# Patient Record
Sex: Female | Born: 1935
Health system: Southern US, Community
[De-identification: ages and names within clinical notes are randomized; demographics above are authoritative.]

## PROBLEM LIST (undated history)

## (undated) DIAGNOSIS — I5032 Chronic diastolic (congestive) heart failure: Secondary | ICD-10-CM

## (undated) DIAGNOSIS — I10 Essential (primary) hypertension: Secondary | ICD-10-CM

## (undated) DIAGNOSIS — R21 Rash and other nonspecific skin eruption: Secondary | ICD-10-CM

## (undated) DIAGNOSIS — E785 Hyperlipidemia, unspecified: Secondary | ICD-10-CM

## (undated) DIAGNOSIS — E039 Hypothyroidism, unspecified: Secondary | ICD-10-CM

## (undated) DIAGNOSIS — R0602 Shortness of breath: Secondary | ICD-10-CM

## (undated) DIAGNOSIS — I2510 Atherosclerotic heart disease of native coronary artery without angina pectoris: Secondary | ICD-10-CM

## (undated) DIAGNOSIS — T7840XA Allergy, unspecified, initial encounter: Secondary | ICD-10-CM

## (undated) DIAGNOSIS — I350 Nonrheumatic aortic (valve) stenosis: Secondary | ICD-10-CM

## (undated) DIAGNOSIS — J9600 Acute respiratory failure, unspecified whether with hypoxia or hypercapnia: Secondary | ICD-10-CM

## (undated) DIAGNOSIS — M1990 Unspecified osteoarthritis, unspecified site: Secondary | ICD-10-CM

## (undated) DIAGNOSIS — I480 Paroxysmal atrial fibrillation: Secondary | ICD-10-CM

## (undated) HISTORY — PX: CORONARY ANGIOPLASTY WITH STENT PLACEMENT: SHX49

## (undated) HISTORY — PX: LUMBAR DISC SURGERY: SHX700

## (undated) HISTORY — DX: Hyperlipidemia, unspecified: E78.5

## (undated) HISTORY — PX: SHOULDER ACROMIOPLASTY: SHX6093

## (undated) HISTORY — PX: BACK SURGERY: SHX140

## (undated) HISTORY — PX: WRIST FRACTURE SURGERY: SHX121

## (undated) HISTORY — PX: APPENDECTOMY: SHX54

## (undated) HISTORY — PX: ANGIOPLASTY: SHX39

## (undated) HISTORY — DX: Hypothyroidism, unspecified: E03.9

## (undated) HISTORY — DX: Essential (primary) hypertension: I10

## (undated) HISTORY — DX: Paroxysmal atrial fibrillation: I48.0

## (undated) HISTORY — PX: FRACTURE SURGERY: SHX138

## (undated) HISTORY — DX: Unspecified osteoarthritis, unspecified site: M19.90

## (undated) HISTORY — PX: EXCISIONAL HEMORRHOIDECTOMY: SHX1541

## (undated) HISTORY — PX: DILATION AND CURETTAGE OF UTERUS: SHX78

## (undated) HISTORY — DX: Allergy, unspecified, initial encounter: T78.40XA

## (undated) HISTORY — PX: CATARACT EXTRACTION W/ INTRAOCULAR LENS  IMPLANT, BILATERAL: SHX1307

---

## 1998-05-27 LAB — HM SIGMOIDOSCOPY: HM Sigmoidoscopy: NEGATIVE

## 1999-10-22 ENCOUNTER — Emergency Department (HOSPITAL_COMMUNITY): Admission: EM | Admit: 1999-10-22 | Discharge: 1999-10-22 | Payer: Self-pay | Admitting: Emergency Medicine

## 1999-10-22 ENCOUNTER — Encounter: Payer: Self-pay | Admitting: Emergency Medicine

## 1999-10-22 ENCOUNTER — Encounter: Payer: Self-pay | Admitting: Neurological Surgery

## 1999-10-22 ENCOUNTER — Ambulatory Visit (HOSPITAL_COMMUNITY): Admission: RE | Admit: 1999-10-22 | Discharge: 1999-10-22 | Payer: Self-pay | Admitting: Neurological Surgery

## 1999-12-20 ENCOUNTER — Encounter: Admission: RE | Admit: 1999-12-20 | Discharge: 1999-12-20 | Payer: Self-pay | Admitting: Family Medicine

## 1999-12-20 ENCOUNTER — Encounter: Payer: Self-pay | Admitting: Family Medicine

## 1999-12-23 ENCOUNTER — Inpatient Hospital Stay (HOSPITAL_COMMUNITY): Admission: EM | Admit: 1999-12-23 | Discharge: 1999-12-25 | Payer: Self-pay | Admitting: Emergency Medicine

## 2001-01-09 ENCOUNTER — Encounter: Payer: Self-pay | Admitting: Family Medicine

## 2001-01-09 ENCOUNTER — Encounter: Admission: RE | Admit: 2001-01-09 | Discharge: 2001-01-09 | Payer: Self-pay | Admitting: Family Medicine

## 2001-05-27 LAB — HM COLONOSCOPY: HM Colonoscopy: NEGATIVE

## 2001-11-26 ENCOUNTER — Ambulatory Visit (HOSPITAL_COMMUNITY): Admission: RE | Admit: 2001-11-26 | Discharge: 2001-11-26 | Payer: Self-pay | Admitting: Cardiology

## 2002-02-09 ENCOUNTER — Ambulatory Visit (HOSPITAL_COMMUNITY): Admission: RE | Admit: 2002-02-09 | Discharge: 2002-02-09 | Payer: Self-pay | Admitting: Gastroenterology

## 2004-08-01 ENCOUNTER — Emergency Department (HOSPITAL_COMMUNITY): Admission: EM | Admit: 2004-08-01 | Discharge: 2004-08-02 | Payer: Self-pay | Admitting: Emergency Medicine

## 2004-10-26 ENCOUNTER — Encounter: Admission: RE | Admit: 2004-10-26 | Discharge: 2004-10-26 | Payer: Self-pay | Admitting: Family Medicine

## 2005-12-10 ENCOUNTER — Ambulatory Visit: Payer: Self-pay | Admitting: Family Medicine

## 2006-02-11 ENCOUNTER — Ambulatory Visit: Payer: Self-pay | Admitting: Family Medicine

## 2006-12-15 ENCOUNTER — Ambulatory Visit: Payer: Self-pay | Admitting: Family Medicine

## 2006-12-22 ENCOUNTER — Ambulatory Visit: Payer: Self-pay | Admitting: Family Medicine

## 2007-12-07 ENCOUNTER — Ambulatory Visit: Payer: Self-pay | Admitting: Family Medicine

## 2008-06-13 ENCOUNTER — Ambulatory Visit: Payer: Self-pay | Admitting: Diagnostic Radiology

## 2008-06-13 ENCOUNTER — Inpatient Hospital Stay (HOSPITAL_COMMUNITY): Admission: EM | Admit: 2008-06-13 | Discharge: 2008-06-27 | Payer: Self-pay | Admitting: Emergency Medicine

## 2008-06-13 ENCOUNTER — Encounter: Payer: Self-pay | Admitting: Emergency Medicine

## 2008-06-14 ENCOUNTER — Encounter (INDEPENDENT_AMBULATORY_CARE_PROVIDER_SITE_OTHER): Payer: Self-pay | Admitting: Cardiology

## 2008-06-23 ENCOUNTER — Encounter: Payer: Self-pay | Admitting: Orthopedic Surgery

## 2008-08-22 ENCOUNTER — Emergency Department (HOSPITAL_BASED_OUTPATIENT_CLINIC_OR_DEPARTMENT_OTHER): Admission: EM | Admit: 2008-08-22 | Discharge: 2008-08-22 | Payer: Self-pay | Admitting: Emergency Medicine

## 2008-08-22 ENCOUNTER — Ambulatory Visit: Payer: Self-pay | Admitting: Radiology

## 2008-12-31 ENCOUNTER — Encounter: Payer: Self-pay | Admitting: Emergency Medicine

## 2008-12-31 ENCOUNTER — Ambulatory Visit: Payer: Self-pay | Admitting: Interventional Radiology

## 2008-12-31 ENCOUNTER — Inpatient Hospital Stay (HOSPITAL_COMMUNITY): Admission: EM | Admit: 2008-12-31 | Discharge: 2009-01-01 | Payer: Self-pay | Admitting: Cardiology

## 2009-04-03 ENCOUNTER — Inpatient Hospital Stay (HOSPITAL_COMMUNITY): Admission: EM | Admit: 2009-04-03 | Discharge: 2009-04-09 | Payer: Self-pay | Admitting: Emergency Medicine

## 2009-04-07 HISTORY — PX: CARDIAC CATHETERIZATION: SHX172

## 2009-04-27 ENCOUNTER — Encounter (HOSPITAL_COMMUNITY): Admission: RE | Admit: 2009-04-27 | Discharge: 2009-05-23 | Payer: Self-pay | Admitting: Cardiology

## 2009-05-12 ENCOUNTER — Ambulatory Visit: Payer: Self-pay | Admitting: Family Medicine

## 2009-10-02 ENCOUNTER — Encounter: Admission: RE | Admit: 2009-10-02 | Discharge: 2009-10-02 | Payer: Self-pay | Admitting: Cardiology

## 2009-10-06 ENCOUNTER — Ambulatory Visit (HOSPITAL_COMMUNITY): Admission: RE | Admit: 2009-10-06 | Discharge: 2009-10-06 | Payer: Self-pay | Admitting: Cardiology

## 2009-10-06 HISTORY — PX: CARDIAC CATHETERIZATION: SHX172

## 2009-11-27 ENCOUNTER — Ambulatory Visit: Payer: Self-pay | Admitting: Diagnostic Radiology

## 2009-11-29 ENCOUNTER — Ambulatory Visit: Payer: Self-pay | Admitting: Family Medicine

## 2010-04-05 ENCOUNTER — Ambulatory Visit: Payer: Self-pay | Admitting: Family Medicine

## 2010-05-03 ENCOUNTER — Emergency Department (HOSPITAL_BASED_OUTPATIENT_CLINIC_OR_DEPARTMENT_OTHER): Admission: EM | Admit: 2010-05-03 | Discharge: 2009-11-27 | Payer: Self-pay | Admitting: Emergency Medicine

## 2010-06-29 LAB — HM MAMMOGRAPHY: HM Mammogram: NEGATIVE

## 2010-07-10 ENCOUNTER — Ambulatory Visit (HOSPITAL_COMMUNITY)
Admission: RE | Admit: 2010-07-10 | Discharge: 2010-07-10 | Disposition: A | Discharge status: Home / Self Care | Payer: Medicare Other | Source: Ambulatory Visit | Attending: Cardiovascular Disease | Admitting: Cardiovascular Disease

## 2010-07-10 ENCOUNTER — Ambulatory Visit (HOSPITAL_COMMUNITY): Payer: Medicare Other

## 2010-07-10 DIAGNOSIS — I4891 Unspecified atrial fibrillation: Secondary | ICD-10-CM | POA: Insufficient documentation

## 2010-07-10 LAB — URINALYSIS, MICROSCOPIC ONLY
Bilirubin Urine: NEGATIVE
Ketones, ur: NEGATIVE mg/dL
Nitrite: NEGATIVE
Urine Glucose, Fasting: NEGATIVE mg/dL
Urobilinogen, UA: 0.2 mg/dL (ref 0.0–1.0)
pH: 7 (ref 5.0–8.0)

## 2010-07-10 LAB — COMPREHENSIVE METABOLIC PANEL
AST: 22 U/L (ref 0–37)
Alkaline Phosphatase: 61 U/L (ref 39–117)
BUN: 22 mg/dL (ref 6–23)
Calcium: 9.2 mg/dL (ref 8.4–10.5)
Chloride: 110 mEq/L (ref 96–112)
GFR calc Af Amer: >60 mL/min (ref >60)
Glucose, Bld: 103 mg/dL — ABNORMAL HIGH (ref 70–99)
Total Bilirubin: 1.1 mg/dL (ref 0.3–1.2)

## 2010-07-10 LAB — CBC
MCHC: 33 g/dL (ref 30.0–36.0)
RDW: 13.9 % (ref 11.5–15.5)

## 2010-07-10 LAB — APTT: aPTT: 34 seconds (ref 24–37)

## 2010-07-10 LAB — TSH: TSH: 1.015 u[IU]/mL (ref 0.350–4.500)

## 2010-07-11 HISTORY — PX: CARDIOVERSION: SHX1299

## 2010-07-16 NOTE — Op Note (Signed)
  NAMEKENSLEI, HEARTY               ACCOUNT NO.:  [id number]  MEDICAL RECORD NO.:  [id number]           PATIENT TYPE:  LOCATION:                                 FACILITY:  PHYSICIAN:  Thurmon Fair, MD     DATE OF BIRTH:  [birth year]  DATE OF PROCEDURE: DATE OF DISCHARGE:                              OPERATIVE REPORT   PROCEDURE PERFORMED:  Synchronized cardioversion.  PREOPERATIVE DIAGNOSIS:  Symptomatic persistent atrial fibrillation.  POSTOPERATIVE DIAGNOSIS:  Successful cardioversion with restoration of normal sinus rhythm.  Deep sedation with intravenous propofol was administered via the Anesthesiology Services (Dr. Randa Evens).  Mrs. [name] is an 77 year old woman with known coronary artery disease and recurrent paroxysmal atrial fibrillation.  She has had a persistent episode of atrial fibrillation, lasting more than 24 hours despite increasing doses of sotalol.  After risk and benefits of cardioversion were discussed, the patient provided informed consent.  A single synchronized 120 joules biphasic shock was administered via anterior and posterior chest pads with conversion to sinus bradycardia with a rate of approximately 56 beats per minute.  The post conversion corrected.  QT interval was approximately 500 milliseconds suggestion that the dose of sotalol needs to be reduced. The patient will be discharged on sotalol 80 mg in the morning and 40 mg in the afternoon.  A follow-up electrocardiogram and prothrombin time was performed tomorrow.  The patient received a single dose of Lovenox while in the hospital for a mildly subtherapeutic level of anticoagulation.     Thurmon Fair, MD     MC/MEDQ  D:  07/10/2010  T:  07/11/2010  Job:  [id number]  cc:   Thereasa Solo. Little, M.D.  Electronically Signed by Thurmon Fair M.D. on 07/16/2010 08:35:44 AM

## 2010-08-14 LAB — PROTIME-INR
INR: 1.35 (ref 0.00–1.49)
Prothrombin Time: 16.6 seconds — ABNORMAL HIGH (ref 11.6–15.2)

## 2010-08-29 LAB — BASIC METABOLIC PANEL
BUN: 13 mg/dL (ref 6–23)
BUN: 13 mg/dL (ref 6–23)
BUN: 21 mg/dL (ref 6–23)
BUN: 22 mg/dL (ref 6–23)
CO2: 23 mEq/L (ref 19–32)
CO2: 24 mEq/L (ref 19–32)
CO2: 26 mEq/L (ref 19–32)
CO2: 26 mEq/L (ref 19–32)
CO2: 27 mEq/L (ref 19–32)
CO2: 27 mEq/L (ref 19–32)
Calcium: 8.5 mg/dL (ref 8.4–10.5)
Calcium: 8.7 mg/dL (ref 8.4–10.5)
Calcium: 8.7 mg/dL (ref 8.4–10.5)
Chloride: 107 mEq/L (ref 96–112)
Chloride: 108 mEq/L (ref 96–112)
Chloride: 109 mEq/L (ref 96–112)
Chloride: 109 mEq/L (ref 96–112)
Chloride: 110 mEq/L (ref 96–112)
Creatinine, Ser: 0.85 mg/dL (ref 0.4–1.2)
Creatinine, Ser: 0.87 mg/dL (ref 0.4–1.2)
Creatinine, Ser: 1.08 mg/dL (ref 0.4–1.2)
GFR calc Af Amer: 54 mL/min — ABNORMAL LOW (ref >60)
GFR calc Af Amer: >60 mL/min (ref >60)
GFR calc Af Amer: >60 mL/min (ref >60)
GFR calc Af Amer: >60 mL/min (ref >60)
GFR calc non Af Amer: 51 mL/min — ABNORMAL LOW (ref >60)
GFR calc non Af Amer: 52 mL/min — ABNORMAL LOW (ref >60)
GFR calc non Af Amer: >60 mL/min (ref >60)
Glucose, Bld: 106 mg/dL — ABNORMAL HIGH (ref 70–99)
Glucose, Bld: 111 mg/dL — ABNORMAL HIGH (ref 70–99)
Glucose, Bld: 121 mg/dL — ABNORMAL HIGH (ref 70–99)
Glucose, Bld: 92 mg/dL (ref 70–99)
Glucose, Bld: 92 mg/dL (ref 70–99)
Potassium: 3.3 mEq/L — ABNORMAL LOW (ref 3.5–5.1)
Potassium: 3.4 mEq/L — ABNORMAL LOW (ref 3.5–5.1)
Potassium: 3.5 mEq/L (ref 3.5–5.1)
Potassium: 3.8 mEq/L (ref 3.5–5.1)
Potassium: 3.8 mEq/L (ref 3.5–5.1)
Sodium: 139 mEq/L (ref 135–145)
Sodium: 140 mEq/L (ref 135–145)
Sodium: 141 mEq/L (ref 135–145)
Sodium: 141 mEq/L (ref 135–145)
Sodium: 141 mEq/L (ref 135–145)
Sodium: 142 mEq/L (ref 135–145)

## 2010-08-29 LAB — CBC
HCT: 28.4 % — ABNORMAL LOW (ref 36.0–46.0)
HCT: 29.9 % — ABNORMAL LOW (ref 36.0–46.0)
HCT: 30.9 % — ABNORMAL LOW (ref 36.0–46.0)
HCT: 31.1 % — ABNORMAL LOW (ref 36.0–46.0)
HCT: 31.4 % — ABNORMAL LOW (ref 36.0–46.0)
HCT: 32.3 % — ABNORMAL LOW (ref 36.0–46.0)
HCT: 36.6 % (ref 36.0–46.0)
Hemoglobin: 10.1 g/dL — ABNORMAL LOW (ref 12.0–15.0)
Hemoglobin: 10.6 g/dL — ABNORMAL LOW (ref 12.0–15.0)
Hemoglobin: 10.6 g/dL — ABNORMAL LOW (ref 12.0–15.0)
Hemoglobin: 9.6 g/dL — ABNORMAL LOW (ref 12.0–15.0)
MCHC: 33.5 g/dL (ref 30.0–36.0)
MCHC: 33.8 g/dL (ref 30.0–36.0)
MCHC: 34 g/dL (ref 30.0–36.0)
MCHC: 34 g/dL (ref 30.0–36.0)
MCV: 89.9 fL (ref 78.0–100.0)
MCV: 90.6 fL (ref 78.0–100.0)
MCV: 90.7 fL (ref 78.0–100.0)
MCV: 91 fL (ref 78.0–100.0)
MCV: 91.6 fL (ref 78.0–100.0)
Platelets: 223 10*3/uL (ref 150–400)
Platelets: 259 10*3/uL (ref 150–400)
Platelets: 264 10*3/uL (ref 150–400)
RBC: 3.27 MIL/uL — ABNORMAL LOW (ref 3.87–5.11)
RBC: 3.45 MIL/uL — ABNORMAL LOW (ref 3.87–5.11)
RDW: 14.1 % (ref 11.5–15.5)
RDW: 14.9 % (ref 11.5–15.5)
RDW: 14.9 % (ref 11.5–15.5)
RDW: 15.1 % (ref 11.5–15.5)
WBC: 7.7 10*3/uL (ref 4.0–10.5)
WBC: 8.4 10*3/uL (ref 4.0–10.5)

## 2010-08-29 LAB — CARDIAC PANEL(CRET KIN+CKTOT+MB+TROPI)
CK, MB: 1 ng/mL (ref 0.3–4.0)
Total CK: 23 U/L (ref 7–177)
Troponin I: 0.05 ng/mL (ref 0.00–0.06)
Troponin I: 0.06 ng/mL (ref 0.00–0.06)

## 2010-08-29 LAB — POCT I-STAT 3, ART BLOOD GAS (G3+)
Bicarbonate: 24.8 mEq/L — ABNORMAL HIGH (ref 20.0–24.0)
O2 Saturation: 95 %
pCO2 arterial: 37.9 mmHg (ref 35.0–45.0)
pO2, Arterial: 74 mmHg — ABNORMAL LOW (ref 80.0–100.0)

## 2010-08-29 LAB — DIFFERENTIAL
Basophils Absolute: 0.1 10*3/uL (ref 0.0–0.1)
Eosinophils Absolute: 0 10*3/uL (ref 0.0–0.7)
Eosinophils Absolute: 0 10*3/uL (ref 0.0–0.7)
Eosinophils Relative: 0 % (ref 0–5)
Lymphocytes Relative: 15 % (ref 12–46)
Monocytes Absolute: 1.1 10*3/uL — ABNORMAL HIGH (ref 0.1–1.0)
Monocytes Absolute: 1.2 10*3/uL — ABNORMAL HIGH (ref 0.1–1.0)
Monocytes Relative: 8 % (ref 3–12)
Neutrophils Relative %: 77 % (ref 43–77)

## 2010-08-29 LAB — HEMOGLOBIN: Hemoglobin: 10.7 g/dL — ABNORMAL LOW (ref 12.0–15.0)

## 2010-08-29 LAB — POCT CARDIAC MARKERS
Myoglobin, poc: 104 ng/mL (ref 12–200)
Troponin i, poc: <0.05 ng/mL (ref 0.00–0.09)

## 2010-08-29 LAB — POCT I-STAT 3, VENOUS BLOOD GAS (G3P V)
Bicarbonate: 23.4 mEq/L (ref 20.0–24.0)
O2 Saturation: 69 %
pCO2, Ven: 42.5 mmHg — ABNORMAL LOW (ref 45.0–50.0)
pO2, Ven: 38 mmHg (ref 30.0–45.0)

## 2010-08-29 LAB — CK TOTAL AND CKMB (NOT AT ARMC)
CK, MB: 3.9 ng/mL (ref 0.3–4.0)
CK, MB: 5.2 ng/mL — ABNORMAL HIGH (ref 0.3–4.0)
Relative Index: 5.1 — ABNORMAL HIGH (ref 0.0–2.5)
Relative Index: INVALID (ref 0.0–2.5)

## 2010-08-29 LAB — PROTIME-INR
INR: 1.82 — ABNORMAL HIGH (ref 0.00–1.49)
INR: 1.89 — ABNORMAL HIGH (ref 0.00–1.49)
INR: 2.08 — ABNORMAL HIGH (ref 0.00–1.49)
Prothrombin Time: 20 seconds — ABNORMAL HIGH (ref 11.6–15.2)
Prothrombin Time: 20.9 seconds — ABNORMAL HIGH (ref 11.6–15.2)

## 2010-08-29 LAB — APTT: aPTT: 42 seconds — ABNORMAL HIGH (ref 24–37)

## 2010-08-29 LAB — MAGNESIUM: Magnesium: 2.1 mg/dL (ref 1.5–2.5)

## 2010-08-29 LAB — TROPONIN I: Troponin I: 0.17 ng/mL — ABNORMAL HIGH (ref 0.00–0.06)

## 2010-08-29 LAB — BRAIN NATRIURETIC PEPTIDE: Pro B Natriuretic peptide (BNP): 239 pg/mL — ABNORMAL HIGH (ref 0.0–100.0)

## 2010-09-01 LAB — LIPID PANEL
Cholesterol: 140 mg/dL (ref 0–200)
LDL Cholesterol: 83 mg/dL (ref 0–99)
Triglycerides: 67 mg/dL (ref <150)
VLDL: 13 mg/dL (ref 0–40)

## 2010-09-01 LAB — PROTIME-INR: INR: 2.8 — ABNORMAL HIGH (ref 0.00–1.49)

## 2010-09-01 LAB — CARDIAC PANEL(CRET KIN+CKTOT+MB+TROPI)
CK, MB: 2 ng/mL (ref 0.3–4.0)
CK, MB: 2.1 ng/mL (ref 0.3–4.0)
Relative Index: INVALID (ref 0.0–2.5)
Total CK: 86 U/L (ref 7–177)
Troponin I: 0.01 ng/mL (ref 0.00–0.06)
Troponin I: 0.04 ng/mL (ref 0.00–0.06)

## 2010-09-01 LAB — CBC
Hemoglobin: 12.8 g/dL (ref 12.0–15.0)
RDW: 14.5 % (ref 11.5–15.5)

## 2010-09-01 LAB — BASIC METABOLIC PANEL
BUN: 15 mg/dL (ref 6–23)
BUN: 16 mg/dL (ref 6–23)
CO2: 23 mEq/L (ref 19–32)
Calcium: 9 mg/dL (ref 8.4–10.5)
Calcium: 9.3 mg/dL (ref 8.4–10.5)
Chloride: 107 mEq/L (ref 96–112)
Creatinine, Ser: 0.85 mg/dL (ref 0.4–1.2)
Creatinine, Ser: 0.96 mg/dL (ref 0.4–1.2)
Creatinine, Ser: 1 mg/dL (ref 0.4–1.2)
GFR calc Af Amer: >60 mL/min (ref >60)
GFR calc non Af Amer: 53 mL/min — ABNORMAL LOW (ref >60)
Glucose, Bld: 92 mg/dL (ref 70–99)
Glucose, Bld: 107 mg/dL — ABNORMAL HIGH (ref 70–99)
Sodium: 147 mEq/L — ABNORMAL HIGH (ref 135–145)

## 2010-09-01 LAB — URINALYSIS, ROUTINE W REFLEX MICROSCOPIC
Leukocytes, UA: NEGATIVE
Nitrite: NEGATIVE
Protein, ur: 30 mg/dL — AB
Specific Gravity, Urine: 1.008 (ref 1.005–1.030)
Urobilinogen, UA: 0.2 mg/dL (ref 0.0–1.0)

## 2010-09-01 LAB — TSH: TSH: 0.405 u[IU]/mL (ref 0.350–4.500)

## 2010-09-01 LAB — DIFFERENTIAL
Basophils Absolute: 0 10*3/uL (ref 0.0–0.1)
Basophils Relative: 1 % (ref 0–1)
Lymphocytes Relative: 20 % (ref 12–46)
Monocytes Absolute: 0.6 10*3/uL (ref 0.1–1.0)
Neutro Abs: 4.2 10*3/uL (ref 1.7–7.7)
Neutrophils Relative %: 69 % (ref 43–77)

## 2010-09-01 LAB — MAGNESIUM: Magnesium: 2.3 mg/dL (ref 1.5–2.5)

## 2010-09-01 LAB — BRAIN NATRIURETIC PEPTIDE: Pro B Natriuretic peptide (BNP): 360 pg/mL — ABNORMAL HIGH (ref 0.0–100.0)

## 2010-09-01 LAB — POCT CARDIAC MARKERS
Myoglobin, poc: 59.3 ng/mL (ref 12–200)
Troponin i, poc: <0.05 ng/mL (ref 0.00–0.09)

## 2010-09-01 LAB — URINE MICROSCOPIC-ADD ON

## 2010-09-01 LAB — URINE CULTURE

## 2010-09-06 LAB — URINE MICROSCOPIC-ADD ON

## 2010-09-06 LAB — BASIC METABOLIC PANEL
BUN: 23 mg/dL (ref 6–23)
Calcium: 9.4 mg/dL (ref 8.4–10.5)
Chloride: 107 mEq/L (ref 96–112)
Creatinine, Ser: 1.2 mg/dL (ref 0.4–1.2)
GFR calc Af Amer: 52 mL/min — ABNORMAL LOW (ref >60)

## 2010-09-06 LAB — POCT CARDIAC MARKERS: Myoglobin, poc: 73 ng/mL (ref 12–200)

## 2010-09-06 LAB — URINALYSIS, ROUTINE W REFLEX MICROSCOPIC
Bilirubin Urine: NEGATIVE
Glucose, UA: NEGATIVE mg/dL
Specific Gravity, Urine: 1.014 (ref 1.005–1.030)
Urobilinogen, UA: 0.2 mg/dL (ref 0.0–1.0)
pH: 7 (ref 5.0–8.0)

## 2010-09-06 LAB — CBC
HCT: 39.3 % (ref 36.0–46.0)
Hemoglobin: 12.9 g/dL (ref 12.0–15.0)
MCV: 88.2 fL (ref 78.0–100.0)
Platelets: 279 10*3/uL (ref 150–400)
WBC: 7.1 10*3/uL (ref 4.0–10.5)

## 2010-09-06 LAB — DIFFERENTIAL
Eosinophils Absolute: 0 10*3/uL (ref 0.0–0.7)
Eosinophils Relative: 1 % (ref 0–5)
Lymphocytes Relative: 27 % (ref 12–46)
Lymphs Abs: 1.9 10*3/uL (ref 0.7–4.0)
Monocytes Absolute: 0.5 10*3/uL (ref 0.1–1.0)
Monocytes Relative: 8 % (ref 3–12)

## 2010-09-06 LAB — PROTIME-INR: Prothrombin Time: 29.7 seconds — ABNORMAL HIGH (ref 11.6–15.2)

## 2010-09-10 LAB — PROTIME-INR
INR: 1.1 (ref 0.00–1.49)
INR: 1.2 (ref 0.00–1.49)
INR: 1.6 — ABNORMAL HIGH (ref 0.00–1.49)
INR: 1.7 — ABNORMAL HIGH (ref 0.00–1.49)
Prothrombin Time: 24.3 seconds — ABNORMAL HIGH (ref 11.6–15.2)
Prothrombin Time: 26.7 seconds — ABNORMAL HIGH (ref 11.6–15.2)
Prothrombin Time: 22.1 seconds — ABNORMAL HIGH (ref 11.6–15.2)

## 2010-09-10 LAB — CBC
HCT: 31.9 % — ABNORMAL LOW (ref 36.0–46.0)
HCT: 32.4 % — ABNORMAL LOW (ref 36.0–46.0)
HCT: 33.5 % — ABNORMAL LOW (ref 36.0–46.0)
HCT: 33.9 % — ABNORMAL LOW (ref 36.0–46.0)
Hemoglobin: 10.9 g/dL — ABNORMAL LOW (ref 12.0–15.0)
Hemoglobin: 10.9 g/dL — ABNORMAL LOW (ref 12.0–15.0)
Hemoglobin: 9.5 g/dL — ABNORMAL LOW (ref 12.0–15.0)
MCHC: 34.1 g/dL (ref 30.0–36.0)
MCV: 89.4 fL (ref 78.0–100.0)
MCV: 90 fL (ref 78.0–100.0)
MCV: 90.2 fL (ref 78.0–100.0)
MCV: 90.5 fL (ref 78.0–100.0)
Platelets: 210 10*3/uL (ref 150–400)
Platelets: 211 10*3/uL (ref 150–400)
Platelets: 223 10*3/uL (ref 150–400)
Platelets: 351 10*3/uL (ref 150–400)
RBC: 3.13 MIL/uL — ABNORMAL LOW (ref 3.87–5.11)
RBC: 3.57 MIL/uL — ABNORMAL LOW (ref 3.87–5.11)
RDW: 13.5 % (ref 11.5–15.5)
RDW: 13.6 % (ref 11.5–15.5)
RDW: 13.7 % (ref 11.5–15.5)
WBC: 11.3 10*3/uL — ABNORMAL HIGH (ref 4.0–10.5)

## 2010-09-10 LAB — BASIC METABOLIC PANEL
BUN: 13 mg/dL (ref 6–23)
BUN: 13 mg/dL (ref 6–23)
BUN: 14 mg/dL (ref 6–23)
CO2: 25 mEq/L (ref 19–32)
Chloride: 105 mEq/L (ref 96–112)
Chloride: 106 mEq/L (ref 96–112)
Chloride: 106 mEq/L (ref 96–112)
Chloride: 106 mEq/L (ref 96–112)
Creatinine, Ser: 0.87 mg/dL (ref 0.4–1.2)
GFR calc Af Amer: >60 mL/min (ref >60)
GFR calc non Af Amer: >60 mL/min (ref >60)
GFR calc non Af Amer: >60 mL/min (ref >60)
Glucose, Bld: 100 mg/dL — ABNORMAL HIGH (ref 70–99)
Glucose, Bld: 104 mg/dL — ABNORMAL HIGH (ref 70–99)
Glucose, Bld: 111 mg/dL — ABNORMAL HIGH (ref 70–99)
Potassium: 3.4 mEq/L — ABNORMAL LOW (ref 3.5–5.1)
Potassium: 3.6 mEq/L (ref 3.5–5.1)
Potassium: 4.5 mEq/L (ref 3.5–5.1)
Sodium: 139 mEq/L (ref 135–145)
Sodium: 140 mEq/L (ref 135–145)

## 2010-09-10 LAB — COMPREHENSIVE METABOLIC PANEL
Albumin: 3.2 g/dL — ABNORMAL LOW (ref 3.5–5.2)
Albumin: 3.7 g/dL (ref 3.5–5.2)
Alkaline Phosphatase: 65 U/L (ref 39–117)
BUN: 10 mg/dL (ref 6–23)
BUN: 12 mg/dL (ref 6–23)
CO2: 27 mEq/L (ref 19–32)
Calcium: 8.8 mg/dL (ref 8.4–10.5)
Chloride: 105 mEq/L (ref 96–112)
Creatinine, Ser: 0.82 mg/dL (ref 0.4–1.2)
GFR calc Af Amer: >60 mL/min (ref >60)
Potassium: 3.1 mEq/L — ABNORMAL LOW (ref 3.5–5.1)
Total Bilirubin: 0.9 mg/dL (ref 0.3–1.2)
Total Bilirubin: 1.2 mg/dL (ref 0.3–1.2)
Total Protein: 7.3 g/dL (ref 6.0–8.3)

## 2010-09-10 LAB — PREPARE FRESH FROZEN PLASMA

## 2010-10-09 NOTE — Discharge Summary (Signed)
[name], [name]               ACCOUNT NO.:  [id number]   MEDICAL RECORD NO.:  [id number]          PATIENT TYPE:  INP   LOCATION:  4710                         FACILITY:  [facility]   PHYSICIAN:  Sheliah Mends, MD      DATE OF BIRTH:  Nov 20, 1923   DATE OF ADMISSION:  12/31/2008  DATE OF DISCHARGE:  01/01/2009                               DISCHARGE SUMMARY   DISCHARGE DIAGNOSES:  1. Paroxysmal atrial fibrillation.  2. Intermittent left bundle-branch block.  3. Sinus brady.  4. Hypertension.  5. Normal ventricular function.  6. History of valvular disease, moderate mitral regurgitation and mild-      to-moderate aortic valve disease with some aortic insufficiency.  7. Hypothyroidism.  8. Dyslipidemia.  9. Mild interstitial edema by chest x-ray at the time of admission.  10.Anticoagulation with Coumadin for her paroxysmal atrial      fibrillation.   DISCHARGE MEDICATIONS:  1. Amiodarone 200 mg once per day.  2. Benazepril 20 mg twice per day.  3. She is to decrease her metoprolol to 25 mg half a tabs twice per      day.  4. Hydralazine 10 mg twice per day.  5. She should stop taking her ibuprofen because of some labile blood      pressure.  6. Furosemide 40 mg once a day.  7. Synthroid 88 mcg a day.  8. Warfarin 2.5 mg daily except 1.25 mg on Tuesday and Friday.  9. Zetia 10 mg a day.  10.Lipitor 40 mg a day.   LABORATORY DATA:  CK-MB and troponins were negative.  BNP on January 01, 2009, it was 156.  Her sodium was 141, potassium 4.7, chloride 111, CO2  of  23, glucose 92, BUN 15, creatinine 0.96.  Hemoglobin 12.9,  hematocrit 39.3, WBC 7.1, and platelets 279.  INR on admission was 2.6.  Urinalysis was negative for any pathology.  Magnesium was 2.3.  INR on  December 31, 2008 was 2.8 and on January 01, 2009, it was 2.7.  Total  cholesterol was 140, triglycerides 67, HDL 44, and LDL 83.  TSH was  0.405.  Chest x-ray on December 31, 2008 showed cardiomegaly with mild  interstitial  edema, it is compared  August 22, 2008.   HOSPITAL COURSE:  [name] is patient of Dr. Julieanne Manson and  Dr. Susann Givens.  She has a prior medical history of atrial fib.  She came  into the [facility] in [place] with complaints of pounding heart  beat.  She states about 10 o'clock at night, she was watching TV and her  heart started beating hard.  It felt like a drum pounding.  She took an  extra amiodarone and lay down on bed; however, at 11:30, it had not  changed thus she asked her husband to take her for a medical care.  She  apparently was in atrial fib, rate in the 90s.  She usually has sinus-  brady problems with heart rate in the 40s to 50s, when she is in sinus  rhythm.  She also had a  left bundle-branch block present.  It was  decided that she should be transferred for admission.  Thus, she came to  [facility] on December 31, 2008.  She was monitored.  She was back in sinus  rhythm.  She remained in sinus rhythm during her hospital stay.  Her  enzymes were negative.  We adjusted her medications.  We had to decrease  her metoprolol because of her slow heart rate in the 40s to 50s.  It did  drop down to 39, I believe.  It was not sustained now.  She was not  dizzy or orthostatic.  Her blood pressure was concerned at the time of  discharge.  We added hydralazine to her medications.  She walked up and  down in the hall.  Blood pressure was quite elevated with walking and  after walk, heart rate was 55, her blood pressure was 182/81 and 187/77  after walking.  She was seen by Dr. Garen Lah, considered stable for  discharge home.      Lezlie Octave, N.P.      Sheliah Mends, MD  Electronically Signed    BB/MEDQ  D:  01/01/2009  T:  01/02/2009  Job:  [id number]   cc:   Sharlot Gowda, M.D.

## 2010-10-09 NOTE — Op Note (Signed)
[name], [name]               ACCOUNT NO.:  [id number]   MEDICAL RECORD NO.:  [id number]          PATIENT TYPE:  INP   LOCATION:  5036                         FACILITY:  [facility]   PHYSICIAN:  Madelynn Done, MD  DATE OF BIRTH:  02/16/1924   DATE OF PROCEDURE:  06/15/2008  DATE OF DISCHARGE:                               OPERATIVE REPORT   PREOPERATIVE DIAGNOSES:  1. Comminuted left intra-articular distal radius fracture, former      fragments.  2. Left distal ulnar metaphyseal fracture and ulnar styloid fracture.  3. Left scaphoid fracture.   POSTOPERATIVE DIAGNOSES:  1. Comminuted left intra-articular distal radius fracture, former      fragments.  2. Left distal ulnar metaphyseal fracture and ulnar styloid fracture.  3. Left scaphoid fracture.   ATTENDING PHYSICIAN:  Madelynn Done, MD, who scrubbed and present  for the entire procedure.   ASSISTANT SURGEON:  None.   SURGICAL PROCEDURES:  1. Open treatment of left intra-articular distal radius fracture      former fragments of the internal fixation and allograft bone      grafting.  2. Closed treatment of left ulnar shaft, metaphyseal fracture without      internal fixation.  3. Open reduction and internal fixation, left scaphoid fracture.  4. Radiographs, stress radiography of the left wrist.   SURGICAL IMPLANT:  1. Synthes volar distal radius plate with 4 locking pegs and two 2.4-      mm screws distally with three screws proximally.  2. One standard Acutrak screw.   ANESTHESIA:  General via endotracheal tube.   TOURNIQUET TIME:  1 hour and 20 minutes to 250 mmHg.   SURGICAL DRAINS:  One #70 TLS drain.   SURGICAL INDICATIONS:  [name] is an 77 year old female who fell  off ladder landing on her outstretched left upper extremity.  The  patient sustained a comminuted fracture to the left distal radius and  ulna.  The patient also is noted to have midway scaphoid fracture.  After the films were  reviewed with the patient, we discussed treatment  options.  It was recommended that she undergo the above procedure.  Risks, benefits, and alternatives were discussed in detail with the  patient and signed informed consent was obtained.  Risks include but not  limited to bleeding, infection, damage to nearby nerves, arteries, or  tendons, nonunion, malunion, hardware failure, loss of motion of the  wrist and digits, need for further surgical intervention, damage to  nearby nerves, arteries, or tendons.  We talked about the risk of bone  grafting and the potential need for bone grafting as well as further  surgical endeavors.   DESCRIPTION OF PROCEDURE:  The patient was properly identified in the  preoperative holding area, mark with permanent marker was made on the  left upper extremity to indicate correct operative site. The patient was  then brought back to the operating room and placed supine on the  anesthesia room table and general anesthesia was administered.  The  patient tolerated this well.  Careful attention was taken because of the  proximal humerus fracture that she had on that side on the left upper  extremity.  A well-padded tourniquet was then placed on the left  brachium and sealed with 1000 drape.  The left upper extremity was then  prepped with Hibiclens and then sterilely draped.  The patient then  received preoperative antibiotics.  The left upper extremity was then  prepped with Hibiclens and sterilely draped.  Time-out was called.  Correct site was then identified.  The procedure was then begun.  Using  the mini C-arm, images were obtained showing that the radius could be  reduced; however, it was grossly unstable.  It was felt that it could be  plated volarly.  All standard FCR incision was then made.  It was  extended radially across the wrist in order to expose the midway  scaphoid fracture through the same incision.  Dissection was carried  down through the  skin and subcutaneous tissues.  Hemostasis was obtained  with bipolar cautery.  The tourniquet had been insufflated to 250 mmHg.  The FCR sheath was then opened along its entire length all the way up to  the ridge of the trapezium and in order to expose the scaphoid.  Attention was turned to the radius going through the floor of the FCR  sheath.  The FPL was identified, retracted ulnarly.  The pronator  quadratus that was left, it was then elevated off the radius bed.  Brachioradialis was released and a tenotomy was created to the  brachioradialis carefully making sure to identify the first dorsal  compartment tendons.  Following this allowed for adequate exposure of  the fracture site.  The fracture site was then opened and because of the  voids within the metaphyseal region, bone graft Ortho-Glass 5 mL was  then packed into the defect, approximately 2-3 mL were packed into the  defect area.  After placement of the allograft bone graft, the Synthes  volar plate was then applied.  It was then fixed in the oblong hole with  bicortical screw.  The plate position was then confirmed after  appropriate alignment.  The plate was shifted slightly ulnarly in order  to fix the ulnar column.  The patient did have an intra-articular  fracture former fragments in the mainly involving the ulnar column,  therefore, the plate was adjusted in order to give adequate support and  fixation to the ulnar region.  Three locking pegs were then placed with  the ulnar column with the appropriate drill depth gauge measurement.  Attention was then turned to the radial column where three more screws  were then placed, one locking peg and two more 2.4-mm bicortical screws.  These screws were then all confirmed to make sure they did not penetrate  the articular surface.  It was felt to be good position of the distal  fixation.  Attention was then turned proximally where two more locking  screws were then placed.  After  placement of all the implants, the  wounds were then thoroughly irrigated.  Final images were then obtained  with a mini C-arm.  After fixation of the radius, the patient did have a  comminuted ulnar shaft, distal ulnar shaft fracture extending in the  metaphysis into the ulnar head and it was felt given the degree of  comminution and the overall alignment which was acceptable, did not fix  the ulna.  Attention was then turned to the scaphoid where the scaphoid  was then exposed.  The joint capsule  was then incised going through the  floor of the FCR.  The scaphoid was then exposed.  Open reduction was  then performed of the scaphoid waist fracture.  Attention was then  turned after open reduction with small pickups and a Therapist, nutritional, the  guidewire for the standard Acutrak screw was then placed.  Its position  was then confirmed using the mini C-arm going down the axis of the  scaphoid.  Following this, the appropriate drill was then used to over  drill the cortex.  The appropriate depth gauge was then used with a  standard set.  A 24-mm Acutrak screw was then placed with good fixation  and good compression across the fracture site.  Following this, the  final mini C-arm images and stress radiography was done to confirm  adequate placement of the screws and internal fixation of the radius.  All wounds were then thoroughly irrigated.  The exposure of the scaphoid  was then closed with 2-0 Vicryl to the capsule and floor of the FCR  sheath.  The pronator quadratus over the distal radius was then closed  with what was left.  The area was closed with 2-0 Vicryl suture.  Tourniquet was then deflated.  Hemostasis was obtained with bipolar  cautery and direct pressure and saline irrigation.  Subcutaneous tissues  were then closed with 4-0 Vicryl.  Prior to the subcutaneous closure  layer,  the #70 TLS drain was then applied.  The skin was then closed  for running horizontal mattress 3-0 nylon  suture.  A 20 mL of 0.25%  Marcaine were infiltrated through the drain into the incision area.  Xeroform dressing was then applied.  Sterile compressive bandage was  then applied.  The patient was then placed in a well-padded sugar-tong  splint.  Final images were obtained and printed.   The patient was then extubated and taken to recovery room in good  condition.   POSTOPERATIVE PLAN:  The patient is going to be being admitted overnight  for IV antibiotics and pain control.  Radiographs of the shoulder will  be obtained to make sure the fracture did not displace the proximal  humerus.  Plan to continue nonweightbearing in the left upper extremity.  She will be discharged when her pain is controlled.  She may need  further intervention for further displacement of the shoulder.  We will  discuss further options with her.  We would also like to try to get her  INR back to a closer level if she requires shoulder surgery given we  will not be able to use the tourniquet.      Madelynn Done, MD  Electronically Signed     FWO/MEDQ  D:  06/15/2008  T:  06/16/2008  Job:  [id number]

## 2010-10-09 NOTE — H&P (Signed)
[name], [name]               ACCOUNT NO.:  [id number]   MEDICAL RECORD NO.:  [id number]          PATIENT TYPE:  INP   LOCATION:  4710                         FACILITY:  [facility]   PHYSICIAN:  Sheryl Mends, MD      DATE OF BIRTH:  Aug 19, 1923   DATE OF ADMISSION:  12/31/2008  DATE OF DISCHARGE:                              HISTORY & PHYSICAL   HISTORY:  [name] is a patient of Dr. Julieanne Evans and Dr.  Susann Evans.  She has a prior medical history of proximal atrial  fibrillation, no known coronary disease.  She also has hypertension,  hypothyroidism and dyslipidemia.  She is on Coumadin for her atrial fib.  Apparently the evening of December 30, 2008, she was watching TV about 10  p.m.  She felt her heart started beating hard.  It felt like a drum.  She took an extra amiodarone and went and laid down in bed.  However by  11:30, it had not helped and she told her husband she needed to go to  seek medical care.  She went to the [facility] in [place].  She was seen there.  I was called about 2 p.m. for transfer for  admission.  She apparently was in and out of atrial fib and also a left  bundle branch block at their facility.  It also appears that a chest x-  ray showed some mild interstitial edema.  I do not see that they gave  her any Lasix.  She does say that since she was there that she did have  very frequent and large amounts of urination.  She was just seen by Dr.  Clarene Evans on December 26, 2008 at which time he increased her benazepril to 20  mg b.i.d. for elevated blood pressure.  She recently had problems with  elevated blood pressure, swelling and some shortness of breath.  Her  medications have been adjusted since July.  She was taken off Caduet  thinking that the Norvasc was causing her swelling and her benazepril  has been increased since.   PRIOR MEDICAL HISTORY:  1. Includes PAF and most recently her most recent episode of AFib was      apparently in  March at which time her amiodarone was increased to      400 mg a day and she converted on her own into sinus rhythm.  She      has been seen frequently by Dr. Clarene Evans recently because of multiple      problems including shortness of breath, her PAF and her      hypertension.  2. Hypertension.  3. Hypothyroidism.  4. Hyperlipidemia.  5. History of ruptured eardrum.  6. A fall in January with partial shoulder repair.  7. A 2-D echocardiogram done June 05, 2008, she had normal LV      function, but  moderate MR and mild-moderate aortic valvular      disease.  The echo showed some AI.   MEDICATIONS:  1. Lopressor 50 mg half a tab b.i.d.  2. Benazepril 10 mg  b.i.d.  3. Amiodarone 200 mg a day.  4. Lasix 40 mg a day.  5. Ibuprofen 800 mg b.i.d.  6. Synthroid 88 mcg a day.  7. Zetia 10 mg a day.  8. Coumadin 2.5 mg a day except 1.25 mg on Tuesdays and Fridays.  9. Lipitor 40 mg a day.   ALLERGIES:  SULFA.   FAMILY HISTORY:  Father died at age 75 of an MI.  Mother died at age 32.  One sister had a pacemaker.  Two brothers both deceased of MI in 9s.   REVIEW OF SYSTEMS:  She denies any presyncope, no syncope, no chest  pain.  No recent shortness of breath, however, she did have shortness of  breath complaints back in April.  No swelling.  This has resolved since  changing her medications.  No recent cold, cough, fever, infection.  No  GERD.  No abdominal pain.  No dysuria.  No black or tarry stools.  No  diarrhea.  Other systems are negative.  She has recently been in good  health except for her blood pressure problems.   PHYSICAL EXAMINATION:  VITAL SIGNS:  Blood pressure 142/65, heart rate  52 and first-degree block, temperature 97.9, O2 sat 95% on room air,  respirations 18.  GENERAL:  No acute distress.  She is a white haired elderly female who  looks younger than her age.  SKIN:  Warm and dry.  HEENT:  Pupils are equal and round.  She has no thyromegaly, no  adenopathy.   Positive carotid bruits bilateral questionable, sounds like  transmitted murmur.  Respirations are clear to auscultation bilaterally  with good inspiratory effort.  CARDIAC:  Heart sounds are regular with a 2/6 systolic ejection murmur.  ABDOMEN:  Soft, nontender.  Bowel sounds are present x4.  LOWER EXTREMITIES:  Without any edema.  She has good dorsalis pedis  pulses.  MUSCULOSKELETAL:  Moves all extremities x4.  NEURO:  She is alert and oriented x3.  No focal deficits detected.  Facial symmetry is equal.   ASSESSMENT:  1. Paroxysmal atrial fibrillation.  2. Left bundle branch block, intermittent.  3. Mild interstitial edema by chest x-ray.  4. Hypertension.  Apparently this is labile at home.  She states that      at times it is systolic in the 190s and at other times it is in the      120s mostly in the afternoons.  It seems to be lower than in the      morning.  5. Dyslipidemia.  6. Anticoagulation with Coumadin.  7. Hypothyroidism.  8. History of valvular heart disease with moderate mitral      regurgitation and aortic valve disease with aortic insufficiency.   LABORATORY DATA:  Hemoglobin 12.8, hematocrit 37.5, WBC 6.2, platelets  212, sodium 147, potassium 3.5, chloride 109, CO2 26, glucose 107, BUN  20, creatinine 1.0, magnesium was 2.3.  INR was 2.8.   DIAGNOSTICS:  Chest x-ray showed interstitial edema.  Point of care  markers were negative x1.   PLAN:  Currently she is in sinus brady with first-degree heart block.  Apparently bradycardia has been a problem for her in the past and unable  to increase her beta blocker.  We will keep her on her usual  medications.  Monitor her  rhythm.  Follow her CK-MB and troponins. She states that she has not had  a recent stress test.  She will need a cardiac ischemic evaluation with  her problems  with arrhythmia and interstitial edema.  However, this may  be due to her valvular heart disease.      [name], N.P.       Sheryl Mends, MD  Electronically Signed    BB/MEDQ  D:  12/31/2008  T:  12/31/2008  Job:  [id number]

## 2010-10-09 NOTE — Consult Note (Signed)
NAMEKIMIA, FINAN               ACCOUNT NO.:  [id number]   MEDICAL RECORD NO.:  [id number]          PATIENT TYPE:  INP   LOCATION:  5036                         FACILITY:  [facility]   PHYSICIAN:  Madelynn Done, MD  DATE OF BIRTH:  05-Jul-1923   DATE OF CONSULTATION:  06/17/2008  DATE OF DISCHARGE:  06/17/2008                                 CONSULTATION   ADMISSION DIAGNOSES:  1. Fall onto left upper extremity.  2. Left distal radius fracture and ulnar fracture.  3. Left scaphoid fracture.  4. Left proximal humerus fracture.  5. Anticoagulation therapy.   CONSULTING PHYSICIAN:  Thereasa Solo. Little, MD, Cardiology.   PROCEDURES:  1. A 2-D cardiogram requested by Cardiology.  2. Open reduction and internal fixation of left radius and scaphoid on      June 15, 2008.   DISCHARGE MEDICATIONS:  Resume home medications and doses.  1. Hold Coumadin until shoulder surgery.  2. Darvocet-N 100 1-2 tablets every 4-6 hours as needed for pain.  3. Colace 100 mg p.o. b.i.d.  4. Multivitamin 1 tablet p.o. daily.   REASON FOR ADMISSION:  Ms. Cue is an 77 year old female who fell  off the ladder and sustained a displaced left proximal humerus fracture  as well as left distal radius and ulna fractures as well as a left  scaphoid fracture.  The patient was admitted for pain control and did  undergo surgical intervention.   HOSPITAL COURSE:  The patient was admitted to the orthopedic floor.  The  patient received cardiac clearance by her cardiologist.  The patient's  INR, when came in was greater than 2.  When she came in, she was given a  fresh-frozen plasma.  Her INR did improve to 1.7.  She was taken to the  operating room on June 15, 2008.  She tolerated this procedure well  under general anesthesia.   Postoperatively, she continued on the IV pain medication and was  converted on postoperative day #1, to oral medication.  The patient was  seen and examined on postoperative day  #2, and felt ready to be  discharged to home.  On the day of discharge, the patient was afebrile.  Vital signs were stable and normal.  She was tolerating regular diet.  Pain was controlled on oral pain medications and felt ready to be  discharged to home.   RECOMMENDATIONS AND DISPOSITION:  The patient will be discharged to home  with the sugar-tong splint and the sling, no use of the left upper  extremity, and the above discharge medications.  She was instructed by  the cardiologist to hold off on the Coumadin until her shoulder surgery.  The patient does have upcoming shoulder surgery and will likely need a  hemiarthroplasty given the displaced and comminuted proximal humerus  fracture.  The patient will be seen back in the office on Tuesday we  will plan surgery on the shoulder.  The purpose of delaying the surgery  is to allow for her to be off the Coumadin for improvement in her  coagulation studies and INR, given the potential  for blood loss in the  shoulder region.  The patient was felt comfortable with this decision.  Prior to discharge, the patient's discharge instructions were explained  and her questions were answered and the patient felt ready to be  discharged.   CONDITION AT DISCHARGE:  Good.      Madelynn Done, MD  Electronically Signed     FWO/MEDQ  D:  06/17/2008  T:  06/18/2008  Job:  [id number]

## 2010-10-09 NOTE — Op Note (Signed)
[name], [name]               ACCOUNT NO.:  [id number]   MEDICAL RECORD NO.:  [id number]          PATIENT TYPE:  INP   LOCATION:  5038                         FACILITY:  [facility]   PHYSICIAN:  Madelynn Done, MD  DATE OF BIRTH:  August 29, 1923   DATE OF PROCEDURE:  06/24/2008  DATE OF DISCHARGE:                               OPERATIVE REPORT   PREOPERATIVE DIAGNOSES:  1. Left four-part proximal humerus fracture, displaced.  2. Osteoporosis.  3. Fall onto left shoulder.   POSTOPERATIVE DIAGNOSES:  1. Left four-part proximal humerus fracture, displaced.  2. Osteoporosis.  3. Fall onto left shoulder.   ATTENDING SURGEON:  Madelynn Done, MD, who was scrubbed and present  for the entire procedure.   ASSISTANT SURGEON:  Almedia Balls. Norris, MD who was scrubbed and present  for the entire procedure and assisted in the key portions of the  procedure in terms of exposure as well as implant placement.   ANESTHESIA:  General via endotracheal tube.   Regional anesthesia interscalene block performed by Anesthesia.   ESTIMATED BLOOD LOSS:  250 mm.   SURGICAL IMPLANTS:  DePuy global hemiarthroplasty with a 12-mm stem,  cemented with 8044 head, 44 x 18 mm head.   SURGICAL INDICATIONS:  Ms. Waldrop is an 77 year old female who  sustained a fall on her left arm and sustained a displaced proximal  humerus fracture as well as left radius and ulna and scaphoid fracture.  She underwent last week ORIF of her wrist and was scheduled to return  back to the operating room for the above procedure.  Risks, benefits,  and alternatives are discussed in detail with the patient and signed  informed consent was obtained.  The risks include but not limited to  bleeding, infection, damage to nearby nerves, arteries, and tendons,  nonunion, malunion, hardware failure, loss of fixation, instability,  loss of motion of shoulder, and need for further surgical intervention.  A signed informed consent was  obtained.   SURGICAL PROCEDURES:  1. Left shoulder hemiarthroplasty.  2. Left shoulder biceps tenodesis.   DESCRIPTION OF PROCEDURE:  The patient was brought appropriately  identified in the preop holding area and a mark with a permanent marker  was made on the left shoulder to indicate the correct operative site.  The patient was then brought back to the operating room and placed  supine on the anesthesia room table.  General anesthesia was  administered via endotracheal tube.  The patient received preoperative  antibiotics prior to any skin incision.  The left upper extremity was  then prepped with DuraPrep after it was sealed off with U-drapes.  A  time-out was called.  The correct side was identified and the procedure  was then begun.  The patient was then placed in a modified beach-chair  position.  The skin incision was then marked out from extended  deltopectoral approach.  The incision was then begun over the clavicle  directly superior to the coracoid process sweeping laterally and  distally entering the insertion of the deltoid on the humerus.  This  allowed for further  mobilization of the deltoid if needed.  Dissection  was then carried out, the cephalic vein was identified, and the vein was  then retracted laterally.  In the interval, then further dissection  carried out.  The hemostasis was obtained with the electrocautery.  The  medial branches of the cephalic vein were then cauterized.  The  clavipectoral fascia was then identified and then incised  longitudinally.  The conjoined tendon was then retracted medially.  Following this, identification of the lesser and greater tuberosity  fragments were then carried out.  Using a small osteotome, osteotomies  were then created of the lesser tuberosity after mobilizing the  subscapularis tendon.  Several #2 FiberWire sutures were then placed  into the Lesser tuberosity and its subscapularis were then mobilized in  order to  free up the subscapularis.  Following placement of these  sutures, the rotator interval between the subscapularis and  supraspinatus was then maintained.  The greater tuberosity was then  osteotomized with an osteotome.  The tuberosities were then clearly  identified and then three #2 FiberWire sutures were then placed within  the greater tuberosity fragment.  Tuberosities were both freed and  adequately prepared.  Following identification of both tuberosities,  attention was then turned to humeral head retrieval with the articular  segment of the humeral head was then removed and appropriately sized.  Following this, attention was then turned to the preparation of the  humeral shaft.  The humeral shaft was then brought into the field.  The  tuberosities were retracted out of the way.  The humeral shaft was then  reamed going from a 6-mm up to 12-mm reamer.  Following this, the  humeral stem was then inserted.  Appropriate retroversion and alignment  was then maintained with the biceps groove.  The biceps was then  released at the level of its insertion in the glenoid.  The inner  tubercle groove was then used for identification and landmark.  The  humeral stem was then placed and appropriate humeral head was then  placed and a trial was then done.  The appropriate humeral length was  then assessed.  Using the laser guide on the stem, the appropriate  length of the stem was then trialed.  Following this, 3 drill holes were  then placed in the humeral shaft in order to pass #2 FiberWire sutures  through each drill holes.  Following this, a humeral canal was then  prepared adequately with thorough irrigation as well as copious amounts  of lap sponges in order to drive the canal.  Following this, the cement  was then adequately prepared and then hand pressurized and placed on the  humeral shaft.  Following cementing, the humeral component was then  applied making sure to align the shaft and  the stem up with the  appropriate anatomical alignment.  The around-the-world stitch of #2  FiberWire was placed in the humeral prosthesis prior to application of  the prosthesis.  Following this, the stem was cemented in place and  allowed to dry and had good stability.  The humeral head was then placed  and then the joint was then reduced.  Following this addition, the bone  graft was then packed around the defected area and the bone graft was  taken from what was left of the humeral head.  Cancellous graft was then  packed around the stem.  Attention was then turned to tuberosity  reattachment.  The greater and lesser tuberosities were then sewn  down  beginning with the more proximal sutures and then beginning with the  inferior sutures with good realignment of the humeral tuberosities.  Following this, the biceps tendon was then tenodesed using the sutures  through the drill holes on the humeral shaft and going through the  biceps tendon, the biceps tendon was then anchored and tenodesed to the  overall repair within the inner tubercle groove.  Finally, the around-  the-world stitch that was placed through the stem and then through the  lesser and the greater tuberosities were then tied down for supplemental  fixation.  This allowed for a good solid repair of both tuberosities  being positions few millimeters below the top of the humeral head  component.  The shoulder was then placed through a range of motion  checking for stability of the tuberosities as well as some motion of the  implant.  Following this, the wounds were then thoroughly irrigated.  The deep subcutaneous tissues and the clavicle pectoral fascia interval  was then closed with 0 Vicryl suture.  Subcutaneous tissues were then  closed with 2-0 Vicryl and the skin was closed with skin staples.  A 20  mL of 0.25% Marcaine was infiltrated locally around the incision site.  Adaptic dressing and sterile compressive bandage  was then applied.  The  patient tolerated the procedure well without any intraoperative  complications.   POSTOPERATIVE PLAN:  The patient will be admitted overnight for pain  control and IV antibiotics.  She will be then begin the initial phase of  rehab aimed at working on gentle motion of the shoulder, Codman's  exercises and then begin gradual range of motion over the next couple of  weeks.  Staples out at the 2-week mark and then radiographs at the 2-  week mark and then at every 2-week interval.      Madelynn Done, MD  Electronically Signed     FWO/MEDQ  D:  06/24/2008  T:  06/25/2008  Job:  [id number]

## 2010-10-12 NOTE — Op Note (Signed)
   NAMEVENNESSA, [name]                         ACCOUNT NO.:  [id number]   MEDICAL RECORD NO.:  [id number]                   PATIENT TYPE:  AMB   LOCATION:  ENDO                                 FACILITY:  [facility]   PHYSICIAN:  Charna Elizabeth, M.D.                   DATE OF BIRTH:  [birth year]   DATE OF PROCEDURE:  02/09/2002  DATE OF DISCHARGE:                                 OPERATIVE REPORT   PROCEDURE:  Esophagogastroduodenoscopy.   ENDOSCOPIST:  Charna Elizabeth, M.D.   INSTRUMENT USED:  Olympus video panendoscope.   INDICATION FOR PROCEDURE:  Guaiac-positive stools in a 77 year old white  female.  Rule out peptic ulcer disease, esophagitis, gastritis, etc.   PREPROCEDURE PREPARATION:  Informed consent was procured from the patient.  The patient was fasted for eight hours prior to the procedure.  The  patient's Coumadin was held for five days prior to the procedure as per Dr.  Quita Skye recommendation.  The patient is allergic to SULFA, and she was  therefore given Cipro 400 mg IV for MVP prophylaxis.   PREPROCEDURE PHYSICAL:  VITAL SIGNS:  The patient had stable vital signs.  NECK:  Supple.  CHEST:  Clear to auscultation.  S1, S2 regular.  ABDOMEN:  Soft with normal bowel sounds.   DESCRIPTION OF PROCEDURE:  The patient was placed in the left lateral  decubitus position and sedated with 50 mg of Demerol and 5 mg of Versed  intravenously.  Once the patient was adequately sedate and maintained on low-  flow oxygen and continuous cardiac monitoring, the Olympus video  panendoscope was advanced through the mouthpiece, over the tongue, into the  esophagus under direct vision.  The entire esophagus appeared normal with no  evidence of ring, stricture, masses, esophagitis, or Barrett's mucosa.  The  stomach was then advanced in the stomach.  The entire gastric mucosa and the  proximal small bowel appeared normal.   IMPRESSION:  Normal EGD.    RECOMMENDATIONS:  Proceed with a  colonoscopy at this time to further  evaluate the patient's guaiac-positive stool.   PREPROCEDURE PHYSICAL:                                               Charna Elizabeth, M.D.    JM/MEDQ  D:  02/09/2002  T:  02/10/2002  Job:  [id number]   cc:   Sharlot Gowda, MD  [phone] [address] [place], [place] [address]  Fax: [phone]

## 2010-10-12 NOTE — Discharge Summary (Signed)
[facility]  Patient:    [name]                       MRN: [id number] Adm. Date:  [id number] Disc. Date: [id number] Attending:  Loreli Dollar Dictator:   Weyman Pedro, P.A. CC:         Ronnald Nian, M.D.   Discharge Summary  ADMITTING PHYSICIAN:  [name], M.D.  DISCHARGE PHYSICIAN:  Thereasa Solo. Little, M.D.  DATE OF BIRTH:  Apr 13, 1924  ADMITTING DIAGNOSES:  Rapid atrial flutter 2:1.  DISCHARGE DIAGNOSES: 1. Rapid atrial flutter converted to sinus rhythm. 2. History of paroxysmal atrial fibrillation. 3. Hypothyroidism. 4. Hyperlipidemia. 5. Hypertension. 6. Status post ruptured ear drum with strep throat.  PROCEDURES:  None.  CONSULTATIONS:  None.  DISPOSITION: Home.  DISCHARGE CONDITION:  Much improved.  Telemetry showed sinus rhythm/sinus bradycardia.  No complaints of chest pain, shortness of breath, or arrhythmias.  Patient ambulating in the hallways without difficulty.  DISCHARGE MEDICATIONS: 1. Cardizem 120 mg 1 p.o. b.i.d. 2. Lopressor 50 mg 1/2 tablet p.o. bid 3. Coumadin 5 mg 1 p.o. q. day. 4. Synthroid 0.1 mg 1 o q. day. 5. Lipitor 10 mg 1 p.o. q. day. 6. Multivitamin 1 p.o. q. day. 7. Lovenox 60 mg subcuticular injection 2 times a day and once on Thursday.  DISCHARGE INSTRUCTIONS:  ACTIVITY:  As tolerated.  DIET:  Maintain low fat, low salt, low cholesterol diet.  No caffeine.  FOLLOWUP: 1. Have PT/INR drawn on Monday December 31, 1999. 2. See Weyman Pedro, Physician Assistant, next Tuesday.  Call office for    appointment.  HISTORY OF PRESENT ILLNESS:  The patient is a 77 year old married white female with a history of PAF.  Approximately 2 years ago when she was awakened from sleep.  Episode spontaneously converted to normal sinus rhythm.  Apparently had a 2-D echocardiogram which was normal.  No significant CRF except elevated hyperlipidemia.  Denies chest pain.  Patient awoke about 4 a.m.  this morning with palpitations and went to [facility] this afternoon and found to be SVT. The patient told to come to [facility].  Currently without symptoms and "doesnt feel good".  Also complains of diaphoresis.  No chest pain or shortness of breath.  Exam completely benign.  EKG shows PSVT at approximately 130 beats per second versus atrial flutter with a 2:1.  Attempted pharmacological conversion using Adenocard 6 mg. Went into AV block with clear flutter waves.  Will heparinize and start IV Cardizem.  If the patient does not break spontaneously will schedule a TEE cardioversion for tomorrow morning.  ALLERGIES:  Sulfa.  LABORATORY DATA:  Hematology on discharge:  White blood count 6, hemoglobin 11, hematocrit 31.2, platelets 271.  Coagulation studies on discharge:  PT 14.5, INR 1.3, PTT 69.  Routine chemistry on discharge:  Sodium 135, potassium 2.5, chloride 105, CO2 26, glucose 99, and BUN 21, creatinine 0.7, calcium 8.3.  Cardiac markers ranged from total CK of 80 to 53.  CK-MB 1.9 to 2. Relative index 2.4 to 3.8.  Troponin I 0.03 to 0.05.  TSH 2.884.  Initial EKG on December 23, 1999, at 16:48 hours showed sinus tachycardia versus atrial flutter with marked ST abnormalities, possible inferior subendocardial injury with no old tracing to compare.  Subsequent EKG taken at 17:41 hours showed nonspecific ST abnormalities probably digitalis effect with atrial fibrillation developing since prior EKG.  HOSPITAL  COURSE:  The patient is a 77 year old white female admitted on December 23, 1999, with rapid atrial flutter 2:1.  The patient admitted to telemetry floor and started on IV Cardizem, heparin and beta blockers.  Within the first day the patient received a total dose of 600 mg p.o. Cardizem (300 mg p.o. at home) and then 300 mg 5 p.m.  plus IV Cardizem bolus and Toprol.  The next morning the patients blood pressure stable 120/67-70 with a heart rate in the 50 and  60s.  Telemetry at that time showed sinus bradycardia with heart rate in the low 50s.  CK-MB and Troponins were all negative with TSH negative x 2. INR was subtherapeutic at 1.9, with BUN and creatinine within normal limits.  The last echocardiogram done on previous admission shows the left atrium size 3.1, with mild MR and TR with an EF of 70%.  The patient had no complaints of chest pain, shortness of breath, or arrhythmias and physical examination was unremarkable.  The patient now in normal sinus rhythm and feels okay.  Will split Cardizem dose to 120 mg b.i.d. and add Lopressor 25 mg p.o. b.i.d. and discontinue Toprol.  Will also start Coumadin and continue Lovenox as an outpatient for the next 36 hours after discharge.  Will discharge in the morning if heart rate stable.  Case management saw and informed patient that Lovenox would be covered under her current prescription plan.  The patient was seen and evaluated on December 25, 1999, with blood pressure stable at 114/53, heart rate 50-55 and oxygen saturation 97% on room air.  The patient feeling okay and traveling well in the hallways without assistance. No chest pain or shortness of breath with an INR of 1.3.  The patient still in normal sinus rhythm, sinus bradycardia with no more A fibrillation.  The patient is ready for discharge and will be discharged on the medications prescribed as above.  The patient will no longer be taking aspirin but will now be taking Coumadin 5 mg a day.  The patient was instructed if recurrent A fibrillation flutter would taken an additional 1/2 of Lopressor. The patient will followup in the office on Tuesday with physician assistant.  The patient was sent home on Lovenox x 4 doses.  Prior to discharge the patient was given both 10 mg of Coumadin and 50 mg of subcuticular lovenox. DD:  01/11/00 TD:  01/11/00 Job: [id number]

## 2010-10-12 NOTE — Op Note (Signed)
   NAMEJAZALYN, [name]                         ACCOUNT NO.:  [id number]   MEDICAL RECORD NO.:  [id number]                   PATIENT TYPE:  AMB   LOCATION:  ENDO                                 FACILITY:  [facility]   PHYSICIAN:  Charna Elizabeth, M.D.                   DATE OF BIRTH:  06-02-1923   DATE OF PROCEDURE:  02/09/2002  DATE OF DISCHARGE:                                 OPERATIVE REPORT   PROCEDURE PERFORMED:  Colonoscopy.   ENDOSCOPIST:  Charna Elizabeth, M.D.   INSTRUMENT USED:  Olympus video colonoscope.   INDICATIONS FOR PROCEDURE:  Guaiac positive stool with an unrevealing  esophagogastroduodenoscopy.  Rule out colonic polyps, masses, hemorrhoids,  etc.   PREPROCEDURE PREPARATION:  Informed consent was procured from the patient.  The patient was fasted for eight hours prior to the procedure and prepped  with a bottle of magnesium citrate and a gallon of NuLytely the night prior  to the procedure.   PREPROCEDURE PHYSICAL:  The patient had stable vital signs. Neck supple.  Chest clear to auscultation.  S1 and S2 regular.  Abdomen soft with normal  bowel sounds.   DESCRIPTION OF PROCEDURE:  The patient was placed in left lateral decubitus  position and sedated with Demerol and Versed for the EGD.  No additional  sedation was used for the colonoscopy.  Once the patient was adequately  sedated and maintained on low flow oxygen and continuous cardiac monitoring,  the Olympus video colonoscope was advanced from the rectum to the cecum with  difficulty.  The patient had a very tortuous colon.  The patient's position  was changed from the left lateral to the supine and right lateral  position  to reach the cecum.  The appendiceal orifice and ileocecal valve were  clearly visualized and photographed.  The patient had prominent internal  hemorrhoids.  No masses, polyps, erosions, no diverticula were seen.   IMPRESSION:  1. Very tortuous colon.  2. Prominent internal hemorrhoid.  3.  No masses or polyps seen.    RECOMMENDATIONS:  1. Repeat guaiac testing will be done on an outpatient basis and further     recommendations made as needed.  2. A high fiber diet has been recommended along with liberal fluid intake.                                                   Charna Elizabeth, M.D.    JM/MEDQ  D:  02/09/2002  T:  02/10/2002  Job:  [id number]   cc:   Sharlot Gowda, MD  [phone] [address] [place], [place] [address]  Fax: [phone]

## 2011-03-27 ENCOUNTER — Encounter: Payer: Self-pay | Admitting: Family Medicine

## 2011-06-12 ENCOUNTER — Other Ambulatory Visit: Payer: Self-pay | Admitting: Cardiovascular Disease

## 2011-06-13 ENCOUNTER — Other Ambulatory Visit: Payer: Self-pay | Admitting: Cardiology

## 2011-06-13 ENCOUNTER — Ambulatory Visit
Admission: RE | Admit: 2011-06-13 | Discharge: 2011-06-13 | Disposition: A | Discharge status: Home / Self Care | Payer: Medicare Other | Source: Ambulatory Visit | Attending: Cardiology | Admitting: Cardiology

## 2011-06-13 DIAGNOSIS — R0602 Shortness of breath: Secondary | ICD-10-CM

## 2011-06-14 ENCOUNTER — Encounter (HOSPITAL_COMMUNITY): Payer: Self-pay | Admitting: Pharmacy Technician

## 2011-06-19 ENCOUNTER — Encounter (HOSPITAL_COMMUNITY)
Admission: RE | Disposition: A | Discharge status: Home / Self Care | Payer: Self-pay | Source: Ambulatory Visit | Attending: Cardiovascular Disease

## 2011-06-19 ENCOUNTER — Ambulatory Visit (HOSPITAL_COMMUNITY): Payer: Medicare Other | Admitting: Anesthesiology

## 2011-06-19 ENCOUNTER — Encounter (HOSPITAL_COMMUNITY): Payer: Self-pay | Admitting: Anesthesiology

## 2011-06-19 ENCOUNTER — Ambulatory Visit (HOSPITAL_COMMUNITY)
Admission: RE | Admit: 2011-06-19 | Discharge: 2011-06-19 | Disposition: A | Discharge status: Home / Self Care | Payer: Medicare Other | Source: Ambulatory Visit | Attending: Cardiovascular Disease | Admitting: Cardiovascular Disease

## 2011-06-19 ENCOUNTER — Other Ambulatory Visit: Payer: Self-pay

## 2011-06-19 ENCOUNTER — Other Ambulatory Visit: Payer: Self-pay | Admitting: Cardiovascular Disease

## 2011-06-19 DIAGNOSIS — I4891 Unspecified atrial fibrillation: Secondary | ICD-10-CM | POA: Insufficient documentation

## 2011-06-19 HISTORY — PX: CARDIOVERSION: SHX1299

## 2011-06-19 LAB — PROTIME-INR: Prothrombin Time: 33.4 seconds — ABNORMAL HIGH (ref 11.6–15.2)

## 2011-06-19 SURGERY — CARDIOVERSION
Anesthesia: General | Wound class: Clean

## 2011-06-19 MED ORDER — SODIUM CHLORIDE 0.9 % IJ SOLN: 3.0000 mL | INTRAMUSCULAR | Status: DC | PRN

## 2011-06-19 MED ORDER — SODIUM CHLORIDE 0.45 % IV SOLN: INTRAVENOUS | Status: DC

## 2011-06-19 MED ORDER — PROPOFOL 10 MG/ML IV BOLUS
INTRAVENOUS | Status: DC | PRN
  Administered 2011-06-19: 80 mg via INTRAVENOUS

## 2011-06-19 MED ORDER — SODIUM CHLORIDE 0.9 % IV SOLN
INTRAVENOUS | Status: DC | PRN
  Administered 2011-06-19: 14:00:00 via INTRAVENOUS

## 2011-06-19 MED ORDER — HYDROCORTISONE 1 % EX CREA: 1.0000 "application " | TOPICAL_CREAM | Freq: Three times a day (TID) | CUTANEOUS | Status: DC | PRN

## 2011-06-19 NOTE — H&P (Signed)
History reviewed, patient examined, no change in status, stable for surgery. Sheryl Fair, MD, [facility] [phone] 06/19/2011 12:07 PM

## 2011-06-19 NOTE — Anesthesia Preprocedure Evaluation (Addendum)
Anesthesia Evaluation  Patient identified by MRN, date of birth, ID band Patient awake    Reviewed: Allergy & Precautions, H&P , NPO status , Patient's Chart, lab work & pertinent test results, reviewed documented beta blocker date and time   Airway Mallampati: II TM Distance: >3 FB Neck ROM: Full    Dental  (+) Teeth Intact, Implants and Dental Advisory Given   Pulmonary  clear to auscultation        Cardiovascular hypertension, Pt. on medications and Pt. on home beta blockers +CHF + dysrhythmias Atrial Fibrillation Irregular Normal+ Systolic murmurs EF 45%   Neuro/Psych    GI/Hepatic   Endo/Other  Hypothyroidism   Renal/GU      Musculoskeletal   Abdominal   Peds  Hematology   Anesthesia Other Findings   Reproductive/Obstetrics                        Anesthesia Physical Anesthesia Plan  ASA: III  Anesthesia Plan: General   Post-op Pain Management:    Induction: Intravenous  Airway Management Planned: Mask  Additional Equipment:   Intra-op Plan:   Post-operative Plan:   Informed Consent: I have reviewed the patients History and Physical, chart, labs and discussed the procedure including the risks, benefits and alternatives for the proposed anesthesia with the patient or authorized representative who has indicated his/her understanding and acceptance.   Dental advisory given  Plan Discussed with: CRNA, Anesthesiologist and Surgeon  Anesthesia Plan Comments:         Anesthesia Quick Evaluation

## 2011-06-19 NOTE — Anesthesia Postprocedure Evaluation (Signed)
  Anesthesia Post-op Note  Patient: [name]  Procedure(s) Performed:  CARDIOVERSION  Patient Location: PACU and Short Stay  Anesthesia Type: General  Level of Consciousness: awake and alert   Airway and Oxygen Therapy: Patient Spontanous Breathing and Patient connected to nasal cannula oxygen  Post-op Pain: none  Post-op Assessment: Post-op Vital signs reviewed, Patient's Cardiovascular Status Stable, Respiratory Function Stable, Patent Airway, No signs of Nausea or vomiting and Pain level controlled  Post-op Vital Signs: Reviewed and stable  Complications: No apparent anesthesia complications

## 2011-06-19 NOTE — Op Note (Signed)
Procedure: Electrical Cardioversion Indications:  Atrial Fibrillation  Procedure Details:  Consent: Risks of procedure as well as the alternatives and risks of each were explained to the (patient/caregiver).  Consent for procedure obtained.  Time Out: Verified patient identification, verified procedure, site/side was marked, verified correct patient position, special equipment/implants available, medications/allergies/relevent history reviewed, required imaging and test results available.  Performed  Patient placed on cardiac monitor, pulse oximetry, supplemental oxygen as necessary.  Sedation given: Propofol 80 mg IV (Dr. Ivin Booty, Anesthesiology) Pacer pads placed anterior and posterior chest.  Cardioverted 1 time(s).  Cardioverted at 150J. Synchronized biphasic.  Evaluation: Findings: Post procedure EKG shows: Sinus bradycardia 45-52 bpm, occasional PACs) Complications: None Patient did tolerate procedure well.  Time Spent Directly with the Patient:  30 minutes   [name] 06/19/2011, 1:59 PM

## 2011-06-19 NOTE — Preoperative (Signed)
Beta Blockers   Reason not to administer Beta Blockers:Not Applicable 

## 2011-06-19 NOTE — Transfer of Care (Signed)
Immediate Anesthesia Transfer of Care Note  Patient: [name]  Procedure(s) Performed:  CARDIOVERSION  Patient Location: PACU  Anesthesia Type: General  Level of Consciousness: awake  Airway & Oxygen Therapy: Patient Spontanous Breathing and Patient connected to nasal cannula oxygen  Post-op Assessment: Report given to PACU RN and Post -op Vital signs reviewed and stable  Post vital signs: stable  Complications: No apparent anesthesia complications

## 2011-06-20 ENCOUNTER — Encounter (HOSPITAL_COMMUNITY): Payer: Self-pay | Admitting: Cardiovascular Disease

## 2011-06-21 ENCOUNTER — Inpatient Hospital Stay (HOSPITAL_COMMUNITY)
Admission: EM | Admit: 2011-06-21 | Discharge: 2011-06-25 | DRG: 308 | Disposition: A | Discharge status: Home / Self Care | Payer: Medicare Other | Attending: Internal Medicine | Admitting: Internal Medicine

## 2011-06-21 ENCOUNTER — Emergency Department (HOSPITAL_COMMUNITY): Payer: Medicare Other

## 2011-06-21 ENCOUNTER — Other Ambulatory Visit: Payer: Self-pay

## 2011-06-21 ENCOUNTER — Encounter (HOSPITAL_COMMUNITY): Payer: Self-pay | Admitting: Emergency Medicine

## 2011-06-21 DIAGNOSIS — I359 Nonrheumatic aortic valve disorder, unspecified: Secondary | ICD-10-CM | POA: Diagnosis present

## 2011-06-21 DIAGNOSIS — J9600 Acute respiratory failure, unspecified whether with hypoxia or hypercapnia: Secondary | ICD-10-CM | POA: Diagnosis present

## 2011-06-21 DIAGNOSIS — I509 Heart failure, unspecified: Secondary | ICD-10-CM | POA: Diagnosis present

## 2011-06-21 DIAGNOSIS — I110 Hypertensive heart disease with heart failure: Secondary | ICD-10-CM | POA: Diagnosis present

## 2011-06-21 DIAGNOSIS — I350 Nonrheumatic aortic (valve) stenosis: Secondary | ICD-10-CM | POA: Diagnosis present

## 2011-06-21 DIAGNOSIS — I501 Left ventricular failure: Secondary | ICD-10-CM

## 2011-06-21 DIAGNOSIS — E039 Hypothyroidism, unspecified: Secondary | ICD-10-CM | POA: Diagnosis present

## 2011-06-21 DIAGNOSIS — R000 Tachycardia, unspecified: Secondary | ICD-10-CM | POA: Diagnosis present

## 2011-06-21 DIAGNOSIS — I10 Essential (primary) hypertension: Secondary | ICD-10-CM | POA: Diagnosis present

## 2011-06-21 DIAGNOSIS — E876 Hypokalemia: Secondary | ICD-10-CM | POA: Diagnosis present

## 2011-06-21 DIAGNOSIS — I2510 Atherosclerotic heart disease of native coronary artery without angina pectoris: Secondary | ICD-10-CM | POA: Diagnosis present

## 2011-06-21 DIAGNOSIS — J189 Pneumonia, unspecified organism: Secondary | ICD-10-CM | POA: Diagnosis present

## 2011-06-21 DIAGNOSIS — I428 Other cardiomyopathies: Secondary | ICD-10-CM | POA: Diagnosis present

## 2011-06-21 DIAGNOSIS — E785 Hyperlipidemia, unspecified: Secondary | ICD-10-CM | POA: Diagnosis present

## 2011-06-21 DIAGNOSIS — I5041 Acute combined systolic (congestive) and diastolic (congestive) heart failure: Secondary | ICD-10-CM | POA: Diagnosis present

## 2011-06-21 DIAGNOSIS — D72829 Elevated white blood cell count, unspecified: Secondary | ICD-10-CM | POA: Diagnosis present

## 2011-06-21 DIAGNOSIS — I480 Paroxysmal atrial fibrillation: Secondary | ICD-10-CM

## 2011-06-21 DIAGNOSIS — I5033 Acute on chronic diastolic (congestive) heart failure: Secondary | ICD-10-CM | POA: Diagnosis present

## 2011-06-21 DIAGNOSIS — I4891 Unspecified atrial fibrillation: Principal | ICD-10-CM | POA: Diagnosis present

## 2011-06-21 HISTORY — DX: Nonrheumatic aortic (valve) stenosis: I35.0

## 2011-06-21 HISTORY — DX: Acute respiratory failure, unspecified whether with hypoxia or hypercapnia: J96.00

## 2011-06-21 LAB — CARDIAC PANEL(CRET KIN+CKTOT+MB+TROPI)
CK, MB: 3.4 ng/mL (ref 0.3–4.0)
Relative Index: INVALID (ref 0.0–2.5)
Troponin I: <0.3 ng/mL (ref <0.30)
Troponin I: <0.3 ng/mL (ref <0.30)

## 2011-06-21 LAB — CBC
MCH: 29.9 pg (ref 26.0–34.0)
Platelets: 266 10*3/uL (ref 150–400)
RBC: 4.18 MIL/uL (ref 3.87–5.11)
RDW: 14.2 % (ref 11.5–15.5)
WBC: 14.7 10*3/uL — ABNORMAL HIGH (ref 4.0–10.5)

## 2011-06-21 LAB — POCT I-STAT, CHEM 8
Chloride: 108 mEq/L (ref 96–112)
Potassium: 3.8 mEq/L (ref 3.5–5.1)
Sodium: 142 mEq/L (ref 135–145)
TCO2: 25 mmol/L (ref 0–100)

## 2011-06-21 LAB — PROTIME-INR
INR: 4.11 — ABNORMAL HIGH (ref 0.00–1.49)
INR: 3.93 — ABNORMAL HIGH (ref 0.00–1.49)

## 2011-06-21 LAB — COMPREHENSIVE METABOLIC PANEL
ALT: 29 U/L (ref 0–35)
BUN: 21 mg/dL (ref 6–23)
CO2: 26 mEq/L (ref 19–32)
Calcium: 9 mg/dL (ref 8.4–10.5)
Creatinine, Ser: 1.03 mg/dL (ref 0.50–1.10)
GFR calc Af Amer: 56 mL/min — ABNORMAL LOW (ref >90)
GFR calc non Af Amer: 48 mL/min — ABNORMAL LOW (ref >90)
Glucose, Bld: 125 mg/dL — ABNORMAL HIGH (ref 70–99)
Sodium: 142 mEq/L (ref 135–145)

## 2011-06-21 LAB — INFLUENZA PANEL BY PCR (TYPE A & B)
H1N1 flu by pcr: NOT DETECTED
Influenza B By PCR: NEGATIVE

## 2011-06-21 LAB — PRO B NATRIURETIC PEPTIDE: Pro B Natriuretic peptide (BNP): 1043 pg/mL — ABNORMAL HIGH (ref 0–450)

## 2011-06-21 MED ORDER — EZETIMIBE 10 MG PO TABS
10.0000 mg | ORAL_TABLET | Freq: Every day | ORAL | Status: DC
  Administered 2011-06-21 – 2011-06-25 (×5): 10 mg via ORAL
  Filled 2011-06-21 (×5): qty 1

## 2011-06-21 MED ORDER — BENAZEPRIL HCL 40 MG PO TABS
40.0000 mg | ORAL_TABLET | Freq: Every day | ORAL | Status: DC
  Administered 2011-06-21 – 2011-06-24 (×3): 40 mg via ORAL
  Filled 2011-06-21 (×5): qty 1

## 2011-06-21 MED ORDER — ADULT MULTIVITAMIN W/MINERALS CH
1.0000 | ORAL_TABLET | Freq: Every day | ORAL | Status: DC
  Administered 2011-06-21 – 2011-06-25 (×5): 1 via ORAL
  Filled 2011-06-21 (×5): qty 1

## 2011-06-21 MED ORDER — AZITHROMYCIN 250 MG PO TABS
250.0000 mg | ORAL_TABLET | Freq: Every day | ORAL | Status: AC
  Administered 2011-06-22 – 2011-06-25 (×4): 250 mg via ORAL
  Filled 2011-06-21 (×4): qty 1

## 2011-06-21 MED ORDER — ALPRAZOLAM 0.25 MG PO TABS
0.2500 mg | ORAL_TABLET | Freq: Two times a day (BID) | ORAL | Status: DC | PRN
  Administered 2011-06-22: 0.25 mg via ORAL
  Filled 2011-06-21: qty 1

## 2011-06-21 MED ORDER — HYDRALAZINE HCL 50 MG PO TABS
50.0000 mg | ORAL_TABLET | Freq: Three times a day (TID) | ORAL | Status: DC
  Administered 2011-06-21 – 2011-06-25 (×9): 50 mg via ORAL
  Filled 2011-06-21 (×15): qty 1

## 2011-06-21 MED ORDER — ONDANSETRON HCL 4 MG/2ML IJ SOLN: 4.0000 mg | Freq: Four times a day (QID) | INTRAMUSCULAR | Status: DC | PRN

## 2011-06-21 MED ORDER — FUROSEMIDE 10 MG/ML IJ SOLN
40.0000 mg | Freq: Two times a day (BID) | INTRAMUSCULAR | Status: DC
  Administered 2011-06-21 – 2011-06-22 (×4): 40 mg via INTRAVENOUS
  Filled 2011-06-21 (×7): qty 4

## 2011-06-21 MED ORDER — SODIUM CHLORIDE 0.9 % IJ SOLN: 3.0000 mL | INTRAMUSCULAR | Status: DC | PRN

## 2011-06-21 MED ORDER — DILTIAZEM HCL 100 MG IV SOLR: 5.0000 mg/h | INTRAVENOUS | Status: DC

## 2011-06-21 MED ORDER — AMLODIPINE BESYLATE 2.5 MG PO TABS
2.5000 mg | ORAL_TABLET | Freq: Every day | ORAL | Status: DC
  Administered 2011-06-21: 2.5 mg via ORAL
  Filled 2011-06-21 (×2): qty 1

## 2011-06-21 MED ORDER — FOLIC ACID 1 MG PO TABS
2.0000 mg | ORAL_TABLET | Freq: Every day | ORAL | Status: DC
  Administered 2011-06-21 – 2011-06-25 (×5): 2 mg via ORAL
  Filled 2011-06-21 (×5): qty 2

## 2011-06-21 MED ORDER — FUROSEMIDE 10 MG/ML IJ SOLN
INTRAMUSCULAR | Status: AC
  Administered 2011-06-21: 03:00:00
  Filled 2011-06-21: qty 8

## 2011-06-21 MED ORDER — ATORVASTATIN CALCIUM 40 MG PO TABS
40.0000 mg | ORAL_TABLET | Freq: Every day | ORAL | Status: DC
  Administered 2011-06-21 – 2011-06-24 (×4): 40 mg via ORAL
  Filled 2011-06-21 (×5): qty 1

## 2011-06-21 MED ORDER — METOPROLOL TARTRATE 50 MG PO TABS
50.0000 mg | ORAL_TABLET | Freq: Two times a day (BID) | ORAL | Status: DC
  Filled 2011-06-21 (×4): qty 1

## 2011-06-21 MED ORDER — DILTIAZEM LOAD VIA INFUSION
10.0000 mg | Freq: Once | INTRAVENOUS | Status: DC
  Filled 2011-06-21: qty 10

## 2011-06-21 MED ORDER — FUROSEMIDE 10 MG/ML IJ SOLN
INTRAMUSCULAR | Status: AC
  Filled 2011-06-21: qty 2

## 2011-06-21 MED ORDER — ACETAMINOPHEN 325 MG PO TABS
650.0000 mg | ORAL_TABLET | Freq: Three times a day (TID) | ORAL | Status: DC
  Administered 2011-06-21 – 2011-06-25 (×11): 650 mg via ORAL
  Filled 2011-06-21 (×14): qty 2

## 2011-06-21 MED ORDER — ACETAMINOPHEN 325 MG PO TABS
650.0000 mg | ORAL_TABLET | ORAL | Status: DC | PRN
  Administered 2011-06-21: 650 mg via ORAL
  Filled 2011-06-21: qty 2

## 2011-06-21 MED ORDER — AMIODARONE HCL 200 MG PO TABS
400.0000 mg | ORAL_TABLET | Freq: Two times a day (BID) | ORAL | Status: DC
  Administered 2011-06-21 – 2011-06-25 (×9): 400 mg via ORAL
  Filled 2011-06-21 (×10): qty 2

## 2011-06-21 MED ORDER — DILTIAZEM HCL 25 MG/5ML IV SOLN: 10.0000 mg | Freq: Once | INTRAVENOUS | Status: DC

## 2011-06-21 MED ORDER — AZITHROMYCIN 500 MG PO TABS
500.0000 mg | ORAL_TABLET | Freq: Every day | ORAL | Status: AC
  Administered 2011-06-21: 500 mg via ORAL
  Filled 2011-06-21: qty 1

## 2011-06-21 MED ORDER — LEVOTHYROXINE SODIUM 88 MCG PO TABS
88.0000 ug | ORAL_TABLET | Freq: Every day | ORAL | Status: DC
  Administered 2011-06-21 – 2011-06-25 (×5): 88 ug via ORAL
  Filled 2011-06-21 (×7): qty 1

## 2011-06-21 MED ORDER — ZOLPIDEM TARTRATE 5 MG PO TABS
5.0000 mg | ORAL_TABLET | Freq: Every evening | ORAL | Status: DC | PRN
  Administered 2011-06-21 – 2011-06-24 (×3): 5 mg via ORAL
  Filled 2011-06-21 (×3): qty 1

## 2011-06-21 MED ORDER — ASPIRIN EC 81 MG PO TBEC
81.0000 mg | DELAYED_RELEASE_TABLET | Freq: Every day | ORAL | Status: DC
  Administered 2011-06-21 – 2011-06-25 (×5): 81 mg via ORAL
  Filled 2011-06-21 (×5): qty 1

## 2011-06-21 NOTE — ED Notes (Signed)
Patient placed on Bipap after transfer to bed from EMS.

## 2011-06-21 NOTE — ED Provider Notes (Signed)
History     CSN: [id number]  Arrival date & time 06/21/11  0303   First MD Initiated Contact with Patient 06/21/11 0305      Chief Complaint  Patient presents with  . Shortness of Breath    (Consider location/radiation/quality/duration/timing/severity/associated sxs/prior treatment) The history is provided by the patient and the EMS personnel.   went to bed feeling okay in her normal state of health. She woke up with shortness of breath and difficulty breathing and EMS was called. She is found to be hypertensive with rales and started on CPAP, given nitroglycerin and Lasix. At time she arrives to emergency apartment states she is feeling better still some shortness of breath. She denies any chest pains. No recent cough or fevers. No nausea or vomiting. No abdominal pain. She has a history of paroxysmal atrial fibrillation and congestive heart failure. She takes Coumadin. She was evaluated by her cardiologist yesterday and had a scheduled cardioversion report for successful. No missed medications. She denies any significant lower extremity swelling or pain. Symptoms moderate to severe and now improving.  Past Medical History  Diagnosis Date  . CHF (congestive heart failure)   . Hypothyroidism   . Arthritis   . Dyslipidemia   . Hypertension   . Hx of hemorrhoidectomy   . PAF (paroxysmal atrial fibrillation)   . Cataract     Past Surgical History  Procedure Date  . Eye surgery     CATARACT  . Cardioversion 06/19/2011    Procedure: CARDIOVERSION;  Surgeon: Thurmon Fair, MD;  Location: [facility] OR;  Service: Cardiovascular;  Laterality: N/A;    History reviewed. No pertinent family history.  History  Substance Use Topics  . Smoking status: Not on file  . Smokeless tobacco: Not on file  . Alcohol Use:     OB History    Grav Para Term Preterm Abortions TAB SAB Ect Mult Living                  Review of Systems  Constitutional: Negative for fever and chills.  HENT: Negative  for neck pain and neck stiffness.   Eyes: Negative for pain.  Respiratory: Positive for shortness of breath. Negative for cough and wheezing.   Cardiovascular: Positive for palpitations. Negative for chest pain.  Gastrointestinal: Negative for nausea, vomiting and abdominal pain.  Genitourinary: Negative for dysuria.  Musculoskeletal: Negative for back pain.  Skin: Negative for rash.  Neurological: Negative for headaches.  All other systems reviewed and are negative.    Allergies  Sulfa antibiotics  Home Medications   Current Outpatient Rx  Name Route Sig Dispense Refill  . ACETAMINOPHEN ER 650 MG PO TBCR Oral Take 650 mg by mouth 3 (three) times daily.    . AMIODARONE HCL 200 MG PO TABS Oral Take 200 mg by mouth daily.    Marland Kitchen AMLODIPINE BESYLATE 2.5 MG PO TABS Oral Take 2.5 mg by mouth daily.      . ASPIRIN EC 81 MG PO TBEC Oral Take 81 mg by mouth daily.    . ATORVASTATIN CALCIUM 40 MG PO TABS Oral Take 40 mg by mouth daily.      Marland Kitchen BENAZEPRIL HCL 40 MG PO TABS Oral Take 40 mg by mouth daily.      Marland Kitchen EZETIMIBE 10 MG PO TABS Oral Take 10 mg by mouth daily.      Marland Kitchen FOLIC ACID 1 MG PO TABS Oral Take 2 mg by mouth daily.     . FUROSEMIDE  40 MG PO TABS Oral Take 40 mg by mouth daily.     Marland Kitchen HYDRALAZINE HCL 50 MG PO TABS Oral Take 50 mg by mouth 3 (three) times daily.      Marland Kitchen LEVOTHYROXINE SODIUM 88 MCG PO TABS Oral Take 88 mcg by mouth daily.      Marland Kitchen METOPROLOL TARTRATE 50 MG PO TABS Oral Take 50 mg by mouth 2 (two) times daily.     . CENTRUM SILVER PO TABS Oral Take 1 tablet by mouth daily.      . WARFARIN SODIUM 5 MG PO TABS Oral Take 2.5 mg by mouth daily.      BP 123/66  Pulse 85  Temp(Src) 97.7 F (36.5 C) (Oral)  Resp 19  SpO2 99%  Physical Exam  Constitutional: She is oriented to person, place, and time. She appears well-developed and well-nourished.  HENT:  Head: Normocephalic and atraumatic.  Eyes: Conjunctivae and EOM are normal. Pupils are equal, round, and reactive to  light.  Neck: Full passive range of motion without pain. Neck supple. No JVD present. No thyromegaly present.  Cardiovascular: S1 normal, S2 normal, intact distal pulses and normal pulses.     No systolic murmur is present   No diastolic murmur is present  Pulses:      Radial pulses are 2+ on the right side, and 2+ on the left side.       Tachycardic  Pulmonary/Chest: No stridor. She has no rhonchi.       Mild posterior rales bilaterally. Borderline tachypnea - no respiratory distress  Abdominal: Soft. Normal appearance and bowel sounds are normal. There is no tenderness. There is no CVA tenderness and negative Murphy's sign.  Musculoskeletal: Normal range of motion.       BLE:s Calves nontender, no cords or erythema, negative Homans sign  Neurological: She is alert and oriented to person, place, and time. She has normal strength and normal reflexes. No cranial nerve deficit or sensory deficit. She displays a negative Romberg sign. GCS eye subscore is 4. GCS verbal subscore is 5. GCS motor subscore is 6.       Normal Gait  Skin: Skin is warm and dry. No rash noted. She is not diaphoretic. No cyanosis. Nails show no clubbing.  Psychiatric: She has a normal mood and affect. Her speech is normal and behavior is normal.       Cooperative and appropriate    ED Course  Procedures (including critical care time)  Results for orders placed during the hospital encounter of 06/21/11  CBC      Component Value Range   WBC 14.7 (*) 4.0 - 10.5 (K/uL)   RBC 4.18  3.87 - 5.11 (MIL/uL)   Hemoglobin 12.5  12.0 - 15.0 (g/dL)   HCT 16.1  09.6 - 04.5 (%)   MCV 91.6  78.0 - 100.0 (fL)   MCH 29.9  26.0 - 34.0 (pg)   MCHC 32.6  30.0 - 36.0 (g/dL)   RDW 40.9  81.1 - 91.4 (%)   Platelets 266  150 - 400 (K/uL)  PRO B NATRIURETIC PEPTIDE      Component Value Range   Pro B Natriuretic peptide (BNP) 1043.0 (*) 0 - 450 (pg/mL)  PROTIME-INR      Component Value Range   Prothrombin Time 40.4 (*) 11.6 - 15.2  (seconds)   INR 4.11 (*) 0.00 - 1.49   POCT I-STAT, CHEM 8      Component Value Range   Sodium  142  135 - 145 (mEq/L)   Potassium 3.8  3.5 - 5.1 (mEq/L)   Chloride 108  96 - 112 (mEq/L)   BUN 26 (*) 6 - 23 (mg/dL)   Creatinine, Ser 1.61 (*) 0.50 - 1.10 (mg/dL)   Glucose, Bld 096 (*) 70 - 99 (mg/dL)   Calcium, Ion 0.45  4.09 - 1.32 (mmol/L)   TCO2 25  0 - 100 (mmol/L)   Hemoglobin 13.6  12.0 - 15.0 (g/dL)   HCT 81.1  91.4 - 78.2 (%)  POCT I-STAT TROPONIN I      Component Value Range   Troponin i, poc 0.00  0.00 - 0.08 (ng/mL)   Comment 3             Dg Chest Portable 1 View  06/21/2011  *RADIOLOGY REPORT*  Clinical Data: Shortness of breath.  PORTABLE CHEST - 1 VIEW  Comparison: Chest radiograph performed 06/13/2011  Findings: The lungs are well-aerated.  Mild bibasilar airspace opacities may reflect pneumonia; chronic vascular congestion is noted.  Mild pulmonary edema could conceivably have a similar appearance.  A small left pleural effusion is seen.  There is no evidence of pneumothorax.  Mild scarring is noted at the lung apices.  The cardiomediastinal silhouette is borderline normal in size.  No acute osseous abnormalities are seen.  A left shoulder hemiarthroplasty is again noted.   IMPRESSION: Mild bibasilar airspace opacities raise concern for pneumonia; chronic vascular congestion and small left pleural effusion noted. Mild pulmonary edema could conceivably have a similar appearance.  Original Report Authenticated By: Tonia Ghent, M.D.    Date: 06/21/2011  Rate: 115  Rhythm: atrial fibrillation  QRS Axis: normal  Intervals: normal  ST/T Wave abnormalities: nonspecific ST/T changes  Conduction Disutrbances:left bundle branch block  Narrative Interpretation: Previous EKG demonstrates normal sinus rhythm rate of 64 with first degree AV block dated 07/10/2010  Old EKG Reviewed: changes noted  Shortly after EKG reviewed as above, heart rate normalized and complexes changed on  the monitor. Repeat EKG demonstrates regular narrow complex rhythm rate 85. ?atrial activity. Symptoms much improved and CPAP stopped. Room air pulse ox low 90s and placed on 2 L of oxygen by nasal cannula  Chest x-ray labs obtained and reviewed as above. Patient maintained on cardiac monitor.  Case d/w Dr Dannette Barbara on call for Cardiology [facility] who agrees to ED eval and admit. She does not require nitroglycerin drip at this time and has already had bolus Lasix in addition to her home Lasix.  MDM   Presentation suggest flash pulmonary edema secondary to paroxysmal atrial fibrillation that spontaneously converted in the ED. She initially required CPAP, was hypertensive with reported systolic blood pressure over 200 , was given nitroglycerin and Lasix in route. No chest pain. Normal tensive and borderline hypoxic in the ED. Cardiology consultation and admit.         Sunnie Nielsen, MD 06/21/11 [id number]

## 2011-06-21 NOTE — ED Notes (Signed)
Pt placed in gown, on monitor, with continuous blood pressure and pulse oximetry 

## 2011-06-21 NOTE — Progress Notes (Signed)
ANTICOAGULATION CONSULT NOTE - Initial Consult  Pharmacy Consult for Coumadin Indication: atrial fibrillation  Allergies  Allergen Reactions  . Sulfa Antibiotics Itching and Rash    Patient Measurements: Height: 5\' 3"  (160 cm) Weight: 146 lb 9.7 oz (66.5 kg) IBW/kg (Calculated) : 52.4   Vital Signs: Temp: 97.5 F (36.4 C) (01/25 0644) Temp src: Oral (01/25 0644) BP: 152/56 mmHg (01/25 0644) Pulse Rate: 59  (01/25 0644)  Labs:  Basename 06/21/11 0342 06/21/11 0319 06/19/11 1226  HGB 13.6 12.5 --  HCT 40.0 38.3 --  PLT -- 266 --  APTT -- -- --  LABPROT -- 40.4* 33.4*  INR -- 4.11* 3.22*  HEPARINUNFRC -- -- --  CREATININE 1.20* -- --  CKTOTAL -- -- --  CKMB -- -- --  TROPONINI -- -- --   Estimated Creatinine Clearance: 31.4 ml/min (by C-G formula based on Cr of 1.2).  Medical History: Past Medical History  Diagnosis Date  . CHF (congestive heart failure)   . Hypothyroidism   . Arthritis   . Dyslipidemia   . Hypertension   . Hx of hemorrhoidectomy   . PAF (paroxysmal atrial fibrillation)   . Cataract     Medications:  Prescriptions prior to admission  Medication Sig Dispense Refill  . acetaminophen (TYLENOL) 650 MG CR tablet Take 650 mg by mouth 3 (three) times daily.      Marland Kitchen amiodarone (PACERONE) 200 MG tablet Take 200 mg by mouth daily.      Marland Kitchen amLODipine (NORVASC) 2.5 MG tablet Take 2.5 mg by mouth daily.        Marland Kitchen aspirin EC 81 MG tablet Take 81 mg by mouth daily.      . benazepril (LOTENSIN) 40 MG tablet Take 40 mg by mouth daily.        Marland Kitchen ezetimibe (ZETIA) 10 MG tablet Take 10 mg by mouth daily.        . folic acid (FOLVITE) 1 MG tablet Take 2 mg by mouth daily.       . furosemide (LASIX) 40 MG tablet Take 40 mg by mouth daily.       . hydrALAZINE (APRESOLINE) 50 MG tablet Take 50 mg by mouth 3 (three) times daily.        Marland Kitchen levothyroxine (SYNTHROID, LEVOTHROID) 88 MCG tablet Take 88 mcg by mouth daily.        . Multiple Vitamins-Minerals (CENTRUM  SILVER) tablet Take 1 tablet by mouth daily.        Marland Kitchen warfarin (COUMADIN) 5 MG tablet Take 2.5 mg by mouth daily.      Marland Kitchen atorvastatin (LIPITOR) 40 MG tablet Take 40 mg by mouth daily.        . metoprolol (LOPRESSOR) 50 MG tablet Take 50 mg by mouth 2 (two) times daily.         Assessment: 78 yo female with Afib for anticoagulation    Noted pt recently changed antiarrhythmics back to amiodarone.  Per H&P 03/2009, Coumadin home regimen was 1.25 mg daily when previously on amiodarone.    Goal of Therapy:  INR 2-3   Plan:  Hold Coumadin for now. F/U daily PT/INR and resume Coumadin at lower daily dose once INR decreases  Eddie Candle 06/21/2011,7:03 AM

## 2011-06-21 NOTE — Progress Notes (Signed)
*  PRELIMINARY RESULTS* Echocardiogram 2D Echocardiogram has been performed.  Glean Salen [facility] 06/21/2011, 10:31 AM

## 2011-06-21 NOTE — ED Notes (Signed)
Patient taken off Bipap for room air sat check

## 2011-06-21 NOTE — ED Notes (Signed)
Cardiology MD at bedside.

## 2011-06-21 NOTE — ED Notes (Signed)
Patient awoke this am with shortness of breath with rales.  Patient was given 3SL nitro, 4mg  morphine and 33mg  Lasix.  Patient on CPAP enroute to ED.

## 2011-06-21 NOTE — H&P (Signed)
[facility]    ADMISSION HISTORY & PHYSICAL   Chief Complaint:  Rapid heartbeat, acute shortness of breath  HPI:  This is a 78 y.o. female with a past medical history significant for paroxysmal atrial fibrillation for many years, previously on amiodarone for 11 years and had shortness of breath, so was switched to sotalol. Unfortunately she broke through this with a-fib and RVR in the office. Generally her episodes last from 48-72 hours.  Dr. Clarene Duke considered starting her on Multaq, however, she was started back on amiodarone and sent for cardioversion. She was successfully cardioverted by Dr. Royann Shivers on 06/19/2011 with one shock back to sinus bradycardia. Over the past few days she has been having respiratory symptoms of cough and wheezing, along with chills, but no fevers or sweats.  This evening she was asleep and awakened suddenly with marked shortness of breath.  She could not lie flat and EMS was called. She presented in a-fib with RVR, required bipap, lasix and nitroglycerin. She was hypertensive at the time as well, with systolic BP >200.  PMHx:  Past Medical History  Diagnosis Date  . CHF (congestive heart failure)   . Hypothyroidism   . Arthritis   . Dyslipidemia   . Hypertension   . Hx of hemorrhoidectomy   . PAF (paroxysmal atrial fibrillation)   . Cataract     Past Surgical History  Procedure Date  . Eye surgery     CATARACT  . Cardioversion 06/19/2011    Procedure: CARDIOVERSION;  Surgeon: Thurmon Fair, MD;  Location: [facility] OR;  Service: Cardiovascular;  Laterality: N/A;    FAMHx:  History reviewed. No pertinent family history.  SOCHx:   does not have a smoking history on file. She does not have any smokeless tobacco history on file. Her alcohol and drug histories not on file.  ALLERGIES:  Allergies  Allergen Reactions  . Sulfa Antibiotics Itching and Rash    ROS: A comprehensive review of systems was negative except  for: Constitutional: positive for chills and malaise Respiratory: positive for dyspnea on exertion and wheezing, cough Cardiovascular: positive for dyspnea and palpitations Behavioral/Psych: positive for anxiety  HOME MEDS: Medications Prior to Admission  Medication Dose Route Frequency Provider Last Rate Last Dose  . furosemide (LASIX) 10 MG/ML injection           . furosemide (LASIX) 10 MG/ML injection           . DISCONTD: diltiazem (CARDIZEM) 1 mg/mL load via infusion 10 mg  10 mg Intravenous Once Sunnie Nielsen, MD      . DISCONTD: diltiazem (CARDIZEM) 100 mg in dextrose 5 % 100 mL infusion  5-15 mg/hr Intravenous Titrated Sunnie Nielsen, MD      . DISCONTD: diltiazem (CARDIZEM) 100 mg in dextrose 5 % 100 mL infusion  5-15 mg/hr Intravenous Continuous Sunnie Nielsen, MD      . DISCONTD: diltiazem (CARDIZEM) injection 10 mg  10 mg Intravenous Once Sunnie Nielsen, MD       Medications Prior to Admission  Medication Sig Dispense Refill  . acetaminophen (TYLENOL) 650 MG CR tablet Take 650 mg by mouth 3 (three) times daily.      Marland Kitchen amiodarone (PACERONE) 200 MG tablet Take 200 mg by mouth daily.      Marland Kitchen amLODipine (NORVASC) 2.5 MG tablet Take 2.5 mg by mouth daily.        Marland Kitchen aspirin EC 81 MG tablet Take 81 mg by mouth daily.      Marland Kitchen  atorvastatin (LIPITOR) 40 MG tablet Take 40 mg by mouth daily.        . benazepril (LOTENSIN) 40 MG tablet Take 40 mg by mouth daily.        Marland Kitchen ezetimibe (ZETIA) 10 MG tablet Take 10 mg by mouth daily.        . folic acid (FOLVITE) 1 MG tablet Take 2 mg by mouth daily.       . furosemide (LASIX) 40 MG tablet Take 40 mg by mouth daily.       . hydrALAZINE (APRESOLINE) 50 MG tablet Take 50 mg by mouth 3 (three) times daily.        Marland Kitchen levothyroxine (SYNTHROID, LEVOTHROID) 88 MCG tablet Take 88 mcg by mouth daily.        . metoprolol (LOPRESSOR) 50 MG tablet Take 50 mg by mouth 2 (two) times daily.       . Multiple Vitamins-Minerals (CENTRUM SILVER) tablet Take 1 tablet by mouth  daily.        Marland Kitchen warfarin (COUMADIN) 5 MG tablet Take 2.5 mg by mouth daily.        LABS/IMAGING: Results for orders placed during the hospital encounter of 06/21/11 (from the past 48 hour(s))  CBC     Status: Abnormal   Collection Time   06/21/11  3:19 AM      Component Value Range Comment   WBC 14.7 (*) 4.0 - 10.5 (K/uL)    RBC 4.18  3.87 - 5.11 (MIL/uL)    Hemoglobin 12.5  12.0 - 15.0 (g/dL)    HCT 40.9  81.1 - 91.4 (%)    MCV 91.6  78.0 - 100.0 (fL)    MCH 29.9  26.0 - 34.0 (pg)    MCHC 32.6  30.0 - 36.0 (g/dL)    RDW 78.2  95.6 - 21.3 (%)    Platelets 266  150 - 400 (K/uL)   PRO B NATRIURETIC PEPTIDE     Status: Abnormal   Collection Time   06/21/11  3:19 AM      Component Value Range Comment   Pro B Natriuretic peptide (BNP) 1043.0 (*) 0 - 450 (pg/mL)   PROTIME-INR     Status: Abnormal   Collection Time   06/21/11  3:19 AM      Component Value Range Comment   Prothrombin Time 40.4 (*) 11.6 - 15.2 (seconds)    INR 4.11 (*) 0.00 - 1.49    POCT I-STAT TROPONIN I     Status: Normal   Collection Time   06/21/11  3:41 AM      Component Value Range Comment   Troponin i, poc 0.00  0.00 - 0.08 (ng/mL)    Comment 3            POCT I-STAT, CHEM 8     Status: Abnormal   Collection Time   06/21/11  3:42 AM      Component Value Range Comment   Sodium 142  135 - 145 (mEq/L)    Potassium 3.8  3.5 - 5.1 (mEq/L)    Chloride 108  96 - 112 (mEq/L)    BUN 26 (*) 6 - 23 (mg/dL)    Creatinine, Ser 0.86 (*) 0.50 - 1.10 (mg/dL)    Glucose, Bld 578 (*) 70 - 99 (mg/dL)    Calcium, Ion 4.69  1.12 - 1.32 (mmol/L)    TCO2 25  0 - 100 (mmol/L)    Hemoglobin 13.6  12.0 - 15.0 (g/dL)  HCT 40.0  36.0 - 46.0 (%)    Dg Chest Portable 1 View  06/21/2011  *RADIOLOGY REPORT*  Clinical Data: Shortness of breath.  PORTABLE CHEST - 1 VIEW  Comparison: Chest radiograph performed 06/13/2011  Findings: The lungs are well-aerated.  Mild bibasilar airspace opacities may reflect pneumonia; chronic vascular  congestion is noted.  Mild pulmonary edema could conceivably have a similar appearance.  A small left pleural effusion is seen.  There is no evidence of pneumothorax.  Mild scarring is noted at the lung apices.  The cardiomediastinal silhouette is borderline normal in size.  No acute osseous abnormalities are seen.  A left shoulder hemiarthroplasty is again noted.  IMPRESSION: Mild bibasilar airspace opacities raise concern for pneumonia; chronic vascular congestion and small left pleural effusion noted. Mild pulmonary edema could conceivably have a similar appearance.  Original Report Authenticated By: Tonia Ghent, M.D.   EKG: a-fib with RVR on admission, now sinus bradycardia at 57  VITALS: Blood pressure 123/50, pulse 58, temperature 97.7 F (36.5 C), temperature source Oral, resp. rate 16, SpO2 94.00%.  EXAM: General appearance: alert and no distress Neck: JVD - 3 cm above sternal notch, no adenopathy, no carotid bruit, supple, symmetrical, trachea midline and thyroid not enlarged, symmetric, no tenderness/mass/nodules Lungs: wheezes bibasilar Heart: regular rate and rhythm, S1, S2 normal, no murmur, click, rub or gallop Abdomen: soft, non-tender; bowel sounds normal; no masses,  no organomegaly Extremities: extremities normal, atraumatic, no cyanosis or edema Pulses: 2+ and symmetric Skin: Skin color, texture, turgor normal. No rashes or lesions Neurologic: Grossly normal  IMPRESSION: 1. Atrial fibrillation with rapid ventricular response 2. Flash pulmonary edema secondary to hypertension, tachycardia and mixed cardiomyopathy 3. Acute on chronic systolic/diastolic heart failure exacerbation, EF 45% by cath 2011 4. Hypertensive emergency 5. Supratherapeutic INR 6. Leukocytosis/cough - possible early pneumonia  PLAN: 1.  Admit for further diuresis and medicine optimization. 2.  Increase amiodarone to 400 mg po BID for loading. 3.  Recheck 2D echo to assess systolic/diastolic  function. 4.  May need evaluation for a-fib ablation. 5.  Hold warfarin until INR <3.0 6.  Check influenza swab (she was vaccinated) - would empirically start a course of azithromycin for possibly early pneumonia/bronchitis.  Chrystie Nose, MD Attending Cardiologist [facility]  HILTY,Kenneth C 06/21/2011, 5:36 AM

## 2011-06-22 ENCOUNTER — Inpatient Hospital Stay (HOSPITAL_COMMUNITY): Payer: Medicare Other

## 2011-06-22 LAB — CBC
MCV: 91.7 fL (ref 78.0–100.0)
Platelets: 237 10*3/uL (ref 150–400)
RDW: 14.2 % (ref 11.5–15.5)
WBC: 9.3 10*3/uL (ref 4.0–10.5)

## 2011-06-22 LAB — CARDIAC PANEL(CRET KIN+CKTOT+MB+TROPI): CK, MB: 2.8 ng/mL (ref 0.3–4.0)

## 2011-06-22 LAB — PROTIME-INR
INR: 4.25 — ABNORMAL HIGH (ref 0.00–1.49)
Prothrombin Time: 41.5 seconds — ABNORMAL HIGH (ref 11.6–15.2)

## 2011-06-22 LAB — BASIC METABOLIC PANEL
GFR calc Af Amer: 62 mL/min — ABNORMAL LOW (ref >90)
GFR calc non Af Amer: 53 mL/min — ABNORMAL LOW (ref >90)
Potassium: 3.8 mEq/L (ref 3.5–5.1)
Sodium: 141 mEq/L (ref 135–145)

## 2011-06-22 MED ORDER — METOPROLOL TARTRATE 50 MG PO TABS
50.0000 mg | ORAL_TABLET | Freq: Two times a day (BID) | ORAL | Status: DC
  Filled 2011-06-22 (×2): qty 1

## 2011-06-22 MED ORDER — METOPROLOL TARTRATE 25 MG PO TABS
25.0000 mg | ORAL_TABLET | Freq: Two times a day (BID) | ORAL | Status: DC
  Administered 2011-06-22 – 2011-06-25 (×5): 25 mg via ORAL
  Filled 2011-06-22 (×8): qty 1

## 2011-06-22 NOTE — Progress Notes (Signed)
ANTICOAGULATION CONSULT NOTE - Initial Consult  Pharmacy Consult for Coumadin Indication: atrial fibrillation  Allergies  Allergen Reactions  . Sulfa Antibiotics Itching and Rash    Patient Measurements: Height: 5\' 3"  (160 cm) Weight: 147 lb 0.8 oz (66.7 kg) IBW/kg (Calculated) : 52.4   Vital Signs: Temp: 97.9 F (36.6 C) (01/26 1100) Temp src: Oral (01/26 1100) BP: 99/43 mmHg (01/26 1400) Pulse Rate: 52  (01/26 1400)  Labs:  Basename 06/22/11 0900 06/22/11 0700 06/22/11 0104 06/21/11 1640 06/21/11 0849 06/21/11 0848 06/21/11 0342 06/21/11 0319  HGB 11.1* -- -- -- -- -- 13.6 --  HCT 34.2* -- -- -- -- -- 40.0 38.3  PLT 237 -- -- -- -- -- -- 266  APTT -- -- -- -- 48* -- -- --  LABPROT -- 41.5* -- -- 39.0* -- -- 40.4*  INR -- 4.25* -- -- 3.93* -- -- 4.11*  HEPARINUNFRC -- -- -- -- -- -- -- --  CREATININE -- 0.95 -- -- 1.03 -- 1.20* --  CKTOTAL -- -- 45 54 -- 60 -- --  CKMB -- -- 2.8 3.4 -- 3.6 -- --  TROPONINI -- -- <0.30 <0.30 -- <0.30 -- --   Estimated Creatinine Clearance: 39.7 ml/min (by C-G formula based on Cr of 0.95).  Medical History: Past Medical History  Diagnosis Date  . CHF (congestive heart failure)   . Hypothyroidism   . Arthritis   . Dyslipidemia   . Hypertension   . Hx of hemorrhoidectomy   . PAF (paroxysmal atrial fibrillation)   . Cataract     Medications:  Prescriptions prior to admission  Medication Sig Dispense Refill  . acetaminophen (TYLENOL) 650 MG CR tablet Take 650 mg by mouth 3 (three) times daily.      Marland Kitchen amiodarone (PACERONE) 200 MG tablet Take 200 mg by mouth daily.      Marland Kitchen amLODipine (NORVASC) 2.5 MG tablet Take 2.5 mg by mouth daily.        Marland Kitchen aspirin EC 81 MG tablet Take 81 mg by mouth daily.      . benazepril (LOTENSIN) 40 MG tablet Take 40 mg by mouth daily.        Marland Kitchen ezetimibe (ZETIA) 10 MG tablet Take 10 mg by mouth daily.        . folic acid (FOLVITE) 1 MG tablet Take 2 mg by mouth daily.       . furosemide (LASIX) 40 MG  tablet Take 40 mg by mouth daily.       . hydrALAZINE (APRESOLINE) 50 MG tablet Take 50 mg by mouth 3 (three) times daily.        Marland Kitchen levothyroxine (SYNTHROID, LEVOTHROID) 88 MCG tablet Take 88 mcg by mouth daily.        . Multiple Vitamins-Minerals (CENTRUM SILVER) tablet Take 1 tablet by mouth daily.        Marland Kitchen warfarin (COUMADIN) 5 MG tablet Take 2.5 mg by mouth daily.      Marland Kitchen atorvastatin (LIPITOR) 40 MG tablet Take 40 mg by mouth daily.        . metoprolol (LOPRESSOR) 50 MG tablet Take 50 mg by mouth 2 (two) times daily.         Assessment: 78 yo female with Afib for anticoagulation    Noted pt recently changed antiarrhythmics back to amiodarone.  Per H&P 03/2009, Coumadin home regimen was 1.25 mg daily when previously on amiodarone.  INR still remains supratherapeutic. Will continue to hold Coumadin  Goal of  Therapy:  INR 2-3   Plan:  Hold Coumadin for now. F/U daily PT/INR and resume Coumadin at lower daily dose once INR decreases  Janace Litten, PharmD 06/22/2011,2:38 PM

## 2011-06-22 NOTE — Progress Notes (Signed)
[facility]  Subjective:   Objective: Vital signs in last 24 hours: Temp:  [97.4 F (36.3 C)-98.1 F (36.7 C)] 97.7 F (36.5 C) (01/26 0745) Pulse Rate:  [48-111] 51  (01/26 0800) Resp:  [12-22] 12  (01/26 0800) BP: (65-121)/(25-69) 108/46 mmHg (01/26 0800) SpO2:  [95 %-100 %] 100 % (01/26 0800) Weight:  [66.7 kg (147 lb 0.8 oz)] 66.7 kg (147 lb 0.8 oz) (01/26 0500) Last BM Date: 06/20/11  Intake/Output from previous day: 01/25 0701 - 01/26 0700 In: 810 [P.O.:800; IV Piggyback:10] Out: 2125 [Urine:2125] Intake/Output this shift:    Medications Current Facility-Administered Medications  Medication Dose Route Frequency Provider Last Rate Last Dose  . acetaminophen (TYLENOL) tablet 650 mg  650 mg Oral TID Chrystie Nose, MD   650 mg at 06/21/11 2222  . acetaminophen (TYLENOL) tablet 650 mg  650 mg Oral Q4H PRN Chrystie Nose, MD   650 mg at 06/21/11 1608  . ALPRAZolam Prudy Feeler) tablet 0.25 mg  0.25 mg Oral BID PRN Chrystie Nose, MD      . amiodarone (PACERONE) tablet 400 mg  400 mg Oral BID Chrystie Nose, MD   400 mg at 06/21/11 2220  . aspirin EC tablet 81 mg  81 mg Oral Daily Chrystie Nose, MD   81 mg at 06/21/11 0936  . atorvastatin (LIPITOR) tablet 40 mg  40 mg Oral q1800 Chrystie Nose, MD   40 mg at 06/21/11 1848  . azithromycin (ZITHROMAX) tablet 500 mg  500 mg Oral Daily Chrystie Nose, MD   500 mg at 06/21/11 1610   Followed by  . azithromycin (ZITHROMAX) tablet 250 mg  250 mg Oral Daily Chrystie Nose, MD      . benazepril (LOTENSIN) tablet 40 mg  40 mg Oral Daily Marykay Lex, MD   40 mg at 06/21/11 0936  . ezetimibe (ZETIA) tablet 10 mg  10 mg Oral Daily Chrystie Nose, MD   10 mg at 06/21/11 0936  . folic acid (FOLVITE) tablet 2 mg  2 mg Oral Daily Chrystie Nose, MD   2 mg at 06/21/11 0936  . furosemide (LASIX) 10 MG/ML injection           . furosemide (LASIX) injection 40 mg  40 mg Intravenous BID Chrystie Nose, MD   40 mg at 06/22/11 0820  . hydrALAZINE (APRESOLINE) tablet 50 mg  50 mg Oral TID Marykay Lex, MD   50 mg at 06/21/11 0936  . levothyroxine (SYNTHROID, LEVOTHROID) tablet 88 mcg  88 mcg Oral QAC breakfast Chrystie Nose, MD   88 mcg at 06/21/11 0936  . metoprolol tartrate (LOPRESSOR) tablet 25 mg  25 mg Oral BID Marykay Lex, MD      . mulitivitamin with minerals tablet 1 tablet  1 tablet Oral Daily Chrystie Nose, MD   1 tablet at 06/21/11 [phone]  . ondansetron (ZOFRAN) injection 4 mg  4 mg Intravenous Q6H PRN Chrystie Nose, MD      . sodium chloride 0.9 % injection 3 mL  3 mL Intravenous PRN Chrystie Nose, MD      . zolpidem (AMBIEN) tablet 5 mg  5 mg Oral QHS PRN Abelino Derrick, PA   5 mg at 06/21/11 2225  . DISCONTD: amLODipine (NORVASC) tablet 2.5 mg  2.5 mg Oral Daily Chrystie Nose, MD   2.5 mg at 06/21/11 0936  .  DISCONTD: metoprolol (LOPRESSOR) tablet 50 mg  50 mg Oral BID Chrystie Nose, MD      . DISCONTD: metoprolol (LOPRESSOR) tablet 50 mg  50 mg Oral BID Marykay Lex, MD        PE: Gen: NCAT, in NAD Heart: RRR, with 1/6 outflow murmur Lungs: Clear anteriorly Ext: No edema  Lab Results:   Basename 06/21/11 0342 06/21/11 0319  WBC -- 14.7*  HGB 13.6 12.5  HCT 40.0 38.3  PLT -- 266   BMET  Basename 06/22/11 0700 06/21/11 0849 06/21/11 0342  NA 141 142 142  K 3.8 3.7 3.8  CL 106 104 108  CO2 27 26 --  GLUCOSE 91 125* 156*  BUN 27* 21 26*  CREATININE 0.95 1.03 1.20*  CALCIUM 8.6 9.0 --   PT/INR  Basename 06/22/11 0700 06/21/11 0849 06/21/11 0319  LABPROT 41.5* 39.0* 40.4*  INR 4.25* 3.93* 4.11*    Studies/Results: PORTABLE CHEST - 1 VIEW  Comparison: 06/21/2011 and 06/03/2011.  Findings: 0659 hours. There is persistent left lower lobe air  space disease with volume loss. There is a probable small  associated left pleural effusion. The right lung is clear. The  heart size and mediastinal contours are stable. There is stable    biapical pleural parenchymal scarring. The patient is status post  left total shoulder arthroplasty.  IMPRESSION:  Persistent left lower lobe air space disease and small left pleural  effusion concerning for pneumonia. Overall basilar aeration has  slightly improved.  Assessment/Plan   Principal Problem:  *Paroxysmal atrial fibrillation Active Problems:  Pulmonary edema  Systolic and diastolic CHF, acute on chronic  Hyperlipidemia  HTN (hypertension)  Hypothyroidism  Coronary artery disease   Plan:  Now in NSR after increased Amiodarone.  INR supratherapeutic.  Continue to hold coumadin.  Will get CBC this AM to check WBCs.  CXR concerning for LLL airspace disease. BNP and CXR tomorrow.  Continue IV lasix today.  Switch to PO tomorrow.   LOS: 1 day    [name],[name] 06/22/2011 8:28 AM .  Agree with note written by [name] [facility]  Admitted with tachy ? Aflutter with 2:1 s/p OP DCCV by Dr. Salena Saner 1/23 on amio. Quickly converted to SR/SB. Appeared to be in mild CHF which has responded nicely to iv diuresis. On po Zithromax for ? CAPNA with slightly increased WBC but an unimpressive CXR. Amio dose increased for loading purposes by Dr. Rennis Golden. INR supra therapeutic and coumadin is on hold and being monitored by pharmacy. Will keep in TCU today and transfer to tele tomorrow. Transition back to po diuretics tomorrow and probably home on Monday.   [name] 06/22/2011 8:38 AM

## 2011-06-23 ENCOUNTER — Inpatient Hospital Stay (HOSPITAL_COMMUNITY): Payer: Medicare Other

## 2011-06-23 LAB — BASIC METABOLIC PANEL
BUN: 26 mg/dL — ABNORMAL HIGH (ref 6–23)
BUN: 29 mg/dL — ABNORMAL HIGH (ref 6–23)
Calcium: 8.9 mg/dL (ref 8.4–10.5)
Creatinine, Ser: 1.2 mg/dL — ABNORMAL HIGH (ref 0.50–1.10)
GFR calc Af Amer: 46 mL/min — ABNORMAL LOW (ref >90)
GFR calc non Af Amer: 40 mL/min — ABNORMAL LOW (ref >90)
GFR calc non Af Amer: 48 mL/min — ABNORMAL LOW (ref >90)
Glucose, Bld: 92 mg/dL (ref 70–99)
Potassium: 3.5 mEq/L (ref 3.5–5.1)

## 2011-06-23 LAB — CBC
MCH: 29.2 pg (ref 26.0–34.0)
MCV: 91.1 fL (ref 78.0–100.0)
Platelets: 233 10*3/uL (ref 150–400)
RDW: 14 % (ref 11.5–15.5)
WBC: 8.2 10*3/uL (ref 4.0–10.5)

## 2011-06-23 LAB — PRO B NATRIURETIC PEPTIDE: Pro B Natriuretic peptide (BNP): 683.1 pg/mL — ABNORMAL HIGH (ref 0–450)

## 2011-06-23 MED ORDER — FUROSEMIDE 40 MG PO TABS
40.0000 mg | ORAL_TABLET | Freq: Every day | ORAL | Status: DC
  Administered 2011-06-23 – 2011-06-25 (×3): 40 mg via ORAL
  Filled 2011-06-23 (×4): qty 1

## 2011-06-23 NOTE — Progress Notes (Signed)
Subjective:  Feels clinically improved. No CP/SOB  Objective:  Temp:  [97.7 F (36.5 C)-98.7 F (37.1 C)] 98.4 F (36.9 C) (01/27 0400) Pulse Rate:  [49-102] 49  (01/26 1800) Resp:  [12-19] 16  (01/26 2338) BP: (93-115)/(37-68) 103/44 mmHg (01/27 0400) SpO2:  [94 %-100 %] 94 % (01/27 0400) Weight change:   Intake/Output from previous day: 01/26 0701 - 01/27 0700 In: 972 [P.O.:960; IV Piggyback:12] Out: 2275 [Urine:2275]  Intake/Output from this shift:    Physical Exam: General appearance: alert and cooperative Neck: no adenopathy, no carotid bruit, no JVD, supple, symmetrical, trachea midline and thyroid not enlarged, symmetric, no tenderness/mass/nodules Lungs: clear to auscultation bilaterally Heart: regular rate and rhythm, S1, S2 normal, no murmur, click, rub or gallop Extremities: extremities normal, atraumatic, no cyanosis or edema  Lab Results: Results for orders placed during the hospital encounter of 06/21/11 (from the past 48 hour(s))  INFLUENZA PANEL BY PCR     Status: Normal   Collection Time   06/21/11  8:35 AM      Component Value Range Comment   Influenza A By PCR NEGATIVE  NEGATIVE     Influenza B By PCR NEGATIVE  NEGATIVE     H1N1 flu by pcr NOT DETECTED  NOT DETECTED    CARDIAC PANEL(CRET KIN+CKTOT+MB+TROPI)     Status: Normal   Collection Time   06/21/11  8:48 AM      Component Value Range Comment   Total CK 60  7 - 177 (U/L)    CK, MB 3.6  0.3 - 4.0 (ng/mL)    Troponin I <0.30  <0.30 (ng/mL)    Relative Index RELATIVE INDEX IS INVALID  0.0 - 2.5    COMPREHENSIVE METABOLIC PANEL     Status: Abnormal   Collection Time   06/21/11  8:49 AM      Component Value Range Comment   Sodium 142  135 - 145 (mEq/L)    Potassium 3.7  3.5 - 5.1 (mEq/L)    Chloride 104  96 - 112 (mEq/L)    CO2 26  19 - 32 (mEq/L)    Glucose, Bld 125 (*) 70 - 99 (mg/dL)    BUN 21  6 - 23 (mg/dL)    Creatinine, Ser 4.09  0.50 - 1.10 (mg/dL)    Calcium 9.0  8.4 - 10.5 (mg/dL)      Total Protein 7.6  6.0 - 8.3 (g/dL)    Albumin 3.9  3.5 - 5.2 (g/dL)    AST 24  0 - 37 (U/L)    ALT 29  0 - 35 (U/L)    Alkaline Phosphatase 62  39 - 117 (U/L)    Total Bilirubin 0.7  0.3 - 1.2 (mg/dL)    GFR calc non Af Amer 48 (*) >90 (mL/min)    GFR calc Af Amer 56 (*) >90 (mL/min)   APTT     Status: Abnormal   Collection Time   06/21/11  8:49 AM      Component Value Range Comment   aPTT 48 (*) 24 - 37 (seconds)   PROTIME-INR     Status: Abnormal   Collection Time   06/21/11  8:49 AM      Component Value Range Comment   Prothrombin Time 39.0 (*) 11.6 - 15.2 (seconds)    INR 3.93 (*) 0.00 - 1.49    TSH     Status: Abnormal   Collection Time   06/21/11  8:49 AM  Component Value Range Comment   TSH 4.939 (*) 0.350 - 4.500 (uIU/mL)   CARDIAC PANEL(CRET KIN+CKTOT+MB+TROPI)     Status: Normal   Collection Time   06/21/11  4:40 PM      Component Value Range Comment   Total CK 54  7 - 177 (U/L)    CK, MB 3.4  0.3 - 4.0 (ng/mL)    Troponin I <0.30  <0.30 (ng/mL)    Relative Index RELATIVE INDEX IS INVALID  0.0 - 2.5    CARDIAC PANEL(CRET KIN+CKTOT+MB+TROPI)     Status: Normal   Collection Time   06/22/11  1:04 AM      Component Value Range Comment   Total CK 45  7 - 177 (U/L)    CK, MB 2.8  0.3 - 4.0 (ng/mL)    Troponin I <0.30  <0.30 (ng/mL)    Relative Index RELATIVE INDEX IS INVALID  0.0 - 2.5    BASIC METABOLIC PANEL     Status: Abnormal   Collection Time   06/22/11  7:00 AM      Component Value Range Comment   Sodium 141  135 - 145 (mEq/L)    Potassium 3.8  3.5 - 5.1 (mEq/L)    Chloride 106  96 - 112 (mEq/L)    CO2 27  19 - 32 (mEq/L)    Glucose, Bld 91  70 - 99 (mg/dL)    BUN 27 (*) 6 - 23 (mg/dL)    Creatinine, Ser 8.29  0.50 - 1.10 (mg/dL)    Calcium 8.6  8.4 - 10.5 (mg/dL)    GFR calc non Af Amer 53 (*) >90 (mL/min)    GFR calc Af Amer 62 (*) >90 (mL/min)   PROTIME-INR     Status: Abnormal   Collection Time   06/22/11  7:00 AM      Component Value Range  Comment   Prothrombin Time 41.5 (*) 11.6 - 15.2 (seconds)    INR 4.25 (*) 0.00 - 1.49    CBC     Status: Abnormal   Collection Time   06/22/11  9:00 AM      Component Value Range Comment   WBC 9.3  4.0 - 10.5 (K/uL)    RBC 3.73 (*) 3.87 - 5.11 (MIL/uL)    Hemoglobin 11.1 (*) 12.0 - 15.0 (g/dL) DELTA CHECK NOTED   HCT 34.2 (*) 36.0 - 46.0 (%)    MCV 91.7  78.0 - 100.0 (fL)    MCH 29.8  26.0 - 34.0 (pg)    MCHC 32.5  30.0 - 36.0 (g/dL)    RDW 56.2  13.0 - 86.5 (%)    Platelets 237  150 - 400 (K/uL)   PRO B NATRIURETIC PEPTIDE     Status: Abnormal   Collection Time   06/22/11 11:09 PM      Component Value Range Comment   Pro B Natriuretic peptide (BNP) 683.1 (*) 0 - 450 (pg/mL)   BASIC METABOLIC PANEL     Status: Abnormal   Collection Time   06/22/11 11:09 PM      Component Value Range Comment   Sodium 139  135 - 145 (mEq/L)    Potassium 3.5  3.5 - 5.1 (mEq/L)    Chloride 102  96 - 112 (mEq/L)    CO2 28  19 - 32 (mEq/L)    Glucose, Bld 108 (*) 70 - 99 (mg/dL)    BUN 26 (*) 6 - 23 (mg/dL)    Creatinine, Ser  1.20 (*) 0.50 - 1.10 (mg/dL)    Calcium 9.2  8.4 - 10.5 (mg/dL)    GFR calc non Af Amer 40 (*) >90 (mL/min)    GFR calc Af Amer 46 (*) >90 (mL/min)   PROTIME-INR     Status: Abnormal   Collection Time   06/22/11 11:09 PM      Component Value Range Comment   Prothrombin Time 35.4 (*) 11.6 - 15.2 (seconds)    INR 3.47 (*) 0.00 - 1.49    CBC     Status: Abnormal   Collection Time   06/22/11 11:09 PM      Component Value Range Comment   WBC 8.2  4.0 - 10.5 (K/uL)    RBC 3.60 (*) 3.87 - 5.11 (MIL/uL)    Hemoglobin 10.5 (*) 12.0 - 15.0 (g/dL)    HCT 45.4 (*) 09.8 - 46.0 (%)    MCV 91.1  78.0 - 100.0 (fL)    MCH 29.2  26.0 - 34.0 (pg)    MCHC 32.0  30.0 - 36.0 (g/dL)    RDW 11.9  14.7 - 82.9 (%)    Platelets 233  150 - 400 (K/uL)     Imaging: Imaging results have been reviewed  Assessment/Plan:   1. Principal Problem: 2.  *Paroxysmal atrial fibrillation 3. Active  Problems: 4.  Pulmonary edema 5.  Systolic and diastolic CHF, acute on chronic 6.  Hyperlipidemia 7.  HTN (hypertension) 8.  Hypothyroidism 9.  Coronary artery disease 10.   Time Spent Directly with Patient:  20 minutes  Length of Stay:  LOS: 2 days   Clinically improved. Diuresed. VSS Exam benign. Lungs clear, cor RRR, no periph edema. Remains in NSR. Will change iv to PO diuretics. Transfer to tele. Ambulate. CRH. Probably home AM.  [name] 06/23/2011, 7:08 AM

## 2011-06-23 NOTE — Progress Notes (Signed)
ANTICOAGULATION CONSULT NOTE - Initial Consult  Pharmacy Consult for Coumadin Indication: atrial fibrillation  Allergies  Allergen Reactions  . Sulfa Antibiotics Itching and Rash    Patient Measurements: Height: 5\' 3"  (160 cm) Weight: 145 lb 11.6 oz (66.1 kg) IBW/kg (Calculated) : 52.4   Vital Signs: Temp: 97.8 F (36.6 C) (01/27 0730) Temp src: Oral (01/27 0730) BP: 118/50 mmHg (01/27 0730)  Labs:  Alvira Philips 06/23/11 0837 06/22/11 2309 06/22/11 0900 06/22/11 0700 06/22/11 0104 06/21/11 1640 06/21/11 0849 06/21/11 0848 06/21/11 0342 06/21/11 0319  HGB -- 10.5* 11.1* -- -- -- -- -- -- --  HCT -- 32.8* 34.2* -- -- -- -- -- 40.0 --  PLT -- 233 237 -- -- -- -- -- -- 266  APTT -- -- -- -- -- -- 48* -- -- --  LABPROT 33.2* 35.4* -- 41.5* -- -- -- -- -- --  INR 3.19* 3.47* -- 4.25* -- -- -- -- -- --  HEPARINUNFRC -- -- -- -- -- -- -- -- -- --  CREATININE 1.03 1.20* -- 0.95 -- -- -- -- -- --  CKTOTAL -- -- -- -- 45 54 -- 60 -- --  CKMB -- -- -- -- 2.8 3.4 -- 3.6 -- --  TROPONINI -- -- -- -- <0.30 <0.30 -- <0.30 -- --   Estimated Creatinine Clearance: 36.5 ml/min (by C-G formula based on Cr of 1.03).  Medical History: Past Medical History  Diagnosis Date  . CHF (congestive heart failure)   . Hypothyroidism   . Arthritis   . Dyslipidemia   . Hypertension   . Hx of hemorrhoidectomy   . PAF (paroxysmal atrial fibrillation)   . Cataract     Medications:  Prescriptions prior to admission  Medication Sig Dispense Refill  . acetaminophen (TYLENOL) 650 MG CR tablet Take 650 mg by mouth 3 (three) times daily.      Marland Kitchen amiodarone (PACERONE) 200 MG tablet Take 200 mg by mouth daily.      Marland Kitchen amLODipine (NORVASC) 2.5 MG tablet Take 2.5 mg by mouth daily.        Marland Kitchen aspirin EC 81 MG tablet Take 81 mg by mouth daily.      . benazepril (LOTENSIN) 40 MG tablet Take 40 mg by mouth daily.        Marland Kitchen ezetimibe (ZETIA) 10 MG tablet Take 10 mg by mouth daily.        . folic acid (FOLVITE) 1 MG  tablet Take 2 mg by mouth daily.       . furosemide (LASIX) 40 MG tablet Take 40 mg by mouth daily.       . hydrALAZINE (APRESOLINE) 50 MG tablet Take 50 mg by mouth 3 (three) times daily.        Marland Kitchen levothyroxine (SYNTHROID, LEVOTHROID) 88 MCG tablet Take 88 mcg by mouth daily.        . Multiple Vitamins-Minerals (CENTRUM SILVER) tablet Take 1 tablet by mouth daily.        Marland Kitchen warfarin (COUMADIN) 5 MG tablet Take 2.5 mg by mouth daily.      Marland Kitchen atorvastatin (LIPITOR) 40 MG tablet Take 40 mg by mouth daily.        . metoprolol (LOPRESSOR) 50 MG tablet Take 50 mg by mouth 2 (two) times daily.         Assessment: 78 yo female with Afib for anticoagulation    Noted pt recently changed antiarrhythmics back to amiodarone.  Per H&P 03/2009, Coumadin home regimen was  1.25 mg daily when previously on amiodarone.  INR still remains supratherapeutic. Will continue to hold Coumadin tonight with plan to resume tomorrow pending INR decrease  Goal of Therapy:  INR 2-3   Plan:  Hold Coumadin for now. F/U daily PT/INR and resume Coumadin at lower daily dose once INR decreases (most likely tomorrow)  Janace Litten, PharmD 06/23/2011,10:55 AM

## 2011-06-24 ENCOUNTER — Encounter (HOSPITAL_COMMUNITY): Payer: Self-pay | Admitting: Cardiology

## 2011-06-24 DIAGNOSIS — J9600 Acute respiratory failure, unspecified whether with hypoxia or hypercapnia: Secondary | ICD-10-CM

## 2011-06-24 DIAGNOSIS — I350 Nonrheumatic aortic (valve) stenosis: Secondary | ICD-10-CM

## 2011-06-24 DIAGNOSIS — I10 Essential (primary) hypertension: Secondary | ICD-10-CM | POA: Diagnosis present

## 2011-06-24 HISTORY — DX: Nonrheumatic aortic (valve) stenosis: I35.0

## 2011-06-24 HISTORY — DX: Acute respiratory failure, unspecified whether with hypoxia or hypercapnia: J96.00

## 2011-06-24 LAB — BASIC METABOLIC PANEL
BUN: 30 mg/dL — ABNORMAL HIGH (ref 6–23)
Calcium: 9.1 mg/dL (ref 8.4–10.5)
Creatinine, Ser: 1.07 mg/dL (ref 0.50–1.10)
GFR calc Af Amer: 53 mL/min — ABNORMAL LOW (ref >90)
GFR calc non Af Amer: 46 mL/min — ABNORMAL LOW (ref >90)

## 2011-06-24 MED ORDER — POTASSIUM CHLORIDE CRYS ER 20 MEQ PO TBCR
40.0000 meq | EXTENDED_RELEASE_TABLET | Freq: Once | ORAL | Status: AC
  Administered 2011-06-24: 40 meq via ORAL
  Filled 2011-06-24: qty 2

## 2011-06-24 MED ORDER — WARFARIN 1.25 MG HALF TABLET
1.2500 mg | ORAL_TABLET | Freq: Once | ORAL | Status: AC
  Administered 2011-06-24: 1.25 mg via ORAL
  Filled 2011-06-24: qty 1

## 2011-06-24 NOTE — Progress Notes (Signed)
ANTICOAGULATION CONSULT NOTE - Follow Up  Pharmacy Consult for Coumadin Indication: atrial fibrillation  Allergies  Allergen Reactions  . Sulfa Antibiotics Itching and Rash    Patient Measurements: Height: 5\' 3"  (160 cm) Weight: 144 lb 6.4 oz (65.5 kg) IBW/kg (Calculated) : 52.4   Vital Signs: Temp: 97.7 F (36.5 C) (01/28 0500) BP: 131/67 mmHg (01/28 0949) Pulse Rate: 66  (01/28 0500)  Labs:  Alvira Philips 06/24/11 0645 06/23/11 0837 06/22/11 2309 06/22/11 0900 06/22/11 0104 06/21/11 1640  HGB -- -- 10.5* 11.1* -- --  HCT -- -- 32.8* 34.2* -- --  PLT -- -- 233 237 -- --  APTT -- -- -- -- -- --  LABPROT 27.8* 33.2* 35.4* -- -- --  INR 2.54* 3.19* 3.47* -- -- --  HEPARINUNFRC -- -- -- -- -- --  CREATININE 1.07 1.03 1.20* -- -- --  CKTOTAL -- -- -- -- 45 54  CKMB -- -- -- -- 2.8 3.4  TROPONINI -- -- -- -- <0.30 <0.30   Estimated Creatinine Clearance: 35 ml/min (by C-G formula based on Cr of 1.07).  Medical History: Past Medical History  Diagnosis Date  . CHF (congestive heart failure)   . Hypothyroidism   . Arthritis   . Dyslipidemia   . Hypertension   . Hx of hemorrhoidectomy   . PAF (paroxysmal atrial fibrillation)   . Cataract   .  Acute respiratiory failure requiring BiPap 06/24/2011  . Aortic valvular stenosis, moderate to severe 06/24/2011    Medications:  Prescriptions prior to admission  Medication Sig Dispense Refill  . acetaminophen (TYLENOL) 650 MG CR tablet Take 650 mg by mouth 3 (three) times daily.      Marland Kitchen amiodarone (PACERONE) 200 MG tablet Take 200 mg by mouth daily.      Marland Kitchen amLODipine (NORVASC) 2.5 MG tablet Take 2.5 mg by mouth daily.        Marland Kitchen aspirin EC 81 MG tablet Take 81 mg by mouth daily.      . benazepril (LOTENSIN) 40 MG tablet Take 40 mg by mouth daily.        Marland Kitchen ezetimibe (ZETIA) 10 MG tablet Take 10 mg by mouth daily.        . folic acid (FOLVITE) 1 MG tablet Take 2 mg by mouth daily.       . furosemide (LASIX) 40 MG tablet Take 40 mg by  mouth daily.       . hydrALAZINE (APRESOLINE) 50 MG tablet Take 50 mg by mouth 3 (three) times daily.        Marland Kitchen levothyroxine (SYNTHROID, LEVOTHROID) 88 MCG tablet Take 88 mcg by mouth daily.        . Multiple Vitamins-Minerals (CENTRUM SILVER) tablet Take 1 tablet by mouth daily.        Marland Kitchen warfarin (COUMADIN) 5 MG tablet Take 2.5 mg by mouth daily.      Marland Kitchen atorvastatin (LIPITOR) 40 MG tablet Take 40 mg by mouth daily.        . metoprolol (LOPRESSOR) 50 MG tablet Take 50 mg by mouth 2 (two) times daily.         Medications:  Scheduled:    . acetaminophen  650 mg Oral TID  . amiodarone  400 mg Oral BID  . aspirin EC  81 mg Oral Daily  . atorvastatin  40 mg Oral q1800  . azithromycin  250 mg Oral Daily  . benazepril  40 mg Oral Daily  . ezetimibe  10 mg  Oral Daily  . folic acid  2 mg Oral Daily  . furosemide  40 mg Oral QPC breakfast  . hydrALAZINE  50 mg Oral TID  . levothyroxine  88 mcg Oral QAC breakfast  . metoprolol  25 mg Oral BID  . mulitivitamin with minerals  1 tablet Oral Daily  . potassium chloride  40 mEq Oral Once      Assessment: 78 yo female with Afib for anticoagulation on Coumadin prior to admit ( 2.5mg  daily).  Last taken 06/20/11.  Noted pt recently changed antiarrhythmics back to amiodarone.  Per H&P 03/2009, Coumadin home regimen was 1.25 mg daily when previously on amiodarone.  INR = 2.54 is therapeutic today after Coumadin doses held due to supratherapeutic INR. No bleeding reported. Continues on antibiotic, Azithromycin thru 06/25/11. Coumadin effect can be increased by amiodarone and azithromycin.  Goal of Therapy:  INR 2-3   Plan:  Coumadin 1.25 mg today.  Daily INR.   Arman Filter, RPh 06/24/2011,12:06 PM

## 2011-06-24 NOTE — Progress Notes (Signed)
06/24/11 1530 UR Completed. Tera Mater, RN, BSN

## 2011-06-24 NOTE — Clinical Documentation Improvement (Signed)
Hypertension Documentation Clarification Query  THIS DOCUMENT IS NOT A PERMANENT PART OF THE MEDICAL RECORD  TO RESPOND TO THE THIS QUERY, FOLLOW THE INSTRUCTIONS BELOW:  1. If needed, update documentation for the patient's encounter via the notes activity.  2. Access this query again and click edit on the In [employer].  3. After updating, or not, click F2 to complete all highlighted (required) fields concerning your review. Select "additional documentation in the medical record" OR "no additional documentation provided".  4. Click Sign note button.  5. The deficiency will fall out of your In Basket *Please let us know if you are not able to complete this workflow by phone or e-mail (listed below).  Please update the documentation in the record if needed        06/24/11  Dear Dr. Rennis Golden Marton Redwood  In an effort to better capture your patient's severity of illness, reflect appropriate length of stay and utilization of resources, a review of the patient medical record has revealed the following indicators. Based on your clinical judgment, please clarify and document in a progress note and/or discharge summary the clinical condition associated with the following supporting information:In responding to this query please exercise your independent judgment.  The fact that a query is asked, does not imply that any particular answer is desired or expected.  Possible Clinical Conditions?   " Hypertension  " Accelerated Hypertension - added to problem list, and noted as resolved in note.  " Malignant Hypertension  " Or Other Condition __________________________  " Cannot Clinically Determine   Supporting Information:   Risk Factors: pmh: htn, afib, chf   Signs & Symptoms: "Hypertensive emergency" per H&P  SBP range: "was hypertensive with reported systolic blood pressure over 200" per H&P  Diagnostics: Echo: Lt Ventricle: There is likely restrictive diastolic dysfunction. The "a"  wave is small, but seems present, and the E/A ratio is >4. The mitral DT is150-170 msec.The E/e' rato is >20, suggesting elevated LV filling pressure. Aortic valve: Moderately calcified with likely moderate to severe stenosis.  Radiology: Chest radiograph performed 06/13/2011 Findings: The lungs are well-aerated. Mild bibasilar airspace opacities may reflect pneumonia; chronic vascular congestion is noted. Mild pulmonary edema could conceivably have a similar appearance. A small left pleural effusion is seen. There is no evidence of pneumothorax. Mild scarring is noted at the lung apices. The cardiomediastinal silhouette is borderline normal in size. No acute osseous abnormalities are seen. A left shoulder hemiarthroplasty is again noted. IMPRESSION: Mild bibasilar airspace opacities raise concern for pneumonia; chronic vascular congestion and small left pleural effusion noted. Mild pulmonary edema could conceivably have a similar appearance  Treatment: Per ED notes: was given nitroglycerin and Lasix in route; hydralazine  IV Medications: lasix  You may use possible, probable, or suspect with inpatient documentation. Possible, probable, suspected diagnoses MUST be documented at the time of discharge.  Reviewed: additional documentation in the medical record  Thank You,  Amada Kingfisher RN, BSN, CCM  Clinical Documentation Specialist: [phone] [email or web address]  [facility] [employer] [facility] [place]

## 2011-06-24 NOTE — Progress Notes (Signed)
CARDIAC REHAB PHASE I   PRE:  Rate/Rhythm: 63 SR with PACs    BP: sitting 134/62    SaO2: 95  RA  MODE:  Ambulation: 420 ft   POST:  Rate/Rhythm: 82 SR    BP: sitting 140/70     SaO2: 94 RA  Tolerated well. Sts only slight SOB. Able to talk entire walk. No noticeable SOB. Feels well with VSS. Reminded to weigh and walk daily. [phone]  [name] CES, ACSM

## 2011-06-24 NOTE — Progress Notes (Addendum)
78 y.o. female with a past medical history significant for paroxysmal atrial fibrillation for many years, previously on amiodarone for 11 years and had shortness of breath, so was switched to sotalol. Unfortunately she broke through this with a-fib and RVR in the office. Generally her episodes last from 48-72 hours. Dr. Clarene Duke considered starting her on Multaq, however, she was started back on amiodarone and sent for cardioversion. She was successfully cardioverted by Dr. Royann Shivers on 06/19/2011 with one shock back to sinus bradycardia. Over the past few days she has been having respiratory symptoms of cough and wheezing, along with chills, but no fevers or sweats. This evening she was asleep and awakened suddenly with marked shortness of breath. She could not lie flat and EMS was called. She presented in a-fib with RVR, required bipap, lasix and nitroglycerin. She was hypertensive at the time as well, with systolic BP >200.   Subjective: No chest pain.  Objective: Vital signs in last 24 hours: Temp:  [97.7 F (36.5 C)-98 F (36.7 C)] 97.7 F (36.5 C) (01/28 1328) Pulse Rate:  [55-66] 60  (01/28 1328) Resp:  [20] 20  (01/28 1328) BP: (110-131)/(48-67) 110/56 mmHg (01/28 1328) SpO2:  [94 %-96 %] 96 % (01/28 1328) Weight:  [65.5 kg (144 lb 6.4 oz)] 65.5 kg (144 lb 6.4 oz) (01/28 0500) Weight change: -0.6 kg (-1 lb 5.2 oz) Last BM Date: 06/23/11 Intake/Output from previous day:   Intake/Output this shift: Total I/O In: 600 [P.O.:600] Out: -   PE: General:A&O pleasant affect, NAD.  Heart:S1S2, RRR. III/VI systolic murmur with radiation to abd and back. Lungs: clear without rales, rhonchi or wheezes  RJJ:OACZ non tender +BS Ext:no edema.  Tele:SR with occ PACs   Lab Results:  Basename 06/22/11 2309 06/22/11 0900  WBC 8.2 9.3  HGB 10.5* 11.1*  HCT 32.8* 34.2*  PLT 233 237   BMET  Basename 06/24/11 0645 06/23/11 0837  NA 141 142  K 3.4* 3.5  CL 103 104  CO2 29 29  GLUCOSE 97 92   BUN 30* 29*  CREATININE 1.07 1.03  CALCIUM 9.1 8.9    Basename 06/22/11 0104 06/21/11 1640  TROPONINI <0.30 <0.30    Lab Results  Component Value Date   CHOL  Value: 140        ATP III CLASSIFICATION:  <200     mg/dL   Desirable  660-630  mg/dL   Borderline High  >=160    mg/dL   High        1/0/9323   HDL 44 12/31/2008   LDLCALC  Value: 83        Total Cholesterol/HDL:CHD Risk Coronary Heart Disease Risk Table                     Men   Women  1/2 Average Risk   3.4   3.3  Average Risk       5.0   4.4  2 X Average Risk   9.6   7.1  3 X Average Risk  23.4   11.0        Use the calculated Patient Ratio above and the CHD Risk Table to determine the patient's CHD Risk.        ATP III CLASSIFICATION (LDL):  <100     mg/dL   Optimal  557-322  mg/dL   Near or Above  Optimal  130-159  mg/dL   Borderline  161-096  mg/dL   High  >045     mg/dL   Very High 4/0/9811   TRIG 67 12/31/2008   CHOLHDL 3.2 12/31/2008   No results found for this basename: HGBA1C     Lab Results  Component Value Date   TSH 4.939* 06/21/2011    Hepatic Function Panel No results found for this basename: PROT,ALBUMIN,AST,ALT,ALKPHOS,BILITOT,BILIDIR,IBILI in the last 72 hours No results found for this basename: CHOL in the last 72 hours No results found for this basename: PROTIME in the last 72 hours    EKG:   Studies/Results: Dg Chest Port 1 View  06/23/2011  *RADIOLOGY REPORT*  Clinical Data: Acute on chronic congestive heart failure  PORTABLE CHEST - 1 VIEW  Comparison: Chest radiograph 06/22/2011  Findings: Stable enlarged cardiac silhouette.  Lungs are hyperinflated.  No effusion, infiltrate, pneumothorax.  Chronic bronchitic markings centrally. Mild left lower lobe atelectasis appears improved.  IMPRESSION:  1.  No clear acute findings. 2.  Cardiomegaly and emphysematous change. 3.  Mild left lower lobe atelectasis appears.  Original Report Authenticated By: Genevive Bi, M.D.   2D Echo:Left  ventricle: The cavity size was normal. Wall thickness was normal. Systolic function was normal. The estimated ejection fraction was in the range of 55% to 60%. There is likely restrictive diastolic dysfunction. The "a" wave is small, but seems present, and the E/A ratio is >4. The mitral DT is150-170 msec.The E/e' rato is >20, suggesting elevated LV filling pressure. - Aortic valve: Moderately calcified with likely moderate to severe stenosis. The peak and mean gradients are 30 mmHG and 16 mmHG, respectively. I have measured the LVOT size as2.0 cm (not 1.4 cm). Based on this, the calculated AVA is 1.0 cm2 by VTI. - Mitral valve: Calcified annulus. Mild regurgitation. Mean gradient: 4mm Hg (D). Peak gradient: 12mm Hg (D). - Left atrium: Moderately dilated. - Tricuspid valve: Moderate regurgitation   Medications: I have reviewed the patient's current medications.    Marland Kitchen acetaminophen  650 mg Oral TID  . amiodarone  400 mg Oral BID  . aspirin EC  81 mg Oral Daily  . atorvastatin  40 mg Oral q1800  . azithromycin  250 mg Oral Daily  . benazepril  40 mg Oral Daily  . ezetimibe  10 mg Oral Daily  . folic acid  2 mg Oral Daily  . furosemide  40 mg Oral QPC breakfast  . hydrALAZINE  50 mg Oral TID  . levothyroxine  88 mcg Oral QAC breakfast  . metoprolol  25 mg Oral BID  . mulitivitamin with minerals  1 tablet Oral Daily  . potassium chloride  40 mEq Oral Once  . warfarin  1.25 mg Oral ONCE-1800   Assessment/Plan: Patient Active Problem List  Diagnoses  . Paroxysmal atrial fibrillation  . Pulmonary edema  . Systolic and diastolic CHF, acute on chronic  . Hyperlipidemia  . HTN (hypertension)  . Hypothyroidism  . Coronary artery disease  .  Acute respiratiory failure requiring BiPap  . Aortic valvular stenosis, moderate to severe  . Accelerated hypertension - SBPs > on admission with Afib RVR -- resulting in acute diastolic HF -- Resolved.  anticoagulation   PLAN: INR  therapeutic. Hypokalemic will replace.  BNP 683-none to compare with. Ambulate, ? D/c home? Previously was on amiodarone and it was d/c'd secondary to breathlessness (several years ago).  Pt. Had been on sotalol with breakthrough afib. The first of  Jan. Was placed on amiodarone as outpatient.  Here we increased her amiodarone to 400 mg BID.  ? Decrease to 200 bid at discharge.   Will obtain old records to eval. AS.  Currently mod. To severe, ? If this is old. Wt. 65.5 down from 66.5. -1653 I&O On abx secondary to URI.  LOS: 3 days   Lyric Hoar W 06/24/2011, 4:31 PM   I have seen and examined the patient along with Nada Boozer, NP.  I have reviewed the chart, notes and new data.  I agree with Laura's note.  Key new complaints: Just feels tired, walked 2 x then came back & fell right asleep; has not had a good nite of sleep in some time Key examination changes: AS murmur noted, no rales Key new findings / data: BNP from 1/26 was ~600 - diuresed after that, may check one pre-d/c for baseline.  No SSx of active CHF.  INR therapeutic.  On Abx for presumed URI   PLAN: She is feeling much better & close to discharge. Would like to see her less worn out after ambulating. Will monitor one more day to ensure that she is adequately rested & ready for d/c home.  Minimize early AM lab check - with check labs this PM instead. Anticipate d/c tomorrow  Marykay Lex, M.D., M.S. [facility] [address] [place], [place]  [address]  [phone]  06/24/2011 4:31 PM

## 2011-06-25 LAB — BASIC METABOLIC PANEL
BUN: 28 mg/dL — ABNORMAL HIGH (ref 6–23)
Chloride: 105 mEq/L (ref 96–112)
GFR calc Af Amer: 62 mL/min — ABNORMAL LOW (ref >90)
Glucose, Bld: 96 mg/dL (ref 70–99)
Potassium: 4.1 mEq/L (ref 3.5–5.1)

## 2011-06-25 LAB — CBC
HCT: 32.7 % — ABNORMAL LOW (ref 36.0–46.0)
Hemoglobin: 10.4 g/dL — ABNORMAL LOW (ref 12.0–15.0)
MCH: 28.9 pg (ref 26.0–34.0)
MCHC: 31.8 g/dL (ref 30.0–36.0)

## 2011-06-25 MED ORDER — METOPROLOL TARTRATE 50 MG PO TABS: 25.0000 mg | ORAL_TABLET | Freq: Two times a day (BID) | ORAL | Status: DC

## 2011-06-25 MED ORDER — LOSARTAN POTASSIUM 50 MG PO TABS
100.0000 mg | ORAL_TABLET | Freq: Every day | ORAL | Status: DC
  Administered 2011-06-25: 100 mg via ORAL
  Filled 2011-06-25: qty 2

## 2011-06-25 MED ORDER — AMIODARONE HCL 200 MG PO TABS: 400.0000 mg | ORAL_TABLET | Freq: Every day | ORAL | Status: DC

## 2011-06-25 MED ORDER — LOSARTAN POTASSIUM 100 MG PO TABS: 100.0000 mg | ORAL_TABLET | Freq: Every day | ORAL | Status: DC

## 2011-06-25 MED ORDER — OFF THE BEAT BOOK
Freq: Once | Status: AC
  Administered 2011-06-25: 12:00:00
  Filled 2011-06-25: qty 1

## 2011-06-25 NOTE — Progress Notes (Signed)
ANTICOAGULATION CONSULT NOTE - Follow Up  Pharmacy Consult for Coumadin Indication: atrial fibrillation  Allergies  Allergen Reactions  . Sulfa Antibiotics Itching and Rash    Patient Measurements: Height: 5\' 3"  (160 cm) Weight: 144 lb 6.4 oz (65.5 kg) IBW/kg (Calculated) : 52.4   Vital Signs: Temp: 97.8 F (36.6 C) (01/29 0500) Temp src: Oral (01/29 0500) BP: 144/67 mmHg (01/29 0914) Pulse Rate: 56  (01/29 0915)  Labs:  Alvira Philips 06/25/11 0540 06/24/11 0645 06/23/11 0837 06/22/11 2309  HGB 10.4* -- -- 10.5*  HCT 32.7* -- -- 32.8*  PLT 236 -- -- 233  APTT -- -- -- --  LABPROT 20.7* 27.8* 33.2* --  INR 1.74* 2.54* 3.19* --  HEPARINUNFRC -- -- -- --  CREATININE 0.95 1.07 1.03 --  CKTOTAL -- -- -- --  CKMB -- -- -- --  TROPONINI -- -- -- --   Estimated Creatinine Clearance: 39.4 ml/min (by C-G formula based on Cr of 0.95).  Medical History: Past Medical History  Diagnosis Date  . CHF (congestive heart failure)   . Hypothyroidism   . Arthritis   . Dyslipidemia   . Hypertension   . Hx of hemorrhoidectomy   . PAF (paroxysmal atrial fibrillation)   . Cataract   .  Acute respiratiory failure requiring BiPap 06/24/2011  . Aortic valvular stenosis, moderate to severe 06/24/2011    Medications:  Prescriptions prior to admission  Medication Sig Dispense Refill  . acetaminophen (TYLENOL) 650 MG CR tablet Take 650 mg by mouth 3 (three) times daily.      Marland Kitchen amiodarone (PACERONE) 200 MG tablet Take 200 mg by mouth daily.      Marland Kitchen amLODipine (NORVASC) 2.5 MG tablet Take 2.5 mg by mouth daily.        Marland Kitchen aspirin EC 81 MG tablet Take 81 mg by mouth daily.      . benazepril (LOTENSIN) 40 MG tablet Take 40 mg by mouth daily.        Marland Kitchen ezetimibe (ZETIA) 10 MG tablet Take 10 mg by mouth daily.        . folic acid (FOLVITE) 1 MG tablet Take 2 mg by mouth daily.       . furosemide (LASIX) 40 MG tablet Take 40 mg by mouth daily.       . hydrALAZINE (APRESOLINE) 50 MG tablet Take 50 mg  by mouth 3 (three) times daily.        Marland Kitchen levothyroxine (SYNTHROID, LEVOTHROID) 88 MCG tablet Take 88 mcg by mouth daily.        . Multiple Vitamins-Minerals (CENTRUM SILVER) tablet Take 1 tablet by mouth daily.        Marland Kitchen warfarin (COUMADIN) 5 MG tablet Take 2.5 mg by mouth daily.      Marland Kitchen atorvastatin (LIPITOR) 40 MG tablet Take 40 mg by mouth daily.        . metoprolol (LOPRESSOR) 50 MG tablet Take 50 mg by mouth 2 (two) times daily.         Medications:  Scheduled:     . acetaminophen  650 mg Oral TID  . amiodarone  400 mg Oral BID  . aspirin EC  81 mg Oral Daily  . atorvastatin  40 mg Oral q1800  . azithromycin  250 mg Oral Daily  . ezetimibe  10 mg Oral Daily  . folic acid  2 mg Oral Daily  . furosemide  40 mg Oral QPC breakfast  . hydrALAZINE  50 mg Oral TID  .  levothyroxine  88 mcg Oral QAC breakfast  . losartan  100 mg Oral Daily  . metoprolol  25 mg Oral BID  . mulitivitamin with minerals  1 tablet Oral Daily  . potassium chloride  40 mEq Oral Once  . warfarin  1.25 mg Oral ONCE-1800  . DISCONTD: benazepril  40 mg Oral Daily      Assessment: 78 yo female with Afib for anticoagulation on Coumadin prior to admit ( 2.5mg  daily).  Last taken 06/20/11.  Noted pt recently changed antiarrhythmics back to amiodarone.  Per H&P 03/2009, Coumadin home regimen was 1.25 mg daily when previously on amiodarone.   Today INR = 1.74,  a decrease from 2.54  after Coumadin doses held due to supratherapeutic INR 1/25 thru 1/27. Coumadin resumed 1/28 with 1.25mg . Patient also on Amiodarone (dose increased to 400mg  BID here) which can increase coumadin effect.  No bleeding reported. Azithromycin stopped today.   Goal of Therapy:  INR 2-3   Plan:  Patient likely to be discharged today. Cardiologist indicates plan to dc on amiodarone 400mg  po daily.  I recommend discharge coumadin dose 1.25mg  daily with INR check by Friday 06/27/11.  Arman Filter, RPh 06/25/2011,10:40 AM

## 2011-06-25 NOTE — Discharge Summary (Signed)
Kenneth C. Hilty, MD Attending Cardiologist [facility]  

## 2011-06-25 NOTE — Discharge Summary (Signed)
Patient ID: [name],  MRN: [id number], DOB/AGE: 76-Mar-1925 76 y.o.  Admit date: 06/21/2011 Discharge date: 06/25/2011  Primary Care Provider:  Primary Cardiologist: Dr Clarene Duke  Discharge Diagnoses  Principal Problem:  * Acute respiratiory failure requiring BiPap  Active Problems:  Accelerated hypertension - SBPs > on admission with Afib RVR -- resulting in acute diastolic HF  Paroxysmal atrial fibrillation, recurrent  Systolic and diastolic CHF, acute on chronic  HTN (hypertension)  Aortic valvular stenosis, moderate to severe  Hyperlipidemia  Hypothyroidism  Coronary artery disease    Procedures: None   Hospital Course Ms. Dancy is an 78 year old female oh by Dr. little. She has a history of atrial fibrillation. She been taken off amiodarone after several years because of cough and shortness of breath. He was then placed on sotalol but failed to hold sinus rhythm. Recently her amiodarone was restarted and she was cardioverted to sinus rhythm on 06/19/2011. She's admitted 06/21/2011 with acute shortness of breath. She was in acute respiratory failure on admission and required BiPAP. She was in rapid atrial fibrillation. He was treated with diuretics. She was loaded with amiodarone. She converted spontaneously back to sinus. She improved promptly with diuresis resumption of sinus rhythm. At discharge it was decided to send her home on 4 mg of amiodarone daily, she'll followup with Dr. little as an outpatient. She did have a high INR on admission, her Coumadin was held and her INR is now down to 1.7. This will need to be checked in 48 hours. He did resume her Coumadin at discharge.  Discharge Vitals:  Blood pressure 144/67, pulse 56, temperature 97.8 F (36.6 C), temperature source Oral, resp. rate 18, height 5\' 3"  (1.6 m), weight 65.5 kg (144 lb 6.4 oz), SpO2 95.00%.    Labs: Results for orders placed during the hospital encounter of 06/21/11 (from the past 48  hour(s))  BASIC METABOLIC PANEL     Status: Abnormal   Collection Time   06/24/11  6:45 AM      Component Value Range Comment   Sodium 141  135 - 145 (mEq/L)    Potassium 3.4 (*) 3.5 - 5.1 (mEq/L)    Chloride 103  96 - 112 (mEq/L)    CO2 29  19 - 32 (mEq/L)    Glucose, Bld 97  70 - 99 (mg/dL)    BUN 30 (*) 6 - 23 (mg/dL)    Creatinine, Ser 9.14  0.50 - 1.10 (mg/dL)    Calcium 9.1  8.4 - 10.5 (mg/dL)    GFR calc non Af Amer 46 (*) >90 (mL/min)    GFR calc Af Amer 53 (*) >90 (mL/min)   PROTIME-INR     Status: Abnormal   Collection Time   06/24/11  6:45 AM      Component Value Range Comment   Prothrombin Time 27.8 (*) 11.6 - 15.2 (seconds)    INR 2.54 (*) 0.00 - 1.49    PRO B NATRIURETIC PEPTIDE     Status: Abnormal   Collection Time   06/24/11  8:03 PM      Component Value Range Comment   Pro B Natriuretic peptide (BNP) 542.9 (*) 0 - 450 (pg/mL)   PROTIME-INR     Status: Abnormal   Collection Time   06/25/11  5:40 AM      Component Value Range Comment   Prothrombin Time 20.7 (*) 11.6 - 15.2 (seconds)    INR 1.74 (*) 0.00 - 1.49    BASIC METABOLIC  PANEL     Status: Abnormal   Collection Time   06/25/11  5:40 AM      Component Value Range Comment   Sodium 141  135 - 145 (mEq/L)    Potassium 4.1  3.5 - 5.1 (mEq/L) DELTA CHECK NOTED   Chloride 105  96 - 112 (mEq/L)    CO2 27  19 - 32 (mEq/L)    Glucose, Bld 96  70 - 99 (mg/dL)    BUN 28 (*) 6 - 23 (mg/dL)    Creatinine, Ser 7.84  0.50 - 1.10 (mg/dL)    Calcium 8.9  8.4 - 10.5 (mg/dL)    GFR calc non Af Amer 53 (*) >90 (mL/min)    GFR calc Af Amer 62 (*) >90 (mL/min)   CBC     Status: Abnormal   Collection Time   06/25/11  5:40 AM      Component Value Range Comment   WBC 7.3  4.0 - 10.5 (K/uL)    RBC 3.60 (*) 3.87 - 5.11 (MIL/uL)    Hemoglobin 10.4 (*) 12.0 - 15.0 (g/dL)    HCT 69.6 (*) 29.5 - 46.0 (%)    MCV 90.8  78.0 - 100.0 (fL)    MCH 28.9  26.0 - 34.0 (pg)    MCHC 31.8  30.0 - 36.0 (g/dL)    RDW 28.4  13.2 - 44.0 (%)     Platelets 236  150 - 400 (K/uL)   MAGNESIUM     Status: Normal   Collection Time   06/25/11  5:40 AM      Component Value Range Comment   Magnesium 2.5  1.5 - 2.5 (mg/dL)     Disposition:  Follow-up Information    Follow up with LITTLE, ALFRED B, MD. (office will call)    Contact information:   [address] [place] [address] [phone]          Discharge Medications:  Medication List  As of 06/25/2011 12:03 PM   TAKE these medications         acetaminophen 650 MG CR tablet   Commonly known as: TYLENOL   Take 650 mg by mouth 3 (three) times daily.      amiodarone 200 MG tablet   Commonly known as: PACERONE   Take 2 tablets (400 mg total) by mouth daily.      amLODipine 2.5 MG tablet   Commonly known as: NORVASC   Take 2.5 mg by mouth daily.      aspirin EC 81 MG tablet   Take 81 mg by mouth daily.      atorvastatin 40 MG tablet   Commonly known as: LIPITOR   Take 40 mg by mouth daily.      benazepril 40 MG tablet   Commonly known as: LOTENSIN   Take 40 mg by mouth daily.      CENTRUM SILVER tablet   Take 1 tablet by mouth daily.      ezetimibe 10 MG tablet   Commonly known as: ZETIA   Take 10 mg by mouth daily.      folic acid 1 MG tablet   Commonly known as: FOLVITE   Take 2 mg by mouth daily.      furosemide 40 MG tablet   Commonly known as: LASIX   Take 40 mg by mouth daily.      hydrALAZINE 50 MG tablet   Commonly known as: APRESOLINE   Take 50 mg by  mouth 3 (three) times daily.      levothyroxine 88 MCG tablet   Commonly known as: SYNTHROID, LEVOTHROID   Take 88 mcg by mouth daily.      losartan 100 MG tablet   Commonly known as: COZAAR   Take 1 tablet (100 mg total) by mouth daily.      metoprolol 50 MG tablet   Commonly known as: LOPRESSOR   Take 0.5 tablets (25 mg total) by mouth 2 (two) times daily.      warfarin 5 MG tablet   Commonly known as: COUMADIN   Take 2.5 mg by mouth daily.              Outstanding Labs/Studies  Duration of Discharge Encounter: Greater than 30 minutes including physician time.  Jolene Provost PA-C 06/25/2011 12:03 PM

## 2011-06-25 NOTE — Progress Notes (Signed)
Pt. Seen and examined. Agree with the NP/PA-C note as written. Ok for discharge today. Home on amiodarone 400 mg po daily. F/u with Dr. Clarene Duke.  Chrystie Nose, MD Attending Cardiologist [facility]

## 2011-06-25 NOTE — Progress Notes (Signed)
Pt discharged to home per MD order. Pt alert and oriented with no complaints of shortness of breath. Pt and husband received all discharge instructions and medication information, including CHF education and "off the beat" book.  [name]

## 2011-06-25 NOTE — Progress Notes (Signed)
   CARE MANAGEMENT NOTE HEART FAILURE  06/25/2011   Patient:  [name], [name]   Account Number:  [id number]    Date Initiated:  06/24/2011  Documentation initiated by:  Tera Mater  Subjective/Objective Assessment:   78yo female admitted with SOB and rapid heartbeat.  HX: CHF, AFIB, HTN.  Pt. lives at home with spouse.   Action/Plan:   Discharge planning for [facility] RN for HF management.   Anticipated DC Date:  06/26/2011  Anticipated DC Plan:  HOME W HOME HEALTH SERVICES  DC Planning Services:  CM consult    Choice offered to / List presented to:          Status of service:  Completed, signed off  Medicare Important Message Given:  NA - LOS <3 / Initial given by admissions (If response is "NO", the following Medicare IM given date fields will be blank) Date Medicare IM Given:   Date Additional Medicare IM Given:    Discharge Disposition:  HOME/SELF CARE  Per UR Regulation:  Reviewed for med. necessity/level of care/duration of stay  Comments:   06/25/11 1100 Spoke with pt. about HF education. Gave pt. the Living Better with Heart Failure book.  Pt. states she weighs every night and she watches her salt intake.  She was not interested in having [facility] RN for HF management at this time.  Pt. will be discharged home this afternoon. Tera Mater, RN, BSN   06/24/11 1530 UR Completed. Tera Mater, RN, BSN   Initial CM contact:  06/25/2011 11:00 AM  By:  Tera Mater Initial CSW contact:     By:      Is this an INP Readmission < 30 days:  N (If "YES" please see readmission information at the bottom of note)  Patient living status prior to this admission:  FAMILY  Patient setting prior to this admission:  HOME  Comorbid conditions being treated that contributed to this admission:  CHF, AFIB, HTN  CHF Readmission Risk:  high  Type of patient education provided  HF Patient Education Assessment / Teach Back  HF Zone Tool / Magnet  Limit salt intake  Weigh daily       Patient education provided by  [facility]    Was referral made to [facility]:  N  Is the patient's PCP the same as attending:  N PCP:    Readmission < 30 Days If pt has HH, did they contact the agency before going to the ED:   Name of [facility] agency:    Was the follow-up physician visit scheduled prior to discharge:    Did the patient follow-up with the physician prior to this readmission:    Was there HF Clinic visits prior to readmission:    Were there ED visits between admissions:    Readmit type:    If unscheduled and related indicate reason for readmit:

## 2011-06-25 NOTE — Progress Notes (Signed)
Subjective:  No SOB this am. Up without problems.  Objective:  Vital Signs in the last 24 hours: Temp:  [97.4 F (36.3 C)-97.8 F (36.6 C)] 97.8 F (36.6 C) (01/29 0500) Pulse Rate:  [57-63] 57  (01/29 0500) Resp:  [18-20] 18  (01/29 0500) BP: (110-131)/(56-67) 117/58 mmHg (01/29 0500) SpO2:  [95 %-96 %] 95 % (01/29 0500)  Intake/Output from previous day:  Intake/Output Summary (Last 24 hours) at 06/25/11 0838 Last data filed at 06/24/11 1800  Gross per 24 hour  Intake    840 ml  Output      0 ml  Net    840 ml    Physical Exam: General appearance: alert, cooperative and no distress Neck: no adenopathy, no carotid bruit, no JVD, supple, symmetrical, trachea midline and thyroid not enlarged, symmetric, no tenderness/mass/nodules, transmitted murmur Lungs: clear to auscultation bilaterally Heart: regular rate and rhythm and 2/6 syst murmur of AS   Rate: 55  Rhythm: sinus bradycardia and first degree AVB  Lab Results:  Basename 06/25/11 0540 06/22/11 2309  WBC 7.3 8.2  HGB 10.4* 10.5*  PLT 236 233    Basename 06/25/11 0540 06/24/11 0645  NA 141 141  K 4.1 3.4*  CL 105 103  CO2 27 29  GLUCOSE 96 97  BUN 28* 30*  CREATININE 0.95 1.07   No results found for this basename: TROPONINI:2,CK,MB:2 in the last 72 hours Hepatic Function Panel No results found for this basename: PROT,ALBUMIN,AST,ALT,ALKPHOS,BILITOT,BILIDIR,IBILI in the last 72 hours No results found for this basename: CHOL in the last 72 hours  Basename 06/25/11 0540  INR 1.74*    Imaging: No results found.  Cardiac Studies:  Assessment/Plan:   Principal Problem:  * Acute respiratiory failure requiring BiPap  Active Problems:  Accelerated hypertension - SBPs > on admission with Afib RVR -- resulting in acute diastolic HF   Paroxysmal atrial fibrillation, recurrent, back in NSR SB after Amio bolus  Systolic and diastolic CHF, acute on chronic  HTN (hypertension)  Aortic valvular  stenosis, moderate to severe  Hyperlipidemia  Hypothyroidism  Coronary artery disease  Plan- change ACE to ARB with history of recurrent cough complaint. Coumadin resumed, last dose of Zithromax this AM. Home later today, will discuss Amio dose with MD.    Corine Shelter PA-C 06/25/2011, 8:38 AM

## 2011-07-30 LAB — HM MAMMOGRAPHY: HM Mammogram: NEGATIVE

## 2011-07-31 ENCOUNTER — Encounter: Payer: Self-pay | Admitting: Internal Medicine

## 2011-09-14 ENCOUNTER — Encounter (HOSPITAL_BASED_OUTPATIENT_CLINIC_OR_DEPARTMENT_OTHER): Payer: Self-pay | Admitting: *Deleted

## 2011-09-14 ENCOUNTER — Emergency Department (HOSPITAL_BASED_OUTPATIENT_CLINIC_OR_DEPARTMENT_OTHER)
Admission: EM | Admit: 2011-09-14 | Discharge: 2011-09-15 | Disposition: A | Discharge status: Home / Self Care | Payer: Medicare Other | Attending: Emergency Medicine | Admitting: Emergency Medicine

## 2011-09-14 DIAGNOSIS — Z79899 Other long term (current) drug therapy: Secondary | ICD-10-CM | POA: Insufficient documentation

## 2011-09-14 DIAGNOSIS — E079 Disorder of thyroid, unspecified: Secondary | ICD-10-CM | POA: Insufficient documentation

## 2011-09-14 DIAGNOSIS — J189 Pneumonia, unspecified organism: Secondary | ICD-10-CM | POA: Insufficient documentation

## 2011-09-14 DIAGNOSIS — I10 Essential (primary) hypertension: Secondary | ICD-10-CM | POA: Insufficient documentation

## 2011-09-14 DIAGNOSIS — Z8739 Personal history of other diseases of the musculoskeletal system and connective tissue: Secondary | ICD-10-CM | POA: Insufficient documentation

## 2011-09-14 DIAGNOSIS — I509 Heart failure, unspecified: Secondary | ICD-10-CM | POA: Insufficient documentation

## 2011-09-14 MED ORDER — ALBUTEROL SULFATE (5 MG/ML) 0.5% IN NEBU
5.0000 mg | INHALATION_SOLUTION | Freq: Once | RESPIRATORY_TRACT | Status: AC
  Administered 2011-09-14: 5 mg via RESPIRATORY_TRACT

## 2011-09-14 MED ORDER — ALBUTEROL SULFATE (5 MG/ML) 0.5% IN NEBU
INHALATION_SOLUTION | RESPIRATORY_TRACT | Status: AC
  Administered 2011-09-14: 5 mg via RESPIRATORY_TRACT
  Filled 2011-09-14: qty 1

## 2011-09-14 NOTE — ED Notes (Signed)
Pt states she was seen at the Dr. Burgess Estelle and they told her she was a little congested and had her get some Mucinex and Allegra, but these have not helped. Pt feels worse. Unable to cough anything up. BBS-diminished. Hx afib "with shock" and CHF.

## 2011-09-15 ENCOUNTER — Emergency Department (INDEPENDENT_AMBULATORY_CARE_PROVIDER_SITE_OTHER): Payer: Medicare Other

## 2011-09-15 DIAGNOSIS — R062 Wheezing: Secondary | ICD-10-CM

## 2011-09-15 DIAGNOSIS — R05 Cough: Secondary | ICD-10-CM

## 2011-09-15 DIAGNOSIS — R918 Other nonspecific abnormal finding of lung field: Secondary | ICD-10-CM

## 2011-09-15 DIAGNOSIS — J8410 Pulmonary fibrosis, unspecified: Secondary | ICD-10-CM

## 2011-09-15 LAB — BASIC METABOLIC PANEL
BUN: 16 mg/dL (ref 6–23)
CO2: 24 mEq/L (ref 19–32)
Calcium: 9.3 mg/dL (ref 8.4–10.5)
Chloride: 106 mEq/L (ref 96–112)
Creatinine, Ser: 1 mg/dL (ref 0.50–1.10)
GFR calc Af Amer: 58 mL/min — ABNORMAL LOW (ref >90)
GFR calc non Af Amer: 50 mL/min — ABNORMAL LOW (ref >90)
Glucose, Bld: 118 mg/dL — ABNORMAL HIGH (ref 70–99)
Potassium: 4 mEq/L (ref 3.5–5.1)
Sodium: 142 mEq/L (ref 135–145)

## 2011-09-15 LAB — DIFFERENTIAL
Basophils Absolute: 0 10*3/uL (ref 0.0–0.1)
Basophils Relative: 0 % (ref 0–1)
Eosinophils Absolute: 0 10*3/uL (ref 0.0–0.7)
Eosinophils Relative: 0 % (ref 0–5)
Lymphocytes Relative: 10 % — ABNORMAL LOW (ref 12–46)
Lymphs Abs: 1 10*3/uL (ref 0.7–4.0)
Monocytes Absolute: 0.9 10*3/uL (ref 0.1–1.0)
Monocytes Relative: 8 % (ref 3–12)
Neutro Abs: 8.4 10*3/uL — ABNORMAL HIGH (ref 1.7–7.7)
Neutrophils Relative %: 82 % — ABNORMAL HIGH (ref 43–77)

## 2011-09-15 LAB — TROPONIN I: Troponin I: <0.3 ng/mL (ref <0.30)

## 2011-09-15 LAB — CBC
HCT: 36.8 % (ref 36.0–46.0)
Hemoglobin: 12.1 g/dL (ref 12.0–15.0)
MCH: 30 pg (ref 26.0–34.0)
MCHC: 32.9 g/dL (ref 30.0–36.0)
MCV: 91.3 fL (ref 78.0–100.0)
Platelets: 262 10*3/uL (ref 150–400)
RBC: 4.03 MIL/uL (ref 3.87–5.11)
RDW: 14.7 % (ref 11.5–15.5)
WBC: 10.3 10*3/uL (ref 4.0–10.5)

## 2011-09-15 MED ORDER — DOXYCYCLINE HYCLATE 100 MG PO CAPS: 100.0000 mg | ORAL_CAPSULE | Freq: Two times a day (BID) | ORAL | Status: AC

## 2011-09-15 MED ORDER — ALBUTEROL SULFATE HFA 108 (90 BASE) MCG/ACT IN AERS: 1.0000 | INHALATION_SPRAY | Freq: Four times a day (QID) | RESPIRATORY_TRACT | Status: DC | PRN

## 2011-09-15 MED ORDER — MOXIFLOXACIN HCL 400 MG PO TABS
400.0000 mg | ORAL_TABLET | Freq: Once | ORAL | Status: AC
  Administered 2011-09-15: 400 mg via ORAL
  Filled 2011-09-15: qty 1

## 2011-09-15 MED ORDER — DOXYCYCLINE HYCLATE 100 MG PO CAPS: 100.0000 mg | ORAL_CAPSULE | Freq: Two times a day (BID) | ORAL | Status: DC

## 2011-09-15 MED ORDER — BENZONATATE 100 MG PO CAPS: 100.0000 mg | ORAL_CAPSULE | Freq: Two times a day (BID) | ORAL | Status: AC | PRN

## 2011-09-15 MED ORDER — BENZONATATE 100 MG PO CAPS: 100.0000 mg | ORAL_CAPSULE | Freq: Two times a day (BID) | ORAL | Status: DC | PRN

## 2011-09-15 NOTE — ED Notes (Signed)
rx x 3 given for albuterol, tessalon and doxycycline- d/c home with spouse

## 2011-09-15 NOTE — Discharge Instructions (Signed)

## 2011-09-15 NOTE — ED Provider Notes (Addendum)
History   This chart was scribed for Felisa Bonier, MD by Charolett Bumpers . The patient was seen in room MH12/MH12 and the patient's care was started at 12:27pm.    CSN: [id number]  Arrival date & time 09/14/11  2218   First MD Initiated Contact with Patient 09/14/11 2340      Chief Complaint  Patient presents with  . Cough    (Consider location/radiation/quality/duration/timing/severity/associated sxs/prior treatment) HPI [name] is a 78 y.o. female who presents to the Emergency Department complaining of persistent, moderate cough with associated wheezing, watery eyes, and rhinorrhea for the past 3 days. Patient denies fever. Patient states that she was seen by her PCP yesterday and diagnosed with allergies and given Mucinex and Allegra. Patient states minimal relief with these medications. Patient received a breathing treatment here in the ED for wheezing and low oxygen saturation on arrival (no history of pulmonary disease or home oxygen use) and states that her symptoms have improved. Patient denies any h/o  of COPD, asthma, and diabetes. Patient states that she has a h/o A-fib, CHF, and HTN. She denies fever, chills, chest pain, palpitations, or productive cough. She denies syncope or near syncope, lightheadedness or weakness, or lower extremity edema, or orthopnea. She states that her primary concern was to make your that she is not having acute congestive heart failure as she has had in the past. Evaluation of the patient after her albuterol breathing treatment reveals normal oxygen saturations of 95% or higher with normal respiratory rate and effort, no respiratory distress, clear lung sounds and good air exchange without any wheezes.      Past Medical History  Diagnosis Date  . CHF (congestive heart failure)   . Hypothyroidism   . Arthritis   . Dyslipidemia   . Hypertension   . Hx of hemorrhoidectomy   . PAF (paroxysmal atrial fibrillation)   . Cataract     .  Acute respiratiory failure requiring BiPap 06/24/2011  . Aortic valvular stenosis, moderate to severe 06/24/2011  . Atrial fibrillation     Past Surgical History  Procedure Date  . Eye surgery     CATARACT  . Cardioversion 06/19/2011    Procedure: CARDIOVERSION;  Surgeon: Thurmon Fair, MD;  Location: [facility] OR;  Service: Cardiovascular;  Laterality: N/A;    History reviewed. No pertinent family history.  History  Substance Use Topics  . Smoking status: Never Smoker   . Smokeless tobacco: Not on file  . Alcohol Use: No    OB History    Grav Para Term Preterm Abortions TAB SAB Ect Mult Living                  Review of Systems A complete 10 system review of systems was obtained and all systems are negative except as noted in the HPI and PMH.   Allergies  Sulfa antibiotics  Home Medications   Current Outpatient Rx  Name Route Sig Dispense Refill  . ACETAMINOPHEN ER 650 MG PO TBCR Oral Take 650 mg by mouth 3 (three) times daily.    . AMIODARONE HCL 200 MG PO TABS Oral Take 300 mg by mouth daily.    Marland Kitchen AMLODIPINE BESYLATE 5 MG PO TABS Oral Take 5 mg by mouth daily.    . ASPIRIN EC 81 MG PO TBEC Oral Take 81 mg by mouth daily.    . ATORVASTATIN CALCIUM 40 MG PO TABS Oral Take 40 mg by mouth daily.     Marland Kitchen  BENAZEPRIL HCL 40 MG PO TABS Oral Take 40 mg by mouth daily.     Marland Kitchen EZETIMIBE 10 MG PO TABS Oral Take 10 mg by mouth daily.      Marland Kitchen FEXOFENADINE HCL 60 MG PO TABS Oral Take 60 mg by mouth daily as needed. For allergies    . FOLIC ACID 1 MG PO TABS Oral Take 2 mg by mouth daily.     . FUROSEMIDE 40 MG PO TABS Oral Take 40 mg by mouth daily.     . GUAIFENESIN ER 600 MG PO TB12 Oral Take 600 mg by mouth 2 (two) times daily as needed. For congestion    . HYDRALAZINE HCL 50 MG PO TABS Oral Take 50 mg by mouth 3 (three) times daily.     Marland Kitchen LEVOTHYROXINE SODIUM 88 MCG PO TABS Oral Take 88 mcg by mouth daily.      Marland Kitchen LOSARTAN POTASSIUM 100 MG PO TABS Oral Take 1 tablet (100 mg total)  by mouth daily. 30 tablet 5  . METOPROLOL TARTRATE 50 MG PO TABS Oral Take 25 mg by mouth 2 (two) times daily.    . CENTRUM SILVER PO TABS Oral Take 1 tablet by mouth daily.      . WARFARIN SODIUM 5 MG PO TABS Oral Take 1.25-2.5 mg by mouth daily. Takes 2.5 mg (1/2 tab) on Sun tues, wed ,thurs and sat  And1.25 mg ( 1/4 tab) on Mon and friday      BP 171/72  Pulse 72  Temp(Src) 97.6 F (36.4 C) (Oral)  Resp 20  Ht 5\' 3"  (1.6 m)  Wt 140 lb (63.504 kg)  BMI 24.80 kg/m2  SpO2 94%  Physical Exam  Nursing note and vitals reviewed. Constitutional: She is oriented to person, place, and time. She appears well-developed and well-nourished. No distress.  HENT:  Head: Normocephalic and atraumatic.  Right Ear: Tympanic membrane, external ear and ear canal normal.  Left Ear: External ear and ear canal normal. Tympanic membrane is perforated (not acute).  Nose: Nose normal.  Mouth/Throat: Oropharynx is clear and moist. No oropharyngeal exudate.       Post nasal drip noted. No erythema or edema noted.   Eyes: Conjunctivae and EOM are normal. Pupils are equal, round, and reactive to light.  Neck: Normal range of motion. Neck supple. No JVD present. No tracheal deviation present. No thyromegaly present.  Cardiovascular: Normal rate, regular rhythm, normal heart sounds and intact distal pulses.  Exam reveals no gallop and no friction rub.   No murmur heard. Pulmonary/Chest: Effort normal and breath sounds normal. No accessory muscle usage or stridor. Not tachypneic. No respiratory distress. She has no decreased breath sounds. She has no wheezes. She has no rhonchi. She has no rales. She exhibits no tenderness.       Good air exchange. No rhonchi.   Abdominal: Soft. Bowel sounds are normal. She exhibits no distension. There is no tenderness. There is no rebound and no guarding.  Musculoskeletal: Normal range of motion. She exhibits no edema and no tenderness.       No significant lower extremity edema  or tenderness.   Lymphadenopathy:    She has no cervical adenopathy.  Neurological: She is alert and oriented to person, place, and time. She has normal reflexes. No cranial nerve deficit or sensory deficit. She exhibits normal muscle tone. Coordination normal.  Skin: Skin is warm and dry. No rash noted. She is not diaphoretic. No erythema. No pallor.  Psychiatric: She  has a normal mood and affect. Her behavior is normal. Judgment and thought content normal.    ED Course  Procedures (including critical care time)   Date: 09/15/2011  Rate: 62  Rhythm: normal sinus rhythm  QRS Axis: normal  Intervals: PR prolonged and QT prolonged  ST/T Wave abnormalities: normal  Conduction Disutrbances:first-degree A-V block   Narrative Interpretation:   Old EKG Reviewed: none available    DIAGNOSTIC STUDIES: Oxygen Saturation is 93% on room air, adequate by my interpretation.    COORDINATION OF CARE:  0029: Discussed planned course of treatment with the patient, who is agreeable at this time.    Labs Reviewed - No data to display Dg Chest 2 View  09/15/2011  *RADIOLOGY REPORT*  Clinical Data: Worsening nonproductive cough.  Wheezing.  Decreased bilateral breath sounds.  CHEST - 2 VIEW  Comparison: 06/23/2011  Findings: Cardiac enlargement with normal pulmonary vascularity, similar to previous study. Emphysematous changes with scattered fibrosis in the lungs.  Bilateral apical pleural thickening appears stable.  Small bilateral pleural effusions, greater on the left. Infiltration or atelectasis in both lung bases.  No pneumothorax. Old right rib fractures.  Postoperative change in the left shoulder.  IMPRESSION: Blunting of the costophrenic angles suggesting pleural effusions, greater on the left.  Atelectasis or infiltration in both lung bases.  Emphysematous changes and scattered fibrosis.  Bilateral apical pleural thickening.  Original Report Authenticated By: Marlon Pel, M.D.   I have  reviewed the aforementioned images as well as interpretation. I perceive that it is possible the patient may have a mild community-acquired pneumonia, but would not require hospitalization at this time as her oxygenation and respiratory effort or normal, not requiring supplemental oxygen, and without fever or other symptoms. I will treat her for community-acquired pneumonia with an antibiotic as well as I inhaled beta agonist for relief of cough and any wheezing should recur. I've directed her to followup with her primary care physician this week for reevaluation, return to the emergency department sooner if symptoms worsen. The patient states her understanding of and agreement with the plan of care.  However, due to QT prolongation , first degree AVB on ECG, I will evaluate a cbc and electrolytes as well as a single troponin prior to discharge home if normal/stable. No diagnosis found.    MDM  Considerations for the patient include bronchitis, do to viral infection or bacterial infection, community-acquired pneumonia, arrhythmia (atrial fibrillation), COPD with acute exacerbation, new diagnosis. Other consideration would include congestive heart failure with pulmonary edema. My impression based on her physical examination is likely acute bronchitis with exacerbation of underlying mild COPD and bronchospasm. I do not perceive her to have had a myocardial infarction based on history and physical examination as well as the ECG. She is in a normal sinus rhythm not atrial fibrillation. I do not perceive her to have acute congestive heart failure based on physical examination.   I personally performed the services described in this documentation, which was scribed in my presence. The recorded information has been reviewed and considered.       Felisa Bonier, MD 09/15/11 0126  A note is made that doxycycline was chosen to treat community-acquired pneumonia as it is the only agent that does not  cause QT prolongation, as the patient artery has QT prolongation on her ECG, her electrolytes are normal, and this appears to be due to the multiple antidysrhythmic agents that she is taking. Normally, I would have liked to use  Avelox for its coverage, however I do not want to risk serious cardiac arrhythmia and the patient. If the patient's primary care physician finds this antibiotic selection unsuitable or the patient is not experiencing improvement, it can be changed to another agent considering the elevated risk of arrhythmia with other agents for community-acquired pneumonia.  Felisa Bonier, MD 09/15/11 [phone]

## 2011-09-19 ENCOUNTER — Ambulatory Visit (INDEPENDENT_AMBULATORY_CARE_PROVIDER_SITE_OTHER): Payer: 59 | Admitting: Family Medicine

## 2011-09-19 VITALS — Wt 144.0 lb

## 2011-09-19 DIAGNOSIS — H6120 Impacted cerumen, unspecified ear: Secondary | ICD-10-CM

## 2011-09-19 DIAGNOSIS — J189 Pneumonia, unspecified organism: Secondary | ICD-10-CM

## 2011-09-19 DIAGNOSIS — H918X9 Other specified hearing loss, unspecified ear: Secondary | ICD-10-CM

## 2011-09-19 NOTE — Progress Notes (Signed)
  Subjective:    Patient ID: [name], female    DOB: [birth year], 78 y.o.   MRN: [id number]  HPI She is here for followup after recent visit to the emergency room where she was found to have pneumonia. She was given Avelox one dose in the emergency room and followed up with doxycycline 100 mg twice a day. At the present time she is mainly complaining of some postnasal drainage but no cough, congestion, shortness of breath, fever or chills. She does have some right ear pain and fatigue.  Review of Systems     Objective:   Physical Exam alert and in no distress. The right tympanic membrane is clear after cerumen was removed from the right canal , left tympanic membrane has an old ration posteriorly. Throat is clear. Tonsils are normal. Neck is supple without adenopathy or thyromegaly. Cardiac exam shows an irregular sinus rhythm with 2/6 SEM. Lungs are clear to auscultation.        Assessment & Plan:   1. Pneumonia   2. Hearing loss secondary to cerumen impaction   3   left TM perforation (old). 4    A Fib Continue on the antibiotic until his colon. Return here as needed

## 2011-10-08 ENCOUNTER — Telehealth: Payer: Self-pay | Admitting: Internal Medicine

## 2011-10-08 ENCOUNTER — Ambulatory Visit (INDEPENDENT_AMBULATORY_CARE_PROVIDER_SITE_OTHER): Payer: Medicare Other | Admitting: Family Medicine

## 2011-10-08 ENCOUNTER — Encounter: Payer: Self-pay | Admitting: Family Medicine

## 2011-10-08 VITALS — BP 124/70 | HR 55 | Temp 97.9°F | Wt 145.0 lb

## 2011-10-08 DIAGNOSIS — J90 Pleural effusion, not elsewhere classified: Secondary | ICD-10-CM

## 2011-10-08 DIAGNOSIS — Z8701 Personal history of pneumonia (recurrent): Secondary | ICD-10-CM

## 2011-10-08 NOTE — Patient Instructions (Signed)
Stop the Allegra and switch to Claritin and see what that'll do to help with clearing your throat. We will set you up to see the lung specialists as well. If your breathing gets worse before you see the lung Dr. give me a call

## 2011-10-08 NOTE — Progress Notes (Signed)
  Subjective:    Patient ID: [name], female    DOB: 05/31/1923, 78 y.o.   MRN: [id number]  HPI She is here for recheck. She was seen recently in the hospital and treated for pneumonia. She was given doxycycline. She is having no fever, chills, congestion but does complain of slight cough as well as PND. She states that when she lies down she has to clear her throat but did not truly describe shortness of breath. She continues on other medications listed in the chart. She states that she had been placed on the Allegra prior to going to the hospital for the diagnosis of pneumonia.  Review of Systems     Objective:   Physical Exam Alert and in no distress. Throat is clear. Neck is supple without adenopathy. Cardiac exam does show a systolic murmur (old). Lungs show decreased breath sounds in the lower left base. Chest x-ray does show a left pleural effusion. The emergency room record was reviewed      Assessment & Plan:   1. Pleural effusion, left  Ambulatory referral to Pulmonology, CHEST X-RAY 2 VIEWS ([id number])  2. History of pneumonia  Ambulatory referral to Pulmonology, CHEST X-RAY 2 VIEWS ([id number])   I will have her switch to Claritin to see if this will help with the postnasal drainage. Referral made to evaluate the pleural effusion.

## 2011-10-08 NOTE — Telephone Encounter (Signed)
I spoke with Cordelia Pen and RB had a consult slot opening in Friday 10/11/11 at 2:15. She stated this was fine. i advised her pt needs to arrive at 2:00 to fill out paperwork and to bring medications. Nothing further was needed

## 2011-10-10 ENCOUNTER — Ambulatory Visit (INDEPENDENT_AMBULATORY_CARE_PROVIDER_SITE_OTHER): Payer: Medicare Other | Admitting: Family Medicine

## 2011-10-10 ENCOUNTER — Ambulatory Visit (HOSPITAL_COMMUNITY)
Admission: RE | Admit: 2011-10-10 | Discharge: 2011-10-10 | Disposition: A | Discharge status: Home / Self Care | Payer: Medicare Other | Source: Ambulatory Visit | Attending: Family Medicine | Admitting: Family Medicine

## 2011-10-10 VITALS — BP 114/70 | HR 59 | Temp 97.9°F

## 2011-10-10 DIAGNOSIS — R0602 Shortness of breath: Secondary | ICD-10-CM | POA: Insufficient documentation

## 2011-10-10 DIAGNOSIS — J90 Pleural effusion, not elsewhere classified: Secondary | ICD-10-CM | POA: Insufficient documentation

## 2011-10-10 DIAGNOSIS — J449 Chronic obstructive pulmonary disease, unspecified: Secondary | ICD-10-CM | POA: Insufficient documentation

## 2011-10-10 DIAGNOSIS — J9819 Other pulmonary collapse: Secondary | ICD-10-CM | POA: Insufficient documentation

## 2011-10-10 DIAGNOSIS — R05 Cough: Secondary | ICD-10-CM | POA: Insufficient documentation

## 2011-10-10 DIAGNOSIS — J4489 Other specified chronic obstructive pulmonary disease: Secondary | ICD-10-CM | POA: Insufficient documentation

## 2011-10-10 DIAGNOSIS — R059 Cough, unspecified: Secondary | ICD-10-CM | POA: Insufficient documentation

## 2011-10-10 MED ORDER — ALBUTEROL SULFATE HFA 108 (90 BASE) MCG/ACT IN AERS: 2.0000 | INHALATION_SPRAY | Freq: Four times a day (QID) | RESPIRATORY_TRACT | Status: DC | PRN

## 2011-10-11 ENCOUNTER — Encounter: Payer: Self-pay | Admitting: Emergency Medicine

## 2011-10-11 ENCOUNTER — Ambulatory Visit (INDEPENDENT_AMBULATORY_CARE_PROVIDER_SITE_OTHER): Payer: Medicare Other | Admitting: Emergency Medicine

## 2011-10-11 VITALS — BP 122/56 | HR 58 | Temp 97.7°F | Ht 63.0 in | Wt 142.0 lb

## 2011-10-11 DIAGNOSIS — R05 Cough: Secondary | ICD-10-CM | POA: Insufficient documentation

## 2011-10-11 DIAGNOSIS — J90 Pleural effusion, not elsewhere classified: Secondary | ICD-10-CM | POA: Insufficient documentation

## 2011-10-11 DIAGNOSIS — R059 Cough, unspecified: Secondary | ICD-10-CM

## 2011-10-11 MED ORDER — BENZONATATE 100 MG PO CAPS: 100.0000 mg | ORAL_CAPSULE | ORAL | Status: AC | PRN

## 2011-10-11 MED ORDER — FLUTICASONE PROPIONATE 50 MCG/ACT NA SUSP: NASAL | Status: DC

## 2011-10-11 NOTE — Assessment & Plan Note (Signed)
Smaller on most recent flim that 09/15/11, almost certainly chronic and related to her AS, HTN, CHF. I believe this is a separate issue (not related to the cough). Need to follow, especially if she develops dyspnea, may effect what we do with her diuretics.

## 2011-10-11 NOTE — Progress Notes (Signed)
  Subjective:    Patient ID: [name], female    DOB: July 13, 1923, 78 y.o.   MRN: [id number]  HPI She is here for a recheck. She states that she is having more difficulty with coughing, wheezing and some shortness of breath but no fever, chills. She does have an appointment tomorrow for pulmonary evaluation due to the effusion.   Review of Systems     Objective:   Physical Exam Alert and pale. Lungs show no wheezing but decreased breath sounds on the left. Cardiac exam shows regular rhythm with a 1/6 SEM. She was sent for chest x-ray. The x-ray results showed actual improvement in her effusion and aeration. I did review the x-ray with the radiologist.      Assessment & Plan:   1. Pleural effusion on left  DG Chest 2 View, albuterol (PROVENTIL HFA;VENTOLIN HFA) 108 (90 BASE) MCG/ACT inhaler   she will keep her portable pulmonary tomorrow. No antibiotic was given

## 2011-10-11 NOTE — Patient Instructions (Signed)
Agree with stopping benazepril Use loratadine (Claritin) daily Start fluticasone nasal spray, 2 sprays each nostril twice a day for the next two weeks Review the Cyclical coughing paperwork Use Tessilon Pearls, 1 every 4 hours if needed to suppress cough Your chest XRay shows some chronic fluid around the left lung that is probably related to your chronic heart disease (Blood pressure, heart valve, heart rhythm). This is already better compared with 09/15/11.  Follow with Dr Delton Coombes if needed

## 2011-10-11 NOTE — Assessment & Plan Note (Signed)
Sounds like classic UA syndrome caused by increased PND/allergies. Was mistaken for PNA probably due to her pleural effusion on CXR - did not improve with abx.  - agree with d/c benazepril and starting loratadine -trial nasal steroid - will counsel her on strategies to avoid throat clearing, voice rest etc

## 2011-10-11 NOTE — Progress Notes (Signed)
Subjective:    Patient ID: [name], female    DOB: 12-27-1923, 78 y.o.   MRN: [id number]  HPI 78 yo woman, hx HTN, mod-to-severe AS, hypothyroidism. She went to ED 4/20 with cough, nasal gtt and drainage, wheeze, bothering. She was told she had PNA, treated with abx - no better. She saw Dr Susann Givens yesterday, stopped her benazepril and asked her to increase albuterol. She was cardioverted in 05/2011, has ben in NSR ever since. Amio started at that time.    Review of Systems  Constitutional: Negative for fever and unexpected weight change.  HENT: Positive for nosebleeds, congestion, rhinorrhea, sneezing and postnasal drip. Negative for ear pain, sore throat, trouble swallowing, dental problem and sinus pressure.   Eyes: Positive for redness and itching.  Respiratory: Positive for cough, shortness of breath and wheezing. Negative for chest tightness.   Cardiovascular: Positive for leg swelling. Negative for palpitations.  Gastrointestinal: Negative for nausea and vomiting.  Genitourinary: Negative for dysuria.  Musculoskeletal: Negative for joint swelling.  Skin: Negative for rash.  Neurological: Negative for headaches.  Hematological: Does not bruise/bleed easily.  Psychiatric/Behavioral: Negative for dysphoric mood. The patient is nervous/anxious.    Past Medical History  Diagnosis Date  . CHF (congestive heart failure)   . Hypothyroidism   . Arthritis   . Dyslipidemia   . Hypertension   . Hx of hemorrhoidectomy   . PAF (paroxysmal atrial fibrillation)   . Cataract   .  Acute respiratiory failure requiring BiPap 06/24/2011  . Aortic valvular stenosis, moderate to severe 06/24/2011  . Atrial fibrillation      Family History  Problem Relation Age of Onset  . Heart disease Brother   . Heart disease Brother   . Heart disease Mother   . Heart disease Father   . Cancer Mother     uterus     History   Social History  . Marital Status: Married    Spouse Name: N/A   Number of Children: 1  . Years of Education: N/A   Occupational History  . Retired [employer]   Social History Main Topics  . Smoking status: Never Smoker   . Smokeless tobacco: Never Used  . Alcohol Use: No  . Drug Use: No  . Sexually Active: Not on file   Other Topics Concern  . Not on file   Social History Narrative  . No narrative on file     Allergies  Allergen Reactions  . Sulfa Antibiotics Itching and Rash     Outpatient Prescriptions Prior to Visit  Medication Sig Dispense Refill  . acetaminophen (TYLENOL) 650 MG CR tablet Take 650 mg by mouth 3 (three) times daily.      Marland Kitchen albuterol (PROVENTIL HFA;VENTOLIN HFA) 108 (90 BASE) MCG/ACT inhaler Inhale 2 puffs into the lungs every 6 (six) hours as needed for wheezing.  1 Inhaler  0  . amiodarone (PACERONE) 200 MG tablet Take 200 mg by mouth daily.       Marland Kitchen amLODipine (NORVASC) 5 MG tablet Take 5 mg by mouth daily.      Marland Kitchen aspirin EC 81 MG tablet Take 81 mg by mouth daily.      Marland Kitchen atorvastatin (LIPITOR) 40 MG tablet Take 40 mg by mouth daily.       Marland Kitchen ezetimibe (ZETIA) 10 MG tablet Take 10 mg by mouth daily.        . folic acid (FOLVITE) 1 MG tablet Take 2 mg by mouth daily.       Marland Kitchen  furosemide (LASIX) 40 MG tablet Take 40 mg by mouth daily.       . hydrALAZINE (APRESOLINE) 50 MG tablet Take 50 mg by mouth 3 (three) times daily.       Marland Kitchen levothyroxine (SYNTHROID, LEVOTHROID) 88 MCG tablet Take 88 mcg by mouth daily.        Marland Kitchen losartan (COZAAR) 100 MG tablet Take 1 tablet (100 mg total) by mouth daily.  30 tablet  5  . metoprolol (LOPRESSOR) 50 MG tablet Take 25 mg by mouth 2 (two) times daily.      . Multiple Vitamins-Minerals (CENTRUM SILVER) tablet Take 1 tablet by mouth daily.        Marland Kitchen warfarin (COUMADIN) 5 MG tablet Take 1.25-2.5 mg by mouth daily. Takes 2.5 mg (1/2 tab) on Sun tues, wed ,thurs and sat  And1.25 mg ( 1/4 tab) on Mon and friday      . benazepril (LOTENSIN) 40 MG tablet Take 40 mg by mouth daily.                Objective:   Physical Exam  Gen: Pleasant, well-nourished, in no distress,  normal affect  ENT: No lesions,  mouth clear,  oropharynx clear, no postnasal drip  Neck: No JVD, no TMG, no carotid bruits  Lungs: No use of accessory muscles, no dullness to percussion, clear without rales or rhonchi  Cardiovascular: RRR, heart sounds normal, no murmur or gallops, 1+ ankle edema  Musculoskeletal: No deformities, no cyanosis or clubbing  Neuro: alert, non focal  Skin: Warm, no lesions or rashes     Assessment & Plan:  Chronic cough Sounds like classic UA syndrome caused by increased PND/allergies. Was mistaken for PNA probably due to her pleural effusion on CXR - did not improve with abx.  - agree with d/c benazepril and starting loratadine -trial nasal steroid - will counsel her on strategies to avoid throat clearing, voice rest etc  Pleural effusion, left Smaller on most recent flim that 09/15/11, almost certainly chronic and related to her AS, HTN, CHF. I believe this is a separate issue (not related to the cough). Need to follow, especially if she develops dyspnea, may effect what we do with her diuretics.

## 2011-10-18 ENCOUNTER — Institutional Professional Consult (permissible substitution): Payer: 59 | Admitting: Internal Medicine

## 2012-01-05 ENCOUNTER — Encounter (HOSPITAL_BASED_OUTPATIENT_CLINIC_OR_DEPARTMENT_OTHER): Payer: Self-pay | Admitting: *Deleted

## 2012-01-05 ENCOUNTER — Emergency Department (HOSPITAL_BASED_OUTPATIENT_CLINIC_OR_DEPARTMENT_OTHER): Payer: Medicare Other

## 2012-01-05 ENCOUNTER — Emergency Department (HOSPITAL_BASED_OUTPATIENT_CLINIC_OR_DEPARTMENT_OTHER)
Admission: EM | Admit: 2012-01-05 | Discharge: 2012-01-05 | Disposition: A | Discharge status: Home / Self Care | Payer: Medicare Other | Attending: Emergency Medicine | Admitting: Emergency Medicine

## 2012-01-05 DIAGNOSIS — I4891 Unspecified atrial fibrillation: Secondary | ICD-10-CM | POA: Insufficient documentation

## 2012-01-05 DIAGNOSIS — E039 Hypothyroidism, unspecified: Secondary | ICD-10-CM | POA: Insufficient documentation

## 2012-01-05 DIAGNOSIS — E785 Hyperlipidemia, unspecified: Secondary | ICD-10-CM | POA: Insufficient documentation

## 2012-01-05 DIAGNOSIS — Z79899 Other long term (current) drug therapy: Secondary | ICD-10-CM | POA: Insufficient documentation

## 2012-01-05 DIAGNOSIS — R0602 Shortness of breath: Secondary | ICD-10-CM | POA: Insufficient documentation

## 2012-01-05 DIAGNOSIS — I10 Essential (primary) hypertension: Secondary | ICD-10-CM | POA: Insufficient documentation

## 2012-01-05 DIAGNOSIS — I509 Heart failure, unspecified: Secondary | ICD-10-CM | POA: Insufficient documentation

## 2012-01-05 DIAGNOSIS — R0600 Dyspnea, unspecified: Secondary | ICD-10-CM

## 2012-01-05 LAB — CBC WITH DIFFERENTIAL/PLATELET
Basophils Relative: 0 % (ref 0–1)
HCT: 35.4 % — ABNORMAL LOW (ref 36.0–46.0)
Hemoglobin: 11.5 g/dL — ABNORMAL LOW (ref 12.0–15.0)
Lymphocytes Relative: 7 % — ABNORMAL LOW (ref 12–46)
Lymphs Abs: 0.9 10*3/uL (ref 0.7–4.0)
MCHC: 32.5 g/dL (ref 30.0–36.0)
Monocytes Absolute: 0.9 10*3/uL (ref 0.1–1.0)
Monocytes Relative: 7 % (ref 3–12)
Neutro Abs: 10.2 10*3/uL — ABNORMAL HIGH (ref 1.7–7.7)
Neutrophils Relative %: 85 % — ABNORMAL HIGH (ref 43–77)
RBC: 3.86 MIL/uL — ABNORMAL LOW (ref 3.87–5.11)

## 2012-01-05 LAB — COMPREHENSIVE METABOLIC PANEL
ALT: 25 U/L (ref 0–35)
Calcium: 9.2 mg/dL (ref 8.4–10.5)
Creatinine, Ser: 1.3 mg/dL — ABNORMAL HIGH (ref 0.50–1.10)
GFR calc Af Amer: 42 mL/min — ABNORMAL LOW (ref >90)
GFR calc non Af Amer: 36 mL/min — ABNORMAL LOW (ref >90)
Glucose, Bld: 130 mg/dL — ABNORMAL HIGH (ref 70–99)
Sodium: 141 mEq/L (ref 135–145)
Total Protein: 7.6 g/dL (ref 6.0–8.3)

## 2012-01-05 LAB — POCT I-STAT 3, VENOUS BLOOD GAS (G3P V)
Acid-base deficit: 1 mmol/L (ref 0.0–2.0)
Bicarbonate: 24.7 mEq/L — ABNORMAL HIGH (ref 20.0–24.0)
Patient temperature: 97.6
TCO2: 26 mmol/L (ref 0–100)
pH, Ven: 7.383 — ABNORMAL HIGH (ref 7.250–7.300)

## 2012-01-05 LAB — PROTIME-INR: INR: 2.18 — ABNORMAL HIGH (ref 0.00–1.49)

## 2012-01-05 MED ORDER — FUROSEMIDE 10 MG/ML IJ SOLN
40.0000 mg | Freq: Once | INTRAMUSCULAR | Status: AC
  Administered 2012-01-05: 40 mg via INTRAVENOUS
  Filled 2012-01-05: qty 4

## 2012-01-05 MED ORDER — LEVALBUTEROL HCL 0.63 MG/3ML IN NEBU
0.6300 mg | INHALATION_SOLUTION | Freq: Once | RESPIRATORY_TRACT | Status: AC
  Administered 2012-01-05: 0.63 mg via RESPIRATORY_TRACT
  Filled 2012-01-05: qty 3

## 2012-01-05 NOTE — ED Notes (Signed)
I got ecg after vitals and gave copy to Dr. Read Drivers, I then placed patient on wall monitor.

## 2012-01-05 NOTE — ED Notes (Signed)
Patient transported to radiology

## 2012-01-05 NOTE — ED Notes (Signed)
Pt presents to ED today with [facility] that has been ongoing since May.  Pt was seen by Dalton for same and medications were adjusted.  Pt states "hard to lay down"

## 2012-01-05 NOTE — ED Notes (Signed)
Patient has cough and trouble breathing, i took vitals and noted low saturation of 88-91% on room air. I placed nasal canula with 2 litres oxygen and got 97 %. I notified Dr. Read Drivers on patient arrival.

## 2012-01-05 NOTE — ED Notes (Signed)
Urine output with "hat" was 600 CC's. Unable to catch first urine voided after lasix treatment.

## 2012-01-05 NOTE — ED Notes (Signed)
Pt presents to ED today with [facility] that increases with movement and exertion.

## 2012-01-05 NOTE — ED Provider Notes (Signed)
History     CSN: [id number]  Arrival date & time 01/05/12  0255   None     Chief Complaint  Patient presents with  . Shortness of Breath    (Consider location/radiation/quality/duration/timing/severity/associated sxs/prior treatment) HPI This is an 78 year old white female with history of CHF and bronchitis. She was recently seen by her PCP and her medications were adjusted due to persistent shortness of breath. Her shortness of breath acutely worsened yesterday evening and is become moderate to severe, especially when lying supine. Her breathing improved sitting up on several pillows but is not back to her baseline. She's used her albuterol without adequate relief. She has been coughing. She denies chest pain, fever or chills. She is having some epigastric pain and tenderness. She states she has chronic edema of the lower legs, right greater than left.  Past Medical History  Diagnosis Date  . CHF (congestive heart failure)   . Hypothyroidism   . Arthritis   . Dyslipidemia   . Hypertension   . Hx of hemorrhoidectomy   . PAF (paroxysmal atrial fibrillation)   . Cataract   .  Acute respiratiory failure requiring BiPap 06/24/2011  . Aortic valvular stenosis, moderate to severe 06/24/2011  . Atrial fibrillation     Past Surgical History  Procedure Date  . Eye surgery     CATARACT  . Cardioversion 06/19/2011    Procedure: CARDIOVERSION;  Surgeon: Thurmon Fair, MD;  Location: [facility] OR;  Service: Cardiovascular;  Laterality: N/A;  . Appendectomy   . Angioplasty     Family History  Problem Relation Age of Onset  . Heart disease Brother   . Heart disease Brother   . Heart disease Mother   . Heart disease Father   . Cancer Mother     uterus    History  Substance Use Topics  . Smoking status: Never Smoker   . Smokeless tobacco: Never Used  . Alcohol Use: No    OB History    Grav Para Term Preterm Abortions TAB SAB Ect Mult Living                  Review of Systems    All other systems reviewed and are negative.    Allergies  Sulfa antibiotics  Home Medications   Current Outpatient Rx  Name Route Sig Dispense Refill  . ACETAMINOPHEN ER 650 MG PO TBCR Oral Take 650 mg by mouth 3 (three) times daily.    . ALBUTEROL SULFATE HFA 108 (90 BASE) MCG/ACT IN AERS Inhalation Inhale 2 puffs into the lungs every 6 (six) hours as needed for wheezing. 1 Inhaler 0  . AMIODARONE HCL 200 MG PO TABS Oral Take 200 mg by mouth daily.     Marland Kitchen AMLODIPINE BESYLATE 5 MG PO TABS Oral Take 5 mg by mouth daily.    . ASPIRIN EC 81 MG PO TBEC Oral Take 81 mg by mouth daily.    . ATORVASTATIN CALCIUM 40 MG PO TABS Oral Take 40 mg by mouth daily.     Marland Kitchen BENAZEPRIL HCL 40 MG PO TABS Oral Take 40 mg by mouth daily.     Marland Kitchen EZETIMIBE 10 MG PO TABS Oral Take 10 mg by mouth daily.      Marland Kitchen FLUTICASONE PROPIONATE 50 MCG/ACT NA SUSP  Use 2 sprays in each nostril twice a day for the next two weeks 16 g 0  . FOLIC ACID 1 MG PO TABS Oral Take 2 mg by mouth daily.     Marland Kitchen  FUROSEMIDE 40 MG PO TABS Oral Take 40 mg by mouth daily.     Marland Kitchen HYDRALAZINE HCL 50 MG PO TABS Oral Take 50 mg by mouth 3 (three) times daily.     Marland Kitchen LEVOTHYROXINE SODIUM 88 MCG PO TABS Oral Take 88 mcg by mouth daily.      Marland Kitchen LOSARTAN POTASSIUM 100 MG PO TABS Oral Take 1 tablet (100 mg total) by mouth daily. 30 tablet 5  . METOPROLOL TARTRATE 50 MG PO TABS Oral Take 25 mg by mouth 2 (two) times daily.    . CENTRUM SILVER PO TABS Oral Take 1 tablet by mouth daily.      . WARFARIN SODIUM 5 MG PO TABS Oral Take 1.25-2.5 mg by mouth daily. Takes 2.5 mg (1/2 tab) on Sun tues, wed ,thurs and sat  And1.25 mg ( 1/4 tab) on Mon and friday      BP 176/69  Pulse 68  Temp 97.6 F (36.4 C) (Oral)  Resp 26  SpO2 99%  Physical Exam General: Well-developed, well-nourished female in no acute distress; appearance consistent with age of record HENT: normocephalic, atraumatic Eyes: pupils equal round and reactive to light; extraocular muscles  intact Neck: supple Heart: regular rate and rhythm; distant sounds Lungs: Decreased air movement bilaterally without wheezing; tachypnea Abdomen: soft; nondistended; epigastric tenderness; bowel sounds present Extremities: No deformity; full range of motion; 1+ pitting edema of the lower legs Neurologic: Awake, alert and oriented; motor function intact in all extremities and symmetric; no facial droop Skin: Warm and dry Psychiatric: Normal mood and affect    ED Course  Procedures (including critical care time)     MDM   Nursing notes and vitals signs, including pulse oximetry, reviewed.  Summary of this visit's results, reviewed by myself:  Labs:  Results for orders placed during the hospital encounter of 01/05/12  COMPREHENSIVE METABOLIC PANEL      Component Value Range   Sodium 141  135 - 145 mEq/L   Potassium 4.1  3.5 - 5.1 mEq/L   Chloride 105  96 - 112 mEq/L   CO2 24  19 - 32 mEq/L   Glucose, Bld 130 (*) 70 - 99 mg/dL   BUN 18  6 - 23 mg/dL   Creatinine, Ser 1.61 (*) 0.50 - 1.10 mg/dL   Calcium 9.2  8.4 - 09.6 mg/dL   Total Protein 7.6  6.0 - 8.3 g/dL   Albumin 4.0  3.5 - 5.2 g/dL   AST 27  0 - 37 U/L   ALT 25  0 - 35 U/L   Alkaline Phosphatase 73  39 - 117 U/L   Total Bilirubin 0.5  0.3 - 1.2 mg/dL   GFR calc non Af Amer 36 (*) >90 mL/min   GFR calc Af Amer 42 (*) >90 mL/min  CBC WITH DIFFERENTIAL      Component Value Range   WBC 12.0 (*) 4.0 - 10.5 K/uL   RBC 3.86 (*) 3.87 - 5.11 MIL/uL   Hemoglobin 11.5 (*) 12.0 - 15.0 g/dL   HCT 04.5 (*) 40.9 - 81.1 %   MCV 91.7  78.0 - 100.0 fL   MCH 29.8  26.0 - 34.0 pg   MCHC 32.5  30.0 - 36.0 g/dL   RDW 91.4  78.2 - 95.6 %   Platelets 241  150 - 400 K/uL   Neutrophils Relative 85 (*) 43 - 77 %   Neutro Abs 10.2 (*) 1.7 - 7.7 K/uL   Lymphocytes Relative 7 (*)  12 - 46 %   Lymphs Abs 0.9  0.7 - 4.0 K/uL   Monocytes Relative 7  3 - 12 %   Monocytes Absolute 0.9  0.1 - 1.0 K/uL   Eosinophils Relative 0  0 - 5 %    Eosinophils Absolute 0.0  0.0 - 0.7 K/uL   Basophils Relative 0  0 - 1 %   Basophils Absolute 0.0  0.0 - 0.1 K/uL  LIPASE, BLOOD      Component Value Range   Lipase 21  11 - 59 U/L  TROPONIN I      Component Value Range   Troponin I <0.30  <0.30 ng/mL  PRO B NATRIURETIC PEPTIDE      Component Value Range   Pro B Natriuretic peptide (BNP) 736.7 (*) 0 - 450 pg/mL  POCT I-STAT 3, BLOOD GAS (G3P V)      Component Value Range   pH, Ven 7.383 (*) 7.250 - 7.300   pCO2, Ven 41.3 (*) 45.0 - 50.0 mmHg   pO2, Ven 61.0 (*) 30.0 - 45.0 mmHg   Bicarbonate 24.7 (*) 20.0 - 24.0 mEq/L   TCO2 26  0 - 100 mmol/L   O2 Saturation 91.0     Acid-base deficit 1.0  0.0 - 2.0 mmol/L   Patient temperature 97.6 F     Collection site RADIAL, ALLEN'S TEST ACCEPTABLE     Drawn by RT     Sample type VENOUS    PROTIME-INR      Component Value Range   Prothrombin Time 24.6 (*) 11.6 - 15.2 seconds   INR 2.18 (*) 0.00 - 1.49    Imaging Studies: Dg Chest 2 View  01/05/2012  *RADIOLOGY REPORT*  Clinical Data: Shortness of breath  CHEST - 2 VIEW  Comparison: 10/10/2011, 08/02/2004  Findings: Small left pleural effusion with associated consolidation.  Hyperinflation with coarse interstitial markings. Cardiomegaly.  Central vascular congestion.  Biapical opacities, right greater than left, similar to multiple priors. Osteopenia. Left shoulder arthroplasty.  IMPRESSION: Small left pleural effusion with associated consolidation; atelectasis versus pneumonia.  The pleural effusion has increased in size since May.  COPD.  Biapical scarring, right greater left, similar to priors.  Cardiomegaly with central vascular congestion.  Original Report Authenticated By: Waneta Martins, M.D.      EKG Interpretation:  Date & Time: 01/05/2012 3:25 AM  Rate: 66Rhythm: normal sinus rhythm  QRS Axis: normal  Intervals: PR prolonged and QT prolonged  ST/T Wave abnormalities: normal  Conduction Disutrbances:first-degree A-V block     Narrative Interpretation:   Old EKG Reviewed: none available  5:59 AM Air movement significantly improved after 2 Xopenex treatments and 40 mg of IV Lasix. Oxygen saturation is 97% on room air. Patient does not wish to be admitted to the hospital and at this time it hospitalization does not appear indicated. She was advised to contact her primary care physician later today for followup.       Hanley Seamen, MD 01/05/12 [phone]

## 2012-01-08 ENCOUNTER — Ambulatory Visit (INDEPENDENT_AMBULATORY_CARE_PROVIDER_SITE_OTHER): Payer: Medicare Other | Admitting: Family Medicine

## 2012-01-08 ENCOUNTER — Encounter: Payer: Self-pay | Admitting: Family Medicine

## 2012-01-08 VITALS — BP 114/60 | HR 53 | Wt 145.0 lb

## 2012-01-08 DIAGNOSIS — I359 Nonrheumatic aortic valve disorder, unspecified: Secondary | ICD-10-CM

## 2012-01-08 DIAGNOSIS — I350 Nonrheumatic aortic (valve) stenosis: Secondary | ICD-10-CM

## 2012-01-08 DIAGNOSIS — I10 Essential (primary) hypertension: Secondary | ICD-10-CM

## 2012-01-08 DIAGNOSIS — I509 Heart failure, unspecified: Secondary | ICD-10-CM

## 2012-01-08 DIAGNOSIS — I5043 Acute on chronic combined systolic (congestive) and diastolic (congestive) heart failure: Secondary | ICD-10-CM

## 2012-01-08 NOTE — Progress Notes (Signed)
  Subjective:    Patient ID: [name], female    DOB: [birth year], 78 y.o.   MRN: [id number]  HPI She is here after recent trip to the emergency room for evaluation and treatment of shortness of breath. The emergency room record was reviewed. It did show elevated BNP at 736 .She was given IV Lasix as well as Xopenex and she did respond to this. She does have an underlying history of aortic valvular disease as well as CHF. At this time she complains of shortness of breath and cough mainly when she lies down at night. She has not used the inhaler very frequently. In general she is doing much better. She does not have a cardiology appointment set up yet.   Review of Systems     Objective:   Physical Exam Alert and in no distress. 3/6 SEM is noted. Lungs are clear to auscultation.      Assessment & Plan:   1. Aortic valvular stenosis, moderate to severe  Ambulatory referral to Cardiology  2. Systolic and diastolic CHF, acute on chronic  Ambulatory referral to Cardiology  3. HTN (hypertension)  Ambulatory referral to Cardiology   I think that her cough and shortness of breath symptoms are more cardiac related than pulmonary.

## 2012-03-19 ENCOUNTER — Emergency Department (HOSPITAL_BASED_OUTPATIENT_CLINIC_OR_DEPARTMENT_OTHER): Payer: Medicare Other

## 2012-03-19 ENCOUNTER — Encounter (HOSPITAL_BASED_OUTPATIENT_CLINIC_OR_DEPARTMENT_OTHER): Payer: Self-pay | Admitting: *Deleted

## 2012-03-19 ENCOUNTER — Emergency Department (HOSPITAL_BASED_OUTPATIENT_CLINIC_OR_DEPARTMENT_OTHER)
Admission: EM | Admit: 2012-03-19 | Discharge: 2012-03-19 | Disposition: A | Discharge status: Home / Self Care | Payer: Medicare Other | Attending: Emergency Medicine | Admitting: Emergency Medicine

## 2012-03-19 DIAGNOSIS — Y9389 Activity, other specified: Secondary | ICD-10-CM | POA: Insufficient documentation

## 2012-03-19 DIAGNOSIS — Z9889 Other specified postprocedural states: Secondary | ICD-10-CM | POA: Insufficient documentation

## 2012-03-19 DIAGNOSIS — I359 Nonrheumatic aortic valve disorder, unspecified: Secondary | ICD-10-CM | POA: Insufficient documentation

## 2012-03-19 DIAGNOSIS — M549 Dorsalgia, unspecified: Secondary | ICD-10-CM | POA: Insufficient documentation

## 2012-03-19 DIAGNOSIS — E039 Hypothyroidism, unspecified: Secondary | ICD-10-CM | POA: Insufficient documentation

## 2012-03-19 DIAGNOSIS — I10 Essential (primary) hypertension: Secondary | ICD-10-CM | POA: Insufficient documentation

## 2012-03-19 DIAGNOSIS — I4891 Unspecified atrial fibrillation: Secondary | ICD-10-CM | POA: Insufficient documentation

## 2012-03-19 DIAGNOSIS — S60219A Contusion of unspecified wrist, initial encounter: Secondary | ICD-10-CM | POA: Insufficient documentation

## 2012-03-19 DIAGNOSIS — Z9861 Coronary angioplasty status: Secondary | ICD-10-CM | POA: Insufficient documentation

## 2012-03-19 DIAGNOSIS — N949 Unspecified condition associated with female genital organs and menstrual cycle: Secondary | ICD-10-CM | POA: Insufficient documentation

## 2012-03-19 DIAGNOSIS — I509 Heart failure, unspecified: Secondary | ICD-10-CM | POA: Insufficient documentation

## 2012-03-19 DIAGNOSIS — W010XXA Fall on same level from slipping, tripping and stumbling without subsequent striking against object, initial encounter: Secondary | ICD-10-CM | POA: Insufficient documentation

## 2012-03-19 DIAGNOSIS — D68318 Other hemorrhagic disorder due to intrinsic circulating anticoagulants, antibodies, or inhibitors: Secondary | ICD-10-CM | POA: Insufficient documentation

## 2012-03-19 DIAGNOSIS — Y9289 Other specified places as the place of occurrence of the external cause: Secondary | ICD-10-CM | POA: Insufficient documentation

## 2012-03-19 DIAGNOSIS — J9600 Acute respiratory failure, unspecified whether with hypoxia or hypercapnia: Secondary | ICD-10-CM | POA: Insufficient documentation

## 2012-03-19 DIAGNOSIS — E785 Hyperlipidemia, unspecified: Secondary | ICD-10-CM | POA: Insufficient documentation

## 2012-03-19 DIAGNOSIS — Z79899 Other long term (current) drug therapy: Secondary | ICD-10-CM | POA: Insufficient documentation

## 2012-03-19 DIAGNOSIS — H269 Unspecified cataract: Secondary | ICD-10-CM | POA: Insufficient documentation

## 2012-03-19 DIAGNOSIS — M25559 Pain in unspecified hip: Secondary | ICD-10-CM | POA: Insufficient documentation

## 2012-03-19 MED ORDER — HYDROCODONE-ACETAMINOPHEN 5-325 MG PO TABS: 1.0000 | ORAL_TABLET | Freq: Four times a day (QID) | ORAL | Status: DC | PRN

## 2012-03-19 MED ORDER — HYDROCODONE-ACETAMINOPHEN 5-325 MG PO TABS
1.0000 | ORAL_TABLET | Freq: Once | ORAL | Status: AC
  Administered 2012-03-19: 1 via ORAL
  Filled 2012-03-19: qty 1

## 2012-03-19 NOTE — ED Notes (Signed)
Fall. Does not know why she fell. Pain in her right wrist and right buttocks. Pain radiates down her right leg.

## 2012-03-19 NOTE — ED Notes (Signed)
Pt assisted with redress-was able to stand and take steps/pivot to w/c-assisted to waiting car-d/c home with husband-he states that he does have help to assist getting her in the house and she states she has walker at home-both agreed to return as needed

## 2012-03-20 NOTE — ED Provider Notes (Signed)
History     CSN: [id number]  Arrival date & time 03/19/12  1656   First MD Initiated Contact with Patient 03/19/12 1717      Chief Complaint  Patient presents with  . Fall    (Consider location/radiation/quality/duration/timing/severity/associated sxs/prior treatment) The history is provided by the patient and the spouse.  78 year old female with fall onto son's patio earlier today. She stumbled, no LOC no syncope or near syncope. Was able to get back up and walk to couch but then more soreness to right buttocks with radiation into thigh. The right wrist was bruised but not very painful and has good ROM. No head, neck or upper back injury. Some soreness to very low back. No numbness to right foot. She is on Coumadin for Afib.   Past Medical History  Diagnosis Date  . CHF (congestive heart failure)   . Hypothyroidism   . Arthritis   . Dyslipidemia   . Hypertension   . Hx of hemorrhoidectomy   . PAF (paroxysmal atrial fibrillation)   . Cataract   .  Acute respiratiory failure requiring BiPap 06/24/2011  . Aortic valvular stenosis, moderate to severe 06/24/2011  . Atrial fibrillation     Past Surgical History  Procedure Date  . Eye surgery     CATARACT  . Cardioversion 06/19/2011    Procedure: CARDIOVERSION;  Surgeon: Thurmon Fair, MD;  Location: [facility] OR;  Service: Cardiovascular;  Laterality: N/A;  . Appendectomy   . Angioplasty     Family History  Problem Relation Age of Onset  . Heart disease Brother   . Heart disease Brother   . Heart disease Mother   . Heart disease Father   . Cancer Mother     uterus    History  Substance Use Topics  . Smoking status: Never Smoker   . Smokeless tobacco: Never Used  . Alcohol Use: No    OB History    Grav Para Term Preterm Abortions TAB SAB Ect Mult Living                  Review of Systems  Constitutional: Negative for fever.  HENT: Negative for nosebleeds and neck pain.   Eyes: Negative for redness.    Respiratory: Negative for shortness of breath.   Cardiovascular: Negative for chest pain.  Gastrointestinal: Negative for nausea, vomiting and abdominal pain.  Genitourinary: Positive for pelvic pain. Negative for dysuria.  Musculoskeletal: Positive for back pain.  Skin: Negative for rash.  Neurological: Negative for syncope, weakness and headaches.  Hematological: Does not bruise/bleed easily.    Allergies  Sulfa antibiotics  Home Medications   Current Outpatient Rx  Name Route Sig Dispense Refill  . ACETAMINOPHEN ER 650 MG PO TBCR Oral Take 650 mg by mouth 3 (three) times daily.    . ALBUTEROL SULFATE HFA 108 (90 BASE) MCG/ACT IN AERS Inhalation Inhale 2 puffs into the lungs every 6 (six) hours as needed for wheezing. 1 Inhaler 0  . AMIODARONE HCL 200 MG PO TABS Oral Take 200 mg by mouth daily.     Marland Kitchen AMLODIPINE BESYLATE 5 MG PO TABS Oral Take 5 mg by mouth daily.    . ASPIRIN EC 81 MG PO TBEC Oral Take 81 mg by mouth daily.    . ATORVASTATIN CALCIUM 40 MG PO TABS Oral Take 40 mg by mouth daily.     Marland Kitchen EZETIMIBE 10 MG PO TABS Oral Take 10 mg by mouth daily.      Marland Kitchen  FOLIC ACID 1 MG PO TABS Oral Take 2 mg by mouth daily.     . FUROSEMIDE 40 MG PO TABS Oral Take 40 mg by mouth daily.     Marland Kitchen HYDRALAZINE HCL 50 MG PO TABS Oral Take 50 mg by mouth 3 (three) times daily.     Marland Kitchen HYDROCODONE-ACETAMINOPHEN 5-325 MG PO TABS Oral Take 1-2 tablets by mouth every 6 (six) hours as needed for pain. 20 tablet 0  . LEVOTHYROXINE SODIUM 88 MCG PO TABS Oral Take 88 mcg by mouth daily.      Marland Kitchen LOSARTAN POTASSIUM 100 MG PO TABS Oral Take 1 tablet (100 mg total) by mouth daily. 30 tablet 5  . METOPROLOL TARTRATE 50 MG PO TABS Oral Take 25 mg by mouth 2 (two) times daily.    . CENTRUM SILVER PO TABS Oral Take 1 tablet by mouth daily.      . WARFARIN SODIUM 5 MG PO TABS Oral Take 1.25-2.5 mg by mouth daily. Takes 2.5 mg (1/2 tab) on Sun tues, wed ,thurs and sat  And1.25 mg ( 1/4 tab) on Mon and friday       BP 149/61  Pulse 78  Temp 98 F (36.7 C) (Oral)  Resp 16  Wt 140 lb (63.504 kg)  SpO2 96%  Physical Exam  Nursing note and vitals reviewed. Constitutional: She is oriented to person, place, and time. She appears well-developed and well-nourished.  HENT:  Head: Normocephalic and atraumatic.  Mouth/Throat: Oropharynx is clear and moist.  Eyes: Conjunctivae normal and EOM are normal. Pupils are equal, round, and reactive to light.  Neck: Normal range of motion. Neck supple.  Cardiovascular: Normal rate, regular rhythm, normal heart sounds and intact distal pulses.   No murmur heard. Pulmonary/Chest: Effort normal and breath sounds normal. No respiratory distress.  Abdominal: Soft. Bowel sounds are normal. There is no tenderness.  Musculoskeletal: She exhibits tenderness. She exhibits no edema.       Right wrist with bruising and mild swelling, no tenderness to palpation, no snuff box tenderness, full ROM, good grip, [place] intact. No forearm or elbow or shoulder tenderness. Tender, right hip posteriorly and buttocks ischial tuberosity area. Distally femur leg and ankle/foot nontender. Good foot/toes cap refill.   Neurological: She is alert and oriented to person, place, and time. No cranial nerve deficit. She exhibits normal muscle tone. Coordination normal.  Skin: Skin is warm. No rash noted. No erythema.    ED Course  Procedures (including critical care time)  Labs Reviewed  PROTIME-INR - Abnormal; Notable for the following:    Prothrombin Time 22.1 (*)     INR 2.03 (*)     All other components within normal limits   Dg Lumbar Spine Complete  03/19/2012  *RADIOLOGY REPORT*  Clinical Data: Fall, now with low back pain  LUMBAR SPINE - COMPLETE 4+ VIEW  Comparison: None.  Findings:  There are five non-rib bearing lumbar type vertebral bodies with diminutive ribs noted bilaterally at T12.  Mild scoliotic curvature of the lumbar spine, convex to the right. There is minimal  straightening of the expected lumbar lordosis.  No definite anterolisthesis or retrolisthesis. No definite pars defects.  Vertebral body heights are preserved.  There is mild to moderate multilevel lumbar spine degenerative change, worst at L4 - L5 and L5 - S1 with disc space height loss, end plate irregularity and sclerosis.  Limited visualization of the bilateral SI joints is normal.  Extensive atherosclerotic calcifications within the abdominal aorta. Moderate  colonic stool burden.  Regional soft tissues are normal.  IMPRESSION: Mild to moderate multilevel lumbar spine degenerative change without definite acute findings.   Original Report Authenticated By: Waynard Reeds, M.D.    Dg Hip Bilateral Vito Berger  03/19/2012  *RADIOLOGY REPORT*  Clinical Data: Post fall, now with bilateral hip pain  BILATERAL HIP WITH PELVIS - 4+ VIEW  Comparison: None.  Findings:  No fracture or dislocation.  Bilateral hip joint spaces are preserved.  Limited visualization and adjacent pelvis is normal. Normal appearance of the pubic symphysis and bilateral SI joints. Degenerative change of the imaged lower lumbar spine is suspected.  Regional soft tissues are normal.  No radiopaque foreign body. Vascular calcifications.  IMPRESSION: No fracture.   Original Report Authenticated By: Waynard Reeds, M.D.    Ct Hip Right Wo Contrast  03/19/2012  *RADIOLOGY REPORT*  Clinical Data: Fall.  Unable bear weight on the right hip.  Pain.  CT OF THE RIGHT HIP WITHOUT CONTRAST  Technique:  Multidetector CT imaging was performed according to the standard protocol. Multiplanar CT image reconstructions were also generated.  Comparison: Bilateral hip radiographs 03/19/2012.  Findings: Right hip is located.  Mild degenerative changes are noted in the right hip.  There is no acute fracture or effusion. The visualized right hemi pelvis is intact.  Degenerative changes are noted at the SI joint.  The visualized pole contents are within normal  limits. Atherosclerotic calcifications are noted.  IMPRESSION:  1.  No acute abnormality of the right hip. 2.  Mild degenerative changes. 3.  Atherosclerosis.   Original Report Authenticated By: Jamesetta Orleans. MATTERN, M.D.      1. Hip pain       MDM  Extensive work up to rule out either hip or pelvic fracture, because of persistent pain with ambulation. Xray and CT studies without evidence of fracture. Patient with some improvement of pain with Norco in ED. Patient given options for temporary admission or discharge home. After extensive discussion of both options decided to go home. Norco script provided. Husband will contact PCM in morning for possible home nurse visit. They have family members that can help get her into home, there is a walker at home, and bedroom is on first floor.   No other injuries and not LOC or syncopal event. No distal thigh or leg pain. Right wrist is bruised but no significant tenderness to palpation or snuff box tenderness. Clinically not fractured or significant sprain. Patient did not want it xrayed.   INR is therapeutic.           Shelda Jakes, MD 03/20/12 [phone]

## 2012-03-31 ENCOUNTER — Encounter: Payer: Self-pay | Admitting: Family Medicine

## 2012-03-31 ENCOUNTER — Ambulatory Visit (INDEPENDENT_AMBULATORY_CARE_PROVIDER_SITE_OTHER): Payer: Medicare Other | Admitting: Family Medicine

## 2012-03-31 VITALS — BP 112/70 | HR 58

## 2012-03-31 DIAGNOSIS — S300XXA Contusion of lower back and pelvis, initial encounter: Secondary | ICD-10-CM

## 2012-03-31 MED ORDER — TRAMADOL HCL 50 MG PO TABS: 50.0000 mg | ORAL_TABLET | Freq: Three times a day (TID) | ORAL | Status: DC | PRN

## 2012-03-31 NOTE — Progress Notes (Signed)
  Subjective:    Patient ID: [name], female    DOB: 1923/06/07, 78 y.o.   MRN: [id number]  HPI She fell landing on her right buttock October 24. She was seen in an urgent care Center. The record was reviewed. X-ray and CT was negative. She states that she is roughly 50% better. She would like a pain medicine mainly to be used at night. During the day she is using Tylenol.   Review of Systems     Objective:   Physical Exam Full motion of the hip without pain. Small ecchymotic area noted on the right buttock and also on the right inner thigh. No tenderness to palpation of the hip or pelvis.       Assessment & Plan:   1. Contusion of buttock    continue Tylenol and I will give her tramadol to help mainly with nighttime pain. Reassured her that there is nothing to be concerned about.

## 2012-03-31 NOTE — Patient Instructions (Signed)
Heat for 20 minutes 3 times per day.  

## 2012-07-19 ENCOUNTER — Ambulatory Visit: Payer: Commercial Managed Care - PPO | Admitting: Emergency Medicine

## 2012-07-19 VITALS — BP 119/59 | HR 55 | Temp 98.0°F | Resp 17 | Ht 63.0 in | Wt 139.0 lb

## 2012-07-19 DIAGNOSIS — R197 Diarrhea, unspecified: Secondary | ICD-10-CM

## 2012-07-19 LAB — POCT CBC
Granulocyte percent: 76.1 %G (ref 37–80)
Hemoglobin: 11.2 g/dL — AB (ref 12.2–16.2)
Lymph, poc: 1.4 (ref 0.6–3.4)
MCHC: 30.1 g/dL — AB (ref 31.8–35.4)
MPV: 8.6 fL (ref 0–99.8)
POC Granulocyte: 6.5 (ref 2–6.9)
POC LYMPH PERCENT: 16.5 %L (ref 10–50)
POC MID %: 7.4 %M (ref 0–12)
RDW, POC: 16 %

## 2012-07-19 MED ORDER — LOPERAMIDE HCL 2 MG PO TABS: ORAL_TABLET | ORAL | Status: DC

## 2012-07-19 NOTE — Patient Instructions (Addendum)
Clear Liquid Diet The clear liquid dietconsists of foods that are liquid or will become liquid at room temperature.You should be able to see through the liquid and beverages. Examples of foods allowed on a clear liquid diet include fruit juice, broth or bouillon, gelatin, or frozen ice pops. The purpose of this diet is to provide necessary fluid, electrolytes such as sodium and potassium, and energy to keep the body functioning during times when you are not able to consume a regular diet.A clear liquid diet should not be continued for long periods of time as it is not nutritionally adequate.  REASONS FOR USING A CLEAR LIQUID DIET  In sudden onset (acute) conditions for a patient before or after surgery.  As the first step in oral feeding.  For fluid and electrolyte replacement in diarrheal diseases.  As a diet before certain medical tests are performed. ADEQUACY The clear liquid diet is adequate only in ascorbic acid, according to the Recommended Dietary Allowances of the [employer]. CHOOSING FOODS Breads and Starches  Allowed:  None are allowed.  Avoid: All are avoided. Vegetables  Allowed:  Strained tomato or vegetable juice.  Avoid: Any others. Fruit  Allowed:  Strained fruit juices and fruit drinks. Include 1 serving of citrus or vitamin C-enriched fruit juice daily.  Avoid: Any others. Meat and Meat Substitutes  Allowed:  None are allowed.  Avoid: All are avoided. Milk  Allowed:  None are allowed.  Avoid: All are avoided. Soups and Combination Foods  Allowed:  Clear bouillon, broth, or strained broth-based soups.  Avoid: Any others. Desserts and Sweets  Allowed:  Sugar, honey. High protein gelatin. Flavored gelatin, ices, or frozen ice pops that do not contain milk.  Avoid: Any others. Fats and Oils  Allowed:  None are allowed.  Avoid: All are avoided. Beverages  Allowed: Cereal beverages, coffee (regular or decaffeinated), tea, or soda  at the discretion of your caregiver.  Avoid: Any others. Condiments  Allowed:  Iodized salt.  Avoid: Any others, including pepper. Supplements  Allowed:  Liquid nutrition beverages.  Avoid: Any others that contain lactose or fiber. SAMPLE MEAL PLAN Breakfast  4 oz (120 mL) strained orange juice.   to 1 cup (125 to 250 mL) gelatin (plain or fortified).  1 cup (250 mL) beverage (coffee or tea).  Sugar, if desired. Midmorning Snack   cup (125 mL) gelatin (plain or fortified). Lunch  1 cup (250 mL) broth or consomm.  4 oz (120 mL) strained grapefruit juice.   cup (125 mL) gelatin (plain or fortified).  1 cup (250 mL) beverage (coffee or tea).  Sugar, if desired. Midafternoon Snack   cup (125 mL) fruit ice.   cup (125 mL) strained fruit juice. Dinner  1 cup (250 mL) broth or consomm.   cup (125 mL) cranberry juice.   cup (125 mL) flavored gelatin (plain or fortified).  1 cup (250 mL) beverage (coffee or tea).  Sugar, if desired. Evening Snack  4 oz (120 mL) strained apple juice (vitamin C-fortified).   cup (125 mL) flavored gelatin (plain or fortified). Document Released: 05/13/2005 Document Revised: 08/05/2011 Document Reviewed: 08/10/2010 [facility] Patient Information 2013 [place], [place]. Diarrhea Infections caused by germs (bacterial) or a virus commonly cause diarrhea. Your caregiver has determined that with time, rest and fluids, the diarrhea should improve. In general, eat normally while drinking more water than usual. Although water may prevent dehydration, it does not contain salt and minerals (electrolytes). Broths, weak tea without caffeine and oral  rehydration solutions (ORS) replace fluids and electrolytes. Small amounts of fluids should be taken frequently. Large amounts at one time may not be tolerated. Plain water may be harmful in infants and the elderly. Oral rehydrating solutions (ORS) are available at pharmacies and grocery stores.  ORS replace water and important electrolytes in proper proportions. Sports drinks are not as effective as ORS and may be harmful due to sugars worsening diarrhea.  ORS is especially recommended for use in children with diarrhea. As a general guideline for children, replace any new fluid losses from diarrhea and/or vomiting with ORS as follows:  If your child weighs 22 pounds or under (10 kg or less), give 60-120 mL ( -  cup or 2 - 4 ounces) of ORS for each episode of diarrheal stool or vomiting episode.  If your child weighs more than 22 pounds (more than 10 kgs), give 120-240 mL ( - 1 cup or 4 - 8 ounces) of ORS for each diarrheal stool or episode of vomiting.  While correcting for dehydration, children should eat normally. However, foods high in sugar should be avoided because this may worsen diarrhea. Large amounts of carbonated soft drinks, juice, gelatin desserts and other highly sugared drinks should be avoided.  After correction of dehydration, other liquids that are appealing to the child may be added. Children should drink small amounts of fluids frequently and fluids should be increased as tolerated. Children should drink enough fluids to keep urine clear or pale yellow.  Adults should eat normally while drinking more fluids than usual. Drink small amounts of fluids frequently and increase as tolerated. Drink enough fluids to keep urine clear or pale yellow. Broths, weak decaffeinated tea, lemon lime soft drinks (allowed to go flat) and ORS replace fluids and electrolytes.  Avoid:  Carbonated drinks.  Juice.  Extremely hot or cold fluids.  Caffeine drinks.  Fatty, greasy foods.  Alcohol.  Tobacco.  Too much intake of anything at one time.  Gelatin desserts.  Probiotics are active cultures of beneficial bacteria. They may lessen the amount and number of diarrheal stools in adults. Probiotics can be found in yogurt with active cultures and in supplements.  Wash hands  well to avoid spreading bacteria and virus.  Anti-diarrheal medications are not recommended for infants and children.  Only take over-the-counter or prescription medicines for pain, discomfort or fever as directed by your caregiver. Do not give aspirin to children because it may cause Reye's Syndrome.  For adults, ask your caregiver if you should continue all prescribed and over-the-counter medicines.  If your caregiver has given you a follow-up appointment, it is very important to keep that appointment. Not keeping the appointment could result in a chronic or permanent injury, and disability. If there is any problem keeping the appointment, you must call back to this facility for assistance. SEEK IMMEDIATE MEDICAL CARE IF:   You or your child is unable to keep fluids down or other symptoms or problems become worse in spite of treatment.  Vomiting or diarrhea develops and becomes persistent.  There is vomiting of blood or bile (green material).  There is blood in the stool or the stools are black and tarry.  There is no urine output in 6-8 hours or there is only a small amount of very dark urine.  Abdominal pain develops, increases or localizes.  You have a fever.  Your baby is older than 3 months with a rectal temperature of 102 F (38.9 C) or higher.  Your baby  is 2 months old or younger with a rectal temperature of 100.4 F (38 C) or higher.  You or your child develops excessive weakness, dizziness, fainting or extreme thirst.  You or your child develops a rash, stiff neck, severe headache or become irritable or sleepy and difficult to awaken. MAKE SURE YOU:   Understand these instructions.  Will watch your condition.  Will get help right away if you are not doing well or get worse. Document Released: 05/03/2002 Document Revised: 08/05/2011 Document Reviewed: 03/20/2009 [facility] Patient Information 2013 [place], [place].

## 2012-07-19 NOTE — Progress Notes (Signed)
[facility] [address] [place] [address] [phone]  Date:  07/19/2012   Name:  [name]   DOB:  [birth year]   MRN:  [id number]  PCP:  Carollee Herter, MD    Chief Complaint: Diarrhea   History of Present Illness:  [name] is a 79 y.o. very pleasant female patient who presents with the following:  Diarrhea since Monday.  Had improvement on Wednesday but worsened again.  No blood mucous or pus in stools.  8 stools last night.  Just watery and profuse.  No response to imodium she says.  No fever or chills.  Some lower abdominal pain. No sick contacts.  No travel or sketchy water.  Last colonoscopy 6 years ago and was clean by her recollection.  Patient Active Problem List  Diagnosis  . Paroxysmal atrial fibrillation, recurrent  . Systolic and diastolic CHF, acute on chronic  . Hyperlipidemia  . HTN (hypertension)  . Hypothyroidism  . Coronary artery disease  .  Acute respiratiory failure requiring BiPap  . Aortic valvular stenosis, moderate to severe  . Accelerated hypertension - SBPs > on admission with Afib RVR -- resulting in acute diastolic HF  . Pleural effusion, left  . Chronic cough    Past Medical History  Diagnosis Date  . CHF (congestive heart failure)   . Hypothyroidism   . Arthritis   . Dyslipidemia   . Hypertension   . Hx of hemorrhoidectomy   . PAF (paroxysmal atrial fibrillation)   . Cataract   .  Acute respiratiory failure requiring BiPap 06/24/2011  . Aortic valvular stenosis, moderate to severe 06/24/2011  . Atrial fibrillation   . Allergy     Past Surgical History  Procedure Laterality Date  . Eye surgery      CATARACT  . Cardioversion  06/19/2011    Procedure: CARDIOVERSION;  Surgeon: Thurmon Fair, MD;  Location: [facility] OR;  Service: Cardiovascular;  Laterality: N/A;  . Appendectomy    . Angioplasty    . Spine surgery      History  Substance Use Topics  . Smoking status: Never Smoker    . Smokeless tobacco: Never Used  . Alcohol Use: No    Family History  Problem Relation Age of Onset  . Heart disease Brother   . Heart disease Brother   . Heart disease Mother   . Cancer Mother     uterus  . Heart disease Father     Allergies  Allergen Reactions  . Sulfa Antibiotics Itching and Rash    Medication list has been reviewed and updated.  Current Outpatient Prescriptions on File Prior to Visit  Medication Sig Dispense Refill  . acetaminophen (TYLENOL) 650 MG CR tablet Take 650 mg by mouth 3 (three) times daily.      Marland Kitchen amiodarone (PACERONE) 200 MG tablet Take 200 mg by mouth daily.       Marland Kitchen amLODipine (NORVASC) 5 MG tablet Take 5 mg by mouth daily.      Marland Kitchen aspirin EC 81 MG tablet Take 81 mg by mouth daily.      Marland Kitchen atorvastatin (LIPITOR) 40 MG tablet Take 40 mg by mouth daily.       Marland Kitchen ezetimibe (ZETIA) 10 MG tablet Take 10 mg by mouth daily.        . folic acid (FOLVITE) 1 MG tablet Take 2 mg by mouth daily.       . furosemide (LASIX) 40 MG  tablet Take 40 mg by mouth daily.       . hydrALAZINE (APRESOLINE) 50 MG tablet Take 50 mg by mouth 3 (three) times daily.       Marland Kitchen levothyroxine (SYNTHROID, LEVOTHROID) 88 MCG tablet Take 88 mcg by mouth daily.        Marland Kitchen losartan (COZAAR) 100 MG tablet Take 1 tablet (100 mg total) by mouth daily.  30 tablet  5  . metoprolol (LOPRESSOR) 50 MG tablet Take 25 mg by mouth 2 (two) times daily.      . Multiple Vitamins-Minerals (CENTRUM SILVER) tablet Take 1 tablet by mouth daily.        Marland Kitchen warfarin (COUMADIN) 5 MG tablet Take 1.25-2.5 mg by mouth daily. Takes 2.5 mg (1/2 tab) on Sun tues, wed ,thurs and sat  And1.25 mg ( 1/4 tab) on Mon and friday      . HYDROcodone-acetaminophen (NORCO/VICODIN) 5-325 MG per tablet Take 1-2 tablets by mouth every 6 (six) hours as needed for pain.  20 tablet  0  . traMADol (ULTRAM) 50 MG tablet Take 1 tablet (50 mg total) by mouth every 8 (eight) hours as needed for pain.  30 tablet  0   No current  facility-administered medications on file prior to visit.    Review of Systems:  As per HPI, otherwise negative.    Physical Examination: Filed Vitals:   07/19/12 0934  BP: 119/59  Pulse: 55  Temp: 98 F (36.7 C)  Resp: 17   Filed Vitals:   07/19/12 0934  Height: 5\' 3"  (1.6 m)  Weight: 139 lb (63.05 kg)   Body mass index is 24.63 kg/(m^2). Ideal Body Weight: Weight in (lb) to have BMI = 25: 140.8  GEN: WDWN, NAD, Non-toxic, A & O x 3 HEENT: Atraumatic, Normocephalic. Neck supple. No masses, No LAD. Ears and Nose: No external deformity. CV: RRR, No M/G/R. No JVD. No thrill. No extra heart sounds. PULM: CTA B, no wheezes, crackles, rhonchi. No retractions. No resp. distress. No accessory muscle use. ABD: S, NT, ND, +BS. No rebound. No HSM. EXTR: No c/c/e NEURO Normal gait.  PSYCH: Normally interactive. Conversant. Not depressed or anxious appearing.  Calm demeanor.    Assessment and Plan: Gastroenteritis Clears for 24 hours Follow up with FMD if not improved More aggressive use imodium  Carmelina Dane, MD  Results for orders placed in visit on 07/19/12  POCT CBC      Result Value Range   WBC 8.6  4.6 - 10.2 K/uL   Lymph, poc 1.4  0.6 - 3.4   POC LYMPH PERCENT 16.5  10 - 50 %L   MID (cbc) 0.6  0 - 0.9   POC MID % 7.4  0 - 12 %M   POC Granulocyte 6.5  2 - 6.9   Granulocyte percent 76.1  37 - 80 %G   RBC 4.07  4.04 - 5.48 M/uL   Hemoglobin 11.2 (*) 12.2 - 16.2 g/dL   HCT, POC 40.9 (*) 81.1 - 47.9 %   MCV 91.5  80 - 97 fL   MCH, POC 27.5  27 - 31.2 pg   MCHC 30.1 (*) 31.8 - 35.4 g/dL   RDW, POC 91.4     Platelet Count, POC 272  142 - 424 K/uL   MPV 8.6  0 - 99.8 fL

## 2012-08-12 ENCOUNTER — Ambulatory Visit: Payer: Self-pay | Admitting: Cardiovascular Disease

## 2012-08-12 DIAGNOSIS — Z7901 Long term (current) use of anticoagulants: Secondary | ICD-10-CM

## 2012-08-12 DIAGNOSIS — I480 Paroxysmal atrial fibrillation: Secondary | ICD-10-CM

## 2012-08-13 ENCOUNTER — Encounter: Payer: Self-pay | Admitting: Family Medicine

## 2012-10-27 ENCOUNTER — Ambulatory Visit (INDEPENDENT_AMBULATORY_CARE_PROVIDER_SITE_OTHER): Payer: Medicare Other | Admitting: Pharmacist Clinician (PhC)/ Clinical Pharmacy Specialist

## 2012-10-27 VITALS — BP 108/56 | HR 56

## 2012-10-27 DIAGNOSIS — I480 Paroxysmal atrial fibrillation: Secondary | ICD-10-CM

## 2012-10-27 DIAGNOSIS — I4891 Unspecified atrial fibrillation: Secondary | ICD-10-CM

## 2012-10-27 DIAGNOSIS — Z7901 Long term (current) use of anticoagulants: Secondary | ICD-10-CM

## 2012-10-27 LAB — POCT INR: INR: 2.3

## 2012-10-30 ENCOUNTER — Emergency Department (HOSPITAL_BASED_OUTPATIENT_CLINIC_OR_DEPARTMENT_OTHER)
Admission: EM | Admit: 2012-10-30 | Discharge: 2012-10-30 | Disposition: A | Discharge status: Home / Self Care | Payer: Medicare Other | Source: Home / Self Care | Attending: Emergency Medicine | Admitting: Emergency Medicine

## 2012-10-30 ENCOUNTER — Encounter (HOSPITAL_BASED_OUTPATIENT_CLINIC_OR_DEPARTMENT_OTHER): Payer: Self-pay | Admitting: Family Medicine

## 2012-10-30 ENCOUNTER — Emergency Department (HOSPITAL_BASED_OUTPATIENT_CLINIC_OR_DEPARTMENT_OTHER): Payer: Medicare Other

## 2012-10-30 ENCOUNTER — Inpatient Hospital Stay (HOSPITAL_BASED_OUTPATIENT_CLINIC_OR_DEPARTMENT_OTHER)
Admission: EM | Admit: 2012-10-30 | Discharge: 2012-11-06 | DRG: 291 | Disposition: A | Discharge status: Home / Self Care | Payer: Medicare Other | Attending: Internal Medicine | Admitting: Internal Medicine

## 2012-10-30 ENCOUNTER — Encounter (HOSPITAL_BASED_OUTPATIENT_CLINIC_OR_DEPARTMENT_OTHER): Payer: Self-pay | Admitting: *Deleted

## 2012-10-30 DIAGNOSIS — I4891 Unspecified atrial fibrillation: Secondary | ICD-10-CM | POA: Diagnosis present

## 2012-10-30 DIAGNOSIS — I959 Hypotension, unspecified: Secondary | ICD-10-CM

## 2012-10-30 DIAGNOSIS — J189 Pneumonia, unspecified organism: Secondary | ICD-10-CM | POA: Diagnosis present

## 2012-10-30 DIAGNOSIS — D649 Anemia, unspecified: Secondary | ICD-10-CM | POA: Diagnosis present

## 2012-10-30 DIAGNOSIS — M129 Arthropathy, unspecified: Secondary | ICD-10-CM | POA: Diagnosis present

## 2012-10-30 DIAGNOSIS — J9600 Acute respiratory failure, unspecified whether with hypoxia or hypercapnia: Secondary | ICD-10-CM | POA: Diagnosis present

## 2012-10-30 DIAGNOSIS — R059 Cough, unspecified: Secondary | ICD-10-CM | POA: Insufficient documentation

## 2012-10-30 DIAGNOSIS — I10 Essential (primary) hypertension: Secondary | ICD-10-CM

## 2012-10-30 DIAGNOSIS — I509 Heart failure, unspecified: Secondary | ICD-10-CM

## 2012-10-30 DIAGNOSIS — E785 Hyperlipidemia, unspecified: Secondary | ICD-10-CM | POA: Diagnosis present

## 2012-10-30 DIAGNOSIS — D72829 Elevated white blood cell count, unspecified: Secondary | ICD-10-CM

## 2012-10-30 DIAGNOSIS — J90 Pleural effusion, not elsewhere classified: Secondary | ICD-10-CM

## 2012-10-30 DIAGNOSIS — Z7901 Long term (current) use of anticoagulants: Secondary | ICD-10-CM

## 2012-10-30 DIAGNOSIS — I9589 Other hypotension: Secondary | ICD-10-CM | POA: Diagnosis not present

## 2012-10-30 DIAGNOSIS — Z8709 Personal history of other diseases of the respiratory system: Secondary | ICD-10-CM | POA: Insufficient documentation

## 2012-10-30 DIAGNOSIS — Z8669 Personal history of other diseases of the nervous system and sense organs: Secondary | ICD-10-CM | POA: Insufficient documentation

## 2012-10-30 DIAGNOSIS — R9431 Abnormal electrocardiogram [ECG] [EKG]: Secondary | ICD-10-CM

## 2012-10-30 DIAGNOSIS — Z8679 Personal history of other diseases of the circulatory system: Secondary | ICD-10-CM | POA: Insufficient documentation

## 2012-10-30 DIAGNOSIS — E876 Hypokalemia: Secondary | ICD-10-CM | POA: Diagnosis present

## 2012-10-30 DIAGNOSIS — K5900 Constipation, unspecified: Secondary | ICD-10-CM | POA: Diagnosis present

## 2012-10-30 DIAGNOSIS — I5033 Acute on chronic diastolic (congestive) heart failure: Secondary | ICD-10-CM | POA: Diagnosis present

## 2012-10-30 DIAGNOSIS — R05 Cough: Secondary | ICD-10-CM | POA: Insufficient documentation

## 2012-10-30 DIAGNOSIS — I350 Nonrheumatic aortic (valve) stenosis: Secondary | ICD-10-CM

## 2012-10-30 DIAGNOSIS — I5023 Acute on chronic systolic (congestive) heart failure: Secondary | ICD-10-CM

## 2012-10-30 DIAGNOSIS — J9801 Acute bronchospasm: Secondary | ICD-10-CM

## 2012-10-30 DIAGNOSIS — Z7982 Long term (current) use of aspirin: Secondary | ICD-10-CM | POA: Insufficient documentation

## 2012-10-30 DIAGNOSIS — Z79899 Other long term (current) drug therapy: Secondary | ICD-10-CM | POA: Insufficient documentation

## 2012-10-30 DIAGNOSIS — I5043 Acute on chronic combined systolic (congestive) and diastolic (congestive) heart failure: Principal | ICD-10-CM | POA: Diagnosis present

## 2012-10-30 DIAGNOSIS — R42 Dizziness and giddiness: Secondary | ICD-10-CM | POA: Insufficient documentation

## 2012-10-30 DIAGNOSIS — I480 Paroxysmal atrial fibrillation: Secondary | ICD-10-CM | POA: Diagnosis present

## 2012-10-30 DIAGNOSIS — E039 Hypothyroidism, unspecified: Secondary | ICD-10-CM | POA: Diagnosis present

## 2012-10-30 DIAGNOSIS — R0789 Other chest pain: Secondary | ICD-10-CM | POA: Insufficient documentation

## 2012-10-30 DIAGNOSIS — I2510 Atherosclerotic heart disease of native coronary artery without angina pectoris: Secondary | ICD-10-CM | POA: Diagnosis present

## 2012-10-30 DIAGNOSIS — Z66 Do not resuscitate: Secondary | ICD-10-CM | POA: Diagnosis present

## 2012-10-30 DIAGNOSIS — I359 Nonrheumatic aortic valve disorder, unspecified: Secondary | ICD-10-CM | POA: Diagnosis present

## 2012-10-30 DIAGNOSIS — Z9889 Other specified postprocedural states: Secondary | ICD-10-CM | POA: Insufficient documentation

## 2012-10-30 DIAGNOSIS — Z9861 Coronary angioplasty status: Secondary | ICD-10-CM

## 2012-10-30 DIAGNOSIS — J9601 Acute respiratory failure with hypoxia: Secondary | ICD-10-CM | POA: Diagnosis present

## 2012-10-30 HISTORY — DX: Atherosclerotic heart disease of native coronary artery without angina pectoris: I25.10

## 2012-10-30 HISTORY — DX: Shortness of breath: R06.02

## 2012-10-30 LAB — PROTIME-INR: Prothrombin Time: 23.3 seconds — ABNORMAL HIGH (ref 11.6–15.2)

## 2012-10-30 LAB — BASIC METABOLIC PANEL
Chloride: 103 mEq/L (ref 96–112)
GFR calc Af Amer: 46 mL/min — ABNORMAL LOW (ref >90)
Potassium: 3.8 mEq/L (ref 3.5–5.1)
Sodium: 140 mEq/L (ref 135–145)

## 2012-10-30 LAB — CBC WITH DIFFERENTIAL/PLATELET
Basophils Absolute: 0 10*3/uL (ref 0.0–0.1)
Basophils Relative: 0 % (ref 0–1)
Lymphocytes Relative: 5 % — ABNORMAL LOW (ref 12–46)
MCHC: 32.6 g/dL (ref 30.0–36.0)
Neutro Abs: 13.7 10*3/uL — ABNORMAL HIGH (ref 1.7–7.7)
Neutrophils Relative %: 87 % — ABNORMAL HIGH (ref 43–77)
RDW: 15.4 % (ref 11.5–15.5)
WBC: 15.8 10*3/uL — ABNORMAL HIGH (ref 4.0–10.5)

## 2012-10-30 LAB — TROPONIN I: Troponin I: <0.3 ng/mL (ref <0.30)

## 2012-10-30 LAB — PRO B NATRIURETIC PEPTIDE: Pro B Natriuretic peptide (BNP): 905.6 pg/mL — ABNORMAL HIGH (ref 0–450)

## 2012-10-30 MED ORDER — IPRATROPIUM BROMIDE 0.02 % IN SOLN
0.5000 mg | Freq: Once | RESPIRATORY_TRACT | Status: AC
  Administered 2012-10-30: 0.5 mg via RESPIRATORY_TRACT
  Filled 2012-10-30: qty 2.5

## 2012-10-30 MED ORDER — AMLODIPINE BESYLATE 5 MG PO TABS
5.0000 mg | ORAL_TABLET | Freq: Every day | ORAL | Status: DC
  Administered 2012-10-30 – 2012-10-31 (×2): 5 mg via ORAL
  Filled 2012-10-30 (×3): qty 1

## 2012-10-30 MED ORDER — WARFARIN 1.25 MG HALF TABLET
1.2500 mg | ORAL_TABLET | ORAL | Status: DC
  Administered 2012-10-30: 1.25 mg via ORAL
  Filled 2012-10-30 (×2): qty 1

## 2012-10-30 MED ORDER — ACETAMINOPHEN 325 MG PO TABS
650.0000 mg | ORAL_TABLET | Freq: Four times a day (QID) | ORAL | Status: DC | PRN
  Filled 2012-10-30: qty 2

## 2012-10-30 MED ORDER — ASPIRIN EC 81 MG PO TBEC
81.0000 mg | DELAYED_RELEASE_TABLET | Freq: Every day | ORAL | Status: DC
  Administered 2012-10-30 – 2012-11-06 (×8): 81 mg via ORAL
  Filled 2012-10-30 (×8): qty 1

## 2012-10-30 MED ORDER — LEVALBUTEROL HCL 0.63 MG/3ML IN NEBU
INHALATION_SOLUTION | RESPIRATORY_TRACT | Status: AC
  Filled 2012-10-30: qty 3

## 2012-10-30 MED ORDER — HYDRALAZINE HCL 50 MG PO TABS
50.0000 mg | ORAL_TABLET | Freq: Three times a day (TID) | ORAL | Status: DC
  Administered 2012-10-30 – 2012-10-31 (×4): 50 mg via ORAL
  Filled 2012-10-30 (×8): qty 1

## 2012-10-30 MED ORDER — FUROSEMIDE 10 MG/ML IJ SOLN
40.0000 mg | Freq: Three times a day (TID) | INTRAMUSCULAR | Status: DC
  Administered 2012-10-30 – 2012-10-31 (×3): 40 mg via INTRAVENOUS
  Filled 2012-10-30 (×6): qty 4

## 2012-10-30 MED ORDER — AMIODARONE HCL 200 MG PO TABS
200.0000 mg | ORAL_TABLET | Freq: Every day | ORAL | Status: DC
  Administered 2012-10-30 – 2012-11-01 (×3): 200 mg via ORAL
  Filled 2012-10-30 (×3): qty 1

## 2012-10-30 MED ORDER — FOLIC ACID 1 MG PO TABS
2.0000 mg | ORAL_TABLET | Freq: Every day | ORAL | Status: DC
  Administered 2012-10-30 – 2012-11-03 (×5): 2 mg via ORAL
  Filled 2012-10-30 (×5): qty 2

## 2012-10-30 MED ORDER — LEVALBUTEROL HCL 0.63 MG/3ML IN NEBU
0.6300 mg | INHALATION_SOLUTION | Freq: Once | RESPIRATORY_TRACT | Status: AC
  Administered 2012-10-30: 0.63 mg via RESPIRATORY_TRACT
  Filled 2012-10-30: qty 3

## 2012-10-30 MED ORDER — LEVOTHYROXINE SODIUM 88 MCG PO TABS
88.0000 ug | ORAL_TABLET | Freq: Every day | ORAL | Status: DC
  Administered 2012-10-30 – 2012-11-06 (×8): 88 ug via ORAL
  Filled 2012-10-30 (×9): qty 1

## 2012-10-30 MED ORDER — ALBUTEROL SULFATE (5 MG/ML) 0.5% IN NEBU
INHALATION_SOLUTION | RESPIRATORY_TRACT | Status: AC
  Administered 2012-10-30: 5 mg
  Filled 2012-10-30: qty 1

## 2012-10-30 MED ORDER — POTASSIUM CHLORIDE CRYS ER 20 MEQ PO TBCR
20.0000 meq | EXTENDED_RELEASE_TABLET | Freq: Two times a day (BID) | ORAL | Status: DC
  Administered 2012-10-30 – 2012-11-01 (×5): 20 meq via ORAL
  Filled 2012-10-30 (×7): qty 1

## 2012-10-30 MED ORDER — EZETIMIBE 10 MG PO TABS
10.0000 mg | ORAL_TABLET | Freq: Every day | ORAL | Status: DC
  Administered 2012-10-30 – 2012-11-06 (×8): 10 mg via ORAL
  Filled 2012-10-30 (×8): qty 1

## 2012-10-30 MED ORDER — ACETAMINOPHEN 325 MG PO TABS: 650.0000 mg | ORAL_TABLET | Freq: Three times a day (TID) | ORAL | Status: DC | PRN

## 2012-10-30 MED ORDER — METOPROLOL TARTRATE 25 MG PO TABS
25.0000 mg | ORAL_TABLET | Freq: Two times a day (BID) | ORAL | Status: DC
  Administered 2012-10-30 – 2012-11-06 (×14): 25 mg via ORAL
  Filled 2012-10-30 (×15): qty 1

## 2012-10-30 MED ORDER — LEVALBUTEROL HCL 0.63 MG/3ML IN NEBU
0.6300 mg | INHALATION_SOLUTION | Freq: Once | RESPIRATORY_TRACT | Status: AC
  Administered 2012-10-30: 0.63 mg via RESPIRATORY_TRACT

## 2012-10-30 MED ORDER — ACETAMINOPHEN ER 650 MG PO TBCR: 650.0000 mg | EXTENDED_RELEASE_TABLET | Freq: Three times a day (TID) | ORAL | Status: DC | PRN

## 2012-10-30 MED ORDER — WARFARIN - PHARMACIST DOSING INPATIENT
Freq: Every day | Status: DC
  Administered 2012-10-30: 18:00:00

## 2012-10-30 MED ORDER — FUROSEMIDE 10 MG/ML IJ SOLN
40.0000 mg | Freq: Once | INTRAMUSCULAR | Status: AC
  Administered 2012-10-30: 40 mg via INTRAVENOUS
  Filled 2012-10-30: qty 4

## 2012-10-30 MED ORDER — WARFARIN SODIUM 2.5 MG PO TABS
2.5000 mg | ORAL_TABLET | ORAL | Status: DC
  Administered 2012-10-31 – 2012-11-01 (×2): 2.5 mg via ORAL
  Filled 2012-10-30 (×2): qty 1

## 2012-10-30 MED ORDER — ATORVASTATIN CALCIUM 40 MG PO TABS
40.0000 mg | ORAL_TABLET | Freq: Every day | ORAL | Status: DC
  Administered 2012-10-30 – 2012-11-05 (×7): 40 mg via ORAL
  Filled 2012-10-30 (×8): qty 1

## 2012-10-30 NOTE — ED Notes (Signed)
MD at bedside. 

## 2012-10-30 NOTE — ED Notes (Signed)
Report called to unit RN. °

## 2012-10-30 NOTE — ED Provider Notes (Signed)
History     CSN: [id number]  Arrival date & time 10/30/12  0233   First MD Initiated Contact with Patient 10/30/12 0246      Chief Complaint  Patient presents with  . Shortness of Breath    (Consider location/radiation/quality/duration/timing/severity/associated sxs/prior treatment) HPI This is an 79 year old female who woke up short of breath after going to bed yesterday evening. Her shortness of breath is moderate, worse when lying supine. It is associated with chest tightness but no chest pain. She denies nausea or vomiting. She's had some lightheadedness with it. She has a primarily dry cough occasionally productive of clear sputum. She denies fever. She used her albuterol at home without relief. Nursing staff noted her to have an oxygen saturation of 87% on room air which improved to 97% on oxygen by nasal cannula.  Past Medical History  Diagnosis Date  . CHF (congestive heart failure)   . Hypothyroidism   . Arthritis   . Dyslipidemia   . Hypertension   . Hx of hemorrhoidectomy   . PAF (paroxysmal atrial fibrillation)   . Cataract   .  Acute respiratiory failure requiring BiPap 06/24/2011  . Aortic valvular stenosis, moderate to severe 06/24/2011  . Atrial fibrillation   . Allergy     Past Surgical History  Procedure Laterality Date  . Eye surgery      CATARACT  . Cardioversion  06/19/2011    Procedure: CARDIOVERSION;  Surgeon: Thurmon Fair, MD;  Location: [facility] OR;  Service: Cardiovascular;  Laterality: N/A;  . Appendectomy    . Angioplasty    . Spine surgery      Family History  Problem Relation Age of Onset  . Heart disease Brother   . Heart disease Brother   . Heart disease Mother   . Cancer Mother     uterus  . Heart disease Father     History  Substance Use Topics  . Smoking status: Never Smoker   . Smokeless tobacco: Never Used  . Alcohol Use: No    OB History   Grav Para Term Preterm Abortions TAB SAB Ect Mult Living                  Review  of Systems  All other systems reviewed and are negative.    Allergies  Sulfa antibiotics  Home Medications   Current Outpatient Rx  Name  Route  Sig  Dispense  Refill  . amiodarone (PACERONE) 200 MG tablet   Oral   Take 200 mg by mouth daily.          Marland Kitchen amLODipine (NORVASC) 5 MG tablet   Oral   Take 5 mg by mouth daily.         Marland Kitchen aspirin EC 81 MG tablet   Oral   Take 81 mg by mouth daily.         Marland Kitchen atorvastatin (LIPITOR) 40 MG tablet   Oral   Take 40 mg by mouth daily.          Marland Kitchen ezetimibe (ZETIA) 10 MG tablet   Oral   Take 10 mg by mouth daily.           . folic acid (FOLVITE) 1 MG tablet   Oral   Take 2 mg by mouth daily.          . furosemide (LASIX) 40 MG tablet   Oral   Take 40 mg by mouth daily.          Marland Kitchen  hydrALAZINE (APRESOLINE) 50 MG tablet   Oral   Take 50 mg by mouth 3 (three) times daily.          Marland Kitchen levothyroxine (SYNTHROID, LEVOTHROID) 88 MCG tablet   Oral   Take 88 mcg by mouth daily.           . metoprolol (LOPRESSOR) 50 MG tablet   Oral   Take 25 mg by mouth 2 (two) times daily.         . Multiple Vitamins-Minerals (CENTRUM SILVER) tablet   Oral   Take 1 tablet by mouth daily.           Marland Kitchen warfarin (COUMADIN) 5 MG tablet   Oral   Take 1.25-2.5 mg by mouth daily. Takes 2.5 mg (1/2 tab) on Sun tues, wed ,thurs and sat  And1.25 mg ( 1/4 tab) on Mon and friday         . acetaminophen (TYLENOL) 650 MG CR tablet   Oral   Take 650 mg by mouth 3 (three) times daily.         Marland Kitchen HYDROcodone-acetaminophen (NORCO/VICODIN) 5-325 MG per tablet   Oral   Take 1-2 tablets by mouth every 6 (six) hours as needed for pain.   20 tablet   0   . loperamide (IMODIUM A-D) 2 MG tablet      2 now and one following each loose stools as often as q1h max 8 in 24 hours   30 tablet   0   . EXPIRED: losartan (COZAAR) 100 MG tablet   Oral   Take 1 tablet (100 mg total) by mouth daily.   30 tablet   5   . traMADol (ULTRAM) 50 MG  tablet   Oral   Take 1 tablet (50 mg total) by mouth every 8 (eight) hours as needed for pain.   30 tablet   0     BP 158/70  Pulse 67  Temp(Src) 98.5 F (36.9 C) (Oral)  Resp 22  SpO2 92%  Physical Exam General: Well-developed, well-nourished female in no acute distress; appearance consistent with age of record HENT: normocephalic, atraumatic Eyes: pupils equal round and reactive to light; extraocular muscles intact; arcus senilis bilaterally Neck: supple Heart: regular rate and rhythm; distant sounds Lungs: Decreased air movement bilaterally; expiratory wheezing noted on coughing Abdomen: soft; nondistended; nontender; bowel sounds present Extremities: No deformity; full range of motion; trace edema of lower legs Neurologic: Awake, alert and oriented; motor function intact in all extremities and symmetric; no facial droop Skin: Warm and dry Psychiatric: Normal mood and affect    ED Course  Procedures (including critical care time)     MDM   Nursing notes and vitals signs, including pulse oximetry, reviewed.  Summary of this visit's results, reviewed by myself:  Labs:  Results for orders placed during the hospital encounter of 10/30/12 (from the past 24 hour(s))  CBC WITH DIFFERENTIAL     Status: Abnormal   Collection Time    10/30/12  3:04 AM      Result Value Range   WBC 15.8 (*) 4.0 - 10.5 K/uL   RBC 4.10  3.87 - 5.11 MIL/uL   Hemoglobin 12.5  12.0 - 15.0 g/dL   HCT 16.1  09.6 - 04.5 %   MCV 93.4  78.0 - 100.0 fL   MCH 30.5  26.0 - 34.0 pg   MCHC 32.6  30.0 - 36.0 g/dL   RDW 40.9  81.1 - 91.4 %  Platelets 268  150 - 400 K/uL   Neutrophils Relative % 87 (*) 43 - 77 %   Neutro Abs 13.7 (*) 1.7 - 7.7 K/uL   Lymphocytes Relative 5 (*) 12 - 46 %   Lymphs Abs 0.8  0.7 - 4.0 K/uL   Monocytes Relative 8  3 - 12 %   Monocytes Absolute 1.2 (*) 0.1 - 1.0 K/uL   Eosinophils Relative 0  0 - 5 %   Eosinophils Absolute 0.0  0.0 - 0.7 K/uL   Basophils Relative 0   0 - 1 %   Basophils Absolute 0.0  0.0 - 0.1 K/uL  BASIC METABOLIC PANEL     Status: Abnormal   Collection Time    10/30/12  3:04 AM      Result Value Range   Sodium 140  135 - 145 mEq/L   Potassium 3.8  3.5 - 5.1 mEq/L   Chloride 103  96 - 112 mEq/L   CO2 24  19 - 32 mEq/L   Glucose, Bld 137 (*) 70 - 99 mg/dL   BUN 24 (*) 6 - 23 mg/dL   Creatinine, Ser 1.61 (*) 0.50 - 1.10 mg/dL   Calcium 9.6  8.4 - 09.6 mg/dL   GFR calc non Af Amer 39 (*) >90 mL/min   GFR calc Af Amer 46 (*) >90 mL/min  TROPONIN I     Status: None   Collection Time    10/30/12  3:04 AM      Result Value Range   Troponin I <0.30  <0.30 ng/mL  PRO B NATRIURETIC PEPTIDE     Status: Abnormal   Collection Time    10/30/12  3:04 AM      Result Value Range   Pro B Natriuretic peptide (BNP) 905.6 (*) 0 - 450 pg/mL    Imaging Studies: Dg Chest 2 View  10/30/2012   *RADIOLOGY REPORT*  Clinical Data: Shortness of breath.  CHEST - 2 VIEW  Comparison: Chest radiograph performed 01/05/2012  Findings: The lungs are hyperexpanded, with flattening of the hemidiaphragms, compatible with COPD.  Small bilateral pleural effusions are suspected, slightly more prominent on the right in comparison to the prior study.  Vascular congestion is seen, with minimal bibasilar airspace opacities.  This could reflect mild interstitial edema.  The appearance is less typical for pneumonia. No pneumothorax is identified.  The heart is mildly enlarged.  Calcification is noted within the aortic arch.  No acute osseous abnormalities are seen.  A left shoulder arthroplasty is grossly unremarkable in appearance.  IMPRESSION:  1.  Vascular congestion and mild cardiomegaly, with minimal bibasilar airspace opacities and small bilateral pleural effusions. This could reflect mild interstitial edema.  The appearance is less typical for pneumonia. 2.  Findings of COPD.   Original Report Authenticated By: Tonia Ghent, M.D.      EKG Interpretation:  Date &  Time: 10/30/2012 2:52 AM  Rate: 70  Rhythm: normal sinus rhythm  QRS Axis: normal  Intervals: PR prolonged; QT prolonged  ST/T Wave abnormalities: normal  Conduction Disutrbances:first-degree A-V block   Narrative Interpretation:   Old EKG Reviewed: unchanged  4:04 AM Patient feels much better after Xopenex and Atrovent neb treatment. She is able to lie flat without dyspnea. She is satting 92-93% on room air. She states she wishes to go home at this time but will return should she worsen.       Hanley Seamen, MD 10/30/12 [phone]

## 2012-10-30 NOTE — Progress Notes (Signed)
ANTICOAGULATION CONSULT NOTE - Initial Consult  Pharmacy Consult for warfarin Indication: atrial fibrillation  Allergies  Allergen Reactions  . Sulfa Antibiotics Itching and Rash    Patient Measurements: Weight: 138 lb 3.7 oz (62.7 kg)  Vital Signs: Temp: 97.6 F (36.4 C) (06/06 0748) Temp src: Oral (06/06 1215) BP: 133/50 mmHg (06/06 1215) Pulse Rate: 95 (06/06 1215)  Labs:  Recent Labs  10/30/12 0304 10/30/12 0812  HGB 12.5  --   HCT 38.3  --   PLT 268  --   LABPROT  --  23.3*  INR  --  2.18*  CREATININE 1.20*  --   TROPONINI <0.30 <0.30    The CrCl is unknown because both a height and weight (above a minimum accepted value) are required for this calculation.   Medical History: Past Medical History  Diagnosis Date  . CHF (congestive heart failure)   . Hypothyroidism   . Arthritis   . Dyslipidemia   . Hypertension   . Hx of hemorrhoidectomy   . PAF (paroxysmal atrial fibrillation)   . Cataract   .  Acute respiratiory failure requiring BiPap 06/24/2011  . Aortic valvular stenosis, moderate to severe 06/24/2011  . Atrial fibrillation   . Allergy     Medications:  Prescriptions prior to admission  Medication Sig Dispense Refill  . acetaminophen (TYLENOL) 650 MG CR tablet Take 650 mg by mouth 3 (three) times daily.      . Amiodarone HCl (PACERONE PO) Take 1 tablet by mouth daily.      Marland Kitchen amLODipine (NORVASC) 5 MG tablet Take 5 mg by mouth daily.      Marland Kitchen aspirin EC 81 MG tablet Take 81 mg by mouth daily.      Marland Kitchen atorvastatin (LIPITOR) 40 MG tablet Take 40 mg by mouth daily.       Marland Kitchen ezetimibe (ZETIA) 10 MG tablet Take 10 mg by mouth daily.        . folic acid (FOLVITE) 1 MG tablet Take 2 mg by mouth daily.       . furosemide (LASIX) 40 MG tablet Take 40 mg by mouth daily.       . hydrALAZINE (APRESOLINE) 50 MG tablet Take 50 mg by mouth 3 (three) times daily.       Marland Kitchen levothyroxine (SYNTHROID, LEVOTHROID) 88 MCG tablet Take 88 mcg by mouth daily.       .  Losartan Potassium (COZAAR PO) Take 1 tablet by mouth daily.      . metoprolol (LOPRESSOR) 50 MG tablet Take 25 mg by mouth 2 (two) times daily.      . Multiple Vitamins-Minerals (CENTRUM SILVER) tablet Take 1 tablet by mouth daily.        Marland Kitchen warfarin (COUMADIN) 5 MG tablet Take 1.25-2.5 mg by mouth daily. Takes 2.5 mg (1/2 tab) on Sun tues, wed ,thurs and sat  And1.25 mg ( 1/4 tab) on Mon and friday        Assessment: 89 yof on chronic coumadin for history of afib to continue her coumadin while admitted. Her INR is therapeutic at 2.18, H/H 12.5/38.3 and plts 268. Per MD, scant amount of hemoptysis so will need to monitor closely.   Goal of Therapy:  INR 2-3   Plan:  1. Resume home regimen of coumadin 2.5mg  on Sat + Sun, Tues-Thurs and 1.25mg  on Monday and Friday. 2. Daily INR  Amayah Staheli, Drake Leach 10/30/2012,2:57 PM

## 2012-10-30 NOTE — ED Provider Notes (Addendum)
History     CSN: [id number]  Arrival date & time 10/30/12  1478   First MD Initiated Contact with Patient 10/30/12 [phone]      Chief Complaint  Patient presents with  . Cough    (Consider location/radiation/quality/duration/timing/severity/associated sxs/prior treatment) HPI Comments: Patient presents with cough and shortness of breath. She states this started yesterday evening when she lay down to go to sleep she had onset of shortness of breath and dry cough. She was seen here in the emergency room about 2:00 in the morning and was given nebulizer treatments. She was feeling much better after that and was discharged home. She states her shortness of breath intensified through the morning and she came back. She also had an episode of hemoptysis. She has a history of reactive airway disease but she says that she only uses her inhaler about once every couple months. She did not have a nebulizer machine at home. She says that she doesn't have a diagnosis of COPD or asthma. Chest has a history of atrial fibrillation and congestive heart failure. She currently is on Lasix. She denies any current chest pain. She had some chest tightness earlier today. She denies any fevers or chills. She has had some pedal edema. She states it's a little bit more than it typically does this morning.  Patient is a 79 y.o. female presenting with cough.  Cough Associated symptoms: shortness of breath   Associated symptoms: no chest pain, no chills, no diaphoresis, no fever, no headaches, no rash and no rhinorrhea     Past Medical History  Diagnosis Date  . CHF (congestive heart failure)   . Hypothyroidism   . Arthritis   . Dyslipidemia   . Hypertension   . Hx of hemorrhoidectomy   . PAF (paroxysmal atrial fibrillation)   . Cataract   .  Acute respiratiory failure requiring BiPap 06/24/2011  . Aortic valvular stenosis, moderate to severe 06/24/2011  . Atrial fibrillation   . Allergy     Past Surgical History   Procedure Laterality Date  . Eye surgery      CATARACT  . Cardioversion  06/19/2011    Procedure: CARDIOVERSION;  Surgeon: Thurmon Fair, MD;  Location: [facility] OR;  Service: Cardiovascular;  Laterality: N/A;  . Appendectomy    . Angioplasty    . Spine surgery      Family History  Problem Relation Age of Onset  . Heart disease Brother   . Heart disease Brother   . Heart disease Mother   . Cancer Mother     uterus  . Heart disease Father     History  Substance Use Topics  . Smoking status: Never Smoker   . Smokeless tobacco: Never Used  . Alcohol Use: No    OB History   Grav Para Term Preterm Abortions TAB SAB Ect Mult Living                  Review of Systems  Constitutional: Negative for fever, chills, diaphoresis and fatigue.  HENT: Negative for congestion, rhinorrhea and sneezing.   Eyes: Negative.   Respiratory: Positive for cough, chest tightness and shortness of breath.   Cardiovascular: Positive for leg swelling. Negative for chest pain.  Gastrointestinal: Negative for nausea, vomiting, abdominal pain, diarrhea and blood in stool.  Genitourinary: Negative for frequency, hematuria, flank pain and difficulty urinating.  Musculoskeletal: Negative for back pain and arthralgias.  Skin: Negative for rash.  Neurological: Negative for dizziness, speech difficulty, weakness, numbness  and headaches.    Allergies  Sulfa antibiotics  Home Medications   Current Outpatient Rx  Name  Route  Sig  Dispense  Refill  . acetaminophen (TYLENOL) 650 MG CR tablet   Oral   Take 650 mg by mouth 3 (three) times daily.         Marland Kitchen amiodarone (PACERONE) 200 MG tablet   Oral   Take 200 mg by mouth daily.          Marland Kitchen amLODipine (NORVASC) 5 MG tablet   Oral   Take 5 mg by mouth daily.         Marland Kitchen aspirin EC 81 MG tablet   Oral   Take 81 mg by mouth daily.         Marland Kitchen atorvastatin (LIPITOR) 40 MG tablet   Oral   Take 40 mg by mouth daily.          Marland Kitchen ezetimibe (ZETIA)  10 MG tablet   Oral   Take 10 mg by mouth daily.           . folic acid (FOLVITE) 1 MG tablet   Oral   Take 2 mg by mouth daily.          . furosemide (LASIX) 40 MG tablet   Oral   Take 40 mg by mouth daily.          . hydrALAZINE (APRESOLINE) 50 MG tablet   Oral   Take 50 mg by mouth 3 (three) times daily.          Marland Kitchen HYDROcodone-acetaminophen (NORCO/VICODIN) 5-325 MG per tablet   Oral   Take 1-2 tablets by mouth every 6 (six) hours as needed for pain.   20 tablet   0   . levothyroxine (SYNTHROID, LEVOTHROID) 88 MCG tablet   Oral   Take 88 mcg by mouth daily.           Marland Kitchen loperamide (IMODIUM A-D) 2 MG tablet      2 now and one following each loose stools as often as q1h max 8 in 24 hours   30 tablet   0   . EXPIRED: losartan (COZAAR) 100 MG tablet   Oral   Take 1 tablet (100 mg total) by mouth daily.   30 tablet   5   . metoprolol (LOPRESSOR) 50 MG tablet   Oral   Take 25 mg by mouth 2 (two) times daily.         . Multiple Vitamins-Minerals (CENTRUM SILVER) tablet   Oral   Take 1 tablet by mouth daily.           . traMADol (ULTRAM) 50 MG tablet   Oral   Take 1 tablet (50 mg total) by mouth every 8 (eight) hours as needed for pain.   30 tablet   0   . warfarin (COUMADIN) 5 MG tablet   Oral   Take 1.25-2.5 mg by mouth daily. Takes 2.5 mg (1/2 tab) on Sun tues, wed ,thurs and sat  And1.25 mg ( 1/4 tab) on Mon and friday           BP 125/56  Pulse 90  Temp(Src) 97.6 F (36.4 C) (Oral)  Resp 20  SpO2 94%  Physical Exam  Constitutional: She is oriented to person, place, and time. She appears well-developed and well-nourished.  HENT:  Head: Normocephalic and atraumatic.  Mouth/Throat: Oropharynx is clear and moist.  Eyes: Pupils are equal, round, and reactive to light.  Neck: Normal range of motion. Neck supple.  Cardiovascular: Normal rate and regular rhythm.   Murmur heard. Pulmonary/Chest: Effort normal. No respiratory distress. She  has wheezes (Mild expiratory wheezing bilaterally). She has rales (Crackles in the bases bilaterally). She exhibits no tenderness.  Abdominal: Soft. Bowel sounds are normal. There is no tenderness. There is no rebound and no guarding.  Musculoskeletal: Normal range of motion. She exhibits edema (2+ pain edema bilaterally). She exhibits no tenderness (Negative Homans sign).  Lymphadenopathy:    She has no cervical adenopathy.  Neurological: She is alert and oriented to person, place, and time.  Skin: Skin is warm and dry. No rash noted.  Psychiatric: She has a normal mood and affect.    ED Course  Procedures (including critical care time)  Results for orders placed during the hospital encounter of 10/30/12  TROPONIN I      Result Value Range   Troponin I <0.30  <0.30 ng/mL  PROTIME-INR      Result Value Range   Prothrombin Time 23.3 (*) 11.6 - 15.2 seconds   INR 2.18 (*) 0.00 - 1.49   Dg Chest 2 View  10/30/2012   *RADIOLOGY REPORT*  Clinical Data: Shortness of breath.  CHEST - 2 VIEW  Comparison: Chest radiograph performed 01/05/2012  Findings: The lungs are hyperexpanded, with flattening of the hemidiaphragms, compatible with COPD.  Small bilateral pleural effusions are suspected, slightly more prominent on the right in comparison to the prior study.  Vascular congestion is seen, with minimal bibasilar airspace opacities.  This could reflect mild interstitial edema.  The appearance is less typical for pneumonia. No pneumothorax is identified.  The heart is mildly enlarged.  Calcification is noted within the aortic arch.  No acute osseous abnormalities are seen.  A left shoulder arthroplasty is grossly unremarkable in appearance.  IMPRESSION:  1.  Vascular congestion and mild cardiomegaly, with minimal bibasilar airspace opacities and small bilateral pleural effusions. This could reflect mild interstitial edema.  The appearance is less typical for pneumonia. 2.  Findings of COPD.   Original  Report Authenticated By: Tonia Ghent, M.D.     Date: 10/30/2012  Rate: 72  Rhythm: normal sinus rhythm  QRS Axis: normal  Intervals: PR prolonged  ST/T Wave abnormalities: nonspecific ST/T changes  Conduction Disutrbances:first-degree A-V block   Narrative Interpretation:   Old EKG Reviewed: unchanged    1. CHF exacerbation       MDM  Patient with worsening shortness of breath and orthopnea since last night. She did seem to improve slightly with breathing treatments however she does have some ongoing refills and I feel that she has a CHF component to her shortness of breath. She was given dose of Lasix here in the ED. She had one episode of hemoptysis earlier today but no ongoing hemoptysis. There is no definitive evidence of pneumonia on chest x-ray and she is afebrile. I spoke with the hospitalist at [facility] cone his accepted the patient for transfer. She has no ischemic changes on EKG and has had 2 troponins have been normal. I reviewed her other labs from her previous visit last night.        Rolan Bucco, MD 10/30/12 1025  Rolan Bucco, MD 10/30/12 1026

## 2012-10-30 NOTE — ED Notes (Signed)
C/o shortness of breath and wheezing since Thursday evening, pt has hacking non-productive cough. Denies fever.

## 2012-10-30 NOTE — ED Notes (Signed)
Room Assignment received [id number].  Pt and family informed.  [facility] enroute

## 2012-10-30 NOTE — ED Notes (Signed)
Patient transported to X-ray 

## 2012-10-30 NOTE — ED Notes (Signed)
Pt returned from xray

## 2012-10-30 NOTE — ED Notes (Signed)
[facility] at bedside preparing for transfer.

## 2012-10-30 NOTE — ED Notes (Signed)
PO 87% upon entering room , pt placed on O2@2L  by RT, PO up to 97%.

## 2012-10-30 NOTE — H&P (Addendum)
[facility] History and Physical  [name] ZOX:[id number] DOB: [birth year] DOA: 10/30/2012  Referring physician:EDP PCP: Carollee Herter, MD  Specialists: Cardiology Dr. Royann Shivers  Chief Complaint: shortness of breath  HPI: [name] is a 79 y.o. female with past medical history of CAD, chronic systolic and diastolic CHF, moderate to severe aortic stenosis presents from home with the above complaint. Patient reports that she was in her usual state of health until last night, when she started experiencing acute onset dyspnea with wheezing. She does not have a history of COPD or asthma. She history inhaler last night, only felt transiently better but continued to have persistent orthopnea and dry cough, Went to [facility] ER in [facility] last night, she was treated with nebulizers and sent home following mild symptomatic improvement. Within 2 hours her symptoms recurred with dyspnea even at rest, dry cough and went back to the ER today. In ER, Her BNP was elevated compared to baseline and chest x-ray showed pulmonary vascular congestion. She was received 2 nebulizer treatments in the ER and 40 mg dose of IV Lasix.    Review of Systems: The patient denies anorexia, fever, weight loss,, vision loss, decreased hearing, hoarseness, chest pain, syncope, dyspnea on exertion, peripheral edema, balance deficits, hemoptysis, abdominal pain, melena, hematochezia, severe indigestion/heartburn, hematuria, incontinence, genital sores, muscle weakness, suspicious skin lesions, transient blindness, difficulty walking, depression, unusual weight change, abnormal bleeding, enlarged lymph nodes, angioedema, and breast masses.    Past Medical History  Diagnosis Date  . CHF (congestive heart failure)   . Hypothyroidism   . Arthritis   . Dyslipidemia   . Hypertension   . Hx of hemorrhoidectomy   . PAF (paroxysmal atrial fibrillation)   . Cataract   .  Acute respiratiory failure requiring  BiPap 06/24/2011  . Aortic valvular stenosis, moderate to severe 06/24/2011  . Atrial fibrillation   . Allergy    Past Surgical History  Procedure Laterality Date  . Eye surgery      CATARACT  . Cardioversion  06/19/2011    Procedure: CARDIOVERSION;  Surgeon: Thurmon Fair, MD;  Location: [facility] OR;  Service: Cardiovascular;  Laterality: N/A;  . Appendectomy    . Angioplasty    . Spine surgery     Social History:  reports that she has never smoked. She has never used smokeless tobacco. She reports that she does not drink alcohol or use illicit drugs. Lives at home with spouse  Allergies  Allergen Reactions  . Sulfa Antibiotics Itching and Rash    Family History  Problem Relation Age of Onset  . Heart disease Brother   . Heart disease Brother   . Heart disease Mother   . Cancer Mother     uterus  . Heart disease Father     Prior to Admission medications   Medication Sig Start Date End Date Taking? Authorizing Provider  acetaminophen (TYLENOL) 650 MG CR tablet Take 650 mg by mouth 3 (three) times daily.   Yes Historical Provider, MD  Amiodarone HCl (PACERONE PO) Take 1 tablet by mouth daily.   Yes Historical Provider, MD  amLODipine (NORVASC) 5 MG tablet Take 5 mg by mouth daily.   Yes Historical Provider, MD  aspirin EC 81 MG tablet Take 81 mg by mouth daily.   Yes Historical Provider, MD  atorvastatin (LIPITOR) 40 MG tablet Take 40 mg by mouth daily.    Yes Historical Provider, MD  ezetimibe (ZETIA) 10 MG tablet Take 10 mg by  mouth daily.     Yes Historical Provider, MD  folic acid (FOLVITE) 1 MG tablet Take 2 mg by mouth daily.    Yes Historical Provider, MD  furosemide (LASIX) 40 MG tablet Take 40 mg by mouth daily.    Yes Historical Provider, MD  hydrALAZINE (APRESOLINE) 50 MG tablet Take 50 mg by mouth 3 (three) times daily.    Yes Historical Provider, MD  levothyroxine (SYNTHROID, LEVOTHROID) 88 MCG tablet Take 88 mcg by mouth daily.    Yes Historical Provider, MD   Losartan Potassium (COZAAR PO) Take 1 tablet by mouth daily.   Yes Historical Provider, MD  metoprolol (LOPRESSOR) 50 MG tablet Take 25 mg by mouth 2 (two) times daily. 06/25/11  Yes Abelino Derrick, PA-C  Multiple Vitamins-Minerals (CENTRUM SILVER) tablet Take 1 tablet by mouth daily.     Yes Historical Provider, MD  warfarin (COUMADIN) 5 MG tablet Take 1.25-2.5 mg by mouth daily. Takes 2.5 mg (1/2 tab) on Sun tues, wed ,thurs and sat  And1.25 mg ( 1/4 tab) on Mon and friday   Yes Historical Provider, MD   Physical Exam: Filed Vitals:   10/30/12 0856 10/30/12 0933 10/30/12 1048 10/30/12 1215  BP:  125/56 126/59 133/50  Pulse: 77 90 91 95  Temp:      TempSrc:    Oral  Resp: 20 20 20 20   Weight:    62.7 kg (138 lb 3.7 oz)  SpO2: 94% 94% 93% 97%     General:  Is alert awake oriented x3 in no acute distress  HEENT: Face slightly flushed, PERR LA, EOMI, neck obese, unable to appreciate JVD or mucosa moist and pink  Cardiovascular: S1-S2 irregularly irregular rhythm, ejection systolic murmur appreciated  Respiratory:  Diminished breath sounds at the bases  Abdomen: Soft nontender nondistended positive bowel sounds no organomegaly  Skin: Warm no rashes no breakdown  Musculoskeletal: One plus edema no clubbing or cyanosis  Psychiatric: Appropriate mood and affect  Neurologic: Nonfocal  Labs on Admission:  Basic Metabolic Panel:  Recent Labs Lab 10/30/12 0304  NA 140  K 3.8  CL 103  CO2 24  GLUCOSE 137*  BUN 24*  CREATININE 1.20*  CALCIUM 9.6   Liver Function Tests: No results found for this basename: AST, ALT, ALKPHOS, BILITOT, PROT, ALBUMIN,  in the last 168 hours No results found for this basename: LIPASE, AMYLASE,  in the last 168 hours No results found for this basename: AMMONIA,  in the last 168 hours CBC:  Recent Labs Lab 10/30/12 0304  WBC 15.8*  NEUTROABS 13.7*  HGB 12.5  HCT 38.3  MCV 93.4  PLT 268   Cardiac Enzymes:  Recent Labs Lab  10/30/12 0304 10/30/12 0812  TROPONINI <0.30 <0.30    BNP (last 3 results)  Recent Labs  01/05/12 0350 10/30/12 0304  PROBNP 736.7* 905.6*   CBG: No results found for this basename: GLUCAP,  in the last 168 hours  Radiological Exams on Admission: Dg Chest 2 View  10/30/2012   *RADIOLOGY REPORT*  Clinical Data: Shortness of breath.  CHEST - 2 VIEW  Comparison: Chest radiograph performed 01/05/2012  Findings: The lungs are hyperexpanded, with flattening of the hemidiaphragms, compatible with COPD.  Small bilateral pleural effusions are suspected, slightly more prominent on the right in comparison to the prior study.  Vascular congestion is seen, with minimal bibasilar airspace opacities.  This could reflect mild interstitial edema.  The appearance is less typical for pneumonia. No pneumothorax is  identified.  The heart is mildly enlarged.  Calcification is noted within the aortic arch.  No acute osseous abnormalities are seen.  A left shoulder arthroplasty is grossly unremarkable in appearance.  IMPRESSION:  1.  Vascular congestion and mild cardiomegaly, with minimal bibasilar airspace opacities and small bilateral pleural effusions. This could reflect mild interstitial edema.  The appearance is less typical for pneumonia. 2.  Findings of COPD.   Original Report Authenticated By: Tonia Ghent, M.D.    EKG: Independently reviewed. NSR, no acute St twave changes  Assessment/Plan Active Problems:   Systolic and diastolic CHF, acute on chronic   Paroxysmal atrial fibrillation, recurrent   Hypothyroidism   Coronary artery disease   Aortic valvular stenosis, moderate to severe    1. Acute and chronic diastolic CHF, primarily from valvular heart disease -moderate to severe aortic stenosis on echo 1/13 -Diurese with IV Lasix 40 mg q. 8 hours -Potassium supplementation -Follow daily weights, I/Os -Repeat 2-D echocardiogram -Will notify [facility], followed by Dr.Croitoru  2. paroxysmal A.  Fib - Currently in A. Fib - Continue amiodarone, metoprolol, Coumadin per pharmacy  3. history of CAD: - Stable, EKG without acute ST-T wave changes - Continue aspirin, metoprolol - troponin x2 negative  4. Hypothyroidism: - continue Synthroid   DVt porph: On anticoagulation using Coumadin  Code Status:DNR Family Communication:none at bedside Disposition Plan: inpatient  Time spent:  [facility] Pager [phone]  If 7PM-7AM, please contact night-coverage [email or web address] Password [email or web address] 10/30/2012, 2:41 PM

## 2012-10-30 NOTE — Progress Notes (Signed)
Pt resting quietly in bed.  Denies any pain or discomfort with 02 96 on RA. Instructed to call for assistance if needed.  Pt verbalized understanding.  Amanda Pea, Charity fundraiser.

## 2012-10-30 NOTE — Progress Notes (Signed)
Utilization review completed. Panagiotis Oelkers, RN, BSN. 

## 2012-10-30 NOTE — ED Notes (Signed)
Attempted to call report to 71 and RN unavailable

## 2012-10-30 NOTE — Progress Notes (Signed)
[facility] transfer [name] 89/F with ?COPD, CHF, Afib Presented with SoB/Wheezing Initially came to ER last night was sent home after nebs yesterday Returned with SoB, wheezing this am to ER Cough with scant amt of hemoptysis Vitals stable, CXR with mild pum vasc congestion Accepted to Team 8 Tele  Zannie Cove, MD [phone]

## 2012-10-30 NOTE — ED Notes (Addendum)
Pt c/o cough and "feeling like I'm choking". Pt denies chest pain. Pt sts she was seen here last night and given 2 breathing tx with relief but the cough returned. Pt sts when she coughs "hard blood comes up"

## 2012-10-30 NOTE — ED Notes (Signed)
Report called to [facility] °

## 2012-10-31 DIAGNOSIS — I10 Essential (primary) hypertension: Secondary | ICD-10-CM

## 2012-10-31 DIAGNOSIS — R059 Cough, unspecified: Secondary | ICD-10-CM

## 2012-10-31 DIAGNOSIS — R05 Cough: Secondary | ICD-10-CM

## 2012-10-31 DIAGNOSIS — I5043 Acute on chronic combined systolic (congestive) and diastolic (congestive) heart failure: Principal | ICD-10-CM

## 2012-10-31 LAB — CBC
HCT: 35 % — ABNORMAL LOW (ref 36.0–46.0)
Hemoglobin: 11.5 g/dL — ABNORMAL LOW (ref 12.0–15.0)
MCH: 29.6 pg (ref 26.0–34.0)
MCV: 90 fL (ref 78.0–100.0)
RBC: 3.89 MIL/uL (ref 3.87–5.11)

## 2012-10-31 LAB — BASIC METABOLIC PANEL
BUN: 20 mg/dL (ref 6–23)
CO2: 29 mEq/L (ref 19–32)
Calcium: 8.8 mg/dL (ref 8.4–10.5)
Chloride: 101 mEq/L (ref 96–112)
Creatinine, Ser: 1.02 mg/dL (ref 0.50–1.10)
Glucose, Bld: 107 mg/dL — ABNORMAL HIGH (ref 70–99)

## 2012-10-31 MED ORDER — ZOLPIDEM TARTRATE 5 MG PO TABS: 5.0000 mg | ORAL_TABLET | Freq: Every evening | ORAL | Status: DC | PRN

## 2012-10-31 MED ORDER — DIPHENHYDRAMINE HCL 25 MG PO CAPS: 25.0000 mg | ORAL_CAPSULE | Freq: Every evening | ORAL | Status: DC | PRN

## 2012-10-31 MED ORDER — FUROSEMIDE 10 MG/ML IJ SOLN
40.0000 mg | Freq: Three times a day (TID) | INTRAMUSCULAR | Status: DC
  Administered 2012-10-31 (×2): 40 mg via INTRAVENOUS
  Filled 2012-10-31 (×3): qty 4

## 2012-10-31 NOTE — Progress Notes (Signed)
Pt BP is low at this time 91/50, Hydralazine held at this time. Pt stable, no c/o weakness or dizziness at this time no distress noticed.  We'll continue to monitor.

## 2012-10-31 NOTE — Progress Notes (Signed)
Pt c/o sob when ambulating from chair to bed, placed on 02 at 2L via [place]. SPO2 95%.

## 2012-10-31 NOTE — Progress Notes (Signed)
ANTICOAGULATION CONSULT NOTE - Follow Up Consult  Pharmacy Consult for warfarin Indication: atrial fibrillation  Allergies  Allergen Reactions  . Sulfa Antibiotics Itching and Rash    Patient Measurements: Height: 5\' 2"  (157.5 cm) Weight: 134 lb 11.2 oz (61.1 kg) IBW/kg (Calculated) : 50.1  Vital Signs: Temp: 97.8 F (36.6 C) (06/07 0700) Temp src: Oral (06/07 0700) BP: 125/69 mmHg (06/07 0700) Pulse Rate: 102 (06/07 0700)  Labs:  Recent Labs  10/30/12 0304 10/30/12 0812 10/31/12 0405  HGB 12.5  --  11.5*  HCT 38.3  --  35.0*  PLT 268  --  239  LABPROT  --  23.3* 25.5*  INR  --  2.18* 2.46*  CREATININE 1.20*  --  1.02  TROPONINI <0.30 <0.30  --     Estimated Creatinine Clearance: 33.4 ml/min (by C-G formula based on Cr of 1.02).   Medications:  Scheduled:  . amiodarone  200 mg Oral Daily  . amLODipine  5 mg Oral Daily  . aspirin EC  81 mg Oral Daily  . atorvastatin  40 mg Oral q1800  . ezetimibe  10 mg Oral Daily  . folic acid  2 mg Oral Daily  . furosemide  40 mg Intravenous Q8H  . hydrALAZINE  50 mg Oral TID  . levothyroxine  88 mcg Oral QAC breakfast  . metoprolol  25 mg Oral BID  . potassium chloride  20 mEq Oral BID  . warfarin  1.25 mg Oral Custom  . warfarin  2.5 mg Oral Custom  . Warfarin - Pharmacist Dosing Inpatient   Does not apply q1800    Assessment: 89 yof on chronic coumadin for history of afib to continue her coumadin while admitted. Her INR is therapeutic at 2.46. H/H down slightly from yesterday. Plts still wnl. No further note of hemoptysis, but will continue to monitor for s/sx of bleeding.  Goal of Therapy:  INR 2-3   Plan:  1. Continue home regimen of coumadin 2.5mg  on Sat + Sun, Tues-Thurs and 1.25mg  on Monday and Friday.  2. Daily INR  Lillia Pauls, PharmD Clinical Pharmacist Pager: [phone] 10/31/2012 9:34 AM

## 2012-10-31 NOTE — Progress Notes (Signed)
Report received from [place], [place]. Pt laying in bed no concerns at this time.

## 2012-10-31 NOTE — Progress Notes (Addendum)
[facility] PROGRESS NOTE  [name] ZOX:[id number] DOB: 1923-12-01 DOA: 10/30/2012 PCP: Carollee Herter, MD  Assessment/Plan  Acute and chronic diastolic CHF, primarily from valvular heart disease.  -1.6 L  Since yesterday and improvement in dyspnea. -moderate to severe aortic stenosis on echo 1/13  -Continue diuresis with IV Lasix 40 mg 3 times a day -Potassium supplementation  -Follow daily weights, I/Os  -Repeat 2-D echocardiogram  -[facility], followed by Dr.Croitoru, to consult  2. paroxysmal A. Fib  -  Telemetry: Atrial fibrillation with heart rate in the 90s to low 100s  - Continue amiodarone, metoprolol, Coumadin per pharmacy  -  INR is therapy  3. history of CAD:  - Stable, EKG without acute ST-T wave changes  - Continue aspirin, metoprolol  - troponin x3 negative   4. Hypothyroidism:  - continue Synthroid   Leukocytosis, improving -  No symptoms of urinary tract infection and chest x-ray was more consistent with pulmonary edema than pneumonia -  If fever or worsening dyspnea despite adequate diuresis, will start antibiotics for community-acquired pneumonia.  Need to keep in mind the patient is on warfarin.  Diet:  Healthy heart, 2 g sodium diet Access:  PIV IVF:  Off Proph:  Therapeutic Coumadin  Code Status:DNR  Family Communication:none at bedside  Disposition Plan: inpatient    Consultants:  [facility]  Procedures:  Echo  Antibiotics:  None   HPI/Subjective:  The patient states that she continues to feel somewhat [name] of breath but this is improved since yesterday. She continues to have some cough productive of white phlegm. She denies fevers, chills, nausea, vomiting, diarrhea. She does not the constipated. She thinks the swelling in her ankles has improved somewhat since yesterday. She P. frequently overnight and requests medication to help her sleep this evening.  Objective: Filed Vitals:   10/30/12 1909  10/30/12 2138 10/31/12 0700 10/31/12 1031  BP: 113/57 132/59 125/69 107/64  Pulse: 94 100 102 89  Temp: 97.7 F (36.5 C) 99.3 F (37.4 C) 97.8 F (36.6 C)   TempSrc: Oral Oral Oral   Resp: 20 20 18    Height:      Weight:   61.1 kg (134 lb 11.2 oz)   SpO2: 93% 99% 93%     Intake/Output Summary (Last 24 hours) at 10/31/12 1058 Last data filed at 10/31/12 1014  Gross per 24 hour  Intake   1162 ml  Output   2550 ml  Net  -1388 ml   Filed Weights   10/30/12 1215 10/31/12 0700  Weight: 62.7 kg (138 lb 3.7 oz) 61.1 kg (134 lb 11.2 oz)    Exam:   General:  Caucasian female  No acute distress  HEENT:  NCAT, MMM, JVP to the preauricular area while sitting upright  Cardiovascular:  RRR, nl S1, S2, 3/6 systolic ejection murmur at the right upper sternal border also heard at the left sternal border, , 2+ pulses, warm extremities  Respiratory:  Rales and wheezes heard throughout all lung fields, no rhonchi, no increased WOB  Abdomen:   NABS, soft, NT/ND  MSK:   Normal tone and bulk, trace LEE  Neuro:  Grossly intact  Data Reviewed: Basic Metabolic Panel:  Recent Labs Lab 10/30/12 0304 10/31/12 0405  NA 140 140  K 3.8 3.6  CL 103 101  CO2 24 29  GLUCOSE 137* 107*  BUN 24* 20  CREATININE 1.20* 1.02  CALCIUM 9.6 8.8   Liver Function Tests: No results found for  this basename: AST, ALT, ALKPHOS, BILITOT, PROT, ALBUMIN,  in the last 168 hours No results found for this basename: LIPASE, AMYLASE,  in the last 168 hours No results found for this basename: AMMONIA,  in the last 168 hours CBC:  Recent Labs Lab 10/30/12 0304 10/31/12 0405  WBC 15.8* 15.0*  NEUTROABS 13.7*  --   HGB 12.5 11.5*  HCT 38.3 35.0*  MCV 93.4 90.0  PLT 268 239   Cardiac Enzymes:  Recent Labs Lab 10/30/12 0304 10/30/12 0812  TROPONINI <0.30 <0.30   BNP (last 3 results)  Recent Labs  01/05/12 0350 10/30/12 0304  PROBNP 736.7* 905.6*   CBG: No results found for this basename:  GLUCAP,  in the last 168 hours  No results found for this or any previous visit (from the past 240 hour(s)).   Studies: Dg Chest 2 View  10/30/2012   *RADIOLOGY REPORT*  Clinical Data: Shortness of breath.  CHEST - 2 VIEW  Comparison: Chest radiograph performed 01/05/2012  Findings: The lungs are hyperexpanded, with flattening of the hemidiaphragms, compatible with COPD.  Small bilateral pleural effusions are suspected, slightly more prominent on the right in comparison to the prior study.  Vascular congestion is seen, with minimal bibasilar airspace opacities.  This could reflect mild interstitial edema.  The appearance is less typical for pneumonia. No pneumothorax is identified.  The heart is mildly enlarged.  Calcification is noted within the aortic arch.  No acute osseous abnormalities are seen.  A left shoulder arthroplasty is grossly unremarkable in appearance.  IMPRESSION:  1.  Vascular congestion and mild cardiomegaly, with minimal bibasilar airspace opacities and small bilateral pleural effusions. This could reflect mild interstitial edema.  The appearance is less typical for pneumonia. 2.  Findings of COPD.   Original Report Authenticated By: Tonia Ghent, M.D.    Scheduled Meds: . amiodarone  200 mg Oral Daily  . amLODipine  5 mg Oral Daily  . aspirin EC  81 mg Oral Daily  . atorvastatin  40 mg Oral q1800  . ezetimibe  10 mg Oral Daily  . folic acid  2 mg Oral Daily  . furosemide  40 mg Intravenous Q8H  . hydrALAZINE  50 mg Oral TID  . levothyroxine  88 mcg Oral QAC breakfast  . metoprolol  25 mg Oral BID  . potassium chloride  20 mEq Oral BID  . warfarin  1.25 mg Oral Custom  . warfarin  2.5 mg Oral Custom  . Warfarin - Pharmacist Dosing Inpatient   Does not apply q1800   Continuous Infusions:   Active Problems:   Paroxysmal atrial fibrillation, recurrent   Systolic and diastolic CHF, acute on chronic   Hypothyroidism   Coronary artery disease   Aortic valvular stenosis,  moderate to severe    Time spent: 30 min    Dmitry Macomber, [facility]  [facility] Pager [phone]. If 7PM-7AM, please contact night-coverage at [email or web address], password [facility] 10/31/2012, 10:58 AM  LOS: 1 day

## 2012-10-31 NOTE — Progress Notes (Signed)
Pt c/o SOB on the beginning of the shift, refers relieve after given the Lasix. Frequently rounding given to meet pt's needs. Pt encouraged to call for assistance to get OOB to prevent any fall or injury while in the hospital. Pt resting comfortable on bed, no family member at the bedside. We'll continue with POC.

## 2012-11-01 ENCOUNTER — Inpatient Hospital Stay (HOSPITAL_COMMUNITY): Payer: Medicare Other

## 2012-11-01 DIAGNOSIS — J9600 Acute respiratory failure, unspecified whether with hypoxia or hypercapnia: Secondary | ICD-10-CM

## 2012-11-01 DIAGNOSIS — E876 Hypokalemia: Secondary | ICD-10-CM

## 2012-11-01 DIAGNOSIS — D72829 Elevated white blood cell count, unspecified: Secondary | ICD-10-CM

## 2012-11-01 DIAGNOSIS — I359 Nonrheumatic aortic valve disorder, unspecified: Secondary | ICD-10-CM

## 2012-11-01 DIAGNOSIS — I4891 Unspecified atrial fibrillation: Secondary | ICD-10-CM

## 2012-11-01 DIAGNOSIS — I509 Heart failure, unspecified: Secondary | ICD-10-CM

## 2012-11-01 DIAGNOSIS — J9601 Acute respiratory failure with hypoxia: Secondary | ICD-10-CM | POA: Diagnosis present

## 2012-11-01 DIAGNOSIS — J189 Pneumonia, unspecified organism: Secondary | ICD-10-CM | POA: Diagnosis present

## 2012-11-01 DIAGNOSIS — I959 Hypotension, unspecified: Secondary | ICD-10-CM

## 2012-11-01 DIAGNOSIS — I319 Disease of pericardium, unspecified: Secondary | ICD-10-CM

## 2012-11-01 DIAGNOSIS — I2510 Atherosclerotic heart disease of native coronary artery without angina pectoris: Secondary | ICD-10-CM

## 2012-11-01 LAB — URINALYSIS, ROUTINE W REFLEX MICROSCOPIC
Nitrite: NEGATIVE
Protein, ur: NEGATIVE mg/dL
Specific Gravity, Urine: 1.017 (ref 1.005–1.030)
Urobilinogen, UA: 1 mg/dL (ref 0.0–1.0)

## 2012-11-01 LAB — CBC
HCT: 36.4 % (ref 36.0–46.0)
Hemoglobin: 12.3 g/dL (ref 12.0–15.0)
RDW: 15.6 % — ABNORMAL HIGH (ref 11.5–15.5)
WBC: 17.6 10*3/uL — ABNORMAL HIGH (ref 4.0–10.5)

## 2012-11-01 LAB — PROTIME-INR
INR: 1.98 — ABNORMAL HIGH (ref 0.00–1.49)
Prothrombin Time: 21.7 seconds — ABNORMAL HIGH (ref 11.6–15.2)

## 2012-11-01 LAB — URINE MICROSCOPIC-ADD ON

## 2012-11-01 LAB — BASIC METABOLIC PANEL
CO2: 27 mEq/L (ref 19–32)
Chloride: 101 mEq/L (ref 96–112)
GFR calc Af Amer: 39 mL/min — ABNORMAL LOW (ref >90)
Sodium: 138 mEq/L (ref 135–145)

## 2012-11-01 LAB — PROCALCITONIN: Procalcitonin: 0.49 ng/mL

## 2012-11-01 LAB — PRO B NATRIURETIC PEPTIDE: Pro B Natriuretic peptide (BNP): 3462 pg/mL — ABNORMAL HIGH (ref 0–450)

## 2012-11-01 MED ORDER — LEVOFLOXACIN 750 MG PO TABS
750.0000 mg | ORAL_TABLET | Freq: Once | ORAL | Status: AC
  Administered 2012-11-01: 750 mg via ORAL
  Filled 2012-11-01: qty 1

## 2012-11-01 MED ORDER — DEXTROSE 5 % IV SOLN
1.0000 g | INTRAVENOUS | Status: DC
  Administered 2012-11-01 – 2012-11-05 (×5): 1 g via INTRAVENOUS
  Filled 2012-11-01 (×6): qty 10

## 2012-11-01 MED ORDER — DEXTROSE 5 % IV SOLN: 2.0000 g | INTRAVENOUS | Status: DC

## 2012-11-01 MED ORDER — LEVOFLOXACIN 500 MG PO TABS: 500.0000 mg | ORAL_TABLET | ORAL | Status: DC

## 2012-11-01 MED ORDER — AZITHROMYCIN 500 MG PO TABS
500.0000 mg | ORAL_TABLET | Freq: Every day | ORAL | Status: AC
  Administered 2012-11-01: 500 mg via ORAL
  Filled 2012-11-01: qty 1

## 2012-11-01 MED ORDER — AZITHROMYCIN 250 MG PO TABS
250.0000 mg | ORAL_TABLET | ORAL | Status: AC
  Administered 2012-11-02 – 2012-11-05 (×4): 250 mg via ORAL
  Filled 2012-11-01 (×4): qty 1

## 2012-11-01 NOTE — Progress Notes (Signed)
Pt laying in bed family at the bedside. No concerns at this time. Informed pt plan of care.

## 2012-11-01 NOTE — Progress Notes (Addendum)
[facility] PROGRESS NOTE  [name] ZOX:[id number] DOB: [birth year] DOA: 10/30/2012 PCP: Carollee Herter, MD  Assessment/Plan  Acute and chronic diastolic CHF, primarily from valvular heart disease.  Patient had initial improvement in her dyspnea after diuresis of -1.6 L on day one of admission however yesterday she was only -660 cc and she's had increasing dyspnea.  She was hypotensive overnight.  BNP has risen from admission from 900 to 3462 despite 2 liters diuresis - moderate to severe aortic stenosis on echo 1/13  - Repeat 2-D echocardiogram pending - Hold Lasix while hypotensive - Continue Potassium supplementation  - Follow daily weights, I/Os  - [facility], followed by Dr.Croitoru, to consult  Hypoxic respiratory failure most likely due to combination of acute on chronic diastolic heart failure and possibly some underlying community-acquired pneumonia -  Will treat for both -  Start  Ceftriaxone and azithromycin -  Repeat chest x-ray -  Procalcitonin -  Low threshold for BiPAP.  -  Continuous pulse oximetry  HypoTN -  Discontinue Norvasc and hydralazine -  Hold parameter placed on metoprolol -  Hold diuresis with Lasix until blood pressure recovers  Moderate to severe aortic valve stenosis. Peak gradient of and mean gradient of , AVA of approximately 1cm2 per ECHO 2013 -  Per cardiology  Paroxysmal A. Fib  -  Telemetry: Atrial fibrillation with heart rate in the 90s to low 100s,then SR - Continue metoprolol, Coumadin per pharmacy  -   hold amiodarone per cardiology recommendation  -  INR is therapeutic  Prolonged QTC -  Minimize QTC prolonging medications  history of CAD:  - Stable, EKG without acute ST-T wave changes  - Continue aspirin, metoprolol  - troponin x3 negative   Hypothyroidism:  - continue Synthroid   Leukocytosis, stable.  Concerning for underlying infection. -  Start antibiotics for community-acquired  pneumonia  Hypokalemia, likely due to Lasix -  Continue current potassium supplementation. Anticipate rise in potassium now that stopping Lasix.  Diet:  Healthy heart, 2 g sodium diet Access:  PIV IVF:  Off Proph:  Therapeutic Coumadin  Code Status:DNR  Family Communication:none at bedside  Disposition Plan: inpatient    Consultants:  [facility]  Procedures:  Echo  Antibiotics:  None   HPI/Subjective:  The patient states that she feels worse today. She felt lightheaded when she was standing up to get her hair brush her teeth brushed. She's been coughing more and is now coughing up sputum productive of yellow-brown phlegm. She was placed on nasal cannula overnight. She states that she is making copious urine.  Denies fevers, chills, diarrhea, constipation.  Objective: Filed Vitals:   10/31/12 2042 10/31/12 2224 10/31/12 2227 11/01/12 0640  BP: 98/51 91/50 95/52  95/56  Pulse: 92   85  Temp: 99.5 F (37.5 C)   98.2 F (36.8 C)  TempSrc: Oral   Oral  Resp: 18   18  Height:      Weight:      SpO2: 98%   96%    Intake/Output Summary (Last 24 hours) at 11/01/12 0947 Last data filed at 11/01/12 0800  Gross per 24 hour  Intake   1380 ml  Output   1200 ml  Net    180 ml   Filed Weights   10/30/12 1215 10/31/12 0700  Weight: 62.7 kg (138 lb 3.7 oz) 61.1 kg (134 lb 11.2 oz)    Exam:   General:  Caucasian female  No acute distress, ill  appearing  HEENT:  NCAT, MMM, JVP to the preauricular area while sitting upright  Cardiovascular:  RRR, nl S1, S2, 3/6 systolic ejection murmur at the right upper sternal border also heard at the left sternal border, , 2+ pulses, warm extremities  Respiratory:  Rales and wheezes heard throughout all lung fields, very coarse rales and now very diminished in the left base in particular, + rhonchi, no increased WOB  Abdomen:   NABS, soft, NT/ND  MSK:   Normal tone and bulk, trace LEE  Neuro:  Grossly  intact  Data Reviewed: Basic Metabolic Panel:  Recent Labs Lab 10/30/12 0304 10/31/12 0405 11/01/12 0513  NA 140 140 138  K 3.8 3.6 3.3*  CL 103 101 101  CO2 24 29 27   GLUCOSE 137* 107* 112*  BUN 24* 20 32*  CREATININE 1.20* 1.02 1.38*  CALCIUM 9.6 8.8 9.0   Liver Function Tests: No results found for this basename: AST, ALT, ALKPHOS, BILITOT, PROT, ALBUMIN,  in the last 168 hours No results found for this basename: LIPASE, AMYLASE,  in the last 168 hours No results found for this basename: AMMONIA,  in the last 168 hours CBC:  Recent Labs Lab 10/30/12 0304 10/31/12 0405  WBC 15.8* 15.0*  NEUTROABS 13.7*  --   HGB 12.5 11.5*  HCT 38.3 35.0*  MCV 93.4 90.0  PLT 268 239   Cardiac Enzymes:  Recent Labs Lab 10/30/12 0304 10/30/12 0812  TROPONINI <0.30 <0.30   BNP (last 3 results)  Recent Labs  01/05/12 0350 10/30/12 0304 11/01/12 0513  PROBNP 736.7* 905.6* 3462.0*   CBG: No results found for this basename: GLUCAP,  in the last 168 hours  No results found for this or any previous visit (from the past 240 hour(s)).   Studies: No results found.  Scheduled Meds: . amiodarone  200 mg Oral Daily  . aspirin EC  81 mg Oral Daily  . atorvastatin  40 mg Oral q1800  . ezetimibe  10 mg Oral Daily  . folic acid  2 mg Oral Daily  . levothyroxine  88 mcg Oral QAC breakfast  . metoprolol  25 mg Oral BID  . potassium chloride  20 mEq Oral BID  . warfarin  1.25 mg Oral Custom  . warfarin  2.5 mg Oral Custom  . Warfarin - Pharmacist Dosing Inpatient   Does not apply q1800   Continuous Infusions:   Active Problems:   Paroxysmal atrial fibrillation, recurrent   Systolic and diastolic CHF, acute on chronic   Hypothyroidism   Coronary artery disease   Aortic valvular stenosis, moderate to severe    Time spent: 30 min    Deavion Dobbs, [address] [employer]  [facility] Pager [phone]. If 7PM-7AM, please contact night-coverage at [email or web address], password  [facility] 11/01/2012, 9:47 AM  LOS: 2 days

## 2012-11-01 NOTE — Progress Notes (Signed)
  Echocardiogram 2D Echocardiogram has been performed.  [name] 11/01/2012, 4:14 PM

## 2012-11-01 NOTE — Progress Notes (Signed)
Pt resting comfortable on bed, denies any pain or discomfort, pt no longer A fib on monitor converted to SR. Hospitalist to be notified. We'll continue to monitor.

## 2012-11-01 NOTE — Progress Notes (Signed)
Pt's BP running low during the night hospitalist notified. Order to hold am lasix dose gotten. We'll continue to monitor.

## 2012-11-01 NOTE — Progress Notes (Addendum)
ANTICOAGULATION / ANTIBIOTIC CONSULT NOTE - Follow Up Consult  Pharmacy Consult for Coumadin / Levaquin Indication: atrial fibrillation / CAP  Allergies  Allergen Reactions  . Sulfa Antibiotics Itching and Rash    Patient Measurements: Height: 5\' 2"  (157.5 cm) Weight: 134 lb 11.2 oz (61.1 kg) IBW/kg (Calculated) : 50.1  Vital Signs: Temp: 98.2 F (36.8 C) (06/08 0640) Temp src: Oral (06/08 0640) BP: 95/56 mmHg (06/08 0640) Pulse Rate: 85 (06/08 0640)  Labs:  Recent Labs  10/30/12 0304 10/30/12 0812 10/31/12 0405 11/01/12 0513  HGB 12.5  --  11.5*  --   HCT 38.3  --  35.0*  --   PLT 268  --  239  --   LABPROT  --  23.3* 25.5* 21.7*  INR  --  2.18* 2.46* 1.98*  CREATININE 1.20*  --  1.02 1.38*  TROPONINI <0.30 <0.30  --   --     Estimated Creatinine Clearance: 24.7 ml/min (by C-G formula based on Cr of 1.38).   Medications:  Scheduled:  . amiodarone  200 mg Oral Daily  . amLODipine  5 mg Oral Daily  . aspirin EC  81 mg Oral Daily  . atorvastatin  40 mg Oral q1800  . ezetimibe  10 mg Oral Daily  . folic acid  2 mg Oral Daily  . levothyroxine  88 mcg Oral QAC breakfast  . metoprolol  25 mg Oral BID  . potassium chloride  20 mEq Oral BID  . warfarin  1.25 mg Oral Custom  . warfarin  2.5 mg Oral Custom  . Warfarin - Pharmacist Dosing Inpatient   Does not apply q1800    Assessment: 89 yof on chronic Coumadin for history of afib to continue her Coumadin while admitted. Her INR is slightly subtherapeutic at 1.98 (from 2.46 yesterday). Will not alter home dose yet, as patient is scheduled to get her higher dose of Coumadin tonight anyway. H/H slightly low, plts are wnl.  Patient also to start Levaquin per pharmacy for community-acquired pneumonia. WBC elevated at 17.6. Tmax 99.5. SCr 1.38, CrCl estimated at ~25 ml/min.   Goal of Therapy:  INR 2-3   Plan:  1. Continue home regimen of Coumadin 2.5mg  on Sat + Sun, Tues-Thurs and 1.25mg  on Monday and Friday.  2.  Daily INR. 3. Levaquin 750mg  po x1, then 500 mg q48 hrs. 4. Follow up renal function, cultures, clinical improvement, LOT  Lillia Pauls, PharmD Clinical Pharmacist Pager: [phone] Phone: [phone] 11/01/2012 8:17 AM

## 2012-11-01 NOTE — Progress Notes (Signed)
Report received from [place], [place]. Pt laying in bed comfortably no concerns or requests at this time. O2 in place 2l [place].

## 2012-11-01 NOTE — Consult Note (Signed)
Reason for Consult: CHF Referring Physician: TRH   HPI: The patient is a 79 y/o female, previously followed at [facility] by Dr. Clarene Duke and now by Dr. Royann Shivers.  She has knonw coronary artery disease and history of percutaneous revascularization of the right coronary artery (3.0-x-18-mm nondrug-eluting Integrity stent placed in 2010), recurrent paroxysmal atrial fibrillation, requiring cardioversion twice in the last 2 years, moderate aortic stenosis, hyperlipidemia, hypertension, and treated hypothyroidism. She initially presented to [facility] on 10/30/12 with a complaint of acute onset of dyspnea with wheezing, and a persistent dry cough. Work-up revealed acute on chronic CHF. Her BNP was 900 and her CXR demonstrated pulmonary vascular congestion. She was admitted by [facility] and was administered IV Lasix. She initially had improvement in her dyspnea after diuresing a total of 1.6L. However, after day one of her hospitalization, she noted worsening dyspnea and her diuresis slowed to only -600 ml in a 24 hr period. Her BNP increased from 900 to 3,462, despite diuresis. The patient is now hypotensive. [facility] has been consulted to assist in her treatment. The patient continues to endorse resting shortness of breath that is worse now than when she was first admitted. She continues to have a cough with some sputum production. She denies chest pain, orthopnea, PND, LEE or weight gain. She states that she was a little lightheaded earlier today but denies syncope/presyncope.  No fever, chills, n/v, melena, hemamuria or hematochezia.  She states that she has been compliant with all of her medications and denies a recent diet high in sodium.   Past Medical History  Diagnosis Date  . CHF (congestive heart failure)   . Hypothyroidism   . Dyslipidemia   . Hypertension   . PAF (paroxysmal atrial fibrillation)   .  Acute respiratiory failure requiring BiPap 06/24/2011  . Aortic valvular stenosis, moderate to severe 06/24/2011  . Atrial  fibrillation   . Allergy   . Heart murmur   . Coronary artery disease   . Exertional shortness of breath   . Arthritis     "back" (10/30/2012)    Past Surgical History  Procedure Laterality Date  . Cardioversion  06/19/2011    Procedure: CARDIOVERSION;  Surgeon: Thurmon Fair, MD;  Location: [facility] OR;  Service: Cardiovascular;  Laterality: N/A;  . Appendectomy    . Angioplasty    . Cataract extraction w/ intraocular lens  implant, bilateral    . Lumbar disc surgery      "had to go in twice and clean out scar tissue" (10/30/2012)  . Cardiac catheterization    . Coronary angioplasty with stent placement      "1" (10/30/2012)  . Dilation and curettage of uterus    . Excisional hemorrhoidectomy    . Wrist fracture surgery Left   . Shoulder acromioplasty Left     "fell; put a new ball in" (10/30/2012)    Family History  Problem Relation Age of Onset  . Heart disease Brother   . Heart disease Brother   . Heart disease Mother   . Cancer Mother     uterus  . Heart disease Father     Social History:  reports that she has never smoked. She has never used smokeless tobacco. She reports that she does not drink alcohol or use illicit drugs.  Allergies:  Allergies  Allergen Reactions  . Sulfa Antibiotics Itching and Rash    Medications:  Prior to Admission medications   Medication Sig Start Date End Date Taking? Authorizing Provider  acetaminophen (TYLENOL)  650 MG CR tablet Take 650 mg by mouth 3 (three) times daily.   Yes Historical Provider, MD  Amiodarone HCl (PACERONE PO) Take 1 tablet by mouth daily.   Yes Historical Provider, MD  amLODipine (NORVASC) 5 MG tablet Take 5 mg by mouth daily.   Yes Historical Provider, MD  aspirin EC 81 MG tablet Take 81 mg by mouth daily.   Yes Historical Provider, MD  atorvastatin (LIPITOR) 40 MG tablet Take 40 mg by mouth daily.    Yes Historical Provider, MD  ezetimibe (ZETIA) 10 MG tablet Take 10 mg by mouth daily.     Yes Historical Provider, MD   folic acid (FOLVITE) 1 MG tablet Take 2 mg by mouth daily.    Yes Historical Provider, MD  furosemide (LASIX) 40 MG tablet Take 40 mg by mouth daily.    Yes Historical Provider, MD  hydrALAZINE (APRESOLINE) 50 MG tablet Take 50 mg by mouth 3 (three) times daily.    Yes Historical Provider, MD  levothyroxine (SYNTHROID, LEVOTHROID) 88 MCG tablet Take 88 mcg by mouth daily.    Yes Historical Provider, MD  Losartan Potassium (COZAAR PO) Take 1 tablet by mouth daily.   Yes Historical Provider, MD  metoprolol (LOPRESSOR) 50 MG tablet Take 25 mg by mouth 2 (two) times daily. 06/25/11  Yes Abelino Derrick, PA-C  Multiple Vitamins-Minerals (CENTRUM SILVER) tablet Take 1 tablet by mouth daily.     Yes Historical Provider, MD  warfarin (COUMADIN) 5 MG tablet Take 1.25-2.5 mg by mouth daily. Takes 2.5 mg (1/2 tab) on Sun tues, wed ,thurs and sat  And1.25 mg ( 1/4 tab) on Mon and friday   Yes Historical Provider, MD     Results for orders placed during the hospital encounter of 10/30/12 (from the past 48 hour(s))  CBC     Status: Abnormal   Collection Time    10/31/12  4:05 AM      Result Value Range   WBC 15.0 (*) 4.0 - 10.5 K/uL   RBC 3.89  3.87 - 5.11 MIL/uL   Hemoglobin 11.5 (*) 12.0 - 15.0 g/dL   HCT 40.9 (*) 81.1 - 91.4 %   MCV 90.0  78.0 - 100.0 fL   MCH 29.6  26.0 - 34.0 pg   MCHC 32.9  30.0 - 36.0 g/dL   RDW 78.2 (*) 95.6 - 21.3 %   Platelets 239  150 - 400 K/uL  BASIC METABOLIC PANEL     Status: Abnormal   Collection Time    10/31/12  4:05 AM      Result Value Range   Sodium 140  135 - 145 mEq/L   Potassium 3.6  3.5 - 5.1 mEq/L   Chloride 101  96 - 112 mEq/L   CO2 29  19 - 32 mEq/L   Glucose, Bld 107 (*) 70 - 99 mg/dL   BUN 20  6 - 23 mg/dL   Creatinine, Ser 0.86  0.50 - 1.10 mg/dL   Calcium 8.8  8.4 - 57.8 mg/dL   GFR calc non Af Amer 48 (*) >90 mL/min   GFR calc Af Amer 56 (*) >90 mL/min   Comment:            The eGFR has been calculated     using the CKD EPI equation.      This calculation has not been     validated in all clinical     situations.     eGFR's persistently     <  90 mL/min signify     possible Chronic Kidney Disease.  PROTIME-INR     Status: Abnormal   Collection Time    10/31/12  4:05 AM      Result Value Range   Prothrombin Time 25.5 (*) 11.6 - 15.2 seconds   INR 2.46 (*) 0.00 - 1.49  TSH     Status: None   Collection Time    10/31/12 11:25 AM      Result Value Range   TSH 1.046  0.350 - 4.500 uIU/mL  PROTIME-INR     Status: Abnormal   Collection Time    11/01/12  5:13 AM      Result Value Range   Prothrombin Time 21.7 (*) 11.6 - 15.2 seconds   INR 1.98 (*) 0.00 - 1.49  BASIC METABOLIC PANEL     Status: Abnormal   Collection Time    11/01/12  5:13 AM      Result Value Range   Sodium 138  135 - 145 mEq/L   Potassium 3.3 (*) 3.5 - 5.1 mEq/L   Chloride 101  96 - 112 mEq/L   CO2 27  19 - 32 mEq/L   Glucose, Bld 112 (*) 70 - 99 mg/dL   BUN 32 (*) 6 - 23 mg/dL   Comment: DELTA CHECK NOTED   Creatinine, Ser 1.38 (*) 0.50 - 1.10 mg/dL   Calcium 9.0  8.4 - 16.1 mg/dL   GFR calc non Af Amer 33 (*) >90 mL/min   GFR calc Af Amer 39 (*) >90 mL/min   Comment:            The eGFR has been calculated     using the CKD EPI equation.     This calculation has not been     validated in all clinical     situations.     eGFR's persistently     <90 mL/min signify     possible Chronic Kidney Disease.  PRO B NATRIURETIC PEPTIDE     Status: Abnormal   Collection Time    11/01/12  5:13 AM      Result Value Range   Pro B Natriuretic peptide (BNP) 3462.0 (*) 0 - 450 pg/mL  CBC     Status: Abnormal   Collection Time    11/01/12 10:34 AM      Result Value Range   WBC 17.6 (*) 4.0 - 10.5 K/uL   RBC 4.03  3.87 - 5.11 MIL/uL   Hemoglobin 12.3  12.0 - 15.0 g/dL   HCT 09.6  04.5 - 40.9 %   MCV 90.3  78.0 - 100.0 fL   MCH 30.5  26.0 - 34.0 pg   MCHC 33.8  30.0 - 36.0 g/dL   RDW 81.1 (*) 91.4 - 78.2 %   Platelets 225  150 - 400 K/uL    Dg  Chest 2 View  11/01/2012   *RADIOLOGY REPORT*  Clinical Data: Increasing dyspnea.  Rales.  Productive sputum.  CHEST - 2 VIEW  Comparison: Chest x-ray 10/30/2012.  Findings: Hyperexpansion with flattening of the hemidiaphragms and increased retrosternal air space.  Small right pleural effusion. No definite acute consolidative airspace disease.  No evidence of pulmonary edema.  Heart size is normal.  Mild diffuse bronchial wall thickening appears be chronic and is similar to prior studies. Upper mediastinal contours are unremarkable.  Atherosclerosis of the thoracic aorta.  Bilateral apical pleuroparenchymal thickening, similar to prior studies, most compatible with chronic scarring. Status post left shoulder  arthroplasty.  IMPRESSION: 1.  Small right pleural effusion. 2.  Chronic changes of COPD redemonstrated. 3.  Atherosclerosis.   Original Report Authenticated By: Trudie Reed, M.D.    Review of Systems  Constitutional: Negative for fever, chills, malaise/fatigue and diaphoresis.  HENT: Positive for congestion. Negative for ear pain.   Eyes: Negative for blurred vision and double vision.  Respiratory: Positive for cough, sputum production, shortness of breath, wheezing and stridor. Negative for hemoptysis.   Cardiovascular: Positive for palpitations. Negative for chest pain, orthopnea, claudication, leg swelling and PND.  Gastrointestinal: Negative for nausea, vomiting, abdominal pain, diarrhea, constipation, blood in stool and melena.  Genitourinary: Negative for dysuria, urgency, frequency and hematuria.  Musculoskeletal: Negative for myalgias and falls.  Skin: Negative for itching and rash.  Neurological: Positive for dizziness. Negative for focal weakness, loss of consciousness, weakness and headaches.   Blood pressure 95/56, pulse 85, temperature 98.2 F (36.8 C), temperature source Oral, resp. rate 18, height 5\' 2"  (1.575 m), weight 134 lb 11.2 oz (61.1 kg), SpO2 96.00%. Physical Exam   Constitutional: She appears well-developed and well-nourished. No distress.  HENT:  Head: Normocephalic and atraumatic.  Eyes: EOM are normal. Pupils are equal, round, and reactive to light.  Neck: No JVD present. Carotid bruit is not present. No tracheal deviation present. No thyromegaly present.  Cardiovascular: Normal rate, regular rhythm and intact distal pulses.  Exam reveals no gallop and no friction rub.   Murmur heard.  Systolic murmur is present with a grade of 2/6  Pulses:      Radial pulses are 2+ on the right side, and 2+ on the left side.       Dorsalis pedis pulses are 2+ on the right side, and 2+ on the left side.  Respiratory: Effort normal. No respiratory distress. She has wheezes (bilateral, diffuse expiratory wheezing). She has rales (bibasilar). She exhibits no tenderness.  GI: Soft. Bowel sounds are normal. She exhibits no distension and no mass. There is no tenderness.  Musculoskeletal: She exhibits edema (trace bilateral).  Lymphadenopathy:    She has no cervical adenopathy.  Skin: Skin is warm and dry. She is not diaphoretic.  Psychiatric: She has a normal mood and affect. Her behavior is normal.    Assessment/Plan: Principal Problem:   Systolic and diastolic CHF, acute on chronic Active Problems:   Paroxysmal atrial fibrillation, recurrent   Hypothyroidism   Coronary artery disease   Aortic valvular stenosis, moderate to severe   Hypotension, unspecified   Hypokalemia   Acute respiratory failure with hypoxia   Leukocytosis, unspecified   CAP (community acquired pneumonia)  Plan: 79 y/o female with known CAD, moderate-sever AS and PAF, admitted 2 days ago for A/C CHF. Admission BNP was 905.6. CXR on admission demonstrated vascular congestion and mild cardiomegaly, with minimal bibasilar airspace opacities and small bilateral pleural effusions. She had initial improvement in her symptoms after administration of IV Lasix and she had good diuresis. However,  the patient now endorses worsening symptoms and her BNP has increased to 3K. F/U CXR today shows a small right pleural effusion and no evidence of pulmonary edema. ? If pt has CAP as well. She is on Abx. Due to hypotension, with SBP in the 90s, I agree with temporarily holding Lasix. Need to use caution due to AS. Replete K+ and continue to monitor BP and renal function.   Allayne Butcher, PA-C 11/01/2012, 11:33 AM   I have seen and examined the patient along with BRITTAINY M.  SIMMONS, PA-C.  I have reviewed the chart, notes and new data.  I agree with PA's note.  Key new complaints: cough is now productive, but she cannot bring sputum out; no chest pain -either pleuritic or anginal Key examination changes: wheezing, no JVD, no S3 , no edema Key new findings / data: increasing BNP, but CXR which had shown some congestion and small effusions is now very clear; WBC is high and rising ECG with long QT due to amiodarone Rx.  PLAN: No overt signs of hypervolemia clinically, although BNP rising - suspect this is a primary pulmonary process. Agree with broad antibiotic coverage. Note likelihood that levofloxacin will prolong QT further Hold amiodarone due to long QT, especially until the etiology of her lung problem is clarified. No evidence of acute coronary insufficiency by history, ECG and labs. Will reassess after reviewing her echo.  Thurmon Fair, MD, [facility] [phone] 11/01/2012, 11:59 AM

## 2012-11-02 DIAGNOSIS — J189 Pneumonia, unspecified organism: Secondary | ICD-10-CM

## 2012-11-02 DIAGNOSIS — R9431 Abnormal electrocardiogram [ECG] [EKG]: Secondary | ICD-10-CM | POA: Diagnosis present

## 2012-11-02 LAB — BASIC METABOLIC PANEL
BUN: 30 mg/dL — ABNORMAL HIGH (ref 6–23)
Chloride: 101 mEq/L (ref 96–112)
Creatinine, Ser: 1.07 mg/dL (ref 0.50–1.10)
GFR calc Af Amer: 53 mL/min — ABNORMAL LOW (ref >90)
GFR calc non Af Amer: 45 mL/min — ABNORMAL LOW (ref >90)
Potassium: 4.1 mEq/L (ref 3.5–5.1)

## 2012-11-02 LAB — CBC
HCT: 34.1 % — ABNORMAL LOW (ref 36.0–46.0)
MCHC: 32 g/dL (ref 30.0–36.0)
Platelets: 226 10*3/uL (ref 150–400)
RDW: 15.4 % (ref 11.5–15.5)
WBC: 11.3 10*3/uL — ABNORMAL HIGH (ref 4.0–10.5)

## 2012-11-02 LAB — PROTIME-INR
INR: 1.83 — ABNORMAL HIGH (ref 0.00–1.49)
Prothrombin Time: 20.5 seconds — ABNORMAL HIGH (ref 11.6–15.2)

## 2012-11-02 MED ORDER — POTASSIUM CHLORIDE CRYS ER 20 MEQ PO TBCR
20.0000 meq | EXTENDED_RELEASE_TABLET | Freq: Every day | ORAL | Status: DC
  Administered 2012-11-02 – 2012-11-03 (×2): 20 meq via ORAL
  Filled 2012-11-02: qty 1

## 2012-11-02 MED ORDER — POLYETHYLENE GLYCOL 3350 17 G PO PACK
17.0000 g | PACK | Freq: Every day | ORAL | Status: DC | PRN
  Filled 2012-11-02 (×2): qty 1

## 2012-11-02 MED ORDER — GUAIFENESIN-DM 100-10 MG/5ML PO SYRP
5.0000 mL | ORAL_SOLUTION | ORAL | Status: DC | PRN
  Administered 2012-11-03 – 2012-11-05 (×4): 5 mL via ORAL
  Filled 2012-11-02 (×4): qty 5

## 2012-11-02 MED ORDER — BENZONATATE 100 MG PO CAPS
100.0000 mg | ORAL_CAPSULE | Freq: Three times a day (TID) | ORAL | Status: DC
  Administered 2012-11-02 – 2012-11-06 (×13): 100 mg via ORAL
  Filled 2012-11-02 (×15): qty 1

## 2012-11-02 MED ORDER — WARFARIN SODIUM 4 MG PO TABS
4.0000 mg | ORAL_TABLET | Freq: Once | ORAL | Status: AC
  Administered 2012-11-02: 4 mg via ORAL
  Filled 2012-11-02: qty 1

## 2012-11-02 NOTE — Progress Notes (Addendum)
[facility] PROGRESS NOTE  SUGEY TREVATHAN ZOX:[id number] DOB: [birth year] DOA: 10/30/2012 PCP: Carollee Herter, MD  Assessment/Plan  Hypoxic respiratory failure most likely due to combination of acute on chronic diastolic heart failure and underlying community-acquired pneumonia.  Acute and chronic diastolic CHF, primarily from valvular heart disease.  Patient had initial improvement in her dyspnea after diuresis of -1.6 L however she had increasing dyspnea and her BNP rose. She became mildly hypotensive. Repeat chest x-ray did not demonstrate any new infiltrates, however her WBC count and her procalcitonin were elevated.   - moderate to severe aortic stenosis on echo 1/13  - Repeat 2-D echocardiogram pending - Continue to hold Lasix due to borderline low blood pressures - Continue Potassium supplementation  - Follow daily weights, I/Os  - Appreciate [facility] recommendations, followed by Dr.Croitoru -  Wean oxygen as tolerated -  Once on room air will need walking pulse ox  Community-acquired pneumonia, patient feeling improved today -  Day 2 of Ceftriaxone and azithromycin -  Start tessalon and robitussin  HypoTN, blood pressure is still mildly low -  Continue to hold Norvasc and hydralazine -  Hold parameter placed on metoprolol -  Continue to hold Lasix until blood pressure recovers  Moderate to severe aortic valve stenosis. Peak gradient of and mean gradient of , AVA of approximately 1cm2 per ECHO 2013 -  Per cardiology  Paroxysmal A. Fib -  Telemetry: Initially atrial fibrillation with heart rate in the 90s to low 100s, then SR for the last two days -  Continue metoprolol, Coumadin per pharmacy  -  hold amiodarone per cardiology recommendation  -  INR is mildly subtherapeutic  Prolonged QTC -  Minimize QTC prolonging medications  history of CAD:  - Stable, EKG without acute ST-T wave changes  - Continue aspirin, metoprolol  - troponin x3 negative    Hypothyroidism: TSH 1.046 - continue Synthroid   Leukocytosis, improving after initiation of antibiotics -  Continue treatment for community-acquired pneumonia  Hypokalemia, likely due to Lasix, resolved - Decrease potassium to 20 mEq once daily  Constipation -  Start when necessary MiraLax  Diet:  Healthy heart, 2 g sodium diet Access:  PIV IVF:  Off Proph: Coumadin  Code Status:DNR  Family Communication:none at bedside  Disposition Plan: Continue telemetry    Consultants:  [facility]  Procedures:  Echo  Antibiotics:  None   HPI/Subjective:  The patient states that she feels better today. She still has cough productive of yellow-green sputum. She denies fevers chills, nausea, vomiting. She has not had a bowel movement in several days.    Objective: Filed Vitals:   11/01/12 0640 11/01/12 1030 11/01/12 2108 11/02/12 0434  BP: 95/56 119/66 108/61 105/71  Pulse: 85 92 82 78  Temp: 98.2 F (36.8 C)  99.6 F (37.6 C) 98.4 F (36.9 C)  TempSrc: Oral  Oral Oral  Resp: 18  18 16   Height:      Weight:    61.7 kg (136 lb 0.4 oz)  SpO2: 96% 95% 96% 97%    Intake/Output Summary (Last 24 hours) at 11/02/12 0805 Last data filed at 11/02/12 0548  Gross per 24 hour  Intake    698 ml  Output   1725 ml  Net  -1027 ml   Filed Weights   10/30/12 1215 10/31/12 0700 11/02/12 0434  Weight: 62.7 kg (138 lb 3.7 oz) 61.1 kg (134 lb 11.2 oz) 61.7 kg (136 lb 0.4 oz)  Exam:   General:  Caucasian female  No acute distress  HEENT:  NCAT, MMM  Cardiovascular:  RRR, nl S1, S2, 3/6 systolic ejection murmur at the right upper sternal border also heard at the left sternal border, , 2+ pulses, warm extremities  Respiratory:  Decreased wheeze today, persistent Rales heard throughout all lung fields more on the left than the right, + rhonchi, no increased WOB  Abdomen:   NABS, soft, NT/ND  MSK:   Normal tone and bulk, 1+ LEE  Neuro:  Grossly  intact  Data Reviewed: Basic Metabolic Panel:  Recent Labs Lab 10/30/12 0304 10/31/12 0405 11/01/12 0513 11/02/12 0340  NA 140 140 138 137  K 3.8 3.6 3.3* 4.1  CL 103 101 101 101  CO2 24 29 27 26   GLUCOSE 137* 107* 112* 94  BUN 24* 20 32* 30*  CREATININE 1.20* 1.02 1.38* 1.07  CALCIUM 9.6 8.8 9.0 8.8   Liver Function Tests: No results found for this basename: AST, ALT, ALKPHOS, BILITOT, PROT, ALBUMIN,  in the last 168 hours No results found for this basename: LIPASE, AMYLASE,  in the last 168 hours No results found for this basename: AMMONIA,  in the last 168 hours CBC:  Recent Labs Lab 10/30/12 0304 10/31/12 0405 11/01/12 1034 11/02/12 0340  WBC 15.8* 15.0* 17.6* 11.3*  NEUTROABS 13.7*  --   --   --   HGB 12.5 11.5* 12.3 10.9*  HCT 38.3 35.0* 36.4 34.1*  MCV 93.4 90.0 90.3 90.5  PLT 268 239 225 226   Cardiac Enzymes:  Recent Labs Lab 10/30/12 0304 10/30/12 0812  TROPONINI <0.30 <0.30   BNP (last 3 results)  Recent Labs  01/05/12 0350 10/30/12 0304 11/01/12 0513  PROBNP 736.7* 905.6* 3462.0*   CBG: No results found for this basename: GLUCAP,  in the last 168 hours  No results found for this or any previous visit (from the past 240 hour(s)).   Studies: Dg Chest 2 View  11/01/2012   *RADIOLOGY REPORT*  Clinical Data: Increasing dyspnea.  Rales.  Productive sputum.  CHEST - 2 VIEW  Comparison: Chest x-ray 10/30/2012.  Findings: Hyperexpansion with flattening of the hemidiaphragms and increased retrosternal air space.  Small right pleural effusion. No definite acute consolidative airspace disease.  No evidence of pulmonary edema.  Heart size is normal.  Mild diffuse bronchial wall thickening appears be chronic and is similar to prior studies. Upper mediastinal contours are unremarkable.  Atherosclerosis of the thoracic aorta.  Bilateral apical pleuroparenchymal thickening, similar to prior studies, most compatible with chronic scarring. Status post left  shoulder arthroplasty.  IMPRESSION: 1.  Small right pleural effusion. 2.  Chronic changes of COPD redemonstrated. 3.  Atherosclerosis.   Original Report Authenticated By: Trudie Reed, M.D.    Scheduled Meds: . aspirin EC  81 mg Oral Daily  . atorvastatin  40 mg Oral q1800  . azithromycin  250 mg Oral Q24H  . cefTRIAXone (ROCEPHIN)  IV  1 g Intravenous Q24H  . ezetimibe  10 mg Oral Daily  . folic acid  2 mg Oral Daily  . levothyroxine  88 mcg Oral QAC breakfast  . metoprolol  25 mg Oral BID  . potassium chloride  20 mEq Oral BID  . warfarin  1.25 mg Oral Custom  . warfarin  2.5 mg Oral Custom  . Warfarin - Pharmacist Dosing Inpatient   Does not apply q1800   Continuous Infusions:   Principal Problem:   Systolic and diastolic CHF, acute  on chronic Active Problems:   Paroxysmal atrial fibrillation, recurrent   Hypothyroidism   Coronary artery disease   Aortic valvular stenosis, moderate to severe   Hypotension, unspecified   Hypokalemia   Acute respiratory failure with hypoxia   Leukocytosis, unspecified   CAP (community acquired pneumonia)    Time spent: 30 min    Aurorah Schlachter, [facility]  [facility] Pager [phone]. If 7PM-7AM, please contact night-coverage at [email or web address], password [facility] 11/02/2012, 8:05 AM  LOS: 3 days

## 2012-11-02 NOTE — Progress Notes (Signed)
[facility]  Subjective: Pt states she is feeling better today. She still has a productive cough and some shortness of breath.  Objective: Vital signs in last 24 hours: Temp:  [97.1 F (36.2 C)-99.6 F (37.6 C)] 97.1 F (36.2 C) (06/09 0746) Pulse Rate:  [72-82] 82 (06/09 1033) Resp:  [16-20] 20 (06/09 0746) BP: (105-117)/(55-71) 117/55 mmHg (06/09 1033) SpO2:  [96 %-97 %] 96 % (06/09 0746) Weight:  [136 lb 0.4 oz (61.7 kg)] 136 lb 0.4 oz (61.7 kg) (06/09 0434) Last BM Date: 10/30/12  Intake/Output from previous day: 06/08 0701 - 06/09 0700 In: 938 [P.O.:938] Out: 1725 [Urine:1725] Intake/Output this shift: Total I/O In: 240 [P.O.:240] Out: 250 [Urine:250]  Medications Current Facility-Administered Medications  Medication Dose Route Frequency Provider Last Rate Last Dose  . acetaminophen (TYLENOL) tablet 650 mg  650 mg Oral Q6H PRN Zannie Cove, MD      . aspirin EC tablet 81 mg  81 mg Oral Daily Zannie Cove, MD   81 mg at 11/02/12 1037  . atorvastatin (LIPITOR) tablet 40 mg  40 mg Oral q1800 Zannie Cove, MD   40 mg at 11/01/12 1702  . azithromycin (ZITHROMAX) tablet 250 mg  250 mg Oral Q24H Renae Fickle, MD      . benzonatate (TESSALON) capsule 100 mg  100 mg Oral TID Renae Fickle, MD   100 mg at 11/02/12 1037  . cefTRIAXone (ROCEPHIN) 1 g in dextrose 5 % 50 mL IVPB  1 g Intravenous Q24H Renae Fickle, MD   1 g at 11/01/12 1848  . ezetimibe (ZETIA) tablet 10 mg  10 mg Oral Daily Zannie Cove, MD   10 mg at 11/02/12 1038  . folic acid (FOLVITE) tablet 2 mg  2 mg Oral Daily Zannie Cove, MD   2 mg at 11/02/12 1037  . guaiFENesin-dextromethorphan (ROBITUSSIN DM) 100-10 MG/5ML syrup 5 mL  5 mL Oral Q4H PRN Renae Fickle, MD      . levothyroxine (SYNTHROID, LEVOTHROID) tablet 88 mcg  88 mcg Oral QAC breakfast Zannie Cove, MD   88 mcg at 11/02/12 0547  . metoprolol tartrate (LOPRESSOR) tablet 25 mg  25 mg Oral BID Renae Fickle, MD   25 mg at 11/02/12 1033  . polyethylene glycol (MIRALAX / GLYCOLAX) packet 17 g  17 g Oral Daily PRN Renae Fickle, MD      . potassium chloride SA (K-DUR,KLOR-CON) CR tablet 20 mEq  20 mEq Oral Daily Renae Fickle, MD   20 mEq at 11/02/12 1037  . warfarin (COUMADIN) tablet 4 mg  4 mg Oral ONCE-1800 Renae Fickle, MD      . Warfarin - Pharmacist Dosing Inpatient   Does not apply q1800 Drake Leach Rumbarger, RPH      . zolpidem (AMBIEN) tablet 5 mg  5 mg Oral QHS PRN Renae Fickle, MD        PE: General appearance: alert, cooperative and no distress Lungs: diffuse bilateral wheezing and rhonchi Heart: regular rate and rhythm Extremities: no LEE Pulses: 2+ and symmetric Skin: warm and dry Neurologic: Grossly normal  Lab Results:   Recent Labs  10/31/12 0405 11/01/12 1034 11/02/12 0340  WBC 15.0* 17.6* 11.3*  HGB 11.5* 12.3 10.9*  HCT 35.0* 36.4 34.1*  PLT 239 225 226   BMET  Recent Labs  10/31/12 0405 11/01/12 0513 11/02/12 0340  NA 140 138 137  K 3.6 3.3* 4.1  CL 101 101 101  CO2 29 27 26  GLUCOSE 107* 112* 94  BUN 20 32* 30*  CREATININE 1.02 1.38* 1.07  CALCIUM 8.8 9.0 8.8   PT/INR  Recent Labs  10/31/12 0405 11/01/12 0513 11/02/12 0340  LABPROT 25.5* 21.7* 20.5*  INR 2.46* 1.98* 1.83*    Assessment/Plan   Principal Problem:   Systolic and diastolic CHF, acute on chronic Active Problems:   Paroxysmal atrial fibrillation, recurrent   Hypothyroidism   Coronary artery disease   Aortic valvular stenosis, moderate to severe   Hypotension, unspecified   Hypokalemia   Acute respiratory failure with hypoxia   Leukocytosis, unspecified   CAP (community acquired pneumonia)  Plan: Amiodarone was discontinued yesterday due to prolonged QT and current pulmonary status. Levaquin has also been discontinued. Pt now receiving azithromycin and ceftriaxone for suspected CAP. Pt endorses subjective improvement. Still no signs of volume overload on  exam. 2D echo was performed yesterday. Report pending. I suspect that this is mostly pulmonary and non-cardiac. However, MD will provide further recommendation once echo is read.    LOS: 3 days    Brittainy M. Sharol Harness, PA-C 11/02/2012 11:19 AM  I have seen and examined the patient along with Brittainy M. Sharol Harness, PA-C.  I have reviewed the chart, notes and new data.  I agree with PA's note.  Key new complaints: Slightly improved Key examination changes: Still has a wet sounding cough Key new findings / data: Her echo actually does show signs of elevated mean left atrial pressure consistent with decompensated congestive heart failure Note subtherapeutic INR Improved white blood cell count, still abnormal  PLAN: I don't think that heart failure can explain the entire clinical picture. Continue treatment with antibiotics and also slow and gradual diuresis.  Thurmon Fair, MD, [facility] [phone] 11/02/2012, 4:04 PM

## 2012-11-02 NOTE — Progress Notes (Signed)
ANTICOAGULATION CONSULT NOTE - Follow Up Consult  Pharmacy Consult for Coumadin Indication: atrial fibrillation  Allergies  Allergen Reactions  . Sulfa Antibiotics Itching and Rash    Patient Measurements: Height: 5\' 2"  (157.5 cm) Weight: 136 lb 0.4 oz (61.7 kg) IBW/kg (Calculated) : 50.1  Vital Signs: Temp: 98.4 F (36.9 C) (06/09 0434) Temp src: Oral (06/09 0434) BP: 105/71 mmHg (06/09 0434) Pulse Rate: 78 (06/09 0434)  Labs:  Recent Labs  10/31/12 0405 11/01/12 0513 11/01/12 1034 11/02/12 0340  HGB 11.5*  --  12.3 10.9*  HCT 35.0*  --  36.4 34.1*  PLT 239  --  225 226  LABPROT 25.5* 21.7*  --  20.5*  INR 2.46* 1.98*  --  1.83*  CREATININE 1.02 1.38*  --  1.07    Estimated Creatinine Clearance: 32 ml/min (by C-G formula based on Cr of 1.07).   Medications:  Scheduled:  . aspirin EC  81 mg Oral Daily  . atorvastatin  40 mg Oral q1800  . azithromycin  250 mg Oral Q24H  . benzonatate  100 mg Oral TID  . cefTRIAXone (ROCEPHIN)  IV  1 g Intravenous Q24H  . ezetimibe  10 mg Oral Daily  . folic acid  2 mg Oral Daily  . levothyroxine  88 mcg Oral QAC breakfast  . metoprolol  25 mg Oral BID  . potassium chloride  20 mEq Oral Daily  . warfarin  1.25 mg Oral Custom  . warfarin  2.5 mg Oral Custom  . Warfarin - Pharmacist Dosing Inpatient   Does not apply q1800    Assessment: 79 y/o female patient on chronic Coumadin for history of afib. INR remains subtherapeutic and trending down. This is related to amiodarone being held as this drug interaction increase the INR. Will need to give larger coumadin dose while off of amiodarone.  Goal of Therapy:  INR 2-3   Plan:  Coumadin 4mg  today and f/u daily protime.  Verlene Mayer, PharmD, BCPS Pager [phone]  11/02/2012 8:49 AM

## 2012-11-03 DIAGNOSIS — Z7901 Long term (current) use of anticoagulants: Secondary | ICD-10-CM

## 2012-11-03 LAB — PROTIME-INR: Prothrombin Time: 20.4 seconds — ABNORMAL HIGH (ref 11.6–15.2)

## 2012-11-03 LAB — BASIC METABOLIC PANEL
BUN: 28 mg/dL — ABNORMAL HIGH (ref 6–23)
Chloride: 105 mEq/L (ref 96–112)
GFR calc Af Amer: 58 mL/min — ABNORMAL LOW (ref >90)
GFR calc non Af Amer: 50 mL/min — ABNORMAL LOW (ref >90)
Potassium: 4.7 mEq/L (ref 3.5–5.1)
Sodium: 138 mEq/L (ref 135–145)

## 2012-11-03 LAB — CBC
HCT: 33.7 % — ABNORMAL LOW (ref 36.0–46.0)
Hemoglobin: 10.7 g/dL — ABNORMAL LOW (ref 12.0–15.0)
RBC: 3.65 MIL/uL — ABNORMAL LOW (ref 3.87–5.11)

## 2012-11-03 MED ORDER — WARFARIN SODIUM 4 MG PO TABS
4.0000 mg | ORAL_TABLET | Freq: Once | ORAL | Status: AC
  Administered 2012-11-03: 4 mg via ORAL
  Filled 2012-11-03: qty 1

## 2012-11-03 MED ORDER — AMIODARONE HCL 200 MG PO TABS
200.0000 mg | ORAL_TABLET | Freq: Every day | ORAL | Status: DC
  Administered 2012-11-03 – 2012-11-06 (×4): 200 mg via ORAL
  Filled 2012-11-03 (×4): qty 1

## 2012-11-03 MED ORDER — FUROSEMIDE 40 MG PO TABS
40.0000 mg | ORAL_TABLET | Freq: Every day | ORAL | Status: DC
  Administered 2012-11-04 – 2012-11-06 (×3): 40 mg via ORAL
  Filled 2012-11-03 (×3): qty 1

## 2012-11-03 MED ORDER — FUROSEMIDE 10 MG/ML IJ SOLN: 40.0000 mg | Freq: Every day | INTRAMUSCULAR | Status: DC

## 2012-11-03 MED ORDER — FOLIC ACID 1 MG PO TABS
1.0000 mg | ORAL_TABLET | Freq: Every day | ORAL | Status: DC
  Administered 2012-11-04 – 2012-11-06 (×3): 1 mg via ORAL
  Filled 2012-11-03 (×3): qty 1

## 2012-11-03 MED ORDER — FUROSEMIDE 40 MG PO TABS
40.0000 mg | ORAL_TABLET | Freq: Every day | ORAL | Status: DC
  Filled 2012-11-03: qty 1

## 2012-11-03 NOTE — Progress Notes (Signed)
Subjective: Still with cough.  Objective: Vital signs in last 24 hours: Temp:  [97.6 F (36.4 C)-98 F (36.7 C)] 97.8 F (36.6 C) (06/10 0500) Pulse Rate:  [61-82] 70 (06/10 0843) Resp:  [18-19] 19 (06/10 0500) BP: (98-117)/(52-73) 114/73 mmHg (06/10 0843) SpO2:  [96 %-98 %] 98 % (06/10 0500) Weight:  [137 lb 9.1 oz (62.4 kg)] 137 lb 9.1 oz (62.4 kg) (06/10 0500) Last BM Date: 11/02/12  Intake/Output from previous day: 06/09 0701 - 06/10 0700 In: 920 [P.O.:820; IV Piggyback:100] Out: 1451 [Urine:1450; Stool:1] Intake/Output this shift: Total I/O In: 600 [P.O.:600] Out: -   Medications Current Facility-Administered Medications  Medication Dose Route Frequency Provider Last Rate Last Dose  . acetaminophen (TYLENOL) tablet 650 mg  650 mg Oral Q6H PRN Zannie Cove, MD      . aspirin EC tablet 81 mg  81 mg Oral Daily Zannie Cove, MD   81 mg at 11/02/12 1037  . atorvastatin (LIPITOR) tablet 40 mg  40 mg Oral q1800 Zannie Cove, MD   40 mg at 11/02/12 1806  . azithromycin (ZITHROMAX) tablet 250 mg  250 mg Oral Q24H Renae Fickle, MD   250 mg at 11/02/12 1550  . benzonatate (TESSALON) capsule 100 mg  100 mg Oral TID Renae Fickle, MD   100 mg at 11/02/12 2144  . cefTRIAXone (ROCEPHIN) 1 g in dextrose 5 % 50 mL IVPB  1 g Intravenous Q24H Renae Fickle, MD   1 g at 11/02/12 1805  . ezetimibe (ZETIA) tablet 10 mg  10 mg Oral Daily Zannie Cove, MD   10 mg at 11/02/12 1038  . folic acid (FOLVITE) tablet 2 mg  2 mg Oral Daily Zannie Cove, MD   2 mg at 11/02/12 1037  . guaiFENesin-dextromethorphan (ROBITUSSIN DM) 100-10 MG/5ML syrup 5 mL  5 mL Oral Q4H PRN Renae Fickle, MD      . levothyroxine (SYNTHROID, LEVOTHROID) tablet 88 mcg  88 mcg Oral QAC breakfast Zannie Cove, MD   88 mcg at 11/03/12 0545  . metoprolol tartrate (LOPRESSOR) tablet 25 mg  25 mg Oral BID Renae Fickle, MD   25 mg at 11/02/12 2144  . polyethylene glycol (MIRALAX / GLYCOLAX) packet 17 g   17 g Oral Daily PRN Renae Fickle, MD      . potassium chloride SA (K-DUR,KLOR-CON) CR tablet 20 mEq  20 mEq Oral Daily Renae Fickle, MD   20 mEq at 11/02/12 1037  . warfarin (COUMADIN) tablet 4 mg  4 mg Oral ONCE-1800 Renae Fickle, MD      . Warfarin - Pharmacist Dosing Inpatient   Does not apply q1800 Drake Leach Rumbarger, RPH      . zolpidem (AMBIEN) tablet 5 mg  5 mg Oral QHS PRN Renae Fickle, MD        PE: General appearance: alert, cooperative and no distress Lungs: Deminished BS bilaterally.  No Rales. Heart: regular rate and rhythm and 2/6 sys MM Extremities: No LEE Pulses: 2+ and symmetric Neurologic: Grossly normal  Lab Results:   Recent Labs  11/01/12 1034 11/02/12 0340 11/03/12 0415  WBC 17.6* 11.3* 8.1  HGB 12.3 10.9* 10.7*  HCT 36.4 34.1* 33.7*  PLT 225 226 235   BMET  Recent Labs  11/01/12 0513 11/02/12 0340 11/03/12 0415  NA 138 137 138  K 3.3* 4.1 4.7  CL 101 101 105  CO2 27 26 27   GLUCOSE 112* 94 106*  BUN 32* 30* 28*  CREATININE 1.38* 1.07  0.99  CALCIUM 9.0 8.8 9.2   PT/INR  Recent Labs  11/01/12 0513 11/02/12 0340 11/03/12 0415  LABPROT 21.7* 20.5* 20.4*  INR 1.98* 1.83* 1.82*     Assessment/Plan   Principal Problem:   Systolic and diastolic CHF, acute on chronic Active Problems:   Paroxysmal atrial fibrillation, recurrent   Hypothyroidism   Coronary artery disease   Aortic valvular stenosis, moderate to severe   Hypotension, unspecified   Hypokalemia   Acute respiratory failure with hypoxia   Leukocytosis, unspecified   CAP (community acquired pneumonia)   Prolonged Q-T interval on ECG  Plan:   BP and HR stable.  Subtherapeutic INR on coumadin and followed by pharmacy.  Net fluids:  -574ml(24hrs)/ -3.5L(adm) and is currently not on any diuretics.  Amiodarone stopped due to long QTc.  Afib is rate controlled on lopressor 25mg  twice daily.  Azithromycin and rocephin for CAP.  She appears stable with no significant  CHF symptoms.    Echo revealed EF 55-60% and Grade two diastolic dysfunction.  I think we can still hold the diuretic for now.  I do not think her weight is accurate as the fluids are negative  Every day.  Moderate AS.   LOS: 4 days    HAGER, BRYAN 11/03/2012 9:47 AM  I have seen and examined the patient along with Wilburt Finlay, PA.  I have reviewed the chart, notes and new data.  I agree with PA's note.  Key new complaints: slowly improving Key examination changes: lungs remain clear of rales; spontaneously negative fluid balance Key new findings / data: WBC normalized.  PLAN: Home dose of PO diuretics. Resume amiodarone. QT prolongation was likely secondary to simultaneous use of quinolone and hypokalemia. Check ECG in AM.  Thurmon Fair, MD, [facility] [phone] 11/03/2012, 6:37 PM

## 2012-11-03 NOTE — Progress Notes (Signed)
ANTICOAGULATION CONSULT NOTE - Follow Up Consult  Pharmacy Consult for Coumadin Indication: atrial fibrillation  Allergies  Allergen Reactions  . Sulfa Antibiotics Itching and Rash    Patient Measurements: Height: 5\' 2"  (157.5 cm) Weight: 137 lb 9.1 oz (62.4 kg) (Scale C) IBW/kg (Calculated) : 50.1  Vital Signs: Temp: 97.8 F (36.6 C) (06/10 0500) Temp src: Oral (06/10 0500) BP: 98/52 mmHg (06/10 0500) Pulse Rate: 61 (06/10 0500)  Labs:  Recent Labs  11/01/12 0513  11/01/12 1034 11/02/12 0340 11/03/12 0415  HGB  --   < > 12.3 10.9* 10.7*  HCT  --   --  36.4 34.1* 33.7*  PLT  --   --  225 226 235  LABPROT 21.7*  --   --  20.5* 20.4*  INR 1.98*  --   --  1.83* 1.82*  CREATININE 1.38*  --   --  1.07 0.99  < > = values in this interval not displayed.  Estimated Creatinine Clearance: 34.8 ml/min (by C-G formula based on Cr of 0.99).   Medications:  Scheduled:  . aspirin EC  81 mg Oral Daily  . atorvastatin  40 mg Oral q1800  . azithromycin  250 mg Oral Q24H  . benzonatate  100 mg Oral TID  . cefTRIAXone (ROCEPHIN)  IV  1 g Intravenous Q24H  . ezetimibe  10 mg Oral Daily  . folic acid  2 mg Oral Daily  . levothyroxine  88 mcg Oral QAC breakfast  . metoprolol  25 mg Oral BID  . potassium chloride  20 mEq Oral Daily  . Warfarin - Pharmacist Dosing Inpatient   Does not apply q1800    Assessment: 79 y/o female patient on chronic Coumadin for history of afib. INR remains subtherapeutic and trending down. This is related to amiodarone being held as this drug interaction increase the INR. Will need to give larger coumadin dose while off of amiodarone.  Goal of Therapy:  INR 2-3   Plan:  Repeat Coumadin 4mg  today and f/u daily protime.  Verlene Mayer, PharmD, BCPS Pager [phone]  11/03/2012 8:32 AM

## 2012-11-03 NOTE — Care Management Note (Signed)
    Page 1 of 1   11/03/2012     11:09:11 AM   CARE MANAGEMENT NOTE 11/03/2012  Patient:  AYONA, YNIGUEZ   Account Number:  [id number]  Date Initiated:  11/03/2012  Documentation initiated by:  Tera Mater  Subjective/Objective Assessment:   79yo female admitted with CHF.  Pt. lives at home with spouse in [place].     Action/Plan:   progression, discharge planning, and disposition   Anticipated DC Date:  11/05/2012   Anticipated DC Plan:  HOME W HOME HEALTH SERVICES         Choice offered to / List presented to:             Status of service:  In process, will continue to follow Medicare Important Message given?   (If response is "NO", the following Medicare IM given date fields will be blank) Date Medicare IM given:   Date Additional Medicare IM given:    Discharge Disposition:    Per UR Regulation:  Reviewed for med. necessity/level of care/duration of stay  If discussed at Long Length of Stay Meetings, dates discussed:    Comments:  11/03/12 1107 Will await PT recomendations for disposition. PTA pt. was living with spouse at home.  Pt. receiving IV antibiotics for PNA.  NCM will continue to follow. Tera Mater, RN, BSN NCM [phone]

## 2012-11-03 NOTE — Progress Notes (Signed)
[facility] PROGRESS NOTE  [name] EXB:[id number] DOB: 1923-07-07 DOA: 10/30/2012 PCP: Carollee Herter, MD  Assessment/Plan  Hypoxic respiratory failure most likely due to combination of acute on chronic diastolic heart failure and underlying community-acquired pneumonia.  Acute and chronic diastolic CHF, primarily from valvular heart disease.  Patient had initial improvement in her dyspnea after diuresis of -1.6 L however she had increasing dyspnea and her BNP rose. She became mildly hypotensive. Repeat chest x-ray did not demonstrate any new infiltrates, however her WBC count and her procalcitonin were elevated.   - moderate to severe aortic stenosis on echo 1/13  - Repeat 2-D echocardiogram EF of 55-60% and grade 2 diastolic dysfunction. Evidence of elevated mean left atrial filling pressure. - Diuresis per cardiology, per last note continuing to hold diuretics - Discontinue Potassium supplementation  - Follow daily weights, I/Os  - Appreciate [facility] recommendations, followed by Dr.Croitoru -  Wean oxygen as tolerated -  Once on room air will need walking pulse ox -  Repeat proBNP and chest x-ray in the morning  Community-acquired pneumonia, patient feeling improved today -  Day 3 of antibiotic -  Continue tessalon and robitussin  HypoTN, blood pressure is still mildly low -  Continue to hold Norvasc and hydralazine -  Hold parameter placed on metoprolol -  Diabetic management per cardiology  Moderate to severe aortic valve stenosis. Peak gradient of and mean gradient of , AVA of approximately 1cm2 per ECHO 2013 -  Per cardiology  Paroxysmal A. Fib -  Telemetry: Initially atrial fibrillation with heart rate in the 90s to low 100s, then SR for the last two days  -  Continue metoprolol, Coumadin per pharmacy  -  hold amiodarone per cardiology recommendation due to prolonged QTC -  INR is mildly subtherapeutic  Prolonged QTC -  Minimize QTC prolonging  medications  history of CAD:  - Stable, EKG without acute ST-T wave changes  - Continue aspirin, metoprolol  - troponin x3 negative   Hypothyroidism: TSH 1.046 - continue Synthroid   Leukocytosis, resolved after initiation of antibiotics  Hypokalemia, likely due to Lasix, resolved, potassium now rising in the setting of no diuresis - Discontinue potassium supplements  Constipation -  Continue MiraLax  Normocytic anemia, likely due to marrow suppression from acute illness  Diet:  Healthy heart, 2 g sodium diet Access:  PIV IVF:  Off Proph: Coumadin, INR near therapeutic  Code Status:DNR  Family Communication:none at bedside  Disposition Plan: Continue telemetry    Cons[facility]ants[facility]  [facility]  Procedures:  Echo  Antibiotics:  None   HPI/Subjective:  The patient states that she feels about the same today. She still has cough productive of yellow-green sputum. She denies fevers chills, nausea, vomiting. She had a bowel movement yesterday.    Objective: Filed Vitals:   11/02/12 2100 11/03/12 0500 11/03/12 0843 11/03/12 1049  BP: 106/63 98/52 114/73 113/62  Pulse: 66 61 70 92  Temp: 97.6 F (36.4 C) 97.8 F (36.6 C)    TempSrc: Oral Oral    Resp: 18 19    Height:      Weight:  62.4 kg (137 lb 9.1 oz)    SpO2: 96% 98%      Intake/Output Summary (Last 24 hours) at 11/03/12 1203 Last data filed at 11/03/12 0954  Gross per 24 hour  Intake   1280 ml  Output   1301 ml  Net    -21 ml   [employer]  10/31/12 0700 11/02/12 0434 11/03/12 0500  Weight: 61.1 kg (134 lb 11.2 oz) 61.7 kg (136 lb 0.4 oz) 62.4 kg (137 lb 9.1 oz)    Exam:   General:  Caucasian female  No acute distress  HEENT:  NCAT, MMM, JVP to the angle of the mandible while sitting upright  Cardiovascular:  RRR, nl S1, S2, 3/6 systolic ejection murmur at the right upper sternal border also heard at the left sternal border, , 2+ pulses, warm extremities  Respiratory:   Rales heard throughout all lung fields , + rhonchi, no wheeze, no increased WOB  Abdomen:   NABS, soft, NT/ND  MSK:   Normal tone and bulk, 1+ LEE  Neuro:  Grossly intact  Data Reviewed: Basic Metabolic Panel:  Recent Labs Lab 10/30/12 0304 10/31/12 0405 11/01/12 0513 11/02/12 0340 11/03/12 0415  NA 140 140 138 137 138  K 3.8 3.6 3.3* 4.1 4.7  CL 103 101 101 101 105  CO2 24 29 27 26 27   GLUCOSE 137* 107* 112* 94 106*  BUN 24* 20 32* 30* 28*  CREATININE 1.20* 1.02 1.38* 1.07 0.99  CALCIUM 9.6 8.8 9.0 8.8 9.2   Liver Function Tests: No results found for this basename: AST, ALT, ALKPHOS, BILITOT, PROT, ALBUMIN,  in the last 168 hours No results found for this basename: LIPASE, AMYLASE,  in the last 168 hours No results found for this basename: AMMONIA,  in the last 168 hours CBC:  Recent Labs Lab 10/30/12 0304 10/31/12 0405 11/01/12 1034 11/02/12 0340 11/03/12 0415  WBC 15.8* 15.0* 17.6* 11.3* 8.1  NEUTROABS 13.7*  --   --   --   --   HGB 12.5 11.5* 12.3 10.9* 10.7*  HCT 38.3 35.0* 36.4 34.1* 33.7*  MCV 93.4 90.0 90.3 90.5 92.3  PLT 268 239 225 226 235   Cardiac Enzymes:  Recent Labs Lab 10/30/12 0304 10/30/12 0812  TROPONINI <0.30 <0.30   BNP (last 3 results)  Recent Labs  01/05/12 0350 10/30/12 0304 11/01/12 0513  PROBNP 736.7* 905.6* 3462.0*   CBG: No results found for this basename: GLUCAP,  in the last 168 hours  No results found for this or any previous visit (from the past 240 hour(s)).   Studies: No results found.  Scheduled Meds: . aspirin EC  81 mg Oral Daily  . atorvastatin  40 mg Oral q1800  . azithromycin  250 mg Oral Q24H  . benzonatate  100 mg Oral TID  . cefTRIAXone (ROCEPHIN)  IV  1 g Intravenous Q24H  . ezetimibe  10 mg Oral Daily  . folic acid  2 mg Oral Daily  . levothyroxine  88 mcg Oral QAC breakfast  . metoprolol  25 mg Oral BID  . potassium chloride  20 mEq Oral Daily  . warfarin  4 mg Oral ONCE-1800  . Warfarin  - Pharmacist Dosing Inpatient   Does not apply q1800   Continuous Infusions:   Principal Problem:   Systolic and diastolic CHF, acute on chronic Active Problems:   Paroxysmal atrial fibrillation, recurrent   Hypothyroidism   Coronary artery disease   Aortic valvular stenosis, moderate to severe   Hypotension, unspecified   Hypokalemia   Acute respiratory failure with hypoxia   Leukocytosis, unspecified   CAP (community acquired pneumonia)   Prolonged Q-T interval on ECG    Time spent: 30 min    Tabb Croghan, [facility]  [facility] Pager [phone]. If 7PM-7AM, please contact night-coverage at [email or web address], password [facility] 11/03/2012, 12:03  PM  LOS: 4 days

## 2012-11-04 ENCOUNTER — Inpatient Hospital Stay (HOSPITAL_COMMUNITY): Payer: Medicare Other

## 2012-11-04 DIAGNOSIS — I5043 Acute on chronic combined systolic (congestive) and diastolic (congestive) heart failure: Secondary | ICD-10-CM

## 2012-11-04 DIAGNOSIS — I359 Nonrheumatic aortic valve disorder, unspecified: Secondary | ICD-10-CM

## 2012-11-04 DIAGNOSIS — I509 Heart failure, unspecified: Secondary | ICD-10-CM

## 2012-11-04 DIAGNOSIS — I4891 Unspecified atrial fibrillation: Secondary | ICD-10-CM

## 2012-11-04 LAB — PROTIME-INR
INR: 2.08 — ABNORMAL HIGH (ref 0.00–1.49)
Prothrombin Time: 22.5 seconds — ABNORMAL HIGH (ref 11.6–15.2)

## 2012-11-04 LAB — BASIC METABOLIC PANEL
CO2: 25 mEq/L (ref 19–32)
Chloride: 104 mEq/L (ref 96–112)
GFR calc Af Amer: 52 mL/min — ABNORMAL LOW (ref >90)
Potassium: 4.3 mEq/L (ref 3.5–5.1)
Sodium: 139 mEq/L (ref 135–145)

## 2012-11-04 LAB — CBC
HCT: 33.1 % — ABNORMAL LOW (ref 36.0–46.0)
Hemoglobin: 10.6 g/dL — ABNORMAL LOW (ref 12.0–15.0)
MCV: 90.7 fL (ref 78.0–100.0)
RBC: 3.65 MIL/uL — ABNORMAL LOW (ref 3.87–5.11)
RDW: 14.7 % (ref 11.5–15.5)
WBC: 8.1 10*3/uL (ref 4.0–10.5)

## 2012-11-04 MED ORDER — WARFARIN SODIUM 2.5 MG PO TABS
2.5000 mg | ORAL_TABLET | Freq: Once | ORAL | Status: AC
  Administered 2012-11-04: 2.5 mg via ORAL
  Filled 2012-11-04: qty 1

## 2012-11-04 NOTE — Progress Notes (Signed)
ANTICOAGULATION CONSULT NOTE - Follow Up Consult  Pharmacy Consult for Coumadin Indication: atrial fibrillation  Allergies  Allergen Reactions  . Sulfa Antibiotics Itching and Rash    Patient Measurements: Height: 5\' 2"  (157.5 cm) Weight: 139 lb 9.6 oz (63.322 kg) IBW/kg (Calculated) : 50.1  Vital Signs: Temp: 97.6 F (36.4 C) (06/11 0445) Temp src: Oral (06/11 0445) BP: 121/84 mmHg (06/11 0445) Pulse Rate: 72 (06/11 0445)  Labs:  Recent Labs  11/02/12 0340 11/03/12 0415 11/04/12 0540  HGB 10.9* 10.7* 10.6*  HCT 34.1* 33.7* 33.1*  PLT 226 235 260  LABPROT 20.5* 20.4* 22.5*  INR 1.83* 1.82* 2.08*  CREATININE 1.07 0.99 1.08    Estimated Creatinine Clearance: 32.1 ml/min (by C-G formula based on Cr of 1.08).   Medications:  Scheduled:  . amiodarone  200 mg Oral Daily  . aspirin EC  81 mg Oral Daily  . atorvastatin  40 mg Oral q1800  . azithromycin  250 mg Oral Q24H  . benzonatate  100 mg Oral TID  . cefTRIAXone (ROCEPHIN)  IV  1 g Intravenous Q24H  . ezetimibe  10 mg Oral Daily  . folic acid  1 mg Oral Daily  . furosemide  40 mg Oral Daily  . levothyroxine  88 mcg Oral QAC breakfast  . metoprolol  25 mg Oral BID  . Warfarin - Pharmacist Dosing Inpatient   Does not apply q1800    Assessment: 79 y/o female patient on chronic Coumadin for history of afib. INR therapeutic at 2.08.   Goal of Therapy:  INR 2-3   Plan:  Coumadin 2.5mg  x 1 today and continue with daily protimes.  Celedonio Miyamoto, PharmD, BCPS Clinical Pharmacist Pager [phone]   11/04/2012 9:18 AM

## 2012-11-04 NOTE — Progress Notes (Signed)
Pt. Lying in bed asleep report received from [place], RN  No concerns at this time.

## 2012-11-04 NOTE — Evaluation (Signed)
Physical Therapy Evaluation Patient Details Name: [name] MRN: [id number] DOB: [birth year] Today's Date: 11/04/2012 Time: 1137-1203 PT Time Calculation (min): 26 min  PT Assessment / Plan / Recommendation Clinical Impression  Pt is an 80 yo female with CHF. Pt was completely independent prior to admission, reports she drove herself to the hospital. Pt is deconditioned and is requiring minG for all activity. Pt feeling "woozy" with ambulation and asking to sit after 50 ft. Pt would continue to benefit from Acute PT to progress OOB mobility and improve independence with ADLs. Pt would require supervision for OOB activities for return home and would benefit from HHPT to further improve mobility status and independence with ADLs. Discussed d/c plan with pt, pt agrees to consider HHPT.    PT Assessment  Patient needs continued PT services    Follow Up Recommendations  Home health PT;Supervision for mobility/OOB    Does the patient have the potential to tolerate intense rehabilitation      Barriers to Discharge        Equipment Recommendations  Rolling walker with 5" wheels (BSC for night time toileting with lasix)    Recommendations for Other Services     Frequency Min 3X/week    Precautions / Restrictions Precautions Precautions: Fall Restrictions Weight Bearing Restrictions: No   Pertinent Vitals/Pain 0/10      Mobility  Transfers Transfers: Sit to Stand;Stand to Sit Sit to Stand: 4: Min guard;With upper extremity assist;From chair/3-in-1 (fromch chair x2 from [facility] x1) Stand to Sit: 4: Min guard;To chair/3-in-1;With upper extremity assist (to chair x2 to [facility] x1) Details for Transfer Assistance: Pt minG for safety Ambulation/Gait Ambulation/Gait Assistance: 4: Min guard Ambulation Distance (Feet): 100 Feet (rest break afte 50 ft) Assistive device: Rolling walker Ambulation/Gait Assistance Details: Pt min G for ambulation due to pt feeling "woozy" SpO2 90-94% on RA  with ambulation, HR 77-89. Pt asking to rest after 50 ft Gait Pattern: Step-through pattern;Decreased stride length Gait velocity: decreased General Gait Details: V/C's to look up to avoid obstacles and stay close to RW Stairs: No    Exercises     PT Diagnosis: Difficulty walking  PT Problem List: Decreased activity tolerance;Decreased mobility;Decreased knowledge of use of DME;Decreased safety awareness PT Treatment Interventions: DME instruction;Gait training;Functional mobility training;Therapeutic activities;Stair training;Therapeutic exercise;Balance training;Neuromuscular re-education   PT Goals Acute Rehab PT Goals PT Goal Formulation: With patient Time For Goal Achievement: 11/18/12 Potential to Achieve Goals: Good Pt will go Supine/Side to Sit: with modified independence PT Goal: Supine/Side to Sit - Progress: Goal set today Pt will go Sit to Stand: with modified independence;with upper extremity assist PT Goal: Sit to Stand - Progress: Goal set today Pt will Ambulate: >150 feet;with modified independence;with least restrictive assistive device PT Goal: Ambulate - Progress: Goal set today Pt will Go Up / Down Stairs: 1-2 stairs;with modified independence PT Goal: Up/Down Stairs - Progress: Goal set today Pt will Perform Home Exercise Program: Independently PT Goal: Perform Home Exercise Program - Progress: Goal set today  Visit Information  Last PT Received On: 11/04/12 Assistance Needed: +1 PT/OT Co-Evaluation/Treatment: Yes    Subjective Data  Subjective: Pt recieved in chair, agreeable ot PT, states "I haven't walked since Friday" Patient Stated Goal: to go home   Prior Functioning  Home Living Lives With: Spouse Available Help at Discharge: Family;Available 24 hours/day Type of Home: House Home Access: Stairs to enter [employer] of Steps: 1 Entrance Stairs-Rails: Right Home Layout: One level Bathroom Shower/Tub: Tub/shower  unit;Curtain Bathroom  Toilet: Handicapped height Home Adaptive Equipment: Grab bars in shower Prior Function Level of Independence: Independent Able to Take Stairs?: Yes Driving: Yes Communication Communication: No difficulties Dominant Hand: Right    Cognition  Cognition Arousal/Alertness: Awake/alert Behavior During Therapy: WFL for tasks assessed/performed Overall Cognitive Status: Within Functional Limits for tasks assessed    Extremity/Trunk Assessment Right Upper Extremity Assessment RUE ROM/Strength/Tone: Within functional levels Left Upper Extremity Assessment LUE ROM/Strength/Tone: Within functional levels Right Lower Extremity Assessment RLE ROM/Strength/Tone: Within functional levels Left Lower Extremity Assessment LLE ROM/Strength/Tone: Within functional levels Trunk Assessment Trunk Assessment: Normal   Balance Balance Balance Assessed: Yes Static Sitting Balance Static Sitting - Balance Support: Bilateral upper extremity supported Static Sitting - Level of Assistance: 6: Modified independent (Device/Increase time) Static Sitting - Comment/# of Minutes: 2 Dynamic Standing Balance Dynamic Standing - Balance Support: No upper extremity supported;During functional activity Dynamic Standing - Level of Assistance: 5: Stand by assistance Dynamic Standing - Balance Activities:  (pulling up underwear after toileting) Dynamic Standing - Comments: Pt required supervision with minG for safety during functional activity  End of Session PT - End of Session Equipment Utilized During Treatment: Gait belt Activity Tolerance: Patient tolerated treatment well;Patient limited by fatigue Patient left: in chair;with call bell/phone within reach Nurse Communication: Mobility status  GP     11/04/2012, 12:46 PM Marvis Moeller, Student Physical Therapist Office #: [phone]

## 2012-11-04 NOTE — Progress Notes (Addendum)
[facility] PROGRESS NOTE  [name] UYQ:[id number] DOB: Jan 22, 1924 DOA: 10/30/2012 PCP: Carollee Herter, MD  Assessment/Plan  Acute Hypoxic respiratory failure most likely due to combination of acute on chronic diastolic heart failure and underlying community-acquired pneumonia.  Acute and chronic diastolic CHF, primarily from valvular heart disease.  Patient had initial improvement in her dyspnea after diuresis of -1.6 L however she had increasing dyspnea and her BNP rose. She became mildly hypotensive. Repeat chest x-ray did not demonstrate any new infiltrates, however her WBC count and her procalcitonin were elevated.   - moderate to severe aortic stenosis on echo 1/13  - Repeat 2-D echocardiogram EF of 55-60% and grade 2 diastolic dysfunction. Evidence of elevated mean left atrial filling pressure. - Diuresis per cardiology, diuretics were temporarily held and are being resumed by cardiology on 6/11. - Discontinue Potassium supplementation  - Follow daily weights, I/Os  - Appreciate [facility] recommendations, followed by Dr.Croitoru -  Wean oxygen as tolerated -  Once on room air will need walking pulse ox-will ambulate today and check O2 sats on room air. -  Repeat proBNP and chest x-ray in the morning  Acute on chronic diastolic and systolic CHF Improved. Denies dyspnea but does complain of some Orthopnea. Continue Lasix and management per cardiology.  Community-acquired pneumonia -  Day 4 of antibiotic -  Continue tessalon and robitussin  HypoTN, blood pressure improved -  Continue to hold Norvasc and hydralazine -  Hold parameter placed on metoprolol -  Diuretic management per cardiology  Moderate to severe aortic valve stenosis. Peak gradient of and mean gradient of , AVA of approximately 1cm2 per ECHO 2013 -  Per cardiology  Paroxysmal A. Fib -  Telemetry: Afib with CVR in 60's -  Continue metoprolol, Coumadin per pharmacy  -  hold amiodarone per  cardiology recommendation due to prolonged QTC -  INR is therapeutic  Prolonged QTC -  Minimize QTC prolonging medications. - Resolved.  history of CAD:  - Stable, EKG without acute ST-T wave changes  - Continue aspirin, metoprolol  - troponin x3 negative   Hypothyroidism: TSH 1.046 - continue Synthroid   Leukocytosis, resolved after initiation of antibiotics  Hypokalemia, likely due to Lasix, resolved, potassium now rising in the setting of no diuresis - Discontinued potassium supplements  Constipation -  Continue MiraLax  Normocytic anemia, likely due to marrow suppression from acute illness. Stable.  Diet:  Healthy heart, 2 g sodium diet Access:  PIV IVF:  Off Proph: Coumadin, INR near therapeutic  Code Status:DNR  Family Communication:none at bedside  Disposition Plan: Continue telemetry. Possible discharge home on 6/12.    Consultants:  [facility]  Procedures:  Echo  Antibiotics:  Azithromycin 6/8 >  IV Rocephin 6/8 >  HPI/Subjective: Patient states that she feels significantly better compared to admission. Denies dyspnea but does complain of some orthopnea. Denies cough or chest pain.  Objective: Filed Vitals:   11/03/12 1329 11/03/12 2147 11/04/12 0445 11/04/12 1000  BP: 109/59 108/55 121/84 115/59  Pulse: 77 67 72 75  Temp: 98.2 F (36.8 C) 98.1 F (36.7 C) 97.6 F (36.4 C)   TempSrc: Oral Oral Oral   Resp: 18     Height:      Weight:   63.322 kg (139 lb 9.6 oz)   SpO2: 97% 98% 97% 94%    Intake/Output Summary (Last 24 hours) at 11/04/12 1337 Last data filed at 11/04/12 0900  Gross per 24 hour  Intake  540 ml  Output   1000 ml  Net   -460 ml   Filed Weights   11/02/12 0434 11/03/12 0500 11/04/12 0445  Weight: 61.7 kg (136 lb 0.4 oz) 62.4 kg (137 lb 9.1 oz) 63.322 kg (139 lb 9.6 oz)    Exam:   General:  Caucasian female  No acute distress. Seen sitting at edge of bed this morning.  Cardiovascular:   Irregularly irregular, nl S1, S2, 3/6 systolic ejection murmur at the right upper sternal border also heard at the left sternal border, , 2+ pulses, warm extremities. Telemetry A. fib with controlled ventricular rate in the 60s.  Respiratory:  Occasional basal crackles but otherwise clear to auscultation. No increased work of breathing.  Abdomen:   Normal bowel sounds heard, soft, NT/ND  MSK:   Normal tone and bulk, 1+ LEE  Neuro:  Grossly intact. Alert and oriented x3  Data Reviewed: Basic Metabolic Panel:  Recent Labs Lab 10/31/12 0405 11/01/12 0513 11/02/12 0340 11/03/12 0415 11/04/12 0540  NA 140 138 137 138 139  K 3.6 3.3* 4.1 4.7 4.3  CL 101 101 101 105 104  CO2 29 27 26 27 25   GLUCOSE 107* 112* 94 106* 104*  BUN 20 32* 30* 28* 24*  CREATININE 1.02 1.38* 1.07 0.99 1.08  CALCIUM 8.8 9.0 8.8 9.2 8.7   Liver Function Tests: No results found for this basename: AST, ALT, ALKPHOS, BILITOT, PROT, ALBUMIN,  in the last 168 hours No results found for this basename: LIPASE, AMYLASE,  in the last 168 hours No results found for this basename: AMMONIA,  in the last 168 hours CBC:  Recent Labs Lab 10/30/12 0304 10/31/12 0405 11/01/12 1034 11/02/12 0340 11/03/12 0415 11/04/12 0540  WBC 15.8* 15.0* 17.6* 11.3* 8.1 8.1  NEUTROABS 13.7*  --   --   --   --   --   HGB 12.5 11.5* 12.3 10.9* 10.7* 10.6*  HCT 38.3 35.0* 36.4 34.1* 33.7* 33.1*  MCV 93.4 90.0 90.3 90.5 92.3 90.7  PLT 268 239 225 226 235 260   Cardiac Enzymes:  Recent Labs Lab 10/30/12 0304 10/30/12 0812  TROPONINI <0.30 <0.30   BNP (last 3 results)  Recent Labs  10/30/12 0304 11/01/12 0513 11/04/12 0540  PROBNP 905.6* 3462.0* 1379.0*   CBG: No results found for this basename: GLUCAP,  in the last 168 hours  No results found for this or any previous visit (from the past 240 hour(s)).   Studies: Dg Chest 2 View  11/04/2012   *RADIOLOGY REPORT*  Clinical Data: Cough, congestion and shortness of  breath.  CHEST - 2 VIEW  Comparison: Chest x-ray 11/01/2012.  Findings: Lungs appear hyperexpanded with flattening of the hemidiaphragms, increased retrosternal air space and pruning of the pulmonary vasculature in the periphery, suggestive of underlying COPD.  Ill-defined opacity in the medial aspect of the left base is favored to reflect subsegmental atelectasis, although underlying air space consolidation is difficult to entirely exclude.  Small left pleural effusion.  No evidence of pulmonary edema.  Bilateral apical pleuroparenchymal thickening, most compatible with chronic scarring.  Atherosclerosis in the thoracic aorta.  Upper mediastinal contours are otherwise within normal limits.  IMPRESSION: 1.  Minimal subsegmental atelectasis in the left lower lobe with small left pleural effusion. 2.  Atherosclerosis. 3.  Chronic changes of COPD redemonstrated.   Original Report Authenticated By: Trudie Reed, M.D.    Scheduled Meds: . amiodarone  200 mg Oral Daily  . aspirin EC  81  mg Oral Daily  . atorvastatin  40 mg Oral q1800  . azithromycin  250 mg Oral Q24H  . benzonatate  100 mg Oral TID  . cefTRIAXone (ROCEPHIN)  IV  1 g Intravenous Q24H  . ezetimibe  10 mg Oral Daily  . folic acid  1 mg Oral Daily  . furosemide  40 mg Oral Daily  . levothyroxine  88 mcg Oral QAC breakfast  . metoprolol  25 mg Oral BID  . warfarin  2.5 mg Oral ONCE-1800  . Warfarin - Pharmacist Dosing Inpatient   Does not apply q1800   Continuous Infusions:   Principal Problem:   Systolic and diastolic CHF, acute on chronic Active Problems:   Paroxysmal atrial fibrillation, recurrent   Hypothyroidism   Coronary artery disease   Aortic valvular stenosis, moderate to severe   Hypotension, unspecified   Hypokalemia   Acute respiratory failure with hypoxia   Leukocytosis, unspecified   CAP (community acquired pneumonia)   Prolonged Q-T interval on ECG    Time spent: 30 min    [facility]  [facility] Pager [phone]. If 7PM-7AM, please contact night-coverage at [email or web address], password [facility] 11/04/2012, 1:37 PM  LOS: 5 days

## 2012-11-04 NOTE — Progress Notes (Addendum)
Subjective: No chest pain, mild SOB + congested cough  Objective: Vital signs in last 24 hours: Temp:  [97.6 F (36.4 C)-98.2 F (36.8 C)] 97.6 F (36.4 C) (06/11 0445) Pulse Rate:  [67-77] 72 (06/11 0445) Resp:  [18] 18 (06/10 1329) BP: (108-121)/(55-84) 121/84 mmHg (06/11 0445) SpO2:  [97 %-98 %] 97 % (06/11 0445) Weight:  [139 lb 9.6 oz (63.322 kg)] 139 lb 9.6 oz (63.322 kg) (06/11 0445) Weight change: 2 lb 0.5 oz (0.922 kg) Last BM Date: 11/03/12 Intake/Output from previous day: +112 (-3474 since admit) wt 139 up from 134 lbs on the 7th 06/10 0701 - 06/11 0700 In: 1362 [P.O.:1312; IV Piggyback:50] Out: 1250 [Urine:1250] Intake/Output this shift: Total I/O In: 240 [P.O.:240] Out: 400 [Urine:400]  PE: General:Pleasant affect, NAD Skin:Warm and dry, brisk capillary refill Neck:supple, no JVD  Heart:irreg irreg with 2/6 systolic murmur, no gallup, rub or click Lungs:diminshed 1/3 way up, no rhonchi, or wheezes ZOX:WRUE, non tender, + BS, do not palpate liver spleen or masses Ext:no lower ext edema, 2+ pedal pulses, 2+ radial pulses Neuro:alert and oriented, MAE, follows commands, + facial symmetry  TELE:  Atrial fib rate controlled, Qtc 442 on EKG  Lab Results:  Recent Labs  11/03/12 0415 11/04/12 0540  WBC 8.1 8.1  HGB 10.7* 10.6*  HCT 33.7* 33.1*  PLT 235 260   BMET  Recent Labs  11/03/12 0415 11/04/12 0540  NA 138 139  K 4.7 4.3  CL 105 104  CO2 27 25  GLUCOSE 106* 104*  BUN 28* 24*  CREATININE 0.99 1.08  CALCIUM 9.2 8.7   No results found for this basename: TROPONINI, CK, MB,  in the last 72 hours  Lab Results  Component Value Date   CHOL  Value: 140        ATP III CLASSIFICATION:  <200     mg/dL   Desirable  454-098  mg/dL   Borderline High  >=119    mg/dL   High        05/31/7827   HDL 44 12/31/2008   LDLCALC  Value: 83        Total Cholesterol/HDL:CHD Risk Coronary Heart Disease Risk Table                     Men   Women  1/2 Average Risk    3.4   3.3  Average Risk       5.0   4.4  2 X Average Risk   9.6   7.1  3 X Average Risk  23.4   11.0        Use the calculated Patient Ratio above and the CHD Risk Table to determine the patient's CHD Risk.        ATP III CLASSIFICATION (LDL):  <100     mg/dL   Optimal  562-130  mg/dL   Near or Above                    Optimal  130-159  mg/dL   Borderline  865-784  mg/dL   High  >696     mg/dL   Very High 07/05/5282   TRIG 67 12/31/2008   CHOLHDL 3.2 12/31/2008   No results found for this basename: HGBA1C     Lab Results  Component Value Date   TSH 1.046 10/31/2012   BNP (last 3 results)  Recent Labs  10/30/12 0304 11/01/12 0513 11/04/12 0540  PROBNP 905.6* 3462.0*  1379.0*     Studies/Results: Dg Chest 2 View  11/04/2012   *RADIOLOGY REPORT*  Clinical Data: Cough, congestion and shortness of breath.  CHEST - 2 VIEW  Comparison: Chest x-ray 11/01/2012.  Findings: Lungs appear hyperexpanded with flattening of the hemidiaphragms, increased retrosternal air space and pruning of the pulmonary vasculature in the periphery, suggestive of underlying COPD.  Ill-defined opacity in the medial aspect of the left base is favored to reflect subsegmental atelectasis, although underlying air space consolidation is difficult to entirely exclude.  Small left pleural effusion.  No evidence of pulmonary edema.  Bilateral apical pleuroparenchymal thickening, most compatible with chronic scarring.  Atherosclerosis in the thoracic aorta.  Upper mediastinal contours are otherwise within normal limits.  IMPRESSION: 1.  Minimal subsegmental atelectasis in the left lower lobe with small left pleural effusion. 2.  Atherosclerosis. 3.  Chronic changes of COPD redemonstrated.   Original Report Authenticated By: Trudie Reed, M.D.   2D Echo: Study Conclusions  - Left ventricle: The cavity size was normal. Wall thickness was normal. Systolic function was normal. The estimated EF was in the range of 55% to 60%. Wall motion  was normal; there were no regional wall motion abnormalities. Features are consistent with a pseudonormal left ventricular filling pattern, with concomitant abnormal relaxation and increased filling pressure (grade 2 diastolic dysfunction). Doppler parameters are consistent with elevated mean left atrial filling pressure. - Aortic valve: Valve mobility was restricted. There was moderate stenosis. Valve area: 1.25cm^2(VTI). Valve area: 1.19cm^2 (Vmax). - Mitral valve: Calcified annulus. Mildly thickened leaflets - Left atrium: The atrium was moderately dilated. - Right atrium: The atrium was mildly to moderately dilated. - Pericardium, extracardiac: A trivial, free-flowing pericardial effusion was identified circumferential to the heart.  Medications: I have reviewed the patient's current medications. Scheduled Meds: . amiodarone  200 mg Oral Daily  . aspirin EC  81 mg Oral Daily  . atorvastatin  40 mg Oral q1800  . azithromycin  250 mg Oral Q24H  . benzonatate  100 mg Oral TID  . cefTRIAXone (ROCEPHIN)  IV  1 g Intravenous Q24H  . ezetimibe  10 mg Oral Daily  . folic acid  1 mg Oral Daily  . furosemide  40 mg Oral Daily  . levothyroxine  88 mcg Oral QAC breakfast  . metoprolol  25 mg Oral BID  . warfarin  2.5 mg Oral ONCE-1800  . Warfarin - Pharmacist Dosing Inpatient   Does not apply q1800   Continuous Infusions:  PRN Meds:.acetaminophen, guaiFENesin-dextromethorphan, polyethylene glycol, zolpidem  Assessment/Plan: Principal Problem:   Systolic and diastolic CHF, acute on chronic Active Problems:   Paroxysmal atrial fibrillation, recurrent   Aortic valvular stenosis, moderate to severe   Hypothyroidism   Coronary artery disease   Hypotension, unspecified   Hypokalemia   Acute respiratory failure with hypoxia   Leukocytosis, unspecified   CAP (community acquired pneumonia)   Prolonged Q-T interval on ECG  PLAN: INR at 2.08 now Pro BNP 1379 down from pk of 3462 A fib  continues rate controlled.  On amiodarone at 200 mg daily her home dose prolonged QT resolved, thought to be related to combination of Levaquin and amiodarone.    Moderate AS on Echo.  Small/trivial pericardial effusion.  Has not yet ambulated.  LOS: 5 days   Time spent with pt/chart  including MD time. : . [facility] R  Nurse Practitioner Certified Pager [phone] 11/04/2012, 10:52 AM  I have seen and evaluated the patient this PM along with Vernona Rieger  Annie Paras, NP. I agree with her findings, examination as well as impression recommendations.  Admitted for what looks like URI-Bronchits-PNA Sx that likely exacerbated her cardiac status - AS is moderate & EF is normal.  Back on Amiodarone -- ECG today actually shows Afib vs. Aflutter with variable rate -- controlled response.  Have restarted PO Lasix - to avoid developing volume overload.- BNP level is dropping.    Still probably needs a day or so to regain strength.   MD Time with pt: 5 min  Alejandra Hunt W, M.D., M.S. [facility] [address] [place], [place]  [address]  [phone] Pager # [phone] 11/04/2012 2:02 PM

## 2012-11-04 NOTE — Evaluation (Signed)
Physical Therapy Evaluation Patient Details Name: [name] MRN: [id number] DOB: February 16, 1924 Today's Date: 11/04/2012 Time: 1137-1203 PT Time Calculation (min): 26 min  PT Assessment / Plan / Recommendation Clinical Impression  Pt is an 79 yo female with CHF. Pt was completely independent prior to admission, reports she drove herself to the hospital. Pt is deconditioned and is requiring minG for all activity. Pt feeling "woozy" with ambulation and asking to sit after 50 ft. Pt would continue to benefit from Acute PT to progress OOB mobility and improve independence with ADLs. Pt would require supervision for OOB activities for return home and would benefit from HHPT to further improve mobility status and independence with ADLs. Discussed d/c plan with pt, pt agrees to consider HHPT.    PT Assessment  Patient needs continued PT services    Follow Up Recommendations  Home health PT;Supervision for mobility/OOB    Does the patient have the potential to tolerate intense rehabilitation      Barriers to Discharge        Equipment Recommendations  Rolling walker with 5" wheels (BSC for night time toileting with lasix)    Recommendations for Other Services     Frequency Min 3X/week    Precautions / Restrictions Precautions Precautions: Fall Restrictions Weight Bearing Restrictions: No   Pertinent Vitals/Pain 0/10      Mobility  Transfers Transfers: Sit to Stand;Stand to Sit Sit to Stand: 4: Min guard;With upper extremity assist;From chair/3-in-1 (fromch chair x2 from [facility] x1) Stand to Sit: 4: Min guard;To chair/3-in-1;With upper extremity assist (to chair x2 to [facility] x1) Details for Transfer Assistance: Pt minG for safety Ambulation/Gait Ambulation/Gait Assistance: 4: Min guard Ambulation Distance (Feet): 100 Feet (rest break afte 50 ft) Assistive device: Rolling walker Ambulation/Gait Assistance Details: Pt min G for ambulation due to pt feeling "woozy" SpO2 90-94% on RA  with ambulation, HR 77-89. Pt asking to rest after 50 ft Gait Pattern: Step-through pattern;Decreased stride length Gait velocity: decreased General Gait Details: V/C's to look up to avoid obstacles and stay close to RW Stairs: No    Exercises     PT Diagnosis: Difficulty walking  PT Problem List: Decreased activity tolerance;Decreased mobility;Decreased knowledge of use of DME;Decreased safety awareness PT Treatment Interventions: DME instruction;Gait training;Functional mobility training;Therapeutic activities;Stair training;Therapeutic exercise;Balance training;Neuromuscular re-education   PT Goals Acute Rehab PT Goals PT Goal Formulation: With patient Time For Goal Achievement: 11/18/12 Potential to Achieve Goals: Good Pt will go Supine/Side to Sit: with modified independence PT Goal: Supine/Side to Sit - Progress: Goal set today Pt will go Sit to Stand: with modified independence;with upper extremity assist PT Goal: Sit to Stand - Progress: Goal set today Pt will Ambulate: >150 feet;with modified independence;with least restrictive assistive device PT Goal: Ambulate - Progress: Goal set today Pt will Go Up / Down Stairs: 1-2 stairs;with modified independence PT Goal: Up/Down Stairs - Progress: Goal set today Pt will Perform Home Exercise Program: Independently PT Goal: Perform Home Exercise Program - Progress: Goal set today  Visit Information  Last PT Received On: 11/04/12 Assistance Needed: +1 PT/OT Co-Evaluation/Treatment: Yes    Subjective Data  Subjective: Pt recieved in chair, agreeable ot PT, states "I haven't walked since Friday" Patient Stated Goal: to go home   Prior Functioning  Home Living Lives With: Spouse Available Help at Discharge: Family;Available 24 hours/day Type of Home: House Home Access: Stairs to enter [employer] of Steps: 1 Entrance Stairs-Rails: Right Home Layout: One level Bathroom Shower/Tub: Tub/shower  unit;Curtain Bathroom  Toilet: Handicapped height Home Adaptive Equipment: Grab bars in shower Prior Function Level of Independence: Independent Able to Take Stairs?: Yes Driving: Yes Communication Communication: No difficulties Dominant Hand: Right    Cognition  Cognition Arousal/Alertness: Awake/alert Behavior During Therapy: WFL for tasks assessed/performed Overall Cognitive Status: Within Functional Limits for tasks assessed    Extremity/Trunk Assessment Right Upper Extremity Assessment RUE ROM/Strength/Tone: Within functional levels Left Upper Extremity Assessment LUE ROM/Strength/Tone: Within functional levels Right Lower Extremity Assessment RLE ROM/Strength/Tone: Within functional levels Left Lower Extremity Assessment LLE ROM/Strength/Tone: Within functional levels Trunk Assessment Trunk Assessment: Normal   Balance Balance Balance Assessed: Yes Static Sitting Balance Static Sitting - Balance Support: Bilateral upper extremity supported Static Sitting - Level of Assistance: 6: Modified independent (Device/Increase time) Static Sitting - Comment/# of Minutes: 2 Dynamic Standing Balance Dynamic Standing - Balance Support: No upper extremity supported;During functional activity Dynamic Standing - Level of Assistance: 5: Stand by assistance Dynamic Standing - Balance Activities:  (pulling up underwear after toileting) Dynamic Standing - Comments: Pt required supervision with minG for safety during functional activity  End of Session PT - End of Session Equipment Utilized During Treatment: Gait belt Activity Tolerance: Patient tolerated treatment well;Patient limited by fatigue Patient left: in chair;with call bell/phone within reach Nurse Communication: Mobility status  GP     11/04/2012, 12:46 PM Marvis Moeller, Student Physical Therapist Office #: [phone]   Agree with above assessment.  Lewis Shock, PT, DPT Pager #: [phone] Office #: [phone]

## 2012-11-04 NOTE — Evaluation (Signed)
Occupational Therapy Evaluation Patient Details Name: [name] MRN: [id number] DOB: 1924/03/29 Today's Date: 11/04/2012 Time: 2841-3244 OT Time Calculation (min): 24 min  OT Assessment / Plan / Recommendation Clinical Impression  Pt is an 79 year old woman admitted for PNA and CHF. She has not ambulated since admission and presents with generalized weakness, decreased endurance, and mild balance impairment interfering with mobility and ADL.  Will follow acutely to instruct in energy conservation strategies and ADL training for return home with husband.    OT Assessment  Patient needs continued OT Services    Follow Up Recommendations  Supervision/Assistance - 24 hour    Barriers to Discharge      Equipment Recommendations  3 in 1 bedside comode    Recommendations for Other Services    Frequency  Min 2X/week    Precautions / Restrictions Precautions Precautions: Fall Restrictions Weight Bearing Restrictions: No   Pertinent Vitals/Pain No pain, pt's 02 sats remained greater than 90% on RA with activity.    ADL  Eating/Feeding: Independent Where Assessed - Eating/Feeding: Chair Grooming: Wash/dry hands;Set up Where Assessed - Grooming: Unsupported sitting Upper Body Bathing: Minimal assistance Where Assessed - Upper Body Bathing: Unsupported sitting Lower Body Bathing: Minimal assistance Where Assessed - Lower Body Bathing: Unsupported sitting;Supported sit to stand Upper Body Dressing: Min guard Where Assessed - Upper Body Dressing: Unsupported standing Lower Body Dressing: Minimal assistance Where Assessed - Lower Body Dressing: Unsupported sitting;Supported sit to stand Toilet Transfer: Min Pension scheme manager Method: Sit to Barista: Bedside commode Toileting - Architect and Hygiene: Min guard Where Assessed - Engineer, mining and Hygiene: Standing Equipment Used: Gait belt;Rolling  walker Transfers/Ambulation Related to ADLs: ambulated with RW and min guard assist with chair following. ADL Comments: Pt primarily limited by endurance and some mild balance deficits.    OT Diagnosis: Generalized weakness  OT Problem List: Decreased activity tolerance;Impaired balance (sitting and/or standing);Decreased knowledge of use of DME or AE;Cardiopulmonary status limiting activity OT Treatment Interventions: Self-care/ADL training;Energy conservation;DME and/or AE instruction;Patient/family education   OT Goals Acute Rehab OT Goals OT Goal Formulation: With patient Time For Goal Achievement: 11/11/12 Potential to Achieve Goals: Good ADL Goals Pt Will Perform Grooming: with supervision;Standing at sink ADL Goal: Grooming - Progress: Goal set today Pt Will Perform Upper Body Bathing: with set-up;Sitting at sink ADL Goal: Upper Body Bathing - Progress: Goal set today Pt Will Perform Lower Body Bathing: with supervision;Sitting at sink;Standing at sink ADL Goal: Lower Body Bathing - Progress: Goal set today Pt Will Perform Upper Body Dressing: with set-up;Sitting, chair ADL Goal: Upper Body Dressing - Progress: Goal set today Pt Will Perform Lower Body Dressing: with supervision;Sit to stand from chair ADL Goal: Lower Body Dressing - Progress: Goal set today Pt Will Transfer to Toilet: with supervision;Ambulation;with DME ADL Goal: Toilet Transfer - Progress: Goal set today Pt Will Perform Toileting - Clothing Manipulation: with supervision;Standing ADL Goal: Toileting - Clothing Manipulation - Progress: Goal set today Pt Will Perform Toileting - Hygiene: with supervision;Sit to stand from 3-in-1/toilet ADL Goal: Toileting - Hygiene - Progress: Goal set today Pt Will Perform Tub/Shower Transfer: Tub transfer;Ambulation;Shower seat with back;with supervision ADL Goal: Web designer - Progress: Goal set today Miscellaneous OT Goals Miscellaneous OT Goal #1: Pt will  verbalize at least 3 energy conservation strategies independently. OT Goal: Miscellaneous Goal #1 - Progress: Goal set today  Visit Information  Last OT Received On: 11/04/12 Assistance Needed: +1 PT/OT Co-Evaluation/Treatment: Yes  Subjective Data  Subjective: "This walker makes me feel 89." Patient Stated Goal: Home, return to active lifestyle.   Prior Functioning     Home Living Lives With: Spouse Available Help at Discharge: Family;Available 24 hours/day Type of Home: House Home Access: Stairs to enter [employer] of Steps: 1 Entrance Stairs-Rails: Right Home Layout: One level Bathroom Shower/Tub: Forensic scientist: Handicapped height Home Adaptive Equipment: Grab bars in shower (pt may have a tub seat as well) Prior Function Level of Independence: Independent Able to Take Stairs?: Yes Driving: Yes Communication Communication: No difficulties Dominant Hand: Right         Vision/Perception Vision - History Patient Visual Report: No change from baseline   Cognition  Cognition Arousal/Alertness: Awake/alert Behavior During Therapy: WFL for tasks assessed/performed Overall Cognitive Status: Within Functional Limits for tasks assessed    Extremity/Trunk Assessment Right Upper Extremity Assessment RUE ROM/Strength/Tone: Within functional levels (arthritic changes in hands) RUE Coordination: WFL - gross/fine motor Left Upper Extremity Assessment LUE ROM/Strength/Tone: Within functional levels (arthritic changes in hand) LUE Coordination: WFL - gross/fine motor Trunk Assessment Trunk Assessment: Normal     Mobility Bed Mobility Bed Mobility: Not assessed Transfers Sit to Stand: 4: Min guard;With upper extremity assist;From chair/3-in-1 Stand to Sit: 4: Min guard;To chair/3-in-1;With upper extremity assist Details for Transfer Assistance: Pt minG for safety     Exercise     Balance Balance Balance Assessed:  Yes Static Sitting Balance Static Sitting - Balance Support: Bilateral upper extremity supported Static Sitting - Level of Assistance: 6: Modified independent (Device/Increase time) Static Sitting - Comment/# of Minutes: 2 Dynamic Standing Balance Dynamic Standing - Balance Support: No upper extremity supported;During functional activity Dynamic Standing - Level of Assistance: 5: Stand by assistance Dynamic Standing - Balance Activities:  (pulling up underwear after toileting) Dynamic Standing - Comments: Pt required supervision with minG for safety during functional activity   End of Session OT - End of Session Activity Tolerance: Patient limited by fatigue Patient left: in chair;with call bell/phone within reach Nurse Communication: Mobility status (urine poured off)  GO     Evern Bio 11/04/2012, 1:46 PM [phone]

## 2012-11-05 ENCOUNTER — Inpatient Hospital Stay (HOSPITAL_COMMUNITY): Payer: Medicare Other

## 2012-11-05 LAB — PRO B NATRIURETIC PEPTIDE: Pro B Natriuretic peptide (BNP): 1345 pg/mL — ABNORMAL HIGH (ref 0–450)

## 2012-11-05 LAB — BASIC METABOLIC PANEL
CO2: 26 mEq/L (ref 19–32)
Chloride: 103 mEq/L (ref 96–112)
Creatinine, Ser: 0.89 mg/dL (ref 0.50–1.10)
GFR calc Af Amer: 66 mL/min — ABNORMAL LOW (ref >90)
Sodium: 137 mEq/L (ref 135–145)

## 2012-11-05 LAB — PROTIME-INR
INR: 2.59 — ABNORMAL HIGH (ref 0.00–1.49)
Prothrombin Time: 26.5 seconds — ABNORMAL HIGH (ref 11.6–15.2)

## 2012-11-05 MED ORDER — WARFARIN 1.25 MG HALF TABLET
1.2500 mg | ORAL_TABLET | Freq: Once | ORAL | Status: AC
  Administered 2012-11-05: 1.25 mg via ORAL
  Filled 2012-11-05: qty 1

## 2012-11-05 MED ORDER — FUROSEMIDE 40 MG PO TABS
40.0000 mg | ORAL_TABLET | Freq: Once | ORAL | Status: AC
  Administered 2012-11-05: 40 mg via ORAL
  Filled 2012-11-05: qty 1

## 2012-11-05 NOTE — Progress Notes (Signed)
11/05/12 1400 PT recommends home health PT.  In to speak with pt. about home health services as well as giving list of agencies.  Pt. chose [facility].  TC to [place], with [facility], to give referral.  Pt. may dc home tomorrow. Tera Mater, RN, BSN NCM [phone]

## 2012-11-05 NOTE — Progress Notes (Signed)
Pts O2 sats on room air 93% while resting. Pt's O2 sats on room air while ambulating 92%. Pt tolerated second walk today very well. Pt ambulated with standby assistance approx. 300 feet.

## 2012-11-05 NOTE — Progress Notes (Signed)
Occupational Therapy Treatment and Discharge Patient Details Name: [name] MRN: [id number] DOB: June 18, 1923 Today's Date: 11/05/2012 Time: 7846-9629 OT Time Calculation (min): 19 min  OT Assessment / Plan / Recommendation Comments on Treatment Session This 79 yo female admitted for PNA and CHF presents to acute OT at an overall S level and will have this at home from her husband of 66 years on the 30th of this month (June). Acute OT will sign off.    Follow Up Recommendations  Supervision/Assistance - 24 hour       Equipment Recommendations  None recommended by OT          Plan Discharge plan remains appropriate    Precautions / Restrictions Precautions Precautions: Fall Restrictions Weight Bearing Restrictions: No       ADL  Transfers/Ambulation Related to ADLs: S for all without RW ADL Comments: Pt's endurance seems not to be a limiting factor today. I did go over the energy conservation handout with her--highlighting the ones that might pertain to her--she verbalized understanding. Pt thinks she has a shower seat at home, if so she will use it, if not she will see how she does with her first shower and then make a decision whether she feels like she needs one or not.  Pt able to move around her room at a S level, even putting items in her closet without balance or endurance issues.      OT Goals Miscellaneous OT Goals OT Goal: Miscellaneous Goal #1 - Progress: Met Pt moving around room at S level and will have this at home, thus all other goals are discontinued  Visit Information  Last OT Received On: 11/05/12 Assistance Needed: +1    Subjective Data  Subjective: "I hope to go home today"      Cognition  Cognition Arousal/Alertness: Awake/alert Behavior During Therapy: WFL for tasks assessed/performed Overall Cognitive Status: Within Functional Limits for tasks assessed    Mobility  Bed Mobility Details for Bed Mobility Assistance: Pt up in recliner upon  arrival. Transfers Transfers: Sit to Stand;Stand to Sit Sit to Stand: 5: Supervision;With upper extremity assist;From chair/3-in-1;With armrests Stand to Sit: 5: Supervision;With upper extremity assist;With armrests;To chair/3-in-1          End of Session OT - End of Session Activity Tolerance: Patient tolerated treatment well Patient left: in chair;with call bell/phone within reach       Evette Georges [phone] 11/05/2012, 11:28 AM

## 2012-11-05 NOTE — Progress Notes (Signed)
Subjective: She reports considerable cough in supine position.  Objective: Vital signs in last 24 hours: Temp:  [97.3 F (36.3 C)-97.9 F (36.6 C)] 97.7 F (36.5 C) (06/12 0538) Pulse Rate:  [71-87] 83 (06/12 0538) Resp:  [18-20] 19 (06/12 0538) BP: (97-117)/(54-75) 117/70 mmHg (06/12 0538) SpO2:  [94 %-95 %] 95 % (06/12 0538) Weight:  [135 lb 4.8 oz (61.372 kg)] 135 lb 4.8 oz (61.372 kg) (06/12 0538) Last BM Date: 11/04/12  Intake/Output from previous day: 06/11 0701 - 06/12 0700 In: 720 [P.O.:720] Out: 1852 [Urine:1850; Stool:2] Intake/Output this shift: Total I/O In: 360 [P.O.:360] Out: 300 [Urine:300]  Medications Current Facility-Administered Medications  Medication Dose Route Frequency Provider Last Rate Last Dose  . acetaminophen (TYLENOL) tablet 650 mg  650 mg Oral Q6H PRN Zannie Cove, MD      . amiodarone (PACERONE) tablet 200 mg  200 mg Oral Daily Mihai Croitoru, MD   200 mg at 11/04/12 0935  . aspirin EC tablet 81 mg  81 mg Oral Daily Zannie Cove, MD   81 mg at 11/04/12 0935  . atorvastatin (LIPITOR) tablet 40 mg  40 mg Oral q1800 Zannie Cove, MD   40 mg at 11/04/12 1704  . azithromycin (ZITHROMAX) tablet 250 mg  250 mg Oral Q24H Renae Fickle, MD   250 mg at 11/04/12 1703  . benzonatate (TESSALON) capsule 100 mg  100 mg Oral TID Renae Fickle, MD   100 mg at 11/04/12 2141  . cefTRIAXone (ROCEPHIN) 1 g in dextrose 5 % 50 mL IVPB  1 g Intravenous Q24H Renae Fickle, MD   1 g at 11/04/12 1704  . ezetimibe (ZETIA) tablet 10 mg  10 mg Oral Daily Zannie Cove, MD   10 mg at 11/04/12 0935  . folic acid (FOLVITE) tablet 1 mg  1 mg Oral Daily Mihai Croitoru, MD   1 mg at 11/04/12 0935  . furosemide (LASIX) tablet 40 mg  40 mg Oral Daily Mihai Croitoru, MD   40 mg at 11/04/12 0935  . guaiFENesin-dextromethorphan (ROBITUSSIN DM) 100-10 MG/5ML syrup 5 mL  5 mL Oral Q4H PRN Renae Fickle, MD   5 mL at 11/04/12 2311  . levothyroxine (SYNTHROID,  LEVOTHROID) tablet 88 mcg  88 mcg Oral QAC breakfast Zannie Cove, MD   88 mcg at 11/05/12 [phone]  . metoprolol tartrate (LOPRESSOR) tablet 25 mg  25 mg Oral BID Renae Fickle, MD   25 mg at 11/04/12 2141  . polyethylene glycol (MIRALAX / GLYCOLAX) packet 17 g  17 g Oral Daily PRN Renae Fickle, MD      . warfarin (COUMADIN) tablet 1.25 mg  1.25 mg Oral ONCE-1800 Elease Etienne, MD      . Warfarin - Pharmacist Dosing Inpatient   Does not apply q1800 Drake Leach Rumbarger, RPH      . zolpidem (AMBIEN) tablet 5 mg  5 mg Oral QHS PRN Renae Fickle, MD        PE: General appearance: alert, cooperative and no distress Neck:  + hepatojugular reflux Lungs: Deminished BS bilaterally.  There appeared to be some improvement with cough, which produced white, frothy fluid. Heart: irregularly irregular rhythm, 2/6 sys MM. Extremities: No LEE Pulses: 2+ and symmetric Neurologic: Grossly normal  Lab Results:   Recent Labs  11/03/12 0415 11/04/12 0540  WBC 8.1 8.1  HGB 10.7* 10.6*  HCT 33.7* 33.1*  PLT 235 260   BMET  Recent Labs  11/03/12 0415 11/04/12 0540 11/05/12 1914  NA 138 139 137  K 4.7 4.3 4.1  CL 105 104 103  CO2 27 25 26   GLUCOSE 106* 104* 105*  BUN 28* 24* 22  CREATININE 0.99 1.08 0.89  CALCIUM 9.2 8.7 8.9   PT/INR  Recent Labs  11/03/12 0415 11/04/12 0540 11/05/12 0620  LABPROT 20.4* 22.5* 26.5*  INR 1.82* 2.08* 2.59*    Assessment/Plan   Principal Problem:   Systolic and diastolic CHF, acute on chronic Active Problems:   Paroxysmal atrial fibrillation, recurrent   Hypothyroidism   Coronary artery disease   Aortic valvular stenosis, moderate to severe   Hypotension, unspecified   Hypokalemia   Acute respiratory failure with hypoxia   Leukocytosis, unspecified   CAP (community acquired pneumonia)   Prolonged Q-T interval on ECG  Plan:  Continues in afib with a controlled vent rate.  INR therapeutic.  BP controlled.  Net fluids:  -1.1L/-4.5L.   She breathing does sound wet without use of a stethoscope.  CXR pending.  Will also check a BNP and see which way it is heading.  Back on PO lasix 40mg  daily.  May need an IV dose.  May be a combination of AS, Afib and diastolic dysfunction on top of CAP.     LOS: 6 days    HAGER, BRYAN 11/05/2012 9:12 AM  I have seen and evaluated the patient this AM along with Wilburt Finlay, PA. I agree with his findings, examination as well as impression recommendations.  proBNP is on its way down, but the supine coughing is a bit concerning for orthopnea -- will give an additional lasix dose today, but we must also consider potential aspiration when supine as the etiology of cough.  -- CXR does not suggest pulmonary edema (but would not necessarily show pulmonary vascular congestion)  Afib rate is stable on Amiodarone + BB. INR remains therapeutic.   BP stable.   MD Time with pt: 10 min  HARDING,DAVID W, M.D., M.S. [facility] [address] [place], [place]  [address]  [phone] Pager # [phone] 11/05/2012 11:54 AM

## 2012-11-05 NOTE — Progress Notes (Signed)
ANTICOAGULATION CONSULT NOTE - Follow Up Consult  Pharmacy Consult for Coumadin Indication: atrial fibrillation  Allergies  Allergen Reactions  . Sulfa Antibiotics Itching and Rash    Patient Measurements: Height: 5\' 2"  (157.5 cm) Weight: 135 lb 4.8 oz (61.372 kg) (Scale C) IBW/kg (Calculated) : 50.1  Vital Signs: Temp: 97.7 F (36.5 C) (06/12 0538) Temp src: Oral (06/12 0538) BP: 117/70 mmHg (06/12 0538) Pulse Rate: 83 (06/12 0538)  Labs:  Recent Labs  11/03/12 0415 11/04/12 0540 11/05/12 0620  HGB 10.7* 10.6*  --   HCT 33.7* 33.1*  --   PLT 235 260  --   LABPROT 20.4* 22.5* 26.5*  INR 1.82* 2.08* 2.59*  CREATININE 0.99 1.08 0.89    Estimated Creatinine Clearance: 38.4 ml/min (by C-G formula based on Cr of 0.89).   Medications:  Scheduled:  . amiodarone  200 mg Oral Daily  . aspirin EC  81 mg Oral Daily  . atorvastatin  40 mg Oral q1800  . azithromycin  250 mg Oral Q24H  . benzonatate  100 mg Oral TID  . cefTRIAXone (ROCEPHIN)  IV  1 g Intravenous Q24H  . ezetimibe  10 mg Oral Daily  . folic acid  1 mg Oral Daily  . furosemide  40 mg Oral Daily  . levothyroxine  88 mcg Oral QAC breakfast  . metoprolol  25 mg Oral BID  . Warfarin - Pharmacist Dosing Inpatient   Does not apply q1800    Assessment: 79 y/o female patient on chronic Coumadin for history of afib. INR therapeutic at 2.59.   Goal of Therapy:  INR 2-3   Plan:  Coumadin 1.25mg  x 1 today and continue with daily protimes.  Celedonio Miyamoto, PharmD, BCPS Clinical Pharmacist Pager [phone]   11/05/2012 8:49 AM

## 2012-11-05 NOTE — Progress Notes (Signed)
Physical Therapy Treatment Patient Details Name: [name] MRN: [id number] DOB: [birth year] Today's Date: 11/05/2012 Time: 8657-8469 PT Time Calculation (min): 23 min  PT Assessment / Plan / Recommendation Comments on Treatment Session  Pt making good progress with mobliity.  Ambulated ~140' with no AD due to she was not using any type of AD at home & she did well.  Pt also able to trial steps today.  Overall she is still min guard (A) for OOB mobility but she is doing well.  Encouraged pt to increase activity with Nsing by ambulating 2-3x's/day. Pt agreeable & RN made aware.      Follow Up Recommendations  Home health PT;Supervision for mobility/OOB     Does the patient have the potential to tolerate intense rehabilitation     Barriers to Discharge        Equipment Recommendations  Other (comment) (BSC for night time with toileting with lasix)    Recommendations for Other Services    Frequency Min 3X/week   Plan Discharge plan remains appropriate;Frequency remains appropriate    Precautions / Restrictions Precautions Precautions: Fall Restrictions Weight Bearing Restrictions: No       Mobility  Bed Mobility Bed Mobility: Supine to Sit;Sitting - Scoot to Edge of Bed Supine to Sit: 6: Modified independent (Device/Increase time) Sitting - Scoot to Edge of Bed: 6: Modified independent (Device/Increase time) Transfers Transfers: Sit to Stand;Stand to Sit Sit to Stand: 5: Supervision;With upper extremity assist;From bed Stand to Sit: 5: Supervision;With upper extremity assist;With armrests;To chair/3-in-1 Ambulation/Gait Ambulation/Gait Assistance: 4: Min guard Ambulation Distance (Feet): 140 Feet Assistive device: None Ambulation/Gait Assistance Details: Guarding for safety.  Pt with slow gait speed but steady.  She states she normally walks at faster pace but she is just being more cautious right now.   Gait Pattern: Step-through pattern;Decreased stride length Gait  velocity: decreased General Gait Details: 02 sats 90-91% RA.  Cues for pursed lip breathing.  Stairs: Yes Stairs Assistance: 4: Min guard Stair Management Technique: One rail Left;Step to pattern;Forwards Number of Stairs: 2 Wheelchair Mobility Wheelchair Mobility: No      PT Goals Acute Rehab PT Goals Time For Goal Achievement: 11/18/12 Potential to Achieve Goals: Good Pt will go Supine/Side to Sit: with modified independence PT Goal: Supine/Side to Sit - Progress: Met Pt will go Sit to Stand: with modified independence;with upper extremity assist PT Goal: Sit to Stand - Progress: Progressing toward goal Pt will Ambulate: >150 feet;with modified independence;with least restrictive assistive device PT Goal: Ambulate - Progress: Progressing toward goal Pt will Go Up / Down Stairs: 1-2 stairs;with modified independence PT Goal: Up/Down Stairs - Progress: Progressing toward goal Pt will Perform Home Exercise Program: Independently  Visit Information  Last PT Received On: 11/05/12 Assistance Needed: +1    Subjective Data      Cognition  Cognition Arousal/Alertness: Awake/alert Behavior During Therapy: WFL for tasks assessed/performed Overall Cognitive Status: Within Functional Limits for tasks assessed    Balance  Static Standing Balance Static Standing - Balance Support: No upper extremity supported Static Standing - Level of Assistance: 5: Stand by assistance Static Standing - Comment/# of Minutes: 2 mins while donninng<>doffing night gown.    End of Session PT - End of Session Equipment Utilized During Treatment: Gait belt Activity Tolerance: Patient tolerated treatment well Patient left: in chair;with call bell/phone within reach Nurse Communication: Mobility status     Verdell Face, [place] [phone] 11/05/2012

## 2012-11-05 NOTE — Progress Notes (Signed)
[facility] PROGRESS NOTE  [name] ZOX:[id number] DOB: 09/22/1923 DOA: 10/30/2012 PCP: Carollee Herter, MD  Assessment/Plan  Acute Hypoxic respiratory failure most likely due to combination of acute on chronic diastolic heart failure and underlying community-acquired pneumonia.  Acute and chronic diastolic CHF, primarily from valvular heart disease.   - moderate to severe aortic stenosis on echo 1/13  - Repeat 2-D echocardiogram EF of 55-60% and grade 2 diastolic dysfunction. Evidence of elevated mean left atrial filling pressure. - Diuresis per cardiology, diuretics were temporarily held and resumed by cardiology on 6/11. Has symptoms suggestive of Orthopnea (cough when supine) & made need increase in diuretics?IV -  Weaned off Oxygen- saturating > 90% on RA even with activity. - Repeat proBNP 1345 and chest x-ray may be due to pleural parenchymal scarring. Difficult to exclude mild left lower lobe air space disease and small left pleural effusion.   Acute on chronic diastolic and systolic CHF Improving. Denies dyspnea but does complain of some Orthopnea. Continue Lasix and management per cardiology- may need IV lasix.  Community-acquired pneumonia -  Day 4/5 Azithromycin and day 5/7 Rocephin. -  Continue tessalon and robitussin - Will need CXR FU to resolution.  HypoTN, blood pressure improved, but still soft -  Continue to hold Norvasc and hydralazine -  Hold parameter placed on metoprolol -  Diuretic management per cardiology  Moderate to severe aortic valve stenosis. Peak gradient of and mean gradient of , AVA of approximately 1cm2 per ECHO 2013 -  Per cardiology  Paroxysmal A. Fib -  Telemetry: Afib with CVR in 60's -  Continue metoprolol, Coumadin per pharmacy - therapeutically anticoagulated. -  hold amiodarone per cardiology recommendation due to prolonged QTC- resolved -  INR is therapeutic  Prolonged QTC -  Minimize QTC prolonging  medications. - Resolved.  history of CAD:  - Stable, EKG without acute ST-T wave changes  - Continue aspirin, metoprolol  - troponin x3 negative   Hypothyroidism: TSH 1.046 - continue Synthroid   Leukocytosis, resolved after initiation of antibiotics  Hypokalemia, likely due to Lasix, resolved, potassium now rising in the setting of no diuresis - Discontinued potassium supplements  Constipation -  Continue MiraLax  Normocytic anemia, likely due to marrow suppression from acute illness. Stable.  Diet:  Healthy heart, 2 g sodium diet Access:  PIV IVF:  Off Proph: Coumadin, INR near therapeutic  Code Status:DNR  Family Communication:none at bedside  Disposition Plan: Continue telemetry. Not ready for DC..    Consultants:  [facility]  Procedures:  Echo  Antibiotics:  Azithromycin 6/8 >  IV Rocephin 6/8 >  HPI/Subjective: Denies dyspnea but still has wet cough on lying down.  Objective: Filed Vitals:   11/04/12 1500 11/04/12 2052 11/05/12 0538 11/05/12 0900  BP: 97/54 109/75 117/70 100/62  Pulse: 71 87 83   Temp: 97.9 F (36.6 C) 97.3 F (36.3 C) 97.7 F (36.5 C)   TempSrc: Oral Oral Oral   Resp: 20 18 19    Height:      Weight:   61.372 kg (135 lb 4.8 oz)   SpO2: 94% 95% 95%     Intake/Output Summary (Last 24 hours) at 11/05/12 1129 Last data filed at 11/05/12 1031  Gross per 24 hour  Intake    890 ml  Output   1902 ml  Net  -1012 ml   Filed Weights   11/03/12 0500 11/04/12 0445 11/05/12 0538  Weight: 62.4 kg (137 lb 9.1 oz) 63.322  kg (139 lb 9.6 oz) 61.372 kg (135 lb 4.8 oz)    Exam:   General:  Caucasian female  No acute distress. .  Cardiovascular:  Irregularly irregular, nl S1, S2, 3/6 systolic ejection murmur at the right upper sternal border also heard at the left sternal border, , 2+ pulses, warm extremities. Telemetry A. fib with controlled ventricular rate in the 60s.  Respiratory:  few basal crackles &  reduced BS's in bases but otherwise clear to auscultation. No increased work of breathing.  Abdomen:   Normal bowel sounds heard, soft, NT/ND  MSK:   Normal tone and bulk, 1+ LEE  Neuro:  Grossly intact. Alert and oriented x3  Data Reviewed: Basic Metabolic Panel:  Recent Labs Lab 11/01/12 0513 11/02/12 0340 11/03/12 0415 11/04/12 0540 11/05/12 0620  NA 138 137 138 139 137  K 3.3* 4.1 4.7 4.3 4.1  CL 101 101 105 104 103  CO2 27 26 27 25 26   GLUCOSE 112* 94 106* 104* 105*  BUN 32* 30* 28* 24* 22  CREATININE 1.38* 1.07 0.99 1.08 0.89  CALCIUM 9.0 8.8 9.2 8.7 8.9   Liver Function Tests: No results found for this basename: AST, ALT, ALKPHOS, BILITOT, PROT, ALBUMIN,  in the last 168 hours No results found for this basename: LIPASE, AMYLASE,  in the last 168 hours No results found for this basename: AMMONIA,  in the last 168 hours CBC:  Recent Labs Lab 10/30/12 0304 10/31/12 0405 11/01/12 1034 11/02/12 0340 11/03/12 0415 11/04/12 0540  WBC 15.8* 15.0* 17.6* 11.3* 8.1 8.1  NEUTROABS 13.7*  --   --   --   --   --   HGB 12.5 11.5* 12.3 10.9* 10.7* 10.6*  HCT 38.3 35.0* 36.4 34.1* 33.7* 33.1*  MCV 93.4 90.0 90.3 90.5 92.3 90.7  PLT 268 239 225 226 235 260   Cardiac Enzymes:  Recent Labs Lab 10/30/12 0304 10/30/12 0812  TROPONINI <0.30 <0.30   BNP (last 3 results)  Recent Labs  11/01/12 0513 11/04/12 0540 11/05/12 0620  PROBNP 3462.0* 1379.0* 1345.0*   CBG: No results found for this basename: GLUCAP,  in the last 168 hours  No results found for this or any previous visit (from the past 240 hour(s)).   Studies: Dg Chest 2 View  11/05/2012   *RADIOLOGY REPORT*  Clinical Data: Shortness of breath, cough, pneumonia.  CHEST - 2 VIEW  Comparison: 11/04/2012 and 01/05/2012.  Findings: Trachea is midline.  Heart size normal.  Biapical pleural parenchymal scarring.  Opacity at the left costophrenic angle may be chronic, when compared with 01/05/2012. Minimal linear  opacification at the left lung base.  Lungs are hyperinflated but otherwise clear.  Left shoulder arthroplasty.  IMPRESSION:  When compared with 01/05/2012, findings at the base of the left hemithorax may be due to pleural parenchymal scarring.  Difficult to exclude mild left lower lobe air space disease and small left pleural effusion.   Original Report Authenticated By: Leanna Battles, M.D.   Dg Chest 2 View  11/04/2012   *RADIOLOGY REPORT*  Clinical Data: Cough, congestion and shortness of breath.  CHEST - 2 VIEW  Comparison: Chest x-ray 11/01/2012.  Findings: Lungs appear hyperexpanded with flattening of the hemidiaphragms, increased retrosternal air space and pruning of the pulmonary vasculature in the periphery, suggestive of underlying COPD.  Ill-defined opacity in the medial aspect of the left base is favored to reflect subsegmental atelectasis, although underlying air space consolidation is difficult to entirely exclude.  Small left  pleural effusion.  No evidence of pulmonary edema.  Bilateral apical pleuroparenchymal thickening, most compatible with chronic scarring.  Atherosclerosis in the thoracic aorta.  Upper mediastinal contours are otherwise within normal limits.  IMPRESSION: 1.  Minimal subsegmental atelectasis in the left lower lobe with small left pleural effusion. 2.  Atherosclerosis. 3.  Chronic changes of COPD redemonstrated.   Original Report Authenticated By: Trudie Reed, M.D.    Scheduled Meds: . amiodarone  200 mg Oral Daily  . aspirin EC  81 mg Oral Daily  . atorvastatin  40 mg Oral q1800  . azithromycin  250 mg Oral Q24H  . benzonatate  100 mg Oral TID  . cefTRIAXone (ROCEPHIN)  IV  1 g Intravenous Q24H  . ezetimibe  10 mg Oral Daily  . folic acid  1 mg Oral Daily  . furosemide  40 mg Oral Daily  . levothyroxine  88 mcg Oral QAC breakfast  . metoprolol  25 mg Oral BID  . warfarin  1.25 mg Oral ONCE-1800  . Warfarin - Pharmacist Dosing Inpatient   Does not apply  q1800   Continuous Infusions:   Principal Problem:   Systolic and diastolic CHF, acute on chronic Active Problems:   Paroxysmal atrial fibrillation, recurrent   Hypothyroidism   Coronary artery disease   Aortic valvular stenosis, moderate to severe   Hypotension, unspecified   Hypokalemia   Acute respiratory failure with hypoxia   Leukocytosis, unspecified   CAP (community acquired pneumonia)   Prolonged Q-T interval on ECG    Time spent: 20 min    [facility]  [facility] Pager [phone]. If 7PM-7AM, please contact night-coverage at [email or web address], password [facility] 11/05/2012, 11:29 AM  LOS: 6 days

## 2012-11-06 LAB — BASIC METABOLIC PANEL
BUN: 24 mg/dL — ABNORMAL HIGH (ref 6–23)
Calcium: 8.8 mg/dL (ref 8.4–10.5)
GFR calc Af Amer: 65 mL/min — ABNORMAL LOW (ref >90)
GFR calc non Af Amer: 56 mL/min — ABNORMAL LOW (ref >90)
Glucose, Bld: 94 mg/dL (ref 70–99)
Sodium: 135 mEq/L (ref 135–145)

## 2012-11-06 LAB — PROTIME-INR: Prothrombin Time: 26.8 seconds — ABNORMAL HIGH (ref 11.6–15.2)

## 2012-11-06 MED ORDER — CEFUROXIME AXETIL 500 MG PO TABS: 500.0000 mg | ORAL_TABLET | Freq: Two times a day (BID) | ORAL | Status: DC

## 2012-11-06 MED ORDER — AMIODARONE HCL 200 MG PO TABS: 200.0000 mg | ORAL_TABLET | Freq: Every day | ORAL | Status: DC

## 2012-11-06 MED ORDER — BENZONATATE 100 MG PO CAPS: 100.0000 mg | ORAL_CAPSULE | Freq: Three times a day (TID) | ORAL | Status: DC | PRN

## 2012-11-06 MED ORDER — WARFARIN SODIUM 2.5 MG PO TABS
2.5000 mg | ORAL_TABLET | Freq: Once | ORAL | Status: DC
  Filled 2012-11-06: qty 1

## 2012-11-06 NOTE — Progress Notes (Signed)
Patient evaluated for long-term disease management services with [facility] Care Management Program. Explained [facility] Care Management services at bedside. However, Mrs Wallick pleasantly declined. Left [facility] Care Management brochure for patient to call in case she changes her mind in the future.  Raiford Noble, MSN-Ed, RN,BSN, [facility], [phone]

## 2012-11-06 NOTE — Progress Notes (Signed)
Speech BSE completed, Full report to follow.  Pt with negative cranial nerve exam for oropharyngeal swallow musculature.  Observed pt self feeding graham cracker, applesauce and soda.  Pt noted to have baseline cough that is not worsened with intake per her report and SLP observation.  Swallow is timely with clear voice throughout.   Reflux Symptom Index administered with pt scoring 14/45 - on low side of indicating laryngopharyngeal reflux per screen authors.  Pt denies reflux or dysphagia symptoms.  Note per cardiology pt may be given a PPI and instructed to sleep with head of bed elevated.  Pt informed [name] that she sleeps on two pillows currently.  Rec continue regular diet with general aspiration precautions.  Educated pt to aspiration precautions and s/s to watch for that may indicate further testing is needed.  Pt report this is her first pna that she can recall having.  Given absence of neuro hx, would suspect if pt is having aspiration, esophageal issues may be the culprit.    Thanks for this consult. Donavan Burnet, MS [facility] SLP [phone]

## 2012-11-06 NOTE — Progress Notes (Signed)
ANTICOAGULATION CONSULT NOTE - Follow Up Consult  Pharmacy Consult for Coumadin Indication: atrial fibrillation  Allergies  Allergen Reactions  . Sulfa Antibiotics Itching and Rash    Patient Measurements: Height: 5\' 2"  (157.5 cm) Weight: 136 lb 4.8 oz (61.825 kg) IBW/kg (Calculated) : 50.1  Vital Signs: Temp: 98.4 F (36.9 C) (06/13 0637) Temp src: Oral (06/13 0637) BP: 132/66 mmHg (06/13 0637) Pulse Rate: 93 (06/13 0637)  Labs:  Recent Labs  11/04/12 0540 11/05/12 0620 11/06/12 0445  HGB 10.6*  --   --   HCT 33.1*  --   --   PLT 260  --   --   LABPROT 22.5* 26.5* 26.8*  INR 2.08* 2.59* 2.63*  CREATININE 1.08 0.89 0.90    Estimated Creatinine Clearance: 38.1 ml/min (by C-G formula based on Cr of 0.9).   Medications:  Scheduled:  . amiodarone  200 mg Oral Daily  . aspirin EC  81 mg Oral Daily  . atorvastatin  40 mg Oral q1800  . benzonatate  100 mg Oral TID  . cefTRIAXone (ROCEPHIN)  IV  1 g Intravenous Q24H  . ezetimibe  10 mg Oral Daily  . folic acid  1 mg Oral Daily  . furosemide  40 mg Oral Daily  . levothyroxine  88 mcg Oral QAC breakfast  . metoprolol  25 mg Oral BID  . Warfarin - Pharmacist Dosing Inpatient   Does not apply q1800    Assessment: 79 y/o female patient on chronic Coumadin for history of afib. INR therapeutic at 2.63.   Goal of Therapy:  INR 2-3   Plan:  Coumadin 2.5mg  x 1 today and continue with daily protimes.   Celedonio Miyamoto, PharmD, BCPS Clinical Pharmacist Pager [phone]   11/06/2012 10:10 AM

## 2012-11-06 NOTE — Discharge Summary (Signed)
Physician Discharge Summary  [name] ZOX:[id number] DOB: June 18, 1923 DOA: 10/30/2012  PCP: Carollee Herter, MD  Admit date: 10/30/2012 Discharge date: 11/06/2012  Time spent: Greater than 30 minutes  Recommendations for Outpatient Follow-up:  1. Ms. Nada Boozer, [facility] Cardiology on 11/11/12 with repeat labs (INR). 2. Dr. Sharlot Gowda, PCP in 1 week 3. Recommend followup chest x-ray 3 weeks to ensure resolution of pneumonia findings. 4. Home health PT 5. Evaluation of microscopic hematuria as outpatient, as deemed necessary.  Discharge Diagnoses:  Principal Problem:   Systolic and diastolic CHF, acute on chronic Active Problems:   Paroxysmal atrial fibrillation, recurrent   Hypothyroidism   Coronary artery disease   Aortic valvular stenosis, moderate to severe   Hypotension, unspecified   Hypokalemia   Acute respiratory failure with hypoxia   Leukocytosis, unspecified   CAP (community acquired pneumonia)   Prolonged Q-T interval on ECG   Discharge Condition: Improved & Stable  Diet recommendation: Heart healthy diet  Filed Weights   11/04/12 0445 11/05/12 0538 11/06/12 0637  Weight: 63.322 kg (139 lb 9.6 oz) 61.372 kg (135 lb 4.8 oz) 61.825 kg (136 lb 4.8 oz)    History of present illness:  79 year old female with PMH of CAD, PCI, recurrent PAF, prior cardioversions, HTN, moderate-severe aortic stenosis, hypothyroidism, and acute on chronic systolic and diastolic CHF,? COPD, was admitted on 10/30/12 with complaints of worsening dyspnea and wheezing. She was assessed to have decompensated CHF and community acquired pneumonia.  Hospital Course:  1. Acute hypoxic respiratory failure: Secondary to decompensated CHF and community acquired pneumonia. Treated for underlying conditions. Hypoxia has resolved. 2. Acute on chronic systolic and diastolic CHF: Cardiology consulted. Patient was initially treated with IV Lasix and then transitioned to oral Lasix. Up to 48 hours ago,  patient complained of cough mainly on lying down. This was felt to be orthopnea equivalent. She received additional dose of Lasix yesterday with good effect. Speech therapy performed following valve and recommend continued regular diet and thin liquids. Her cough has significantly improved and she slept well last night. Cardiology has cleared her for discharge. 2-D echo showed EF 55-60% and grade 2 diastolic dysfunction. 3. Community acquired pneumonia: Treated with IV Rocephin and azithromycin. She will be discharged on oral Ceftin to complete total 7 days of antibiotics. Patient denies cough or dyspnea. Recommend repeating chest x-ray in a couple of weeks to ensure resolution of pneumonia findings. 4. Hypertension/hypotension: Patient's Norvasc, hydralazine, ARB continue to be held with normal blood pressures. Continue metoprolol. Outpatient followup with cardiology. 5. Moderate-severe aortic stenosis: Outpatient followup with cardiology. 6. Paroxysmal A. fib: Controlled ventricular rate. Therapeutically anticoagulated. Continue amiodarone. Outpatient followup with cardiology. 7. Prolonged QTC: Possibly secondary to medications. Resolved. 8. History of CAD: Asymptomatic of chest pain. EKG without acute changes and troponins x3 negative. Continue aspirin and metoprolol. 9. Hypothyroidism: TSH 1.046. Continue Synthroid. 10. Hypokalemia: Repleted. 11. Normocytic anemia: Stable. Outpatient followup.  Procedures:  None   Consultations:  Cardiology  Speech therapy  Discharge Exam:  Complaints: Patient denies any further cough. Indicates that she slept well last night. No dyspnea.  Filed Vitals:   11/05/12 0900 11/05/12 1301 11/05/12 2124 11/06/12 0637  BP: 100/62 106/60 110/58 132/66  Pulse:  66 83 93  Temp:  98 F (36.7 C) 97.6 F (36.4 C) 98.4 F (36.9 C)  TempSrc:  Oral Oral Oral  Resp:  20 20 18   Height:      Weight:    61.825 kg (136 lb 4.8  oz)  SpO2:  93% 93% 94%      General exam: Comfortable. Lying supine in bed  Respiratory system: Slightly reduced breath sounds in the bases with occasional basal crackles but otherwise clear to auscultation. No increased work of breathing.  Cardiovascular system: S1 and S2 heard, irregularly irregular. No JVD, murmurs. Trace pedal edema. Telemetry: A. fib with controlled ventricular rate in the 60s.  Gastrointestinal system: Abdomen is nondistended, soft and nontender. Normal bowel sounds heard.  Central nervous system: Alert and oriented. No focal neurological deficits.  Extremities: Symmetric 5 x 5 power.  Discharge Instructions      Discharge Orders   Future Appointments Provider Department Dept Phone   11/11/2012 1:30 PM Phillips Hay, RPH-CPP [facility] [place] [phone]   11/11/2012 1:40 PM Nada Boozer, NP [facility] [place] [phone]   11/24/2012 9:20 AM Phillips Hay, RPH-CPP [facility] [place] [phone]   Future Orders Complete By Expires     (HEART FAILURE PATIENTS) Call MD:  Anytime you have any of the following symptoms: 1) 3 pound weight gain in 24 hours or 5 pounds in 1 week 2) shortness of breath, with or without a dry hacking cough 3) swelling in the hands, feet or stomach 4) if you have to sleep on extra pillows at night in order to breathe.  As directed     Call MD for:  difficulty breathing, headache or visual disturbances  As directed     Call MD for:  temperature >100.4  As directed     Diet - low sodium heart healthy  As directed     Increase activity slowly  As directed         Medication List    STOP taking these medications       amLODipine 5 MG tablet  Commonly known as:  NORVASC     COZAAR PO     hydrALAZINE 50 MG tablet  Commonly known as:  APRESOLINE      TAKE these medications       acetaminophen 650 MG CR tablet  Commonly known as:  TYLENOL  Take 650 mg by  mouth 3 (three) times daily.     amiodarone 200 MG tablet  Commonly known as:  PACERONE  Take 1 tablet (200 mg total) by mouth daily.     aspirin EC 81 MG tablet  Take 81 mg by mouth daily.     atorvastatin 40 MG tablet  Commonly known as:  LIPITOR  Take 40 mg by mouth daily.     benzonatate 100 MG capsule  Commonly known as:  TESSALON  Take 1 capsule (100 mg total) by mouth 3 (three) times daily as needed for cough.     cefUROXime 500 MG tablet  Commonly known as:  CEFTIN  Take 1 tablet (500 mg total) by mouth 2 (two) times daily.     CENTRUM SILVER tablet  Take 1 tablet by mouth daily.     ezetimibe 10 MG tablet  Commonly known as:  ZETIA  Take 10 mg by mouth daily.     folic acid 1 MG tablet  Commonly known as:  FOLVITE  Take 2 mg by mouth daily.     furosemide 40 MG tablet  Commonly known as:  LASIX  Take 40 mg by mouth daily.     levothyroxine 88 MCG tablet  Commonly known as:  SYNTHROID, LEVOTHROID  Take 88 mcg by mouth daily.  metoprolol 50 MG tablet  Commonly known as:  LOPRESSOR  Take 25 mg by mouth 2 (two) times daily.     warfarin 5 MG tablet  Commonly known as:  COUMADIN  Take 1.25-2.5 mg by mouth daily. Takes 2.5 mg (1/2 tab) on Sun tues, wed ,thurs and sat  And1.25 mg ( 1/4 tab) on Mon and friday       Follow-up Information   Follow up with [facility]. (Home health physical therapy)    Contact information:   [phone]      Follow up with [facility] R, NP On 11/11/2012. (1:40 pm)    Contact information:   [address] [place] [address] [phone]       Follow up with [facility] On 11/11/2012. (INR check - 1:40 pm will be done when you see Nada Boozer in follow-up)    Contact information:   [phone]      Follow up with Carollee Herter, MD. Schedule an appointment as soon as possible for a visit in 1 week.   Contact information:   [address] [place]  [address] [phone]        The results of significant diagnostics from this hospitalization (including imaging, microbiology, ancillary and laboratory) are listed below for reference.    Significant Diagnostic Studies: Dg Chest 2 View  11/05/2012   *RADIOLOGY REPORT*  Clinical Data: Shortness of breath, cough, pneumonia.  CHEST - 2 VIEW  Comparison: 11/04/2012 and 01/05/2012.  Findings: Trachea is midline.  Heart size normal.  Biapical pleural parenchymal scarring.  Opacity at the left costophrenic angle may be chronic, when compared with 01/05/2012. Minimal linear opacification at the left lung base.  Lungs are hyperinflated but otherwise clear.  Left shoulder arthroplasty.  IMPRESSION:  When compared with 01/05/2012, findings at the base of the left hemithorax may be due to pleural parenchymal scarring.  Difficult to exclude mild left lower lobe air space disease and small left pleural effusion.   Original Report Authenticated By: Leanna Battles, M.D.   Dg Chest 2 View  11/04/2012   *RADIOLOGY REPORT*  Clinical Data: Cough, congestion and shortness of breath.  CHEST - 2 VIEW  Comparison: Chest x-ray 11/01/2012.  Findings: Lungs appear hyperexpanded with flattening of the hemidiaphragms, increased retrosternal air space and pruning of the pulmonary vasculature in the periphery, suggestive of underlying COPD.  Ill-defined opacity in the medial aspect of the left base is favored to reflect subsegmental atelectasis, although underlying air space consolidation is difficult to entirely exclude.  Small left pleural effusion.  No evidence of pulmonary edema.  Bilateral apical pleuroparenchymal thickening, most compatible with chronic scarring.  Atherosclerosis in the thoracic aorta.  Upper mediastinal contours are otherwise within normal limits.  IMPRESSION: 1.  Minimal subsegmental atelectasis in the left lower lobe with small left pleural effusion. 2.  Atherosclerosis. 3.  Chronic changes of COPD  redemonstrated.   Original Report Authenticated By: Trudie Reed, M.D.   Dg Chest 2 View  11/01/2012   *RADIOLOGY REPORT*  Clinical Data: Increasing dyspnea.  Rales.  Productive sputum.  CHEST - 2 VIEW  Comparison: Chest x-ray 10/30/2012.  Findings: Hyperexpansion with flattening of the hemidiaphragms and increased retrosternal air space.  Small right pleural effusion. No definite acute consolidative airspace disease.  No evidence of pulmonary edema.  Heart size is normal.  Mild diffuse bronchial wall thickening appears be chronic and is similar to prior studies. Upper mediastinal contours are unremarkable.  Atherosclerosis of  the thoracic aorta.  Bilateral apical pleuroparenchymal thickening, similar to prior studies, most compatible with chronic scarring. Status post left shoulder arthroplasty.  IMPRESSION: 1.  Small right pleural effusion. 2.  Chronic changes of COPD redemonstrated. 3.  Atherosclerosis.   Original Report Authenticated By: Trudie Reed, M.D.   Dg Chest 2 View  10/30/2012   *RADIOLOGY REPORT*  Clinical Data: Shortness of breath.  CHEST - 2 VIEW  Comparison: Chest radiograph performed 01/05/2012  Findings: The lungs are hyperexpanded, with flattening of the hemidiaphragms, compatible with COPD.  Small bilateral pleural effusions are suspected, slightly more prominent on the right in comparison to the prior study.  Vascular congestion is seen, with minimal bibasilar airspace opacities.  This could reflect mild interstitial edema.  The appearance is less typical for pneumonia. No pneumothorax is identified.  The heart is mildly enlarged.  Calcification is noted within the aortic arch.  No acute osseous abnormalities are seen.  A left shoulder arthroplasty is grossly unremarkable in appearance.  IMPRESSION:  1.  Vascular congestion and mild cardiomegaly, with minimal bibasilar airspace opacities and small bilateral pleural effusions. This could reflect mild interstitial edema.  The appearance  is less typical for pneumonia. 2.  Findings of COPD.   Original Report Authenticated By: Tonia Ghent, M.D.    Microbiology: No results found for this or any previous visit (from the past 240 hour(s)).   Labs: Basic Metabolic Panel:  Recent Labs Lab 11/02/12 0340 11/03/12 0415 11/04/12 0540 11/05/12 0620 11/06/12 0445  NA 137 138 139 137 135  K 4.1 4.7 4.3 4.1 3.7  CL 101 105 104 103 99  CO2 26 27 25 26 26   GLUCOSE 94 106* 104* 105* 94  BUN 30* 28* 24* 22 24*  CREATININE 1.07 0.99 1.08 0.89 0.90  CALCIUM 8.8 9.2 8.7 8.9 8.8   Liver Function Tests: No results found for this basename: AST, ALT, ALKPHOS, BILITOT, PROT, ALBUMIN,  in the last 168 hours No results found for this basename: LIPASE, AMYLASE,  in the last 168 hours No results found for this basename: AMMONIA,  in the last 168 hours CBC:  Recent Labs Lab 10/31/12 0405 11/01/12 1034 11/02/12 0340 11/03/12 0415 11/04/12 0540  WBC 15.0* 17.6* 11.3* 8.1 8.1  HGB 11.5* 12.3 10.9* 10.7* 10.6*  HCT 35.0* 36.4 34.1* 33.7* 33.1*  MCV 90.0 90.3 90.5 92.3 90.7  PLT 239 225 226 235 260   Cardiac Enzymes: No results found for this basename: CKTOTAL, CKMB, CKMBINDEX, TROPONINI,  in the last 168 hours BNP: BNP (last 3 results)  Recent Labs  11/01/12 0513 11/04/12 0540 11/05/12 0620  PROBNP 3462.0* 1379.0* 1345.0*   CBG: No results found for this basename: GLUCAP,  in the last 168 hours  Additional labs:  INR 6/13:2.63  Pro calcitonin: 0.49  TSH 1.046   Signed:  Zuly Belkin  [facility] 11/06/2012, 1:48 PM

## 2012-11-06 NOTE — Progress Notes (Signed)
Patient ID: [name], female   DOB: 1923-12-31, 79 y.o.   MRN: [id number]  Subjective: She had a good night sleep last PM, was actually asleep the 1st 2 times I went in to see her. Cough has improved.  Objective: Vital signs in last 24 hours: Temp:  [97.6 F (36.4 C)-98.4 F (36.9 C)] 98.4 F (36.9 C) (06/13 0637) Pulse Rate:  [66-93] 93 (06/13 0637) Resp:  [18-20] 18 (06/13 0637) BP: (106-132)/(58-66) 132/66 mmHg (06/13 0637) SpO2:  [93 %-94 %] 94 % (06/13 0637) Weight:  [136 lb 4.8 oz (61.825 kg)] 136 lb 4.8 oz (61.825 kg) (06/13 0637) Last BM Date: 11/05/12  Intake/Output from previous day: 06/12 0701 - 06/13 0700 In: 1490 [P.O.:1490] Out: 2701 [Urine:2700; Stool:1] Intake/Output this shift: Total I/O In: 480 [P.O.:480] Out: 400 [Urine:400]  Medications Current Facility-Administered Medications  Medication Dose Route Frequency Provider Last Rate Last Dose  . acetaminophen (TYLENOL) tablet 650 mg  650 mg Oral Q6H PRN Zannie Cove, MD      . amiodarone (PACERONE) tablet 200 mg  200 mg Oral Daily Mihai Croitoru, MD   200 mg at 11/06/12 1048  . aspirin EC tablet 81 mg  81 mg Oral Daily Zannie Cove, MD   81 mg at 11/06/12 1047  . atorvastatin (LIPITOR) tablet 40 mg  40 mg Oral q1800 Zannie Cove, MD   40 mg at 11/05/12 1725  . benzonatate (TESSALON) capsule 100 mg  100 mg Oral TID Renae Fickle, MD   100 mg at 11/06/12 1048  . cefTRIAXone (ROCEPHIN) 1 g in dextrose 5 % 50 mL IVPB  1 g Intravenous Q24H Renae Fickle, MD   1 g at 11/05/12 1725  . ezetimibe (ZETIA) tablet 10 mg  10 mg Oral Daily Zannie Cove, MD   10 mg at 11/06/12 1048  . folic acid (FOLVITE) tablet 1 mg  1 mg Oral Daily Mihai Croitoru, MD   1 mg at 11/06/12 1048  . furosemide (LASIX) tablet 40 mg  40 mg Oral Daily Mihai Croitoru, MD   40 mg at 11/06/12 1048  . guaiFENesin-dextromethorphan (ROBITUSSIN DM) 100-10 MG/5ML syrup 5 mL  5 mL Oral Q4H PRN Renae Fickle, MD   5 mL at 11/05/12 2239    . levothyroxine (SYNTHROID, LEVOTHROID) tablet 88 mcg  88 mcg Oral QAC breakfast Zannie Cove, MD   88 mcg at 11/06/12 [phone]  . metoprolol tartrate (LOPRESSOR) tablet 25 mg  25 mg Oral BID Renae Fickle, MD   25 mg at 11/06/12 1047  . polyethylene glycol (MIRALAX / GLYCOLAX) packet 17 g  17 g Oral Daily PRN Renae Fickle, MD      . warfarin (COUMADIN) tablet 2.5 mg  2.5 mg Oral ONCE-1800 Elease Etienne, MD      . Warfarin - Pharmacist Dosing Inpatient   Does not apply q1800 Drake Leach Rumbarger, RPH      . zolpidem (AMBIEN) tablet 5 mg  5 mg Oral QHS PRN Renae Fickle, MD       PE: General appearance: alert, cooperative and no distress Neck:  + mild hepatojugular reflux, but no notable JVD Lungs: improved air movement, no W/R/R, mostly CTAB Heart: irregularly irregular rhythm, 2/6 sys MM. Extremities: No LEE, no venous stasis changes. Pulses: 2+ and symmetric Neurologic: Grossly normal  Lab Results:   Recent Labs  11/04/12 0540  WBC 8.1  HGB 10.6*  HCT 33.1*  PLT 260   BMET  Recent Labs  11/04/12 0540 11/05/12  3086 11/06/12 0445  NA 139 137 135  K 4.3 4.1 3.7  CL 104 103 99  CO2 25 26 26   GLUCOSE 104* 105* 94  BUN 24* 22 24*  CREATININE 1.08 0.89 0.90  CALCIUM 8.7 8.9 8.8   PT/INR  Recent Labs  11/04/12 0540 11/05/12 0620 11/06/12 0445  LABPROT 22.5* 26.5* 26.8*  INR 2.08* 2.59* 2.63*    Assessment/Plan   Principal Problem:   Systolic and diastolic CHF, acute on chronic Active Problems:   Paroxysmal atrial fibrillation, recurrent   Hypothyroidism   Coronary artery disease   Aortic valvular stenosis, moderate to severe   Hypotension, unspecified   Hypokalemia   Acute respiratory failure with hypoxia   Leukocytosis, unspecified   CAP (community acquired pneumonia)   Prolonged Q-T interval on ECG  Continues in afib with a controlled vent rate.  INR therapeutic.  BP controlled.  Net fluids:  -1.2L/-5.7L.   Breathing much improved --  CXR  without much evidence of pulmonary edema. BNP trending down.   Back on PO lasix 40mg  daily with additional PO dose given yesterday with brisk UOP -- this may lend credence to mild pulmonary vascular congestion being the cause of her coughing with lying flat.  Afib rate is stable on Amiodarone + BB. INR remains therapeutic.   BP stable.  Her cough improved with a combination of Robitussin & diuresis, however, I still think a swallow / aspiration evaluation is warranted for risk stratification as chronic aspiration is a set up for PNA, etc.  -- would consider PPI & elevating head of bed.  She seems to be doing well from a cardiology standpoint & is likely ready for d/c on current dose of diuretic & other meds.  We will arrange the equivalent of TCM d/c f/u with an APP @ [facility] -- see f/u portion of AVS for details when done.  This will also include INR check.   LOS: 7 days   MD Time with pt: 10 min   Van Seymore W, M.D., M.S. [facility] [address] [place], [place]  [address]  [phone] Pager # [phone] 11/06/2012 11:07 AM

## 2012-11-06 NOTE — Evaluation (Signed)
Clinical/Bedside Swallow Evaluation Patient Details  Name: [name] MRN: [id number] Date of Birth: [birth year]  Today's Date: 11/06/2012 Time: 7829-5621 SLP Time Calculation (min): 26 min  Past Medical History:  Past Medical History  Diagnosis Date  . CHF (congestive heart failure)   . Hypothyroidism   . Dyslipidemia   . Hypertension   . PAF (paroxysmal atrial fibrillation)   .  Acute respiratiory failure requiring BiPap 06/24/2011  . Aortic valvular stenosis, moderate to severe 06/24/2011  . Atrial fibrillation   . Allergy   . Heart murmur   . Coronary artery disease   . Exertional shortness of breath   . Arthritis     "back" (10/30/2012)   Past Surgical History:  Past Surgical History  Procedure Laterality Date  . Cardioversion  06/19/2011    Procedure: CARDIOVERSION;  Surgeon: Thurmon Fair, MD;  Location: [facility] OR;  Service: Cardiovascular;  Laterality: N/A;  . Appendectomy    . Angioplasty    . Cataract extraction w/ intraocular lens  implant, bilateral    . Lumbar disc surgery      "had to go in twice and clean out scar tissue" (10/30/2012)  . Cardiac catheterization    . Coronary angioplasty with stent placement      "1" (10/30/2012)  . Dilation and curettage of uterus    . Excisional hemorrhoidectomy    . Wrist fracture surgery Left   . Shoulder acromioplasty Left     "fell; put a new ball in" (10/30/2012)   HPI:  [name] 10/30/12 with cough, found to have CAP, CHF.  CXR 6/12 can not exlude mild left lower air space disease and left pleural effusion.  Swallow evaluation was ordered due to concerns for possible aspiration.  Pt report cough when lying down, denies reflux or dysphagia symptoms.     Assessment / Plan / Recommendation Clinical Impression  Pt presents with functional swallow, no focal CN deficit that would impact oropharyngeal swallow function.  Pt did have baseline cough and did cough 3 times during po - however do not suspect this is due to  aspiration but cough d/t pulmonary issue.  Observed pt self feeding graham cracker, applesauce and soda.  Swallow is timely with clear voice throughout.   Reflux Symptom Index administered with pt scoring 14/45 - on low side of indicating laryngopharyngeal reflux per screen authors.  Pt denies reflux or dysphagia symptoms.  Note per cardiology pt may be given a PPI and instructed to sleep with head of bed elevated.  Pt informed [name] that she sleeps on two pillows currently.    Rec continue regular diet with general aspiration precautions.  Educated pt to aspiration precautions and s/s to watch for that may indicate further testing is needed.  Pt report this is her first pna that she can recall having.    Given absence of neuro hx, would suspect if pt is having aspiration, esophageal issues may be the culprit, however pt denies any symptoms of reflux.  No further slp warranted as all education completed.  Thanks for referral.     Aspiration Risk  Mild    Diet Recommendation Regular;Thin liquid   Liquid Administration via: Cup;Straw Medication Administration: Whole meds with liquid Supervision: Patient able to self feed Compensations: Slow rate;Small sips/bites Postural Changes and/or Swallow Maneuvers: Seated upright 90 degrees;Upright 30-60 min after meal    Other  Recommendations Oral Care Recommendations: Oral care QID   Follow Up Recommendations  None  Frequency and Duration   n/a     Pertinent Vitals/Pain Afebrile, decreased - crackles     SLP Swallow Goals  n/a   Swallow Study Prior Functional Status   no dysphagia per pt    General HPI: 79 yo female adm to [facility] 10/30/12 with cough, found to have CAP, CHF.  CXR 6/12 can not exlude mild left lower air space disease and left pleural effusion.  Swallow evaluation was ordered due to concerns for possible aspiration.  Pt report cough when lying down, denies reflux or dysphagia symptoms.   Type of Study: Bedside swallow  evaluation Diet Prior to this Study: Regular;Thin liquids Temperature Spikes Noted: No Respiratory Status: Room air Behavior/Cognition: Alert;Cooperative;Pleasant mood Oral Cavity - Dentition: Adequate natural dentition Self-Feeding Abilities: Able to feed self Patient Positioning: Upright in chair Baseline Vocal Quality: Clear Volitional Cough: Strong Volitional Swallow: Able to elicit    Oral/Motor/Sensory Function Overall Oral Motor/Sensory Function: Appears within functional limits for tasks assessed   Ice Chips Ice chips: Not tested   Thin Liquid Thin Liquid: Within functional limits Presentation: Self Fed;Cup;Straw Other Comments: cough x1 with sequential swallows of sprite, but also cough at baseline - do not suspect asp related but instructed pt to take small bites/sips    Nectar Thick Nectar Thick Liquid: Not tested   Honey Thick Honey Thick Liquid: Not tested   Puree Puree: Within functional limits Presentation: Self Fed;Spoon   Solid   GO    Solid: Within functional limits Presentation: Self Lisabeth Pick, MS [facility] SLP [phone]

## 2012-11-10 ENCOUNTER — Encounter: Payer: Self-pay | Admitting: Cardiovascular Disease

## 2012-11-11 ENCOUNTER — Encounter: Payer: Self-pay | Admitting: Physician Assistant

## 2012-11-11 ENCOUNTER — Ambulatory Visit (INDEPENDENT_AMBULATORY_CARE_PROVIDER_SITE_OTHER): Payer: Medicare Other | Admitting: Pharmacist Clinician (PhC)/ Clinical Pharmacy Specialist

## 2012-11-11 ENCOUNTER — Ambulatory Visit: Payer: Medicare Other | Admitting: Cardiology

## 2012-11-11 ENCOUNTER — Ambulatory Visit (INDEPENDENT_AMBULATORY_CARE_PROVIDER_SITE_OTHER): Payer: Medicare Other | Admitting: Physician Assistant

## 2012-11-11 ENCOUNTER — Ambulatory Visit: Payer: Medicare Other | Admitting: Pharmacist Clinician (PhC)/ Clinical Pharmacy Specialist

## 2012-11-11 VITALS — BP 110/72 | HR 81 | Ht 63.0 in | Wt 134.7 lb

## 2012-11-11 DIAGNOSIS — I5043 Acute on chronic combined systolic (congestive) and diastolic (congestive) heart failure: Secondary | ICD-10-CM

## 2012-11-11 DIAGNOSIS — I4891 Unspecified atrial fibrillation: Secondary | ICD-10-CM

## 2012-11-11 DIAGNOSIS — R05 Cough: Secondary | ICD-10-CM

## 2012-11-11 DIAGNOSIS — I509 Heart failure, unspecified: Secondary | ICD-10-CM

## 2012-11-11 DIAGNOSIS — I480 Paroxysmal atrial fibrillation: Secondary | ICD-10-CM

## 2012-11-11 DIAGNOSIS — E785 Hyperlipidemia, unspecified: Secondary | ICD-10-CM

## 2012-11-11 DIAGNOSIS — I10 Essential (primary) hypertension: Secondary | ICD-10-CM

## 2012-11-11 DIAGNOSIS — Z7901 Long term (current) use of anticoagulants: Secondary | ICD-10-CM

## 2012-11-11 LAB — POCT INR: INR: 2.7

## 2012-11-11 MED ORDER — BENZONATATE 100 MG PO CAPS: 100.0000 mg | ORAL_CAPSULE | Freq: Three times a day (TID) | ORAL | Status: DC | PRN

## 2012-11-11 NOTE — Assessment & Plan Note (Addendum)
Patient appears to be well compensated with no signs of acute heart failure and with no complaints other than a persistent cough. Continue on 40 mg of Lasix.

## 2012-11-11 NOTE — Assessment & Plan Note (Signed)
Treated with zetia and Lipitor

## 2012-11-11 NOTE — Assessment & Plan Note (Signed)
Blood pressure well controlled at this time. 

## 2012-11-11 NOTE — Assessment & Plan Note (Signed)
Patient is due to follow up with her primary care provider this Friday. In the meantime, I have renewed her prescription for Tessalon Perles.

## 2012-11-11 NOTE — Progress Notes (Addendum)
Date:  11/11/2012   ID:  [name], DOB [birth year], MRN [id number]  PCP:  Carollee Herter, MD  Primary Cardiologist:  Tresa Endo   History of Present Illness: [name] is a 79 y.o. female who now sees Dr. Royann Shivers. She has knonw coronary artery disease and history of percutaneous revascularization of the right coronary artery (3.0-x-18-mm nondrug-eluting Integrity stent placed in 2010), recurrent paroxysmal atrial fibrillation, requiring cardioversion twice in the last 2 years, moderate aortic stenosis, hyperlipidemia, hypertension, and treated hypothyroidism. She initially presented to [facility] on 10/30/12 with a complaint of acute onset of dyspnea with wheezing, and a persistent dry cough. Work-up revealed acute on chronic CHF. Her BNP was 900 and her CXR demonstrated pulmonary vascular congestion. She was admitted by [facility] and was administered IV Lasix. She initially had improvement in her dyspnea after diuresing a total of 1.6L. She was also treated for community-acquired pneumonia.  2-D echo current cardiogram showed an ejection fraction of 55-60% grade 2 diastolic dysfunction.    Patient presents today for followup to her hospitalization. She will reports a persistent productive cough which does not allow her to lay down. She denies shortness of breath or orthopnea but does maintain a minimal lower extremity edema. Patient has been tracking her weight since her discharge she remained stable over the last 5 days.  She continues to take Robitussin and Jerilynn Som has not noticed a significant improvement in her cough.  She otherwise denies nausea, vomiting, fever, chest pain, dizziness, PND, abdominal pain, hematochezia,  Dysuria, hematuria, melena.   Wt Readings from Last 3 Encounters:  11/11/12 134 lb 11.2 oz (61.1 kg)  11/06/12 136 lb 4.8 oz (61.825 kg)  07/19/12 139 lb (63.05 kg)     Past Medical History  Diagnosis Date  . CHF (congestive heart failure)     2D ECHO, 06/21/2011  - EF 55-60%  . Hypothyroidism   . Dyslipidemia   . Hypertension   . PAF (paroxysmal atrial fibrillation)   .  Acute respiratiory failure requiring BiPap 06/24/2011  . Aortic valvular stenosis, moderate to severe 06/24/2011  . Atrial fibrillation   . Allergy   . Heart murmur   . Coronary artery disease   . Exertional shortness of breath   . Arthritis     "back" (10/30/2012)    Current Outpatient Prescriptions  Medication Sig Dispense Refill  . acetaminophen (TYLENOL) 650 MG CR tablet Take 1,950 mg by mouth daily.       Marland Kitchen amiodarone (PACERONE) 200 MG tablet Take 1 tablet (200 mg total) by mouth daily.  30 tablet  0  . aspirin EC 81 MG tablet Take 81 mg by mouth daily.      Marland Kitchen atorvastatin (LIPITOR) 40 MG tablet Take 40 mg by mouth daily.       . benzonatate (TESSALON) 100 MG capsule Take 1 capsule (100 mg total) by mouth 3 (three) times daily as needed for cough.  20 capsule  0  . ezetimibe (ZETIA) 10 MG tablet Take 10 mg by mouth daily.        . folic acid (FOLVITE) 1 MG tablet Take 2 mg by mouth daily.       . furosemide (LASIX) 40 MG tablet Take 40 mg by mouth daily.       Marland Kitchen levothyroxine (SYNTHROID, LEVOTHROID) 88 MCG tablet Take 88 mcg by mouth daily.       . metoprolol (LOPRESSOR) 50 MG tablet Take 25 mg by mouth 2 (two)  times daily.      . Multiple Vitamins-Minerals (CENTRUM SILVER) tablet Take 1 tablet by mouth daily.        . potassium chloride SA (K-DUR,KLOR-CON) 20 MEQ tablet Take 20 mEq by mouth daily. Patient takes 1/2 tablet      . warfarin (COUMADIN) 5 MG tablet Take 1.25-2.5 mg by mouth daily. Takes 2.5 mg (1/2 tab) on Sun tues, wed ,thurs and sat  And1.25 mg ( 1/4 tab) on Mon and friday       No current facility-administered medications for this visit.    Allergies:    Allergies  Allergen Reactions  . Sulfa Antibiotics Itching and Rash    Social History:  The patient  reports that she has never smoked. She has never used smokeless tobacco. She reports that she does  not drink alcohol or use illicit drugs.   Family History  Problem Relation Age of Onset  . Heart disease Brother   . Heart disease Brother   . Lung disease Brother   . Heart disease Mother 35  . Cancer Mother 47    Uterine  . Stroke Mother 25  . Heart disease Father   . Hypertension Child 34    ROS:  Please see the history of present illness.  All other systems reviewed and negative.   PHYSICAL EXAM: VS:  BP 110/72  Pulse 81  Ht 5\' 3"  (1.6 m)  Wt 134 lb 11.2 oz (61.1 kg)  BMI 23.87 kg/m2 Well-nourished, well developed, chronic O2 therapy in no acute distress HEENT: Pupils are equal round react to light accommodation extraocular movements are intact.  Neck: no JVDNo cervical lymphadenopathy. Cardiac: Irregular rate and rhythm with no murmurs Lungs:  Mild bilateral rhonchi. No Abd: soft, nontender, positive bowel sounds all quadrants,  Ext: No lower extremity edema.  2+ radial and dorsalis pedis pulses. Skin: warm and dry Neuro:  Grossly normal  EKG:  Atrial fibrillation rate 63 beats per minute    2-D echocardiogram, 11/01/2012 Study Conclusions  - Left ventricle: The cavity size was normal. Wall thickness was normal. Systolic function was normal. The estimated ejection fraction was in the range of 55% to 60%. Wall motion was normal; there were no regional wall motion abnormalities. Features are consistent with a pseudonormal left ventricular filling pattern, with concomitant abnormal relaxation and increased filling pressure (grade 2 diastolic dysfunction). Doppler parameters are consistent with elevated mean left atrial filling pressure. - Aortic valve: Valve mobility was restricted. There was moderate stenosis. Valve area: 1.25cm^2(VTI). Valve area: 1.19cm^2 (Vmax). - Mitral valve: Calcified annulus. Mildly thickened leaflets . - Left atrium: The atrium was moderately dilated. - Right atrium: The atrium was mildly to moderately dilated. - Pericardium,  extracardiac: A trivial, free-flowing pericardial effusion was identified circumferential to the heart.   ASSESSMENT AND PLAN:  Problem List Items Addressed This Visit   Acute on chronic diastolic heart failure:  Last echo in June 2014     Patient appears to be well compensated with no signs of acute heart failure and with no complaints other than a persistent cough. Continue on 40 mg of Lasix.    Chronic cough     Patient is due to follow up with her primary care provider this Friday. In the meantime, I have renewed her prescription for Tessalon Perles.    HTN (hypertension)     Blood pressure well controlled at this time.    Hyperlipidemia     Treated with zetia and Lipitor  Paroxysmal atrial fibrillation, recurrent     Currently in atrial fibrillation and on Coumadin.  Rate is well controlled.     Other Visit Diagnoses   Cough    -  Primary    Relevant Medications       benzonatate (TESSALON) capsule    Atrial fibrillation        Relevant Orders       EKG 12-Lead

## 2012-11-11 NOTE — Patient Instructions (Signed)
We'll review your prescription for [employer].  Continue to monitor weight and call office if he gained 1-2 pounds in 24 hours or 5-6 pounds in one week.  A followup appointment be scheduled with Dr. see in 3 months

## 2012-11-11 NOTE — Assessment & Plan Note (Signed)
Currently in atrial fibrillation and on Coumadin.  Rate is well controlled.

## 2012-11-13 ENCOUNTER — Ambulatory Visit (INDEPENDENT_AMBULATORY_CARE_PROVIDER_SITE_OTHER): Payer: 59 | Admitting: Family Medicine

## 2012-11-13 ENCOUNTER — Encounter: Payer: Self-pay | Admitting: Family Medicine

## 2012-11-13 VITALS — BP 110/70 | HR 74 | Wt 139.0 lb

## 2012-11-13 DIAGNOSIS — J189 Pneumonia, unspecified organism: Secondary | ICD-10-CM

## 2012-11-13 DIAGNOSIS — I5033 Acute on chronic diastolic (congestive) heart failure: Secondary | ICD-10-CM

## 2012-11-13 DIAGNOSIS — Z23 Encounter for immunization: Secondary | ICD-10-CM

## 2012-11-13 NOTE — Progress Notes (Signed)
  Subjective:    Patient ID: [name], female    DOB: [birth year], 79 y.o.   MRN: [id number]  HPI He is here for followup visit after recent hospitalization. The hospital record was reviewed. She did have community-acquired pneumonia as well as acute on chronic heart failure. She was placed on Ceftin as an outpatient and did finish that roughly 1 week ago. Since then she has slowly improved having less and less difficulty with cough and congestion. She was seen recently by cardiology. That record was also reviewed.   Review of Systems     Objective:   Physical Exam Alert and in no distress. Lungs are clear to auscultation. Cardiac exam does show a 2/6 SEM.       Assessment & Plan:  CAP (community acquired pneumonia)  Immunization due - Plan: Pneumococcal polysaccharide vaccine 23-valent greater than or equal to 2yo subcutaneous/IM  Acute on chronic diastolic heart failure:  Last echo in June 2014 previous pneumococcal vaccine was 2006. I did update this. Cautioned her to call me if she does not continue to improve.

## 2012-11-13 NOTE — Patient Instructions (Signed)
If you don't keep getting better give me a call

## 2012-11-24 ENCOUNTER — Ambulatory Visit (INDEPENDENT_AMBULATORY_CARE_PROVIDER_SITE_OTHER): Payer: Medicare Other | Admitting: Pharmacist Clinician (PhC)/ Clinical Pharmacy Specialist

## 2012-11-24 VITALS — BP 140/62 | HR 64

## 2012-11-24 DIAGNOSIS — I4891 Unspecified atrial fibrillation: Secondary | ICD-10-CM

## 2012-11-24 DIAGNOSIS — Z7901 Long term (current) use of anticoagulants: Secondary | ICD-10-CM

## 2012-11-24 DIAGNOSIS — I480 Paroxysmal atrial fibrillation: Secondary | ICD-10-CM

## 2012-11-24 LAB — POCT INR: INR: 4.2

## 2012-11-24 NOTE — Progress Notes (Signed)
Can't close.  Marykay Lex, MD

## 2012-11-25 MED ORDER — FOLIC ACID 1 MG PO TABS: 2.0000 mg | ORAL_TABLET | Freq: Every day | ORAL | Status: DC

## 2012-12-15 ENCOUNTER — Ambulatory Visit (INDEPENDENT_AMBULATORY_CARE_PROVIDER_SITE_OTHER): Payer: Medicare Other | Admitting: Pharmacist Clinician (PhC)/ Clinical Pharmacy Specialist

## 2012-12-15 VITALS — BP 150/64 | HR 56

## 2012-12-15 DIAGNOSIS — I4891 Unspecified atrial fibrillation: Secondary | ICD-10-CM

## 2012-12-15 DIAGNOSIS — Z7901 Long term (current) use of anticoagulants: Secondary | ICD-10-CM

## 2012-12-15 DIAGNOSIS — I480 Paroxysmal atrial fibrillation: Secondary | ICD-10-CM

## 2012-12-15 LAB — POCT INR: INR: 3.9

## 2013-01-02 ENCOUNTER — Ambulatory Visit: Payer: 59

## 2013-01-02 ENCOUNTER — Ambulatory Visit: Payer: 59 | Admitting: Emergency Medicine

## 2013-01-02 VITALS — BP 126/78 | HR 69 | Temp 98.0°F | Resp 17 | Ht 63.5 in | Wt 140.0 lb

## 2013-01-02 DIAGNOSIS — R0602 Shortness of breath: Secondary | ICD-10-CM

## 2013-01-02 DIAGNOSIS — R05 Cough: Secondary | ICD-10-CM

## 2013-01-02 MED ORDER — HYDROCOD POLST-CHLORPHEN POLST 10-8 MG/5ML PO LQCR: 5.0000 mL | Freq: Two times a day (BID) | ORAL | Status: DC | PRN

## 2013-01-02 MED ORDER — AZITHROMYCIN 250 MG PO TABS: ORAL_TABLET | ORAL | Status: DC

## 2013-01-02 NOTE — Progress Notes (Signed)
[facility] [address], [place] [address] [phone]  Date:  01/02/2013   Name:  [name]   DOB:  [birth year]   MRN:  [id number]  PCP:  Carollee Herter, MD    Chief Complaint: Cough, URI and Otalgia   History of Present Illness:  SPRUHA WEIGHT is a 79 y.o. very pleasant female patient who presents with the following:  Ill since Thursday evening with increased cough not controlled with OTC medication.  Has mucoid nasal and post nasal drainage.  No fever or chills. No exertional dyspnea.  No orthopnea or PND.  1 pillow.  No increase in peripheral edema.  No chest pain, palpitations or tachycardia. No nausea or vomiting.  No diaphoresis.  No increase in salt.  Compliant with medications.  No improvement with over the counter medications or other home remedies. Denies other complaint or health concern today.   Patient Active Problem List   Diagnosis Date Noted  . Prolonged Q-T interval on ECG 11/02/2012  . Hypotension, unspecified 11/01/2012  . Hypokalemia 11/01/2012  . Acute respiratory failure with hypoxia 11/01/2012  . Leukocytosis, unspecified 11/01/2012  . CAP (community acquired pneumonia) 11/01/2012  . Long term (current) use of anticoagulants 08/12/2012  . Pleural effusion, left 10/11/2011  . Chronic cough 10/11/2011  .  Acute respiratiory failure requiring BiPap 06/24/2011  . Aortic valvular stenosis, moderate to severe 06/24/2011  . Accelerated hypertension - SBPs > on admission with Afib RVR -- resulting in acute diastolic HF 06/24/2011    Class: Present on Admission  . Paroxysmal atrial fibrillation, recurrent 06/21/2011  . Acute on chronic diastolic heart failure:  Last echo in June 2014 06/21/2011  . Hyperlipidemia 06/21/2011  . HTN (hypertension) 06/21/2011  . Hypothyroidism 06/21/2011  . Coronary artery disease 06/21/2011    Past Medical History  Diagnosis Date  . CHF (congestive heart failure)     2D ECHO,  06/21/2011 - EF 55-60%  . Hypothyroidism   . Dyslipidemia   . Hypertension   . PAF (paroxysmal atrial fibrillation)   .  Acute respiratiory failure requiring BiPap 06/24/2011  . Aortic valvular stenosis, moderate to severe 06/24/2011  . Atrial fibrillation   . Allergy   . Heart murmur   . Coronary artery disease   . Exertional shortness of breath   . Arthritis     "back" (10/30/2012)    Past Surgical History  Procedure Laterality Date  . Cardioversion  06/19/2011    Procedure: CARDIOVERSION;  Surgeon: Thurmon Fair, MD;  Location: [facility] OR;  Service: Cardiovascular;  Laterality: N/A;  . Appendectomy    . Angioplasty    . Cataract extraction w/ intraocular lens  implant, bilateral    . Lumbar disc surgery      "had to go in twice and clean out scar tissue" (10/30/2012)  . Coronary angioplasty with stent placement      "1" (10/30/2012)  . Dilation and curettage of uterus    . Excisional hemorrhoidectomy    . Wrist fracture surgery Left   . Shoulder acromioplasty Left     "fell; put a new ball in" (10/30/2012)  . Cardioversion  07/11/2010    Successful coversion to sinus rhythm  . Cardiac catheterization  10/06/2009    Continue medical therapy  . Cardiac catheterization  04/07/2009    RCA stented with a 3x20mm stent resulting in a reduction of 90% narrowing to normal    History  Substance Use Topics  .  Smoking status: Never Smoker   . Smokeless tobacco: Never Used  . Alcohol Use: No    Family History  Problem Relation Age of Onset  . Heart disease Brother   . Heart disease Brother   . Lung disease Brother   . Heart disease Mother 71  . Cancer Mother 2    Uterine  . Stroke Mother 30  . Heart disease Father   . Hypertension Child 34    Allergies  Allergen Reactions  . Sulfa Antibiotics Itching and Rash    Medication list has been reviewed and updated.  Current Outpatient Prescriptions on File Prior to Visit  Medication Sig Dispense Refill  . acetaminophen (TYLENOL)  650 MG CR tablet Take 1,950 mg by mouth daily.       Marland Kitchen amiodarone (PACERONE) 200 MG tablet Take 1 tablet (200 mg total) by mouth daily.  30 tablet  0  . aspirin EC 81 MG tablet Take 81 mg by mouth daily.      Marland Kitchen atorvastatin (LIPITOR) 40 MG tablet Take 40 mg by mouth daily.       Marland Kitchen ezetimibe (ZETIA) 10 MG tablet Take 10 mg by mouth daily.        . folic acid (FOLVITE) 1 MG tablet Take 2 tablets (2 mg total) by mouth daily.  180 tablet  3  . furosemide (LASIX) 40 MG tablet Take 40 mg by mouth daily.       Marland Kitchen levothyroxine (SYNTHROID, LEVOTHROID) 88 MCG tablet Take 88 mcg by mouth daily.       . metoprolol (LOPRESSOR) 50 MG tablet Take 25 mg by mouth 2 (two) times daily.      . Multiple Vitamins-Minerals (CENTRUM SILVER) tablet Take 1 tablet by mouth daily.        . potassium chloride SA (K-DUR,KLOR-CON) 20 MEQ tablet Take 20 mEq by mouth daily. Patient takes 1/2 tablet      . warfarin (COUMADIN) 5 MG tablet Take 1.25-2.5 mg by mouth daily. Takes 2.5 mg (1/2 tab) on Sun tues, wed ,thurs and sat  And1.25 mg ( 1/4 tab) on Mon and friday       No current facility-administered medications on file prior to visit.    Review of Systems:  As per HPI, otherwise negative.    Physical Examination: Filed Vitals:   01/02/13 1615  BP: 126/78  Pulse: 69  Temp: 98 F (36.7 C)  Resp: 17   Filed Vitals:   01/02/13 1615  Height: 5' 3.5" (1.613 m)  Weight: 140 lb (63.504 kg)   Body mass index is 24.41 kg/(m^2). Ideal Body Weight: Weight in (lb) to have BMI = 25: 143.1  GEN: WDWN, NAD, Non-toxic, A & O x 3 HEENT: Atraumatic, Normocephalic. Neck supple. No masses, No LAD. Ears and Nose: No external deformity. CV: RRR, No M/G/R. No JVD. No thrill. No extra heart sounds. PULM: CTA B, no wheezes,  rhonchi. No retractions. No resp. distress. No accessory muscle use.  Left basilar rales ABD: S, NT, ND, +BS. No rebound. No HSM. EXTR: No c/c/e NEURO Normal gait.  PSYCH: Normally interactive. Conversant.  Not depressed or anxious appearing.  Calm demeanor.    Assessment and Plan: Bronchitis zithromax tussionex   Signed,  Phillips Odor, MD   [facility] reading (PRIMARY) by  Dr. Dareen Piano.  Negative for acute disease.  No CHF.

## 2013-01-02 NOTE — Patient Instructions (Addendum)

## 2013-01-06 ENCOUNTER — Ambulatory Visit (INDEPENDENT_AMBULATORY_CARE_PROVIDER_SITE_OTHER): Payer: Medicare Other | Admitting: Pharmacist Clinician (PhC)/ Clinical Pharmacy Specialist

## 2013-01-06 VITALS — BP 142/66 | HR 64

## 2013-01-06 DIAGNOSIS — Z7901 Long term (current) use of anticoagulants: Secondary | ICD-10-CM

## 2013-01-06 DIAGNOSIS — I480 Paroxysmal atrial fibrillation: Secondary | ICD-10-CM

## 2013-01-06 DIAGNOSIS — I4891 Unspecified atrial fibrillation: Secondary | ICD-10-CM

## 2013-01-07 ENCOUNTER — Ambulatory Visit (INDEPENDENT_AMBULATORY_CARE_PROVIDER_SITE_OTHER): Payer: Medicare Other | Admitting: Family Medicine

## 2013-01-07 ENCOUNTER — Encounter: Payer: Self-pay | Admitting: Family Medicine

## 2013-01-07 VITALS — BP 160/90 | HR 96 | Temp 98.1°F | Wt 140.0 lb

## 2013-01-07 DIAGNOSIS — J209 Acute bronchitis, unspecified: Secondary | ICD-10-CM

## 2013-01-07 MED ORDER — HYDROCOD POLST-CHLORPHEN POLST 10-8 MG/5ML PO LQCR: 5.0000 mL | Freq: Two times a day (BID) | ORAL | Status: DC | PRN

## 2013-01-07 MED ORDER — AMOXICILLIN-POT CLAVULANATE 875-125 MG PO TABS: 1.0000 | ORAL_TABLET | Freq: Two times a day (BID) | ORAL | Status: DC

## 2013-01-07 NOTE — Progress Notes (Signed)
  Subjective:    Patient ID: [name], female    DOB: 1923/06/16, 79 y.o.   MRN: [id number]  HPI She was seen in an urgent care Center last Saturday and given Tussionex and a Z-Pak. She states that she is no better, having continued difficulty with cough and congestion as well as slight shortness of breath and malaise. No fever, chills, sore throat or earache.   Review of Systems     Objective:   Physical Exam alert and in no distress. Tympanic membrane on the left is dull with questionable fluid behind it, right is normal canals are normal. Throat is clear. Tonsils are normal. Neck is supple without adenopathy or thyromegaly. Cardiac exam shows a regular sinus rhythm without murmurs or gallops. Lungs show scattered rhonchi especially in the right lung field. Pulse ox is 95. She is not tachypneic.        Assessment & Plan:  Acute bronchitis - Plan: amoxicillin-clavulanate (AUGMENTIN) 875-125 MG per tablet, chlorpheniramine-HYDROcodone (TUSSIONEX PENNKINETIC ER) 10-8 MG/5ML LQCR  discussed her treatment with her and her husband; if her condition worsens, they're to go to the emergency room for further evaluation otherwise I will see her in roughly 10 days.

## 2013-01-07 NOTE — Patient Instructions (Addendum)
Use the cough medicine as needed and take all the antibiotic. Try to use the cough medicine only at night. If you get worse then go to the hospital

## 2013-01-15 ENCOUNTER — Ambulatory Visit (INDEPENDENT_AMBULATORY_CARE_PROVIDER_SITE_OTHER): Payer: Medicare Other | Admitting: Family Medicine

## 2013-01-15 VITALS — BP 118/70 | HR 64 | Wt 141.0 lb

## 2013-01-15 DIAGNOSIS — R05 Cough: Secondary | ICD-10-CM

## 2013-01-15 DIAGNOSIS — J209 Acute bronchitis, unspecified: Secondary | ICD-10-CM

## 2013-01-15 MED ORDER — HYDROCOD POLST-CHLORPHEN POLST 10-8 MG/5ML PO LQCR: 5.0000 mL | Freq: Two times a day (BID) | ORAL | Status: DC | PRN

## 2013-01-15 MED ORDER — AMOXICILLIN-POT CLAVULANATE 875-125 MG PO TABS: 1.0000 | ORAL_TABLET | Freq: Two times a day (BID) | ORAL | Status: DC

## 2013-01-15 NOTE — Progress Notes (Signed)
  Subjective:    Patient ID: [name], female    DOB: [birth year], 79 y.o.   MRN: [id number]  HPI She is here for recheck. She states that she is 75% better but still has rhinorrhea and slight cough. No fever, chills, myalgias or malaise.   Review of Systems     Objective:   Physical Exam alert and in no distress. Tympanic membranes and canals are normal. Throat is clear. Tonsils are normal. Neck is supple without adenopathy or thyromegaly. Cardiac exam shows a regular sinus rhythm without murmurs or gallops. Lungs are clear to auscultation.        Assessment & Plan:  Acute bronchitis - Plan: chlorpheniramine-HYDROcodone (TUSSIONEX PENNKINETIC ER) 10-8 MG/5ML LQCR, amoxicillin-clavulanate (AUGMENTIN) 875-125 MG per tablet  Cough - Plan: chlorpheniramine-HYDROcodone (TUSSIONEX PENNKINETIC ER) 10-8 MG/5ML LQCR, amoxicillin-clavulanate (AUGMENTIN) 875-125 MG per tablet  she is to call if not fully back to normal when she finishes this course of antibiotic.

## 2013-02-10 ENCOUNTER — Encounter: Payer: Self-pay | Admitting: Cardiovascular Disease

## 2013-02-10 ENCOUNTER — Ambulatory Visit (INDEPENDENT_AMBULATORY_CARE_PROVIDER_SITE_OTHER): Payer: Medicare Other | Admitting: Cardiovascular Disease

## 2013-02-10 ENCOUNTER — Ambulatory Visit (INDEPENDENT_AMBULATORY_CARE_PROVIDER_SITE_OTHER): Payer: Medicare Other | Admitting: Pharmacist Clinician (PhC)/ Clinical Pharmacy Specialist

## 2013-02-10 VITALS — BP 152/72 | HR 52 | Resp 16 | Ht 63.0 in | Wt 139.9 lb

## 2013-02-10 DIAGNOSIS — I359 Nonrheumatic aortic valve disorder, unspecified: Secondary | ICD-10-CM

## 2013-02-10 DIAGNOSIS — Z7901 Long term (current) use of anticoagulants: Secondary | ICD-10-CM

## 2013-02-10 DIAGNOSIS — E782 Mixed hyperlipidemia: Secondary | ICD-10-CM

## 2013-02-10 DIAGNOSIS — I350 Nonrheumatic aortic (valve) stenosis: Secondary | ICD-10-CM

## 2013-02-10 DIAGNOSIS — E039 Hypothyroidism, unspecified: Secondary | ICD-10-CM

## 2013-02-10 DIAGNOSIS — I480 Paroxysmal atrial fibrillation: Secondary | ICD-10-CM

## 2013-02-10 DIAGNOSIS — I4891 Unspecified atrial fibrillation: Secondary | ICD-10-CM

## 2013-02-10 DIAGNOSIS — I10 Essential (primary) hypertension: Secondary | ICD-10-CM

## 2013-02-10 DIAGNOSIS — E785 Hyperlipidemia, unspecified: Secondary | ICD-10-CM

## 2013-02-10 DIAGNOSIS — Z79899 Other long term (current) drug therapy: Secondary | ICD-10-CM

## 2013-02-10 DIAGNOSIS — I2510 Atherosclerotic heart disease of native coronary artery without angina pectoris: Secondary | ICD-10-CM

## 2013-02-10 DIAGNOSIS — I5033 Acute on chronic diastolic (congestive) heart failure: Secondary | ICD-10-CM

## 2013-02-10 NOTE — Progress Notes (Signed)
Patient ID: [name], female   DOB: [birth year], 79 y.o.   MRN: [id number]     Reason for office visit CAD, paroxysmal atrial fibrillation, diastolic heart failure, aortic stenosis  [name] is doing well from a cardiac standpoint but is slowly recovering from a protracted course of upper rest for infection. She still has rhinorrhea. She finished antibiotics about 3 weeks ago. She is taking an antihistamine. She does not have orthopnea. She complains of very rare and very mild edema in her ankles. She has not had any angina.    Allergies  Allergen Reactions  . Sulfa Antibiotics Itching and Rash    Current Outpatient Prescriptions  Medication Sig Dispense Refill  . acetaminophen (TYLENOL) 650 MG CR tablet Take 1,950 mg by mouth daily.       Marland Kitchen amiodarone (PACERONE) 200 MG tablet Take 1 tablet (200 mg total) by mouth daily.  30 tablet  0  . aspirin EC 81 MG tablet Take 81 mg by mouth daily.      Marland Kitchen atorvastatin (LIPITOR) 40 MG tablet Take 40 mg by mouth daily.       Marland Kitchen ezetimibe (ZETIA) 10 MG tablet Take 10 mg by mouth daily.        . folic acid (FOLVITE) 1 MG tablet Take 2 tablets (2 mg total) by mouth daily.  180 tablet  3  . furosemide (LASIX) 40 MG tablet Take 40 mg by mouth daily.       Marland Kitchen levothyroxine (SYNTHROID, LEVOTHROID) 88 MCG tablet Take 88 mcg by mouth daily.       . metoprolol (LOPRESSOR) 50 MG tablet Take 25 mg by mouth 2 (two) times daily.      . Multiple Vitamins-Minerals (CENTRUM SILVER) tablet Take 1 tablet by mouth daily.        . potassium chloride SA (K-DUR,KLOR-CON) 20 MEQ tablet Take 10 mEq by mouth daily.       Marland Kitchen warfarin (COUMADIN) 5 MG tablet Take 1.25-2.5 mg by mouth daily. Takes 2.5 mg (1/2 tab) on Sun tues, wed ,thurs and sat  And1.25 mg ( 1/4 tab) on Mon and friday       No current facility-administered medications for this visit.    Past Medical History  Diagnosis Date  . CHF (congestive heart failure)     2D ECHO, 06/21/2011 - EF 55-60%  .  Hypothyroidism   . Dyslipidemia   . Hypertension   . PAF (paroxysmal atrial fibrillation)   .  Acute respiratiory failure requiring BiPap 06/24/2011  . Aortic valvular stenosis, moderate to severe 06/24/2011  . Atrial fibrillation   . Allergy   . Heart murmur   . Coronary artery disease   . Exertional shortness of breath   . Arthritis     "back" (10/30/2012)    Past Surgical History  Procedure Laterality Date  . Cardioversion  06/19/2011    Procedure: CARDIOVERSION;  Surgeon: Thurmon Fair, MD;  Location: [facility];  Service: Cardiovascular;  Laterality: N/A;  . Appendectomy    . Angioplasty    . Cataract extraction w/ intraocular lens  implant, bilateral    . Lumbar disc surgery      "had to go in twice and clean out scar tissue" (10/30/2012)  . Coronary angioplasty with stent placement      "1" (10/30/2012)  . Dilation and curettage of uterus    . Excisional hemorrhoidectomy    . Wrist fracture surgery Left   . Shoulder acromioplasty Left     "  fell; put a new ball in" (10/30/2012)  . Cardioversion  07/11/2010    Successful coversion to sinus rhythm  . Cardiac catheterization  10/06/2009    Continue medical therapy  . Cardiac catheterization  04/07/2009    RCA stented with a 3x39mm stent resulting in a reduction of 90% narrowing to normal    Family History  Problem Relation Age of Onset  . Heart disease Brother   . Heart disease Brother   . Lung disease Brother   . Heart disease Mother 27  . Cancer Mother 22    Uterine  . Stroke Mother 49  . Heart disease Father   . Hypertension Child 34  . Alcohol abuse Child     History   Social History  . Marital Status: Married    Spouse Name: N/A    Number of Children: 1  . Years of Education: N/A   Occupational History  . Retired [employer]   Social History Main Topics  . Smoking status: Never Smoker   . Smokeless tobacco: Never Used  . Alcohol Use: No  . Drug Use: No  . Sexual Activity: No   Other Topics Concern   . Not on file   Social History Narrative  . No narrative on file    Review of systems: The patient specifically denies any chest pain at rest or with exertion, dyspnea at rest, orthopnea, paroxysmal nocturnal dyspnea, syncope, palpitations, focal neurological deficits, intermittent claudication,unexplained weight gain,  hemoptysis or wheezing.  The patient also denies abdominal pain, nausea, vomiting, dysphagia, diarrhea, constipation, polyuria, polydipsia, dysuria, hematuria, frequency, urgency, abnormal bleeding or bruising, fever, chills, unexpected weight changes, mood swings, change in skin or hair texture, change in voice quality, auditory or visual problems, allergic reactions or rashes, new musculoskeletal complaints other than usual "aches and pains".   PHYSICAL EXAM BP 152/72  Pulse 52  Resp 16  Ht 5\' 3"  (1.6 m)  Wt 139 lb 14.4 oz (63.458 kg)  BMI 24.79 kg/m2 Rechecked blood pressure was 136/70 mm Hg  General: Alert, oriented x3, no distress Head: no evidence of trauma, PERRL, EOMI, no exophtalmos or lid lag, no myxedema, no xanthelasma; normal ears, nose and oropharynx Neck: normal jugular venous pulsations and no hepatojugular reflux; brisk carotid pulses without delay; I lateral carotid bruits probably radiate from the chest Chest: clear to auscultation, no signs of consolidation by percussion or palpation, normal fremitus, symmetrical and full respiratory excursions Cardiovascular: normal position and quality of the apical impulse, regular rhythm, normal first and second heart sounds, no  rubs or gallops, 2-3/6 early peaking systolic murmur of aortic stenosis Abdomen: no tenderness or distention, no masses by palpation, no abnormal pulsatility or arterial bruits, normal bowel sounds, no hepatosplenomegaly Extremities: no clubbing, cyanosis or edema; 2+ radial, ulnar and brachial pulses bilaterally; 2+ right femoral, posterior tibial and dorsalis pedis pulses; 2+ left  femoral, posterior tibial and dorsalis pedis pulses; no subclavian or femoral bruits Neurological: grossly nonfocal   EKG: Sinus bradycardia with first degree AV block (300 ms) and prolonged QT interval (QTC 492 ms (  Lipid Panel     Component Value Date/Time   CHOL  Value: 140        ATP III CLASSIFICATION:  <200     mg/dL   Desirable  284-132  mg/dL   Borderline High  >=440    mg/dL   High        1/0/2725 0610   TRIG 67 12/31/2008 0610  HDL 44 12/31/2008 0610   CHOLHDL 3.2 12/31/2008 0610   VLDL 13 12/31/2008 0610   LDLCALC  Value: 83        Total Cholesterol/HDL:CHD Risk Coronary Heart Disease Risk Table                     Men   Women  1/2 Average Risk   3.4   3.3  Average Risk       5.0   4.4  2 X Average Risk   9.6   7.1  3 X Average Risk  23.4   11.0        Use the calculated Patient Ratio above and the CHD Risk Table to determine the patient's CHD Risk.        ATP III CLASSIFICATION (LDL):  <100     mg/dL   Optimal  784-696  mg/dL   Near or Above                    Optimal  130-159  mg/dL   Borderline  295-284  mg/dL   High  >132     mg/dL   Very High 08/28/100 7253    BMET    Component Value Date/Time   NA 135 11/06/2012 0445   K 3.7 11/06/2012 0445   CL 99 11/06/2012 0445   CO2 26 11/06/2012 0445   GLUCOSE 94 11/06/2012 0445   BUN 24* 11/06/2012 0445   CREATININE 0.90 11/06/2012 0445   CALCIUM 8.8 11/06/2012 0445   GFRNONAA 56* 11/06/2012 0445   GFRAA 65* 11/06/2012 0445     ASSESSMENT AND PLAN Acute on chronic diastolic heart failure:  Last echo in June 2014 No current signs or symptoms of acute exacerbation of heart through. Her respiratory difficulties indeed appear consistent with upper airway infection. Chronic NYHA class II. Euvolemic by exam  Hyperlipidemia October 2013 total cholesterol 137 triglycerides 115 HDL 40 LDL 74. Due to have repeat labs next month no changes planned medications at this time  HTN (hypertension) Good control  Hypothyroidism Normal TSH, check twice  yearly while on amiodarone  Coronary artery disease History of bare-metal stent to the right coronary artery in 2010 ( 3 x 18 mm integrity). No complaints of angina pectoris.  Paroxysmal atrial fibrillation, recurrent She generally becomes very symptomatic with atrial fibrillation and has required cardioversion twice, most recently in January 2014 amiodarone therapy seems to be helping. Need to monitor her thyroid and liver function tests twice yearly and make sure she gets a yearly ophthalmological exam and pulmonary function test. Continue lifelong warfarin anticoagulation barring any bleeding complications  Aortic valvular stenosis, moderate to severe By her most recent echocardiogram the mean gradient was 16 mm Hg, but the aortic valve there was isolated at 1 cm square. By clinical exam she appears to have moderate AS.  Orders Placed This Encounter  Procedures  . Lipid Profile  . Comp Met (CMET)  . EKG 12-Lead   No orders of the defined types were placed in this encounter.    Junious Silk, MD, [facility] [phone] office [phone] pager

## 2013-02-10 NOTE — Patient Instructions (Addendum)
Your physician recommends that you return for lab work in: One Month.  Your physician recommends that you schedule a follow-up appointment in: One year.

## 2013-02-12 ENCOUNTER — Ambulatory Visit: Payer: Medicare Other | Admitting: Pharmacist Clinician (PhC)/ Clinical Pharmacy Specialist

## 2013-02-12 ENCOUNTER — Ambulatory Visit: Payer: Medicare Other | Admitting: Cardiovascular Disease

## 2013-02-20 NOTE — Assessment & Plan Note (Signed)
By her most recent echocardiogram the mean gradient was 16 mm Hg, but the aortic valve there was isolated at 1 cm square. By clinical exam she appears to have moderate AS.

## 2013-02-20 NOTE — Assessment & Plan Note (Signed)
Normal TSH, check twice yearly while on amiodarone

## 2013-02-20 NOTE — Assessment & Plan Note (Addendum)
No current signs or symptoms of acute exacerbation of heart through. Her respiratory difficulties indeed appear consistent with upper airway infection. Chronic NYHA class II. Euvolemic by exam

## 2013-02-20 NOTE — Assessment & Plan Note (Signed)
She generally becomes very symptomatic with atrial fibrillation and has required cardioversion twice, most recently in January 2014 amiodarone therapy seems to be helping. Need to monitor her thyroid and liver function tests twice yearly and make sure she gets a yearly ophthalmological exam and pulmonary function test. Continue lifelong warfarin anticoagulation barring any bleeding complications

## 2013-02-20 NOTE — Assessment & Plan Note (Signed)
October 2013 total cholesterol 137 triglycerides 115 HDL 40 LDL 74. Due to have repeat labs next month no changes planned medications at this time

## 2013-02-20 NOTE — Assessment & Plan Note (Signed)
Good control

## 2013-02-20 NOTE — Assessment & Plan Note (Signed)
History of bare-metal stent to the right coronary artery in 2010 ( 3 x 18 mm integrity). No complaints of angina pectoris.

## 2013-03-10 ENCOUNTER — Ambulatory Visit: Payer: Medicare Other | Admitting: Pharmacist Clinician (PhC)/ Clinical Pharmacy Specialist

## 2013-03-12 ENCOUNTER — Ambulatory Visit (INDEPENDENT_AMBULATORY_CARE_PROVIDER_SITE_OTHER): Payer: Medicare Other | Admitting: Pharmacist Clinician (PhC)/ Clinical Pharmacy Specialist

## 2013-03-12 VITALS — BP 128/88 | HR 56

## 2013-03-12 DIAGNOSIS — I480 Paroxysmal atrial fibrillation: Secondary | ICD-10-CM

## 2013-03-12 DIAGNOSIS — Z7901 Long term (current) use of anticoagulants: Secondary | ICD-10-CM

## 2013-03-12 DIAGNOSIS — I4891 Unspecified atrial fibrillation: Secondary | ICD-10-CM

## 2013-03-12 LAB — COMPREHENSIVE METABOLIC PANEL
ALT: 20 U/L (ref 0–35)
AST: 24 U/L (ref 0–37)
Albumin: 4.3 g/dL (ref 3.5–5.2)
Alkaline Phosphatase: 70 U/L (ref 39–117)
BUN: 20 mg/dL (ref 6–23)
Calcium: 9.3 mg/dL (ref 8.4–10.5)
Chloride: 104 mEq/L (ref 96–112)
Potassium: 4.6 mEq/L (ref 3.5–5.3)
Sodium: 140 mEq/L (ref 135–145)

## 2013-03-12 LAB — LIPID PANEL
LDL Cholesterol: 87 mg/dL (ref 0–99)
Total CHOL/HDL Ratio: 3.3 Ratio
VLDL: 23 mg/dL (ref 0–40)

## 2013-03-12 MED ORDER — METOPROLOL TARTRATE 50 MG PO TABS: 25.0000 mg | ORAL_TABLET | Freq: Two times a day (BID) | ORAL | Status: DC

## 2013-03-12 MED ORDER — EZETIMIBE 10 MG PO TABS: 10.0000 mg | ORAL_TABLET | Freq: Every day | ORAL | Status: DC

## 2013-04-12 ENCOUNTER — Ambulatory Visit (INDEPENDENT_AMBULATORY_CARE_PROVIDER_SITE_OTHER): Payer: Medicare Other | Admitting: Pharmacist Clinician (PhC)/ Clinical Pharmacy Specialist

## 2013-04-12 VITALS — BP 146/70 | HR 56

## 2013-04-12 DIAGNOSIS — I4891 Unspecified atrial fibrillation: Secondary | ICD-10-CM

## 2013-04-12 DIAGNOSIS — I480 Paroxysmal atrial fibrillation: Secondary | ICD-10-CM

## 2013-04-12 DIAGNOSIS — Z7901 Long term (current) use of anticoagulants: Secondary | ICD-10-CM

## 2013-04-12 MED ORDER — LEVOTHYROXINE SODIUM 88 MCG PO TABS: 88.0000 ug | ORAL_TABLET | Freq: Every day | ORAL | Status: DC

## 2013-04-12 MED ORDER — POTASSIUM CHLORIDE CRYS ER 20 MEQ PO TBCR: 10.0000 meq | EXTENDED_RELEASE_TABLET | Freq: Every day | ORAL | Status: DC

## 2013-05-10 ENCOUNTER — Ambulatory Visit (INDEPENDENT_AMBULATORY_CARE_PROVIDER_SITE_OTHER): Payer: Medicare Other | Admitting: Pharmacist Clinician (PhC)/ Clinical Pharmacy Specialist

## 2013-05-10 VITALS — BP 132/64 | HR 56

## 2013-05-10 DIAGNOSIS — I4891 Unspecified atrial fibrillation: Secondary | ICD-10-CM

## 2013-05-10 DIAGNOSIS — Z7901 Long term (current) use of anticoagulants: Secondary | ICD-10-CM

## 2013-05-10 DIAGNOSIS — I480 Paroxysmal atrial fibrillation: Secondary | ICD-10-CM

## 2013-05-10 LAB — POCT INR: INR: 2.5

## 2013-05-10 MED ORDER — WARFARIN SODIUM 5 MG PO TABS: ORAL_TABLET | ORAL | Status: DC

## 2013-05-14 ENCOUNTER — Ambulatory Visit (INDEPENDENT_AMBULATORY_CARE_PROVIDER_SITE_OTHER): Payer: Medicare Other | Admitting: Family Medicine

## 2013-05-14 ENCOUNTER — Encounter: Payer: Self-pay | Admitting: Family Medicine

## 2013-05-14 VITALS — BP 112/70 | HR 52 | Wt 140.0 lb

## 2013-05-14 DIAGNOSIS — H1131 Conjunctival hemorrhage, right eye: Secondary | ICD-10-CM

## 2013-05-14 DIAGNOSIS — H1130 Conjunctival hemorrhage, unspecified eye: Secondary | ICD-10-CM

## 2013-05-14 NOTE — Progress Notes (Signed)
   Subjective:    Patient ID: [name], female    DOB: 1924/05/06, 79 y.o.   MRN: [id number]  HPI She noted that one week ago that she had a red eye. No history of injury. No blurred or double vision or headache.   Review of Systems     Objective:   Physical Exam The right conjunctiva laterally does show erythema. Anterior chamber and cornea normal.       Assessment & Plan:  Subconjunctival hemorrhage of right eye  I reassured her that this was nothing to be concerned about. No intervention is really needed.

## 2013-06-07 ENCOUNTER — Ambulatory Visit (INDEPENDENT_AMBULATORY_CARE_PROVIDER_SITE_OTHER): Payer: Medicare Other | Admitting: Pharmacist Clinician (PhC)/ Clinical Pharmacy Specialist

## 2013-06-07 VITALS — BP 138/70 | HR 68

## 2013-06-07 DIAGNOSIS — I480 Paroxysmal atrial fibrillation: Secondary | ICD-10-CM

## 2013-06-07 DIAGNOSIS — Z7901 Long term (current) use of anticoagulants: Secondary | ICD-10-CM

## 2013-06-07 DIAGNOSIS — I4891 Unspecified atrial fibrillation: Secondary | ICD-10-CM

## 2013-06-07 LAB — POCT INR: INR: 2.8

## 2013-07-05 ENCOUNTER — Ambulatory Visit (INDEPENDENT_AMBULATORY_CARE_PROVIDER_SITE_OTHER): Payer: Medicare Other | Admitting: Pharmacist Clinician (PhC)/ Clinical Pharmacy Specialist

## 2013-07-05 VITALS — BP 140/66 | HR 56

## 2013-07-05 DIAGNOSIS — I480 Paroxysmal atrial fibrillation: Secondary | ICD-10-CM

## 2013-07-05 DIAGNOSIS — Z7901 Long term (current) use of anticoagulants: Secondary | ICD-10-CM

## 2013-07-05 DIAGNOSIS — I4891 Unspecified atrial fibrillation: Secondary | ICD-10-CM

## 2013-07-05 LAB — POCT INR: INR: 2.7

## 2013-08-16 ENCOUNTER — Ambulatory Visit (INDEPENDENT_AMBULATORY_CARE_PROVIDER_SITE_OTHER): Payer: Medicare Other | Admitting: Pharmacist Clinician (PhC)/ Clinical Pharmacy Specialist

## 2013-08-16 DIAGNOSIS — Z7901 Long term (current) use of anticoagulants: Secondary | ICD-10-CM

## 2013-08-16 DIAGNOSIS — I4891 Unspecified atrial fibrillation: Secondary | ICD-10-CM

## 2013-08-16 DIAGNOSIS — I480 Paroxysmal atrial fibrillation: Secondary | ICD-10-CM

## 2013-08-16 LAB — POCT INR: INR: 3.4

## 2013-08-17 ENCOUNTER — Other Ambulatory Visit: Payer: Self-pay | Admitting: Pharmacist Clinician (PhC)/ Clinical Pharmacy Specialist

## 2013-08-17 MED ORDER — ATORVASTATIN CALCIUM 40 MG PO TABS: 40.0000 mg | ORAL_TABLET | Freq: Every day | ORAL | Status: DC

## 2013-09-27 ENCOUNTER — Ambulatory Visit (INDEPENDENT_AMBULATORY_CARE_PROVIDER_SITE_OTHER): Payer: Medicare Other | Admitting: Pharmacist Clinician (PhC)/ Clinical Pharmacy Specialist

## 2013-09-27 DIAGNOSIS — I4891 Unspecified atrial fibrillation: Secondary | ICD-10-CM

## 2013-09-27 DIAGNOSIS — Z7901 Long term (current) use of anticoagulants: Secondary | ICD-10-CM

## 2013-09-27 DIAGNOSIS — I480 Paroxysmal atrial fibrillation: Secondary | ICD-10-CM

## 2013-09-27 LAB — HM MAMMOGRAPHY: HM MAMMO: NEGATIVE

## 2013-09-27 LAB — POCT INR: INR: 3.4

## 2013-09-27 MED ORDER — LEVOTHYROXINE SODIUM 88 MCG PO TABS: 88.0000 ug | ORAL_TABLET | Freq: Every day | ORAL | Status: DC

## 2013-09-27 MED ORDER — POTASSIUM CHLORIDE CRYS ER 20 MEQ PO TBCR: 10.0000 meq | EXTENDED_RELEASE_TABLET | Freq: Every day | ORAL | Status: DC

## 2013-09-27 MED ORDER — FUROSEMIDE 40 MG PO TABS: 40.0000 mg | ORAL_TABLET | Freq: Every day | ORAL | Status: DC

## 2013-09-28 ENCOUNTER — Encounter: Payer: Self-pay | Admitting: Internal Medicine

## 2013-11-01 ENCOUNTER — Ambulatory Visit (INDEPENDENT_AMBULATORY_CARE_PROVIDER_SITE_OTHER): Payer: Medicare Other | Admitting: Pharmacist Clinician (PhC)/ Clinical Pharmacy Specialist

## 2013-11-01 DIAGNOSIS — I480 Paroxysmal atrial fibrillation: Secondary | ICD-10-CM

## 2013-11-01 DIAGNOSIS — I4891 Unspecified atrial fibrillation: Secondary | ICD-10-CM

## 2013-11-01 DIAGNOSIS — Z7901 Long term (current) use of anticoagulants: Secondary | ICD-10-CM

## 2013-11-01 LAB — POCT INR: INR: 1.6

## 2013-11-22 ENCOUNTER — Ambulatory Visit (INDEPENDENT_AMBULATORY_CARE_PROVIDER_SITE_OTHER): Payer: Medicare Other | Admitting: Pharmacist Clinician (PhC)/ Clinical Pharmacy Specialist

## 2013-11-22 DIAGNOSIS — I4891 Unspecified atrial fibrillation: Secondary | ICD-10-CM

## 2013-11-22 DIAGNOSIS — I480 Paroxysmal atrial fibrillation: Secondary | ICD-10-CM

## 2013-11-22 DIAGNOSIS — Z7901 Long term (current) use of anticoagulants: Secondary | ICD-10-CM

## 2013-11-22 LAB — POCT INR: INR: 2.7

## 2013-12-13 ENCOUNTER — Ambulatory Visit: Payer: Medicare Other | Admitting: Pharmacist Clinician (PhC)/ Clinical Pharmacy Specialist

## 2013-12-24 ENCOUNTER — Ambulatory Visit (INDEPENDENT_AMBULATORY_CARE_PROVIDER_SITE_OTHER): Payer: Medicare Other | Admitting: Pharmacist Clinician (PhC)/ Clinical Pharmacy Specialist

## 2013-12-24 ENCOUNTER — Other Ambulatory Visit: Payer: Self-pay | Admitting: Pharmacist Clinician (PhC)/ Clinical Pharmacy Specialist

## 2013-12-24 DIAGNOSIS — Z7901 Long term (current) use of anticoagulants: Secondary | ICD-10-CM

## 2013-12-24 DIAGNOSIS — I4891 Unspecified atrial fibrillation: Secondary | ICD-10-CM

## 2013-12-24 DIAGNOSIS — I480 Paroxysmal atrial fibrillation: Secondary | ICD-10-CM

## 2013-12-24 LAB — POCT INR: INR: 1.7

## 2013-12-24 MED ORDER — METOPROLOL TARTRATE 50 MG PO TABS: 25.0000 mg | ORAL_TABLET | Freq: Two times a day (BID) | ORAL | Status: DC

## 2013-12-24 MED ORDER — EZETIMIBE 10 MG PO TABS: 10.0000 mg | ORAL_TABLET | Freq: Every day | ORAL | Status: DC

## 2013-12-24 MED ORDER — FOLIC ACID 1 MG PO TABS: 2.0000 mg | ORAL_TABLET | Freq: Every day | ORAL | Status: DC

## 2013-12-24 MED ORDER — AMIODARONE HCL 200 MG PO TABS: 200.0000 mg | ORAL_TABLET | Freq: Every day | ORAL | Status: DC

## 2013-12-24 MED ORDER — WARFARIN SODIUM 5 MG PO TABS: ORAL_TABLET | ORAL | Status: DC

## 2014-01-14 ENCOUNTER — Ambulatory Visit (INDEPENDENT_AMBULATORY_CARE_PROVIDER_SITE_OTHER): Payer: Medicare Other | Admitting: Pharmacist Clinician (PhC)/ Clinical Pharmacy Specialist

## 2014-01-14 DIAGNOSIS — I480 Paroxysmal atrial fibrillation: Secondary | ICD-10-CM

## 2014-01-14 DIAGNOSIS — Z7901 Long term (current) use of anticoagulants: Secondary | ICD-10-CM

## 2014-01-14 DIAGNOSIS — I4891 Unspecified atrial fibrillation: Secondary | ICD-10-CM

## 2014-01-14 LAB — POCT INR: INR: 2

## 2014-01-26 ENCOUNTER — Ambulatory Visit (INDEPENDENT_AMBULATORY_CARE_PROVIDER_SITE_OTHER): Payer: Medicare Other | Admitting: Family Medicine

## 2014-01-26 ENCOUNTER — Encounter: Payer: Self-pay | Admitting: Family Medicine

## 2014-01-26 VITALS — BP 130/80 | HR 52 | Wt 142.0 lb

## 2014-01-26 DIAGNOSIS — E785 Hyperlipidemia, unspecified: Secondary | ICD-10-CM

## 2014-01-26 DIAGNOSIS — Z7901 Long term (current) use of anticoagulants: Secondary | ICD-10-CM

## 2014-01-26 DIAGNOSIS — I350 Nonrheumatic aortic (valve) stenosis: Secondary | ICD-10-CM

## 2014-01-26 DIAGNOSIS — R233 Spontaneous ecchymoses: Secondary | ICD-10-CM

## 2014-01-26 DIAGNOSIS — I359 Nonrheumatic aortic valve disorder, unspecified: Secondary | ICD-10-CM

## 2014-01-26 LAB — COMPREHENSIVE METABOLIC PANEL
ALT: 22 U/L (ref 0–35)
AST: 26 U/L (ref 0–37)
Albumin: 4.2 g/dL (ref 3.5–5.2)
Alkaline Phosphatase: 68 U/L (ref 39–117)
BILIRUBIN TOTAL: 0.8 mg/dL (ref 0.2–1.2)
BUN: 15 mg/dL (ref 6–23)
CO2: 28 mEq/L (ref 19–32)
CREATININE: 1.05 mg/dL (ref 0.50–1.10)
Calcium: 9 mg/dL (ref 8.4–10.5)
Chloride: 104 mEq/L (ref 96–112)
GLUCOSE: 87 mg/dL (ref 70–99)
Potassium: 4.6 mEq/L (ref 3.5–5.3)
Sodium: 140 mEq/L (ref 135–145)
Total Protein: 7.1 g/dL (ref 6.0–8.3)

## 2014-01-26 LAB — PROTIME-INR
INR: 2.27 — ABNORMAL HIGH (ref <1.50)
PROTHROMBIN TIME: 25.2 s — AB (ref 11.6–15.2)

## 2014-01-26 LAB — CBC WITH DIFFERENTIAL/PLATELET
BASOS PCT: 1 % (ref 0–1)
Basophils Absolute: 0.1 10*3/uL (ref 0.0–0.1)
EOS ABS: 0.1 10*3/uL (ref 0.0–0.7)
Eosinophils Relative: 1 % (ref 0–5)
HEMATOCRIT: 37.4 % (ref 36.0–46.0)
Hemoglobin: 12.4 g/dL (ref 12.0–15.0)
Lymphocytes Relative: 16 % (ref 12–46)
Lymphs Abs: 1 10*3/uL (ref 0.7–4.0)
MCH: 30.5 pg (ref 26.0–34.0)
MCHC: 33.2 g/dL (ref 30.0–36.0)
MCV: 91.9 fL (ref 78.0–100.0)
MONO ABS: 0.7 10*3/uL (ref 0.1–1.0)
MONOS PCT: 11 % (ref 3–12)
Neutro Abs: 4.3 10*3/uL (ref 1.7–7.7)
Neutrophils Relative %: 71 % (ref 43–77)
Platelets: 250 10*3/uL (ref 150–400)
RBC: 4.07 MIL/uL (ref 3.87–5.11)
RDW: 14.2 % (ref 11.5–15.5)
WBC: 6.1 10*3/uL (ref 4.0–10.5)

## 2014-01-26 LAB — LIPID PANEL
CHOL/HDL RATIO: 3.1 ratio
Cholesterol: 139 mg/dL (ref 0–200)
HDL: 45 mg/dL (ref >39)
LDL Cholesterol: 74 mg/dL (ref 0–99)
TRIGLYCERIDES: 98 mg/dL (ref <150)
VLDL: 20 mg/dL (ref 0–40)

## 2014-01-26 NOTE — Progress Notes (Signed)
   Subjective:    Patient ID: [name], female    DOB: [birth year], 80 y.o.   MRN: [id number]  HPI She is here for a rash that she noted yesterday. She states she felt an itching sensation on her abdomen and checked did note a rash. She has had no new soaps, detergents, medications, foods. She continues on her present medications including Coumadin. Her last PT/INR was 2 approximately one week ago. She has noted no new bleeding or bruising.   Review of Systems     Objective:   Physical Exam alert and in no distress. Tympanic membranes and canals are normal. Throat is clear. Tonsils are normal. Neck is supple without adenopathy or thyromegaly. Cardiac exam shows a regular sinus rhythm without murmurs or gallops. Lungs are clear to auscultation. Exam of her abdomen shows a diffuse petechial type rash mainly on the main part of her abdomen but not on the upper chest or in the pelvic area. To a lesser extent she has a swan her back. No lesions noted on her hands or extremities. Conjunctiva clear. PT/INR is 2.2.      Assessment & Plan:  Aortic valvular stenosis, moderate to severe  Hyperlipidemia - Plan: Lipid panel  Petechial rash - Plan: CBC with Differential, Comprehensive metabolic panel, Protime-INR  Long term (current) use of anticoagulants - Plan: Protime-INR  recommend Benadryl. We'll followup on the with the blood work comes in.

## 2014-02-01 ENCOUNTER — Telehealth: Payer: Self-pay | Admitting: Family Medicine

## 2014-02-01 NOTE — Telephone Encounter (Signed)
PT INFORMED OF APPOINTMENT 01/26/14 AT 2:10 WITH DR.LUPTON

## 2014-02-01 NOTE — Telephone Encounter (Signed)
Pt called to speak with Cheri to advise her that her rash is no better.  Please call her with in 1 hour or she will be gone for the day   Pt ph [phone]

## 2014-02-16 ENCOUNTER — Ambulatory Visit (INDEPENDENT_AMBULATORY_CARE_PROVIDER_SITE_OTHER): Payer: Medicare Other | Admitting: Pharmacist Clinician (PhC)/ Clinical Pharmacy Specialist

## 2014-02-16 ENCOUNTER — Ambulatory Visit (INDEPENDENT_AMBULATORY_CARE_PROVIDER_SITE_OTHER): Payer: Medicare Other | Admitting: Cardiovascular Disease

## 2014-02-16 ENCOUNTER — Encounter: Payer: Self-pay | Admitting: Cardiovascular Disease

## 2014-02-16 VITALS — BP 167/75 | HR 49 | Resp 16 | Ht 63.0 in | Wt 143.0 lb

## 2014-02-16 DIAGNOSIS — I480 Paroxysmal atrial fibrillation: Secondary | ICD-10-CM

## 2014-02-16 DIAGNOSIS — I359 Nonrheumatic aortic valve disorder, unspecified: Secondary | ICD-10-CM

## 2014-02-16 DIAGNOSIS — I4891 Unspecified atrial fibrillation: Secondary | ICD-10-CM

## 2014-02-16 DIAGNOSIS — T50905A Adverse effect of unspecified drugs, medicaments and biological substances, initial encounter: Secondary | ICD-10-CM

## 2014-02-16 DIAGNOSIS — I498 Other specified cardiac arrhythmias: Secondary | ICD-10-CM

## 2014-02-16 DIAGNOSIS — I5032 Chronic diastolic (congestive) heart failure: Secondary | ICD-10-CM

## 2014-02-16 DIAGNOSIS — Z7901 Long term (current) use of anticoagulants: Secondary | ICD-10-CM

## 2014-02-16 DIAGNOSIS — I350 Nonrheumatic aortic (valve) stenosis: Secondary | ICD-10-CM

## 2014-02-16 DIAGNOSIS — I10 Essential (primary) hypertension: Secondary | ICD-10-CM

## 2014-02-16 DIAGNOSIS — R001 Bradycardia, unspecified: Secondary | ICD-10-CM

## 2014-02-16 DIAGNOSIS — I2510 Atherosclerotic heart disease of native coronary artery without angina pectoris: Secondary | ICD-10-CM

## 2014-02-16 DIAGNOSIS — E038 Other specified hypothyroidism: Secondary | ICD-10-CM

## 2014-02-16 LAB — T4, FREE: Free T4: 1.39 ng/dL (ref 0.80–1.80)

## 2014-02-16 LAB — TSH: TSH: 2.334 u[IU]/mL (ref 0.350–4.500)

## 2014-02-16 LAB — POCT INR: INR: 2.4

## 2014-02-16 MED ORDER — METOPROLOL TARTRATE 25 MG PO TABS: 12.5000 mg | ORAL_TABLET | Freq: Two times a day (BID) | ORAL | Status: DC

## 2014-02-16 NOTE — Patient Instructions (Signed)
DECREASE Metoprolol to 25mg  1/2 tablet twice a day.  Your physician recommends that you return for lab work in: Thyroid panel at your convenience.  Dr. Sallyanne Kuster recommends that you schedule a follow-up appointment in: One year.

## 2014-02-20 ENCOUNTER — Encounter: Payer: Self-pay | Admitting: Cardiovascular Disease

## 2014-02-20 DIAGNOSIS — R001 Bradycardia, unspecified: Secondary | ICD-10-CM | POA: Insufficient documentation

## 2014-02-20 DIAGNOSIS — T50905A Adverse effect of unspecified drugs, medicaments and biological substances, initial encounter: Secondary | ICD-10-CM

## 2014-02-20 NOTE — Progress Notes (Signed)
Patient ID: [name], female   DOB: 12-20-1923, 80 y.o.   MRN: [id number]     Reason for office visit CAD, paroxysmal atrial fibrillation, diastolic heart failure, aortic stenosis  [name] feels great today and denies any problems with shortness of breath, angina or lower extremity edema. She denies dizziness or syncope but is markedly bradycardic. She has not had palpitations. She has developed a pruritic erythematous papular rash all over her chest and abdomen. She has seen a dermatologist was performed the biopsy. It is presumed to be a viral rash and she is applying topical steroids.  [name] is now 80 years old and has a variety of cardiac problems, but they have all been recently quiescent.  She has coronary artery disease and received a bare-metal stent (3x18 mm intake he) to the right coronary in 2010. She has not had angina pectoris since.  She has diastolic heart failure with a remote acute exacerbation in June 2014. Her echo showed an ejection fraction of 55-60% and pseudo-normal mitral inflow.  She has moderate aortic stenosis (estimated valve area 1.2 cm square, mean gradient 18 mm Hg). She has a history of recurrent paroxysmal atrial fibrillation and has required cardioversion twice in the past (most recently January 2014. She has responded well to amiodarone antiarrhythmic therapy. She is on chronic warfarin anticoagulation and has not had stroke or bleeding complications. She has treated hyperlipidemia, hypertension and hypothyroidism  Allergies  Allergen Reactions  . Sulfa Antibiotics Itching and Rash    Current Outpatient Prescriptions  Medication Sig Dispense Refill  . acetaminophen (TYLENOL) 650 MG CR tablet Take 1,950 mg by mouth daily.       Marland Kitchen amiodarone (PACERONE) 200 MG tablet Take 1 tablet (200 mg total) by mouth daily.  90 tablet  2  . aspirin EC 81 MG tablet Take 81 mg by mouth daily.      Marland Kitchen atorvastatin (LIPITOR) 40 MG tablet Take 1 tablet (40 mg total)  by mouth daily.  90 tablet  2  . ezetimibe (ZETIA) 10 MG tablet Take 1 tablet (10 mg total) by mouth daily.  90 tablet  2  . folic acid (FOLVITE) 1 MG tablet Take 2 tablets (2 mg total) by mouth daily.  180 tablet  2  . furosemide (LASIX) 40 MG tablet Take 1 tablet (40 mg total) by mouth daily.  90 tablet  2  . levothyroxine (SYNTHROID, LEVOTHROID) 88 MCG tablet Take 1 tablet (88 mcg total) by mouth daily.  90 tablet  2  . Multiple Vitamins-Minerals (CENTRUM SILVER) tablet Take 1 tablet by mouth daily.        . potassium chloride SA (K-DUR,KLOR-CON) 20 MEQ tablet Take 0.5 tablets (10 mEq total) by mouth daily.  45 tablet  2  . triamcinolone cream (KENALOG) 0.1 % Apply 1 application topically 2 (two) times daily.       Marland Kitchen warfarin (COUMADIN) 5 MG tablet Take 1 tablet by mouth daily or as directed  90 tablet  2  . metoprolol tartrate (LOPRESSOR) 25 MG tablet Take 0.5 tablets (12.5 mg total) by mouth 2 (two) times daily.  90 tablet  3   No current facility-administered medications for this visit.    Past Medical History  Diagnosis Date  . CHF (congestive heart failure)     2D ECHO, 06/21/2011 - EF 55-60%  . Hypothyroidism   . Dyslipidemia   . Hypertension   . PAF (paroxysmal atrial fibrillation)   .  Acute respiratiory failure  requiring BiPap 06/24/2011  . Aortic valvular stenosis, moderate to severe 06/24/2011  . Atrial fibrillation   . Allergy   . Heart murmur   . Coronary artery disease   . Exertional shortness of breath   . Arthritis     "back" (10/30/2012)    Past Surgical History  Procedure Laterality Date  . Cardioversion  06/19/2011    Procedure: CARDIOVERSION;  Surgeon: Sanda Klein, MD;  Location: [facility];  Service: Cardiovascular;  Laterality: N/A;  . Appendectomy    . Angioplasty    . Cataract extraction w/ intraocular lens  implant, bilateral    . Lumbar disc surgery      "had to go in twice and clean out scar tissue" (10/30/2012)  . Coronary angioplasty with stent  placement      "1" (10/30/2012)  . Dilation and curettage of uterus    . Excisional hemorrhoidectomy    . Wrist fracture surgery Left   . Shoulder acromioplasty Left     "fell; put a new ball in" (10/30/2012)  . Cardioversion  07/11/2010    Successful coversion to sinus rhythm  . Cardiac catheterization  10/06/2009    Continue medical therapy  . Cardiac catheterization  04/07/2009    RCA stented with a 3x63mm stent resulting in a reduction of 90% narrowing to normal    Family History  Problem Relation Age of Onset  . Heart disease Brother   . Heart disease Brother   . Lung disease Brother   . Heart disease Mother 78  . Cancer Mother 12    Uterine  . Stroke Mother 70  . Heart disease Father   . Hypertension Child 34  . Alcohol abuse Child     History   Social History  . Marital Status: Married    Spouse Name: N/A    Number of Children: 1  . Years of Education: N/A   Occupational History  . Retired [employer]   Social History Main Topics  . Smoking status: Never Smoker   . Smokeless tobacco: Never Used  . Alcohol Use: No  . Drug Use: No  . Sexual Activity: No   Other Topics Concern  . Not on file   Social History Narrative  . No narrative on file    Review of systems: Widespread pruritic erythematous papular rash. The patient specifically denies any chest pain at rest or with exertion, dyspnea at rest or with exertion, orthopnea, paroxysmal nocturnal dyspnea, syncope, palpitations, focal neurological deficits, intermittent claudication, lower extremity edema, unexplained weight gain, cough, hemoptysis or wheezing.  The patient also denies abdominal pain, nausea, vomiting, dysphagia, diarrhea, constipation, polyuria, polydipsia, dysuria, hematuria, frequency, urgency, abnormal bleeding or bruising, fever, chills, unexpected weight changes, mood swings, change in skin or hair texture, change in voice quality, auditory or visual problems,  new musculoskeletal  complaints other than usual "aches and pains".   PHYSICAL EXAM BP 167/75  Pulse 49  Resp 16  Ht 5\' 3"  (1.6 m)  Wt 64.864 kg (143 lb)  BMI 25.34 kg/m2  General: Alert, oriented x3, no distress  Head: no evidence of trauma, PERRL, EOMI, no exophtalmos or lid lag, no myxedema, no xanthelasma; normal ears, nose and oropharynx  Neck: normal jugular venous pulsations and no hepatojugular reflux; brisk carotid pulses without delay; I lateral carotid bruits probably radiate from the chest  Chest: clear to auscultation, no signs of consolidation by percussion or palpation, normal fremitus, symmetrical and full respiratory excursions  Cardiovascular: normal position and  quality of the apical impulse, regular rhythm, normal first and second heart sounds, no rubs or gallops, 2-3/6 early peaking systolic murmur of aortic stenosis  Abdomen: no tenderness or distention, no masses by palpation, no abnormal pulsatility or arterial bruits, normal bowel sounds, no hepatosplenomegaly  Extremities: no clubbing, cyanosis or edema; 2+ radial, ulnar and brachial pulses bilaterally; 2+ right femoral, posterior tibial and dorsalis pedis pulses; 2+ left femoral, posterior tibial and dorsalis pedis pulses; no subclavian or femoral bruits  Neurological: grossly nonfocal  EKG: Marked sinus bradycardia at 49 beats per minute, first degree AV block, PR 272 ms, left axis deviation close to left anterior fascicular block, QTC 478 ms  Lipid Panel     Component Value Date/Time   CHOL 139 01/26/2014 1210   TRIG 98 01/26/2014 1210   HDL 45 01/26/2014 1210   CHOLHDL 3.1 01/26/2014 1210   VLDL 20 01/26/2014 1210   LDLCALC 74 01/26/2014 1210    BMET    Component Value Date/Time   NA 140 01/26/2014 1210   K 4.6 01/26/2014 1210   CL 104 01/26/2014 1210   CO2 28 01/26/2014 1210   GLUCOSE 87 01/26/2014 1210   BUN 15 01/26/2014 1210   CREATININE 1.05 01/26/2014 1210   CREATININE 0.90 11/06/2012 0445   CALCIUM 9.0 01/26/2014 1210   GFRNONAA 56*  11/06/2012 0445   GFRAA 65* 11/06/2012 0445     ASSESSMENT AND PLAN  Acute on chronic diastolic heart failure: Last echo in June 2014  No current signs or symptoms of acute exacerbation of heart failure. Chronic NYHA class I-II. Euvolemic by exam  Hyperlipidemia  Good lipid parameters recently HTN (hypertension)  Good control - usually, slightly high in office. Hypothyroidism  Recheck thyroid labs especially since she has bradycardia  Coronary artery disease  History of bare-metal stent to the right coronary artery in 2010 ( 3 x 18 mm integrity). No complaints of angina pectoris.  Paroxysmal atrial fibrillation, recurrent  She has marked sinus bradycardia, albeit asymptomatic. We'll decrease her beta blocker dose She generally becomes very symptomatic with atrial fibrillation and has required cardioversion twice, most recently in January 2014. Amiodarone therapy seems to be helping. Reminded her to get a yearly ophthalmological exam. Continue lifelong warfarin anticoagulation barring any bleeding complications  Aortic valvular stenosis, moderate By clinical exam she appears to have moderate AS. Reassess echo for change in symptoms  Patient Instructions  DECREASE Metoprolol to 25mg  1/2 tablet twice a day.  Your physician recommends that you return for lab work in: Thyroid panel at your convenience.  Dr. Sallyanne Kuster recommends that you schedule a follow-up appointment in: One year.      Orders Placed This Encounter  Procedures  . TSH  . T4, free  . EKG 12-Lead   Meds ordered this encounter  Medications  . triamcinolone cream (KENALOG) 0.1 %    Sig: Apply 1 application topically 2 (two) times daily.   . metoprolol tartrate (LOPRESSOR) 25 MG tablet    Sig: Take 0.5 tablets (12.5 mg total) by mouth 2 (two) times daily.    Dispense:  90 tablet    Refill:  [place]ote[place] Shandy Checo, MD, [facility] [phone] office [phone] pager

## 2014-03-16 ENCOUNTER — Ambulatory Visit (INDEPENDENT_AMBULATORY_CARE_PROVIDER_SITE_OTHER): Payer: Medicare Other | Admitting: Pharmacist Clinician (PhC)/ Clinical Pharmacy Specialist

## 2014-03-16 DIAGNOSIS — Z7901 Long term (current) use of anticoagulants: Secondary | ICD-10-CM

## 2014-03-16 DIAGNOSIS — I480 Paroxysmal atrial fibrillation: Secondary | ICD-10-CM

## 2014-03-16 LAB — POCT INR: INR: 2.7

## 2014-03-28 ENCOUNTER — Telehealth: Payer: Self-pay | Admitting: Cardiovascular Disease

## 2014-03-28 NOTE — Telephone Encounter (Signed)
[name] from [facility] called with patient complaints of a rash over her body.  The dermatologist thinks that it is coming from the Lasix even though the patient has been on the medication for years.  [name] does have a sulfa allergy.  The dermatology office would like for [place] to try another diuretic and call them with any recommendations. I will defer to [facility].

## 2014-03-29 MED ORDER — TORSEMIDE 20 MG PO TABS: 20.0000 mg | ORAL_TABLET | Freq: Once | ORAL | Status: DC

## 2014-03-29 NOTE — Telephone Encounter (Signed)
Spoke with patient and informed her of the change in medication.  She verbalized understanding and RX sent to pharmacy

## 2014-03-29 NOTE — Telephone Encounter (Signed)
[name]  Have pt stop furosemide 40mg .  Wait 3-4 days then start torsemide 20mg  once daily.  Let us know if the rash clears up or stays once on the torsemide.

## 2014-04-15 ENCOUNTER — Ambulatory Visit (INDEPENDENT_AMBULATORY_CARE_PROVIDER_SITE_OTHER): Payer: Medicare Other | Admitting: Pharmacist Clinician (PhC)/ Clinical Pharmacy Specialist

## 2014-04-15 DIAGNOSIS — I480 Paroxysmal atrial fibrillation: Secondary | ICD-10-CM

## 2014-04-15 DIAGNOSIS — Z7901 Long term (current) use of anticoagulants: Secondary | ICD-10-CM

## 2014-04-25 ENCOUNTER — Other Ambulatory Visit: Payer: Self-pay | Admitting: Cardiovascular Disease

## 2014-04-25 MED ORDER — ATORVASTATIN CALCIUM 40 MG PO TABS: 40.0000 mg | ORAL_TABLET | Freq: Every day | ORAL | Status: DC

## 2014-04-25 MED ORDER — LEVOTHYROXINE SODIUM 88 MCG PO TABS: 88.0000 ug | ORAL_TABLET | Freq: Every day | ORAL | Status: DC

## 2014-04-25 NOTE — Telephone Encounter (Signed)
Rx was sent to pharmacy electronically. 

## 2014-04-25 NOTE — Telephone Encounter (Signed)
Pt called in needing a refill on her Levothyroxine and Atorvastatin  Thanks

## 2014-04-30 ENCOUNTER — Emergency Department (HOSPITAL_BASED_OUTPATIENT_CLINIC_OR_DEPARTMENT_OTHER): Payer: Medicare Other

## 2014-04-30 ENCOUNTER — Emergency Department (HOSPITAL_BASED_OUTPATIENT_CLINIC_OR_DEPARTMENT_OTHER)
Admission: EM | Admit: 2014-04-30 | Discharge: 2014-04-30 | Disposition: A | Discharge status: Home / Self Care | Payer: Medicare Other | Attending: Emergency Medicine | Admitting: Emergency Medicine

## 2014-04-30 ENCOUNTER — Encounter (HOSPITAL_BASED_OUTPATIENT_CLINIC_OR_DEPARTMENT_OTHER): Payer: Self-pay | Admitting: Emergency Medicine

## 2014-04-30 DIAGNOSIS — Z7982 Long term (current) use of aspirin: Secondary | ICD-10-CM | POA: Insufficient documentation

## 2014-04-30 DIAGNOSIS — Y9301 Activity, walking, marching and hiking: Secondary | ICD-10-CM | POA: Diagnosis not present

## 2014-04-30 DIAGNOSIS — E039 Hypothyroidism, unspecified: Secondary | ICD-10-CM | POA: Diagnosis not present

## 2014-04-30 DIAGNOSIS — I4891 Unspecified atrial fibrillation: Secondary | ICD-10-CM | POA: Insufficient documentation

## 2014-04-30 DIAGNOSIS — I10 Essential (primary) hypertension: Secondary | ICD-10-CM | POA: Insufficient documentation

## 2014-04-30 DIAGNOSIS — W108XXA Fall (on) (from) other stairs and steps, initial encounter: Secondary | ICD-10-CM | POA: Insufficient documentation

## 2014-04-30 DIAGNOSIS — S81811A Laceration without foreign body, right lower leg, initial encounter: Secondary | ICD-10-CM | POA: Insufficient documentation

## 2014-04-30 DIAGNOSIS — E785 Hyperlipidemia, unspecified: Secondary | ICD-10-CM | POA: Diagnosis not present

## 2014-04-30 DIAGNOSIS — Y998 Other external cause status: Secondary | ICD-10-CM | POA: Diagnosis not present

## 2014-04-30 DIAGNOSIS — Z7901 Long term (current) use of anticoagulants: Secondary | ICD-10-CM | POA: Insufficient documentation

## 2014-04-30 DIAGNOSIS — S61412A Laceration without foreign body of left hand, initial encounter: Secondary | ICD-10-CM

## 2014-04-30 DIAGNOSIS — M479 Spondylosis, unspecified: Secondary | ICD-10-CM | POA: Insufficient documentation

## 2014-04-30 DIAGNOSIS — Z9889 Other specified postprocedural states: Secondary | ICD-10-CM | POA: Diagnosis not present

## 2014-04-30 DIAGNOSIS — Z79899 Other long term (current) drug therapy: Secondary | ICD-10-CM | POA: Diagnosis not present

## 2014-04-30 DIAGNOSIS — R011 Cardiac murmur, unspecified: Secondary | ICD-10-CM | POA: Insufficient documentation

## 2014-04-30 DIAGNOSIS — Y9289 Other specified places as the place of occurrence of the external cause: Secondary | ICD-10-CM | POA: Insufficient documentation

## 2014-04-30 DIAGNOSIS — Z7952 Long term (current) use of systemic steroids: Secondary | ICD-10-CM | POA: Insufficient documentation

## 2014-04-30 DIAGNOSIS — I2510 Atherosclerotic heart disease of native coronary artery without angina pectoris: Secondary | ICD-10-CM | POA: Insufficient documentation

## 2014-04-30 DIAGNOSIS — Z955 Presence of coronary angioplasty implant and graft: Secondary | ICD-10-CM | POA: Insufficient documentation

## 2014-04-30 DIAGNOSIS — I509 Heart failure, unspecified: Secondary | ICD-10-CM | POA: Insufficient documentation

## 2014-04-30 DIAGNOSIS — R52 Pain, unspecified: Secondary | ICD-10-CM

## 2014-04-30 MED ORDER — LIDOCAINE-EPINEPHRINE (PF) 2 %-1:200000 IJ SOLN
INTRAMUSCULAR | Status: AC
  Administered 2014-04-30: 10 mL
  Filled 2014-04-30: qty 20

## 2014-04-30 MED ORDER — LIDOCAINE-EPINEPHRINE (PF) 2 %-1:200000 IJ SOLN
10.0000 mL | Freq: Once | INTRAMUSCULAR | Status: AC
  Administered 2014-04-30: 10 mL

## 2014-04-30 NOTE — ED Notes (Signed)
Pt presents to ED with complaints of laceration to rt lower leg and left hand. PT state she was walking up the stairs and missed the last step.

## 2014-04-30 NOTE — Discharge Instructions (Signed)
Please read and follow all provided instructions.  Your diagnoses today include:  1. Hand laceration, left, initial encounter   2. Pain from laceration   3. Laceration of right leg excluding thigh, initial encounter     Tests performed today include:  X-ray of the affected area that did not show any foreign bodies or broken bones  Vital signs. See below for your results today.   Medications prescribed:   None  Take any prescribed medications only as directed.   Home care instructions:  Follow any educational materials and wound care instructions contained in this packet.   Keep affected area above the level of your heart when possible to minimize swelling. Wash area gently twice a day with warm soapy water. Do not apply alcohol or hydrogen peroxide. Cover the area if it draining or weeping.   Follow-up instructions: Suture Removal: Return to the Emergency Department or see your primary care care doctor in 7-10 days for a recheck of your wound and removal of your sutures or staples.    Return instructions:  Return to the Emergency Department if you have:  Fever  Worsening pain  Worsening swelling of the wound  Pus draining from the wound  Redness of the skin that moves away from the wound, especially if it streaks away from the affected area   Any other emergent concerns  Your vital signs today were: BP 163/67 mmHg   Pulse 61   Temp(Src) 98 F (36.7 C) (Oral)   Resp 20   Ht 5\' 3"  (1.6 m)   Wt 135 lb (61.236 kg)   BMI 23.92 kg/m2   SpO2 97% If your blood pressure (BP) was elevated above 135/85 this visit, please have this repeated by your doctor within one month. --------------

## 2014-04-30 NOTE — ED Provider Notes (Signed)
CSN: [id number]     Arrival date & time 04/30/14  1141 History   First MD Initiated Contact with Patient 04/30/14 1159     Chief Complaint  Patient presents with  . Extremity Laceration     (Consider location/radiation/quality/duration/timing/severity/associated sxs/prior Treatment) HPI Comments: Patient with history of atrial fibrillation on Coumadin and baby aspirin presents with complaint of laceration to her left hand and right lower extremity. Patient was carrying 2 glasses of pears when she tripped on a cement step. The glass broke but she is unsure if her hand was cut by glass. She struck her lower extremity on the step immediately had bleeding. Patient put a rubber band around her leg to help control bleeding. Patient also noted bleeding coming from her left hand. Pressure was applied and bleeding was controlled. Patient denies numbness and tingling in her affected foot or hand. No trouble with movement. She did not hit her head or hurt her neck. Her last INR check was approximately 1 week ago and it was 2.1 at that time. She has had a tetanus shot within the past 10 years.  The history is provided by the patient.    Past Medical History  Diagnosis Date  . CHF (congestive heart failure)     2D ECHO, 06/21/2011 - EF 55-60%  . Hypothyroidism   . Dyslipidemia   . Hypertension   . PAF (paroxysmal atrial fibrillation)   .  Acute respiratiory failure requiring BiPap 06/24/2011  . Aortic valvular stenosis, moderate to severe 06/24/2011  . Atrial fibrillation   . Allergy   . Heart murmur   . Coronary artery disease   . Exertional shortness of breath   . Arthritis     "back" (10/30/2012)   Past Surgical History  Procedure Laterality Date  . Cardioversion  06/19/2011    Procedure: CARDIOVERSION;  Surgeon: Sanda Klein, MD;  Location: [facility];  Service: Cardiovascular;  Laterality: N/A;  . Appendectomy    . Angioplasty    . Cataract extraction w/ intraocular lens  implant, bilateral     . Lumbar disc surgery      "had to go in twice and clean out scar tissue" (10/30/2012)  . Coronary angioplasty with stent placement      "1" (10/30/2012)  . Dilation and curettage of uterus    . Excisional hemorrhoidectomy    . Wrist fracture surgery Left   . Shoulder acromioplasty Left     "fell; put a new ball in" (10/30/2012)  . Cardioversion  07/11/2010    Successful coversion to sinus rhythm  . Cardiac catheterization  10/06/2009    Continue medical therapy  . Cardiac catheterization  04/07/2009    RCA stented with a 3x88mm stent resulting in a reduction of 90% narrowing to normal   Family History  Problem Relation Age of Onset  . Heart disease Brother   . Heart disease Brother   . Lung disease Brother   . Heart disease Mother 68  . Cancer Mother 28    Uterine  . Stroke Mother 68  . Heart disease Father   . Hypertension Child 34  . Alcohol abuse Child    History  Substance Use Topics  . Smoking status: Never Smoker   . Smokeless tobacco: Never Used  . Alcohol Use: No   OB History    No data available     Review of Systems  Constitutional: Negative for activity change.  Musculoskeletal: Positive for myalgias. Negative for back pain, joint swelling,  arthralgias and neck pain.  Skin: Positive for wound.  Neurological: Negative for weakness and numbness.      Allergies  Furosemide and Sulfa antibiotics  Home Medications   Prior to Admission medications   Medication Sig Start Date End Date Taking? Authorizing Provider  acetaminophen (TYLENOL) 650 MG CR tablet Take 1,950 mg by mouth daily.     Historical Provider, MD  amiodarone (PACERONE) 200 MG tablet Take 1 tablet (200 mg total) by mouth daily. 12/24/13   Mihai Croitoru, MD  aspirin EC 81 MG tablet Take 81 mg by mouth daily.    Historical Provider, MD  atorvastatin (LIPITOR) 40 MG tablet Take 1 tablet (40 mg total) by mouth daily. 04/25/14   Mihai Croitoru, MD  ezetimibe (ZETIA) 10 MG tablet Take 1 tablet (10 mg  total) by mouth daily. 12/24/13   Mihai Croitoru, MD  folic acid (FOLVITE) 1 MG tablet Take 2 tablets (2 mg total) by mouth daily. 12/24/13   Mihai Croitoru, MD  levothyroxine (SYNTHROID, LEVOTHROID) 88 MCG tablet Take 1 tablet (88 mcg total) by mouth daily. 04/25/14   Mihai Croitoru, MD  metoprolol tartrate (LOPRESSOR) 25 MG tablet Take 0.5 tablets (12.5 mg total) by mouth 2 (two) times daily. 02/16/14   Mihai Croitoru, MD  Multiple Vitamins-Minerals (CENTRUM SILVER) tablet Take 1 tablet by mouth daily.      Historical Provider, MD  potassium chloride SA (K-DUR,KLOR-CON) 20 MEQ tablet Take 0.5 tablets (10 mEq total) by mouth daily. 09/27/13   Mihai Croitoru, MD  torsemide (DEMADEX) 20 MG tablet Take 1 tablet (20 mg total) by mouth once. 03/29/14   Lorretta Harp, MD  triamcinolone cream (KENALOG) 0.1 % Apply 1 application topically 2 (two) times daily.  02/02/14   Historical Provider, MD  warfarin (COUMADIN) 5 MG tablet Take 1 tablet by mouth daily or as directed 12/24/13   Mihai Croitoru, MD   BP 163/67 mmHg  Pulse 61  Temp(Src) 98 F (36.7 C) (Oral)  Resp 20  Ht 5\' 3"  (1.6 m)  Wt 135 lb (61.236 kg)  BMI 23.92 kg/m2  SpO2 97%   Physical Exam  Constitutional: She appears well-developed and well-nourished.  HENT:  Head: Normocephalic and atraumatic.  Eyes: Pupils are equal, round, and reactive to light.  Neck: Normal range of motion. Neck supple.  Cardiovascular: Exam reveals no decreased pulses.   Musculoskeletal: She exhibits tenderness. She exhibits no edema.  Patient has full range of motion of extremities including right foot and left hand. She can make a fist. She has 5 out of 5 strength in all fingers including flexion. No distal numbness or tingling prior to anesthesia.  Neurological: She is alert. No sensory deficit.  Motor, sensation, and vascular distal to the injury is fully intact.   Skin: Skin is warm and dry.  R anterior lower leg: 1.5cm clean, hemostatic, superficial  laceration. No FB seen or palpated.   L palm: 2cm clean, hemostatic, full thickness laceration extending into subcutaneous tissue. No FB seen or palpated.   L palm: 34mm clean, hemostatic, superficial laceration. No FB seen or palpated.   Psychiatric: She has a normal mood and affect.  Nursing note and vitals reviewed.   ED Course  Procedures (including critical care time) Labs Review Labs Reviewed - No data to display  Imaging Review No results found.   EKG Interpretation None       12:31 PM Patient seen and examined. X-ray pending. Medications ordered. D/w Dr. Regenia Skeeter. No concern for  head or neck injury. Tetanus <10 yrs.   Vital signs reviewed and are as follows: BP 163/67 mmHg  Pulse 61  Temp(Src) 98 F (36.7 C) (Oral)  Resp 20  Ht 5\' 3"  (1.6 m)  Wt 135 lb (61.236 kg)  BMI 23.92 kg/m2  SpO2 97%  LACERATION REPAIR Performed by: Faustino Congress, Brixius PA-S under my supervision Authorized by: Faustino Congress Consent: Verbal consent obtained. Risks and benefits: risks, benefits and alternatives were discussed Consent given by: patient Patient identity confirmed: provided demographic data Prepped and Draped in normal sterile fashion Wound explored  Laceration Location: Right anterior lower leg    Laceration Length: 1.5 cm  No Foreign Bodies seen or palpated  Anesthesia: local infiltration  Local anesthetic: lidocaine 2% with epinephrine  Anesthetic total: 2 ml  Irrigation method: skin scrub with dermal cleanser Amount of cleaning: standard  Skin closure: 5-0 Prolene  Number of sutures: 3  Technique: simple interrupted  Patient tolerance: Patient tolerated the procedure well with no immediate complications.   LACERATION REPAIR Performed by: Faustino Congress, Brixius PA-S under my supervision Authorized by: Faustino Congress Consent: Verbal consent obtained. Risks and benefits: risks, benefits and alternatives were discussed Consent given by:  patient Patient identity confirmed: provided demographic data Prepped and Draped in normal sterile fashion Wound explored  Laceration Location: Left palm, thenar eminence   Laceration Length: 2 cm  No Foreign Bodies seen or palpated  Anesthesia: local infiltration  Local anesthetic: lidocaine  2% with epinephrine  Anesthetic total: 3 ml  Irrigation method:skin scrub with dermal cleanser syringe Amount of cleaning: standard  Skin closure: 5-0 Prolene   Number of sutures: 3   Technique: Simple interrupted   Patient tolerance: Patient tolerated the procedure well with no immediate complications.   LACERATION REPAIR Performed by: Faustino Congress, Brixius PA-S under my supervision Authorized by: Faustino Congress Consent: Verbal consent obtained. Risks and benefits: risks, benefits and alternatives were discussed Consent given by: patient Patient identity confirmed: provided demographic data Prepped and Draped in normal sterile fashion Wound explored  Laceration Location: Left palm   Laceration Length: 25mm  No Foreign Bodies seen or palpated  Anesthesia: local infiltration  Local anesthetic: lidocaine 2% with epinephrine  Anesthetic total: 1 ml  Irrigation method:skin scrub with dermal cleanser Amount of cleaning: standard  Skin closure: 5-0 Prolene   Number of sutures: 1  Technique: Simple Interrupted   Patient tolerance: Patient tolerated the procedure well with no immediate complications.   2:07 PM Pt informed of x-ray results. Patient counseled on wound care. Patient counseled on need to return or see PCP/urgent care for suture removal in 7-10 days. Patient was urged to return to the Emergency Department urgently with worsening pain, swelling, expanding erythema especially if it streaks away from the affected area, fever, or if they have any other concerns. Patient verbalized understanding.    MDM   Final diagnoses:  Hand laceration, left, initial  encounter  Laceration of right leg excluding thigh, initial encounter   Patient with lower leg and hand lacerations as described. Repaired without complication. No vascular, tendon injury suspected. No foreign body suspected or identified.    Carlisle Cater, PA-C 04/30/14 [place], MD 05/01/14 [phone]

## 2014-04-30 NOTE — ED Notes (Signed)
PA is in room with pt

## 2014-05-09 ENCOUNTER — Ambulatory Visit (INDEPENDENT_AMBULATORY_CARE_PROVIDER_SITE_OTHER): Payer: Medicare Other | Admitting: Family Medicine

## 2014-05-09 DIAGNOSIS — Z4802 Encounter for removal of sutures: Secondary | ICD-10-CM

## 2014-05-09 NOTE — Progress Notes (Signed)
   Subjective:    Patient ID: [name], female    DOB: 1924-04-21, 80 y.o.   MRN: [id number]  HPI She is here for a recheck and suture removal. She has had no difficulty from the sutures.   Review of Systems     Objective:   Physical Exam  Alert and in no distress. Sutures were evaluated and no evidence of infection is noted.      Assessment & Plan:  Visit for suture removal  sutures removed from her hand and from her left lower extremity.

## 2014-05-25 ENCOUNTER — Other Ambulatory Visit: Payer: Self-pay | Admitting: *Deleted

## 2014-05-25 ENCOUNTER — Ambulatory Visit (INDEPENDENT_AMBULATORY_CARE_PROVIDER_SITE_OTHER): Payer: Medicare Other | Admitting: Pharmacist Clinician (PhC)/ Clinical Pharmacy Specialist

## 2014-05-25 ENCOUNTER — Other Ambulatory Visit: Payer: Self-pay | Admitting: Pharmacist Clinician (PhC)/ Clinical Pharmacy Specialist

## 2014-05-25 DIAGNOSIS — I480 Paroxysmal atrial fibrillation: Secondary | ICD-10-CM

## 2014-05-25 LAB — POCT INR: INR: 2.6

## 2014-05-25 MED ORDER — TORSEMIDE 20 MG PO TABS: 20.0000 mg | ORAL_TABLET | Freq: Once | ORAL | Status: DC

## 2014-05-25 MED ORDER — ATORVASTATIN CALCIUM 40 MG PO TABS: 40.0000 mg | ORAL_TABLET | Freq: Every day | ORAL | Status: DC

## 2014-05-25 MED ORDER — TORSEMIDE 20 MG PO TABS: 20.0000 mg | ORAL_TABLET | Freq: Every day | ORAL | Status: DC

## 2014-05-25 NOTE — Telephone Encounter (Signed)
Rx(s) sent to pharmacy electronically.  

## 2014-05-31 ENCOUNTER — Other Ambulatory Visit: Payer: Self-pay | Admitting: *Deleted

## 2014-05-31 MED ORDER — TORSEMIDE 20 MG PO TABS: 20.0000 mg | ORAL_TABLET | Freq: Every day | ORAL | Status: DC

## 2014-06-03 ENCOUNTER — Other Ambulatory Visit: Payer: Self-pay | Admitting: Cardiovascular Disease

## 2014-06-06 ENCOUNTER — Encounter: Payer: Self-pay | Admitting: Cardiovascular Disease

## 2014-06-06 ENCOUNTER — Telehealth: Payer: Self-pay | Admitting: Cardiovascular Disease

## 2014-06-06 ENCOUNTER — Ambulatory Visit (INDEPENDENT_AMBULATORY_CARE_PROVIDER_SITE_OTHER): Payer: Medicare Other | Admitting: Pharmacist Clinician (PhC)/ Clinical Pharmacy Specialist

## 2014-06-06 ENCOUNTER — Ambulatory Visit (INDEPENDENT_AMBULATORY_CARE_PROVIDER_SITE_OTHER): Payer: Medicare Other | Admitting: Cardiovascular Disease

## 2014-06-06 VITALS — BP 118/64 | HR 81 | Wt 141.0 lb

## 2014-06-06 DIAGNOSIS — I2510 Atherosclerotic heart disease of native coronary artery without angina pectoris: Secondary | ICD-10-CM

## 2014-06-06 DIAGNOSIS — Z7901 Long term (current) use of anticoagulants: Secondary | ICD-10-CM

## 2014-06-06 DIAGNOSIS — I480 Paroxysmal atrial fibrillation: Secondary | ICD-10-CM

## 2014-06-06 DIAGNOSIS — I10 Essential (primary) hypertension: Secondary | ICD-10-CM

## 2014-06-06 DIAGNOSIS — I350 Nonrheumatic aortic (valve) stenosis: Secondary | ICD-10-CM

## 2014-06-06 DIAGNOSIS — I5032 Chronic diastolic (congestive) heart failure: Secondary | ICD-10-CM

## 2014-06-06 DIAGNOSIS — I4891 Unspecified atrial fibrillation: Secondary | ICD-10-CM

## 2014-06-06 NOTE — Telephone Encounter (Signed)
Pt reported for walk-in EKG check, this was turned into encounter for Dr. Sallyanne Kuster. See clinical notes from today's date.

## 2014-06-06 NOTE — Telephone Encounter (Signed)
OK. Suspect she is correct and this is AF

## 2014-06-06 NOTE — Telephone Encounter (Signed)
Pt called to report that her heart rate has been elevated. She states baseline rate of 50-60. States rates between 90-98 since Saturday.   She does not associate this w/ activity increase.  She denies symptoms w/ this but is concerned d/t PAF Hx.   She was cardioverted 2 years ago.  She is taking metoprolol and amiodarone as prescribed.   No recent med changes w/ exception of Furosemide changed to Torsemide.  I advised we could perform EKG today for her. Pt voiced agreement, and I have added her to nurse schedule.  Will route to Dr. Sallyanne Kuster for FYI and confer w/ him at patient visit.

## 2014-06-06 NOTE — Progress Notes (Signed)
Patient ID: [name], female   DOB: 11/19/1923, 81 y.o.   MRN: [id number]      Reason for office visit Symptomatic recurrent atrial fibrillation  [name] is an 81 year old woman with a history of coronary artery disease (bare-metal stent to right coronary artery 2010), preserved left ventricle systolic function, moderate to severe valvular aortic stenosis and history of recurrent paroxysmal atrial fibrillation. She required cardioversion in January 2013 and again in January 2014. She has not had symptoms of atrial fibrillation in 2 years. She remained symptomatic despite good ventricular rate control. In June 2014 she had an episode of acute heart failure exacerbation (diastolic, confirmed pseudo-normal mitral inflow on echo) despite being in normal rhythm. She has treated hypertension, hyperlipidemia and hypothyroidism  She noticed the onset of atrial fibrillation abruptly on Saturday. (She had a nurse wellness home visit on Thursday and had normal heart rhythm at the time). She has had substantial fatigue and exertional dyspnea since then. The arrhythmia has not been going on for well over 48 hours. Her echocardiogram confirms the presence of atrial fibrillation with controlled ventricular rate. There are no ischemic repolarization abnormalities. He is therapeutically anticoagulated. INR was in therapeutic range on December 30 and today the INR was 3.2.  Allergies  Allergen Reactions  . Furosemide Rash  . Sulfa Antibiotics Itching and Rash    Current Outpatient Prescriptions  Medication Sig Dispense Refill  . acetaminophen (TYLENOL) 650 MG CR tablet Take 1,950 mg by mouth daily.     Marland Kitchen amiodarone (PACERONE) 200 MG tablet Take 1 tablet (200 mg total) by mouth daily. 90 tablet 2  . aspirin EC 81 MG tablet Take 81 mg by mouth daily.    Marland Kitchen atorvastatin (LIPITOR) 40 MG tablet Take 1 tablet (40 mg total) by mouth daily. 90 tablet 1  . ezetimibe (ZETIA) 10 MG tablet Take 1 tablet (10 mg  total) by mouth daily. 90 tablet 2  . folic acid (FOLVITE) 1 MG tablet Take 2 tablets (2 mg total) by mouth daily. 180 tablet 2  . furosemide (LASIX) 40 MG tablet TAKE 1 TABLET DAILY 90 tablet 2  . levothyroxine (SYNTHROID, LEVOTHROID) 88 MCG tablet Take 1 tablet (88 mcg total) by mouth daily. 90 tablet 1  . metoprolol tartrate (LOPRESSOR) 25 MG tablet Take 0.5 tablets (12.5 mg total) by mouth 2 (two) times daily. 90 tablet 3  . Multiple Vitamins-Minerals (CENTRUM SILVER) tablet Take 1 tablet by mouth daily.      . potassium chloride SA (K-DUR,KLOR-CON) 20 MEQ tablet TAKE ONE-HALF (1/2) TABLET DAILY 45 tablet 2  . torsemide (DEMADEX) 20 MG tablet Take 1 tablet (20 mg total) by mouth daily. 90 tablet 2  . triamcinolone cream (KENALOG) 0.1 % Apply 1 application topically 2 (two) times daily.     Marland Kitchen warfarin (COUMADIN) 5 MG tablet Take 1 tablet by mouth daily or as directed 90 tablet 2   No current facility-administered medications for this visit.    Past Medical History  Diagnosis Date  . CHF (congestive heart failure)     2D ECHO, 06/21/2011 - EF 55-60%  . Hypothyroidism   . Dyslipidemia   . Hypertension   . PAF (paroxysmal atrial fibrillation)   .  Acute respiratiory failure requiring BiPap 06/24/2011  . Aortic valvular stenosis, moderate to severe 06/24/2011  . Atrial fibrillation   . Allergy   . Heart murmur   . Coronary artery disease   . Exertional shortness of breath   . Arthritis     "  back" (10/30/2012)    Past Surgical History  Procedure Laterality Date  . Cardioversion  06/19/2011    Procedure: CARDIOVERSION;  Surgeon: Sanda Klein, MD;  Location: [facility];  Service: Cardiovascular;  Laterality: N/A;  . Appendectomy    . Angioplasty    . Cataract extraction w/ intraocular lens  implant, bilateral    . Lumbar disc surgery      "had to go in twice and clean out scar tissue" (10/30/2012)  . Coronary angioplasty with stent placement      "1" (10/30/2012)  . Dilation and curettage  of uterus    . Excisional hemorrhoidectomy    . Wrist fracture surgery Left   . Shoulder acromioplasty Left     "fell; put a new ball in" (10/30/2012)  . Cardioversion  07/11/2010    Successful coversion to sinus rhythm  . Cardiac catheterization  10/06/2009    Continue medical therapy  . Cardiac catheterization  04/07/2009    RCA stented with a 3x75mm stent resulting in a reduction of 90% narrowing to normal    Family History  Problem Relation Age of Onset  . Heart disease Brother   . Heart disease Brother   . Lung disease Brother   . Heart disease Mother 44  . Cancer Mother 48    Uterine  . Stroke Mother 73  . Heart disease Father   . Hypertension Child 34  . Alcohol abuse Child     History   Social History  . Marital Status: Married    Spouse Name: N/A    Number of Children: 1  . Years of Education: N/A   Occupational History  . Retired [employer]   Social History Main Topics  . Smoking status: Never Smoker   . Smokeless tobacco: Never Used  . Alcohol Use: No  . Drug Use: No  . Sexual Activity: No   Other Topics Concern  . Not on file   Social History Narrative    Review of systems: Fatigue and exertional dyspnea, NYHA functional class II-III The patient specifically denies any chest pain at rest or with exertion, dyspnea at rest, orthopnea, paroxysmal nocturnal dyspnea, syncope, palpitations, focal neurological deficits, intermittent claudication, lower extremity edema, unexplained weight gain, cough, hemoptysis or wheezing.  The patient also denies abdominal pain, nausea, vomiting, dysphagia, diarrhea, constipation, polyuria, polydipsia, dysuria, hematuria, frequency, urgency, abnormal bleeding or bruising, fever, chills, unexpected weight changes, mood swings, change in skin or hair texture, change in voice quality, auditory or visual problems, allergic reactions or rashes, new musculoskeletal complaints other than usual "aches and  pains".   PHYSICAL EXAM BP 118/64 mmHg  Pulse 81  Wt 141 lb (63.957 kg) respiratory rate 20 General: Alert, oriented x3, no distress Head: no evidence of trauma, PERRL, EOMI, no exophtalmos or lid lag, no myxedema, no xanthelasma; normal ears, nose and oropharynx Neck: normal jugular venous pulsations and no hepatojugular reflux; brisk carotid pulses without delay and no carotid bruits Chest: clear to auscultation, no signs of consolidation by percussion or palpation, normal fremitus, symmetrical and full respiratory excursions Cardiovascular: normal position and quality of the apical impulse, irregular rhythm, normal first and second heart sounds, no murmurs, rubs or gallops Abdomen: no tenderness or distention, no masses by palpation, no abnormal pulsatility or arterial bruits, normal bowel sounds, no hepatosplenomegaly Extremities: no clubbing, cyanosis; 2+ pitting edema of the left calf and pretibial area; 2+ radial, ulnar and brachial pulses bilaterally; 2+ right femoral, posterior tibial and dorsalis pedis pulses;  2+ left femoral, posterior tibial and dorsalis pedis pulses; no subclavian or femoral bruits Neurological: grossly nonfocal   EKG: Atrial fibrillation, left axis deviation, no repolarization abnormalities   Lipid Panel     Component Value Date/Time   CHOL 139 01/26/2014 1210   TRIG 98 01/26/2014 1210   HDL 45 01/26/2014 1210   CHOLHDL 3.1 01/26/2014 1210   VLDL 20 01/26/2014 1210   LDLCALC 74 01/26/2014 1210    BMET    Component Value Date/Time   NA 140 01/26/2014 1210   K 4.6 01/26/2014 1210   CL 104 01/26/2014 1210   CO2 28 01/26/2014 1210   GLUCOSE 87 01/26/2014 1210   BUN 15 01/26/2014 1210   CREATININE 1.05 01/26/2014 1210   CREATININE 0.90 11/06/2012 0445   CALCIUM 9.0 01/26/2014 1210   GFRNONAA 56* 11/06/2012 0445   GFRAA 65* 11/06/2012 0445     ASSESSMENT AND PLAN Symptomatic atrial fibrillation The episode has now been ongoing for over 48  hours and is unlikely to stop by itself. The arrhythmia has led to shortness of breath/congestive heart failure in the past. We'll schedule her for cardioversion as soon as possible. Rate control is good. This is her first significant event in 2 years, overall amiodarone therapy seems to be effective and will continue. Normal liver function tests and TSH in September 2015. The cardioversion procedure has been fully reviewed with the patient and informed consent has been obtained.  (Acute on) Chronic diastolic heart failure No clinical signs of hypervolemia. Worsening dyspnea is likely arrhythmia related.  Moderate to severe aortic stenosis Most recent assessment of aortic valve area was 1.2 cm  Coronary artery disease status post remote bare-metal stent to right coronary artery Does not have angina pectoris at this time.  Hyperlipidemia Good parameters on lipid profile in September  Hypertension Good control  Orders Placed This Encounter  Procedures  . Cardioversion external  . Basic Metabolic Panel (BMET)  . CBC  . PTT  . EKG 12-Lead   No orders of the defined types were placed in this encounter.    Holli Humbles, MD, [place] [phone] office [phone] pager

## 2014-06-06 NOTE — Patient Instructions (Signed)
Your physician has recommended that you have a Cardioversion (DCCV). Electrical Cardioversion uses a jolt of electricity to your heart either through paddles or wired patches attached to your chest. This is a controlled, usually prescheduled, procedure. Defibrillation is done under light anesthesia in the hospital, and you usually go home the day of the procedure. This is done to get your heart back into a normal rhythm. You are not awake for the procedure. Please see the instruction sheet given to you today.  Your physician recommends that you return for lab work today (report to [employer] following visit).

## 2014-06-06 NOTE — Telephone Encounter (Signed)
Pt says her pulse is extremely high,it is 95.Please call asap to advise.

## 2014-06-07 LAB — CBC
HCT: 39.9 % (ref 36.0–46.0)
Hemoglobin: 13 g/dL (ref 12.0–15.0)
MCH: 30.9 pg (ref 26.0–34.0)
MCHC: 32.6 g/dL (ref 30.0–36.0)
MCV: 94.8 fL (ref 78.0–100.0)
MPV: 10.1 fL (ref 8.6–12.4)
PLATELETS: 302 10*3/uL (ref 150–400)
RBC: 4.21 MIL/uL (ref 3.87–5.11)
RDW: 14.8 % (ref 11.5–15.5)
WBC: 8.5 10*3/uL (ref 4.0–10.5)

## 2014-06-07 LAB — BASIC METABOLIC PANEL
BUN: 32 mg/dL — ABNORMAL HIGH (ref 6–23)
CALCIUM: 8.9 mg/dL (ref 8.4–10.5)
CHLORIDE: 105 meq/L (ref 96–112)
CO2: 32 meq/L (ref 19–32)
CREATININE: 1.3 mg/dL — AB (ref 0.50–1.10)
GLUCOSE: 84 mg/dL (ref 70–99)
Potassium: 4.8 mEq/L (ref 3.5–5.3)
Sodium: 144 mEq/L (ref 135–145)

## 2014-06-07 LAB — POCT INR: INR: 3.2

## 2014-06-07 LAB — APTT: APTT: 36 s (ref 24–37)

## 2014-06-08 ENCOUNTER — Telehealth: Payer: Self-pay | Admitting: Cardiovascular Disease

## 2014-06-08 ENCOUNTER — Other Ambulatory Visit: Payer: Self-pay | Admitting: *Deleted

## 2014-06-08 DIAGNOSIS — I480 Paroxysmal atrial fibrillation: Secondary | ICD-10-CM

## 2014-06-08 MED ORDER — SODIUM CHLORIDE 0.9 % IV SOLN: INTRAVENOUS | Status: DC

## 2014-06-08 NOTE — Telephone Encounter (Signed)
Pt notified, voiced understanding. She will be here around 10am tomorrow. Will put in on nurse calendar.

## 2014-06-08 NOTE — Telephone Encounter (Signed)
Please have her come in to office in AM for ECG. If Dr. Ruthann Cancer made a correct diagnosis, will cancel the procedure

## 2014-06-08 NOTE — Telephone Encounter (Signed)
Returned call to patient she stated she feels like her heart is back in rhythm.Stated pulse 63 B/P 110/56.Stated she is scheduled to be at [facility] tomorrow 06/09/14 at 1:00 pm for cardioversion.Stated she feels much better.Message sent to Dr.Croitoru for advice.

## 2014-06-08 NOTE — Telephone Encounter (Signed)
Pt called im stating that she is suppose to have a procedure tomorrow to get her heart back in rhythm and she says today her pulse has been in the 60s. Please call  Thanks

## 2014-06-09 ENCOUNTER — Ambulatory Visit (HOSPITAL_COMMUNITY): Admission: RE | Admit: 2014-06-09 | Payer: Medicare Other | Source: Ambulatory Visit | Admitting: Cardiovascular Disease

## 2014-06-09 ENCOUNTER — Encounter (HOSPITAL_COMMUNITY): Admission: RE | Payer: Self-pay | Source: Ambulatory Visit

## 2014-06-09 ENCOUNTER — Ambulatory Visit (INDEPENDENT_AMBULATORY_CARE_PROVIDER_SITE_OTHER): Payer: Medicare Other | Admitting: *Deleted

## 2014-06-09 VITALS — BP 109/62 | HR 56

## 2014-06-09 DIAGNOSIS — Z01818 Encounter for other preprocedural examination: Secondary | ICD-10-CM

## 2014-06-09 SURGERY — CARDIOVERSION
Anesthesia: Monitor Anesthesia Care

## 2014-06-09 NOTE — Progress Notes (Signed)
This was an EKG performed prior to cardioversion scheduled for today. Pt had called yesterday noting that her heart felt like it had returned to a regular rhythm. Dr. Sallyanne Kuster had spoken with her and advised her to come in for this EKG. Plan was to send home if EKG was normal and cancel cardioversion procedure. If she was still in atrial fibrillation, plan would remain to send her to hospital.  I performed this EKG, showed results to Dr. Debara Pickett who signed off on the EKG, and result was reported to Dr. Sallyanne Kuster at the hospital. Dr. Sallyanne Kuster advised me that he will cancel the procedure. No cardioversion indicated at this time.  I sent pt home.

## 2014-06-20 ENCOUNTER — Telehealth: Payer: Self-pay | Admitting: Pharmacist Clinician (PhC)/ Clinical Pharmacy Specialist

## 2014-06-20 NOTE — Telephone Encounter (Signed)
Stop torsemide, rigorously avoid salt, weigh daily and call us if she develops edema or dyspnea or weight gain > 5 lb.

## 2014-06-20 NOTE — Telephone Encounter (Signed)
Pt previously had rash, believed by dermatologist to be caused by furosemide (pt allergic to sulfa antibiotics).  Was switched to torsemide.  Called this am to report rash is back.  Unable to see dermatologist for 4-5 weeks.

## 2014-06-20 NOTE — Telephone Encounter (Signed)
Spoke with patient, gave her Dr. Loletha Grayer recommendations.  She is worried about stopping the fluid pill, but understands the concern.  Will keep her appt with dermatology for next month.  Understands the need to call if develops edema, dyspnea or weight gain >5 pounds.

## 2014-07-06 ENCOUNTER — Ambulatory Visit (INDEPENDENT_AMBULATORY_CARE_PROVIDER_SITE_OTHER): Payer: Medicare Other | Admitting: Pharmacist Clinician (PhC)/ Clinical Pharmacy Specialist

## 2014-07-06 DIAGNOSIS — Z7901 Long term (current) use of anticoagulants: Secondary | ICD-10-CM

## 2014-07-06 DIAGNOSIS — I480 Paroxysmal atrial fibrillation: Secondary | ICD-10-CM

## 2014-07-06 LAB — POCT INR: INR: 2.3

## 2014-08-09 ENCOUNTER — Telehealth: Payer: Self-pay | Admitting: Cardiovascular Disease

## 2014-08-09 ENCOUNTER — Other Ambulatory Visit: Payer: Self-pay | Admitting: Cardiology

## 2014-08-09 ENCOUNTER — Encounter: Payer: Self-pay | Admitting: Cardiology

## 2014-08-09 ENCOUNTER — Encounter: Payer: Self-pay | Admitting: *Deleted

## 2014-08-09 ENCOUNTER — Ambulatory Visit (INDEPENDENT_AMBULATORY_CARE_PROVIDER_SITE_OTHER): Payer: Medicare Other | Admitting: *Deleted

## 2014-08-09 ENCOUNTER — Ambulatory Visit (INDEPENDENT_AMBULATORY_CARE_PROVIDER_SITE_OTHER): Payer: Medicare Other | Admitting: Cardiology

## 2014-08-09 VITALS — BP 134/82 | HR 78 | Ht 63.0 in | Wt 144.0 lb

## 2014-08-09 DIAGNOSIS — I5032 Chronic diastolic (congestive) heart failure: Secondary | ICD-10-CM

## 2014-08-09 DIAGNOSIS — I480 Paroxysmal atrial fibrillation: Secondary | ICD-10-CM

## 2014-08-09 DIAGNOSIS — I350 Nonrheumatic aortic (valve) stenosis: Secondary | ICD-10-CM

## 2014-08-09 DIAGNOSIS — I2510 Atherosclerotic heart disease of native coronary artery without angina pectoris: Secondary | ICD-10-CM

## 2014-08-09 DIAGNOSIS — Z7901 Long term (current) use of anticoagulants: Secondary | ICD-10-CM

## 2014-08-09 DIAGNOSIS — E785 Hyperlipidemia, unspecified: Secondary | ICD-10-CM

## 2014-08-09 DIAGNOSIS — I2583 Coronary atherosclerosis due to lipid rich plaque: Secondary | ICD-10-CM

## 2014-08-09 DIAGNOSIS — I10 Essential (primary) hypertension: Secondary | ICD-10-CM

## 2014-08-09 LAB — CBC
HCT: 39.7 % (ref 36.0–46.0)
Hemoglobin: 12.8 g/dL (ref 12.0–15.0)
MCH: 30.5 pg (ref 26.0–34.0)
MCHC: 32.2 g/dL (ref 30.0–36.0)
MCV: 94.7 fL (ref 78.0–100.0)
MPV: 10.7 fL (ref 8.6–12.4)
Platelets: 243 10*3/uL (ref 150–400)
RBC: 4.19 MIL/uL (ref 3.87–5.11)
RDW: 14.3 % (ref 11.5–15.5)
WBC: 8.3 10*3/uL (ref 4.0–10.5)

## 2014-08-09 LAB — POCT INR: INR: 4.4

## 2014-08-09 NOTE — Assessment & Plan Note (Signed)
Patient has developed recurrent atrial fibrillation. She is symptomatic. Her INR has been therapeutic for greater than 21 days. I will arrange for elective cardioversion tomorrow. Continue amiodarone. Check chest x-ray, TSH and liver functions. Continue Coumadin. Check hemoglobin. Hopefully she will hold sinus rhythm following cardioversion.

## 2014-08-09 NOTE — Assessment & Plan Note (Signed)
Continue statin. 

## 2014-08-09 NOTE — Telephone Encounter (Signed)
Pt. States this is her 5th day in atrial fibb and it's really wearing her out , please advise

## 2014-08-09 NOTE — Assessment & Plan Note (Signed)
Continue present blood pressure medications. 

## 2014-08-09 NOTE — Assessment & Plan Note (Signed)
She will most likely need follow-up echoes in the future. I will leave this to Dr. Sallyanne Kuster.

## 2014-08-09 NOTE — Assessment & Plan Note (Signed)
euvolemic on examination. Continue present dose of diuretics. Check potassium and renal function. 

## 2014-08-09 NOTE — Telephone Encounter (Signed)
Pt. Placed on Dr. Jacalyn Lefevre sched at 3:15 today pt. informed

## 2014-08-09 NOTE — Progress Notes (Signed)
HPI: FU coronary artery disease (bare-metal stent to right coronary artery 2010), preserved left ventricle systolic function, moderate to severe valvular aortic stenosis and history of recurrent paroxysmal atrial fibrillation. She required cardioversion in January 2013 and again in January 2014. Patient developed recurrent atrial fibrillation in January 2016. Cardioversion planned but patient converted on her own. Note last echocardiogram in June 2014 showed normal LV function, grade 2 diastolic dysfunction, moderate aortic stenosis and biatrial enlargement. Note her INR on 07/06/2014 was 2.3. 4.4 today. Patient contacted the office and felt like she had developed recurrent atrial fibrillation and was placed on my schedule to be seen today. Patient describes fatigue. She notes palpitations. She has checked her heart rate and it is higher. No chest pain or syncope. No bleeding.  Current Outpatient Prescriptions  Medication Sig Dispense Refill  . acetaminophen (TYLENOL) 650 MG CR tablet Take 1,950 mg by mouth daily.     Marland Kitchen amiodarone (PACERONE) 200 MG tablet Take 1 tablet (200 mg total) by mouth daily. 90 tablet 2  . aspirin EC 81 MG tablet Take 81 mg by mouth daily.    Marland Kitchen atorvastatin (LIPITOR) 40 MG tablet Take 1 tablet (40 mg total) by mouth daily. 90 tablet 1  . ezetimibe (ZETIA) 10 MG tablet Take 1 tablet (10 mg total) by mouth daily. 90 tablet 2  . folic acid (FOLVITE) 1 MG tablet Take 2 tablets (2 mg total) by mouth daily. 180 tablet 2  . levothyroxine (SYNTHROID, LEVOTHROID) 88 MCG tablet Take 1 tablet (88 mcg total) by mouth daily. 90 tablet 1  . loratadine (CLARITIN) 10 MG tablet Take 10 mg by mouth daily.    . metoprolol tartrate (LOPRESSOR) 25 MG tablet Take 0.5 tablets (12.5 mg total) by mouth 2 (two) times daily. 90 tablet 3  . Multiple Vitamins-Minerals (CENTRUM SILVER) tablet Take 1 tablet by mouth daily.      . potassium chloride SA (K-DUR,KLOR-CON) 20 MEQ tablet TAKE ONE-HALF  (1/2) TABLET DAILY 45 tablet 2  . torsemide (DEMADEX) 20 MG tablet 20 mg every other day.     . triamcinolone cream (KENALOG) 0.1 % Apply 1 application topically 2 (two) times daily.     Marland Kitchen warfarin (COUMADIN) 5 MG tablet Take 1 tablet by mouth daily or as directed (Patient taking differently: Take 1.25-2.5 mg by mouth daily. 2.5mg  on Monday and Friday and 1.25mg  all other days) 90 tablet 2   No current facility-administered medications for this visit.     Past Medical History  Diagnosis Date  . CHF (congestive heart failure)     2D ECHO, 06/21/2011 - EF 55-60%  . Hypothyroidism   . Dyslipidemia   . Hypertension   . PAF (paroxysmal atrial fibrillation)   .  Acute respiratiory failure requiring BiPap 06/24/2011  . Aortic valvular stenosis, moderate to severe 06/24/2011  . Atrial fibrillation   . Allergy   . Heart murmur   . Coronary artery disease   . Exertional shortness of breath   . Arthritis     "back" (10/30/2012)    Past Surgical History  Procedure Laterality Date  . Cardioversion  06/19/2011    Procedure: CARDIOVERSION;  Surgeon: Sanda Klein, MD;  Location: [facility];  Service: Cardiovascular;  Laterality: N/A;  . Appendectomy    . Angioplasty    . Cataract extraction w/ intraocular lens  implant, bilateral    . Lumbar disc surgery      "had to go in twice and clean  out scar tissue" (10/30/2012)  . Coronary angioplasty with stent placement      "1" (10/30/2012)  . Dilation and curettage of uterus    . Excisional hemorrhoidectomy    . Wrist fracture surgery Left   . Shoulder acromioplasty Left     "fell; put a new ball in" (10/30/2012)  . Cardioversion  07/11/2010    Successful coversion to sinus rhythm  . Cardiac catheterization  10/06/2009    Continue medical therapy  . Cardiac catheterization  04/07/2009    RCA stented with a 3x5mm stent resulting in a reduction of 90% narrowing to normal    History   Social History  . Marital Status: Married    Spouse Name: N/A  .  Number of Children: 1  . Years of Education: N/A   Occupational History  . Retired [employer]   Social History Main Topics  . Smoking status: Never Smoker   . Smokeless tobacco: Never Used  . Alcohol Use: No  . Drug Use: No  . Sexual Activity: No   Other Topics Concern  . Not on file   Social History Narrative    ROS: recent rash but no fevers or chills, productive cough, hemoptysis, dysphasia, odynophagia, melena, hematochezia, dysuria, hematuria, rash, seizure activity, orthopnea, PND, pedal edema, claudication. Remaining systems are negative.  Physical Exam: Well-developed well-nourished in no acute distress.  Skin is warm and dry. Ecchymosis noted. Rash over her abdomen. HEENT is normal.  Neck is supple.  Chest is clear to auscultation with normal expansion.  Cardiovascular exam is irregular Abdominal exam nontender or distended. No masses palpated. Extremities show no edema. neuro grossly intact  ECG atrial fibrillation at a rate of 78. Nonspecific ST changes.

## 2014-08-09 NOTE — Telephone Encounter (Signed)
Pt called in stating that she has been in Afib for 4 days and would like to be seen as soon as possible. Please call back  Thanks

## 2014-08-09 NOTE — Telephone Encounter (Signed)
Is there a way we can get her seen in [facility] today and set up for cardioversion tomorrow with me if truly in AF?

## 2014-08-09 NOTE — Patient Instructions (Signed)
Your physician recommends that you schedule a follow-up appointment in: [place] has recommended that you have a Cardioversion (DCCV). Electrical Cardioversion uses a jolt of electricity to your heart either through paddles or wired patches attached to your chest. This is a controlled, usually prescheduled, procedure. Defibrillation is done under light anesthesia in the hospital, and you usually go home the day of the procedure. This is done to get your heart back into a normal rhythm. You are not awake for the procedure. Please see the instruction sheet given to you today.   Your physician recommends that you return for lab work WHEN ABLE  A chest x-ray takes a picture of the organs and structures inside the chest, including the heart, lungs, and blood vessels. This test can show several things, including, whether the heart is enlarges; whether fluid is building up in the lungs; and whether pacemaker / defibrillator leads are still in place. [place]

## 2014-08-10 ENCOUNTER — Encounter (HOSPITAL_COMMUNITY): Payer: Self-pay | Admitting: *Deleted

## 2014-08-10 ENCOUNTER — Encounter (HOSPITAL_COMMUNITY)
Admission: RE | Disposition: A | Discharge status: Home / Self Care | Payer: Self-pay | Source: Ambulatory Visit | Attending: Cardiovascular Disease

## 2014-08-10 ENCOUNTER — Ambulatory Visit (HOSPITAL_COMMUNITY)
Admission: RE | Admit: 2014-08-10 | Discharge: 2014-08-10 | Disposition: A | Discharge status: Home / Self Care | Payer: Medicare Other | Source: Ambulatory Visit | Attending: Cardiovascular Disease | Admitting: Cardiovascular Disease

## 2014-08-10 ENCOUNTER — Ambulatory Visit (HOSPITAL_COMMUNITY): Payer: Medicare Other | Admitting: Anesthesiology

## 2014-08-10 DIAGNOSIS — I480 Paroxysmal atrial fibrillation: Secondary | ICD-10-CM | POA: Insufficient documentation

## 2014-08-10 DIAGNOSIS — I5032 Chronic diastolic (congestive) heart failure: Secondary | ICD-10-CM | POA: Insufficient documentation

## 2014-08-10 DIAGNOSIS — Z7982 Long term (current) use of aspirin: Secondary | ICD-10-CM | POA: Insufficient documentation

## 2014-08-10 DIAGNOSIS — Z7901 Long term (current) use of anticoagulants: Secondary | ICD-10-CM | POA: Insufficient documentation

## 2014-08-10 DIAGNOSIS — M1990 Unspecified osteoarthritis, unspecified site: Secondary | ICD-10-CM | POA: Insufficient documentation

## 2014-08-10 DIAGNOSIS — Z9842 Cataract extraction status, left eye: Secondary | ICD-10-CM | POA: Insufficient documentation

## 2014-08-10 DIAGNOSIS — E785 Hyperlipidemia, unspecified: Secondary | ICD-10-CM | POA: Diagnosis not present

## 2014-08-10 DIAGNOSIS — Z9049 Acquired absence of other specified parts of digestive tract: Secondary | ICD-10-CM | POA: Diagnosis not present

## 2014-08-10 DIAGNOSIS — Z955 Presence of coronary angioplasty implant and graft: Secondary | ICD-10-CM | POA: Insufficient documentation

## 2014-08-10 DIAGNOSIS — I2510 Atherosclerotic heart disease of native coronary artery without angina pectoris: Secondary | ICD-10-CM | POA: Diagnosis not present

## 2014-08-10 DIAGNOSIS — I10 Essential (primary) hypertension: Secondary | ICD-10-CM | POA: Insufficient documentation

## 2014-08-10 DIAGNOSIS — Z9841 Cataract extraction status, right eye: Secondary | ICD-10-CM | POA: Diagnosis not present

## 2014-08-10 DIAGNOSIS — E039 Hypothyroidism, unspecified: Secondary | ICD-10-CM | POA: Diagnosis not present

## 2014-08-10 DIAGNOSIS — I4891 Unspecified atrial fibrillation: Secondary | ICD-10-CM | POA: Diagnosis present

## 2014-08-10 DIAGNOSIS — I350 Nonrheumatic aortic (valve) stenosis: Secondary | ICD-10-CM | POA: Insufficient documentation

## 2014-08-10 HISTORY — PX: CARDIOVERSION: SHX1299

## 2014-08-10 LAB — COMPREHENSIVE METABOLIC PANEL
ALT: 21 U/L (ref 0–35)
AST: 26 U/L (ref 0–37)
Albumin: 4 g/dL (ref 3.5–5.2)
Alkaline Phosphatase: 66 U/L (ref 39–117)
BILIRUBIN TOTAL: 0.8 mg/dL (ref 0.2–1.2)
BUN: 23 mg/dL (ref 6–23)
CALCIUM: 8.8 mg/dL (ref 8.4–10.5)
CHLORIDE: 105 meq/L (ref 96–112)
CO2: 29 mEq/L (ref 19–32)
CREATININE: 1.16 mg/dL — AB (ref 0.50–1.10)
GLUCOSE: 93 mg/dL (ref 70–99)
Potassium: 4.8 mEq/L (ref 3.5–5.3)
Sodium: 142 mEq/L (ref 135–145)
Total Protein: 6.6 g/dL (ref 6.0–8.3)

## 2014-08-10 LAB — TSH: TSH: 0.967 u[IU]/mL (ref 0.350–4.500)

## 2014-08-10 SURGERY — CARDIOVERSION
Anesthesia: Monitor Anesthesia Care

## 2014-08-10 MED ORDER — PROPOFOL 10 MG/ML IV BOLUS
INTRAVENOUS | Status: DC | PRN
  Administered 2014-08-10: 10 mg via INTRAVENOUS
  Administered 2014-08-10: 40 mg via INTRAVENOUS

## 2014-08-10 MED ORDER — AMIODARONE HCL 200 MG PO TABS: 400.0000 mg | ORAL_TABLET | Freq: Every day | ORAL | Status: DC

## 2014-08-10 MED ORDER — SODIUM CHLORIDE 0.9 % IV SOLN
250.0000 mL | INTRAVENOUS | Status: DC
  Administered 2014-08-10: 250 mL via INTRAVENOUS

## 2014-08-10 MED ORDER — SODIUM CHLORIDE 0.9 % IJ SOLN: 3.0000 mL | INTRAMUSCULAR | Status: DC | PRN

## 2014-08-10 MED ORDER — SODIUM CHLORIDE 0.9 % IV SOLN
INTRAVENOUS | Status: DC | PRN
  Administered 2014-08-10 (×2): via INTRAVENOUS

## 2014-08-10 MED ORDER — LIDOCAINE HCL (CARDIAC) 20 MG/ML IV SOLN
INTRAVENOUS | Status: DC | PRN
  Administered 2014-08-10: 60 mg via INTRAVENOUS

## 2014-08-10 MED ORDER — SODIUM CHLORIDE 0.9 % IJ SOLN: 3.0000 mL | Freq: Two times a day (BID) | INTRAMUSCULAR | Status: DC

## 2014-08-10 NOTE — Anesthesia Postprocedure Evaluation (Signed)
  Anesthesia Post-op Note  Patient: [name]  Procedure(s) Performed: Procedure(s): CARDIOVERSION (N/A)  Patient Location: PACU  Anesthesia Type:MAC  Level of Consciousness: awake, alert  and oriented  Airway and Oxygen Therapy: Patient Spontanous Breathing  Post-op Pain: none  Post-op Assessment: Post-op Vital signs reviewed, Patient's Cardiovascular Status Stable, Respiratory Function Stable, Patent Airway and No signs of Nausea or vomiting  Post-op Vital Signs: Reviewed and stable  Last Vitals:  Filed Vitals:   08/10/14 1018  BP: 180/90  Pulse:   Resp: 25    Complications: No apparent anesthesia complications

## 2014-08-10 NOTE — Interval H&P Note (Signed)
History and Physical Interval Note:  08/10/2014 10:00 AM  [name]  has presented today for surgery, with the diagnosis of A FIB   The various methods of treatment have been discussed with the patient and family. After consideration of risks, benefits and other options for treatment, the patient has consented to  Procedure(s): CARDIOVERSION (N/A) as a surgical intervention .  The patient's history has been reviewed, patient examined, no change in status, stable for surgery.  I have reviewed the patient's chart and labs.  Questions were answered to the patient's satisfaction.     [name]

## 2014-08-10 NOTE — Transfer of Care (Signed)
Immediate Anesthesia Transfer of Care Note  Patient: [name]  Procedure(s) Performed: Procedure(s): CARDIOVERSION (N/A)  Patient Location: PACU  Anesthesia Type:MAC  Level of Consciousness: awake, alert  and oriented  Airway & Oxygen Therapy: Patient Spontanous Breathing and Patient connected to nasal cannula oxygen  Post-op Assessment: Report given to RN, Post -op Vital signs reviewed and stable and Patient moving all extremities  Post vital signs: Reviewed and stable  Last Vitals:  Filed Vitals:   08/10/14 1018  BP: 180/90  Pulse:   Resp: 25    Complications: No apparent anesthesia complications

## 2014-08-10 NOTE — CV Procedure (Signed)
Procedure: Electrical Cardioversion Indications:  Atrial Fibrillation  Procedure Details:  Consent: Risks of procedure as well as the alternatives and risks of each were explained to the (patient/caregiver).  Consent for procedure obtained.  Time Out: Verified patient identification, verified procedure, site/side was marked, verified correct patient position, special equipment/implants available, medications/allergies/relevent history reviewed, required imaging and test results available.  Performed  Patient placed on cardiac monitor, pulse oximetry, supplemental oxygen as necessary.  Sedation given: propofol IV, Anesthesiology Pacer pads placed anterior and posterior chest.  Cardioverted 1 time(s).  Cardioverted at 120 J synchronized biphasic.  Evaluation: Findings: Post procedure EKG shows: NSR 60 bpm Complications: None Patient did tolerate procedure well.  Time Spent Directly with the Patient:  45 minutes   [name] 08/10/2014, 10:39 AM

## 2014-08-10 NOTE — OR Nursing (Addendum)
Patient enter procedure with a raised papular red rash covering back chest and legs.  MD aware  Medications changed as followed, per/verified by Dr Sallyanne Kuster:  -(increased) Amiodarone 200 mg, 2 tabs po QD  -(decreased) Warfarin 5 mg, 1/2 tab Mon, Fri; 1/4 tab Sun, Tues, Wed, Thurs, Sat  -Torsemide 20 mg tab discontinued, as thought r/t rash

## 2014-08-10 NOTE — Anesthesia Postprocedure Evaluation (Signed)
  Anesthesia Post-op Note  Patient: [name]  Procedure(s) Performed: Procedure(s): CARDIOVERSION (N/A)  Patient Location: Endoscopy Unit  Anesthesia Type:MAC  Level of Consciousness: awake, alert  and oriented  Airway and Oxygen Therapy: Patient Spontanous Breathing and Patient connected to nasal cannula oxygen  Post-op Pain: none  Post-op Assessment: Post-op Vital signs reviewed, Patient's Cardiovascular Status Stable, Respiratory Function Stable and Patent Airway  Post-op Vital Signs: Reviewed and stable  Last Vitals:  Filed Vitals:   08/10/14 1018  BP: 180/90  Pulse:   Resp: 25    Complications: No apparent anesthesia complications

## 2014-08-10 NOTE — H&P (View-Only) (Signed)
HPI: FU coronary artery disease (bare-metal stent to right coronary artery 2010), preserved left ventricle systolic function, moderate to severe valvular aortic stenosis and history of recurrent paroxysmal atrial fibrillation. She required cardioversion in January 2013 and again in January 2014. Patient developed recurrent atrial fibrillation in January 2016. Cardioversion planned but patient converted on her own. Note last echocardiogram in June 2014 showed normal LV function, grade 2 diastolic dysfunction, moderate aortic stenosis and biatrial enlargement. Note her INR on 07/06/2014 was 2.3. 4.4 today. Patient contacted the office and felt like she had developed recurrent atrial fibrillation and was placed on my schedule to be seen today. Patient describes fatigue. She notes palpitations. She has checked her heart rate and it is higher. No chest pain or syncope. No bleeding.  Current Outpatient Prescriptions  Medication Sig Dispense Refill  . acetaminophen (TYLENOL) 650 MG CR tablet Take 1,950 mg by mouth daily.     Marland Kitchen amiodarone (PACERONE) 200 MG tablet Take 1 tablet (200 mg total) by mouth daily. 90 tablet 2  . aspirin EC 81 MG tablet Take 81 mg by mouth daily.    Marland Kitchen atorvastatin (LIPITOR) 40 MG tablet Take 1 tablet (40 mg total) by mouth daily. 90 tablet 1  . ezetimibe (ZETIA) 10 MG tablet Take 1 tablet (10 mg total) by mouth daily. 90 tablet 2  . folic acid (FOLVITE) 1 MG tablet Take 2 tablets (2 mg total) by mouth daily. 180 tablet 2  . levothyroxine (SYNTHROID, LEVOTHROID) 88 MCG tablet Take 1 tablet (88 mcg total) by mouth daily. 90 tablet 1  . loratadine (CLARITIN) 10 MG tablet Take 10 mg by mouth daily.    . metoprolol tartrate (LOPRESSOR) 25 MG tablet Take 0.5 tablets (12.5 mg total) by mouth 2 (two) times daily. 90 tablet 3  . Multiple Vitamins-Minerals (CENTRUM SILVER) tablet Take 1 tablet by mouth daily.      . potassium chloride SA (K-DUR,KLOR-CON) 20 MEQ tablet TAKE ONE-HALF  (1/2) TABLET DAILY 45 tablet 2  . torsemide (DEMADEX) 20 MG tablet 20 mg every other day.     . triamcinolone cream (KENALOG) 0.1 % Apply 1 application topically 2 (two) times daily.     Marland Kitchen warfarin (COUMADIN) 5 MG tablet Take 1 tablet by mouth daily or as directed (Patient taking differently: Take 1.25-2.5 mg by mouth daily. 2.5mg  on Monday and Friday and 1.25mg  all other days) 90 tablet 2   No current facility-administered medications for this visit.     Past Medical History  Diagnosis Date  . CHF (congestive heart failure)     2D ECHO, 06/21/2011 - EF 55-60%  . Hypothyroidism   . Dyslipidemia   . Hypertension   . PAF (paroxysmal atrial fibrillation)   .  Acute respiratiory failure requiring BiPap 06/24/2011  . Aortic valvular stenosis, moderate to severe 06/24/2011  . Atrial fibrillation   . Allergy   . Heart murmur   . Coronary artery disease   . Exertional shortness of breath   . Arthritis     "back" (10/30/2012)    Past Surgical History  Procedure Laterality Date  . Cardioversion  06/19/2011    Procedure: CARDIOVERSION;  Surgeon: Sanda Klein, MD;  Location: [place] [facility];  Service: Cardiovascular;  Laterality: N/A;  . Appendectomy    . Angioplasty    . Cataract extraction w/ intraocular lens  implant, bilateral    . Lumbar disc surgery      "had to go in twice and clean  out scar tissue" (10/30/2012)  . Coronary angioplasty with stent placement      "1" (10/30/2012)  . Dilation and curettage of uterus    . Excisional hemorrhoidectomy    . Wrist fracture surgery Left   . Shoulder acromioplasty Left     "fell; put a new ball in" (10/30/2012)  . Cardioversion  07/11/2010    Successful coversion to sinus rhythm  . Cardiac catheterization  10/06/2009    Continue medical therapy  . Cardiac catheterization  04/07/2009    RCA stented with a 3x29mm stent resulting in a reduction of 90% narrowing to normal    History   Social History  . Marital Status: Married    Spouse Name: N/A  .  Number of Children: 1  . Years of Education: N/A   Occupational History  . Retired [employer]   Social History Main Topics  . Smoking status: Never Smoker   . Smokeless tobacco: Never Used  . Alcohol Use: No  . Drug Use: No  . Sexual Activity: No   Other Topics Concern  . Not on file   Social History Narrative    ROS: recent rash but no fevers or chills, productive cough, hemoptysis, dysphasia, odynophagia, melena, hematochezia, dysuria, hematuria, rash, seizure activity, orthopnea, PND, pedal edema, claudication. Remaining systems are negative.  Physical Exam: Well-developed well-nourished in no acute distress.  Skin is warm and dry. Ecchymosis noted. Rash over her abdomen. HEENT is normal.  Neck is supple.  Chest is clear to auscultation with normal expansion.  Cardiovascular exam is irregular Abdominal exam nontender or distended. No masses palpated. Extremities show no edema. neuro grossly intact  ECG atrial fibrillation at a rate of 78. Nonspecific ST changes.

## 2014-08-10 NOTE — Anesthesia Preprocedure Evaluation (Addendum)
Anesthesia Evaluation  Patient identified by MRN, date of birth, ID band  Reviewed: Allergy & Precautions, NPO status , Patient's Chart, lab work & pertinent test results  Airway Mallampati: II  TM Distance: >3 FB Neck ROM: Full    Dental   Pulmonary shortness of breath,  breath sounds clear to auscultation        Cardiovascular hypertension, Pt. on home beta blockers + CAD and +CHF + Valvular Problems/Murmurs AS Rhythm:Irregular Rate:Normal     Neuro/Psych    GI/Hepatic   Endo/Other  Hypothyroidism   Renal/GU      Musculoskeletal  (+) Arthritis -,   Abdominal   Peds  Hematology   Anesthesia Other Findings   Reproductive/Obstetrics                          Anesthesia Physical Anesthesia Plan  ASA: III  Anesthesia Plan: MAC   Post-op Pain Management:    Induction: Intravenous  Airway Management Planned: Simple Face Mask  Additional Equipment:   Intra-op Plan:   Post-operative Plan:   Informed Consent: I have reviewed the patients History and Physical, chart, labs and discussed the procedure including the risks, benefits and alternatives for the proposed anesthesia with the patient or authorized representative who has indicated his/her understanding and acceptance.   Dental advisory given  Plan Discussed with: Anesthesiologist, CRNA and Surgeon  Anesthesia Plan Comments:        Anesthesia Quick Evaluation                                  Anesthesia Evaluation  Patient identified by MRN, date of birth, ID band Patient awake    Reviewed: Allergy & Precautions, H&P , NPO status , Patient's Chart, lab work & pertinent test results, reviewed documented beta blocker date and time   Airway Mallampati: II TM Distance: >3 FB Neck ROM: Full    Dental  (+) Teeth Intact, Implants and Dental Advisory Given   Pulmonary  clear to auscultation         Cardiovascular hypertension, Pt. on medications and Pt. on home beta blockers +CHF + dysrhythmias Atrial Fibrillation Irregular Normal+ Systolic murmurs EF 62%   Neuro/Psych    GI/Hepatic   Endo/Other  Hypothyroidism   Renal/GU      Musculoskeletal   Abdominal   Peds  Hematology   Anesthesia Other Findings   Reproductive/Obstetrics                        Anesthesia Physical Anesthesia Plan  ASA: III  Anesthesia Plan: General   Post-op Pain Management:    Induction: Intravenous  Airway Management Planned: Mask  Additional Equipment:   Intra-op Plan:   Post-operative Plan:   Informed Consent: I have reviewed the patients History and Physical, chart, labs and discussed the procedure including the risks, benefits and alternatives for the proposed anesthesia with the patient or authorized representative who has indicated his/her understanding and acceptance.   Dental advisory given  Plan Discussed with: CRNA, Anesthesiologist and Surgeon  Anesthesia Plan Comments:         Anesthesia Quick Evaluation

## 2014-08-10 NOTE — Anesthesia Procedure Notes (Signed)
Procedure Name: MAC Date/Time: 08/10/2014 10:31 AM Performed by: Kyung Rudd Pre-anesthesia Checklist: Patient identified, Emergency Drugs available, Suction available, Patient being monitored and Timeout performed Patient Re-evaluated:Patient Re-evaluated prior to inductionOxygen Delivery Method: Ambu bag Preoxygenation: Pre-oxygenation with 100% oxygen Intubation Type: IV induction

## 2014-08-10 NOTE — Discharge Instructions (Signed)
Electrical Cardioversion °Electrical cardioversion is the delivery of a jolt of electricity to change the rhythm of the heart. Sticky patches or metal paddles are placed on the chest to deliver the electricity from a device. This is done to restore a normal rhythm. A rhythm that is too fast or not regular keeps the heart from pumping well. °Electrical cardioversion is done in an emergency if:  °· There is low or no blood pressure as a result of the heart rhythm.   °· Normal rhythm must be restored as fast as possible to protect the brain and heart from further damage.   °· It may save a life. °Cardioversion may be done for heart rhythms that are not immediately life threatening, such as atrial fibrillation or flutter, in which:  °· The heart is beating too fast or is not regular.   °· Medicine to change the rhythm has not worked.   °· It is safe to wait in order to allow time for preparation. °· Symptoms of the abnormal rhythm are bothersome. °· The risk of stroke and other serious problems can be reduced. °LET YOUR HEALTH CARE PROVIDER KNOW ABOUT:  °· Any allergies you have. °· All medicines you are taking, including vitamins, herbs, eye drops, creams, and over-the-counter medicines. °· Previous problems you or members of your family have had with the use of anesthetics.   °· Any blood disorders you have.   °· Previous surgeries you have had.   °· Medical conditions you have. °RISKS AND COMPLICATIONS  °Generally, this is a safe procedure. However, problems can occur and include:  °· Breathing problems related to the anesthetic used. °· A blood clot that breaks free and travels to other parts of your body. This could cause a stroke or other problems. The risk of this is lowered by use of blood-thinning medicine (anticoagulant) prior to the procedure. °· Cardiac arrest (rare). °BEFORE THE PROCEDURE  °· You may have tests to detect blood clots in your heart and to evaluate heart function.  °· You may start taking  anticoagulants so your blood does not clot as easily.   °· Medicines may be given to help stabilize your heart rate and rhythm. °PROCEDURE °· You will be given medicine through an IV tube to reduce discomfort and make you sleepy (sedative).   °· An electrical shock will be delivered. °AFTER THE PROCEDURE °Your heart rhythm will be watched to make sure it does not change.  °Document Released: 05/03/2002 Document Revised: 09/27/2013 Document Reviewed: 11/25/2012 °Exit[employer]® Patient Information ©2015 [employer]. This information is not intended to replace advice given to you by your health care provider. Make sure you discuss any questions you have with your health care provider. °Monitored Anesthesia Care °Monitored anesthesia care is an anesthesia service for a medical procedure. Anesthesia is the loss of the ability to feel pain. It is produced by medicines called anesthetics. It may affect a small area of your body (local anesthesia), a large area of your body (regional anesthesia), or your entire body (general anesthesia). The need for monitored anesthesia care depends your procedure, your condition, and the potential need for regional or general anesthesia. It is often provided during procedures where:  °· General anesthesia may be needed if there are complications. This is because you need special care when you are under general anesthesia.   °· You will be under local or regional anesthesia. This is so that you are able to have higher levels of anesthesia if needed.   °· You will receive calming medicines (sedatives). This is especially   the case if sedatives are given to put you in a semi-conscious state of relaxation (deep sedation). This is because the amount of sedative needed to produce this state can be hard to predict. Too much of a sedative can produce general anesthesia. Monitored anesthesia care is performed by one or more health care providers who have special training in all types of anesthesia.  You will need to meet with these health care providers before your procedure. During this meeting, they will ask you about your medical history. They will also give you instructions to follow. (For example, you will need to stop eating and drinking before your procedure. You may also need to stop or change medicines you are taking.) During your procedure, your health care providers will stay with you. They will:   Watch your condition. This includes watching your blood pressure, breathing, and level of pain.   Diagnose and treat problems that occur.   Give medicines if they are needed. These may include calming medicines (sedatives) and anesthetics.   Make sure you are comfortable.  Having monitored anesthesia care does not necessarily mean that you will be under anesthesia. It does mean that your health care providers will be able to manage anesthesia if you need it or if it occurs. It also means that you will be able to have a different type of anesthesia than you are having if you need it. When your procedure is complete, your health care providers will continue to watch your condition. They will make sure any medicines wear off before you are allowed to go home.  Document Released: 02/06/2005 Document Revised: 09/27/2013 Document Reviewed: 06/24/2012 [facility] Patient Information 2015 [place], [place]. This information is not intended to replace advice given to you by your health care provider. Make sure you discuss any questions you have with your health care provider.

## 2014-08-11 ENCOUNTER — Encounter (HOSPITAL_COMMUNITY): Payer: Self-pay | Admitting: Cardiovascular Disease

## 2014-08-12 ENCOUNTER — Telehealth: Payer: Self-pay | Admitting: Cardiovascular Disease

## 2014-08-14 ENCOUNTER — Encounter (HOSPITAL_BASED_OUTPATIENT_CLINIC_OR_DEPARTMENT_OTHER): Payer: Self-pay | Admitting: Emergency Medicine

## 2014-08-14 ENCOUNTER — Emergency Department (HOSPITAL_BASED_OUTPATIENT_CLINIC_OR_DEPARTMENT_OTHER): Payer: Medicare Other

## 2014-08-14 ENCOUNTER — Inpatient Hospital Stay (HOSPITAL_BASED_OUTPATIENT_CLINIC_OR_DEPARTMENT_OTHER)
Admission: EM | Admit: 2014-08-14 | Discharge: 2014-08-19 | DRG: 292 | Disposition: A | Discharge status: Home / Self Care | Payer: Medicare Other | Attending: Cardiology | Admitting: Cardiology

## 2014-08-14 ENCOUNTER — Other Ambulatory Visit (HOSPITAL_BASED_OUTPATIENT_CLINIC_OR_DEPARTMENT_OTHER): Payer: Self-pay

## 2014-08-14 DIAGNOSIS — E785 Hyperlipidemia, unspecified: Secondary | ICD-10-CM | POA: Diagnosis present

## 2014-08-14 DIAGNOSIS — I472 Ventricular tachycardia: Secondary | ICD-10-CM | POA: Diagnosis present

## 2014-08-14 DIAGNOSIS — I5033 Acute on chronic diastolic (congestive) heart failure: Secondary | ICD-10-CM | POA: Diagnosis not present

## 2014-08-14 DIAGNOSIS — I10 Essential (primary) hypertension: Secondary | ICD-10-CM | POA: Diagnosis present

## 2014-08-14 DIAGNOSIS — I447 Left bundle-branch block, unspecified: Secondary | ICD-10-CM

## 2014-08-14 DIAGNOSIS — M1990 Unspecified osteoarthritis, unspecified site: Secondary | ICD-10-CM | POA: Diagnosis present

## 2014-08-14 DIAGNOSIS — I350 Nonrheumatic aortic (valve) stenosis: Secondary | ICD-10-CM | POA: Diagnosis present

## 2014-08-14 DIAGNOSIS — N183 Chronic kidney disease, stage 3 (moderate): Secondary | ICD-10-CM | POA: Diagnosis present

## 2014-08-14 DIAGNOSIS — Z7901 Long term (current) use of anticoagulants: Secondary | ICD-10-CM

## 2014-08-14 DIAGNOSIS — R21 Rash and other nonspecific skin eruption: Secondary | ICD-10-CM

## 2014-08-14 DIAGNOSIS — E039 Hypothyroidism, unspecified: Secondary | ICD-10-CM | POA: Diagnosis present

## 2014-08-14 DIAGNOSIS — Z882 Allergy status to sulfonamides status: Secondary | ICD-10-CM | POA: Diagnosis not present

## 2014-08-14 DIAGNOSIS — Z7982 Long term (current) use of aspirin: Secondary | ICD-10-CM | POA: Diagnosis not present

## 2014-08-14 DIAGNOSIS — Z955 Presence of coronary angioplasty implant and graft: Secondary | ICD-10-CM | POA: Diagnosis not present

## 2014-08-14 DIAGNOSIS — I5021 Acute systolic (congestive) heart failure: Secondary | ICD-10-CM

## 2014-08-14 DIAGNOSIS — I110 Hypertensive heart disease with heart failure: Secondary | ICD-10-CM | POA: Diagnosis present

## 2014-08-14 DIAGNOSIS — I480 Paroxysmal atrial fibrillation: Secondary | ICD-10-CM | POA: Diagnosis not present

## 2014-08-14 DIAGNOSIS — I2510 Atherosclerotic heart disease of native coronary artery without angina pectoris: Secondary | ICD-10-CM | POA: Diagnosis present

## 2014-08-14 DIAGNOSIS — I129 Hypertensive chronic kidney disease with stage 1 through stage 4 chronic kidney disease, or unspecified chronic kidney disease: Secondary | ICD-10-CM | POA: Diagnosis present

## 2014-08-14 DIAGNOSIS — I509 Heart failure, unspecified: Secondary | ICD-10-CM | POA: Insufficient documentation

## 2014-08-14 DIAGNOSIS — I4439 Other atrioventricular block: Secondary | ICD-10-CM | POA: Diagnosis not present

## 2014-08-14 HISTORY — DX: Rash and other nonspecific skin eruption: R21

## 2014-08-14 HISTORY — DX: Chronic diastolic (congestive) heart failure: I50.32

## 2014-08-14 LAB — CBC WITH DIFFERENTIAL/PLATELET
BASOS ABS: 0 10*3/uL (ref 0.0–0.1)
Basophils Relative: 0 % (ref 0–1)
Eosinophils Absolute: 0.2 10*3/uL (ref 0.0–0.7)
Eosinophils Relative: 1 % (ref 0–5)
HEMATOCRIT: 42.6 % (ref 36.0–46.0)
HEMOGLOBIN: 13.5 g/dL (ref 12.0–15.0)
LYMPHS ABS: 2.3 10*3/uL (ref 0.7–4.0)
Lymphocytes Relative: 20 % (ref 12–46)
MCH: 31.5 pg (ref 26.0–34.0)
MCHC: 31.7 g/dL (ref 30.0–36.0)
MCV: 99.3 fL (ref 78.0–100.0)
MONO ABS: 1.1 10*3/uL — AB (ref 0.1–1.0)
Monocytes Relative: 10 % (ref 3–12)
NEUTROS ABS: 7.8 10*3/uL — AB (ref 1.7–7.7)
NEUTROS PCT: 69 % (ref 43–77)
Platelets: 210 10*3/uL (ref 150–400)
RBC: 4.29 MIL/uL (ref 3.87–5.11)
RDW: 13.8 % (ref 11.5–15.5)
WBC: 11.4 10*3/uL — ABNORMAL HIGH (ref 4.0–10.5)

## 2014-08-14 LAB — PROTIME-INR
INR: 1.25 (ref 0.00–1.49)
Prothrombin Time: 15.7 seconds — ABNORMAL HIGH (ref 11.6–15.2)

## 2014-08-14 LAB — BASIC METABOLIC PANEL
Anion gap: 9 (ref 5–15)
BUN: 18 mg/dL (ref 6–23)
CALCIUM: 9.2 mg/dL (ref 8.4–10.5)
CHLORIDE: 107 mmol/L (ref 96–112)
CO2: 26 mmol/L (ref 19–32)
Creatinine, Ser: 1.06 mg/dL (ref 0.50–1.10)
GFR calc Af Amer: 53 mL/min — ABNORMAL LOW (ref >90)
GFR, EST NON AFRICAN AMERICAN: 45 mL/min — AB (ref >90)
Glucose, Bld: 123 mg/dL — ABNORMAL HIGH (ref 70–99)
POTASSIUM: 3.6 mmol/L (ref 3.5–5.1)
SODIUM: 142 mmol/L (ref 135–145)

## 2014-08-14 LAB — TROPONIN I: Troponin I: <0.03 ng/mL (ref <0.031)

## 2014-08-14 LAB — MRSA PCR SCREENING: MRSA by PCR: NEGATIVE

## 2014-08-14 LAB — BRAIN NATRIURETIC PEPTIDE: B Natriuretic Peptide: 208.2 pg/mL — ABNORMAL HIGH (ref 0.0–100.0)

## 2014-08-14 MED ORDER — NITROGLYCERIN 2 % TD OINT
1.0000 [in_us] | TOPICAL_OINTMENT | Freq: Once | TRANSDERMAL | Status: AC
Start: 2014-08-14 — End: 2014-08-14
  Administered 2014-08-14: 1 [in_us] via TOPICAL
  Filled 2014-08-14: qty 1

## 2014-08-14 MED ORDER — SODIUM CHLORIDE 0.9 % IV SOLN: 250.0000 mL | INTRAVENOUS | Status: DC | PRN

## 2014-08-14 MED ORDER — ONDANSETRON HCL 4 MG/2ML IJ SOLN: 4.0000 mg | Freq: Four times a day (QID) | INTRAMUSCULAR | Status: DC | PRN

## 2014-08-14 MED ORDER — POTASSIUM CHLORIDE CRYS ER 10 MEQ PO TBCR
10.0000 meq | EXTENDED_RELEASE_TABLET | Freq: Every day | ORAL | Status: DC
  Administered 2014-08-14 – 2014-08-19 (×6): 10 meq via ORAL
  Filled 2014-08-14 (×6): qty 1

## 2014-08-14 MED ORDER — LEVALBUTEROL HCL 0.63 MG/3ML IN NEBU
INHALATION_SOLUTION | RESPIRATORY_TRACT | Status: AC
  Administered 2014-08-14: 0.63 mg via RESPIRATORY_TRACT
  Filled 2014-08-14: qty 3

## 2014-08-14 MED ORDER — AMIODARONE HCL 200 MG PO TABS
400.0000 mg | ORAL_TABLET | Freq: Every day | ORAL | Status: DC
  Administered 2014-08-14 – 2014-08-19 (×6): 400 mg via ORAL
  Filled 2014-08-14 (×6): qty 2

## 2014-08-14 MED ORDER — LEVOTHYROXINE SODIUM 88 MCG PO TABS
88.0000 ug | ORAL_TABLET | Freq: Every day | ORAL | Status: DC
  Administered 2014-08-14 – 2014-08-19 (×6): 88 ug via ORAL
  Filled 2014-08-14 (×7): qty 1

## 2014-08-14 MED ORDER — ATORVASTATIN CALCIUM 40 MG PO TABS
40.0000 mg | ORAL_TABLET | Freq: Every day | ORAL | Status: DC
  Administered 2014-08-14 – 2014-08-18 (×5): 40 mg via ORAL
  Filled 2014-08-14 (×7): qty 1

## 2014-08-14 MED ORDER — SODIUM CHLORIDE 0.9 % IJ SOLN: 3.0000 mL | INTRAMUSCULAR | Status: DC | PRN

## 2014-08-14 MED ORDER — ASPIRIN EC 81 MG PO TBEC
81.0000 mg | DELAYED_RELEASE_TABLET | Freq: Every day | ORAL | Status: DC
  Administered 2014-08-15 – 2014-08-18 (×4): 81 mg via ORAL
  Filled 2014-08-14 (×5): qty 1

## 2014-08-14 MED ORDER — LEVALBUTEROL HCL 0.63 MG/3ML IN NEBU
0.6300 mg | INHALATION_SOLUTION | RESPIRATORY_TRACT | Status: AC
  Administered 2014-08-14: 0.63 mg via RESPIRATORY_TRACT

## 2014-08-14 MED ORDER — WARFARIN SODIUM 2.5 MG PO TABS
2.5000 mg | ORAL_TABLET | Freq: Once | ORAL | Status: AC
  Administered 2014-08-14: 2.5 mg via ORAL
  Filled 2014-08-14: qty 1

## 2014-08-14 MED ORDER — LORATADINE 10 MG PO TABS
10.0000 mg | ORAL_TABLET | Freq: Every day | ORAL | Status: DC
  Administered 2014-08-14 – 2014-08-19 (×6): 10 mg via ORAL
  Filled 2014-08-14 (×6): qty 1

## 2014-08-14 MED ORDER — SODIUM CHLORIDE 0.9 % IJ SOLN
3.0000 mL | Freq: Two times a day (BID) | INTRAMUSCULAR | Status: DC
  Administered 2014-08-14 – 2014-08-19 (×10): 3 mL via INTRAVENOUS

## 2014-08-14 MED ORDER — TRIAMCINOLONE ACETONIDE 0.1 % EX CREA
1.0000 "application " | TOPICAL_CREAM | Freq: Two times a day (BID) | CUTANEOUS | Status: DC
  Administered 2014-08-14 – 2014-08-19 (×10): 1 via TOPICAL
  Filled 2014-08-14: qty 15

## 2014-08-14 MED ORDER — METOPROLOL TARTRATE 12.5 MG HALF TABLET
12.5000 mg | ORAL_TABLET | Freq: Two times a day (BID) | ORAL | Status: DC
  Administered 2014-08-14 – 2014-08-15 (×3): 12.5 mg via ORAL
  Filled 2014-08-14 (×4): qty 1

## 2014-08-14 MED ORDER — ACETAMINOPHEN 325 MG PO TABS
650.0000 mg | ORAL_TABLET | ORAL | Status: DC | PRN
  Administered 2014-08-17 – 2014-08-19 (×5): 650 mg via ORAL
  Filled 2014-08-14 (×5): qty 2

## 2014-08-14 MED ORDER — ADULT MULTIVITAMIN W/MINERALS CH
1.0000 | ORAL_TABLET | Freq: Every day | ORAL | Status: DC
  Administered 2014-08-15 – 2014-08-19 (×5): 1 via ORAL
  Filled 2014-08-14 (×5): qty 1

## 2014-08-14 MED ORDER — ETHACRYNIC ACID 25 MG PO TABS
50.0000 mg | ORAL_TABLET | Freq: Every day | ORAL | Status: DC
  Administered 2014-08-14 – 2014-08-15 (×2): 50 mg via ORAL
  Filled 2014-08-14 (×2): qty 2

## 2014-08-14 MED ORDER — CENTRUM SILVER PO TABS: 1.0000 | ORAL_TABLET | Freq: Every day | ORAL | Status: DC

## 2014-08-14 MED ORDER — FOLIC ACID 1 MG PO TABS
2.0000 mg | ORAL_TABLET | Freq: Every day | ORAL | Status: DC
  Administered 2014-08-14 – 2014-08-19 (×6): 2 mg via ORAL
  Filled 2014-08-14 (×6): qty 2

## 2014-08-14 MED ORDER — EZETIMIBE 10 MG PO TABS
10.0000 mg | ORAL_TABLET | Freq: Every day | ORAL | Status: DC
  Administered 2014-08-14 – 2014-08-19 (×6): 10 mg via ORAL
  Filled 2014-08-14 (×6): qty 1

## 2014-08-14 MED ORDER — WARFARIN - PHARMACIST DOSING INPATIENT: Freq: Every day | Status: DC

## 2014-08-14 MED ORDER — ASPIRIN 81 MG PO CHEW
324.0000 mg | CHEWABLE_TABLET | Freq: Once | ORAL | Status: AC
Start: 2014-08-14 — End: 2014-08-14
  Administered 2014-08-14: 324 mg via ORAL
  Filled 2014-08-14: qty 4

## 2014-08-14 NOTE — Discharge Instructions (Addendum)
**PLEASE REMEMBER TO BRING ALL OF YOUR MEDICATIONS TO EACH OF YOUR FOLLOW-UP OFFICE VISITS.   Information on my medicine - Coumadin   (Warfarin)  This medication education was reviewed with me or my healthcare representative as part of my discharge preparation.  The pharmacist that spoke with me during my hospital stay was:  Reginia Naas, [facility]  Why was Coumadin prescribed for you? Coumadin was prescribed for you because you have a blood clot or a medical condition that can cause an increased risk of forming blood clots. Blood clots can cause serious health problems by blocking the flow of blood to the heart, lung, or brain. Coumadin can prevent harmful blood clots from forming. As a reminder your indication for Coumadin is:   Stroke Prevention Because Of Atrial Fibrillation  What test will check on my response to Coumadin? While on Coumadin (warfarin) you will need to have an INR test regularly to ensure that your dose is keeping you in the desired range. The INR (international normalized ratio) number is calculated from the result of the laboratory test called prothrombin time (PT).  If an INR APPOINTMENT HAS NOT ALREADY BEEN MADE FOR YOU please schedule an appointment to have this lab work done by your health care provider within 7 days. Your INR goal is usually a number between:  2 to 3 or your provider may give you a more narrow range like 2-2.5.  Ask your health care provider during an office visit what your goal INR is.  What  do you need to  know  About  COUMADIN? Take Coumadin (warfarin) exactly as prescribed by your healthcare provider about the same time each day.  DO NOT stop taking without talking to the doctor who prescribed the medication.  Stopping without other blood clot prevention medication to take the place of Coumadin may increase your risk of developing a new clot or stroke.  Get refills before you run out.  What do you do if you miss a dose? If you miss a dose, take  it as soon as you remember on the same day then continue your regularly scheduled regimen the next day.  Do not take two doses of Coumadin at the same time.  Important Safety Information A possible side effect of Coumadin (Warfarin) is an increased risk of bleeding. You should call your healthcare provider right away if you experience any of the following: ? Bleeding from an injury or your nose that does not stop. ? Unusual colored urine (red or dark brown) or unusual colored stools (red or black). ? Unusual bruising for unknown reasons. ? A serious fall or if you hit your head (even if there is no bleeding).  Some foods or medicines interact with Coumadin (warfarin) and might alter your response to warfarin. To help avoid this: ? Eat a balanced diet, maintaining a consistent amount of Vitamin K. ? Notify your provider about major diet changes you plan to make. ? Avoid alcohol or limit your intake to 1 drink for women and 2 drinks for men per day. (1 drink is 5 oz. wine, 12 oz. beer, or 1.5 oz. liquor.)  Make sure that ANY health care provider who prescribes medication for you knows that you are taking Coumadin (warfarin).  Also make sure the healthcare provider who is monitoring your Coumadin knows when you have started a new medication including herbals and non-prescription products.  Coumadin (Warfarin)  Major Drug Interactions  Increased Warfarin Effect Decreased Warfarin Effect  Alcohol (large  quantities) Antibiotics (esp. Septra/Bactrim, Flagyl, Cipro) Amiodarone (Cordarone) Aspirin (ASA) Cimetidine (Tagamet) Megestrol (Megace) NSAIDs (ibuprofen, naproxen, etc.) Piroxicam (Feldene) Propafenone (Rythmol SR) Propranolol (Inderal) Isoniazid (INH) Posaconazole (Noxafil) Barbiturates (Phenobarbital) Carbamazepine (Tegretol) Chlordiazepoxide (Librium) Cholestyramine (Questran) Griseofulvin Oral Contraceptives Rifampin Sucralfate (Carafate) Vitamin K   Coumadin (Warfarin)  Major Herbal Interactions  Increased Warfarin Effect Decreased Warfarin Effect  Garlic Ginseng Ginkgo biloba Coenzyme Q10 Green tea St. Johns wort    Coumadin (Warfarin) FOOD Interactions  Eat a consistent number of servings per week of foods HIGH in Vitamin K (1 serving =  cup)  Collards (cooked, or boiled & drained) Kale (cooked, or boiled & drained) Mustard greens (cooked, or boiled & drained) Parsley *serving size only =  cup Spinach (cooked, or boiled & drained) Swiss chard (cooked, or boiled & drained) Turnip greens (cooked, or boiled & drained)  Eat a consistent number of servings per week of foods MEDIUM-HIGH in Vitamin K (1 serving = 1 cup)  Asparagus (cooked, or boiled & drained) Broccoli (cooked, boiled & drained, or raw & chopped) Brussel sprouts (cooked, or boiled & drained) *serving size only =  cup Lettuce, raw (green leaf, endive, romaine) Spinach, raw Turnip greens, raw & chopped   These websites have more information on Coumadin (warfarin):  FailFactory.se; VeganReport.com.au;

## 2014-08-14 NOTE — ED Notes (Signed)
pCXR finished at [facility]

## 2014-08-14 NOTE — ED Notes (Signed)
Recent cardioversion, pt in cardiac rehab, felt fine last night, recently stopped lasix d/t sulfa allergy, rash noted, "rash present since October", here this morning for sob, denies pain or other sx, cough noted (non-productive), audible wheeze noted, sob onset this am, here with husband, pt with increased wob, speaking in short phrases, alert, NAD, calm, interactive, answering questions, following commands, skin W&D. Takes coumadin.

## 2014-08-14 NOTE — ED Notes (Addendum)
Alert, NAD, calm, interactive, "feel about the same", VSS/improved, ntg paste in place L chest, RT at [facility], neb in progress, pending arrival of [facility].

## 2014-08-14 NOTE — ED Provider Notes (Signed)
CSN: [id number]     Arrival date & time 08/14/14  0550 History   None    No chief complaint on file.    (Consider location/radiation/quality/duration/timing/severity/associated sxs/prior Treatment) Patient is a 81 y.o. female presenting with shortness of breath. The history is provided by the patient and the spouse. The history is limited by the condition of the patient.  Shortness of Breath Severity:  Severe Onset quality:  Sudden Duration:  5 hours Timing:  Constant Progression:  Unchanged Chronicity:  New Context: not URI   Relieved by:  Nothing Worsened by:  Nothing tried Ineffective treatments:  None tried Associated symptoms: wheezing   Associated symptoms: no chest pain, no diaphoresis and no sputum production   Risk factors: no tobacco use   Patient with a h/o PAF cardioverted on 3/16 and was taken off lasix due to sulfa allergy presents with sudden onset SOB at rest.  Was doing well in cardiac rehabs then awoke this am with SOB  Past Medical History  Diagnosis Date  . CHF (congestive heart failure)     2D ECHO, 06/21/2011 - EF 55-60%  . Hypothyroidism   . Dyslipidemia   . Hypertension   . PAF (paroxysmal atrial fibrillation)   .  Acute respiratiory failure requiring BiPap 06/24/2011  . Aortic valvular stenosis, moderate to severe 06/24/2011  . Atrial fibrillation   . Allergy   . Heart murmur   . Coronary artery disease   . Exertional shortness of breath   . Arthritis     "back" (10/30/2012)   Past Surgical History  Procedure Laterality Date  . Cardioversion  06/19/2011    Procedure: CARDIOVERSION;  Surgeon: Sanda Klein, MD;  Location: [facility];  Service: Cardiovascular;  Laterality: N/A;  . Appendectomy    . Angioplasty    . Cataract extraction w/ intraocular lens  implant, bilateral    . Lumbar disc surgery      "had to go in twice and clean out scar tissue" (10/30/2012)  . Coronary angioplasty with stent placement      "1" (10/30/2012)  . Dilation and curettage  of uterus    . Excisional hemorrhoidectomy    . Wrist fracture surgery Left   . Shoulder acromioplasty Left     "fell; put a new ball in" (10/30/2012)  . Cardioversion  07/11/2010    Successful coversion to sinus rhythm  . Cardiac catheterization  10/06/2009    Continue medical therapy  . Cardiac catheterization  04/07/2009    RCA stented with a 3x18mm stent resulting in a reduction of 90% narrowing to normal  . Cardioversion N/A 08/10/2014    Procedure: CARDIOVERSION;  Surgeon: Sanda Klein, MD;  Location: [facility];  Service: Cardiovascular;  Laterality: N/A;   Family History  Problem Relation Age of Onset  . Heart disease Brother   . Heart disease Brother   . Lung disease Brother   . Heart disease Mother 88  . Cancer Mother 61    Uterine  . Stroke Mother 58  . Heart disease Father   . Hypertension Child 34  . Alcohol abuse Child    History  Substance Use Topics  . Smoking status: Never Smoker   . Smokeless tobacco: Never Used  . Alcohol Use: No   OB History    No data available     Review of Systems  Constitutional: Negative for diaphoresis.  Respiratory: Positive for shortness of breath and wheezing. Negative for sputum production.   Cardiovascular: Negative for chest pain  and palpitations.  All other systems reviewed and are negative.     Allergies  Furosemide and Sulfa antibiotics  Home Medications   Prior to Admission medications   Medication Sig Start Date End Date Taking? Authorizing Provider  acetaminophen (TYLENOL) 650 MG CR tablet Take 1,950 mg by mouth daily.     Historical Provider, MD  amiodarone (PACERONE) 200 MG tablet Take 2 tablets (400 mg total) by mouth daily. 08/10/14   Mihai Croitoru, MD  aspirin EC 81 MG tablet Take 81 mg by mouth daily.    Historical Provider, MD  atorvastatin (LIPITOR) 40 MG tablet Take 1 tablet (40 mg total) by mouth daily. 05/25/14   Mihai Croitoru, MD  ezetimibe (ZETIA) 10 MG tablet Take 1 tablet (10 mg total) by  mouth daily. 12/24/13   Mihai Croitoru, MD  folic acid (FOLVITE) 1 MG tablet Take 2 tablets (2 mg total) by mouth daily. 12/24/13   Mihai Croitoru, MD  levothyroxine (SYNTHROID, LEVOTHROID) 88 MCG tablet Take 1 tablet (88 mcg total) by mouth daily. 04/25/14   Mihai Croitoru, MD  loratadine (CLARITIN) 10 MG tablet Take 10 mg by mouth daily.    Historical Provider, MD  metoprolol tartrate (LOPRESSOR) 25 MG tablet Take 0.5 tablets (12.5 mg total) by mouth 2 (two) times daily. 02/16/14   Mihai Croitoru, MD  Multiple Vitamins-Minerals (CENTRUM SILVER) tablet Take 1 tablet by mouth daily.      Historical Provider, MD  potassium chloride SA (K-DUR,KLOR-CON) 20 MEQ tablet TAKE ONE-HALF (1/2) TABLET DAILY 06/03/14   Mihai Croitoru, MD  triamcinolone cream (KENALOG) 0.1 % Apply 1 application topically 2 (two) times daily.  02/02/14   Historical Provider, MD  warfarin (COUMADIN) 5 MG tablet Take 1 tablet by mouth daily or as directed Patient taking differently: Take 1.25-2.5 mg by mouth daily. 2.5mg  on Monday and Friday and 1.25mg  all other days 12/24/13   Mihai Croitoru, MD   BP 193/80 mmHg  Pulse 73  Temp(Src) 98.4 F (36.9 C) (Oral)  Resp 16  SpO2 97% Physical Exam  Constitutional: She is oriented to person, place, and time. She appears well-developed and well-nourished.  HENT:  Head: Normocephalic and atraumatic.  Mouth/Throat: Oropharynx is clear and moist.  Eyes: Conjunctivae are normal. Pupils are equal, round, and reactive to light.  Neck: Normal range of motion. Neck supple.  Cardiovascular: Normal rate, regular rhythm and intact distal pulses.   Pulmonary/Chest: No respiratory distress. She has decreased breath sounds. She has wheezes. She has no rales.  Abdominal: Soft. Bowel sounds are normal. There is no tenderness. There is no rebound and no guarding.  Musculoskeletal: Normal range of motion.  Trace edema  Neurological: She is alert and oriented to person, place, and time.  Skin: Skin is  warm and dry. She is not diaphoretic.  Psychiatric: She has a normal mood and affect.    ED Course  Procedures (including critical care time) Labs Review Labs Reviewed  CBC WITH DIFFERENTIAL/PLATELET  BASIC METABOLIC PANEL  BRAIN NATRIURETIC PEPTIDE  TROPONIN I  PROTIME-INR    Imaging Review No results found.   EKG Interpretation   Date/Time:  Sunday August 14 2014 06:05:30 EDT Ventricular Rate:  82 PR Interval:  256 QRS Duration: 162 QT Interval:  460 QTC Calculation: 537 R Axis:   -63 Text Interpretation:  Sinus rhythm with 1st degree A-V block with  Premature supraventricular complexes and Fusion complexes Left axis  deviation Left bundle branch block Confirmed by [facility]  MD, Deysy Schabel  (  [id number]) on 08/14/2014 6:09:42 AM      MDM   Final diagnoses:  None   Patient is allergic to sulfa and lasix and therefore can only receive zaroxolyn and ethacrynic acid as a diuretic.  Will need admission to [facility] to receive there medications and improve heart failure.      EKG Interpretation  Date/Time:  Sunday August 14 2014 06:23:11 EDT Ventricular Rate:  77 PR Interval:  278 QRS Duration: 92 QT Interval:  434 QTC Calculation: 491 R Axis:   13 Text Interpretation:  Sinus rhythm with 1st degree A-V block Nonspecific ST abnormality Prolonged QT Confirmed by [facility]  MD, Emmaline Kluver ([id number]) on 08/14/2014 6:24:53 AM      Case d/w Dr. Tommi Rumps of cardiology via phone.  No indication to call STEMI in intermittent LBBB.   Patient will be accepted to step down under Dr. Stanford Breed.  No IV anticoagulation unless positive troponin   Hoa Deriso, MD 08/14/14 [phone]

## 2014-08-14 NOTE — H&P (Signed)
Patient ID: [name] MRN: [id number], DOB/AGE: [birth year]   Admit date: 08/14/2014   Primary Physician: Wyatt Haste, MD Primary Cardiologist: Jerilynn Mages. Croitoru, MD   Pt. Profile:  81 y/o female with a h/o CAD, diast CHF, rash felt to be 2/2 diuretics, and PAF s/p successful DCCV on 08/09/2014, who presents on tx from [facility] after developing dyspnea, orthopnea, and wheezing.  Problem List  Past Medical History  Diagnosis Date  . Chronic diastolic CHF (congestive heart failure)     a. 10/2012 Echo: EF 55-60%, no rwma, Gr 2 DD, moderate AS, mod dil LA, mildly to mod dil RA.  Marland Kitchen Hypothyroidism   . Dyslipidemia   . Hypertension   . PAF (paroxysmal atrial fibrillation)     a. CHA2DS2VASc = 7-->chronic coumadin;  b. s/p DCCV 05/2011, 05/2012, 07/2014;  c. On amio.  Marland Kitchen  Acute respiratiory failure requiring BiPap 06/24/2011  . Aortic valvular stenosis, moderate to severe 06/24/2011    a. 10/2012 Echo: mod AS, Valve 1.25 cm^2 (VTI), 1.19cm^2 (Vmax).  . Allergy   . Coronary artery disease     a. 03/2009 PCI RCA (3.0x18 BMS).  . Exertional shortness of breath   . Arthritis     "back" (10/30/2012)  . Rash     a. felt to be 2/2 lasix/torsemide in setting of sulfa allergy (lasix d/c ~ 06/2014, torsemide d/c 08/09/2014).    Past Surgical History  Procedure Laterality Date  . Cardioversion  06/19/2011    Procedure: CARDIOVERSION;  Surgeon: Sanda Klein, MD;  Location: [place];  Service: Cardiovascular;  Laterality: N/A;  . Appendectomy    . Angioplasty    . Cataract extraction w/ intraocular lens  implant, bilateral    . Lumbar disc surgery      "had to go in twice and clean out scar tissue" (10/30/2012)  . Coronary angioplasty with stent placement      "1" (10/30/2012)  . Dilation and curettage of uterus    . Excisional hemorrhoidectomy    . Wrist fracture surgery Left   . Shoulder acromioplasty Left     "fell; put a new ball in" (10/30/2012)  . Cardioversion  07/11/2010    Successful  coversion to sinus rhythm  . Cardiac catheterization  10/06/2009    Continue medical therapy  . Cardiac catheterization  04/07/2009    RCA stented with a 3x31mm stent resulting in a reduction of 90% narrowing to normal  . Cardioversion N/A 08/10/2014    Procedure: CARDIOVERSION;  Surgeon: Sanda Klein, MD;  Location: [facility];  Service: Cardiovascular;  Laterality: N/A;    Allergies  Allergies  Allergen Reactions  . Furosemide Rash  . Sulfa Antibiotics Itching and Rash    HPI  81 y/o female with the above complex problem list.  She has a h/o CAD and is s/p BMS to the RCA in 2010.  She also has a h/o chronic diast chf, PAF s/p multiple cardioversions, and moderate Ao stenosis.  About 4 months ago, she developed a truncal rash.  She was seen by dermatology and advised that this was more than likely a drug rash.  Given her h/o sulfa allergy and long term lasix usage, decision was made to d/c lasix about a month ago.  She was treated with a steroid taper with improvement in rash and was then placed on torsemide QOD.  The rash returned.  She has also been having issues with PAF.  She is on long term amio therapy.  She  had recurrent afib in January and was initially set up for dccv but then converted spontaneously @ home.  Last week, she developed recurrent afib and was seen urgently on 3/15.  She was then set up for DCCV, which took place on 3/16 and was successful.  In an effort to help her maintain sinus, her amio was increased from 200 to 400 mg daily.  Due to ongoing rash, she says that she was advised to d/c her torsemide when she presented for DCCV last Wednesday 3/16.  She has been weighing herself daily and her wt has been stable @ 142 lbs.  She is very active @ home and has been doing well post-cardioversion.  Last night however, she awoke suddenly with dyspnea and orthopnea associated with wheezing.  She also noted lower ext edema.  Her husband took her to the [facility] where she was in  sinus rhythm with 1st deg avb and intermittent LBBB.  Troponin was nl.  BNP was mildly elevated @ 208.2.  CXR showed mild CHF.  She was given a nebulizer treatment with some improvement in dyspnea.  Decision was made to tx to [facility] for cardiology eval given diuretic allergies.  She is currently resting comfortably w/o complaints.  Home Medications  Prior to Admission medications   Medication Sig Start Date End Date Taking? Authorizing Provider  acetaminophen (TYLENOL) 650 MG CR tablet Take 1,950 mg by mouth daily.     Historical Provider, MD  amiodarone (PACERONE) 200 MG tablet Take 2 tablets (400 mg total) by mouth daily. 08/10/14   Mihai Croitoru, MD  aspirin EC 81 MG tablet Take 81 mg by mouth daily.    Historical Provider, MD  atorvastatin (LIPITOR) 40 MG tablet Take 1 tablet (40 mg total) by mouth daily. 05/25/14   Mihai Croitoru, MD  ezetimibe (ZETIA) 10 MG tablet Take 1 tablet (10 mg total) by mouth daily. 12/24/13   Mihai Croitoru, MD  folic acid (FOLVITE) 1 MG tablet Take 2 tablets (2 mg total) by mouth daily. 12/24/13   Mihai Croitoru, MD  levothyroxine (SYNTHROID, LEVOTHROID) 88 MCG tablet Take 1 tablet (88 mcg total) by mouth daily. 04/25/14   Mihai Croitoru, MD  loratadine (CLARITIN) 10 MG tablet Take 10 mg by mouth daily.    Historical Provider, MD  metoprolol tartrate (LOPRESSOR) 25 MG tablet Take 0.5 tablets (12.5 mg total) by mouth 2 (two) times daily. 02/16/14   Mihai Croitoru, MD  Multiple Vitamins-Minerals (CENTRUM SILVER) tablet Take 1 tablet by mouth daily.      Historical Provider, MD  potassium chloride SA (K-DUR,KLOR-CON) 20 MEQ tablet TAKE ONE-HALF (1/2) TABLET DAILY 06/03/14   Mihai Croitoru, MD  triamcinolone cream (KENALOG) 0.1 % Apply 1 application topically 2 (two) times daily.  02/02/14   Historical Provider, MD  warfarin (COUMADIN) 5 MG tablet Take 1 tablet by mouth daily or as directed Patient taking differently: Take 1.25-2.5 mg by mouth daily. 2.5mg  on Monday and Friday  and 1.25mg  all other days 12/24/13   Sanda Klein, MD    Family History  Family History  Problem Relation Age of Onset  . Heart disease Brother   . Heart disease Brother   . Lung disease Brother   . Heart disease Mother 31  . Cancer Mother 62    Uterine  . Stroke Mother 46  . Heart disease Father   . Hypertension Child 34  . Alcohol abuse Child     Social History  History   Social  History  . Marital Status: Married    Spouse Name: N/A  . Number of Children: 1  . Years of Education: N/A   Occupational History  . Retired [employer]   Social History Main Topics  . Smoking status: Never Smoker   . Smokeless tobacco: Never Used  . Alcohol Use: No  . Drug Use: No  . Sexual Activity: No   Other Topics Concern  . Not on file   Social History Narrative     Review of Systems General:  No chills, fever, night sweats or weight changes.  Cardiovascular:  No chest pain, +++ dyspnea/orthopnea overnight, mild LE edema, no palpitations, paroxysmal nocturnal dyspnea. Dermatological: No rash, lesions/masses Respiratory: No cough, +++ dyspnea Urologic: No hematuria, dysuria Abdominal:   No nausea, vomiting, diarrhea, bright red blood per rectum, melena, or hematemesis Neurologic:  No visual changes, wkns, changes in mental status. All other systems reviewed and are otherwise negative except as noted above.  Physical Exam  Blood pressure 111/36, pulse 61, temperature 98 F (36.7 C), temperature source Oral, resp. rate 23, SpO2 94 %.  General: Pleasant, NAD Psych: Normal affect. Neuro: Alert and oriented X 3. Moves all extremities spontaneously. HEENT: Normal  Neck: Supple.  JVP ~ 12 cm.  R bruit vs radiated murmur. Lungs:  Resp regular and unlabored, Diminished breath sounds in left base, otw cta. Heart: RRR no s3, s4, 3/6 SEM throughout - radiates to right neck. Abdomen: Soft, non-tender, non-distended, BS + x 4.  Extremities: No clubbing, cyanosis or edema.  DP/PT/Radials 2+ and equal bilaterally. Skin: Rash over back/upper chest/bilat arms. Labs   Recent Labs  08/14/14 0610  TROPONINI <0.03   Lab Results  Component Value Date   WBC 11.4* 08/14/2014   HGB 13.5 08/14/2014   HCT 42.6 08/14/2014   MCV 99.3 08/14/2014   PLT 210 08/14/2014     Recent Labs Lab 08/09/14 1623 08/14/14 0610  NA 142 142  K 4.8 3.6  CL 105 107  CO2 29 26  BUN 23 18  CREATININE 1.16* 1.06  CALCIUM 8.8 9.2  PROT 6.6  --   BILITOT 0.8  --   ALKPHOS 66  --   ALT 21  --   AST 26  --   GLUCOSE 93 123*   Lab Results  Component Value Date   CHOL 139 01/26/2014   HDL 45 01/26/2014   LDLCALC 74 01/26/2014   TRIG 98 01/26/2014    Radiology/Studies  Dg Chest Portable 1 View  08/14/2014   CLINICAL DATA:  Dyspnea for 1 hour.  Cardioversion 3 days ago  EXAM: PORTABLE CHEST - 1 VIEW  COMPARISON:  01/02/2013  FINDINGS: There is mild unchanged cardiomegaly. There is no airspace consolidation. There is no large effusion. There is apical pleural thickening which is unchanged. There is mild vascular and interstitial prominence which is worsened.  IMPRESSION: Mild congestive heart failure.   Electronically Signed   By: Andreas Newport M.D.   On: 08/14/2014 06:43   ECG  RSR, 1st deg, 77, nonspecific ST changes. RSR, 1st deg, 82, lbbb (reportedly obtained during breathing treatment).  ASSESSMENT AND PLAN  1.  Acute on chronic diastolic CHF: pt presented this AM with orthopnea and wheezing.  CXR showed mild chf.  BNP only mildly elevated. She has been off of torsemide since last Wednesday in the setting of a rash that first developed ~ 4 mos ago.  Though her wt has been stable since her DCCV on 3/16,  she likely has a narrow euvolemic window in the setting of mod Ao Stenosis. I will start edecrin 50 mg po daily - first dose now.  HR stable in sinus.  BP currently stable.  Cont bb.  2.  HTN:  Stable.  3.  PAF:  In sinus.  Cont bb/amio/coumadin.  4.   Intermittent LBBB:  I looked back @ her old 10 leads dating back several years and do not see any prior h/o lbbb.  She denies chest pain.  Cont to cycle troponin.  Cont bb.  5.  CAD:  No chest pain.  Intermittent lbbb noted @ [facility].  No acute st/t changes otherwise.  Cont asa, statin, zetia, bb.  Cont to cycle CE.    6.  HL:  Cont statin/zetia.  7.  Rash:  Drug rash - presumed to be 2/2 sulfa/diuretics.  Now off torsemide since 3/16.  Follow rash as outpt - sees dermatology.  Signed, Murray Hodgkins, NP 08/14/2014, 11:43 AM

## 2014-08-14 NOTE — Progress Notes (Signed)
ANTICOAGULATION CONSULT NOTE - Initial Consult  Pharmacy Consult for Coumadin Indication: Afib  Allergies  Allergen Reactions  . Furosemide Rash  . Sulfa Antibiotics Itching and Rash    Vital Signs: Temp: 98.1 F (36.7 C) (03/20 1148) Temp Source: Oral (03/20 1148) BP: 147/63 mmHg (03/20 1145) Pulse Rate: 65 (03/20 1148)  Labs:  Recent Labs  08/14/14 0610  HGB 13.5  HCT 42.6  PLT 210  LABPROT 15.7*  INR 1.25  CREATININE 1.06  TROPONINI <0.03    Estimated Creatinine Clearance: 33.4 mL/min (by C-G formula based on Cr of 1.06).   Medical History: Past Medical History  Diagnosis Date  . Chronic diastolic CHF (congestive heart failure)     a. 10/2012 Echo: EF 55-60%, no rwma, Gr 2 DD, moderate AS, mod dil LA, mildly to mod dil RA.  Marland Kitchen Hypothyroidism   . Dyslipidemia   . Hypertension   . PAF (paroxysmal atrial fibrillation)     a. CHA2DS2VASc = 7-->chronic coumadin;  b. s/p DCCV 05/2011, 05/2012, 07/2014;  c. On amio.  Marland Kitchen  Acute respiratiory failure requiring BiPap 06/24/2011  . Aortic valvular stenosis, moderate to severe 06/24/2011    a. 10/2012 Echo: mod AS, Valve 1.25 cm^2 (VTI), 1.19cm^2 (Vmax).  . Allergy   . Coronary artery disease     a. 03/2009 PCI RCA (3.0x18 BMS).  . Exertional shortness of breath   . Arthritis     "back" (10/30/2012)  . Rash     a. felt to be 2/2 lasix/torsemide in setting of sulfa allergy (lasix d/c ~ 06/2014, torsemide d/c 08/09/2014).    Assessment: 81 yo F presents on 3/20 with dyspnea, orthopnea, and wheezing. PMH includes CAD, diastolic CHF, PAF s/p cardioversion. Pharmacy to continue coumadin for Afib. INR was 4.4 on 3/5 AC visit and told to hold a couple doses and continue 2.5mg  daily. However pt reports taking 1.25mg  daily exc 2.5mg  Mon/Fri. INR on admission was subtherapeutic at 1.25. Last dose of coumadin was 3/19 per patient. CBC stable, no s/s of bleed  PTA Coumadin: Patient reports taking 1.25mg  daily exc 2.5mg  Mon/Fri AC note on  3/15 reports 2.5mg  daily.  Goal of Therapy:  INR 2-3 Monitor platelets by anticoagulation protocol: Yes   Plan:  Will give coumadin 2.5mg  x 1  Monitor daily INR, CBC, s/s of bleed  Jarl Sellitto J 08/14/2014,12:34 PM

## 2014-08-15 DIAGNOSIS — I4439 Other atrioventricular block: Secondary | ICD-10-CM

## 2014-08-15 LAB — COMPREHENSIVE METABOLIC PANEL
ALBUMIN: 2.9 g/dL — AB (ref 3.5–5.2)
ALT: 16 U/L (ref 0–35)
ANION GAP: 7 (ref 5–15)
AST: 21 U/L (ref 0–37)
Alkaline Phosphatase: 65 U/L (ref 39–117)
BUN: 17 mg/dL (ref 6–23)
CO2: 27 mmol/L (ref 19–32)
Calcium: 8.5 mg/dL (ref 8.4–10.5)
Chloride: 109 mmol/L (ref 96–112)
Creatinine, Ser: 1.12 mg/dL — ABNORMAL HIGH (ref 0.50–1.10)
GFR calc non Af Amer: 43 mL/min — ABNORMAL LOW (ref >90)
GFR, EST AFRICAN AMERICAN: 49 mL/min — AB (ref >90)
Glucose, Bld: 98 mg/dL (ref 70–99)
POTASSIUM: 4.2 mmol/L (ref 3.5–5.1)
SODIUM: 143 mmol/L (ref 135–145)
TOTAL PROTEIN: 6 g/dL (ref 6.0–8.3)
Total Bilirubin: 1 mg/dL (ref 0.3–1.2)

## 2014-08-15 LAB — PROTIME-INR
INR: 1.35 (ref 0.00–1.49)
PROTHROMBIN TIME: 16.8 s — AB (ref 11.6–15.2)

## 2014-08-15 LAB — CBC
HCT: 33.4 % — ABNORMAL LOW (ref 36.0–46.0)
HEMOGLOBIN: 10.7 g/dL — AB (ref 12.0–15.0)
MCH: 31.5 pg (ref 26.0–34.0)
MCHC: 32 g/dL (ref 30.0–36.0)
MCV: 98.2 fL (ref 78.0–100.0)
Platelets: 184 10*3/uL (ref 150–400)
RBC: 3.4 MIL/uL — AB (ref 3.87–5.11)
RDW: 14 % (ref 11.5–15.5)
WBC: 8.5 10*3/uL (ref 4.0–10.5)

## 2014-08-15 MED ORDER — WARFARIN SODIUM 3 MG PO TABS
3.0000 mg | ORAL_TABLET | Freq: Once | ORAL | Status: AC
  Administered 2014-08-15: 3 mg via ORAL
  Filled 2014-08-15 (×2): qty 1

## 2014-08-15 MED ORDER — METOPROLOL TARTRATE 12.5 MG HALF TABLET
12.5000 mg | ORAL_TABLET | Freq: Once | ORAL | Status: AC
Start: 2014-08-15 — End: 2014-08-15
  Administered 2014-08-15: 12.5 mg via ORAL

## 2014-08-15 MED ORDER — METOPROLOL TARTRATE 25 MG PO TABS
25.0000 mg | ORAL_TABLET | Freq: Two times a day (BID) | ORAL | Status: DC
  Administered 2014-08-15 – 2014-08-19 (×7): 25 mg via ORAL
  Filled 2014-08-15 (×9): qty 1

## 2014-08-15 MED ORDER — ETHACRYNIC ACID 25 MG PO TABS
50.0000 mg | ORAL_TABLET | Freq: Two times a day (BID) | ORAL | Status: DC
  Administered 2014-08-15 – 2014-08-19 (×8): 50 mg via ORAL
  Filled 2014-08-15 (×15): qty 2

## 2014-08-15 MED ORDER — DIPHENHYDRAMINE HCL 25 MG PO CAPS
25.0000 mg | ORAL_CAPSULE | Freq: Every evening | ORAL | Status: DC | PRN
Start: 2014-08-15 — End: 2014-08-19
  Administered 2014-08-15 – 2014-08-18 (×4): 25 mg via ORAL
  Filled 2014-08-15 (×4): qty 1

## 2014-08-15 NOTE — Progress Notes (Signed)
1630 transferred in 2h via wheelchair . Respiration easy and regular . No apparent distress . Safety precaution observed

## 2014-08-15 NOTE — Progress Notes (Signed)
[place] for Coumadin Indication: Afib  Allergies  Allergen Reactions  . Furosemide Rash  . Sulfa Antibiotics Itching and Rash    Vital Signs: Temp: 98 F (36.7 C) (03/21 0808) Temp Source: Oral (03/21 0808) BP: 111/39 mmHg (03/21 0808) Pulse Rate: 68 (03/21 0808)  Labs:  Recent Labs  08/14/14 0610 08/15/14 0251  HGB 13.5 10.7*  HCT 42.6 33.4*  PLT 210 184  LABPROT 15.7* 16.8*  INR 1.25 1.35  CREATININE 1.06 1.12*  TROPONINI <0.03  --     Estimated Creatinine Clearance: 31.5 mL/min (by C-G formula based on Cr of 1.12).   Medical History: Past Medical History  Diagnosis Date  . Chronic diastolic CHF (congestive heart failure)     a. 10/2012 Echo: EF 55-60%, no rwma, Gr 2 DD, moderate AS, mod dil LA, mildly to mod dil RA.  Marland Kitchen Hypothyroidism   . Dyslipidemia   . Hypertension   . PAF (paroxysmal atrial fibrillation)     a. CHA2DS2VASc = 7-->chronic coumadin;  b. s/p DCCV 05/2011, 05/2012, 07/2014;  c. On amio.  Marland Kitchen  Acute respiratiory failure requiring BiPap 06/24/2011  . Aortic valvular stenosis, moderate to severe 06/24/2011    a. 10/2012 Echo: mod AS, Valve 1.25 cm^2 (VTI), 1.19cm^2 (Vmax).  . Allergy   . Coronary artery disease     a. 03/2009 PCI RCA (3.0x18 BMS).  . Exertional shortness of breath   . Arthritis     "back" (10/30/2012)  . Rash     a. felt to be 2/2 lasix/torsemide in setting of sulfa allergy (lasix d/c ~ 06/2014, torsemide d/c 08/09/2014).    Assessment: 81 yo F presents on 3/20 with dyspnea, orthopnea, and wheezing. PMH includes CAD, diastolic CHF, PAF s/p cardioversion. Pharmacy to continue coumadin for Afib. INR was 4.4 on 3/5 AC visit and told to hold a couple doses and continue 2.5mg  daily. However pt reports taking 1.25mg  daily exc 2.5mg  Mon/Fri. INR on admission was subtherapeutic at 1.25. Last dose of coumadin was 3/19 per patient.   INR slight trend up this am at 1.3. No issues overnight.  PTA  Coumadin: Patient reports taking 1.25mg  daily exc 2.5mg  Mon/Fri AC note on 3/15 reports 2.5mg  daily.  Goal of Therapy:  INR 2-3 Monitor platelets by anticoagulation protocol: Yes   Plan:  Will give coumadin 3mg  x 1  Monitor daily INR, CBC, s/s of bleed  Erin Hearing PharmD., BCPS Clinical Pharmacist Pager [phone] 08/15/2014 11:25 AM

## 2014-08-15 NOTE — Progress Notes (Signed)
1600 report received from [facility]

## 2014-08-15 NOTE — Progress Notes (Signed)
DAILY PROGRESS NOTE  Subjective:  Doesn't feel that her breathing is any better. Going in and out of rate-dependent LBBB - rhythm appears sinus. Diuresed about -320 cc negative.  Objective:  Temp:  [97.9 F (36.6 C)-98.5 F (36.9 C)] 98 F (36.7 C) (03/21 0808) Pulse Rate:  [56-68] 68 (03/21 0808) Resp:  [18-30] 30 (03/21 0808) BP: (102-140)/(34-56) 111/39 mmHg (03/21 0808) SpO2:  [97 %-100 %] 100 % (03/21 0808) Weight:  [143 lb 4.8 oz (65 kg)] 143 lb 4.8 oz (65 kg) (03/21 0300) Weight change:   Intake/Output from previous day: 03/20 0701 - 03/21 0700 In: 480 [P.O.:480] Out: 800 [Urine:800]  Intake/Output from this shift: Total I/O In: 360 [P.O.:360] Out: -   Medications: Current Facility-Administered Medications  Medication Dose Route Frequency Provider Last Rate Last Dose  . 0.9 %  sodium chloride infusion  250 mL Intravenous PRN Rogelia Mire, NP      . acetaminophen (TYLENOL) tablet 650 mg  650 mg Oral Q4H PRN Rogelia Mire, NP      . amiodarone (PACERONE) tablet 400 mg  400 mg Oral Daily Rogelia Mire, NP   400 mg at 08/15/14 0943  . aspirin EC tablet 81 mg  81 mg Oral Daily Rogelia Mire, NP   81 mg at 08/15/14 0942  . atorvastatin (LIPITOR) tablet 40 mg  40 mg Oral q1800 Rogelia Mire, NP   40 mg at 08/14/14 1707  . ethacrynic acid ([name]) tablet 50 mg  50 mg Oral Daily Rogelia Mire, NP   50 mg at 08/15/14 0942  . ezetimibe (ZETIA) tablet 10 mg  10 mg Oral Daily Rogelia Mire, NP   10 mg at 08/15/14 0942  . folic acid (FOLVITE) tablet 2 mg  2 mg Oral Daily Rogelia Mire, NP   2 mg at 08/15/14 0942  . levothyroxine (SYNTHROID, LEVOTHROID) tablet 88 mcg  88 mcg Oral QAC breakfast Rogelia Mire, NP   88 mcg at 08/15/14 0800  . loratadine (CLARITIN) tablet 10 mg  10 mg Oral Daily Rogelia Mire, NP   10 mg at 08/15/14 0943  . metoprolol tartrate (LOPRESSOR) tablet 12.5 mg  12.5 mg Oral BID Rogelia Mire, NP   12.5 mg at 08/15/14 0943  . multivitamin with minerals tablet 1 tablet  1 tablet Oral Daily Sueanne Margarita, MD   1 tablet at 08/15/14 [phone]  . ondansetron (ZOFRAN) injection 4 mg  4 mg Intravenous Q6H PRN Rogelia Mire, NP      . potassium chloride (K-DUR,KLOR-CON) CR tablet 10 mEq  10 mEq Oral Daily Rogelia Mire, NP   10 mEq at 08/15/14 0943  . sodium chloride 0.9 % injection 3 mL  3 mL Intravenous Q12H Rogelia Mire, NP   3 mL at 08/15/14 0945  . sodium chloride 0.9 % injection 3 mL  3 mL Intravenous PRN Rogelia Mire, NP      . triamcinolone cream (KENALOG) 0.1 % 1 application  1 application Topical BID Rogelia Mire, NP   1 application at 03/49/17 [phone]  . warfarin (COUMADIN) tablet 3 mg  3 mg Oral ONCE-1800 Lyndee Leo, [facility]      . Warfarin - Pharmacist Dosing Inpatient   Does not apply [place], RPH   0  at 08/14/14 1800    Physical Exam: General appearance: alert and no distress Neck: no carotid bruit and no JVD  Lungs: clear to auscultation bilaterally Heart: regular rate and rhythm, S1, S2 normal and systolic murmur: midsystolic 3/6, crescendo at 2nd right intercostal space Abdomen: soft, non-tender; bowel sounds normal; no masses,  no organomegaly Extremities: edema trace pedal Pulses: 2+ and symmetric Skin: diffuse maculopapular rash, mostly over the trunk and back Neurologic: Grossly normal  Lab Results: Results for orders placed or performed during the hospital encounter of 08/14/14 (from the past 48 hour(s))  CBC with Differential/Platelet     Status: Abnormal   Collection Time: 08/14/14  6:10 AM  Result Value Ref Range   WBC 11.4 (H) 4.0 - 10.5 K/uL   RBC 4.29 3.87 - 5.11 MIL/uL   Hemoglobin 13.5 12.0 - 15.0 g/dL   HCT 42.6 36.0 - 46.0 %   MCV 99.3 78.0 - 100.0 fL   MCH 31.5 26.0 - 34.0 pg   MCHC 31.7 30.0 - 36.0 g/dL   RDW 13.8 11.5 - 15.5 %   Platelets 210 150 - 400 K/uL   Neutrophils Relative % 69 43 - 77  %   Neutro Abs 7.8 (H) 1.7 - 7.7 K/uL   Lymphocytes Relative 20 12 - 46 %   Lymphs Abs 2.3 0.7 - 4.0 K/uL   Monocytes Relative 10 3 - 12 %   Monocytes Absolute 1.1 (H) 0.1 - 1.0 K/uL   Eosinophils Relative 1 0 - 5 %   Eosinophils Absolute 0.2 0.0 - 0.7 K/uL   Basophils Relative 0 0 - 1 %   Basophils Absolute 0.0 0.0 - 0.1 K/uL  Basic metabolic panel     Status: Abnormal   Collection Time: 08/14/14  6:10 AM  Result Value Ref Range   Sodium 142 135 - 145 mmol/L   Potassium 3.6 3.5 - 5.1 mmol/L   Chloride 107 96 - 112 mmol/L   CO2 26 19 - 32 mmol/L   Glucose, Bld 123 (H) 70 - 99 mg/dL   BUN 18 6 - 23 mg/dL   Creatinine, Ser 1.06 0.50 - 1.10 mg/dL   Calcium 9.2 8.4 - 10.5 mg/dL   GFR calc non Af Amer 45 (L) >90 mL/min   GFR calc Af Amer 53 (L) >90 mL/min    Comment: (NOTE) The eGFR has been calculated using the CKD EPI equation. This calculation has not been validated in all clinical situations. eGFR's persistently <90 mL/min signify possible Chronic Kidney Disease.    Anion gap 9 5 - 15  Brain natriuretic peptide     Status: Abnormal   Collection Time: 08/14/14  6:10 AM  Result Value Ref Range   B Natriuretic Peptide 208.2 (H) 0.0 - 100.0 pg/mL  Troponin I     Status: None   Collection Time: 08/14/14  6:10 AM  Result Value Ref Range   Troponin I <0.03 <0.031 ng/mL    Comment:        NO INDICATION OF MYOCARDIAL INJURY.   Protime-INR     Status: Abnormal   Collection Time: 08/14/14  6:10 AM  Result Value Ref Range   Prothrombin Time 15.7 (H) 11.6 - 15.2 seconds   INR 1.25 0.00 - 1.49  MRSA PCR Screening     Status: None   Collection Time: 08/14/14 10:43 AM  Result Value Ref Range   MRSA by PCR NEGATIVE NEGATIVE    Comment:        The GeneXpert MRSA Assay (FDA approved for NASAL specimens only), is one component of a comprehensive MRSA colonization surveillance program. It is  not intended to diagnose MRSA infection nor to guide or monitor treatment for MRSA  infections.   Comprehensive metabolic panel     Status: Abnormal   Collection Time: 08/15/14  2:51 AM  Result Value Ref Range   Sodium 143 135 - 145 mmol/L   Potassium 4.2 3.5 - 5.1 mmol/L   Chloride 109 96 - 112 mmol/L   CO2 27 19 - 32 mmol/L   Glucose, Bld 98 70 - 99 mg/dL   BUN 17 6 - 23 mg/dL   Creatinine, Ser 1.12 (H) 0.50 - 1.10 mg/dL   Calcium 8.5 8.4 - 10.5 mg/dL   Total Protein 6.0 6.0 - 8.3 g/dL   Albumin 2.9 (L) 3.5 - 5.2 g/dL   AST 21 0 - 37 U/L   ALT 16 0 - 35 U/L   Alkaline Phosphatase 65 39 - 117 U/L   Total Bilirubin 1.0 0.3 - 1.2 mg/dL   GFR calc non Af Amer 43 (L) >90 mL/min   GFR calc Af Amer 49 (L) >90 mL/min    Comment: (NOTE) The eGFR has been calculated using the CKD EPI equation. This calculation has not been validated in all clinical situations. eGFR's persistently <90 mL/min signify possible Chronic Kidney Disease.    Anion gap 7 5 - 15  Protime-INR     Status: Abnormal   Collection Time: 08/15/14  2:51 AM  Result Value Ref Range   Prothrombin Time 16.8 (H) 11.6 - 15.2 seconds   INR 1.35 0.00 - 1.49  CBC     Status: Abnormal   Collection Time: 08/15/14  2:51 AM  Result Value Ref Range   WBC 8.5 4.0 - 10.5 K/uL   RBC 3.40 (L) 3.87 - 5.11 MIL/uL   Hemoglobin 10.7 (L) 12.0 - 15.0 g/dL   HCT 33.4 (L) 36.0 - 46.0 %   MCV 98.2 78.0 - 100.0 fL   MCH 31.5 26.0 - 34.0 pg   MCHC 32.0 30.0 - 36.0 g/dL   RDW 14.0 11.5 - 15.5 %   Platelets 184 150 - 400 K/uL    Imaging: Dg Chest Portable 1 View  08/14/2014   CLINICAL DATA:  Dyspnea for 1 hour.  Cardioversion 3 days ago  EXAM: PORTABLE CHEST - 1 VIEW  COMPARISON:  01/02/2013  FINDINGS: There is mild unchanged cardiomegaly. There is no airspace consolidation. There is no large effusion. There is apical pleural thickening which is unchanged. There is mild vascular and interstitial prominence which is worsened.  IMPRESSION: Mild congestive heart failure.   Electronically Signed   By: Andreas Newport M.D.    On: 08/14/2014 06:43    Assessment:  Principal Problem:   Acute on chronic diastolic CHF (congestive heart failure), NYHA class 4 Active Problems:   Paroxysmal atrial fibrillation, recurrent   Hyperlipidemia   HTN (hypertension)   Coronary artery disease   Aortic valvular stenosis, moderate to severe   CHF (congestive heart failure)   Plan:  1. Acute on chronic diastolic CHF - NYHA Class 4 - little extra urine output. Increase ethacrynic acid dose to 50 mg BID. Will check BNP in the am tomorrow. 2. Rate-dependent LBBB - not clear that this is due to ischemia. No chest pain. On amiodarone. Lopressor 12.5 mg BID - will increase to 25 mg BID. 3. PAF - cardioversion last week. Appears to be in sinus tach with rate depended LBBB. Plan to increase BB. 4. HTN - bp elevated, increasing BB. 5. Moderate aortic stenosis - stable  6. Dyslipidemia - continue atorvastatin.  Ok to transfer to telemetry floor today.  Time Spent Directly with Patient:  30 minutes  Length of Stay:  LOS: 1 day   Pixie Casino, MD, [facility] Attending Cardiologist [facility]  Mindee Robledo C 08/15/2014, 11:59 AM

## 2014-08-15 NOTE — Telephone Encounter (Signed)
Closed encounter °

## 2014-08-16 DIAGNOSIS — R21 Rash and other nonspecific skin eruption: Secondary | ICD-10-CM

## 2014-08-16 DIAGNOSIS — I10 Essential (primary) hypertension: Secondary | ICD-10-CM

## 2014-08-16 DIAGNOSIS — I447 Left bundle-branch block, unspecified: Secondary | ICD-10-CM

## 2014-08-16 LAB — PROTIME-INR
INR: 1.28 (ref 0.00–1.49)
Prothrombin Time: 16.2 seconds — ABNORMAL HIGH (ref 11.6–15.2)

## 2014-08-16 LAB — BASIC METABOLIC PANEL
ANION GAP: 8 (ref 5–15)
BUN: 18 mg/dL (ref 6–23)
CALCIUM: 8.6 mg/dL (ref 8.4–10.5)
CHLORIDE: 106 mmol/L (ref 96–112)
CO2: 27 mmol/L (ref 19–32)
CREATININE: 1.05 mg/dL (ref 0.50–1.10)
GFR, EST AFRICAN AMERICAN: 53 mL/min — AB (ref >90)
GFR, EST NON AFRICAN AMERICAN: 46 mL/min — AB (ref >90)
Glucose, Bld: 88 mg/dL (ref 70–99)
Potassium: 4.2 mmol/L (ref 3.5–5.1)
Sodium: 141 mmol/L (ref 135–145)

## 2014-08-16 LAB — CBC
HCT: 36.5 % (ref 36.0–46.0)
HEMOGLOBIN: 11.7 g/dL — AB (ref 12.0–15.0)
MCH: 31.2 pg (ref 26.0–34.0)
MCHC: 32.1 g/dL (ref 30.0–36.0)
MCV: 97.3 fL (ref 78.0–100.0)
Platelets: 193 10*3/uL (ref 150–400)
RBC: 3.75 MIL/uL — AB (ref 3.87–5.11)
RDW: 13.9 % (ref 11.5–15.5)
WBC: 9.2 10*3/uL (ref 4.0–10.5)

## 2014-08-16 MED ORDER — WARFARIN SODIUM 4 MG PO TABS
4.0000 mg | ORAL_TABLET | Freq: Once | ORAL | Status: AC
  Administered 2014-08-16: 4 mg via ORAL
  Filled 2014-08-16 (×2): qty 1

## 2014-08-16 NOTE — Progress Notes (Signed)
1845 transferred in  To 2 H via wheelchair with Mildrione iv  Infusion at 2.5 mgs /min =7.9 ml/hr. Report given to [facility]

## 2014-08-16 NOTE — Progress Notes (Signed)
Patient Name: [name] Date of Encounter: 08/16/2014   Principal Problem:   Acute on chronic diastolic CHF (congestive heart failure), NYHA class 4 Active Problems:   HTN (hypertension)   Coronary artery disease   Aortic valvular stenosis, moderate to severe   Hyperlipidemia   PAF (paroxysmal atrial fibrillation)   Rash   Intermittent LBBB    SUBJECTIVE  Feeling better this AM.  Breathing improved @ rest.  Eager to be up and about.  Review of tele shows ongoing intermittent LBBB with tachycardia @ 108 along with intermittent, rate-controlled afib.  Pt denies palpitations.  CURRENT MEDS . amiodarone  400 mg Oral Daily  . aspirin EC  81 mg Oral Daily  . atorvastatin  40 mg Oral q1800  . ethacrynic acid  50 mg Oral BID  . ezetimibe  10 mg Oral Daily  . folic acid  2 mg Oral Daily  . levothyroxine  88 mcg Oral QAC breakfast  . loratadine  10 mg Oral Daily  . metoprolol tartrate  25 mg Oral BID  . multivitamin with minerals  1 tablet Oral Daily  . potassium chloride SA  10 mEq Oral Daily  . sodium chloride  3 mL Intravenous Q12H  . triamcinolone cream  1 application Topical BID  . Warfarin - Pharmacist Dosing Inpatient   Does not apply q1800    OBJECTIVE  Filed Vitals:   08/15/14 1612 08/15/14 2109 08/16/14 0149 08/16/14 0630  BP: 112/66 107/58 101/37 112/47  Pulse: 108 70 62 58  Temp: 98.7 F (37.1 C) 98.1 F (36.7 C) 98.3 F (36.8 C) 97.4 F (36.3 C)  TempSrc: Oral Oral Oral Oral  Resp: 20 20 20 18   Height: 5\' 3"  (1.6 m)     Weight: 141 lb 6.4 oz (64.139 kg)   139 lb 4.8 oz (63.186 kg)  SpO2: 95% 98% 98% 95%    Intake/Output Summary (Last 24 hours) at 08/16/14 0845 Last data filed at 08/16/14 0630  Gross per 24 hour  Intake    600 ml  Output   2900 ml  Net  -2300 ml   Filed Weights   08/15/14 0300 08/15/14 1612 08/16/14 0630  Weight: 143 lb 4.8 oz (65 kg) 141 lb 6.4 oz (64.139 kg) 139 lb 4.8 oz (63.186 kg)    PHYSICAL EXAM  General: Pleasant,  NAD. Neuro: Alert and oriented X 3. Moves all extremities spontaneously. Psych: Normal affect. HEENT:  Normal  Neck: Supple without bruits or JVD. Lungs:  Resp regular and unlabored, CTA. Heart: RRR no s3, s4, 2/6 SEM throughout. Abdomen: Soft, non-tender, non-distended, BS + x 4.  Extremities: No clubbing, cyanosis or edema. DP/PT/Radials 2+ and equal bilaterally.  Accessory Clinical Findings  CBC  Recent Labs  08/14/14 0610 08/15/14 0251 08/16/14 0340  WBC 11.4* 8.5 9.2  NEUTROABS 7.8*  --   --   HGB 13.5 10.7* 11.7*  HCT 42.6 33.4* 36.5  MCV 99.3 98.2 97.3  PLT 210 184 759   Basic Metabolic Panel  Recent Labs  08/15/14 0251 08/16/14 0340  NA 143 141  K 4.2 4.2  CL 109 106  CO2 27 27  GLUCOSE 98 88  BUN 17 18  CREATININE 1.12* 1.05  CALCIUM 8.5 8.6   Liver Function Tests  Recent Labs  08/15/14 0251  AST 21  ALT 16  ALKPHOS 65  BILITOT 1.0  PROT 6.0  ALBUMIN 2.9*   Cardiac Enzymes  Recent Labs  08/14/14 0610  TROPONINI <  0.03   Lab Results  Component Value Date   INR 1.28 08/16/2014   INR 1.35 08/15/2014   INR 1.25 08/14/2014    TELE  She has been having intermittent, rate-related lbbb @ 108 bpm.  Also periods of afib in the 70's.  Sinus 1st deg avb since ~ midnight.  Radiology/Studies  Dg Chest Portable 1 View  08/14/2014   CLINICAL DATA:  Dyspnea for 1 hour.  Cardioversion 3 days ago  EXAM: PORTABLE CHEST - 1 VIEW  COMPARISON:  01/02/2013  FINDINGS: There is mild unchanged cardiomegaly. There is no airspace consolidation. There is no large effusion. There is apical pleural thickening which is unchanged. There is mild vascular and interstitial prominence which is worsened.  IMPRESSION: Mild congestive heart failure.   Electronically Signed   By: Andreas Newport M.D.   On: 08/14/2014 06:43   ASSESSMENT AND PLAN  1.  Acute on chronic diastolic chf:  Minus 2.6 L for admission with drop in wt from 143 to 139 since admission.  Renal fxn  stable.  Tolerating edecrin @ current dose and breathing improved.  Euvolemic on exam.  BP stable.  HR variable in setting of intermittent lbbb.  She also appears to have intermittent afib, which is rate-controlled.  Cont current regimen.  May ultimately need to back down on edecrin to 25 daily.  Renal fxn stable.  2.  PAF:  Asymptomatic.  Currently in sinus with a 1st deg avb.  Review of tele shows intermittent, rate-controlled afib along with intermittent lbbb/tachycardia.  She is on amio 400 daily, lopressor 25 bid, and coumadin (subRx INR).  Will review strips with Dr. Martinique - ? EP eval.  3.  CAD:  No chest pain.  One troponin was nl.  ECG w/o ischemic changes but she does have intermittent tachycardia with lbbb.  She does have a h/o PCI of RCA in 2010.  No recent myoview.  Consider obtaining if dyspnea present with ambulation today.  Cont asa, statin, bb.  4.  HTN:  Stable.    5.  HL: cont statin.  LDL 74 in 01/2014.  LFT's wnl this admission.  6.  Intermittent tachycardia with LBBB:  As noted above, pt continues to have intermittent tachycardia @ 108 with LBBB.  Cont amio/bb.  Will d/w Dr. Martinique.  7.  Rash:  No change.  Presumed to be 2/2 diuretics in setting of sulfa allergy.  She has been off of Torsemide for a week now.  She sees derm as an outpt.  Signed, Sheryl Hodgkins NP Patient seen and examined and history reviewed. Agree with above findings and plan. She did experience some dyspnea on exertion with her first ambulation in the halls. She has minor palpitations. She has responded well to diuresis. Will continue current dose for now. Telemetry reviewed she is in NSR with some brief runs of afib. She has some NSVT and also has intermittent LBBB aberrancy. I would continue metoprolol and amiodarone for now. Continue to monitor symptoms as she increases ambulation.   [name], [place] 08/16/2014 12:30 PM

## 2014-08-16 NOTE — Progress Notes (Signed)
1800 xray done s/p picc line . Result relayed by IV tam staff May use Picc line

## 2014-08-16 NOTE — Progress Notes (Signed)
ANTICOAGULATION CONSULT NOTE - Follow Up Consult  Pharmacy Consult for Coumadin Indication: atrial fibrillation  Allergies  Allergen Reactions  . Furosemide Rash  . Sulfa Antibiotics Itching and Rash  . Torsemide Rash    Patient Measurements: Height: 5\' 3"  (160 cm) Weight: 139 lb 4.8 oz (63.186 kg) (scale c) IBW/kg (Calculated) : 52.4  Vital Signs: Temp: 97.4 F (36.3 C) (03/22 1332) Temp Source: Oral (03/22 1332) BP: 137/53 mmHg (03/22 1332) Pulse Rate: 57 (03/22 1332)  Labs:  Recent Labs  08/14/14 0610 08/15/14 0251 08/16/14 0340  HGB 13.5 10.7* 11.7*  HCT 42.6 33.4* 36.5  PLT 210 184 193  LABPROT 15.7* 16.8* 16.2*  INR 1.25 1.35 1.28  CREATININE 1.06 1.12* 1.05  TROPONINI <0.03  --   --     Estimated Creatinine Clearance: 33.2 mL/min (by C-G formula based on Cr of 1.05).  Assessment:  INR remains subtherapeutic at 1.28.   INR 1.25 on admission 3/20, and has had 2.5 mg then 3 mg of Coumadin.  INR was 4.4 on 3/5 AC visit and told to hold a couple doses and continue 2.5mg  daily. However pt reported taking 1.25mg  daily exc 2.5mg  Mon/Fri.   Goal of Therapy:  INR 2-3 Monitor platelets by anticoagulation protocol: Yes   Plan:   Coumadin 4 mg x 1 today.  Discussed with patient.  Continue daily PT/INR.  Arty Baumgartner, [place] Pager: [phone] 08/16/2014,2:06 PM

## 2014-08-16 NOTE — Clinical Documentation Improvement (Addendum)
"  She has some NSVT and also has intermittent LBBB aberrancy" is documented in progress note 08/16/14.  Please clarify "NSVT" as it applies to this specific record:   - Nonsustained Supraventricular Tachycardia  - Nonsustained Ventricular Tachycardia  - Other Rhythm/Condition  - Unable to Clinically Determine   Thank You, Erling Conte ,RN Clinical Documentation Specialist:  [phone] [place] Information Management

## 2014-08-17 ENCOUNTER — Telehealth: Payer: Self-pay | Admitting: Cardiovascular Disease

## 2014-08-17 ENCOUNTER — Ambulatory Visit: Payer: Medicare Other | Admitting: Pharmacist Clinician (PhC)/ Clinical Pharmacy Specialist

## 2014-08-17 LAB — BASIC METABOLIC PANEL
Anion gap: 6 (ref 5–15)
BUN: 22 mg/dL (ref 6–23)
CO2: 28 mmol/L (ref 19–32)
Calcium: 8.6 mg/dL (ref 8.4–10.5)
Chloride: 107 mmol/L (ref 96–112)
Creatinine, Ser: 1.22 mg/dL — ABNORMAL HIGH (ref 0.50–1.10)
GFR calc Af Amer: 44 mL/min — ABNORMAL LOW (ref >90)
GFR, EST NON AFRICAN AMERICAN: 38 mL/min — AB (ref >90)
Glucose, Bld: 102 mg/dL — ABNORMAL HIGH (ref 70–99)
Potassium: 3.8 mmol/L (ref 3.5–5.1)
Sodium: 141 mmol/L (ref 135–145)

## 2014-08-17 LAB — CBC
HCT: 35.5 % — ABNORMAL LOW (ref 36.0–46.0)
Hemoglobin: 11.1 g/dL — ABNORMAL LOW (ref 12.0–15.0)
MCH: 30.7 pg (ref 26.0–34.0)
MCHC: 31.3 g/dL (ref 30.0–36.0)
MCV: 98.3 fL (ref 78.0–100.0)
Platelets: 218 10*3/uL (ref 150–400)
RBC: 3.61 MIL/uL — ABNORMAL LOW (ref 3.87–5.11)
RDW: 13.6 % (ref 11.5–15.5)
WBC: 8.5 10*3/uL (ref 4.0–10.5)

## 2014-08-17 LAB — PROTIME-INR
INR: 1.19 (ref 0.00–1.49)
PROTHROMBIN TIME: 15.3 s — AB (ref 11.6–15.2)

## 2014-08-17 MED ORDER — AMIODARONE HCL 200 MG PO TABS: 400.0000 mg | ORAL_TABLET | Freq: Every day | ORAL | Status: DC

## 2014-08-17 MED ORDER — WARFARIN SODIUM 4 MG PO TABS
4.0000 mg | ORAL_TABLET | Freq: Once | ORAL | Status: AC
  Administered 2014-08-17: 4 mg via ORAL
  Filled 2014-08-17: qty 1

## 2014-08-17 MED ORDER — ENOXAPARIN SODIUM 60 MG/0.6ML ~~LOC~~ SOLN
60.0000 mg | SUBCUTANEOUS | Status: DC
  Administered 2014-08-17: 60 mg via SUBCUTANEOUS
  Filled 2014-08-17 (×2): qty 0.6

## 2014-08-17 NOTE — Telephone Encounter (Signed)
Pacerone dose, freq, refills clarified OTP w pharmacy.

## 2014-08-17 NOTE — Progress Notes (Signed)
ANTICOAGULATION CONSULT NOTE - Follow Up Consult  Pharmacy Consult for Coumadin and Lovenox Indication: atrial fibrillation  Allergies  Allergen Reactions  . Furosemide Rash  . Sulfa Antibiotics Itching and Rash  . Torsemide Rash    Patient Measurements: Height: 5\' 3"  (160 cm) Weight: 140 lb 3.2 oz (63.594 kg) (scale C) IBW/kg (Calculated) : 52.4  Vital Signs: Temp: 98.1 F (36.7 C) (03/23 1356) Temp Source: Oral (03/23 1356) BP: 110/45 mmHg (03/23 1356) Pulse Rate: 57 (03/23 1356)  Labs:  Recent Labs  08/15/14 0251 08/16/14 0340 08/17/14 0353  HGB 10.7* 11.7* 11.1*  HCT 33.4* 36.5 35.5*  PLT 184 193 218  LABPROT 16.8* 16.2* 15.3*  INR 1.35 1.28 1.19  CREATININE 1.12* 1.05 1.22*    Estimated Creatinine Clearance: 28.6 mL/min (by C-G formula based on Cr of 1.22).  Assessment:  INR remains subtherapeutic at 1.16.  INR 1.25 on admission 3/20, and has had 2.5 mg, 3 mg, 4 mg of Coumadin.  INR was 4.4 on 3/5 AC visit and told to hold a couple doses and continue 2.5mg  daily. However pt reported taking 1.25mg  daily exc 2.5mg  Mon/Fri.   Goal of Therapy:  INR 2-3 Monitor platelets by anticoagulation protocol: Yes   Plan:   Add Lovenox 60 mg sq q24hrs (for crcl just under 30 ml/min)  Coumadin 4 mg x again today.  Discussed with patient.  Continue daily PT/INR.  We also discussed where she could get [name] after discharge. She usually uses [facility], mail order.  Unknown if they will be able to get it for her.  I called her [facility] and [facility]), and neither can get it.  I also called [facility], who can also not get [name].       But, [facility] can get it. It takes 2 days to obtain, and they have ordered a bottle of 100, which should arrive on Friday, 08/19/14 (Expensive med, and would be close to $120 for #100 with her co-pay.)   I added [facility] to her Pharmacy list, so that an e-prescription can be sent  prior to discharge.  Discussed with L. Dorene Ar, NP.  Arty Baumgartner, [place] Pager: [phone] 08/17/2014,2:10 PM

## 2014-08-17 NOTE — Telephone Encounter (Signed)
°  1. Which medications need to be refilled? Amiodarone  2. Which pharmacy is medication to be sent to? [facility]  3. Do they need a 30 day or 90 day supply? 90   4. Would they like a call back once the medication has been sent to the pharmacy? yes

## 2014-08-17 NOTE — Care Management Note (Addendum)
    Page 1 of 1   08/19/2014     3:00:44 PM CARE MANAGEMENT NOTE 08/19/2014  Patient:  [name], [name]   Account Number:  [id number]  Date Initiated:  08/15/2014  Documentation initiated by:  Elissa Hefty  Subjective/Objective Assessment:   adm w chf     Action/Plan:   lives w husband, cp dr Jill Alexanders   Anticipated DC Date:  08/18/2014   Anticipated DC Plan:  [place]         Choice offered to / List presented to:             Status of service:  Completed, signed off Medicare Important Message given?  YES (If response is "NO", the following Medicare IM given date fields will be blank) Date Medicare IM given:  08/17/2014 Medicare IM given by:  Maninder Deboer Date Additional Medicare IM given:   Additional Medicare IM given by:    Discharge Disposition:  HOME/SELF CARE  Per UR Regulation:  Reviewed for med. necessity/level of care/duration of stay  If discussed at [place] of Stay Meetings, dates discussed:    Comments:  08/19/14 Ellan Lambert, RN, BSN [phone] No home Lovenox needed, per MD.  Pt to pick up [place] at [place] when med comes in today by Fed [employer]. Pharmacy to notify NP when med arrives.   08/19/14 Ellan Lambert, RN, BSN [phone]  /W DATHME  @ [place] #@ [phone]  ENOXAPARIN  80 MG Q12H X 5 DAYS TOTAL OF 10 SYRINGES  COVER-YES CO-PAY- $ 655.25 TIER-3 DRUG PRIORAPPROVAL -NO

## 2014-08-17 NOTE — Progress Notes (Signed)
Subjective: Feels better, does become dizzy somewhat with ambulation  Objective: Vital signs in last 24 hours: Temp:  [97.4 F (36.3 C)-98.1 F (36.7 C)] 98 F (36.7 C) (03/23 0458) Pulse Rate:  [55-62] 58 (03/23 0458) Resp:  [16] 16 (03/23 0458) BP: (116-145)/(49-56) 145/56 mmHg (03/23 0458) SpO2:  [94 %-100 %] 94 % (03/23 0458) Weight:  [140 lb 3.2 oz (63.594 kg)] 140 lb 3.2 oz (63.594 kg) (03/23 0458) Weight change: -1 lb 3.2 oz (-0.544 kg) Last BM Date: 08/16/14 Intake/Output from previous day: 03/22 0701 - 03/23 0700 In: 1322 [P.O.:1322] Out: 900 [Urine:900] Intake/Output this shift: Total I/O In: -  Out: 150 [Urine:150]  PE: General:Pleasant affect, NAD Skin:Warm and dry, brisk capillary refill, rash on back and chest HEENT:normocephalic, sclera clear, mucus membranes moist Neck:supple, no JVD  Heart:S1S2 irreg irreg with 2/6 systolic murmur, no gallup, rub or click Lungs:clear without rales, rhonchi, or wheezes IEP:PIRJ, non tender, + BS, do not palpate liver spleen or masses Ext:no lower ext edema, 2+ pedal pulses, 2+ radial pulses Neuro:alert and oriented, MAE, follows commands, + facial symmetry Tele:  A fib with NSVT vs aberrancy  Lab Results:  Recent Labs  08/16/14 0340 08/17/14 0353  WBC 9.2 8.5  HGB 11.7* 11.1*  HCT 36.5 35.5*  PLT 193 218   BMET  Recent Labs  08/16/14 0340 08/17/14 0353  NA 141 141  K 4.2 3.8  CL 106 107  CO2 27 28  GLUCOSE 88 102*  BUN 18 22  CREATININE 1.05 1.22*  CALCIUM 8.6 8.6   No results for input(s): TROPONINI in the last 72 hours.  Invalid input(s): CK, MB  Lab Results  Component Value Date   CHOL 139 01/26/2014   HDL 45 01/26/2014   LDLCALC 74 01/26/2014   TRIG 98 01/26/2014   CHOLHDL 3.1 01/26/2014   No results found for: HGBA1C   Lab Results  Component Value Date   TSH 0.967 08/09/2014    Hepatic Function Panel  Recent Labs  08/15/14 0251  PROT 6.0  ALBUMIN 2.9*  AST 21    ALT 16  ALKPHOS 65  BILITOT 1.0   No results for input(s): CHOL in the last 72 hours. No results for input(s): PROTIME in the last 72 hours.     Studies/Results: No results found.  Medications: I have reviewed the patient's current medications. Scheduled Meds: . amiodarone  400 mg Oral Daily  . aspirin EC  81 mg Oral Daily  . atorvastatin  40 mg Oral q1800  . ethacrynic acid  50 mg Oral BID  . ezetimibe  10 mg Oral Daily  . folic acid  2 mg Oral Daily  . levothyroxine  88 mcg Oral QAC breakfast  . loratadine  10 mg Oral Daily  . metoprolol tartrate  25 mg Oral BID  . multivitamin with minerals  1 tablet Oral Daily  . potassium chloride SA  10 mEq Oral Daily  . sodium chloride  3 mL Intravenous Q12H  . triamcinolone cream  1 application Topical BID  . Warfarin - Pharmacist Dosing Inpatient   Does not apply q1800   Continuous Infusions:  PRN Meds:.sodium chloride, acetaminophen, diphenhydrAMINE, ondansetron (ZOFRAN) IV, sodium chloride  Assessment/Plan: 1. Acute on chronic diastolic CHF - NYHA Class 4 - little extra urine output. Increased ethacrynic acid dose to 50 mg BID.  -2198 since admit wt down 3 lbs.  2. Rate-dependent LBBB - not clear that this  is due to ischemia. No chest pain.  Negative troponin On amiodarone. Lopressor 25 mg BID. 3. PAF - cardioversion last week. Has been in both sinus tach with rate depended LBBB and a fib with LBBB. Increased BB.  Also with non sustained V tach on monitor vs aberrancy .  4. HTN - bp elevated, increasing BB. 5. Moderate aortic stenosis - stable 6. Dyslipidemia - continue atorvastatin 7. Rash from sulfa allergy and torsemide, now on edecrin, may need to order from her pharmacy prior to discharge 8. CKD 3 with Cr elevated today 1.06-->1.12--> 1.05-->1.22 may need to decrease edecrin to 25 mg today 9. Anticoagulation sub Rx INR today 1.19 ? Add lovenox for now 10.   LOS: 3 days   Time spent with pt. :15 minutes. [facility] R   Nurse Practitioner Certified Pager [phone] or after 5pm and on weekends call [phone] 08/17/2014, 10:16 AM   Patient seen, examined. Available data reviewed. Agree with findings, assessment, and plan as outlined by Cecilie Kicks, NP. The patient is independently interviewed and examined. Her husband is at the bedside. Seems to have had a good initial response to ethacrynic acid. She is allergic to anything containing sulfa. Agree that we should continue her current strategy for atrial fibrillation with amiodarone and metoprolol. Patient is anticoagulated with warfarin. With recent cardioversion I think she should be treated with Lovenox in the setting of a subtherapeutic INR.  On my exam and review of telemetry, the patient is in sinus rhythm with a regular rate. She has a mid peaking harsh systolic murmur consistent with moderate aortic stenosis. She has no peripheral edema today.  Sherren Mocha, M.D. 08/17/2014 11:43 AM

## 2014-08-17 NOTE — Telephone Encounter (Signed)
Legrand Como called in wanting to verify the pt's dosage for their Amiodarone prescription. Please cal back  Thanks

## 2014-08-17 NOTE — Telephone Encounter (Signed)
Rx(s) sent to pharmacy electronically.  

## 2014-08-17 NOTE — Telephone Encounter (Signed)
Spoke to representative  [place] quantity of amiodarone from 30 tablets to 90 tablets okay'd with 3 refills awaitng to talk w/pharmacist  disconnected

## 2014-08-18 ENCOUNTER — Ambulatory Visit: Payer: Medicare Other

## 2014-08-18 LAB — PROTIME-INR
INR: 1.42 (ref 0.00–1.49)
Prothrombin Time: 17.5 seconds — ABNORMAL HIGH (ref 11.6–15.2)

## 2014-08-18 LAB — CBC
HEMATOCRIT: 35.4 % — AB (ref 36.0–46.0)
Hemoglobin: 11.3 g/dL — ABNORMAL LOW (ref 12.0–15.0)
MCH: 31 pg (ref 26.0–34.0)
MCHC: 31.9 g/dL (ref 30.0–36.0)
MCV: 97.3 fL (ref 78.0–100.0)
Platelets: 247 10*3/uL (ref 150–400)
RBC: 3.64 MIL/uL — ABNORMAL LOW (ref 3.87–5.11)
RDW: 13.5 % (ref 11.5–15.5)
WBC: 8.3 10*3/uL (ref 4.0–10.5)

## 2014-08-18 LAB — BASIC METABOLIC PANEL
Anion gap: 8 (ref 5–15)
BUN: 23 mg/dL (ref 6–23)
CHLORIDE: 106 mmol/L (ref 96–112)
CO2: 27 mmol/L (ref 19–32)
CREATININE: 1.04 mg/dL (ref 0.50–1.10)
Calcium: 8.8 mg/dL (ref 8.4–10.5)
GFR calc Af Amer: 54 mL/min — ABNORMAL LOW (ref >90)
GFR calc non Af Amer: 47 mL/min — ABNORMAL LOW (ref >90)
Glucose, Bld: 91 mg/dL (ref 70–99)
POTASSIUM: 3.8 mmol/L (ref 3.5–5.1)
SODIUM: 141 mmol/L (ref 135–145)

## 2014-08-18 MED ORDER — WARFARIN SODIUM 5 MG PO TABS
5.0000 mg | ORAL_TABLET | Freq: Once | ORAL | Status: AC
  Administered 2014-08-18: 5 mg via ORAL
  Filled 2014-08-18: qty 1

## 2014-08-18 MED ORDER — ENOXAPARIN SODIUM 60 MG/0.6ML ~~LOC~~ SOLN
60.0000 mg | Freq: Two times a day (BID) | SUBCUTANEOUS | Status: DC
Start: 2014-08-18 — End: 2014-08-19
  Administered 2014-08-18 – 2014-08-19 (×3): 60 mg via SUBCUTANEOUS
  Filled 2014-08-18 (×4): qty 0.6

## 2014-08-18 NOTE — Progress Notes (Signed)
Patient Profile: 81 y/o female with h/o chronic diastolic CHF, PAF s/p recent cardioversion on warfarin, moderate AS, HTN, HLD and stage 3 CKD admitted for acute on chronic diastolic CHF. Note: patient with sulfa allergy and being diuresed with ethacrynic acid.   Subjective: Feels better. Breathing improved. Ambulating well.   Objective: Vital signs in last 24 hours: Temp:  [97.1 F (36.2 C)-98.2 F (36.8 C)] 97.1 F (36.2 C) (03/24 0458) Pulse Rate:  [55-61] 55 (03/24 0458) Resp:  [17-18] 18 (03/24 0458) BP: (110-145)/(45-62) 138/52 mmHg (03/24 0458) SpO2:  [95 %-100 %] 95 % (03/24 0458) Weight:  [140 lb (63.504 kg)] 140 lb (63.504 kg) (03/24 0458) Last BM Date: 08/16/14  Intake/Output from previous day: 03/23 0701 - 03/24 0700 In: 642 [P.O.:642] Out: 1550 [Urine:1550] Intake/Output this shift:    Medications Current Facility-Administered Medications  Medication Dose Route Frequency Provider Last Rate Last Dose  . 0.9 %  sodium chloride infusion  250 mL Intravenous PRN Rogelia Mire, NP      . acetaminophen (TYLENOL) tablet 650 mg  650 mg Oral Q4H PRN Rogelia Mire, NP   650 mg at 08/18/14 0411  . amiodarone (PACERONE) tablet 400 mg  400 mg Oral Daily Rogelia Mire, NP   400 mg at 08/17/14 1125  . aspirin EC tablet 81 mg  81 mg Oral Daily Rogelia Mire, NP   81 mg at 08/17/14 1129  . atorvastatin (LIPITOR) tablet 40 mg  40 mg Oral q1800 Rogelia Mire, NP   40 mg at 08/17/14 1705  . diphenhydrAMINE (BENADRYL) capsule 25 mg  25 mg Oral QHS PRN Pixie Casino, MD   25 mg at 08/17/14 2133  . enoxaparin (LOVENOX) injection 60 mg  60 mg Subcutaneous Q24H Skeet Simmer, RPH   60 mg at 08/17/14 1458  . ethacrynic acid ([name]) tablet 50 mg  50 mg Oral BID Pixie Casino, MD   50 mg at 08/17/14 1742  . ezetimibe (ZETIA) tablet 10 mg  10 mg Oral Daily Rogelia Mire, NP   10 mg at 08/17/14 1125  . folic acid (FOLVITE) tablet 2 mg  2 mg Oral Daily  Rogelia Mire, NP   2 mg at 08/17/14 1124  . levothyroxine (SYNTHROID, LEVOTHROID) tablet 88 mcg  88 mcg Oral QAC breakfast Rogelia Mire, NP   88 mcg at 08/17/14 1124  . loratadine (CLARITIN) tablet 10 mg  10 mg Oral Daily Rogelia Mire, NP   10 mg at 08/17/14 1124  . metoprolol tartrate (LOPRESSOR) tablet 25 mg  25 mg Oral BID Pixie Casino, MD   25 mg at 08/17/14 1124  . multivitamin with minerals tablet 1 tablet  1 tablet Oral Daily Sueanne Margarita, MD   1 tablet at 08/17/14 1123  . ondansetron (ZOFRAN) injection 4 mg  4 mg Intravenous Q6H PRN Rogelia Mire, NP      . potassium chloride (K-DUR,KLOR-CON) CR tablet 10 mEq  10 mEq Oral Daily Rogelia Mire, NP   10 mEq at 08/17/14 1123  . sodium chloride 0.9 % injection 3 mL  3 mL Intravenous Q12H Rogelia Mire, NP   3 mL at 08/17/14 2135  . sodium chloride 0.9 % injection 3 mL  3 mL Intravenous PRN Rogelia Mire, NP      . triamcinolone cream (KENALOG) 0.1 % 1 application  1 application Topical BID Rogelia Mire, NP   1 application at  08/17/14 2130  . Warfarin - Pharmacist Dosing Inpatient   Does not apply q1800 Reginia Naas, RPH   0  at 08/14/14 1800   Filed Weights   08/16/14 0630 08/17/14 0458 08/18/14 0458  Weight: 139 lb 4.8 oz (63.186 kg) 140 lb 3.2 oz (63.594 kg) 140 lb (63.504 kg)    PE: General appearance: alert, cooperative and no distress Neck: no carotid bruit and no JVD Lungs: clear to auscultation bilaterally Heart: regular rate and rhythm and 3/6 SM heard throughout the precordium. Loudest along RUSB Extremities: no LEE Pulses: 2+ and symmetric Skin: warm and dry Neurologic: Grossly normal  Lab Results:   Recent Labs  08/16/14 0340 08/17/14 0353 08/18/14 0510  WBC 9.2 8.5 8.3  HGB 11.7* 11.1* 11.3*  HCT 36.5 35.5* 35.4*  PLT 193 218 247   BMET  Recent Labs  08/16/14 0340 08/17/14 0353 08/18/14 0510  NA 141 141 141  K 4.2 3.8 3.8  CL 106 107 106    CO2 27 28 27   GLUCOSE 88 102* 91  BUN 18 22 23   CREATININE 1.05 1.22* 1.04  CALCIUM 8.6 8.6 8.8   PT/INR  Recent Labs  08/16/14 0340 08/17/14 0353 08/18/14 0510  LABPROT 16.2* 15.3* 17.5*  INR 1.28 1.19 1.42   Cardiac Panel (last 3 results) No results for input(s): CKTOTAL, CKMB, TROPONINI, RELINDX in the last 72 hours.   Assessment/Plan  Principal Problem:   Acute on chronic diastolic CHF (congestive heart failure), NYHA class 4 Active Problems:   Hyperlipidemia   HTN (hypertension)   Coronary artery disease   Aortic valvular stenosis, moderate to severe   PAF (paroxysmal atrial fibrillation)   Rash   Intermittent LBBB   1. Acute on Chronic Diastolic CHF: continues to diurese well. -1.55 L in last 24 hrs. -3.1L total since admission. Euvolemic on physical exam. Ambulating well. Continue Ethacrynic acid for diuresis given sulfa allergy. Continue BB. BP is controlled.   2. PAF: s/p cardioversion 08/10/14. Maintaining NSR on PO amiodarone and metoprolol. Continue Warfarin + Lovenox bridge for anticoagulation.   3. Moderate AS: stable.   4. HTN: controlled on current meds. Continue BB.   5. HLD: continue statin therapy with Lipitor  6. CKD: Scr improved and normalized 1.05-->1.22-->1.04  7. Chronic Anticoagulation: on warfarin for PAF. INR subtherapeutic at 1.42. Lovenox initiated yesterday as bridge given recent cardioversion. Continue to check daily INRs. D/C Lovenox once therapeutic. INR goal is 2.0-3.0.    Dispo: possible d/c home later today or tomorrow on Lovenox.    LOS: 4 days    Brittainy M. Ladoris Gene 08/18/2014 8:25 AM  Patient seen, examined. Available data reviewed. Agree with findings, assessment, and plan as outlined by Lyda Jester, PA-C. The patient was independently interviewed and examined. Her lung fields are clear. She is on Lovenox because of a subtherapeutic INR after cardioversion. We are awaiting availability of ethacrynic acid at  the outpatient pharmacy. I would anticipate hospital discharge tomorrow and suspect she will require Lovenox for a few days until her INR is therapeutic. Will check to see if we can obtain Lovenox for her as an outpatient.  Sherren Mocha, M.D. 08/18/2014 12:16 PM

## 2014-08-18 NOTE — Progress Notes (Signed)
ANTICOAGULATION CONSULT NOTE - Follow Up Consult  Pharmacy Consult for Coumadin and Lovenox Indication: atrial fibrillation  Allergies  Allergen Reactions  . Furosemide Rash  . Sulfa Antibiotics Itching and Rash  . Torsemide Rash    Patient Measurements: Height: 5\' 3"  (160 cm) Weight: 140 lb (63.504 kg) (scale C) IBW/kg (Calculated) : 52.4  Vital Signs: Temp: 97.6 F (36.4 C) (03/24 0949) Temp Source: Oral (03/24 0949) BP: 126/50 mmHg (03/24 0949) Pulse Rate: 57 (03/24 0949)  Labs:  Recent Labs  08/16/14 0340 08/17/14 0353 08/18/14 0510  HGB 11.7* 11.1* 11.3*  HCT 36.5 35.5* 35.4*  PLT 193 218 247  LABPROT 16.2* 15.3* 17.5*  INR 1.28 1.19 1.42  CREATININE 1.05 1.22* 1.04    Estimated Creatinine Clearance: 33.5 mL/min (by C-G formula based on Cr of 1.04).  Assessment: Pt on Coumadin for Afib. INR up to 1.42 - moving up slowly. Also on Lovenox bridge. CBC stable. No bleeding noted. TEE DCCV done last week. Pt reported home dose as 1.25mg  daily exc 2.5mg  Mon/Fri, but INR 4.4 at [facility] clinic on 3/15 states pt was to hold 2 doses and then resume 2.5mg  daily  Goal of Therapy:  INR 2-3 Monitor platelets by anticoagulation protocol: Yes   Plan:  Change Lovenox to 60 mg sq q12hrs Coumadin 5 mg today Daily PT/INR  Sherlon Handing, PharmD, BCPS Clinical pharmacist, pager [phone] 08/18/2014,10:23 AM

## 2014-08-19 ENCOUNTER — Telehealth: Payer: Self-pay | Admitting: *Deleted

## 2014-08-19 LAB — CBC
HCT: 35 % — ABNORMAL LOW (ref 36.0–46.0)
Hemoglobin: 11.2 g/dL — ABNORMAL LOW (ref 12.0–15.0)
MCH: 30.9 pg (ref 26.0–34.0)
MCHC: 32 g/dL (ref 30.0–36.0)
MCV: 96.7 fL (ref 78.0–100.0)
Platelets: 254 10*3/uL (ref 150–400)
RBC: 3.62 MIL/uL — ABNORMAL LOW (ref 3.87–5.11)
RDW: 13.5 % (ref 11.5–15.5)
WBC: 7.7 10*3/uL (ref 4.0–10.5)

## 2014-08-19 LAB — PROTIME-INR
INR: 1.78 — AB (ref 0.00–1.49)
Prothrombin Time: 20.9 seconds — ABNORMAL HIGH (ref 11.6–15.2)

## 2014-08-19 MED ORDER — ETHACRYNIC ACID 25 MG PO TABS: 50.0000 mg | ORAL_TABLET | Freq: Two times a day (BID) | ORAL | Status: DC

## 2014-08-19 MED ORDER — ENOXAPARIN (LOVENOX) PATIENT EDUCATION KIT
PACK | Freq: Once | Status: DC
  Filled 2014-08-19 (×2): qty 1

## 2014-08-19 MED ORDER — WARFARIN SODIUM 5 MG PO TABS: 2.5000 mg | ORAL_TABLET | Freq: Every day | ORAL | Status: DC

## 2014-08-19 MED ORDER — METOPROLOL TARTRATE 25 MG PO TABS: 25.0000 mg | ORAL_TABLET | Freq: Two times a day (BID) | ORAL | Status: DC

## 2014-08-19 NOTE — Progress Notes (Addendum)
D/C'd tele; D/C'd IV; D/C'd pt.  Explained discharge instructions to pt, and she had no further questions at this time.    Pt has also been instructed to obtain her [name] at the [facility], as it has been ordered and is waiting for pickup at this location.  Addtl, she does have [name] ordered at her usual home pharmacy.  Pt in no acute distress.

## 2014-08-19 NOTE — Progress Notes (Signed)
Patient Name: [name] Date of Encounter: 08/19/2014    Principal Problem:   Acute on chronic diastolic CHF (congestive heart failure), NYHA class 4 Active Problems:   HTN (hypertension)   Coronary artery disease   Aortic valvular stenosis, moderate to severe   Hyperlipidemia   PAF (paroxysmal atrial fibrillation)   Rash   Intermittent LBBB   SUBJECTIVE  Ambulated yesterday without difficulty.  No further afib.  Eager to go home.  CURRENT MEDS . amiodarone  400 mg Oral Daily  . aspirin EC  81 mg Oral Daily  . atorvastatin  40 mg Oral q1800  . enoxaparin (LOVENOX) injection  60 mg Subcutaneous Q12H  . ethacrynic acid  50 mg Oral BID  . ezetimibe  10 mg Oral Daily  . folic acid  2 mg Oral Daily  . levothyroxine  88 mcg Oral QAC breakfast  . loratadine  10 mg Oral Daily  . metoprolol tartrate  25 mg Oral BID  . multivitamin with minerals  1 tablet Oral Daily  . potassium chloride SA  10 mEq Oral Daily  . sodium chloride  3 mL Intravenous Q12H  . triamcinolone cream  1 application Topical BID  . Warfarin - Pharmacist Dosing Inpatient   Does not apply q1800   OBJECTIVE  Filed Vitals:   08/18/14 0949 08/18/14 1409 08/18/14 2150 08/19/14 0622  BP: 126/50 131/51 128/49 140/66  Pulse: 57 57 57 55  Temp: 97.6 F (36.4 C) 97.7 F (36.5 C) 98 F (36.7 C) 98.2 F (36.8 C)  TempSrc: Oral Oral Oral Oral  Resp: 20 20 20 20   Height:      Weight:    139 lb 4.8 oz (63.186 kg)  SpO2: 98% 98% 96% 95%    Intake/Output Summary (Last 24 hours) at 08/19/14 0758 Last data filed at 08/19/14 0622  Gross per 24 hour  Intake   1688 ml  Output   1725 ml  Net    -37 ml   Filed Weights   08/17/14 0458 08/18/14 0458 08/19/14 0622  Weight: 140 lb 3.2 oz (63.594 kg) 140 lb (63.504 kg) 139 lb 4.8 oz (63.186 kg)    PHYSICAL EXAM  General: Pleasant, NAD. Neuro: Alert and oriented X 3. Moves all extremities spontaneously. Psych: Normal affect. HEENT:  Normal  Neck: Supple  without bruits or JVD. Lungs:  Resp regular and unlabored, CTA. Heart: RRR no s3, s4, 3/6 SEM RUSB. Abdomen: Soft, non-tender, non-distended, BS + x 4.  Extremities: No clubbing, cyanosis or edema. DP/PT/Radials 2+ and equal bilaterally.  Accessory Clinical Findings  CBC  Recent Labs  08/18/14 0510 08/19/14 0558  WBC 8.3 7.7  HGB 11.3* 11.2*  HCT 35.4* 35.0*  MCV 97.3 96.7  PLT 247 621   Basic Metabolic Panel  Recent Labs  08/17/14 0353 08/18/14 0510  NA 141 141  K 3.8 3.8  CL 107 106  CO2 28 27  GLUCOSE 102* 91  BUN 22 23  CREATININE 1.22* 1.04  CALCIUM 8.6 8.8   Lab Results  Component Value Date   INR 1.78* 08/19/2014   INR 1.42 08/18/2014   INR 1.19 08/17/2014    TELE  rsr  Radiology/Studies  Dg Chest Portable 1 View  08/14/2014   CLINICAL DATA:  Dyspnea for 1 hour.  Cardioversion 3 days ago  EXAM: PORTABLE CHEST - 1 VIEW  COMPARISON:  01/02/2013  FINDINGS: There is mild unchanged cardiomegaly. There is no airspace consolidation. There is no large effusion.  There is apical pleural thickening which is unchanged. There is mild vascular and interstitial prominence which is worsened.  IMPRESSION: Mild congestive heart failure.   Electronically Signed   By: Andreas Newport M.D.   On: 08/14/2014 06:43    ASSESSMENT AND PLAN  1. Acute on chronic diastolic chf: Minus 3.1 L for admission.  Wt down to 139 lbs (from 143).  Feels well w/o dyspnea/orthopnea.  Tolerating edecrin well and rash is resolving - now off of torsemide for ~ 10 days.  Maintaining sinus over past 24 hrs with stable rates and BP's.  F/U creat this AM (Pending).  Cont current dose of edecrin.  Plan d/c to home today.  2. PAF: Asymptomatic. She had been having runs of afib, intermittent tachycardia with lbbb/aberancy, and also NSVT.  Quiescent in past 24 hrs and maintaining sinus.  Cont amio @ 400 daily and lopressor 25 bid.  INR coming up - now 1.78.  S/p DCCV last week.  In setting of PAF  since DCCV is there still a need to bridge (no h/o CVA)?  Will d/w Dr. Burt Knack.  3. CAD: No chest pain. One troponin was nl. ECG w/o ischemic changes but she does have intermittent tachycardia with lbbb. She does have a h/o PCI of RCA in 2010. Cont statin, bb.  Will d/c asa in setting of advanced age and ongoing coumadin (spoke with Dr. Sallyanne Kuster who agreed).  4. HTN: Stable.   5. HL: cont statin. LDL 74 in 01/2014. LFT's wnl this admission.  7. Rash: Improving. Presumed to be 2/2 diuretics in setting of sulfa allergy. She has been off of Torsemide for 10 days now. She sees derm as an outpt.  Signed, Murray Hodgkins NP  Patient seen, examined. Available data reviewed. Agree with findings, assessment, and plan as outlined by Ignacia Bayley, NP. The patient is independently interviewed and examined. Her lung fields are clear. There is no peripheral edema. I agree with the findings as documented above. The patient will be discharged as soon as ethacrynic acid arrives at the outpatient pharmacy. This medication as documented is very difficult to get. Regarding her paroxysmal atrial fibrillation, the patient's INR is now trending up. It is not practical for her to do Lovenox at home for multiple reasons. I would favor sending her home on warfarin alone. The patient will follow-up with Dr. Sallyanne Kuster an outpatient and we will arrange discharge later today. Agree with discontinuation of aspirin.  Sherren Mocha, M.D. 08/19/2014 11:03 AM

## 2014-08-19 NOTE — Discharge Summary (Signed)
Discharge Summary   Patient ID: [name],  MRN: [id number], DOB/AGE: Aug 29, 1923 81 y.o.  Admit date: 08/14/2014 Discharge date: 08/19/2014  Primary Care Provider: Wyatt Haste Primary Cardiologist: Jerilynn Mages. Croitoru, MD   Discharge Diagnoses Principal Problem:   Acute on chronic diastolic CHF (congestive heart failure), NYHA class 4  **Net negative diuresis of 3.1 L this admission with reduction in weight from 143 lbs on admission to 139 lbs at discharge.  Active Problems:   HTN (hypertension)   Coronary artery disease  **S/P prior RCA stenting.   Aortic valvular stenosis, moderate to severe   Hyperlipidemia   PAF (paroxysmal atrial fibrillation)  **On amio 400 mg daily.  **On coumadin.   Rash  **Improving off of lasix/torsemide.   Intermittent rate related LBBB  Allergies Allergies  Allergen Reactions  . Furosemide Rash  . Sulfa Antibiotics Itching and Rash  . Torsemide Rash    Procedures  None  History of Present Illness  81 y/o female with the above complex problem list. She has a h/o CAD and is s/p BMS to the RCA in 2010. She also has a h/o chronic diast chf, PAF s/p multiple cardioversions, and moderate Ao stenosis. About 4 months ago, she developed a truncal rash. She was seen by dermatology and advised that this was more than likely a drug rash. Given her h/o sulfa allergy and long term lasix usage, decision was made to d/c lasix about a month ago. She was treated with a steroid taper with improvement in rash and was then placed on torsemide QOD. The rash returned.  She has also been having issues with PAF. She is on long term amio therapy. She had recurrent afib in January and was initially set up for dccv but then converted spontaneously @ home. Last week, she developed recurrent afib and was seen urgently on 3/15. She was then set up for DCCV, which took place on 3/16 and was successful. In an effort to help her maintain sinus, her amio was  increased from 200 to 400 mg daily. Due to ongoing rash, she says that she was advised to d/c her torsemide when she presented for DCCV last Wednesday 3/16.  Unfortunately, on the night of 3/19 into 3/20, she awoke suddenly with dyspnea and orthopnea associated with wheezing.  She also noted lower extremity edema.  Her husband took her to the [facility] [place] where she was in sinus rhythm with 1st deg avb and intermittent LBBB. Troponin was nl. BNP was mildly elevated @ 208.2. CXR showed mild CHF. She was given a nebulizer treatment with some improvement in dyspnea. [facility] was made to [facility] eval given diuretic allergies.   Hospital Course  Following arrival, pt was placed on oral edecrin therapy.  Response to diuretic therapy was initially slow and her dose was titrated to 50 mg BID.  With this, she has had good urine output and symptomatic improvement with a net negative diuresis of 3.1 L and 4 lbs weight loss.  We have worked with case management and the [facility] outpatient pharmacy in order to fill an outpatient prescription for her edecrin dose.  Though she presented in sinus rhythm, [name] has had intermittent, rate-controlled afib during hospitalization.  She has also been noted to have periods of tachycardia with associated LBBB/aberancy.  She has been maintained on amiodarone 400 mg daily and her metoprolol dose has been titrated to 25 mg BID.  Her rhythm has been stable in sinus over the  past 24 hrs and she is felt to be stable for discharge today once the pharmacy receives shipment of her edecrin.  Her INR has been subtherapeutic and up to this point, she has been receiving lovenox as well.  INR is now 1.78.  As she has had paroxysms of afib since her dccv and as ongoing outpt lovenox bridging is cost prohibitive for her, we have decided not to bridge her at discharge.  [name] will be discharged home today in good condition.  We have arranged for early f/u of INR on  Monday 3/28 and early office f/u next Thursday, at which time we will repeat a BMET.  Discharge Vitals Blood pressure 154/62, pulse 58, temperature 98.2 F (36.8 C), temperature source Oral, resp. rate 20, height 5\' 3"  (1.6 m), weight 139 lb 4.8 oz (63.186 kg), SpO2 95 %.  Filed Weights   08/17/14 0458 08/18/14 0458 08/19/14 0622  Weight: 140 lb 3.2 oz (63.594 kg) 140 lb (63.504 kg) 139 lb 4.8 oz (63.186 kg)    Labs  CBC  Recent Labs  08/18/14 0510 08/19/14 0558  WBC 8.3 7.7  HGB 11.3* 11.2*  HCT 35.4* 35.0*  MCV 97.3 96.7  PLT 247 494   Basic Metabolic Panel  Recent Labs  08/17/14 0353 08/18/14 0510  NA 141 141  K 3.8 3.8  CL 107 106  CO2 28 27  GLUCOSE 102* 91  BUN 22 23  CREATININE 1.22* 1.04  CALCIUM 8.6 8.8   Liver Function Tests Lab Results  Component Value Date   ALT 16 08/15/2014   AST 21 08/15/2014   ALKPHOS 65 08/15/2014   BILITOT 1.0 08/15/2014    Cardiac Enzymes Lab Results  Component Value Date   TROPONINI <0.03 08/14/2014    Thyroid Function Tests Lab Results  Component Value Date   TSH 0.967 08/09/2014    Disposition  Pt is being discharged home today in good condition.  Follow-up Plans & Appointments      Follow-up Information    Follow up with CROITORU,MIHAI, MD On 10/06/2014.   Specialty:  Cardiology   Why:  10:45 AM   Contact information:   [address] [place] [address] [phone]       Follow up with Tarri Fuller, PA-C On 08/25/2014.   Specialty:  Physician Assistant   Why:  11:00 PM - Dr. Victorino December PA - please note office address.   Contact information:   [place] [address] [phone]       Follow up with Wyatt Haste, MD.   Specialty:  Family Medicine   Why:  as scheduled.   Contact information:   [place] [address] [phone]       Follow up with [place].   Specialty:  Cardiology   Why:  10:20 -  Coumadin clinic @ [facility] information:   [address] [place] [phone]     Discharge Medications    Medication List    STOP taking these medications        aspirin EC 81 MG tablet     trolamine salicylate 10 % cream  Commonly known as:  ASPERCREME      TAKE these medications        acetaminophen 650 MG CR tablet  Commonly known as:  TYLENOL  Take 650 mg by mouth every 8 (eight) hours.     amiodarone  200 MG tablet  Commonly known as:  PACERONE  Take 2 tablets (400 mg total) by mouth daily.     atorvastatin 40 MG tablet  Commonly known as:  LIPITOR  Take 1 tablet (40 mg total) by mouth daily.     CENTRUM SILVER tablet  Take 1 tablet by mouth daily.     ethacrynic acid 25 MG tablet  Commonly known as:  [name]  Take 2 tablets (50 mg total) by mouth 2 (two) times daily.     ezetimibe 10 MG tablet  Commonly known as:  ZETIA  Take 1 tablet (10 mg total) by mouth daily.     folic acid 1 MG tablet  Commonly known as:  FOLVITE  Take 2 tablets (2 mg total) by mouth daily.     levothyroxine 88 MCG tablet  Commonly known as:  SYNTHROID, LEVOTHROID  Take 1 tablet (88 mcg total) by mouth daily.     loratadine 10 MG tablet  Commonly known as:  CLARITIN  Take 10 mg by mouth daily.     metoprolol tartrate 25 MG tablet  Commonly known as:  LOPRESSOR  Take 1 tablet (25 mg total) by mouth 2 (two) times daily.     potassium chloride SA 20 MEQ tablet  Commonly known as:  K-DUR,KLOR-CON  TAKE ONE-HALF (1/2) TABLET DAILY     TEARS PLUS OP  Place 1 drop into both eyes daily.     triamcinolone cream 0.1 %  Commonly known as:  KENALOG  Apply 1 application topically 2 (two) times daily.     warfarin 5 MG tablet  Commonly known as:  COUMADIN  Take 0.5 tablets (2.5 mg total) by mouth daily at 6 PM.       Outstanding Labs/Studies  F/U BMET at oupt office visit.  Duration of Discharge Encounter    Greater than 30 minutes including physician time.  Signed, Murray Hodgkins NP 08/19/2014, 11:28 AM

## 2014-08-19 NOTE — Telephone Encounter (Signed)
TOC pt per Milly Jakob 08-25-14 with Gaspar Bidding.

## 2014-08-22 ENCOUNTER — Other Ambulatory Visit: Payer: Self-pay | Admitting: *Deleted

## 2014-08-22 ENCOUNTER — Ambulatory Visit (INDEPENDENT_AMBULATORY_CARE_PROVIDER_SITE_OTHER): Payer: Medicare Other | Admitting: *Deleted

## 2014-08-22 DIAGNOSIS — I480 Paroxysmal atrial fibrillation: Secondary | ICD-10-CM | POA: Diagnosis not present

## 2014-08-22 DIAGNOSIS — Z7901 Long term (current) use of anticoagulants: Secondary | ICD-10-CM

## 2014-08-22 LAB — POCT INR: INR: 4.2

## 2014-08-22 MED ORDER — WARFARIN SODIUM 5 MG PO TABS: ORAL_TABLET | ORAL | Status: DC

## 2014-08-22 MED ORDER — LEVOTHYROXINE SODIUM 88 MCG PO TABS: 88.0000 ug | ORAL_TABLET | Freq: Every day | ORAL | Status: DC

## 2014-08-22 MED ORDER — EZETIMIBE 10 MG PO TABS: 10.0000 mg | ORAL_TABLET | Freq: Every day | ORAL | Status: DC

## 2014-08-22 MED ORDER — FOLIC ACID 1 MG PO TABS: 2.0000 mg | ORAL_TABLET | Freq: Every day | ORAL | Status: DC

## 2014-08-22 NOTE — Telephone Encounter (Signed)
Patient contacted regarding discharge from [facility] on 08/19/14.  Patient understands to follow up with provider Tarri Fuller, PC-C on 08/25/14 at 11 am at [address] in [place]. Patient understands discharge instructions? yes Patient understands medications and regiment? yes Patient understands to bring all medications to this visit? yes  The pt c/o knee pain that she is treating with Ephraim Hamburger. She is advised to contact her PCP concerning her knee pain if not better soon. She verbalized understanding.

## 2014-08-22 NOTE — Telephone Encounter (Signed)
Rx(s) sent to pharmacy electronically.  

## 2014-08-25 ENCOUNTER — Encounter: Payer: Self-pay | Admitting: Physician Assistant

## 2014-08-25 ENCOUNTER — Ambulatory Visit (INDEPENDENT_AMBULATORY_CARE_PROVIDER_SITE_OTHER): Payer: Medicare Other | Admitting: Physician Assistant

## 2014-08-25 VITALS — BP 146/66 | HR 57 | Ht 63.0 in | Wt 141.0 lb

## 2014-08-25 DIAGNOSIS — I2510 Atherosclerotic heart disease of native coronary artery without angina pectoris: Secondary | ICD-10-CM

## 2014-08-25 DIAGNOSIS — E785 Hyperlipidemia, unspecified: Secondary | ICD-10-CM

## 2014-08-25 DIAGNOSIS — Z7901 Long term (current) use of anticoagulants: Secondary | ICD-10-CM

## 2014-08-25 DIAGNOSIS — I5032 Chronic diastolic (congestive) heart failure: Secondary | ICD-10-CM | POA: Diagnosis not present

## 2014-08-25 DIAGNOSIS — I5033 Acute on chronic diastolic (congestive) heart failure: Secondary | ICD-10-CM

## 2014-08-25 DIAGNOSIS — I2583 Coronary atherosclerosis due to lipid rich plaque: Secondary | ICD-10-CM

## 2014-08-25 DIAGNOSIS — I10 Essential (primary) hypertension: Secondary | ICD-10-CM

## 2014-08-25 DIAGNOSIS — R21 Rash and other nonspecific skin eruption: Secondary | ICD-10-CM

## 2014-08-25 DIAGNOSIS — I480 Paroxysmal atrial fibrillation: Secondary | ICD-10-CM | POA: Diagnosis not present

## 2014-08-25 LAB — BASIC METABOLIC PANEL
BUN: 27 mg/dL — ABNORMAL HIGH (ref 6–23)
CALCIUM: 9.4 mg/dL (ref 8.4–10.5)
CO2: 28 mEq/L (ref 19–32)
Chloride: 105 mEq/L (ref 96–112)
Creatinine, Ser: 1.23 mg/dL — ABNORMAL HIGH (ref 0.40–1.20)
GFR: 43.71 mL/min — ABNORMAL LOW (ref >60.00)
GLUCOSE: 81 mg/dL (ref 70–99)
Potassium: 4.1 mEq/L (ref 3.5–5.1)
Sodium: 141 mEq/L (ref 135–145)

## 2014-08-25 NOTE — Assessment & Plan Note (Signed)
improving. Thought to be related to sulfa.

## 2014-08-25 NOTE — Assessment & Plan Note (Signed)
Complaints of angina. On beta blocker and statin

## 2014-08-25 NOTE — Assessment & Plan Note (Signed)
Continue statin. 

## 2014-08-25 NOTE — Patient Instructions (Signed)
Your physician recommends that you continue on your current medications as directed. Please refer to the Current Medication list given to you today.    LABS BMET

## 2014-08-25 NOTE — Assessment & Plan Note (Signed)
A she is maintaining sinus bradycardia with a rate of 57 as per minute. She is a first-degree AV block.. Continue current amiodarone dosing and consider titrating down to 200 mg daily future. She was on this dose when she went back into atrial fibrillation.

## 2014-08-25 NOTE — Assessment & Plan Note (Signed)
She appears euvolemic. She reports her weight is stable. Continue [name].

## 2014-08-25 NOTE — Progress Notes (Signed)
Patient ID: [name], female   DOB: 09/29/1923, 81 y.o.   MRN: [id number]    Date:  08/25/2014   ID:  [name], DOB 1923/06/14, MRN [id number]  PCP:  Wyatt Haste, MD  Primary Cardiologist:  croitoru   Chief Complaint  Patient presents with  . Coronary Artery Disease  . post hospital     History of Present Illness: [name] is a 81 y.o. female  81 y/o female with the above complex problem list. She has a h/o CAD and is s/p BMS to the RCA in 2010. She also has a h/o chronic diast chf, PAF s/p multiple cardioversions, and moderate Ao stenosis. About 4 months ago, she developed a truncal rash. She was seen by dermatology and advised that this was more than likely a drug rash. Given her h/o sulfa allergy and long term lasix usage, decision was made to d/c lasix about a month ago. She was treated with a steroid taper with improvement in rash and was then placed on torsemide QOD. The rash returned.  She has also been having issues with PAF. She is on long term amio therapy. She had recurrent afib in January and was initially set up for dccv but then converted spontaneously @ home. Last week, she developed recurrent afib and was seen urgently on 3/15. She was then set up for DCCV, which took place on 3/16 and was successful. In an effort to help her maintain sinus, her amio was increased from 200 to 400 mg daily. Due to ongoing rash, she says that she was advised to d/c her torsemide when she presented for DCCV last Wednesday 3/16. Unfortunately, on the night of 3/19 into 3/20, she awoke suddenly with dyspnea and orthopnea associated with wheezing. She also noted lower extremity edema. Her husband took her to the [facility] [place] where she was in sinus rhythm with 1st deg avb and intermittent LBBB. Troponin was nl. BNP was mildly elevated @ 208.2. CXR showed mild CHF. She was given a nebulizer treatment with some improvement in dyspnea. [facility] given diuretic allergies.   Pt was placed on oral edecrin therapy. Response to diuretic therapy was initially slow and her dose was titrated to 50 mg BID. With this, she has had good urine output and symptomatic improvement with a net negative diuresis of 3.1 L and 4 lbs weight loss. [name] has had intermittent, rate-controlled afib during hospitalization. She has also been noted to have periods of tachycardia with associated LBBB/aberancy. She has been maintained on amiodarone 400 mg daily and her metoprolol dose has been titrated to 25 mg BID.  She presents today for posthospital follow-up for. She reports doing well. Her rash, which is caused from sulfa drugs is improving.  She reports her weight is been stable.  She currently denies nausea, vomiting, fever, chest pain, shortness of breath, orthopnea, dizziness, PND, cough, congestion, abdominal pain, hematochezia, melena, lower extremity edema, claudication.  Wt Readings from Last 3 Encounters:  08/25/14 141 lb (63.957 kg)  08/19/14 139 lb 4.8 oz (63.186 kg)  08/09/14 144 lb (65.318 kg)     Past Medical History  Diagnosis Date  . Chronic diastolic CHF (congestive heart failure)     a. 10/2012 Echo: EF 55-60%, no rwma, Gr 2 DD, moderate AS, mod dil LA, mildly to mod dil RA.  Marland Kitchen Hypothyroidism   . Dyslipidemia   . Hypertension   . PAF (paroxysmal  atrial fibrillation)     a. CHA2DS2VASc = 7-->chronic coumadin;  b. s/p DCCV 05/2011, 05/2012, 07/2014;  c. On amio.  Marland Kitchen  Acute respiratiory failure requiring BiPap 06/24/2011  . Aortic valvular stenosis, moderate to severe 06/24/2011    a. 10/2012 Echo: mod AS, Valve 1.25 cm^2 (VTI), 1.19cm^2 (Vmax).  . Allergy   . Coronary artery disease     a. 03/2009 PCI RCA (3.0x18 BMS).  . Exertional shortness of breath   . Arthritis     "back" (10/30/2012)  . Rash     a. felt to be 2/2 lasix/torsemide in setting of sulfa allergy (lasix d/c ~ 06/2014, torsemide d/c 08/09/2014).     Current Outpatient Prescriptions  Medication Sig Dispense Refill  . acetaminophen (TYLENOL) 650 MG CR tablet Take 650 mg by mouth every 8 (eight) hours.     Marland Kitchen amiodarone (PACERONE) 200 MG tablet Take 2 tablets (400 mg total) by mouth daily. 90 tablet 2  . atorvastatin (LIPITOR) 40 MG tablet Take 1 tablet (40 mg total) by mouth daily. 90 tablet 1  . ethacrynic acid ([name]) 25 MG tablet Take 2 tablets (50 mg total) by mouth 2 (two) times daily. 360 tablet 3  . ezetimibe (ZETIA) 10 MG tablet Take 1 tablet (10 mg total) by mouth daily. 90 tablet 1  . folic acid (FOLVITE) 1 MG tablet Take 2 tablets (2 mg total) by mouth daily. 180 tablet 1  . levothyroxine (SYNTHROID, LEVOTHROID) 88 MCG tablet Take 1 tablet (88 mcg total) by mouth daily. 90 tablet 1  . loratadine (CLARITIN) 10 MG tablet Take 10 mg by mouth daily.    . metoprolol tartrate (LOPRESSOR) 25 MG tablet Take 1 tablet (25 mg total) by mouth 2 (two) times daily. 60 tablet 6  . Multiple Vitamins-Minerals (CENTRUM SILVER) tablet Take 1 tablet by mouth daily.      . Polyvinyl Alcohol-Povidone (TEARS PLUS OP) Place 1 drop into both eyes daily.    . potassium chloride SA (K-DUR,KLOR-CON) 20 MEQ tablet TAKE ONE-HALF (1/2) TABLET DAILY 45 tablet 2  . triamcinolone cream (KENALOG) 0.1 % Apply 1 application topically 2 (two) times daily.     Marland Kitchen warfarin (COUMADIN) 5 MG tablet Take 1/4 - 1/2 tablet by mouth day as directed by coumadin clinic 90 tablet 1   No current facility-administered medications for this visit.    Allergies:    Allergies  Allergen Reactions  . Furosemide Rash  . Sulfa Antibiotics Itching and Rash  . Torsemide Rash    Social History:  The patient  reports that she has never smoked. She has never used smokeless tobacco. She reports that she does not drink alcohol or use illicit drugs.   Family history:   Family History  Problem Relation Age of Onset  . Heart disease Brother   . Heart disease Brother   . Lung  disease Brother   . Heart disease Mother 65  . Cancer Mother 64    Uterine  . Stroke Mother 32  . Heart disease Father   . Hypertension Child 34  . Alcohol abuse Child   . Heart attack Father   . Heart failure Mother   . Cancer Son   . Hypertension Mother   . Hypertension Father   . Hypertension Sister   . Hypertension Brother     ROS:  Please see the history of present illness.  All other systems reviewed and negative.   PHYSICAL EXAM: VS:  BP 146/66 mmHg  Pulse  57  Ht 5\' 3"  (1.6 m)  Wt 141 lb (63.957 kg)  BMI 24.98 kg/m2 Well nourished, well developed, in no acute distress HEENT: Pupils are equal round react to light accommodation extraocular movements are intact.  Neck: no JVDNo cervical lymphadenopathy. Cardiac: Regular rate and rhythm with systolic murmur. No rales or wheezing Lungs:  clear to auscultation bilaterally, no wheezing, rhonchi or rales Abd: soft, nontender, positive bowel sounds all quadrants, no hepatosplenomegaly Ext: Trace lower extremity edema.  2+ radial and dorsalis pedis pulses. Skin: warm and dry Neuro:  Grossly normal  EKG:  Sinus bradycardia with first-degree AV block rate 57 bpm   ASSESSMENT AND PLAN:  Problem List Items Addressed This Visit    Rash    improving. Thought to be related to sulfa.      Paroxysmal atrial fibrillation, recurrent    A she is maintaining sinus bradycardia with a rate of 57 as per minute. She is a first-degree AV block.. Continue current amiodarone dosing and consider titrating down to 200 mg daily future. She was on this dose when she went back into atrial fibrillation.      Long term current use of anticoagulant therapy   Hyperlipidemia    Continue statin.      HTN (hypertension)    A pressure mildly elevated. Patient reports her blood pressure at home usually is around 122/60.  No changes to current therapy.      Coronary artery disease    Complaints of angina. On beta blocker and statin       Chronic diastolic heart failure    She appears euvolemic. She reports her weight is stable. Continue [name]. Basic metabolic panel today      Acute on chronic diastolic CHF (congestive heart failure), NYHA class 4 - Primary   Relevant Orders   EKG 54-OEVO   Basic Metabolic Panel (BMET)

## 2014-08-25 NOTE — Assessment & Plan Note (Signed)
A pressure mildly elevated. Patient reports her blood pressure at home usually is around 122/60.  No changes to current therapy.

## 2014-08-30 ENCOUNTER — Telehealth: Payer: Self-pay | Admitting: Cardiovascular Disease

## 2014-08-30 MED ORDER — WARFARIN SODIUM 5 MG PO TABS: ORAL_TABLET | ORAL | Status: DC

## 2014-08-30 NOTE — Telephone Encounter (Signed)
Returned call to pharmacist at [facility].She stated it is not recommended to take 1/4 tablet of coumadin.Stated due to accuracy of dose.Advised will send message to our pharmacist for new directions.Message sent to [facility] for advice.

## 2014-08-30 NOTE — Telephone Encounter (Signed)
The pharmacist is calling to clarify dosage on her [place] . Its the way the prescription is written that the pharmacist would like to discuss.. [id number] use reference #[id number].. Thanks

## 2014-08-31 ENCOUNTER — Ambulatory Visit (INDEPENDENT_AMBULATORY_CARE_PROVIDER_SITE_OTHER): Payer: Medicare Other | Admitting: Pharmacist Clinician (PhC)/ Clinical Pharmacy Specialist

## 2014-08-31 DIAGNOSIS — I480 Paroxysmal atrial fibrillation: Secondary | ICD-10-CM | POA: Diagnosis not present

## 2014-08-31 DIAGNOSIS — Z7901 Long term (current) use of anticoagulants: Secondary | ICD-10-CM | POA: Diagnosis not present

## 2014-08-31 LAB — POCT INR: INR: 3.3

## 2014-09-08 ENCOUNTER — Ambulatory Visit
Admission: RE | Admit: 2014-09-08 | Discharge: 2014-09-08 | Disposition: A | Discharge status: Home / Self Care | Payer: Medicare Other | Source: Ambulatory Visit | Attending: Family Medicine | Admitting: Family Medicine

## 2014-09-08 ENCOUNTER — Ambulatory Visit (INDEPENDENT_AMBULATORY_CARE_PROVIDER_SITE_OTHER): Payer: Medicare Other | Admitting: Family Medicine

## 2014-09-08 ENCOUNTER — Encounter: Payer: Self-pay | Admitting: Family Medicine

## 2014-09-08 VITALS — BP 118/70 | HR 62 | Wt 143.0 lb

## 2014-09-08 DIAGNOSIS — I5032 Chronic diastolic (congestive) heart failure: Secondary | ICD-10-CM

## 2014-09-08 DIAGNOSIS — M25562 Pain in left knee: Secondary | ICD-10-CM

## 2014-09-08 DIAGNOSIS — R21 Rash and other nonspecific skin eruption: Secondary | ICD-10-CM | POA: Diagnosis not present

## 2014-09-08 NOTE — Progress Notes (Signed)
   Subjective:    Patient ID: [name], female    DOB: 08-16-1923, 81 y.o.   MRN: [id number]  HPI She is here for follow-up visit after recent hospitalization. Her history is very complicated and revolves around having a rash that was probably sulfa allergy and subsequent change in her medications causing some cardiac difficulty with congestive failure etc. He has been following up with cardiology. While in the hospital she also developed left knee pain. There is no history of injury to this. She has had no swelling or redness. No rash present on the knee.   Review of Systems     Objective:   Physical Exam Alert and in no distress. Exam of her left knee does show a very small effusion. She does have tenderness palpation over the medial and lateral joint lines. Slight crepitus with motion of the knee. No laxity is noted.       Assessment & Plan:  Left knee pain - Plan: DG Knee Complete 4 Views Left, CANCELED: DG Knee 1-2 Views Left  Rash  Chronic diastolic heart failure will continue to be followed by cardiology. X-rays were ordered. I suspect arthritis and if such I will probably push towards an injection rather than NSAID's.

## 2014-09-13 ENCOUNTER — Ambulatory Visit (INDEPENDENT_AMBULATORY_CARE_PROVIDER_SITE_OTHER): Payer: Medicare Other | Admitting: Family Medicine

## 2014-09-13 ENCOUNTER — Encounter: Payer: Self-pay | Admitting: Family Medicine

## 2014-09-13 VITALS — BP 118/64 | HR 74 | Wt 141.8 lb

## 2014-09-13 DIAGNOSIS — M25562 Pain in left knee: Secondary | ICD-10-CM | POA: Diagnosis not present

## 2014-09-13 DIAGNOSIS — M1990 Unspecified osteoarthritis, unspecified site: Secondary | ICD-10-CM

## 2014-09-13 MED ORDER — LIDOCAINE HCL 2 % IJ SOLN
1.0000 mL | Freq: Once | INTRAMUSCULAR | Status: AC
  Administered 2014-09-13: 20 mg

## 2014-09-13 MED ORDER — TRIAMCINOLONE ACETONIDE 40 MG/ML IJ SUSP
40.0000 mg | Freq: Once | INTRAMUSCULAR | Status: AC
  Administered 2014-09-13: 40 mg via INTRAMUSCULAR

## 2014-09-13 NOTE — Progress Notes (Signed)
   Subjective:    Patient ID: [name], female    DOB: [birth year], 81 y.o.   MRN: [id number]  HPI She is here for recheck. Recent x-rays did show evidence of degenerative joint disease. She continues to complain of pain almost constantly.would like an injection.  Review of Systems     Objective:   Physical Exam Alert and in no distress. The left ear was prepped laterally with Betadine. Joint was easily located.       Assessment & Plan:  Left knee pain - Plan: lidocaine (XYLOCAINE) 2 % (with pres) injection 20 mg, triamcinolone acetonide (KENALOG-40) injection 40 mg  Arthritis the knee was injected with 40 mg of Kenalog and 3 mL of Xylocaine without difficulty. She tolerated the procedure well and did obtain relief of her symptoms. Discussedhow long this might possibly last. Explained that we would not know but the shorter the timeframe the more likely a referral would be needed.

## 2014-09-15 ENCOUNTER — Ambulatory Visit (INDEPENDENT_AMBULATORY_CARE_PROVIDER_SITE_OTHER): Payer: Medicare Other | Admitting: Pharmacist Clinician (PhC)/ Clinical Pharmacy Specialist

## 2014-09-15 DIAGNOSIS — Z7901 Long term (current) use of anticoagulants: Secondary | ICD-10-CM

## 2014-09-15 DIAGNOSIS — I480 Paroxysmal atrial fibrillation: Secondary | ICD-10-CM | POA: Diagnosis not present

## 2014-09-15 LAB — POCT INR: INR: 2.3

## 2014-10-06 ENCOUNTER — Ambulatory Visit (INDEPENDENT_AMBULATORY_CARE_PROVIDER_SITE_OTHER): Payer: Medicare Other | Admitting: Pharmacist Clinician (PhC)/ Clinical Pharmacy Specialist

## 2014-10-06 ENCOUNTER — Ambulatory Visit (INDEPENDENT_AMBULATORY_CARE_PROVIDER_SITE_OTHER): Payer: Medicare Other | Admitting: Cardiovascular Disease

## 2014-10-06 ENCOUNTER — Encounter: Payer: Self-pay | Admitting: Cardiovascular Disease

## 2014-10-06 VITALS — BP 124/72 | HR 63 | Resp 16 | Ht 63.5 in | Wt 142.7 lb

## 2014-10-06 DIAGNOSIS — Z7901 Long term (current) use of anticoagulants: Secondary | ICD-10-CM

## 2014-10-06 DIAGNOSIS — I2583 Coronary atherosclerosis due to lipid rich plaque: Secondary | ICD-10-CM

## 2014-10-06 DIAGNOSIS — I10 Essential (primary) hypertension: Secondary | ICD-10-CM

## 2014-10-06 DIAGNOSIS — I350 Nonrheumatic aortic (valve) stenosis: Secondary | ICD-10-CM | POA: Diagnosis not present

## 2014-10-06 DIAGNOSIS — I480 Paroxysmal atrial fibrillation: Secondary | ICD-10-CM

## 2014-10-06 DIAGNOSIS — I2510 Atherosclerotic heart disease of native coronary artery without angina pectoris: Secondary | ICD-10-CM | POA: Diagnosis not present

## 2014-10-06 DIAGNOSIS — I481 Persistent atrial fibrillation: Secondary | ICD-10-CM

## 2014-10-06 DIAGNOSIS — I5032 Chronic diastolic (congestive) heart failure: Secondary | ICD-10-CM | POA: Diagnosis not present

## 2014-10-06 DIAGNOSIS — I4819 Other persistent atrial fibrillation: Secondary | ICD-10-CM

## 2014-10-06 LAB — POCT INR: INR: 3.2

## 2014-10-06 NOTE — Patient Instructions (Signed)
STOP Amiodarone.  Dr. Sallyanne Kuster recommends that you schedule a follow-up appointment in: 8-10 weeks.

## 2014-10-06 NOTE — Progress Notes (Signed)
Patient ID: [name], female   DOB: 03-01-1924, 81 y.o.   MRN: [id number]     Cardiology Office Note   Date:  10/07/2014   ID:  JLEE HARKLESS, DOB 1924-02-21, MRN [id number]  PCP:  Wyatt Haste, MD  Cardiologist:   Sanda Klein, MD   Chief Complaint  Patient presents with  . Follow-up    2 months:  No complaints of chest pain or SOB.  Mild ankle edema and occas. lightheadedness.      History of Present Illness: [name] is a 81 y.o. female who presents for follow up after DC cardioversion for atrial fibrillation  She did not notice much of a change after the successful cardioversion. In fact, she required hospitalization for acute diastolic HF after her diuretic was stopped for a rash. She responded well to ethacrynic acid. On 3/31 she was in sinus bradycardia. he is now back in atrial fibrillation, despite a higher dose of amiodarone and has had no change in symptoms.  She has coronary artery disease and received a bare-metal stent (3x18 mm intake he) to the right coronary in 2010. She has not had angina pectoris since.  She has diastolic heart failure with a remote acute exacerbation in June 2014 and March 2016. Her echo shows an ejection fraction of 55-60% and pseudo-normal mitral inflow.  She has moderate aortic stenosis (estimated valve area 1.2 cm square, mean gradient 18 mm Hg). She has a history of recurrent paroxysmal atrial fibrillation and has required cardioversion twice before in the past (most recently January 2014).  She is on chronic warfarin anticoagulation and has not had stroke or bleeding complications. She has treated hyperlipidemia, hypertension and hypothyroidism  Past Medical History  Diagnosis Date  . Chronic diastolic CHF (congestive heart failure)     a. 10/2012 Echo: EF 55-60%, no rwma, Gr 2 DD, moderate AS, mod dil LA, mildly to mod dil RA.  Marland Kitchen Hypothyroidism   . Dyslipidemia   . Hypertension   . PAF (paroxysmal atrial  fibrillation)     a. CHA2DS2VASc = 7-->chronic coumadin;  b. s/p DCCV 05/2011, 05/2012, 07/2014;  c. On amio.  Marland Kitchen  Acute respiratiory failure requiring BiPap 06/24/2011  . Aortic valvular stenosis, moderate to severe 06/24/2011    a. 10/2012 Echo: mod AS, Valve 1.25 cm^2 (VTI), 1.19cm^2 (Vmax).  . Allergy   . Coronary artery disease     a. 03/2009 PCI RCA (3.0x18 BMS).  . Exertional shortness of breath   . Arthritis     "back" (10/30/2012)  . Rash     a. felt to be 2/2 lasix/torsemide in setting of sulfa allergy (lasix d/c ~ 06/2014, torsemide d/c 08/09/2014).    Past Surgical History  Procedure Laterality Date  . Cardioversion  06/19/2011    Procedure: CARDIOVERSION;  Surgeon: Sanda Klein, MD;  Location: [facility];  Service: Cardiovascular;  Laterality: N/A;  . Appendectomy    . Angioplasty    . Cataract extraction w/ intraocular lens  implant, bilateral    . Lumbar disc surgery      "had to go in twice and clean out scar tissue" (10/30/2012)  . Coronary angioplasty with stent placement      "1" (10/30/2012)  . Dilation and curettage of uterus    . Excisional hemorrhoidectomy    . Wrist fracture surgery Left   . Shoulder acromioplasty Left     "fell; put a new ball in" (10/30/2012)  . Cardioversion  07/11/2010    Successful  coversion to sinus rhythm  . Cardiac catheterization  10/06/2009    Continue medical therapy  . Cardiac catheterization  04/07/2009    RCA stented with a 3x45m stent resulting in a reduction of 90% narrowing to normal  . Cardioversion N/A 08/10/2014    Procedure: CARDIOVERSION;  Surgeon: MSanda Klein MD;  [facility];  Service: Cardiovascular;  Laterality: N/A;     Current Outpatient Prescriptions  Medication Sig Dispense Refill  . acetaminophen (TYLENOL) 650 MG CR tablet Take 650 mg by mouth every 8 (eight) hours.     .Marland Kitchenatorvastatin (LIPITOR) 40 MG tablet Take 1 tablet (40 mg total) by mouth daily. 90 tablet 1  . ethacrynic acid ([name]) 25 MG tablet Take  2 tablets (50 mg total) by mouth 2 (two) times daily. 360 tablet 3  . ezetimibe (ZETIA) 10 MG tablet Take 1 tablet (10 mg total) by mouth daily. 90 tablet 1  . folic acid (FOLVITE) 1 MG tablet Take 2 tablets (2 mg total) by mouth daily. 180 tablet 1  . levothyroxine (SYNTHROID, LEVOTHROID) 88 MCG tablet Take 1 tablet (88 mcg total) by mouth daily. 90 tablet 1  . loratadine (CLARITIN) 10 MG tablet Take 10 mg by mouth daily.    . metoprolol tartrate (LOPRESSOR) 25 MG tablet Take 1 tablet (25 mg total) by mouth 2 (two) times daily. 60 tablet 6  . Multiple Vitamins-Minerals (CENTRUM SILVER) tablet Take 1 tablet by mouth daily.      . Polyvinyl Alcohol-Povidone (TEARS PLUS OP) Place 1 drop into both eyes daily.    . potassium chloride SA (K-DUR,KLOR-CON) 20 MEQ tablet TAKE ONE-HALF (1/2) TABLET DAILY 45 tablet 2  . triamcinolone cream (KENALOG) 0.1 % Apply 1 application topically 2 (two) times daily.     .Marland Kitchenwarfarin (COUMADIN) 5 MG tablet Take 1/2 tablet by mouth day or as directed by coumadin clinic (Patient taking differently: Take 1/2 tablet by mouth day or as directed by coumadin clinic(Take 1/2 tablet 2 days a week and 1/4 of tab 5 days a week)) 90 tablet 1   No current facility-administered medications for this visit.    Allergies:   Furosemide; Sulfa antibiotics; and Torsemide    Social History:  The patient  reports that she has never smoked. She has never used smokeless tobacco. She reports that she does not drink alcohol or use illicit drugs.   Family History:  The patient's family history includes Alcohol abuse in her child; Cancer in her son; Cancer (age of onset: 678 in her mother; Heart attack in her father; Heart disease in her brother, brother, and father; Heart disease (age of onset: 616 in her mother; Heart failure in her mother; Hypertension in her brother, father, mother, and sister; Hypertension (age of onset: 358 in her child; Lung disease in her brother; Stroke (age of onset: 634  in her mother.    ROS:  Please see the history of present illness.    Otherwise, review of systems positive for none.   All other systems are reviewed and negative.    PHYSICAL EXAM: VS:  BP 124/72 mmHg  Pulse 63  Resp 16  Ht 5' 3.5" (1.613 m)  Wt 142 lb 11.2 oz (64.728 kg)  BMI 24.88 kg/m2 , BMI Body mass index is 24.88 kg/(m^2).  General: Alert, oriented x3, no distress Head: no evidence of trauma, PERRL, EOMI, no exophtalmos or lid lag, no myxedema, no xanthelasma; normal ears, nose and oropharynx Neck: normal jugular venous pulsations and  no hepatojugular reflux; brisk carotid pulses without delay and no carotid bruits Chest: clear to auscultation, no signs of consolidation by percussion or palpation, normal fremitus, symmetrical and full respiratory excursions Cardiovascular: normal position and quality of the apical impulse, irregular rhythm, normal first and second heart sounds, 7-6/5 sysotlic ejection murmur, rubs or gallops Abdomen: no tenderness or distention, no masses by palpation, no abnormal pulsatility or arterial bruits, normal bowel sounds, no hepatosplenomegaly Extremities: no clubbing, cyanosis or edema; 2+ radial, ulnar and brachial pulses bilaterally; 2+ right femoral, posterior tibial and dorsalis pedis pulses; 2+ left femoral, posterior tibial and dorsalis pedis pulses; no subclavian or femoral bruits Neurological: grossly nonfocal Psych: euthymic mood, full affect   EKG:  EKG is ordered today. The ekg ordered today demonstrates atrial fibrillation, NS ST changes   Recent Labs: 08/09/2014: TSH 0.967 08/14/2014: B Natriuretic Peptide 208.2* 08/15/2014: ALT 16 08/19/2014: Hemoglobin 11.2*; Platelets 254 08/25/2014: BUN 27*; Creatinine 1.23*; Potassium 4.1; Sodium 141    Lipid Panel    Component Value Date/Time   CHOL 139 01/26/2014 1210   TRIG 98 01/26/2014 1210   HDL 45 01/26/2014 1210   CHOLHDL 3.1 01/26/2014 1210   VLDL 20 01/26/2014 1210   LDLCALC 74  01/26/2014 1210      Wt Readings from Last 3 Encounters:  10/06/14 142 lb 11.2 oz (64.728 kg)  09/13/14 141 lb 12.8 oz (64.32 kg)  09/08/14 143 lb (64.864 kg)     ASSESSMENT AND PLAN:  Persistent atrial fibrillation We discussed the likelihood that atrial fibrillation will simply be harder and harder to control over time and she is tolerating it better than in the past, Rather than continue high dose amiodarone and repeat cardioversion, may be best to change to a rate control strategy. Will stop amiodarone. Anticipate need for increasing doses of warfarin and metoprolol to maintain adequate anticoagulation and rate control.  (Acute on) Chronic diastolic heart failure No clinical signs of hypervolemia today. Ethacrynic acid seems to be working well for her. Her home weight is steady at 139-142 lb. Our scale shows 1 lb heavier.  Moderate to severe aortic stenosis Most recent assessment of aortic valve area was 1.2 cm  Coronary artery disease status post remote bare-metal stent to right coronary artery Does not have angina pectoris at this time.  Hyperlipidemia Good parameters on lipid profile in September  Hypertension Good control   Current medicines are reviewed at length with the patient today.  The patient does not have concerns regarding medicines.  The following changes have been made:  Stop amiodarone  Labs/ tests ordered today include:  Orders Placed This Encounter  Procedures  . EKG 12-Lead   Patient Instructions  STOP Amiodarone.  Dr. Sallyanne Kuster recommends that you schedule a follow-up appointment in: 8-10 weeks.      Mikael Spray, MD  10/07/2014 10:12 PM    Sanda Klein, MD, [facility] [phone] office [phone] pager

## 2014-10-07 DIAGNOSIS — I4819 Other persistent atrial fibrillation: Secondary | ICD-10-CM | POA: Insufficient documentation

## 2014-10-27 ENCOUNTER — Ambulatory Visit (INDEPENDENT_AMBULATORY_CARE_PROVIDER_SITE_OTHER): Payer: Medicare Other | Admitting: Pharmacist Clinician (PhC)/ Clinical Pharmacy Specialist

## 2014-10-27 DIAGNOSIS — I480 Paroxysmal atrial fibrillation: Secondary | ICD-10-CM | POA: Diagnosis not present

## 2014-10-27 DIAGNOSIS — Z7901 Long term (current) use of anticoagulants: Secondary | ICD-10-CM | POA: Diagnosis not present

## 2014-10-27 LAB — POCT INR: INR: 2

## 2014-11-03 ENCOUNTER — Other Ambulatory Visit: Payer: Self-pay | Admitting: Cardiovascular Disease

## 2014-11-07 ENCOUNTER — Telehealth: Payer: Self-pay | Admitting: Cardiovascular Disease

## 2014-11-07 ENCOUNTER — Encounter: Payer: Self-pay | Admitting: Family Medicine

## 2014-11-07 ENCOUNTER — Ambulatory Visit (INDEPENDENT_AMBULATORY_CARE_PROVIDER_SITE_OTHER): Payer: Medicare Other | Admitting: Family Medicine

## 2014-11-07 VITALS — BP 124/70 | HR 86 | Temp 97.9°F | Wt 138.0 lb

## 2014-11-07 DIAGNOSIS — E038 Other specified hypothyroidism: Secondary | ICD-10-CM

## 2014-11-07 DIAGNOSIS — R1111 Vomiting without nausea: Secondary | ICD-10-CM | POA: Diagnosis not present

## 2014-11-07 LAB — CBC WITH DIFFERENTIAL/PLATELET
BASOS ABS: 0 10*3/uL (ref 0.0–0.1)
Basophils Relative: 0 % (ref 0–1)
EOS PCT: 1 % (ref 0–5)
Eosinophils Absolute: 0.1 10*3/uL (ref 0.0–0.7)
HCT: 41.2 % (ref 36.0–46.0)
Hemoglobin: 13.2 g/dL (ref 12.0–15.0)
LYMPHS ABS: 2 10*3/uL (ref 0.7–4.0)
LYMPHS PCT: 21 % (ref 12–46)
MCH: 29.5 pg (ref 26.0–34.0)
MCHC: 32 g/dL (ref 30.0–36.0)
MCV: 92.2 fL (ref 78.0–100.0)
MPV: 10.8 fL (ref 8.6–12.4)
Monocytes Absolute: 0.9 10*3/uL (ref 0.1–1.0)
Monocytes Relative: 10 % (ref 3–12)
Neutro Abs: 6.3 10*3/uL (ref 1.7–7.7)
Neutrophils Relative %: 68 % (ref 43–77)
Platelets: 272 10*3/uL (ref 150–400)
RBC: 4.47 MIL/uL (ref 3.87–5.11)
RDW: 14.8 % (ref 11.5–15.5)
WBC: 9.3 10*3/uL (ref 4.0–10.5)

## 2014-11-07 LAB — COMPREHENSIVE METABOLIC PANEL
ALBUMIN: 4.2 g/dL (ref 3.5–5.2)
ALT: 25 U/L (ref 0–35)
AST: 27 U/L (ref 0–37)
Alkaline Phosphatase: 62 U/L (ref 39–117)
BUN: 26 mg/dL — ABNORMAL HIGH (ref 6–23)
CALCIUM: 9.1 mg/dL (ref 8.4–10.5)
CHLORIDE: 105 meq/L (ref 96–112)
CO2: 27 meq/L (ref 19–32)
Creat: 1.21 mg/dL — ABNORMAL HIGH (ref 0.50–1.10)
Glucose, Bld: 98 mg/dL (ref 70–99)
POTASSIUM: 4.2 meq/L (ref 3.5–5.3)
SODIUM: 143 meq/L (ref 135–145)
TOTAL PROTEIN: 7.1 g/dL (ref 6.0–8.3)
Total Bilirubin: 1 mg/dL (ref 0.2–1.2)

## 2014-11-07 LAB — TSH: TSH: 0.204 u[IU]/mL — ABNORMAL LOW (ref 0.350–4.500)

## 2014-11-07 NOTE — Telephone Encounter (Signed)
[name] is calling because she has been having some vomiting for about a week , like every other day . Please call    Thanks

## 2014-11-07 NOTE — Patient Instructions (Signed)
Take 2 Prilosec nightly

## 2014-11-07 NOTE — Progress Notes (Signed)
   Subjective:    Patient ID: [name], female    DOB: 06-Sep-1923, 81 y.o.   MRN: [id number]  HPI She complains of a one-week history of difficulty with vomiting that occurs only in the mornings. She will wake up and take her thyroid medication. Within a short period of time she will become nauseated and vomit. She does not have difficulty the rest of the day. She does have some mild pain with vomiting but is able to eat and do whatever she wants after that. Her bowel habits have been slightly constipated. She continues on her other medications. Apparently food has no effect.   Review of Systems     Objective:   Physical Exam Alert and in no distress. Tympanic membranes and canals are normal. Pharyngeal area is normal. Neck is supple without adenopathy or thyromegaly. Cardiac exam shows a regular sinus rhythm with a 2/6 SEM, no gallops. Lungs are clear to auscultation.Abdominal exam shows decreased bowel sounds without masses or tenderness.        Assessment & Plan:  Vomiting without nausea, vomiting of unspecified type - Plan: CBC with Differential/Platelet, Comprehensive metabolic panel  Other specified hypothyroidism - Plan: TSH Etiology of this is unclear. I will have her use Prilosec at 40 mg tonight. I will call tomorrow with the blood results.

## 2014-11-07 NOTE — Telephone Encounter (Signed)
Spoke to pt. Vomiting ~1 time a day, was clear/liquid, Friday and Saturday night "lost food".  Denies vomiting x2 days, states still nauseous.  No recent med changes. No blood in vomit/stool/etc. Sore back and chest w/ vomiting. Encouraged adequate hydration. Advised to contact PCP to r/o acute illness, GI concerns, etc.   Encouraged to call if recommended to f/u w/ [place]. Encouraged to call for new symptoms. Pt verbalized understanding of instructions.

## 2014-11-08 ENCOUNTER — Encounter: Payer: Self-pay | Admitting: Gastroenterology

## 2014-11-08 ENCOUNTER — Other Ambulatory Visit: Payer: Self-pay

## 2014-11-08 DIAGNOSIS — R112 Nausea with vomiting, unspecified: Secondary | ICD-10-CM

## 2014-11-10 ENCOUNTER — Telehealth: Payer: Self-pay | Admitting: Family Medicine

## 2014-11-10 ENCOUNTER — Other Ambulatory Visit: Payer: Self-pay

## 2014-11-10 ENCOUNTER — Telehealth: Payer: Self-pay

## 2014-11-10 NOTE — Telephone Encounter (Signed)
Patient

## 2014-11-10 NOTE — Telephone Encounter (Signed)
Dr. Collene Mares requesting to speak to Dr Redmond School regarding this pt. Please call Dr Collene Mares back @ [phone]

## 2014-11-14 ENCOUNTER — Telehealth: Payer: Self-pay | Admitting: Family Medicine

## 2014-11-14 NOTE — Telephone Encounter (Signed)
This appointment was made on the 16th [facility]  11/24/14 at 9;40 arrive at 9:20 Dr.Gore and have faxed all information to them Pt is aware

## 2014-11-14 NOTE — Telephone Encounter (Signed)
Set her up to see ENT at the request of Dr. Collene Mares for evaluation of her difficulty with vomiting.

## 2014-11-14 NOTE — Telephone Encounter (Signed)
Pt stated that she spoke to [facility] over the weekend and was told to call back today. I asked pt if she was to make an appt and she stated no she was just to call. She states that she is still feeling really bad. Pt can be reached at [phone].

## 2014-11-23 ENCOUNTER — Ambulatory Visit: Payer: Medicare Other | Admitting: Gastroenterology

## 2014-11-29 ENCOUNTER — Telehealth: Payer: Self-pay

## 2014-11-29 NOTE — Telephone Encounter (Signed)
Patient seen Dr.Gore gave two nose sprays and she is still sick on stomach nothing is any better she went out last night for fish fry and had leave due to vomiting and vomiting this morning she said she is so sick she is ready to go to hospital maybe they can find out what is wrong . She doesn't  know where to go next she has also saw Dr.Mann

## 2014-12-01 NOTE — Telephone Encounter (Signed)
Dr.Lalonde I sent you this 11/29/14 did you see it I did not get a response

## 2014-12-07 ENCOUNTER — Ambulatory Visit (INDEPENDENT_AMBULATORY_CARE_PROVIDER_SITE_OTHER): Payer: Medicare Other | Admitting: Pharmacist Clinician (PhC)/ Clinical Pharmacy Specialist

## 2014-12-07 DIAGNOSIS — Z7901 Long term (current) use of anticoagulants: Secondary | ICD-10-CM

## 2014-12-07 DIAGNOSIS — I480 Paroxysmal atrial fibrillation: Secondary | ICD-10-CM

## 2014-12-07 LAB — POCT INR: INR: 4.8

## 2014-12-07 MED ORDER — WARFARIN SODIUM 1 MG PO TABS
ORAL_TABLET | ORAL | Status: DC
Start: 2014-12-07 — End: 2015-02-06

## 2014-12-08 ENCOUNTER — Telehealth: Payer: Self-pay | Admitting: Cardiovascular Disease

## 2014-12-08 NOTE — Telephone Encounter (Signed)
Information given to patient  She verbalized understanding . She states if she has a fluid build up she will go to ER

## 2014-12-08 NOTE — Telephone Encounter (Signed)
PLEASE SEE PREVIOUS NOTE.  INSTRUCTION GIVEN PER DR Martinique

## 2014-12-08 NOTE — Telephone Encounter (Signed)
Patient had endoscopy on Monday with Dr. Collene Mares - found to only have GERD. She has been having issues with vomiting since June 13. She was changed to ethacrynic acid ([name]) 25 MG tablet end of March (unable to tolerate furosemide, torsemide, anything with sulfa). Dr. Collene Mares told her that the medication may have caused her GERD and vomiting issue. Patient is concerned about this as she knows if she comes off diuretic then she may end up in heart failure again.   Will defer to DOD (Dr. Martinique) to review and advise as Dr. Sallyanne Kuster is unavailable

## 2014-12-08 NOTE — Telephone Encounter (Signed)
Options for diuretics limited.I would hold ethacrynic acid for 3 days and see if symptoms improve. If symptoms are no better then we will know its not the medication. If symptoms do improve I would resume in 3 days and see if symptoms recur.  Zineb Glade Martinique MD, [facility]

## 2014-12-08 NOTE — Telephone Encounter (Signed)
Pt had Endo on Monday,been vomiting every since. Her GI doctor thinks it might be her fluid pill.Please call to advise.

## 2014-12-12 ENCOUNTER — Telehealth: Payer: Self-pay | Admitting: Cardiovascular Disease

## 2014-12-12 MED ORDER — SPIRONOLACTONE 25 MG PO TABS: 25.0000 mg | ORAL_TABLET | Freq: Every day | ORAL | Status: DC

## 2014-12-12 NOTE — Telephone Encounter (Signed)
Pt have taken the [name],it have made her sick again.

## 2014-12-12 NOTE — Telephone Encounter (Signed)
Patient states she stopped the Edercrin for 3 days.  she states she stopped vomiting. She wanted to know what to do, now.  RN instructed to patient to follow the original order per Dr Martinique-- restart the medication [name] -to see if symptoms return. Patient verbalized understanding.

## 2014-12-12 NOTE — Telephone Encounter (Signed)
Spoke to patient. Cites poor tolerance to [name]. She took a break from taking for 3 days over the weekend d/t nausea/vomiting. She had this issue before. States she resumed yesterday and began to have nausea and vomiting again.  She has sulfa, furosemide, torsemide allergies. Need recommendation on any options for medications for her.

## 2014-12-12 NOTE — Telephone Encounter (Signed)
Reviewed with Dr. Stanford Breed DOD.  Suggested we decrease [name] to 25 mg bid and add spironolactone 25 mg qd, BMET in 1 week.  Called patient to advise her of this, she is not excited about continuing any dose of [name].  I explained the risk of going back into HF and asked that she try the 1/2 dose with spironolactone for the next few days and see how she feels.  She had an endoscopy last week and was started on omeprazole 40 mg qd.  She also states some increase in edema over the weekend.  Explained that she will need to call the office if her weight increases and edema continues.  Patient will be in office next Monday for INR check, will have BMET drawn same day.  (Note pt is currently on potassium 10 mEq daily - may need to stop that after BMET checked)

## 2014-12-19 ENCOUNTER — Ambulatory Visit (INDEPENDENT_AMBULATORY_CARE_PROVIDER_SITE_OTHER): Payer: Medicare Other | Admitting: Pharmacist Clinician (PhC)/ Clinical Pharmacy Specialist

## 2014-12-19 ENCOUNTER — Telehealth: Payer: Self-pay | Admitting: Pharmacist Clinician (PhC)/ Clinical Pharmacy Specialist

## 2014-12-19 DIAGNOSIS — Z7901 Long term (current) use of anticoagulants: Secondary | ICD-10-CM | POA: Diagnosis not present

## 2014-12-19 DIAGNOSIS — I480 Paroxysmal atrial fibrillation: Secondary | ICD-10-CM

## 2014-12-19 LAB — POCT INR: INR: 1.8

## 2014-12-19 NOTE — Telephone Encounter (Signed)
Pt came in for INR check today.  Still feeling bad, nausea and vomiting.  No swelling or SOB since decreasing ethacrynic acid from 50 bid to 25 bid and adding spironolactone.  She has lost some weight and looks pale today.  Advised that she stop the ethacryinc acid altogether and continue with just spironolactone.  She is to call me on Thursday with update.  Advised that any increased SOB and she would need to go to ER for evaluation.  Pt voiced understanding.  She has appointment already set to see Dr. Sallyanne Kuster next Wednesday.

## 2014-12-22 ENCOUNTER — Telehealth: Payer: Self-pay | Admitting: Pharmacist Clinician (PhC)/ Clinical Pharmacy Specialist

## 2014-12-22 NOTE — Telephone Encounter (Signed)
Pt called, LMOM stating feeling much better since stopping ethacrynic acid  Returned call and spoke with patient.  She states no nausea or vomiting for last 2 days, feels much better.  Reports no noticeable change in edema - usually none in the mornings and slight increases throughout day, but always better next morning.  Advised her to continue with just spironolactone and keep her appointment with Dr. Loletha Grayer on Wednesday.  In the meantime if she develops any CHF symptoms, she is to call office or go to UC/ER.  Pt voiced understanding.

## 2014-12-28 ENCOUNTER — Ambulatory Visit (INDEPENDENT_AMBULATORY_CARE_PROVIDER_SITE_OTHER): Payer: Medicare Other | Admitting: Pharmacist Clinician (PhC)/ Clinical Pharmacy Specialist

## 2014-12-28 ENCOUNTER — Ambulatory Visit (INDEPENDENT_AMBULATORY_CARE_PROVIDER_SITE_OTHER): Payer: Medicare Other | Admitting: Cardiovascular Disease

## 2014-12-28 ENCOUNTER — Encounter: Payer: Self-pay | Admitting: Cardiovascular Disease

## 2014-12-28 VITALS — BP 154/78 | HR 66 | Resp 16 | Ht 63.0 in | Wt 133.0 lb

## 2014-12-28 DIAGNOSIS — I25118 Atherosclerotic heart disease of native coronary artery with other forms of angina pectoris: Secondary | ICD-10-CM

## 2014-12-28 DIAGNOSIS — E785 Hyperlipidemia, unspecified: Secondary | ICD-10-CM

## 2014-12-28 DIAGNOSIS — I480 Paroxysmal atrial fibrillation: Secondary | ICD-10-CM

## 2014-12-28 DIAGNOSIS — Z7901 Long term (current) use of anticoagulants: Secondary | ICD-10-CM

## 2014-12-28 DIAGNOSIS — Z79899 Other long term (current) drug therapy: Secondary | ICD-10-CM

## 2014-12-28 DIAGNOSIS — I10 Essential (primary) hypertension: Secondary | ICD-10-CM

## 2014-12-28 DIAGNOSIS — I350 Nonrheumatic aortic (valve) stenosis: Secondary | ICD-10-CM

## 2014-12-28 DIAGNOSIS — I5032 Chronic diastolic (congestive) heart failure: Secondary | ICD-10-CM

## 2014-12-28 LAB — POCT INR: INR: 1.7

## 2014-12-28 NOTE — Patient Instructions (Signed)
Your physician wants you to follow-up in: 4 Months. You will receive a reminder letter in the mail two months in advance. If you don't receive a letter, please call our office to schedule the follow-up appointment.  Your physician recommends that you return for lab work in: 1 Month CMP and [place] has recommended you make the following change in your medication: STOP Potassium and Zetia

## 2014-12-29 ENCOUNTER — Encounter: Payer: Self-pay | Admitting: Cardiovascular Disease

## 2014-12-29 NOTE — Progress Notes (Signed)
Patient ID: [name], female   DOB: [birth year], 81 y.o.   MRN: [id number]     Cardiology Office Note   Date:  12/29/2014   ID:  [name], DOB March 15, 1924, MRN [id number]  PCP:  Wyatt Haste, MD  Cardiologist:   Sanda Klein, MD   Chief Complaint  Patient presents with  . [place]    Patient has no complaints.      History of Present Illness: [name] is a 81 y.o. female who presents for  Follow-up for diastolic heart failure and coronary artery disease, paroxysmal atrial fibrillation.  [name] developed severe nausea and vomiting that resolved after discontinuation of ethacrynic acid and recurred upon rechallenge. This medication was being used as a substitute for her loop diuretics since she developed a severe rash with sulfa drugs. She is currently only taking spironolactone. She is still taking a potassium supplement. Her vomiting has resolved completely.    she is well compensated from a symptom point of view and has no dyspnea at rest or with exertion and no lower extremity edema. She also denies angina pectoris.   She expresses a desire to limit the number of medications that she takes. 1 obvious target would be ezetimibe. She will stop this and will recheck her lipids in about a month's time. We'll also recheck her renal function and potassium level then.  She has coronary artery disease and received a bare-metal stent (3x18 mm intake he) to the right coronary in 2010. She has not had  significant angina pectoris since.  She has diastolic heart failure with a remote acute exacerbation in June 2014 and March 2016. Her echo shows an ejection fraction of 55-60% and pseudo-normal mitral inflow.  She has moderate aortic stenosis (estimated valve area 1.2 cm square, mean gradient 18 mm Hg). She has a history of recurrent paroxysmal atrial fibrillation and has required cardioversion three times in the past. She is on chronic warfarin anticoagulation and  has not had stroke or bleeding complications.  After her last cardioversion in March 2016, despite a higher dose of amiodarone she still reverted to atrial fibrillation and we are now treating this conservatively, with rate control. She has treated hyperlipidemia, hypertension and hypothyroidism.    Past Medical History  Diagnosis Date  . Chronic diastolic CHF (congestive heart failure)     a. 10/2012 Echo: EF 55-60%, no rwma, Gr 2 DD, moderate AS, mod dil LA, mildly to mod dil RA.  Marland Kitchen Hypothyroidism   . Dyslipidemia   . Hypertension   . PAF (paroxysmal atrial fibrillation)     a. CHA2DS2VASc = 7-->chronic coumadin;  b. s/p DCCV 05/2011, 05/2012, 07/2014;  c. On amio.  Marland Kitchen  Acute respiratiory failure requiring BiPap 06/24/2011  . Aortic valvular stenosis, moderate to severe 06/24/2011    a. 10/2012 Echo: mod AS, Valve 1.25 cm^2 (VTI), 1.19cm^2 (Vmax).  . Allergy   . Coronary artery disease     a. 03/2009 PCI RCA (3.0x18 BMS).  . Exertional shortness of breath   . Arthritis     "back" (10/30/2012)  . Rash     a. felt to be 2/2 lasix/torsemide in setting of sulfa allergy (lasix d/c ~ 06/2014, torsemide d/c 08/09/2014).    Past Surgical History  Procedure Laterality Date  . Cardioversion  06/19/2011    Procedure: CARDIOVERSION;  Surgeon: Sanda Klein, MD;  Location: [place];  Service: Cardiovascular;  Laterality: N/A;  . Appendectomy    . Angioplasty    .  Cataract extraction w/ intraocular lens  implant, bilateral    . Lumbar disc surgery      "had to go in twice and clean out scar tissue" (10/30/2012)  . Coronary angioplasty with stent placement      "1" (10/30/2012)  . Dilation and curettage of uterus    . Excisional hemorrhoidectomy    . Wrist fracture surgery Left   . Shoulder acromioplasty Left     "fell; put a new ball in" (10/30/2012)  . Cardioversion  07/11/2010    Successful coversion to sinus rhythm  . Cardiac catheterization  10/06/2009    Continue medical therapy  . Cardiac  catheterization  04/07/2009    RCA stented with a 3x13m stent resulting in a reduction of 90% narrowing to normal  . Cardioversion N/A 08/10/2014    Procedure: CARDIOVERSION;  Surgeon: MSanda Klein MD;  [facility];  Service: Cardiovascular;  Laterality: N/A;     Current Outpatient Prescriptions  Medication Sig Dispense Refill  . acetaminophen (TYLENOL) 650 MG CR tablet Take 650 mg by mouth every 8 (eight) hours.     .Marland Kitchenatorvastatin (LIPITOR) 40 MG tablet TAKE 1 TABLET DAILY 90 tablet 3  . fluticasone (FLONASE) 50 MCG/ACT nasal spray Place 2 sprays into both nostrils daily. Use as directed    . folic acid (FOLVITE) 1 MG tablet Take 2 tablets (2 mg total) by mouth daily. 180 tablet 1  . ipratropium (ATROVENT) 0.03 % nasal spray Place 2 sprays into both nostrils daily. Use as directed    . levothyroxine (SYNTHROID, LEVOTHROID) 88 MCG tablet Take 1 tablet (88 mcg total) by mouth daily. 90 tablet 1  . loratadine (CLARITIN) 10 MG tablet Take 10 mg by mouth daily.    . metoprolol tartrate (LOPRESSOR) 25 MG tablet Take 1 tablet (25 mg total) by mouth 2 (two) times daily. 60 tablet 6  . Multiple Vitamins-Minerals (CENTRUM SILVER) tablet Take 1 tablet by mouth daily.      .Marland Kitchenomeprazole (PRILOSEC) 40 MG capsule Take 40 mg by mouth daily.    . Polyvinyl Alcohol-Povidone (TEARS PLUS OP) Place 1 drop into both eyes daily.    .Marland Kitchenspironolactone (ALDACTONE) 25 MG tablet Take 1 tablet (25 mg total) by mouth daily. 30 tablet 3  . warfarin (COUMADIN) 1 MG tablet Take 1 to 1.5 tablets by mouth daily as directed by coumadin clinic 120 tablet 1   No current facility-administered medications for this visit.    Allergies:   [name]; Furosemide; Sulfa antibiotics; and Torsemide    Social History:  The patient  reports that she has never smoked. She has never used smokeless tobacco. She reports that she does not drink alcohol or use illicit drugs.   Family History:  The patient's family history  includes Alcohol abuse in her child; Cancer in her son; Cancer (age of onset: 646 in her mother; Heart attack in her father; Heart disease in her brother, brother, and father; Heart disease (age of onset: 653 in her mother; Heart failure in her mother; Hypertension in her brother, father, mother, and sister; Hypertension (age of onset: 34 in her child; Lung disease in her brother; Stroke (age of onset: 625 in her mother.    ROS:  Please see the history of present illness.    Otherwise, review of systems positive for none.   All other systems are reviewed and negative.    PHYSICAL EXAM: VS:  BP 154/78 mmHg  Pulse 66  Resp 16  Ht 5'  3" (1.6 m)  Wt 133 lb (60.328 kg)  BMI 23.57 kg/m2 , BMI Body mass index is 23.57 kg/(m^2).  General: Alert, oriented x3, no distress Head: no evidence of trauma, PERRL, EOMI, no exophtalmos or lid lag, no myxedema, no xanthelasma; normal ears, nose and oropharynx Neck: normal jugular venous pulsations and no hepatojugular reflux; brisk carotid pulses without delay and no carotid bruits Chest: clear to auscultation, no signs of consolidation by percussion or palpation, normal fremitus, symmetrical and full respiratory excursions Cardiovascular: normal position and quality of the apical impulse, irregular rhythm, normal first and second heart sounds, 2/6  Early peaking aortic ejection murmurs, no rubs or gallops Abdomen: no tenderness or distention, no masses by palpation, no abnormal pulsatility or arterial bruits, normal bowel sounds, no hepatosplenomegaly Extremities: no clubbing, cyanosis or edema; 2+ radial, ulnar and brachial pulses bilaterally; 2+ right femoral, posterior tibial and dorsalis pedis pulses; 2+ left femoral, posterior tibial and dorsalis pedis pulses; no subclavian or femoral bruits Neurological: grossly nonfocal Psych: euthymic mood, full affect   EKG:  EKG is not ordered today.   Recent Labs: 08/14/2014: B Natriuretic Peptide  208.2* 11/07/2014: ALT 25; BUN 26*; Creat 1.21*; Hemoglobin 13.2; Platelets 272; Potassium 4.2; Sodium 143; TSH 0.204*    Lipid Panel    Component Value Date/Time   CHOL 139 01/26/2014 1210   TRIG 98 01/26/2014 1210   HDL 45 01/26/2014 1210   CHOLHDL 3.1 01/26/2014 1210   VLDL 20 01/26/2014 1210   LDLCALC 74 01/26/2014 1210      Wt Readings from Last 3 Encounters:  12/28/14 133 lb (60.328 kg)  11/07/14 138 lb (62.596 kg)  10/06/14 142 lb 11.2 oz (64.728 kg)     ASSESSMENT AND PLAN:  1.  Chronic diastolic heart failure -  Unfortunately her choice of diuretic therapy is extremely limited. She has not tolerated either thiazide diuretics or any drugs with sulfa moiety , she developed nausea and vomiting with ethacrynic acid. She currently appears to be euvolemic and well compensated. The focus will be primarily on strict limitation of sodium intake. She will continue taking spironolactone but should stop taking her potassium supplement. Repeat renal function and potassium levels in the near future.  2.  Persistent atrial fibrillation with recurrence following cardioversion and amiodarone. Continue warfarin and rate control medications. Do not anticipate another attempt at cardioversion in the near future.  3.  Coronary artery disease status post remote percutaneous revascularization ( right coronary artery bare metal stent 2010) , currently asymptomatic  4.  Hypertension, well controlled (at home her blood pressure is always much better than it is recorded in our office today ).  5.  Hyperlipidemia on statin therapy. Will discontinue Zetia as part of her very reasonable request to reduce the number of medicines that she takes. She has lost  10 pounds since her last lipid assay and it is possible that we will achieve satisfactory control of LDL cholesterol without the Zetia  6.  Moderate aortic stenosis, asymptomatic    Current medicines are reviewed at length with the patient today.   The patient has concerns regarding medicines.   Labs/ tests ordered today include:  Orders Placed This Encounter  Procedures  . COMPLETE METABOLIC PANEL WITH GFR  . Lipid Profile    Patient Instructions  Your physician wants you to follow-up in: 4 Months. You will receive a reminder letter in the mail two months in advance. If you don't receive a letter, please call our office  to schedule the follow-up appointment.  Your physician recommends that you return for lab work in: 1 Month CMP and [place] has recommended you make the following change in your medication: STOP Potassium and Arnetha Massy, MD  12/29/2014 9:43 PM    Sanda Klein, MD, [facility] [phone] office [phone] pager

## 2015-01-06 ENCOUNTER — Ambulatory Visit (INDEPENDENT_AMBULATORY_CARE_PROVIDER_SITE_OTHER): Payer: Medicare Other | Admitting: Pharmacist Clinician (PhC)/ Clinical Pharmacy Specialist

## 2015-01-06 ENCOUNTER — Other Ambulatory Visit: Payer: Self-pay | Admitting: Pharmacist Clinician (PhC)/ Clinical Pharmacy Specialist

## 2015-01-06 DIAGNOSIS — I480 Paroxysmal atrial fibrillation: Secondary | ICD-10-CM | POA: Diagnosis not present

## 2015-01-06 DIAGNOSIS — Z7901 Long term (current) use of anticoagulants: Secondary | ICD-10-CM | POA: Diagnosis not present

## 2015-01-06 LAB — POCT INR: INR: 1.3

## 2015-01-06 MED ORDER — SPIRONOLACTONE 25 MG PO TABS: 25.0000 mg | ORAL_TABLET | Freq: Every day | ORAL | Status: DC

## 2015-01-16 ENCOUNTER — Ambulatory Visit (INDEPENDENT_AMBULATORY_CARE_PROVIDER_SITE_OTHER): Payer: Medicare Other | Admitting: Pharmacist Clinician (PhC)/ Clinical Pharmacy Specialist

## 2015-01-16 DIAGNOSIS — Z7901 Long term (current) use of anticoagulants: Secondary | ICD-10-CM

## 2015-01-16 DIAGNOSIS — I480 Paroxysmal atrial fibrillation: Secondary | ICD-10-CM | POA: Diagnosis not present

## 2015-01-16 LAB — POCT INR: INR: 2

## 2015-02-01 LAB — COMPLETE METABOLIC PANEL WITH GFR
ALK PHOS: 73 U/L (ref 33–130)
ALT: 12 U/L (ref 6–29)
AST: 19 U/L (ref 10–35)
Albumin: 4.1 g/dL (ref 3.6–5.1)
BILIRUBIN TOTAL: 0.9 mg/dL (ref 0.2–1.2)
BUN: 17 mg/dL (ref 7–25)
CALCIUM: 9 mg/dL (ref 8.6–10.4)
CO2: 27 mmol/L (ref 20–31)
CREATININE: 1.07 mg/dL — AB (ref 0.60–0.88)
Chloride: 110 mmol/L (ref 98–110)
GFR, Est African American: 53 mL/min — ABNORMAL LOW (ref >=60)
GFR, Est Non African American: 46 mL/min — ABNORMAL LOW (ref >=60)
GLUCOSE: 85 mg/dL (ref 65–99)
Potassium: 4.6 mmol/L (ref 3.5–5.3)
SODIUM: 144 mmol/L (ref 135–146)
TOTAL PROTEIN: 6.4 g/dL (ref 6.1–8.1)

## 2015-02-01 LAB — LIPID PANEL
CHOLESTEROL: 150 mg/dL (ref 125–200)
HDL: 33 mg/dL — ABNORMAL LOW (ref >=46)
LDL Cholesterol: 91 mg/dL (ref <130)
Total CHOL/HDL Ratio: 4.5 Ratio (ref <=5.0)
Triglycerides: 131 mg/dL (ref <150)
VLDL: 26 mg/dL (ref <30)

## 2015-02-03 LAB — HM MAMMOGRAPHY

## 2015-02-06 ENCOUNTER — Ambulatory Visit (INDEPENDENT_AMBULATORY_CARE_PROVIDER_SITE_OTHER): Payer: Medicare Other | Admitting: Pharmacist Clinician (PhC)/ Clinical Pharmacy Specialist

## 2015-02-06 ENCOUNTER — Other Ambulatory Visit: Payer: Self-pay | Admitting: Family Medicine

## 2015-02-06 ENCOUNTER — Other Ambulatory Visit: Payer: Self-pay | Admitting: Pharmacist Clinician (PhC)/ Clinical Pharmacy Specialist

## 2015-02-06 DIAGNOSIS — Z7901 Long term (current) use of anticoagulants: Secondary | ICD-10-CM | POA: Diagnosis not present

## 2015-02-06 DIAGNOSIS — I480 Paroxysmal atrial fibrillation: Secondary | ICD-10-CM

## 2015-02-06 LAB — POCT INR: INR: 2.5

## 2015-02-06 MED ORDER — LEVOTHYROXINE SODIUM 75 MCG PO TABS: 75.0000 ug | ORAL_TABLET | Freq: Every day | ORAL | Status: DC

## 2015-02-06 MED ORDER — WARFARIN SODIUM 1 MG PO TABS: ORAL_TABLET | ORAL | Status: DC

## 2015-02-07 ENCOUNTER — Emergency Department (HOSPITAL_BASED_OUTPATIENT_CLINIC_OR_DEPARTMENT_OTHER): Payer: Medicare Other

## 2015-02-07 ENCOUNTER — Observation Stay (HOSPITAL_BASED_OUTPATIENT_CLINIC_OR_DEPARTMENT_OTHER)
Admission: EM | Admit: 2015-02-07 | Discharge: 2015-02-09 | Disposition: A | Discharge status: Home / Self Care | Payer: Medicare Other | Attending: Cardiology | Admitting: Cardiology

## 2015-02-07 ENCOUNTER — Encounter (HOSPITAL_BASED_OUTPATIENT_CLINIC_OR_DEPARTMENT_OTHER): Payer: Self-pay | Admitting: *Deleted

## 2015-02-07 DIAGNOSIS — Z882 Allergy status to sulfonamides status: Secondary | ICD-10-CM | POA: Insufficient documentation

## 2015-02-07 DIAGNOSIS — I509 Heart failure, unspecified: Secondary | ICD-10-CM | POA: Insufficient documentation

## 2015-02-07 DIAGNOSIS — I10 Essential (primary) hypertension: Secondary | ICD-10-CM | POA: Insufficient documentation

## 2015-02-07 DIAGNOSIS — E039 Hypothyroidism, unspecified: Secondary | ICD-10-CM | POA: Insufficient documentation

## 2015-02-07 DIAGNOSIS — I480 Paroxysmal atrial fibrillation: Secondary | ICD-10-CM | POA: Diagnosis not present

## 2015-02-07 DIAGNOSIS — Z888 Allergy status to other drugs, medicaments and biological substances status: Secondary | ICD-10-CM | POA: Diagnosis not present

## 2015-02-07 DIAGNOSIS — Z9109 Other allergy status, other than to drugs and biological substances: Secondary | ICD-10-CM | POA: Diagnosis not present

## 2015-02-07 DIAGNOSIS — E049 Nontoxic goiter, unspecified: Secondary | ICD-10-CM | POA: Diagnosis present

## 2015-02-07 DIAGNOSIS — I2510 Atherosclerotic heart disease of native coronary artery without angina pectoris: Secondary | ICD-10-CM | POA: Diagnosis not present

## 2015-02-07 DIAGNOSIS — J90 Pleural effusion, not elsewhere classified: Secondary | ICD-10-CM | POA: Insufficient documentation

## 2015-02-07 DIAGNOSIS — Z79899 Other long term (current) drug therapy: Secondary | ICD-10-CM | POA: Diagnosis not present

## 2015-02-07 DIAGNOSIS — I481 Persistent atrial fibrillation: Secondary | ICD-10-CM | POA: Insufficient documentation

## 2015-02-07 DIAGNOSIS — I080 Rheumatic disorders of both mitral and aortic valves: Secondary | ICD-10-CM | POA: Insufficient documentation

## 2015-02-07 DIAGNOSIS — E785 Hyperlipidemia, unspecified: Secondary | ICD-10-CM | POA: Insufficient documentation

## 2015-02-07 DIAGNOSIS — I5033 Acute on chronic diastolic (congestive) heart failure: Principal | ICD-10-CM | POA: Insufficient documentation

## 2015-02-07 DIAGNOSIS — E071 Dyshormogenetic goiter: Secondary | ICD-10-CM

## 2015-02-07 DIAGNOSIS — Z7901 Long term (current) use of anticoagulants: Secondary | ICD-10-CM | POA: Diagnosis not present

## 2015-02-07 DIAGNOSIS — I110 Hypertensive heart disease with heart failure: Secondary | ICD-10-CM | POA: Diagnosis present

## 2015-02-07 DIAGNOSIS — I350 Nonrheumatic aortic (valve) stenosis: Secondary | ICD-10-CM

## 2015-02-07 DIAGNOSIS — R0602 Shortness of breath: Secondary | ICD-10-CM | POA: Diagnosis present

## 2015-02-07 LAB — CBC WITH DIFFERENTIAL/PLATELET
BASOS ABS: 0 10*3/uL (ref 0.0–0.1)
BASOS PCT: 0 % (ref 0–1)
Band Neutrophils: 0 % (ref 0–10)
Blasts: 0 %
EOS ABS: 0.1 10*3/uL (ref 0.0–0.7)
Eosinophils Relative: 1 % (ref 0–5)
HCT: 38.9 % (ref 36.0–46.0)
HEMOGLOBIN: 12.2 g/dL (ref 12.0–15.0)
Lymphocytes Relative: 30 % (ref 12–46)
Lymphs Abs: 3.4 10*3/uL (ref 0.7–4.0)
MCH: 30 pg (ref 26.0–34.0)
MCHC: 31.4 g/dL (ref 30.0–36.0)
MCV: 95.6 fL (ref 78.0–100.0)
METAMYELOCYTES PCT: 0 %
MYELOCYTES: 0 %
Monocytes Absolute: 1 10*3/uL (ref 0.1–1.0)
Monocytes Relative: 9 % (ref 3–12)
NRBC: 0 /100{WBCs}
Neutro Abs: 6.8 10*3/uL (ref 1.7–7.7)
Neutrophils Relative %: 60 % (ref 43–77)
PROMYELOCYTES ABS: 0 %
Platelets: 224 10*3/uL (ref 150–400)
RBC: 4.07 MIL/uL (ref 3.87–5.11)
RDW: 15.4 % (ref 11.5–15.5)
WBC: 11.3 10*3/uL — ABNORMAL HIGH (ref 4.0–10.5)

## 2015-02-07 LAB — BASIC METABOLIC PANEL
Anion gap: 8 (ref 5–15)
BUN: 20 mg/dL (ref 6–20)
CALCIUM: 8.9 mg/dL (ref 8.9–10.3)
CHLORIDE: 111 mmol/L (ref 101–111)
CO2: 25 mmol/L (ref 22–32)
CREATININE: 0.98 mg/dL (ref 0.44–1.00)
GFR calc non Af Amer: 50 mL/min — ABNORMAL LOW (ref >60)
GFR, EST AFRICAN AMERICAN: 58 mL/min — AB (ref >60)
GLUCOSE: 97 mg/dL (ref 65–99)
Potassium: 3.8 mmol/L (ref 3.5–5.1)
Sodium: 144 mmol/L (ref 135–145)

## 2015-02-07 LAB — TSH: TSH: 0.342 u[IU]/mL — AB (ref 0.350–4.500)

## 2015-02-07 LAB — BRAIN NATRIURETIC PEPTIDE: B NATRIURETIC PEPTIDE 5: 279 pg/mL — AB (ref 0.0–100.0)

## 2015-02-07 LAB — TROPONIN I
Troponin I: <0.03 ng/mL (ref <0.031)
Troponin I: <0.03 ng/mL (ref <0.031)

## 2015-02-07 MED ORDER — SPIRONOLACTONE 25 MG PO TABS
25.0000 mg | ORAL_TABLET | Freq: Every day | ORAL | Status: DC
  Administered 2015-02-08 – 2015-02-09 (×2): 25 mg via ORAL
  Filled 2015-02-07 (×2): qty 1

## 2015-02-07 MED ORDER — ACETAMINOPHEN 325 MG PO TABS
650.0000 mg | ORAL_TABLET | ORAL | Status: DC | PRN
  Administered 2015-02-07 – 2015-02-09 (×2): 650 mg via ORAL
  Filled 2015-02-07 (×2): qty 2

## 2015-02-07 MED ORDER — SODIUM CHLORIDE 0.9 % IJ SOLN
3.0000 mL | Freq: Two times a day (BID) | INTRAMUSCULAR | Status: DC
  Administered 2015-02-07 – 2015-02-09 (×4): 3 mL via INTRAVENOUS

## 2015-02-07 MED ORDER — LORATADINE 10 MG PO TABS
10.0000 mg | ORAL_TABLET | Freq: Every day | ORAL | Status: DC
  Administered 2015-02-07 – 2015-02-09 (×3): 10 mg via ORAL
  Filled 2015-02-07 (×3): qty 1

## 2015-02-07 MED ORDER — SODIUM CHLORIDE 0.9 % IV SOLN: 250.0000 mL | INTRAVENOUS | Status: DC | PRN

## 2015-02-07 MED ORDER — NITROGLYCERIN 0.4 MG SL SUBL: 0.4000 mg | SUBLINGUAL_TABLET | SUBLINGUAL | Status: DC | PRN

## 2015-02-07 MED ORDER — ADULT MULTIVITAMIN W/MINERALS CH
1.0000 | ORAL_TABLET | Freq: Every day | ORAL | Status: DC
  Administered 2015-02-07 – 2015-02-09 (×3): 1 via ORAL
  Filled 2015-02-07 (×5): qty 1

## 2015-02-07 MED ORDER — IPRATROPIUM BROMIDE 0.06 % NA SOLN
1.0000 | Freq: Every day | NASAL | Status: DC
  Administered 2015-02-09: 1 via NASAL
  Filled 2015-02-07: qty 15

## 2015-02-07 MED ORDER — METOPROLOL TARTRATE 50 MG PO TABS
25.0000 mg | ORAL_TABLET | Freq: Once | ORAL | Status: AC
  Administered 2015-02-07: 25 mg via ORAL
  Filled 2015-02-07: qty 1

## 2015-02-07 MED ORDER — ALBUTEROL SULFATE (2.5 MG/3ML) 0.083% IN NEBU: 2.5000 mg | INHALATION_SOLUTION | Freq: Four times a day (QID) | RESPIRATORY_TRACT | Status: DC | PRN

## 2015-02-07 MED ORDER — ONDANSETRON HCL 4 MG/2ML IJ SOLN: 4.0000 mg | Freq: Four times a day (QID) | INTRAMUSCULAR | Status: DC | PRN

## 2015-02-07 MED ORDER — LEVOTHYROXINE SODIUM 88 MCG PO TABS
88.0000 ug | ORAL_TABLET | Freq: Every day | ORAL | Status: DC
  Filled 2015-02-07: qty 1

## 2015-02-07 MED ORDER — POLYVINYL ALCOHOL 1.4 % OP SOLN
1.0000 [drp] | Freq: Every day | OPHTHALMIC | Status: DC
Start: 2015-02-07 — End: 2015-02-09
  Administered 2015-02-07 – 2015-02-08 (×2): 1 [drp] via OPHTHALMIC
  Filled 2015-02-07: qty 15

## 2015-02-07 MED ORDER — WARFARIN - PHARMACIST DOSING INPATIENT
Freq: Every day | Status: DC
  Administered 2015-02-08: 1

## 2015-02-07 MED ORDER — ZOLPIDEM TARTRATE 5 MG PO TABS
5.0000 mg | ORAL_TABLET | Freq: Every evening | ORAL | Status: DC | PRN
  Administered 2015-02-07 – 2015-02-08 (×2): 5 mg via ORAL
  Filled 2015-02-07 (×2): qty 1

## 2015-02-07 MED ORDER — SODIUM CHLORIDE 0.9 % IJ SOLN: 3.0000 mL | INTRAMUSCULAR | Status: DC | PRN

## 2015-02-07 MED ORDER — ACETAZOLAMIDE SODIUM 500 MG IJ SOLR
500.0000 mg | Freq: Once | INTRAMUSCULAR | Status: AC
  Administered 2015-02-07: 500 mg via INTRAVENOUS
  Filled 2015-02-07: qty 500

## 2015-02-07 MED ORDER — ATORVASTATIN CALCIUM 40 MG PO TABS
40.0000 mg | ORAL_TABLET | Freq: Every day | ORAL | Status: DC
  Administered 2015-02-07 – 2015-02-09 (×3): 40 mg via ORAL
  Filled 2015-02-07 (×3): qty 1

## 2015-02-07 MED ORDER — FOLIC ACID 1 MG PO TABS
2.0000 mg | ORAL_TABLET | Freq: Every day | ORAL | Status: DC
  Administered 2015-02-07 – 2015-02-09 (×3): 2 mg via ORAL
  Filled 2015-02-07 (×3): qty 2

## 2015-02-07 MED ORDER — ALPRAZOLAM 0.25 MG PO TABS
0.2500 mg | ORAL_TABLET | Freq: Two times a day (BID) | ORAL | Status: DC | PRN
  Filled 2015-02-07: qty 1

## 2015-02-07 MED ORDER — IPRATROPIUM BROMIDE 0.03 % NA SOLN
2.0000 | Freq: Every day | NASAL | Status: DC
  Filled 2015-02-07: qty 30

## 2015-02-07 MED ORDER — FLUTICASONE PROPIONATE 50 MCG/ACT NA SUSP
2.0000 | Freq: Every day | NASAL | Status: DC
  Administered 2015-02-08 – 2015-02-09 (×2): 2 via NASAL
  Filled 2015-02-07: qty 16

## 2015-02-07 MED ORDER — METOPROLOL TARTRATE 25 MG PO TABS
25.0000 mg | ORAL_TABLET | Freq: Two times a day (BID) | ORAL | Status: DC
  Administered 2015-02-07 – 2015-02-09 (×4): 25 mg via ORAL
  Filled 2015-02-07 (×4): qty 1

## 2015-02-07 MED ORDER — SPIRONOLACTONE 25 MG PO TABS
25.0000 mg | ORAL_TABLET | Freq: Every day | ORAL | Status: DC
  Administered 2015-02-07: 25 mg via ORAL
  Filled 2015-02-07 (×3): qty 1

## 2015-02-07 MED ORDER — WARFARIN SODIUM 2 MG PO TABS
2.0000 mg | ORAL_TABLET | Freq: Every day | ORAL | Status: DC
  Administered 2015-02-07 – 2015-02-08 (×2): 2 mg via ORAL
  Filled 2015-02-07 (×2): qty 0.5
  Filled 2015-02-07 (×2): qty 1

## 2015-02-07 NOTE — ED Notes (Signed)
States she woke up this am and felt like she couldn't catch her breath. Feels like her hrt is beating fast. No pain no other sx.

## 2015-02-07 NOTE — ED Notes (Signed)
Attempt x 1 to start IV unsuccessful.

## 2015-02-07 NOTE — ED Notes (Signed)
Report given to Paula RN

## 2015-02-07 NOTE — ED Notes (Signed)
Report called to [facility] on 3E. Pt leaving at this time via [facility].

## 2015-02-07 NOTE — H&P (Signed)
History and Physical   Patient ID: [name] MRN: [id number], DOB/AGE: [birth year] 81 y.o. Date of Encounter: 02/07/2015  Primary Physician: Wyatt Haste, MD Primary Cardiologist: Dr Sallyanne Kuster  Chief Complaint:  SOB, afib  HPI: [name] is a 81 y.o. female with a history of D-CHF, PAF on coumadin, HTN, HL, AS, and BMS RCA 2010.   She was seen in the office 12/29/2014 by Dr Sallyanne Kuster and was doing well. PAF is managed by rate control and warfarin. No sig CAD symptoms. Volume status OK but cannot take sulfa drugs, ?thiazides or ethacrynic acid. On Spirononlactone. Weight 133 lbs.   [name] has been doing well since then and has had no dietary indiscretions and no problems with weight gain or new edema.  Early this a.m., she was awakened by shortness of breath. She could not get back to sleep. She could not lie down. She denies lower extremity edema. She had no chest pain or palpitations. When her symptoms do not quickly resolve, she went on appointment center. At the [facility], she was in rapid atrial fibrillation, and felt to have volume overload as well. Her chest x-ray was abnormal and her BNP was elevated. Because of her medication allergies, she was not given diuretic. She was given metoprolol for rate control and transferred to [facility] for further evaluation and treatment.  [name] currently feels that she is doing well, but does note a small amount orthopnea and states that she got a little short of breath walking back and forth to the bathroom. She checks her weight daily and her blood pressure, writing both down. She has not gained any weight. She denies dietary indiscretions. She has no significant palpitations but is occasionally aware of the irregular heartbeat. She currently does not feel very much different from baseline.  Past Medical History  Diagnosis Date  . Chronic diastolic CHF (congestive heart failure)     a. 10/2012 Echo: EF  55-60%, no rwma, Gr 2 DD, moderate AS, mod dil LA, mildly to mod dil RA.  Marland Kitchen Hypothyroidism   . Dyslipidemia   . Hypertension   . PAF (paroxysmal atrial fibrillation)     a. CHA2DS2VASc = 7-->chronic coumadin;  b. s/p DCCV 05/2011, 05/2012, 07/2014;  c. On amio.  Marland Kitchen  Acute respiratiory failure requiring BiPap 06/24/2011  . Aortic valvular stenosis, moderate to severe 06/24/2011    a. 10/2012 Echo: mod AS, Valve 1.25 cm^2 (VTI), 1.19cm^2 (Vmax).  . Allergy   . Coronary artery disease     a. 03/2009 PCI RCA (3.0x18 BMS).  . Exertional shortness of breath   . Arthritis     "back" (10/30/2012)  . Rash     a. felt to be 2/2 lasix/torsemide in setting of sulfa allergy (lasix d/c ~ 06/2014, torsemide d/c 08/09/2014).    Surgical History:  Past Surgical History  Procedure Laterality Date  . Cardioversion  06/19/2011    Procedure: CARDIOVERSION;  Surgeon: Sanda Klein, MD;  Location: [place];  Service: Cardiovascular;  Laterality: N/A;  . Appendectomy    . Angioplasty    . Cataract extraction w/ intraocular lens  implant, bilateral    . Lumbar disc surgery      "had to go in twice and clean out scar tissue" (10/30/2012)  . Coronary angioplasty with stent placement      "1" (10/30/2012)  . Dilation and curettage of uterus    . Excisional hemorrhoidectomy    . Wrist fracture  surgery Left   . Shoulder acromioplasty Left     "fell; put a new ball in" (10/30/2012)  . Cardioversion  07/11/2010    Successful coversion to sinus rhythm  . Cardiac catheterization  10/06/2009    Continue medical therapy  . Cardiac catheterization  04/07/2009    RCA stented with a 3x38m stent resulting in a reduction of 90% narrowing to normal  . Cardioversion N/A 08/10/2014    Procedure: CARDIOVERSION;  Surgeon: MSanda Klein MD;  [facility];  Service: Cardiovascular;  Laterality: N/A;     I have reviewed the patient's current medications. Prior to Admission medications   Medication Sig Start Date End Date  Taking? Authorizing Provider  acetaminophen (TYLENOL) 650 MG CR tablet Take 650 mg by mouth every 8 (eight) hours.    Yes Historical Provider, MD  atorvastatin (LIPITOR) 40 MG tablet TAKE 1 TABLET DAILY 11/04/14  Yes Mihai Croitoru, MD  fluticasone (FLONASE) 50 MCG/ACT nasal spray Place 2 sprays into both nostrils daily. Use as directed 11/24/14  Yes Historical Provider, MD  folic acid (FOLVITE) 1 MG tablet Take 2 tablets (2 mg total) by mouth daily. 08/22/14  Yes Mihai Croitoru, MD  ipratropium (ATROVENT) 0.03 % nasal spray Place 2 sprays into both nostrils daily. Use as directed 11/24/14  Yes Historical Provider, MD  levothyroxine (SYNTHROID, LEVOTHROID) 75 MCG tablet Take 1 tablet (75 mcg total) by mouth daily. 02/06/15  Yes JDenita Lung MD  loratadine (CLARITIN) 10 MG tablet Take 10 mg by mouth daily.   Yes Historical Provider, MD  metoprolol tartrate (LOPRESSOR) 25 MG tablet Take 1 tablet (25 mg total) by mouth 2 (two) times daily. 08/19/14  Yes CRogelia Mire NP  Multiple Vitamins-Minerals (CENTRUM SILVER) tablet Take 1 tablet by mouth daily.     Yes Historical Provider, MD  Polyvinyl Alcohol-Povidone (TEARS PLUS OP) Place 1 drop into both eyes daily.   Yes Historical Provider, MD  spironolactone (ALDACTONE) 25 MG tablet Take 1 tablet (25 mg total) by mouth daily. 01/06/15  Yes Mihai Croitoru, MD  warfarin (COUMADIN) 1 MG tablet Take 2 tablets by mouth daily or as directed by coumadin clinic 02/06/15  Yes Mihai Croitoru, MD   Scheduled Meds: . spironolactone  25 mg Oral Daily   Continuous Infusions:  PRN Meds:.  Allergies:  Allergies  Allergen Reactions  . [name] [Ethacrynic Acid] Nausea And Vomiting  . Tape Other (See Comments)    Tears skin off.  Please use "paper" tape only.  . Furosemide Rash  . Sulfa Antibiotics Itching and Rash  . Torsemide Rash    Social History   Social History  . Marital Status: Married    Spouse Name: N/A  . Number of Children: 1  . Years of  Education: N/A   Occupational History  . Retired [employer]  Social History Main Topics  . Smoking status: Never Smoker   . Smokeless tobacco: Never Used  . Alcohol Use: No  . Drug Use: No  . Sexual Activity: No   Other Topics Concern  . Not on file   Social History Narrative   [place]    Family History  Problem Relation Age of Onset  . Heart disease Brother   . Heart disease Brother   . Lung disease Brother   . Heart disease Mother 6101 . Cancer Mother 631   Uterine  . Stroke Mother 695 . Heart disease Father   .  Hypertension Child 34  . Alcohol abuse Child   . Heart attack Father   . Heart failure Mother   . Cancer Son   . Hypertension Mother   . Hypertension Father   . Hypertension Sister   . Hypertension Brother    Family Status  Relation Status Death Age  . Brother Deceased 46  . Brother Deceased 43  . Mother Deceased 33  . Father Deceased 67  . Child Alive   . Brother Alive   . Sister Alive   . Sister Alive     Review of Systems:   Full 14-point review of systems otherwise negative except as noted above.  Physical Exam: Blood pressure 172/82, pulse 85, temperature 97.5 F (36.4 C), temperature source Oral, resp. rate 20, height '5\' 3"'$  (1.6 m), weight 131 lb 14.4 oz (59.829 kg), SpO2 97 %. General: Well developed, well nourished,female in no acute distress. Head: Normocephalic, atraumatic, sclera non-icteric, no xanthomas, nares are without discharge. Dentition: good for age Neck: Radiation of murmur to both carotids. JVD at 8 cm w/ mild hepatojugular reflux. No thyromegally Lungs: Good expansion bilaterally. without wheezes or rhonchi. Rales bases w/ decreased BS on L.  Heart: IRRegular rate and rhythm with S1 and faint S2.  No S3 or S4.  2/6 murmur, no rubs, or gallops appreciated. Abdomen: Soft, non-tender, non-distended with normoactive bowel sounds. No hepatomegaly. No rebound/guarding. No obvious  abdominal masses. Msk:  Strength and tone appear normal for age. No joint deformities or effusions, no spine or costo-vertebral angle tenderness. Extremities: No clubbing or cyanosis. Trace edema.  Distal pedal pulses are 2+ in 4 extrem Neuro: Alert and oriented X 3. Moves all extremities spontaneously. No focal deficits noted. Psych:  Responds to questions appropriately with a normal affect. Skin: No rashes or lesions noted  Labs:   Lab Results  Component Value Date   WBC 11.3* 02/07/2015   HGB 12.2 02/07/2015   HCT 38.9 02/07/2015   MCV 95.6 02/07/2015   PLT 224 02/07/2015    Recent Labs  02/06/15 1136  INR 2.5     Recent Labs Lab 02/07/15 0845  NA 144  K 3.8  CL 111  CO2 25  BUN 20  CREATININE 0.98  CALCIUM 8.9  GLUCOSE 97    Recent Labs  02/07/15 0845  TROPONINI <0.03   Lab Results  Component Value Date   CHOL 150 01/31/2015   HDL 33* 01/31/2015   LDLCALC 91 01/31/2015   TRIG 131 01/31/2015   B NATRIURETIC PEPTIDE  Date/Time Value Ref Range Status  02/07/2015 08:45 AM 279.0* 0.0 - 100.0 pg/mL Final  08/14/2014 06:10 AM 208.2* 0.0 - 100.0 pg/mL Final   Radiology/Studies: Dg Chest 2 View 02/07/2015   CLINICAL DATA:  Shortness of breath today. Nonsmoker. Initial encounter.  EXAM: CHEST  2 VIEW  COMPARISON:  08/14/2014 and 01/02/2013.  FINDINGS: The heart size and mediastinal contours are stable. There is aortic atherosclerosis, mild cardiomegaly and chronic vascular congestion with increased interstitial prominence from the 2014 examination. Biapical scarring appears unchanged. There is a new loculated pleural effusion posteriorly on the left, best seen on the lateral view. Left shoulder arthroplasty noted. No acute osseous findings seen.  IMPRESSION: Possible interstitial pulmonary edema with new small loculated pleural effusion on the left.   Electronically Signed   By: Richardean Sale M.D.   On: 02/07/2015 09:35   Echo: 11/01/2012 Conclusions - Left  ventricle: The cavity size was normal. Wall thickness was normal. Systolic  function was normal. The estimated ejection fraction was in the range of 55% to 60%. Wall motion was normal; there were no regional wall motion abnormalities. Features are consistent with a pseudonormal left ventricular filling pattern, with concomitant abnormal relaxation and increased filling pressure (grade 2 diastolic dysfunction). Doppler parameters are consistent with elevated mean left atrial fillingpressure. - Aortic valve: Valve mobility was restricted. There was moderate stenosis. Valve area: 1.25cm^2(VTI). Valve area: 1.19cm^2 (Vmax). - Mitral valve: Calcified annulus. Mildly thickened leaflets - Left atrium: The atrium was moderately dilated. - Right atrium: The atrium was mildly to moderately dilated. - Pericardium, extracardiac: A trivial, free-flowing pericardial effusion was identified circumferential to the heart.  ECG:  02/07/2015 Atrial fibrillation with rapid ventricular response Heart rate 109   ASSESSMENT AND PLAN:  Principal Problem:   Acute on chronic diastolic CHF (congestive heart failure), NYHA class 3 - Feels she may have lost weight and put on some fluid. - 131 pounds, she is lower than she has been previously. - However she did have some volume overload on exam and by x-ray. - She also has increased dyspnea on exertion. - Diuretic therapy has been problematic because she had a significantly thickened rash with torsemide and furosemide. She had nausea and vomiting with ethacrynic acid. Per Dr. Victorino December note, she also did not tolerate thiazide diuretic but upon research by the pharmacist of records, no documentation is available for this and the patient does not recognize any of the names.  - M.D. advise on increasing spironolactone, versus Acetazolamide, metolazone or a trial of HCTZ.   Active Problems:   HTN (hypertension) - Blood pressure higher than  usual today, follow with diuresis.    Hypothyroidism - Continue home Synthroid and check TSH if she does not have a recent one.    Aortic valvular stenosis, moderate to severe - Last echo was in 2014, recheck    Pleural effusion, left - Follow     Long term current use of anticoagulant therapy - Continue Coumadin, she is therapeutic     Persistent atrial fibrillation - Follow her rate on telemetry  - Continue beta blocker    Signed, Lenoard Aden 02/07/2015 3:36 PM Beeper [phone]    History and all data above reviewed.  Patient examined.  I agree with the findings as above.  The patient has mild dyspnea that started this AM.  She has otherwise been doing well and has been able to do such things as canning apples with out difficulty until getting SOB this AM.  The patient denies any new symptoms such as chest discomfort, neck or arm discomfort. There has been no new PND or orthopnea. There have been no reported presyncope or syncope.The patient exam reveals [id number]  ,  Lungs: Few coarse crackles  ,  Abd: Positive bowel sounds, no rebound no guarding, Ext No edema  .  All available labs, radiology testing, previous records reviewed. Agree with documented assessment and plan.  SOB:  Acute on chronic diastolic HF.  I think that she has mild volume overload.  Her multiple diuretic allergies will make choosing a medication difficult.  We have consulted with pharmacy and the plan is as above.   Jeneen Rinks Oziah Vitanza  4:15 PM  02/07/2015

## 2015-02-07 NOTE — ED Notes (Signed)
[facility] is aware of room at [facility] [id number]--

## 2015-02-07 NOTE — ED Notes (Signed)
[facility] and Nevin Bloodgood is transporting patient to room 3E24 at [facility]

## 2015-02-07 NOTE — ED Notes (Signed)
Aldactone is not in Pyxis. Dr. Doy Mince aware. States pt may take hers from home if she has it. Pt states she does not have it with her.

## 2015-02-07 NOTE — ED Notes (Signed)
Attempt to call report to 3E. Left phone number with Network engineer.

## 2015-02-07 NOTE — ED Notes (Signed)
[facility] arrived to [facility].

## 2015-02-07 NOTE — ED Notes (Signed)
Pt placed on cardiac monitor 

## 2015-02-07 NOTE — Progress Notes (Signed)
ANTICOAGULATION CONSULT NOTE - Initial Consult  Pharmacy Consult for Coumadin Indication: atrial fibrillation  Allergies  Allergen Reactions  . [name] [Ethacrynic Acid] Nausea And Vomiting  . Tape Other (See Comments)    Tears skin off.  Please use "paper" tape only.  . Furosemide Rash  . Sulfa Antibiotics Itching and Rash  . [name] Rash    Per Dr Sallyanne Kuster, had a skin reaction with thiazides prior to use of Lasix  . Torsemide Rash    Patient Measurements: Height: '5\' 3"'$  (160 cm) Weight: 131 lb 14.4 oz (59.829 kg) IBW/kg (Calculated) : 52.4   Vital Signs: Temp: 97.5 F (36.4 C) (09/13 1254) Temp Source: Oral (09/13 1254) BP: 172/82 mmHg (09/13 1254) Pulse Rate: 85 (09/13 1254)  Labs:  Recent Labs  02/06/15 1136 02/07/15 0845  HGB  --  12.2  HCT  --  38.9  PLT  --  224  INR 2.5  --   CREATININE  --  0.98  TROPONINI  --  <0.03    Estimated Creatinine Clearance: 32.2 mL/min (by C-G formula based on Cr of 0.98).   Medical History: Past Medical History  Diagnosis Date  . Chronic diastolic CHF (congestive heart failure)     a. 10/2012 Echo: EF 55-60%, no rwma, Gr 2 DD, moderate AS, mod dil LA, mildly to mod dil RA.  Marland Kitchen Hypothyroidism   . Dyslipidemia   . Hypertension   . PAF (paroxysmal atrial fibrillation)     a. CHA2DS2VASc = 7-->chronic coumadin;  b. s/p DCCV 05/2011, 05/2012, 07/2014;  c. On amio.  Marland Kitchen  Acute respiratiory failure requiring BiPap 06/24/2011  . Aortic valvular stenosis, moderate to severe 06/24/2011    a. 10/2012 Echo: mod AS, Valve 1.25 cm^2 (VTI), 1.19cm^2 (Vmax).  . Allergy   . Coronary artery disease     a. 03/2009 PCI RCA (3.0x18 BMS).  . Exertional shortness of breath   . Arthritis     "back" (10/30/2012)  . Rash     a. felt to be 2/2 lasix/torsemide in setting of sulfa allergy (lasix d/c ~ 06/2014, torsemide d/c 08/09/2014).    Medications:  Prescriptions prior to admission  Medication Sig Dispense Refill Last Dose  .  acetaminophen (TYLENOL) 650 MG CR tablet Take 650 mg by mouth 3 (three) times daily.    02/07/2015 at Unknown time  . albuterol (PROVENTIL HFA;VENTOLIN HFA) 108 (90 BASE) MCG/ACT inhaler Inhale 2 puffs into the lungs every 6 (six) hours as needed for wheezing or shortness of breath.   02/07/2015 at Unknown time  . atorvastatin (LIPITOR) 40 MG tablet TAKE 1 TABLET DAILY (Patient taking differently: TAKE 1 TABLET at supper time.) 90 tablet 3 02/06/2015 at Unknown time  . fluticasone (FLONASE) 50 MCG/ACT nasal spray Place 2 sprays into both nostrils daily. Use as directed   02/06/2015 at Unknown time  . folic acid (FOLVITE) 1 MG tablet Take 2 tablets (2 mg total) by mouth daily. 180 tablet 1 02/06/2015 at Unknown time  . ipratropium (ATROVENT) 0.03 % nasal spray Place 2 sprays into both nostrils daily. Use as directed   02/06/2015 at Unknown time  . loratadine (CLARITIN) 10 MG tablet Take 10 mg by mouth daily.   02/06/2015 at Unknown time  . metoprolol tartrate (LOPRESSOR) 25 MG tablet Take 1 tablet (25 mg total) by mouth 2 (two) times daily. 60 tablet 6 02/07/2015 at 0930  . Multiple Vitamins-Minerals (CENTRUM SILVER) tablet Take 1 tablet by mouth daily.  02/06/2015 at Unknown time  . Polyvinyl Alcohol-Povidone (TEARS PLUS OP) Place 1 drop into both eyes at bedtime.    02/06/2015 at Unknown time  . spironolactone (ALDACTONE) 25 MG tablet Take 1 tablet (25 mg total) by mouth daily. 90 tablet 1 02/06/2015 at Unknown time  . SYNTHROID 88 MCG tablet Take 88 mcg by mouth daily.   02/06/2015 at Unknown time  . warfarin (COUMADIN) 1 MG tablet Take 2 tablets by mouth daily or as directed by coumadin clinic (Patient taking differently: Take 2 mg by mouth daily at 6 PM. ) 180 tablet 1 02/06/2015 at Unknown time  . levothyroxine (SYNTHROID, LEVOTHROID) 75 MCG tablet Take 1 tablet (75 mcg total) by mouth daily. (Patient not taking: Reported on 02/07/2015) 90 tablet 3 Not Taking at Unknown time   Scheduled:  . acetaZOLAMIDE   500 mg Intravenous Once  . atorvastatin  40 mg Oral Daily  . fluticasone  2 spray Each Nare Daily  . folic acid  2 mg Oral Daily  . ipratropium  2 spray Each Nare Daily  . [START ON 02/08/2015] levothyroxine  88 mcg Oral QAC breakfast  . loratadine  10 mg Oral Daily  . metoprolol tartrate  25 mg Oral BID  . multivitamin with minerals  1 tablet Oral Daily  . polyvinyl alcohol  1 drop Both Eyes QHS  . sodium chloride  3 mL Intravenous Q12H  . spironolactone  25 mg Oral Daily  . [START ON 02/08/2015] spironolactone  25 mg Oral Daily    Assessment: 81 y.o female presents with chief complaint of SOB and atrial fibrillation. She is on coumadin PTA for h/o PAF.  PTA coumadin dose was 2 mg PO daily, last taken on 02/06/15. INR is therapeutic, INR = 2.5 H/H 12.2/38.9, PLTC 224,  Troponin <0.03 No bleeding noted.   Goal of Therapy:  INR 2-3 Monitor platelets by anticoagulation protocol: Yes   Plan:  Continue Coumadin '2mg'$  daily.  Daily PT/INR  Thank you for allowing pharmacy to be part of this patients care team. Nicole Cella, RPh Clinical Pharmacist Pager: [phone] 02/07/2015,4:09 PM

## 2015-02-07 NOTE — ED Provider Notes (Signed)
CSN: [id number]     Arrival date & time 02/07/15  7893 History   First MD Initiated Contact with Patient 02/07/15 0840     No chief complaint on file.    (Consider location/radiation/quality/duration/timing/severity/associated sxs/prior Treatment) Patient is a 81 y.o. female presenting with shortness of breath.  Shortness of Breath Severity:  Moderate Onset quality:  Sudden Duration: a few hours. Timing:  Constant Progression:  Unchanged Chronicity:  New Context comment:  Noticed SOB when she woke from sleep.   Relieved by:  Sitting up Exacerbated by: lying down. Associated symptoms: no abdominal pain, no chest pain, no cough, no diaphoresis, no fever and no sputum production     Past Medical History  Diagnosis Date  . Chronic diastolic CHF (congestive heart failure)     a. 10/2012 Echo: EF 55-60%, no rwma, Gr 2 DD, moderate AS, mod dil LA, mildly to mod dil RA.  Marland Kitchen Hypothyroidism   . Dyslipidemia   . Hypertension   . PAF (paroxysmal atrial fibrillation)     a. CHA2DS2VASc = 7-->chronic coumadin;  b. s/p DCCV 05/2011, 05/2012, 07/2014;  c. On amio.  Marland Kitchen  Acute respiratiory failure requiring BiPap 06/24/2011  . Aortic valvular stenosis, moderate to severe 06/24/2011    a. 10/2012 Echo: mod AS, Valve 1.25 cm^2 (VTI), 1.19cm^2 (Vmax).  . Allergy   . Coronary artery disease     a. 03/2009 PCI RCA (3.0x18 BMS).  . Exertional shortness of breath   . Arthritis     "back" (10/30/2012)  . Rash     a. felt to be 2/2 lasix/torsemide in setting of sulfa allergy (lasix d/c ~ 06/2014, torsemide d/c 08/09/2014).   Past Surgical History  Procedure Laterality Date  . Cardioversion  06/19/2011    Procedure: CARDIOVERSION;  Surgeon: Sanda Klein, MD;  Location: [facility];  Service: Cardiovascular;  Laterality: N/A;  . Appendectomy    . Angioplasty    . Cataract extraction w/ intraocular lens  implant, bilateral    . Lumbar disc surgery      "had to go in twice and clean out scar tissue" (10/30/2012)  .  Coronary angioplasty with stent placement      "1" (10/30/2012)  . Dilation and curettage of uterus    . Excisional hemorrhoidectomy    . Wrist fracture surgery Left   . Shoulder acromioplasty Left     "fell; put a new ball in" (10/30/2012)  . Cardioversion  07/11/2010    Successful coversion to sinus rhythm  . Cardiac catheterization  10/06/2009    Continue medical therapy  . Cardiac catheterization  04/07/2009    RCA stented with a 3x44m stent resulting in a reduction of 90% narrowing to normal  . Cardioversion N/A 08/10/2014    Procedure: CARDIOVERSION;  Surgeon: MSanda Klein MD;  Location: [facility];  Service: Cardiovascular;  Laterality: N/A;   Family History  Problem Relation Age of Onset  . Heart disease Brother   . Heart disease Brother   . Lung disease Brother   . Heart disease Mother 6102 . Cancer Mother 656   Uterine  . Stroke Mother 647 . Heart disease Father   . Hypertension Child 34  . Alcohol abuse Child   . Heart attack Father   . Heart failure Mother   . Cancer Son   . Hypertension Mother   . Hypertension Father   . Hypertension Sister   . Hypertension Brother    Social History  Substance Use Topics  .  Smoking status: Never Smoker   . Smokeless tobacco: Never Used  . Alcohol Use: No   OB History    No data available     Review of Systems  Constitutional: Negative for fever and diaphoresis.  Respiratory: Positive for shortness of breath. Negative for cough and sputum production.   Cardiovascular: Negative for chest pain.  Gastrointestinal: Negative for abdominal pain.  All other systems reviewed and are negative.     Allergies  [name]; Furosemide; Sulfa antibiotics; and Torsemide  Home Medications   Prior to Admission medications   Medication Sig Start Date End Date Taking? Authorizing Provider  acetaminophen (TYLENOL) 650 MG CR tablet Take 650 mg by mouth every 8 (eight) hours.    Yes Historical Provider, MD  atorvastatin (LIPITOR) 40  MG tablet TAKE 1 TABLET DAILY 11/04/14  Yes Mihai Croitoru, MD  fluticasone (FLONASE) 50 MCG/ACT nasal spray Place 2 sprays into both nostrils daily. Use as directed 11/24/14  Yes Historical Provider, MD  folic acid (FOLVITE) 1 MG tablet Take 2 tablets (2 mg total) by mouth daily. 08/22/14  Yes Mihai Croitoru, MD  ipratropium (ATROVENT) 0.03 % nasal spray Place 2 sprays into both nostrils daily. Use as directed 11/24/14  Yes Historical Provider, MD  levothyroxine (SYNTHROID, LEVOTHROID) 75 MCG tablet Take 1 tablet (75 mcg total) by mouth daily. 02/06/15  Yes [name], MD  loratadine (CLARITIN) 10 MG tablet Take 10 mg by mouth daily.   Yes Historical Provider, MD  metoprolol tartrate (LOPRESSOR) 25 MG tablet Take 1 tablet (25 mg total) by mouth 2 (two) times daily. 08/19/14  Yes Rogelia Mire, NP  Multiple Vitamins-Minerals (CENTRUM SILVER) tablet Take 1 tablet by mouth daily.     Yes Historical Provider, MD  Polyvinyl Alcohol-Povidone (TEARS PLUS OP) Place 1 drop into both eyes daily.   Yes Historical Provider, MD  spironolactone (ALDACTONE) 25 MG tablet Take 1 tablet (25 mg total) by mouth daily. 01/06/15  Yes Mihai Croitoru, MD  warfarin (COUMADIN) 1 MG tablet Take 2 tablets by mouth daily or as directed by coumadin clinic 02/06/15  Yes Mihai Croitoru, MD   BP 144/74 mmHg  Pulse 100  Temp(Src) 98.1 F (36.7 C)  Resp 22  Ht '5\' 3"'$  (1.6 m)  Wt 133 lb (60.328 kg)  BMI 23.57 kg/m2  SpO2 94% Physical Exam  Constitutional: She is oriented to person, place, and time. She appears well-developed and well-nourished. No distress.  HENT:  Head: Normocephalic and atraumatic.  Mouth/Throat: Oropharynx is clear and moist.  Eyes: Conjunctivae are normal. Pupils are equal, round, and reactive to light. No scleral icterus.  Neck: Neck supple.  Cardiovascular: Intact distal pulses.  An irregularly irregular rhythm present. Tachycardia present.   Murmur heard.  Systolic murmur is present with a grade  of 3/6  Pulmonary/Chest: Effort normal. No stridor. No respiratory distress. She has rales (bibasilar).  Abdominal: Soft. Bowel sounds are normal. She exhibits no distension. There is no tenderness.  Musculoskeletal: Normal range of motion. She exhibits edema (trace bilateral).  Neurological: She is alert and oriented to person, place, and time.  Skin: Skin is warm and dry. No rash noted.  Psychiatric: She has a normal mood and affect. Her behavior is normal.  Nursing note and vitals reviewed.   ED Course  Procedures (including critical care time) Labs Review Labs Reviewed  BASIC METABOLIC PANEL - Abnormal; Notable for the following:    GFR calc non Af Amer 50 (*)    GFR  calc Af Amer 58 (*)    All other components within normal limits  CBC WITH DIFFERENTIAL/PLATELET - Abnormal; Notable for the following:    WBC 11.3 (*)    All other components within normal limits  BRAIN NATRIURETIC PEPTIDE - Abnormal; Notable for the following:    B Natriuretic Peptide 279.0 (*)    All other components within normal limits  TROPONIN I  PATHOLOGIST SMEAR REVIEW    Imaging Review Dg Chest 2 View  02/07/2015   CLINICAL DATA:  Shortness of breath today. Nonsmoker. Initial encounter.  EXAM: CHEST  2 VIEW  COMPARISON:  08/14/2014 and 01/02/2013.  FINDINGS: The heart size and mediastinal contours are stable. There is aortic atherosclerosis, mild cardiomegaly and chronic vascular congestion with increased interstitial prominence from the 2014 examination. Biapical scarring appears unchanged. There is a new loculated pleural effusion posteriorly on the left, best seen on the lateral view. Left shoulder arthroplasty noted. No acute osseous findings seen.  IMPRESSION: Possible interstitial pulmonary edema with new small loculated pleural effusion on the left.   Electronically Signed   By: Richardean Sale M.D.   On: 02/07/2015 09:35   I have personally reviewed and evaluated these images and lab results as part  of my medical decision-making.   EKG Interpretation   Date/Time:  Tuesday February 07 2015 08:41:30 EDT Ventricular Rate:  109 PR Interval:    QRS Duration: 90 QT Interval:  360 QTC Calculation: 484 R Axis:   75 Text Interpretation:  Atrial fibrillation with rapid ventricular response  Nonspecific ST abnormality Abnormal ECG a fib has replaced wide complex  tachycardia Confirmed by [facility]  MD, TREY ([id number]) on 02/07/2015 10:35:59 AM      MDM   Final diagnoses:  Acute congestive heart failure, unspecified congestive heart failure type    81 yo female with a hx of a fib and CHF presenting with SOB and palpitations/a fib.  Her CHF has been complicated by inability to tolerate several diuretics.  She appears to have an acute exacerbation of CHF.  Given her home dose of metoprolol, which improved her tachycardia.  Unable to give her her home dose of spironolactone because it is not carried in this ED. She is allergic to lasix.  Transferred to [facility] for [facility] admission.    Serita Grit, MD 02/07/15 [phone]

## 2015-02-08 ENCOUNTER — Observation Stay (HOSPITAL_COMMUNITY): Payer: Medicare Other

## 2015-02-08 ENCOUNTER — Other Ambulatory Visit: Payer: Self-pay

## 2015-02-08 DIAGNOSIS — I350 Nonrheumatic aortic (valve) stenosis: Secondary | ICD-10-CM

## 2015-02-08 DIAGNOSIS — I509 Heart failure, unspecified: Secondary | ICD-10-CM

## 2015-02-08 DIAGNOSIS — E039 Hypothyroidism, unspecified: Secondary | ICD-10-CM | POA: Diagnosis not present

## 2015-02-08 DIAGNOSIS — I080 Rheumatic disorders of both mitral and aortic valves: Secondary | ICD-10-CM | POA: Diagnosis not present

## 2015-02-08 DIAGNOSIS — I10 Essential (primary) hypertension: Secondary | ICD-10-CM | POA: Diagnosis not present

## 2015-02-08 DIAGNOSIS — Z7901 Long term (current) use of anticoagulants: Secondary | ICD-10-CM | POA: Diagnosis not present

## 2015-02-08 DIAGNOSIS — I5033 Acute on chronic diastolic (congestive) heart failure: Secondary | ICD-10-CM | POA: Diagnosis not present

## 2015-02-08 LAB — TROPONIN I: Troponin I: <0.03 ng/mL (ref <0.031)

## 2015-02-08 LAB — COMPREHENSIVE METABOLIC PANEL
ALBUMIN: 3.1 g/dL — AB (ref 3.5–5.0)
ALK PHOS: 71 U/L (ref 38–126)
ALT: 11 U/L — AB (ref 14–54)
AST: 21 U/L (ref 15–41)
Anion gap: 6 (ref 5–15)
BUN: 19 mg/dL (ref 6–20)
CALCIUM: 8.6 mg/dL — AB (ref 8.9–10.3)
CO2: 24 mmol/L (ref 22–32)
CREATININE: 1.18 mg/dL — AB (ref 0.44–1.00)
Chloride: 111 mmol/L (ref 101–111)
GFR calc Af Amer: 46 mL/min — ABNORMAL LOW (ref >60)
GFR calc non Af Amer: 40 mL/min — ABNORMAL LOW (ref >60)
GLUCOSE: 104 mg/dL — AB (ref 65–99)
Potassium: 3.8 mmol/L (ref 3.5–5.1)
SODIUM: 141 mmol/L (ref 135–145)
Total Bilirubin: 0.9 mg/dL (ref 0.3–1.2)
Total Protein: 6 g/dL — ABNORMAL LOW (ref 6.5–8.1)

## 2015-02-08 LAB — PROTIME-INR
INR: 2.59 — ABNORMAL HIGH (ref 0.00–1.49)
Prothrombin Time: 27.4 seconds — ABNORMAL HIGH (ref 11.6–15.2)

## 2015-02-08 MED ORDER — LEVOTHYROXINE SODIUM 75 MCG PO TABS
75.0000 ug | ORAL_TABLET | Freq: Every day | ORAL | Status: DC
  Administered 2015-02-09: 75 ug via ORAL
  Filled 2015-02-08 (×2): qty 1

## 2015-02-08 MED ORDER — ACETAZOLAMIDE SODIUM 500 MG IJ SOLR
500.0000 mg | Freq: Once | INTRAMUSCULAR | Status: AC
  Administered 2015-02-08: 500 mg via INTRAVENOUS
  Filled 2015-02-08: qty 500

## 2015-02-08 NOTE — Progress Notes (Signed)
ANTICOAGULATION CONSULT NOTE - Follow Up Consult  Pharmacy Consult for Coumadin Indication: atrial fibrillation  Allergies  Allergen Reactions  . [name] [Ethacrynic Acid] Nausea And Vomiting  . Tape Other (See Comments)    Tears skin off.  Please use "paper" tape only.  . Furosemide Rash  . Sulfa Antibiotics Itching and Rash  . [name] Rash    Per Dr Sallyanne Kuster, had a skin reaction with thiazides prior to use of Lasix  . Torsemide Rash    Patient Measurements: Height: '5\' 3"'$  (160 cm) Weight: 130 lb 6.4 oz (59.149 kg) (Scale B) IBW/kg (Calculated) : 52.4 Heparin Dosing Weight:   Vital Signs: Temp: 97.7 F (36.5 C) (09/14 1221) Temp Source: Oral (09/14 1221) BP: 120/49 mmHg (09/14 1221) Pulse Rate: 55 (09/14 1221)  Labs:  Recent Labs  02/06/15 1136  02/07/15 0845 02/07/15 1710 02/07/15 2245 02/08/15 0340  HGB  --   --  12.2  --   --   --   HCT  --   --  38.9  --   --   --   PLT  --   --  224  --   --   --   LABPROT  --   --   --   --   --  27.4*  INR 2.5  --   --   --   --  2.59*  CREATININE  --   --  0.98  --   --  1.18*  TROPONINI  --   < > <0.03 <0.03 <0.03 <0.03  < > = values in this interval not displayed.  Estimated Creatinine Clearance: 26.7 mL/min (by C-G formula based on Cr of 1.18).   Medications:  Scheduled:  . atorvastatin  40 mg Oral Daily  . fluticasone  2 spray Each Nare Daily  . folic acid  2 mg Oral Daily  . ipratropium  1 spray Each Nare Daily  . levothyroxine  88 mcg Oral QAC breakfast  . loratadine  10 mg Oral Daily  . metoprolol tartrate  25 mg Oral BID  . multivitamin with minerals  1 tablet Oral Daily  . polyvinyl alcohol  1 drop Both Eyes QHS  . sodium chloride  3 mL Intravenous Q12H  . spironolactone  25 mg Oral Daily  . warfarin  2 mg Oral q1800  . Warfarin - Pharmacist Dosing Inpatient   Does not apply q1800    Assessment: 81yo female with AFib, on home dose of Coumadin '2mg'$  daily.  INR is therapeutic.  No  bleeding noted.  Goal of Therapy:  INR 2-3 Monitor platelets by anticoagulation protocol: Yes   Plan:  Continue Coumadin '2mg'$  daily Daily INR x 3 Change INR to MWF if remains stable Watch for s/s of bleeding   Gracy Bruins, PharmD [facility]

## 2015-02-08 NOTE — Progress Notes (Signed)
  Echocardiogram 2D Echocardiogram has been performed.  [name] 02/08/2015, 12:00 PM

## 2015-02-08 NOTE — Care Management Note (Signed)
Case Management Note  Patient Details  Name: ALTAGRACIA RONE MRN: [id number] Date of Birth: [birth year]  Subjective/Objective:          Admitted with CHF          Action/Plan: Patient lives at home with spouse. Independent of all ADL's, continue to cook, clean around the home. She has scales at home and weighs herself every day. She has private insurance with [facility] with prescription drug coverage - no problem getting her medication. Patient has [facility] [employer] and receives her medication through the mail or she goes to [facility] if she needs her medication sooner. No needs identified. CM will continue to follow for DCP.  Expected Discharge Date:    possibly 02/09/2015              Expected Discharge Plan:  Home/Self Care  Discharge planning Services  CM Consult  Status of Service:  In process, will continue to follow  Sherrilyn Rist [phone] 02/08/2015, 12:29 PM

## 2015-02-08 NOTE — Progress Notes (Signed)
Patient Name: [name] Date of Encounter: 02/08/2015  Principal Problem:   Acute on chronic diastolic CHF (congestive heart failure), NYHA class 3 Active Problems:   HTN (hypertension)   Hypothyroidism   Aortic valvular stenosis, moderate to severe   Pleural effusion, left   Long term current use of anticoagulant therapy   CHF (congestive heart failure)   Persistent atrial fibrillation    Primary Cardiologist: Dr. Sallyanne Kuster Patient Profile: 81 yo female w/ PMH of diastolic CHF, PAF (on coumadin), HTN, HLD, Aortic Stenosis, and CAD (BMS to RCA - 2010) who presented on 02/07/2015 for shortness of breath and admitted for acute on chronic diastolic CHF exacerbation.  SUBJECTIVE: Reports improvement in her breathing since yesterday. Says her heart is not racing like it was yesterday. On dyspnea while taking a bath this morning. Plans to walk up and down the hallway this afternoon to gauge her respiratory status.  OBJECTIVE Filed Vitals:   02/07/15 2103 02/08/15 0004 02/08/15 0527 02/08/15 0750  BP: 139/67 106/59 105/63 99/42  Pulse: 86 70 62 55  Temp: 97.6 F (36.4 C) 97.2 F (36.2 C) 97.5 F (36.4 C) 98.4 F (36.9 C)  TempSrc: Oral Oral Oral Oral  Resp: '18 19 18 18  '$ Height:      Weight:   130 lb 6.4 oz (59.149 kg)   SpO2: 97% 96% 93% 96%    Intake/Output Summary (Last 24 hours) at 02/08/15 1040 Last data filed at 02/08/15 0959  Gross per 24 hour  Intake    600 ml  Output   1400 ml  Net   -800 ml   Filed Weights   02/07/15 0826 02/07/15 1254 02/08/15 0527  Weight: 133 lb (60.328 kg) 131 lb 14.4 oz (59.829 kg) 130 lb 6.4 oz (59.149 kg)    PHYSICAL EXAM General: Well developed, well nourished, female in no acute distress. Head: Normocephalic, atraumatic.  Neck: Supple without bruits, radiation of murmur noted,  JVD elevated to 8cm. Lungs:  Resp regular and unlabored, no wheezing on auscultation. Rales at bases bilaterally. Heart: Irregularly irregular, S1,  S2, no S3, S4, or murmur; no rub. Abdomen: Soft, non-tender, non-distended with normoactive bowel sounds. No hepatomegaly. No rebound/guarding. No obvious abdominal masses. Extremities: No clubbing, cyanosis, or edema. Distal pedal pulses are 2+ bilaterally. Neuro: Alert and oriented X 3. Moves all extremities spontaneously. Psych: Normal affect.   LABS: CBC: Recent Labs  02/07/15 0845  WBC 11.3*  NEUTROABS 6.8  HGB 12.2  HCT 38.9  MCV 95.6  PLT 224   INR: Recent Labs  02/08/15 0340  INR 9.48*   Basic Metabolic Panel: Recent Labs  02/07/15 0845 02/08/15 0340  NA 144 141  K 3.8 3.8  CL 111 111  CO2 25 24  GLUCOSE 97 104*  BUN 20 19  CREATININE 0.98 1.18*  CALCIUM 8.9 8.6*   Liver Function Tests: Recent Labs  02/08/15 0340  AST 21  ALT 11*  ALKPHOS 71  BILITOT 0.9  PROT 6.0*  ALBUMIN 3.1*   Cardiac Enzymes: Recent Labs  02/07/15 1710 02/07/15 2245 02/08/15 0340  TROPONINI <0.03 <0.03 <0.03   BNP:  B NATRIURETIC PEPTIDE  Date/Time Value Ref Range Status  02/07/2015 08:45 AM 279.0* 0.0 - 100.0 pg/mL Final  08/14/2014 06:10 AM 208.2* 0.0 - 100.0 pg/mL Final   Thyroid Function Tests: Recent Labs  02/07/15 1710  TSH 0.342*    TELE: Previously in atrial fibrillation w/ RVR upon arrival with rate in 110's -  120's. Converted to NSR yesterday afternoon. Now rates in 50's- 60's.  ECHO: Pending  Radiology/Studies: Dg Chest 2 View: 02/07/2015   CLINICAL DATA:  Shortness of breath today. Nonsmoker. Initial encounter.  EXAM: CHEST  2 VIEW  COMPARISON:  08/14/2014 and 01/02/2013.  FINDINGS: The heart size and mediastinal contours are stable. There is aortic atherosclerosis, mild cardiomegaly and chronic vascular congestion with increased interstitial prominence from the 2014 examination. Biapical scarring appears unchanged. There is a new loculated pleural effusion posteriorly on the left, best seen on the lateral view. Left shoulder arthroplasty noted. No  acute osseous findings seen.  IMPRESSION: Possible interstitial pulmonary edema with new small loculated pleural effusion on the left.   Electronically Signed   By: Richardean Sale M.D.   On: 02/07/2015 09:35     Current Medications:  . atorvastatin  40 mg Oral Daily  . fluticasone  2 spray Each Nare Daily  . folic acid  2 mg Oral Daily  . ipratropium  1 spray Each Nare Daily  . levothyroxine  88 mcg Oral QAC breakfast  . loratadine  10 mg Oral Daily  . metoprolol tartrate  25 mg Oral BID  . multivitamin with minerals  1 tablet Oral Daily  . polyvinyl alcohol  1 drop Both Eyes QHS  . sodium chloride  3 mL Intravenous Q12H  . spironolactone  25 mg Oral Daily  . spironolactone  25 mg Oral Daily  . warfarin  2 mg Oral q1800  . Warfarin - Pharmacist Dosing Inpatient   Does not apply q1800      ASSESSMENT AND PLAN:  1. Acute on chronic diastolic CHF (congestive heart failure), NYHA class 3 - Net -821m thus far.  - Weight 130 lbs, down from 131lbs on admission - dry weight is unclear since the patients thinks she recently loss muscle mass and gained weight as fluid instead. - unable to take Torsemide and Furosemide secondary to a thickened rash. Pharmacy was consulted for additional options of diuretics for the patient. - home dose of Spironolactone '25mg'$  daily was continued.  Received one dose of Acetazolamide '500mg'$  last night.  - Could consider repeat dose of Acetazolamide '500mg'$  today since the patient responded well to the medication and did not have any allergic reactions. Encouraged the patient to walk up and down the hallway today to see how her breathing is with exertion since she is still pretty active at home.   2. HTN (hypertension) - BP has been 99/42 - 172/82 in the past 24 hours. - continue to monitor. Would not increase BB due to recent bradycardia.  3. Hypothyroidism - Continue Synthroid at 839m daily. TSH low at 0.342 on 02/07/2015 but has improved from .204 three  months ago.   - Appears to be asymptomatic at this time.  4. Aortic valvular stenosis, moderate to severe - new echocardiogram. Previous echo was in 2014.  5. Pleural effusion, left - will continue to monitor. - still has decreased breath sounds at left base.  6. Long term current use of anticoagulant therapy - continue coumadin for anticoagulation of atrial fibrillation.  7. Paroxysmal atrial fibrillation - converted from atrial fibrillation to NSR yesterday afternoon. Rate has been in 50's - 60's since. EKG ordered to confirm. - continue Metoprolol for rate control and Warfarin for anticoagulation. - will continue to monitor HR closely since patient is having some bradycardia while on Metoprolol.  SiArna Medici PA-C 10:40 AM 02/08/2015 Pager: [phone] The patient  was seen, examined and discussed with [place], PA-C and I agree with the above.    81 year old female with h/o moderate to severe AS in 2014 (new echo pending), with HTN, CAD, RCA /PCI in 2010, chronic persistent a-fib on chronic anticoagulation  admitted with acute on chronic diastolic CHF, allergic to sulfa drugs on Spironolactone and acetazolamide. Diuresed 800 cc overnight, we will repeat today. She is mildly improved. Crea 0.98--> 1.18. We will monitor. TSH low, we will decrease synthroid from 88 mcg to 75 mcg.  Dorothy Spark 02/08/2015

## 2015-02-08 NOTE — Research (Signed)
REDS'@Discharge'$  Study Informed Consent   Subject Name: [name]  Subject met inclusion and exclusion criteria.  The informed consent form, study requirements and expectations were reviewed with the subject and questions and concerns were addressed prior to the signing of the consent form.  The subject verbalized understanding of the trail requirements.  The subject agreed to participate in the REDS$RemoveBe'@Discharge'[email or web address]$ * trial and signed the informed consent.  The informed consent was obtained prior to performance of any protocol-specific procedures for the subject.  A copy of the signed informed consent was given to the subject and a copy was placed in the subject's medical record.  Sandie Ano 02/08/2015, 5:00

## 2015-02-09 DIAGNOSIS — I480 Paroxysmal atrial fibrillation: Secondary | ICD-10-CM

## 2015-02-09 DIAGNOSIS — I5033 Acute on chronic diastolic (congestive) heart failure: Secondary | ICD-10-CM | POA: Diagnosis not present

## 2015-02-09 DIAGNOSIS — Z7901 Long term (current) use of anticoagulants: Secondary | ICD-10-CM | POA: Diagnosis not present

## 2015-02-09 DIAGNOSIS — I10 Essential (primary) hypertension: Secondary | ICD-10-CM | POA: Diagnosis not present

## 2015-02-09 DIAGNOSIS — I350 Nonrheumatic aortic (valve) stenosis: Secondary | ICD-10-CM | POA: Diagnosis not present

## 2015-02-09 LAB — BASIC METABOLIC PANEL
Anion gap: 5 (ref 5–15)
BUN: 24 mg/dL — ABNORMAL HIGH (ref 6–20)
CHLORIDE: 113 mmol/L — AB (ref 101–111)
CO2: 23 mmol/L (ref 22–32)
Calcium: 9.3 mg/dL (ref 8.9–10.3)
Creatinine, Ser: 1.16 mg/dL — ABNORMAL HIGH (ref 0.44–1.00)
GFR calc non Af Amer: 40 mL/min — ABNORMAL LOW (ref >60)
GFR, EST AFRICAN AMERICAN: 47 mL/min — AB (ref >60)
Glucose, Bld: 101 mg/dL — ABNORMAL HIGH (ref 65–99)
POTASSIUM: 4.6 mmol/L (ref 3.5–5.1)
SODIUM: 141 mmol/L (ref 135–145)

## 2015-02-09 LAB — PATHOLOGIST SMEAR REVIEW

## 2015-02-09 LAB — PROTIME-INR
INR: 2.33 — AB (ref 0.00–1.49)
PROTHROMBIN TIME: 25.3 s — AB (ref 11.6–15.2)

## 2015-02-09 MED ORDER — NITROGLYCERIN 0.4 MG SL SUBL: 0.4000 mg | SUBLINGUAL_TABLET | SUBLINGUAL | Status: DC | PRN

## 2015-02-09 MED ORDER — LEVOTHYROXINE SODIUM 75 MCG PO TABS: 75.0000 ug | ORAL_TABLET | Freq: Every day | ORAL | Status: DC

## 2015-02-09 NOTE — Discharge Summary (Signed)
Patient is discharged, discharge instructions provided and patient showed understanding to it, pt is following up with PCP in 1 week, pt left the unit in wheelchair in a stable condition with her belongings accompanied by husband.

## 2015-02-09 NOTE — Discharge Instructions (Signed)
Avoid salty food. Limit amount of fluid intake to <2L per day  Your discharge dry weight is 131, please call cardiology if weight increase by more than 3 lbs overnight or 5 lbs in a week

## 2015-02-09 NOTE — Progress Notes (Signed)
ReDS Vest Discharge Study  Your patient is in the Blinded arm of the Vest at Discharge study.  Your patient has had a ReDS Vest reading and the reading has been transmitted to the cloud.  Your patient is ok for discharge.    Thank You   The research team   

## 2015-02-09 NOTE — Progress Notes (Signed)
Patient Name: [name] Date of Encounter: 02/09/2015  Principal Problem:   Acute on chronic diastolic CHF (congestive heart failure), NYHA class 3 Active Problems:   HTN (hypertension)   Hypothyroidism   Aortic valvular stenosis, moderate to severe   Pleural effusion, left   Long term current use of anticoagulant therapy   CHF (congestive heart failure)   Paroxysmal atrial fibrillation  Primary Cardiologist: Dr. Sallyanne Kuster Patient Profile: 81 yo female w/ PMH of diastolic CHF, PAF (on coumadin), HTN, HLD, Aortic Stenosis, and CAD (BMS to RCA - 2010) who presented on 02/07/2015 for shortness of breath and admitted for acute on chronic diastolic CHF exacerbation.  SUBJECTIVE  Feeling well. No chest pain, sob or palpitations. She hasn't walked today. Up and move around.   CURRENT MEDS . atorvastatin  40 mg Oral Daily  . fluticasone  2 spray Each Nare Daily  . folic acid  2 mg Oral Daily  . ipratropium  1 spray Each Nare Daily  . levothyroxine  75 mcg Oral QAC breakfast  . loratadine  10 mg Oral Daily  . metoprolol tartrate  25 mg Oral BID  . multivitamin with minerals  1 tablet Oral Daily  . polyvinyl alcohol  1 drop Both Eyes QHS  . sodium chloride  3 mL Intravenous Q12H  . spironolactone  25 mg Oral Daily  . warfarin  2 mg Oral q1800  . Warfarin - Pharmacist Dosing Inpatient   Does not apply q1800    OBJECTIVE  Filed Vitals:   02/08/15 2236 02/09/15 0405 02/09/15 0958 02/09/15 1126  BP:  131/47 105/38 103/44  Pulse: 64 61 66 58  Temp:  97.8 F (36.6 C)  97.5 F (36.4 C)  TempSrc:  Oral  Oral  Resp:  18    Height:      Weight:  131 lb 6.4 oz (59.603 kg)    SpO2:  96%  98%    Intake/Output Summary (Last 24 hours) at 02/09/15 1229 Last data filed at 02/09/15 0932  Gross per 24 hour  Intake   1020 ml  Output   1800 ml  Net   -780 ml   Filed Weights   02/07/15 1254 02/08/15 0527 02/09/15 0405  Weight: 131 lb 14.4 oz (59.829 kg) 130 lb 6.4 oz (59.149 kg)  131 lb 6.4 oz (59.603 kg)    PHYSICAL EXAM  General: Pleasant, NAD. Neuro: Alert and oriented X 3. Moves all extremities spontaneously. Psych: Normal affect. HEENT:  Normal  Neck: Supple without bruits or JVD. Lungs:  Resp regular and unlabored, CTA. Heart: RRR no s3, s4, or murmurs. Abdomen: Soft, non-tender, non-distended, BS + x 4.  Extremities: No clubbing, cyanosis or edema. DP/PT/Radials 2+ and equal bilaterally.  Accessory Clinical Findings  CBC  Recent Labs  02/07/15 0845  WBC 11.3*  NEUTROABS 6.8  HGB 12.2  HCT 38.9  MCV 95.6  PLT 211   Basic Metabolic Panel  Recent Labs  02/07/15 0845 02/08/15 0340  NA 144 141  K 3.8 3.8  CL 111 111  CO2 25 24  GLUCOSE 97 104*  BUN 20 19  CREATININE 0.98 1.18*  CALCIUM 8.9 8.6*   Liver Function Tests  Recent Labs  02/08/15 0340  AST 21  ALT 11*  ALKPHOS 71  BILITOT 0.9  PROT 6.0*  ALBUMIN 3.1*   Cardiac Enzymes  Recent Labs  02/07/15 1710 02/07/15 2245 02/08/15 0340  TROPONINI <0.03 <0.03 <0.03   Thyroid Function Tests  Recent  Labs  02/07/15 1710  TSH 0.342*    TELE  NSR at rate of 50s with 1st degree AV block.   Radiology/Studies  Dg Chest 2 View  02/07/2015   CLINICAL DATA:  Shortness of breath today. Nonsmoker. Initial encounter.  EXAM: CHEST  2 VIEW  COMPARISON:  08/14/2014 and 01/02/2013.  FINDINGS: The heart size and mediastinal contours are stable. There is aortic atherosclerosis, mild cardiomegaly and chronic vascular congestion with increased interstitial prominence from the 2014 examination. Biapical scarring appears unchanged. There is a new loculated pleural effusion posteriorly on the left, best seen on the lateral view. Left shoulder arthroplasty noted. No acute osseous findings seen.  IMPRESSION: Possible interstitial pulmonary edema with new small loculated pleural effusion on the left.   Electronically Signed   By: Richardean Sale M.D.   On: 02/07/2015 09:35    Echo  02/08/15 LV EF: 55% -  60%  ------------------------------------------------------------------- Indications:   CHF - 428.0.  ------------------------------------------------------------------- History:  PMH:  Coronary artery disease. Risk factors: Hypertension. Dyslipidemia.  ------------------------------------------------------------------- Study Conclusions  - Left ventricle: The cavity size was normal. Wall thickness was normal. Systolic function was normal. The estimated ejection fraction was in the range of 55% to 60%. - Aortic valve: There was moderate stenosis. There was trivial regurgitation. Valve area (VTI): 1.1 cm^2. Valve area (Vmax): 1.05 cm^2. Valve area (Vmean): 1.08 cm^2. - Mitral valve: Calcified annulus. Mildly thickened leaflets . There was mild regurgitation. Valve area by continuity equation (using LVOT flow): 1.53 cm^2. - Left atrium: The atrium was mildly dilated. - Right atrium: The atrium was mildly dilated. - Atrial septum: No defect or patent foramen ovale was identified.   ASSESSMENT AND PLAN    1. Acute on chronic diastolic CHF (congestive heart failure), NYHA class 3 - Net I/O negative 1.1L 824m and weight down 2lb (133->131lb). Dry weight is unclear since the patients thinks she recently loss muscle mass and gained weight as fluid instead. - unable to take Torsemide and Furosemide secondary to a thickened rash. Pharmacy was consulted for additional options of diuretics for the patient. - Tolerating Spironolactone '25mg'$  daily. Received two dose of Acetazolamide '500mg'$ . Creatinine was up yesterday to 1.18. No labs were done today. Will get BMET today.  - Appears euvolemic. Ambulate today. Likely discharge tomorrow vs later today. Discharge meds per MD.  2. HTN (hypertension) - BP running soft low.  - continue to monitor. Would not increase BB due to recent bradycardia.  3. Hypothyroidism - Synthroid reduced to 738m daily as TSH  low at 0.342 on 02/07/2015. F/u with PCP.  - Appears to be asymptomatic at this time.  4. Aortic valvular stenosis, moderate to severe - Echo 02/08/15 is reassuring with LV EF of 55-60%, moderate aortic stenosis (same as previous echo 2014), mild mitral regur using LVOT flow: 1.53 cm^2, mild dilated LA and RA.   5. Pleural effusion, left - will continue to monitor.  6. Long term current use of anticoagulant therapy - continue coumadin for anticoagulation of atrial fibrillation.  7. Paroxysmal atrial fibrillation -Maintaining NSR. Rate has been in 50's - 60's with 1st degree AV block, PR interval of 26823m - Continue Metoprolol for rate control and Warfarin for anticoagulation. - Consider reducing dose that also helps blood pressure.   Signed, Bhagat,Bhavinkumar PA-C   The patient was seen, examined and discussed with Bhagat,Bhavinkumar PA-C and I agree with the above.   [age]ar old female with h/o moderate stenosis, with HTN, CAD, RCA /  PCI in 2010, chronic persistent a-fib on chronic anticoagulation admitted with acute on chronic diastolic CHF, allergic to sulfa drugs on Spironolactone and acetazolamide. Diuresed 1500 cc in the last 48 hours. Weight 131 lbs, back May 142 lbs.  She feels better. Crea 0.98 --> 1.18, none today. TSH low, we decreased synthroid from 88 mcg to 75 mcg. She appears euvolemic, she was consented yesterday to be included in HF trial REDS using a vest for monitoring HF.  She will be discharge home on her prir regimen, if additional diuresis needed, spironolactone can be increased to BID.  Dorothy Spark 02/09/2015

## 2015-02-09 NOTE — Progress Notes (Signed)
Heart Failure Navigator Consult Note  Presentation: [name] is a 81 y.o. female with a history of D-CHF, PAF on coumadin, HTN, HL, AS, and BMS RCA 2010.   She was seen in the office 12/29/2014 by Dr Sallyanne Kuster and was doing well. PAF is managed by rate control and warfarin. No sig CAD symptoms. Volume status OK but cannot take sulfa drugs, ?thiazides or ethacrynic acid. On Spirononlactone. Weight 133 lbs.   [name] has been doing well since then and has had no dietary indiscretions and no problems with weight gain or new edema.  Early this a.m., she was awakened by shortness of breath. She could not get back to sleep. She could not lie down. She denies lower extremity edema. She had no chest pain or palpitations. When her symptoms do not quickly resolve, she went on appointment center. At the [facility], she was in rapid atrial fibrillation, and felt to have volume overload as well. Her chest x-ray was abnormal and her BNP was elevated. Because of her medication allergies, she was not given diuretic. She was given metoprolol for rate control and transferred to [facility] for further evaluation and treatment.  Mrs. [name] currently feels that she is doing well, but does note a small amount orthopnea and states that she got a little short of breath walking back and forth to the bathroom. She checks her weight daily and her blood pressure, writing both down. She has not gained any weight. She denies dietary indiscretions. She has no significant palpitations but is occasionally aware of the irregular heartbeat. She currently does not feel very much different from baseline.   Past Medical History  Diagnosis Date  . Chronic diastolic CHF (congestive heart failure)     a. 10/2012 Echo: EF 55-60%, no rwma, Gr 2 DD, moderate AS, mod dil LA, mildly to mod dil RA.  Marland Kitchen Hypothyroidism   . Dyslipidemia   . Hypertension   . PAF (paroxysmal atrial fibrillation)     a. CHA2DS2VASc = 7-->chronic  coumadin;  b. s/p DCCV 05/2011, 05/2012, 07/2014;  c. On amio.  Marland Kitchen  Acute respiratiory failure requiring BiPap 06/24/2011  . Aortic valvular stenosis, moderate to severe 06/24/2011    a. 10/2012 Echo: mod AS, Valve 1.25 cm^2 (VTI), 1.19cm^2 (Vmax).  . Allergy   . Coronary artery disease     a. 03/2009 PCI RCA (3.0x18 BMS).  . Exertional shortness of breath   . Arthritis     "back" (10/30/2012)  . Rash     a. felt to be 2/2 lasix/torsemide in setting of sulfa allergy (lasix d/c ~ 06/2014, torsemide d/c 08/09/2014).    Social History   Social History  . Marital Status: Married    Spouse Name: N/A  . Number of Children: 1  . Years of Education: N/A   Occupational History  . Retired [employer]   Social History Main Topics  . Smoking status: Never Smoker   . Smokeless tobacco: Never Used  . Alcohol Use: No  . Drug Use: No  . Sexual Activity: No   Other Topics Concern  . None   Social History Narrative   Lives in [place], [place]. She got married in 1948.    ECHO:--02/08/15 Study Conclusions  - Left ventricle: The cavity size was normal. Wall thickness was normal. Systolic function was normal. The estimated ejection fraction was in the range of 55% to 60%. - Aortic valve: There was moderate stenosis. There was trivial regurgitation. Valve area (VTI):  1.1 cm^2. Valve area (Vmax): 1.05 cm^2. Valve area (Vmean): 1.08 cm^2. - Mitral valve: Calcified annulus. Mildly thickened leaflets . There was mild regurgitation. Valve area by continuity equation (using LVOT flow): 1.53 cm^2. - Left atrium: The atrium was mildly dilated. - Right atrium: The atrium was mildly dilated. - Atrial septum: No defect or patent foramen ovale was identified.  Transthoracic echocardiography. M-mode, complete 2D, spectral Doppler, and color Doppler. Birthdate: Patient birthdate: Dec 20, 1923. Age: Patient is 81 yr old. Sex: Gender: female. BMI: 23.2 kg/m^2. Blood pressure:   99/42  Patient status: Inpatient. Study date: Study date: 02/08/2015. Study time: 11:18 AM. Location: Bedside. BNP    Component Value Date/Time   BNP 279.0* 02/07/2015 0845    ProBNP    Component Value Date/Time   PROBNP 1345.0* 11/05/2012 0620     Education Assessment and Provision:  Detailed education and instructions provided on heart failure disease management including the following:  Signs and symptoms of Heart Failure When to call the physician Importance of daily weights Low sodium diet Fluid restriction Medication management Anticipated future follow-up appointments  Patient education given on each of the above topics.  Patient acknowledges understanding and acceptance of all instructions.  I spoke with Ms. Littrell and her husband regarding her HF.  She weighs daily and can teach back when to call physician.  We also discussed signs and symptoms of HF and she described contacting the physician with those symptoms before this admission--however says she did not experience a weight increase.  I reinforced a low sodium diet and high sodium foods to avoid.  She asked pertinent questions.  I encouraged her to call me with any questions or concerns regarding HF after discharge.  She will follow with [facility] after discharge.  Education Materials:  "Living Better With Heart Failure" Booklet, Daily Weight Tracker Tool   High Risk Criteria for Readmission and/or Poor Patient Outcomes:   EF <30%- No  2 or more admissions in 6 months- 2/6  Difficult social situation- No  Demonstrates medication noncompliance- No,however is allergic to Lasix and Demadex    Barriers of Care:  Knowledge and compliance.  Discharge Planning:   Plan to return home with husband

## 2015-02-09 NOTE — Progress Notes (Signed)
ANTICOAGULATION CONSULT NOTE - Follow Up Consult  Pharmacy Consult for Coumadin Indication: atrial fibrillation  Allergies  Allergen Reactions  . [name] [Ethacrynic Acid] Nausea And Vomiting  . Tape Other (See Comments)    Tears skin off.  Please use "paper" tape only.  . Furosemide Rash  . Sulfa Antibiotics Itching and Rash  . [name] Rash    Per Dr Sallyanne Kuster, had a skin reaction with thiazides prior to use of Lasix  . Torsemide Rash    Patient Measurements: Height: '5\' 3"'$  (160 cm) Weight: 131 lb 6.4 oz (59.603 kg) IBW/kg (Calculated) : 52.4  Vital Signs: Temp: 97.8 F (36.6 C) (09/15 0405) Temp Source: Oral (09/15 0405) BP: 131/47 mmHg (09/15 0405) Pulse Rate: 61 (09/15 0405)  Labs:  Recent Labs  02/06/15 1136  02/07/15 0845 02/07/15 1710 02/07/15 2245 02/08/15 0340 02/09/15 0357  HGB  --   --  12.2  --   --   --   --   HCT  --   --  38.9  --   --   --   --   PLT  --   --  224  --   --   --   --   LABPROT  --   --   --   --   --  27.4* 25.3*  INR 2.5  --   --   --   --  2.59* 2.33*  CREATININE  --   --  0.98  --   --  1.18*  --   TROPONINI  --   < > <0.03 <0.03 <0.03 <0.03  --   < > = values in this interval not displayed.  Estimated Creatinine Clearance: 26.7 mL/min (by C-G formula based on Cr of 1.18).   Assessment: 81 yo F with CoumRx for AFib.  Home dose '2mg'$  daily.  INR has been stable on PTA dose and remains therapeutic today at 2.33. CBC stable on 9/13. No bleeding  Goal of Therapy:  INR 2-3 Monitor platelets by anticoagulation protocol: Yes   Plan:  Coumadin '2mg'$  daily Monitor daily INR, CBC, s/s of bleed  Zita Ozimek J 02/09/2015,9:58 AM

## 2015-02-09 NOTE — Discharge Summary (Signed)
Discharge Summary   Patient ID: [name],  MRN: [id number], DOB/AGE: 79-Apr-1925 79 y.o.  Admit date: 02/07/2015 Discharge date: 02/09/2015  Primary Care Provider: Wyatt Haste Primary Cardiologist: Dr. Sallyanne Kuster  Discharge Diagnoses Principal Problem:   Acute on chronic diastolic CHF (congestive heart failure), NYHA class 3 Active Problems:   HTN (hypertension)   Hypothyroidism   Aortic valvular stenosis, moderate to severe   Pleural effusion, left   Long term current use of anticoagulant therapy   CHF (congestive heart failure)   PAF (paroxysmal atrial fibrillation)   Allergies Allergies  Allergen Reactions  . [name] [Ethacrynic Acid] Nausea And Vomiting  . Tape Other (See Comments)    Tears skin off.  Please use "paper" tape only.  . Furosemide Rash  . Sulfa Antibiotics Itching and Rash  . [name] Rash    Per Dr Sallyanne Kuster, had a skin reaction with thiazides prior to use of Lasix  . Torsemide Rash    Procedures  Echocardiogram 02/08/2015 LV EF: 55% -  60%  ------------------------------------------------------------------- Indications:   CHF - 428.0.  ------------------------------------------------------------------- History:  PMH:  Coronary artery disease. Risk factors: Hypertension. Dyslipidemia.  ------------------------------------------------------------------- Study Conclusions  - Left ventricle: The cavity size was normal. Wall thickness was normal. Systolic function was normal. The estimated ejection fraction was in the range of 55% to 60%. - Aortic valve: There was moderate stenosis. There was trivial regurgitation. Valve area (VTI): 1.1 cm^2. Valve area (Vmax): 1.05 cm^2. Valve area (Vmean): 1.08 cm^2. - Mitral valve: Calcified annulus. Mildly thickened leaflets . There was mild regurgitation. Valve area by continuity equation (using LVOT flow): 1.53 cm^2. - Left atrium: The atrium was mildly  dilated. - Right atrium: The atrium was mildly dilated. - Atrial septum: No defect or patent foramen ovale was identified.   Hospital Course  The patient is a 81 year old female with history of diastolic heart failure, PAF on Coumadin, HTN, HLD, AS, and CAD s/p BMS to RCA 2010. She was last seen in the office on 12/29/2014, at which time she was doing well. Her PAF was managed by rate control and Coumadin. She has been doing well since then, she has no dietary discretion and no problem with weight gain or new edema. She presented to [facility] on 02/07/2015 when she awakened by shortness of breath. He could not go back to sleep, she could not lie down. She denies any lower extremity edema. She had no chest pain or palpitation. She presented to [facility], she was in rapid atrial fibrillation and felt to have volume overload as well. Her chest x-ray was abnormal, and her BNP was elevated. Because her medication allergies including furosemide, she was not given diuretic. She was given metoprolol for rate control and transferred to [facility] for further evaluation.   On arrival it was felt she was in acute on chronic diastolic heart failure. She was given acetazolamide 500 mg with good response and no allergic reaction. Overnight, she converted to NSR. Her TSH is low, her Synthroid was decreased from 88 g to 75 g. Echocardiogram was repeated on 9/14 which showed EF 55-60%, moderate AF, mild MR. During this admission, patient was enrolled in REDS'@Discharge'$  Study. she was seen on 02/09/2015, she appears to be euvolemic on exam. Her discharge dry weight is 131 pounds. She has diuresed a total of 1500 mL in the last 48 hours. She will be discharged home on her prior regimen, if additional diuresis needed, spironolactone could be increased  to BID dosing.   Discharge Vitals Blood pressure 103/44, pulse 58, temperature 97.5 F (36.4 C), temperature source Oral, resp. rate 18, height '5\' 3"'$   (1.6 m), weight 131 lb 6.4 oz (59.603 kg), SpO2 98 %.  Filed Weights   02/07/15 1254 02/08/15 0527 02/09/15 0405  Weight: 131 lb 14.4 oz (59.829 kg) 130 lb 6.4 oz (59.149 kg) 131 lb 6.4 oz (59.603 kg)    Labs  CBC  Recent Labs  02/07/15 0845  WBC 11.3*  NEUTROABS 6.8  HGB 12.2  HCT 38.9  MCV 95.6  PLT 400   Basic Metabolic Panel  Recent Labs  02/08/15 0340 02/09/15 1405  NA 141 141  K 3.8 4.6  CL 111 113*  CO2 24 23  GLUCOSE 104* 101*  BUN 19 24*  CREATININE 1.18* 1.16*  CALCIUM 8.6* 9.3   Liver Function Tests  Recent Labs  02/08/15 0340  AST 21  ALT 11*  ALKPHOS 71  BILITOT 0.9  PROT 6.0*  ALBUMIN 3.1*   Cardiac Enzymes  Recent Labs  02/07/15 1710 02/07/15 2245 02/08/15 0340  TROPONINI <0.03 <0.03 <0.03   Thyroid Function Tests  Recent Labs  02/07/15 1710  TSH 0.342*    Disposition  Pt is being discharged home today in good condition.  Follow-up Plans & Appointments      Follow-up Information    Follow up with HAGER, BRYAN, PA-C On 03/02/2015.   Specialties:  Physician Assistant, Radiology, Interventional Cardiology   Why:  10:00am   Contact information:   [address] [place] [address] [phone]       Discharge Medications    Medication List    TAKE these medications        acetaminophen 650 MG CR tablet  Commonly known as:  TYLENOL  Take 650 mg by mouth 3 (three) times daily.     albuterol 108 (90 BASE) MCG/ACT inhaler  Commonly known as:  PROVENTIL HFA;VENTOLIN HFA  Inhale 2 puffs into the lungs every 6 (six) hours as needed for wheezing or shortness of breath.     atorvastatin 40 MG tablet  Commonly known as:  LIPITOR  TAKE 1 TABLET DAILY     CENTRUM SILVER tablet  Take 1 tablet by mouth daily.     fluticasone 50 MCG/ACT nasal spray  Commonly known as:  FLONASE  Place 2 sprays into both nostrils daily. Use as directed     folic acid 1 MG tablet  Commonly known as:  FOLVITE  Take 2  tablets (2 mg total) by mouth daily.     ipratropium 0.03 % nasal spray  Commonly known as:  ATROVENT  Place 2 sprays into both nostrils daily. Use as directed     levothyroxine 75 MCG tablet  Commonly known as:  SYNTHROID, LEVOTHROID  Take 1 tablet (75 mcg total) by mouth daily before breakfast.     loratadine 10 MG tablet  Commonly known as:  CLARITIN  Take 10 mg by mouth daily.     metoprolol tartrate 25 MG tablet  Commonly known as:  LOPRESSOR  Take 1 tablet (25 mg total) by mouth 2 (two) times daily.     nitroGLYCERIN 0.4 MG SL tablet  Commonly known as:  NITROSTAT  Place 1 tablet (0.4 mg total) under the tongue every 5 (five) minutes x 3 doses as needed for chest pain.     spironolactone 25 MG tablet  Commonly known as:  ALDACTONE  Take 1 tablet (25  mg total) by mouth daily.     TEARS PLUS OP  Place 1 drop into both eyes at bedtime.     warfarin 1 MG tablet  Commonly known as:  COUMADIN  Take 2 tablets by mouth daily or as directed by coumadin clinic        Duration of Discharge Encounter   Greater than 30 minutes including physician time.  Hilbert Corrigan PA-C Pager: [phone] 02/09/2015, 4:09 PM

## 2015-02-10 ENCOUNTER — Telehealth: Payer: Self-pay | Admitting: Cardiology

## 2015-02-10 NOTE — Telephone Encounter (Signed)
D/C phone Call .Marland Kitchen Appt is 03/02/15 at 10am w/ Tarri Fuller PA at [facility] office  Thanks

## 2015-02-10 NOTE — Telephone Encounter (Signed)
Patient contacted regarding discharge from [facility] on 02/09/2015.  Patient understands to follow up with Burnard Enis Tarri Fuller on 03/02/2015 at 10:00 am at [facility] office. Patient understands discharge instructions? Yes Patient understands medications and regiment? Yes Patient understands to bring all medications to this visit? Yes

## 2015-02-14 ENCOUNTER — Telehealth (HOSPITAL_COMMUNITY): Payer: Self-pay | Admitting: Surgery

## 2015-02-14 NOTE — Telephone Encounter (Signed)
Heart Failure Nurse Navigator Post Discharge Telephone Call  I called to check on Mrs. [name] after her recent hospitalization.  She tells me that things have been "fine" since leaving the hospital.  She still feels a "little weak"--yet was able to go to the grocery store yesterday.  She has been weighing daily and weight today was 130 lbs versus discharge weight of 131.4 lbs on 02/09/15.  She does say that her HR has been running in the 80's and she can "feel it".  I told her that if she becomes uncomfortable or SOB to let [facility] physician know.  She denies any issue with getting or taking prescribed medications.  She will follow-up with [facility] on 03/02/15 with Luisa Dago PA.  I have encouraged her to call me with any concerns or questions related to her HF.

## 2015-02-20 ENCOUNTER — Other Ambulatory Visit (INDEPENDENT_AMBULATORY_CARE_PROVIDER_SITE_OTHER): Payer: Medicare Other

## 2015-02-20 DIAGNOSIS — Z23 Encounter for immunization: Secondary | ICD-10-CM

## 2015-02-26 ENCOUNTER — Telehealth: Payer: Self-pay | Admitting: Cardiology

## 2015-02-26 NOTE — Telephone Encounter (Signed)
Pt calls with HR 100 to 118.  She just doesn't feel well, no SOB or chest pain.  Wt is stable.  BP 11/65 per pt.  I had her take an extra half lopressor, should be 12.5 mg.  But she has INR checked tomorrow so have asked Erasmo Downer to arrange an EKG.  Pt agreeable with this plan.

## 2015-02-27 ENCOUNTER — Ambulatory Visit (INDEPENDENT_AMBULATORY_CARE_PROVIDER_SITE_OTHER): Payer: Medicare Other | Admitting: Pharmacist Clinician (PhC)/ Clinical Pharmacy Specialist

## 2015-02-27 DIAGNOSIS — I5033 Acute on chronic diastolic (congestive) heart failure: Secondary | ICD-10-CM

## 2015-02-27 DIAGNOSIS — Z7901 Long term (current) use of anticoagulants: Secondary | ICD-10-CM

## 2015-02-27 DIAGNOSIS — I10 Essential (primary) hypertension: Secondary | ICD-10-CM

## 2015-02-27 DIAGNOSIS — I480 Paroxysmal atrial fibrillation: Secondary | ICD-10-CM

## 2015-02-27 LAB — POCT INR: INR: 2

## 2015-02-28 ENCOUNTER — Telehealth: Payer: Self-pay

## 2015-02-28 ENCOUNTER — Other Ambulatory Visit: Payer: Self-pay

## 2015-02-28 ENCOUNTER — Other Ambulatory Visit: Payer: Self-pay | Admitting: Family Medicine

## 2015-02-28 DIAGNOSIS — N63 Unspecified lump in unspecified breast: Secondary | ICD-10-CM

## 2015-02-28 NOTE — Telephone Encounter (Signed)
PT HAS APPOINTMENT WITH OCT12 '@12'$ :50 ARRIVE AT 12:30

## 2015-02-28 NOTE — Telephone Encounter (Signed)
Called to find out if pt would mind going some where to get a second opion on the mammogram pt said she would leave that up to [facility]. So what do you want to order for her please advise

## 2015-02-28 NOTE — Telephone Encounter (Signed)
This would probably fit under the diagnostic mammogram for evaluation based on the last mammogram. Send a copy of that to the breast center

## 2015-03-01 ENCOUNTER — Encounter: Payer: Self-pay | Admitting: Family Medicine

## 2015-03-02 ENCOUNTER — Encounter: Payer: Self-pay | Admitting: Physician Assistant

## 2015-03-02 ENCOUNTER — Ambulatory Visit (INDEPENDENT_AMBULATORY_CARE_PROVIDER_SITE_OTHER): Payer: Medicare Other | Admitting: Physician Assistant

## 2015-03-02 VITALS — BP 104/78 | HR 110 | Ht 63.0 in | Wt 133.9 lb

## 2015-03-02 DIAGNOSIS — I480 Paroxysmal atrial fibrillation: Secondary | ICD-10-CM

## 2015-03-02 MED ORDER — METOPROLOL TARTRATE 25 MG PO TABS: 25.0000 mg | ORAL_TABLET | Freq: Two times a day (BID) | ORAL | Status: DC

## 2015-03-02 NOTE — Patient Instructions (Signed)
Your physician has recommended you make the following change in your medication: INCREASE METOPROLOL TO 25 MG TWICE DAILY.  KEEP YOUR APPOINTMENT WITH DR. Sallyanne Kuster  05/01/2015 11AM.

## 2015-03-02 NOTE — Progress Notes (Signed)
Patient ID: [name], female   DOB: 30-Nov-1923, 81 y.o.   MRN: [id number]    Date:  03/02/2015   ID:  [name], DOB 05-19-1924, MRN [id number]  PCP:  Wyatt Haste, MD  Primary Cardiologist:  [place]   Chief Complaint  Patient presents with  . Follow-up    post hosp//pt states her heart is racing  . Edema    right ankle     History of Present Illness: [name] is a 81 y.o. female with history of diastolic heart failure, PAF on Coumadin, HTN, HLD, AS, and CAD s/p BMS to RCA 2010. She was last seen in the office on 12/29/2014, at which time she was doing well. Her PAF was managed by rate control and Coumadin.   She presented to [facility] 02/07/2015, she was in rapid atrial fibrillation and felt to have volume overload as well. Her chest x-ray was abnormal, and her BNP was elevated. Because her medication allergies including furosemide, she was not given diuretic. She was given metoprolol for rate control and transferred to [facility] for further evaluation.   On arrival it was felt she was in acute on chronic diastolic heart failure. She was given acetazolamide 500 mg with good response and no allergic reaction. Overnight, she converted to NSR. Her TSH is low, her Synthroid was decreased from 88 g to 75 g. Echocardiogram was repeated on 9/14 which showed EF 55-60%, moderate AF, mild MR. During this admission, patient was enrolled in REDS'@Discharge'$  Study.  She has diuresed a total of 1500 mL in the last 48 hours.   The patient presents for posthospital follow-up. She reports feeling tired at times but does feel  better since taking an extra 12 and half of metoprolol.   She was actually discharged on 25 mg of metoprolol twice a day, but for some reason had not been communicated.  She reports her weight is stable.   She denies shortness of breath orthopnea.   Patient brought in the couple weeks worth of blood pressures and heart rate for the most  Part both  were well controlled.   She did have some mild hypotension in heart rates ranged from 60s to 110's.   Her heart rate today is 110  The patient currently denies nausea, vomiting, fever, chest pain, shortness of breath, orthopnea, dizziness, PND, cough, congestion, abdominal pain, hematochezia, melena, lower extremity edema, claudication.  Her discharge dry weight is 131 pounds. Wt Readings from Last 3 Encounters:  03/02/15 60.737 kg (133 lb 14.4 oz)  02/09/15 59.603 kg (131 lb 6.4 oz)  12/28/14 60.328 kg (133 lb)     Past Medical History  Diagnosis Date  . Chronic diastolic CHF (congestive heart failure)     a. 10/2012 Echo: EF 55-60%, no rwma, Gr 2 DD, moderate AS, mod dil LA, mildly to mod dil RA.  Marland Kitchen Hypothyroidism   . Dyslipidemia   . Hypertension   . PAF (paroxysmal atrial fibrillation)     a. CHA2DS2VASc = 7-->chronic coumadin;  b. s/p DCCV 05/2011, 05/2012, 07/2014;  c. On amio.  Marland Kitchen  Acute respiratiory failure requiring BiPap 06/24/2011  . Aortic valvular stenosis, moderate to severe 06/24/2011    a. 10/2012 Echo: mod AS, Valve 1.25 cm^2 (VTI), 1.19cm^2 (Vmax).  . Allergy   . Coronary artery disease     a. 03/2009 PCI RCA (3.0x18 BMS).  . Exertional shortness of breath   . Arthritis     "back" (10/30/2012)  .  Rash     a. felt to be 2/2 lasix/torsemide in setting of sulfa allergy (lasix d/c ~ 06/2014, torsemide d/c 08/09/2014).    Current Outpatient Prescriptions  Medication Sig Dispense Refill  . acetaminophen (TYLENOL) 650 MG CR tablet Take 650 mg by mouth 3 (three) times daily.     Marland Kitchen albuterol (PROVENTIL HFA;VENTOLIN HFA) 108 (90 BASE) MCG/ACT inhaler Inhale 2 puffs into the lungs every 6 (six) hours as needed for wheezing or shortness of breath.    Marland Kitchen atorvastatin (LIPITOR) 40 MG tablet TAKE 1 TABLET DAILY (Patient taking differently: TAKE 1 TABLET at supper time.) 90 tablet 3  . fluticasone (FLONASE) 50 MCG/ACT nasal spray Place 2 sprays into both nostrils daily. Use as directed      . folic acid (FOLVITE) 1 MG tablet Take 2 tablets (2 mg total) by mouth daily. 180 tablet 1  . ipratropium (ATROVENT) 0.03 % nasal spray Place 2 sprays into both nostrils daily. Use as directed    . levothyroxine (SYNTHROID, LEVOTHROID) 75 MCG tablet Take 1 tablet (75 mcg total) by mouth daily before breakfast. 30 tablet 2  . loratadine (CLARITIN) 10 MG tablet Take 10 mg by mouth daily.    . metoprolol tartrate (LOPRESSOR) 25 MG tablet Take 1 tablet (25 mg total) by mouth 2 (two) times daily. (Patient taking differently: Take 12.5 mg by mouth 3 (three) times daily. ) 60 tablet 6  . Multiple Vitamins-Minerals (CENTRUM SILVER) tablet Take 1 tablet by mouth daily.      . Polyvinyl Alcohol-Povidone (TEARS PLUS OP) Place 1 drop into both eyes at bedtime.     Marland Kitchen spironolactone (ALDACTONE) 25 MG tablet Take 1 tablet (25 mg total) by mouth daily. 90 tablet 1  . warfarin (COUMADIN) 1 MG tablet Take 2 tablets by mouth daily or as directed by coumadin clinic (Patient taking differently: Take 2 mg by mouth daily at 6 PM. ) 180 tablet 1   No current facility-administered medications for this visit.    Allergies:    Allergies  Allergen Reactions  . [name] [Ethacrynic Acid] Nausea And Vomiting  . Tape Other (See Comments)    Tears skin off.  Please use "paper" tape only.  . Furosemide Rash  . Sulfa Antibiotics Itching and Rash  . [name] Rash    Per Dr Sallyanne Kuster, had a skin reaction with thiazides prior to use of Lasix  . Torsemide Rash    Social History:  The patient  reports that she has never smoked. She has never used smokeless tobacco. She reports that she does not drink alcohol or use illicit drugs.   Family history:   Family History  Problem Relation Age of Onset  . Heart disease Brother   . Heart disease Brother   . Lung disease Brother   . Heart disease Mother 14  . Cancer Mother 21    Uterine  . Stroke Mother 64  . Heart disease Father   . Hypertension Child 34  .  Alcohol abuse Child   . Heart attack Father   . Heart failure Mother   . Cancer Son   . Hypertension Mother   . Hypertension Father   . Hypertension Sister   . Hypertension Brother     ROS:  Please see the history of present illness.  All other systems reviewed and negative.   PHYSICAL EXAM: VS:  BP 104/78 mmHg  Pulse 110  Ht '5\' 3"'$  (1.6 m)  Wt 60.737 kg (133 lb 14.4 oz)  BMI 23.73 kg/m2 Well nourished, well developed, in no acute distress HEENT: Pupils are equal round react to light accommodation extraocular movements are intact.  Neck: no JVDNo cervical lymphadenopathy. Cardiac:   irregular rate and rhythm 1/6 systolic murmur Lungs:  clear to auscultation bilaterally, no wheezing, rhonchi or rales Abd: soft, nontender, positive bowel sounds all quadrants, no hepatosplenomegaly Ext: no lower extremity edema.  2+ radial and dorsalis pedis pulses. Skin: warm and dry Neuro:  Grossly normal  EKG:   Atrial fibrillation rate 110  ASSESSMENT AND PLAN:     paroxysmalatrial fibrillation:  The patient seems to feel better with the extra metoprolol. We'll try and increase that to the actual 25 twice a day.   She has follow-up scheduled December 5.   Continue Coumadin   chronic diastolic heart failure  Patient appears euvolemic. Continue spironolactone   essential hypertension  Blood pressure controlled.   long-term use anticoagulated therapy : Continue Coumadin

## 2015-03-08 ENCOUNTER — Ambulatory Visit
Admission: RE | Admit: 2015-03-08 | Discharge: 2015-03-08 | Disposition: A | Discharge status: Home / Self Care | Payer: Medicare Other | Source: Ambulatory Visit | Attending: Family Medicine | Admitting: Family Medicine

## 2015-03-08 ENCOUNTER — Other Ambulatory Visit: Payer: Self-pay | Admitting: Family Medicine

## 2015-03-08 DIAGNOSIS — R2231 Localized swelling, mass and lump, right upper limb: Secondary | ICD-10-CM

## 2015-03-08 DIAGNOSIS — N63 Unspecified lump in unspecified breast: Secondary | ICD-10-CM

## 2015-03-09 ENCOUNTER — Encounter: Payer: Self-pay | Admitting: Cardiovascular Disease

## 2015-03-10 ENCOUNTER — Other Ambulatory Visit: Payer: Self-pay | Admitting: Family Medicine

## 2015-03-10 DIAGNOSIS — N6310 Unspecified lump in the right breast, unspecified quadrant: Secondary | ICD-10-CM

## 2015-03-14 NOTE — Addendum Note (Signed)
Addended by: Diana Eves on: 03/14/2015 01:52 PM   Modules accepted: Orders

## 2015-03-16 ENCOUNTER — Ambulatory Visit
Admission: RE | Admit: 2015-03-16 | Discharge: 2015-03-16 | Disposition: A | Discharge status: Home / Self Care | Payer: Medicare Other | Source: Ambulatory Visit | Attending: Family Medicine | Admitting: Family Medicine

## 2015-03-16 ENCOUNTER — Other Ambulatory Visit: Payer: Self-pay | Admitting: Family Medicine

## 2015-03-16 ENCOUNTER — Other Ambulatory Visit (HOSPITAL_COMMUNITY): Payer: Self-pay | Admitting: Diagnostic Radiology

## 2015-03-16 DIAGNOSIS — N6310 Unspecified lump in the right breast, unspecified quadrant: Secondary | ICD-10-CM

## 2015-03-16 DIAGNOSIS — R2232 Localized swelling, mass and lump, left upper limb: Secondary | ICD-10-CM

## 2015-03-20 ENCOUNTER — Other Ambulatory Visit: Payer: Self-pay | Admitting: Pharmacist Clinician (PhC)/ Clinical Pharmacy Specialist

## 2015-03-20 MED ORDER — METOPROLOL TARTRATE 25 MG PO TABS: 25.0000 mg | ORAL_TABLET | Freq: Two times a day (BID) | ORAL | Status: DC

## 2015-03-20 MED ORDER — FOLIC ACID 1 MG PO TABS: 2.0000 mg | ORAL_TABLET | Freq: Every day | ORAL | Status: DC

## 2015-03-20 NOTE — Telephone Encounter (Signed)
Patient called, had breast biopsy and was found to have an abnormal lymph node.  Has appointment with oncology for follow up.  Advised to restart her warfarin at 2 mg daily and will keep November appointment

## 2015-03-21 ENCOUNTER — Telehealth: Payer: Self-pay | Admitting: Hematology and Oncology

## 2015-03-21 NOTE — Telephone Encounter (Signed)
Contacted pt regarding new patient referral. Pt confirmed appt for 03/22/15

## 2015-03-22 ENCOUNTER — Encounter: Payer: Self-pay | Admitting: Hematology and Oncology

## 2015-03-22 ENCOUNTER — Other Ambulatory Visit: Payer: Self-pay | Admitting: *Deleted

## 2015-03-22 ENCOUNTER — Other Ambulatory Visit (HOSPITAL_COMMUNITY)
Admission: RE | Admit: 2015-03-22 | Discharge: 2015-03-22 | Disposition: A | Discharge status: Home / Self Care | Payer: Medicare Other | Source: Ambulatory Visit | Attending: Hematology and Oncology | Admitting: Hematology and Oncology

## 2015-03-22 ENCOUNTER — Ambulatory Visit (HOSPITAL_BASED_OUTPATIENT_CLINIC_OR_DEPARTMENT_OTHER): Payer: Medicare Other

## 2015-03-22 ENCOUNTER — Telehealth: Payer: Self-pay | Admitting: Hematology and Oncology

## 2015-03-22 ENCOUNTER — Ambulatory Visit (HOSPITAL_BASED_OUTPATIENT_CLINIC_OR_DEPARTMENT_OTHER): Payer: Medicare Other | Admitting: Hematology and Oncology

## 2015-03-22 VITALS — BP 140/78 | HR 90 | Temp 97.7°F | Resp 18 | Ht 63.0 in | Wt 134.3 lb

## 2015-03-22 DIAGNOSIS — R590 Localized enlarged lymph nodes: Secondary | ICD-10-CM

## 2015-03-22 DIAGNOSIS — C9200 Acute myeloblastic leukemia, not having achieved remission: Secondary | ICD-10-CM

## 2015-03-22 DIAGNOSIS — E039 Hypothyroidism, unspecified: Secondary | ICD-10-CM

## 2015-03-22 DIAGNOSIS — R599 Enlarged lymph nodes, unspecified: Secondary | ICD-10-CM

## 2015-03-22 LAB — LACTATE DEHYDROGENASE (CC13): LDH: 271 U/L — AB (ref 125–245)

## 2015-03-22 LAB — COMPREHENSIVE METABOLIC PANEL (CC13)
ALT: 17 U/L (ref 0–55)
AST: 20 U/L (ref 5–34)
Albumin: 4.1 g/dL (ref 3.5–5.0)
Alkaline Phosphatase: 98 U/L (ref 40–150)
Anion Gap: 7 mEq/L (ref 3–11)
BUN: 15 mg/dL (ref 7.0–26.0)
CALCIUM: 9.5 mg/dL (ref 8.4–10.4)
CHLORIDE: 111 meq/L — AB (ref 98–109)
CO2: 26 meq/L (ref 22–29)
CREATININE: 1 mg/dL (ref 0.6–1.1)
EGFR: 48 mL/min/{1.73_m2} — ABNORMAL LOW (ref >90)
GLUCOSE: 95 mg/dL (ref 70–140)
POTASSIUM: 4.4 meq/L (ref 3.5–5.1)
SODIUM: 144 meq/L (ref 136–145)
Total Bilirubin: 0.79 mg/dL (ref 0.20–1.20)
Total Protein: 7.4 g/dL (ref 6.4–8.3)

## 2015-03-22 LAB — CBC WITH DIFFERENTIAL/PLATELET
HCT: 37.5 % (ref 34.8–46.6)
HEMOGLOBIN: 12.1 g/dL (ref 11.6–15.9)
MCH: 30.6 pg (ref 25.1–34.0)
MCHC: 32.3 g/dL (ref 31.5–36.0)
MCV: 94.9 fL (ref 79.5–101.0)
Platelets: 260 10*3/uL (ref 145–400)
RBC: 3.95 10*6/uL (ref 3.70–5.45)
RDW: 13.9 % (ref 11.2–14.5)
WBC: 8.4 10*3/uL (ref 3.9–10.3)

## 2015-03-22 LAB — MANUAL DIFFERENTIAL
ALC: 1.3 10*3/uL (ref 0.9–3.3)
ANC (CHCC manual diff): 4.3 10*3/uL (ref 1.5–6.5)
LYMPH: 16 % (ref 14–49)
MONO: 6 % (ref 0–14)
PLT EST: ADEQUATE
SEG: 51 % (ref 38–77)

## 2015-03-22 NOTE — Telephone Encounter (Signed)
Appointments made and avs printed °

## 2015-03-22 NOTE — Assessment & Plan Note (Signed)
Right axillary lymphadenopathy: Detected through with routine screening mammogram status post ultrasound-guided biopsy, 8 mm lymph nodes, atypical lymphoproliferative process suspicious for lymphoma, flow cytometry revealed excess CD4 positive cells suspicious for angioimmunoblastic T-cell lymphoma  Discussion: I discussed with the patient extensively about what a lymph node is and what other types of cells that are comprising in the lymph node. We also discussed the different causes of increasing lymph nodes especially reactive processes, infections, secondary malignancies like breast cancer, primary malignancies like lymphomas. Based upon pathology report, there is not enough tissue to accurately diagnose lymphoma.  Plan: 1. Obtain CT of the neck chest abdomen pelvis with contrast 2. We will see if any additional lymph node enlargements are noted. We will also assess to see if an excisional lymph node biopsy can be done. 3. If no additional lymph nodes are enlarged, we might elect to watch and monitor this with a repeat ultrasound in 3 months for his further assessment. These lymph nodes are extremely small to have an excisional biopsy done by surgery.  Patient does not have any B symptoms especially no fevers chills weight loss or night sweats. She did lose 10 pounds weight previously because of nausea and vomiting issues related to medications.  Return to clinic in 2 weeks to discuss the results of the CT scans and follow-up.

## 2015-03-22 NOTE — Progress Notes (Signed)
[facility] CONSULT NOTE  Patient Care Team: [name], MD as PCP - General (Family Medicine)  CHIEF COMPLAINTS/PURPOSE OF CONSULTATION:  Newly diagnosed axillary lymphadenopathy  HISTORY OF PRESENTING ILLNESS:  [name] 81 y.o. female is here because of recent diagnosis of right axillary lymphadenopathy. She had a routine screening mammogram which picked up lymphadenopathy in the right axilla. This was evaluated by ultrasound. These were extremely small lymph nodes measuring 8 mm in size. She underwent an ultrasound-guided biopsy of this lymph node which came back as atypical lymphoid proliferation suspicious for lymphoma. Flow cytometry revealed predominance of T lymphocytes with expression of pan T cell antigens but relative abundance of CD4 positive cells. Immunohistochemistry was performed that revealed overwhelming majority of lymphoid cells consistent with T cells with expression of CD3 CD43 and CD5 with BCL-2 expression., There is a minor B-cell population demonstrated a CD20 CD3 79 8., Overall features according to the pathologist were highly suspicious for a T-cell lymphoproliferative process particularly angioimmunoblastic T-cell lymphoma. There are requesting an excisional biopsy for further evaluation.  Patient reports that she had nine-month episode of rash on both her breasts which she attributed to being on sulfa medication. She applied ointments every day and ultimately taken out. She suspects that these lymph nodes could be related to the rash.  MEDICAL HISTORY:  Past Medical History  Diagnosis Date  . Chronic diastolic CHF (congestive heart failure) ([place])     a. 10/2012 Echo: EF 55-60%, no rwma, Gr 2 DD, moderate AS, mod dil LA, mildly to mod dil RA.  Marland Kitchen Hypothyroidism   . Dyslipidemia   . Hypertension   . PAF (paroxysmal atrial fibrillation) ([place])     a. CHA2DS2VASc = 7-->chronic coumadin;  b. s/p DCCV 05/2011, 05/2012, 07/2014;  c. On amio.  Marland Kitchen  Acute  respiratiory failure requiring BiPap 06/24/2011  . Aortic valvular stenosis, moderate to severe 06/24/2011    a. 10/2012 Echo: mod AS, Valve 1.25 cm^2 (VTI), 1.19cm^2 (Vmax).  . Allergy   . Coronary artery disease     a. 03/2009 PCI RCA (3.0x18 BMS).  . Exertional shortness of breath   . Arthritis     "back" (10/30/2012)  . Rash     a. felt to be 2/2 lasix/torsemide in setting of sulfa allergy (lasix d/c ~ 06/2014, torsemide d/c 08/09/2014).    SURGICAL HISTORY: Past Surgical History  Procedure Laterality Date  . Cardioversion  06/19/2011    Procedure: CARDIOVERSION;  Surgeon: Sanda Klein, MD;  Location: [facility];  Service: Cardiovascular;  Laterality: N/A;  . Appendectomy    . Angioplasty    . Cataract extraction w/ intraocular lens  implant, bilateral    . Lumbar disc surgery  X 2    "cleaned out scar tissue"   . Coronary angioplasty with stent placement      "1" (10/30/2012)  . Dilation and curettage of uterus    . Excisional hemorrhoidectomy    . Wrist fracture surgery Left   . Shoulder acromioplasty Left     "fell; put a new ball in" (10/30/2012)  . Cardioversion  07/11/2010    Successful coversion to sinus rhythm  . Cardiac catheterization  10/06/2009    Continue medical therapy  . Cardiac catheterization  04/07/2009    RCA stented with a 3x42m stent resulting in a reduction of 90% narrowing to normal  . Cardioversion N/A 08/10/2014    Procedure: CARDIOVERSION;  Surgeon: MSanda Klein MD;  [place] [facility]  Service:  Cardiovascular;  Laterality: N/A;  . Back surgery    . Fracture surgery      SOCIAL HISTORY: Social History   Social History  . Marital Status: Married    Spouse Name: N/A  . Number of Children: 1  . Years of Education: N/A   Occupational History  . Retired [employer]   Social History Main Topics  . Smoking status: Never Smoker   . Smokeless tobacco: Never Used  . Alcohol Use: No  . Drug Use: No  . Sexual Activity: No   Other Topics  Concern  . Not on file   Social History Narrative   Lives in [place], [place]. She got married in 1948.    FAMILY HISTORY: Family History  Problem Relation Age of Onset  . Heart disease Brother   . Heart disease Brother   . Lung disease Brother   . Heart disease Mother 48  . Cancer Mother 20    Uterine  . Stroke Mother 19  . Heart disease Father   . Hypertension Child 34  . Alcohol abuse Child   . Heart attack Father   . Heart failure Mother   . Cancer Son   . Hypertension Mother   . Hypertension Father   . Hypertension Sister   . Hypertension Brother     ALLERGIES:  is allergic to edecrin; tape; furosemide; sulfa antibiotics; thiazide-type diuretics; and torsemide.  MEDICATIONS:  Current Outpatient Prescriptions  Medication Sig Dispense Refill  . acetaminophen (TYLENOL) 650 MG CR tablet Take 650 mg by mouth 3 (three) times daily.     Marland Kitchen albuterol (PROVENTIL HFA;VENTOLIN HFA) 108 (90 BASE) MCG/ACT inhaler Inhale 2 puffs into the lungs every 6 (six) hours as needed for wheezing or shortness of breath.    Marland Kitchen atorvastatin (LIPITOR) 40 MG tablet TAKE 1 TABLET DAILY (Patient taking differently: TAKE 1 TABLET at supper time.) 90 tablet 3  . fluticasone (FLONASE) 50 MCG/ACT nasal spray Place 2 sprays into both nostrils daily. Use as directed    . folic acid (FOLVITE) 1 MG tablet Take 2 tablets (2 mg total) by mouth daily. 180 tablet 3  . ipratropium (ATROVENT) 0.03 % nasal spray Place 2 sprays into both nostrils daily. Use as directed    . levothyroxine (SYNTHROID, LEVOTHROID) 75 MCG tablet Take 1 tablet (75 mcg total) by mouth daily before breakfast. 30 tablet 2  . loratadine (CLARITIN) 10 MG tablet Take 10 mg by mouth daily.    . metoprolol tartrate (LOPRESSOR) 25 MG tablet Take 1 tablet (25 mg total) by mouth 2 (two) times daily. 180 tablet 3  . Multiple Vitamins-Minerals (CENTRUM SILVER) tablet Take 1 tablet by mouth daily.      . Polyvinyl Alcohol-Povidone (TEARS PLUS OP) Place 1  drop into both eyes at bedtime.     Marland Kitchen spironolactone (ALDACTONE) 25 MG tablet Take 1 tablet (25 mg total) by mouth daily. 90 tablet 1  . warfarin (COUMADIN) 1 MG tablet Take 2 tablets by mouth daily or as directed by coumadin clinic (Patient taking differently: Take 2 mg by mouth daily at 6 PM. ) 180 tablet 1   No current facility-administered medications for this visit.    REVIEW OF SYSTEMS:   Constitutional: Denies fevers, chills or abnormal night sweats Eyes: Denies blurriness of vision, double vision or watery eyes Ears, nose, mouth, throat, and face: Denies mucositis or sore throat Respiratory: Denies cough, dyspnea or wheezes Cardiovascular: Denies palpitation, chest discomfort complains of lower extremity swelling Gastrointestinal:  Denies nausea, heartburn or change in bowel habits Skin: Denies abnormal skin rashes Lymphatics: No enlarged lymph nodes Neurological:Denies numbness, tingling or new weaknesses Behavioral/Psych: Mood is stable, no new changes   All other systems were reviewed with the patient and are negative.  PHYSICAL EXAMINATION: ECOG PERFORMANCE STATUS: 1 - Symptomatic but completely ambulatory  Filed Vitals:   03/22/15 1520  BP: 140/78  Pulse: 90  Temp: 97.7 F (36.5 C)  Resp: 18   Filed Weights   03/22/15 1520  Weight: 134 lb 4.8 oz (60.918 kg)    GENERAL:alert, no distress and comfortable SKIN: skin color, texture, turgor are normal, no rashes or significant lesions EYES: normal, conjunctiva are pink and non-injected, sclera clear OROPHARYNX:no exudate, no erythema and lips, buccal mucosa, and tongue normal  NECK: supple, thyroid normal size, non-tender, without nodularity LYMPH:  no palpable lymphadenopathy in the cervical, axillary or inguinal LUNGS: clear to auscultation and percussion with normal breathing effort HEART: regular rate & rhythm and no murmurs and no lower extremity edema ABDOMEN:abdomen soft, non-tender and normal bowel  sounds Musculoskeletal:no cyanosis of digits and no clubbing  PSYCH: alert & oriented x 3 with fluent speech NEURO: no focal motor/sensory deficits  LABORATORY DATA:  I have reviewed the data as listed Lab Results  Component Value Date   WBC 11.3* 02/07/2015   HGB 12.2 02/07/2015   HCT 38.9 02/07/2015   MCV 95.6 02/07/2015   PLT 224 02/07/2015   Lab Results  Component Value Date   NA 141 02/09/2015   K 4.6 02/09/2015   CL 113* 02/09/2015   CO2 23 02/09/2015    ASSESSMENT AND PLAN:  Lymphadenopathy, axillary Right axillary lymphadenopathy: Detected through with routine screening mammogram status post ultrasound-guided biopsy, 8 mm lymph nodes, atypical lymphoproliferative process suspicious for lymphoma, flow cytometry revealed excess CD4 positive cells suspicious for angioimmunoblastic T-cell lymphoma  Discussion: I discussed with the patient extensively about what a lymph node is and what other types of cells that are comprising in the lymph node. We also discussed the different causes of increasing lymph nodes especially reactive processes, infections, secondary malignancies like breast cancer, primary malignancies like lymphomas. Based upon pathology report, there is not enough tissue to accurately diagnose lymphoma.  On top of this patient had a rash in both her breasts for about 9 months and she replied different ointments and creams. It is unclear what this rash is all about. It certainly could have led to some reactive lymph node enlargements in the axilla.  Plan: 1. Obtain CT of the neck chest abdomen pelvis with contrast 2. We will see if any additional lymph node enlargements are noted. We will also assess to see if an excisional lymph node biopsy can be done. 3. If no additional lymph nodes are enlarged, we might elect to watch and monitor this with a repeat ultrasound in 3 months for his further assessment. These lymph nodes are extremely small to have an excisional  biopsy done by surgery.  Patient does not have any B symptoms especially no fevers chills weight loss or night sweats. She did lose 10 pounds weight previously because of nausea and vomiting issues related to medications.  Return to clinic in 2 weeks to discuss the results of the CT scans and follow-up. We are also planning to obtain blood work today. Including CMV and EBV titers.   All questions were answered. The patient knows to call the clinic with any problems, questions or concerns.    Lindi Adie,  Tyler Deis, MD 4:19 PM

## 2015-03-23 ENCOUNTER — Encounter: Payer: Self-pay | Admitting: Internal Medicine

## 2015-03-23 LAB — EPSTEIN-BARR VIRUS VCA, IGM

## 2015-03-23 LAB — EPSTEIN-BARR VIRUS VCA, IGG: EBV VCA IgG: >750 U/mL — ABNORMAL HIGH (ref <18.0)

## 2015-03-24 LAB — CYTOMEGALOVIRUS ANTIBODY, IGG: Cytomegalovirus Ab-IgG: 5.8 U/mL — ABNORMAL HIGH (ref <0.60)

## 2015-03-24 LAB — FLOW CYTOMETRY

## 2015-03-29 ENCOUNTER — Ambulatory Visit (HOSPITAL_COMMUNITY): Payer: Medicare Other

## 2015-03-29 ENCOUNTER — Encounter (HOSPITAL_COMMUNITY): Payer: Self-pay

## 2015-03-29 ENCOUNTER — Ambulatory Visit (HOSPITAL_COMMUNITY)
Admission: RE | Admit: 2015-03-29 | Discharge: 2015-03-29 | Disposition: A | Discharge status: Home / Self Care | Payer: Medicare Other | Source: Ambulatory Visit | Attending: Hematology and Oncology | Admitting: Hematology and Oncology

## 2015-03-29 DIAGNOSIS — I700 Atherosclerosis of aorta: Secondary | ICD-10-CM | POA: Diagnosis not present

## 2015-03-29 DIAGNOSIS — E041 Nontoxic single thyroid nodule: Secondary | ICD-10-CM | POA: Insufficient documentation

## 2015-03-29 DIAGNOSIS — R590 Localized enlarged lymph nodes: Secondary | ICD-10-CM | POA: Insufficient documentation

## 2015-03-29 DIAGNOSIS — K8020 Calculus of gallbladder without cholecystitis without obstruction: Secondary | ICD-10-CM | POA: Insufficient documentation

## 2015-03-29 MED ORDER — IOHEXOL 300 MG/ML  SOLN
100.0000 mL | Freq: Once | INTRAMUSCULAR | Status: AC | PRN
  Administered 2015-03-29: 100 mL via INTRAVENOUS

## 2015-04-04 NOTE — Assessment & Plan Note (Signed)
Right axillary lymphadenopathy: Detected through with routine screening mammogram status post ultrasound-guided biopsy, 8 mm lymph nodes, atypical lymphoproliferative process suspicious for lymphoma, flow cytometry revealed excess CD4 positive cells suspicious for angioimmunoblastic T-cell lymphoma CT scans 03/29/15: Borderline bil axillary LN and inguinal LN  Plan:

## 2015-04-05 ENCOUNTER — Telehealth: Payer: Self-pay | Admitting: Hematology and Oncology

## 2015-04-05 ENCOUNTER — Ambulatory Visit (HOSPITAL_BASED_OUTPATIENT_CLINIC_OR_DEPARTMENT_OTHER): Payer: Medicare Other | Admitting: Hematology and Oncology

## 2015-04-05 ENCOUNTER — Encounter: Payer: Self-pay | Admitting: Hematology and Oncology

## 2015-04-05 VITALS — BP 151/79 | HR 63 | Temp 97.8°F | Resp 18 | Ht 63.0 in | Wt 134.4 lb

## 2015-04-05 DIAGNOSIS — R590 Localized enlarged lymph nodes: Secondary | ICD-10-CM

## 2015-04-05 DIAGNOSIS — C8444 Peripheral T-cell lymphoma, not classified, lymph nodes of axilla and upper limb: Secondary | ICD-10-CM

## 2015-04-05 DIAGNOSIS — C8440 Peripheral T-cell lymphoma, not classified, unspecified site: Secondary | ICD-10-CM

## 2015-04-05 NOTE — Progress Notes (Signed)
Patient Care Team: [name], MD as PCP - General (Family Medicine)  DIAGNOSIS: angioimmunoblastic T-cell lymphoma  CHIEF COMPLIANT: follow-up after recent CT scans INTERVAL HISTORY: [name] is a 81 year old with above-mentioned history of angioimmunoblastic T-cell lymphoma diagnosed when she underwent mammography that revealed small lymph nodes in the right axilla. She underwent a biopsy of the lymph nodes which came back as a new angioimmunoblastic T-cell lymphoma. She had CT of the chest abdomen pelvis and came back. Scans revealed small bilateral inguinal and bilateral axillary lymphadenopathy. No other evidence of systemic disease was noted. She is here today discuss treatment options.  REVIEW OF SYSTEMS:   Constitutional: Denies fevers, chills or abnormal weight loss Eyes: Denies blurriness of vision Ears, nose, mouth, throat, and face: Denies mucositis or sore throat Respiratory: Denies cough, dyspnea or wheezes Cardiovascular: Denies palpitation, chest discomfort or lower extremity swelling Gastrointestinal:  Denies nausea, heartburn or change in bowel habits Skin: Denies abnormal skin rashes Lymphatics: Denies new lymphadenopathy or easy bruising Neurological:Denies numbness, tingling or new weaknesses Behavioral/Psych: Mood is stable, no new changes  Breast:  denies any pain or lumps or nodules in either breasts All other systems were reviewed with the patient and are negative.  I have reviewed the past medical history, past surgical history, social history and family history with the patient and they are unchanged from previous note.  ALLERGIES:  is allergic to edecrin; tape; furosemide; sulfa antibiotics; thiazide-type diuretics; and torsemide.  MEDICATIONS:  Current Outpatient Prescriptions  Medication Sig Dispense Refill  . acetaminophen (TYLENOL) 650 MG CR tablet Take 650 mg by mouth 3 (three) times daily.     Marland Kitchen albuterol (PROVENTIL HFA;VENTOLIN HFA) 108  (90 BASE) MCG/ACT inhaler Inhale 2 puffs into the lungs every 6 (six) hours as needed for wheezing or shortness of breath.    Marland Kitchen atorvastatin (LIPITOR) 40 MG tablet TAKE 1 TABLET DAILY (Patient taking differently: TAKE 1 TABLET at supper time.) 90 tablet 3  . fluticasone (FLONASE) 50 MCG/ACT nasal spray Place 2 sprays into both nostrils daily. Use as directed    . folic acid (FOLVITE) 1 MG tablet Take 2 tablets (2 mg total) by mouth daily. 180 tablet 3  . ipratropium (ATROVENT) 0.03 % nasal spray Place 2 sprays into both nostrils daily. Use as directed    . levothyroxine (SYNTHROID, LEVOTHROID) 75 MCG tablet Take 1 tablet (75 mcg total) by mouth daily before breakfast. 30 tablet 2  . loratadine (CLARITIN) 10 MG tablet Take 10 mg by mouth daily.    . metoprolol tartrate (LOPRESSOR) 25 MG tablet Take 1 tablet (25 mg total) by mouth 2 (two) times daily. 180 tablet 3  . Multiple Vitamins-Minerals (CENTRUM SILVER) tablet Take 1 tablet by mouth daily.      . Polyvinyl Alcohol-Povidone (TEARS PLUS OP) Place 1 drop into both eyes at bedtime.     Marland Kitchen spironolactone (ALDACTONE) 25 MG tablet Take 1 tablet (25 mg total) by mouth daily. 90 tablet 1  . warfarin (COUMADIN) 1 MG tablet Take 2 tablets by mouth daily or as directed by coumadin clinic (Patient taking differently: Take 2 mg by mouth daily at 6 PM. ) 180 tablet 1   No current facility-administered medications for this visit.    PHYSICAL EXAMINATION: ECOG PERFORMANCE STATUS: 0 - Asymptomatic  Filed Vitals:   04/05/15 1409  BP: 151/79  Pulse: 63  Temp: 97.8 F (36.6 C)  Resp: 18   Filed Weights   04/05/15 1409  Weight:  134 lb 6.4 oz (60.963 kg)    GENERAL:alert, no distress and comfortable SKIN: skin color, texture, turgor are normal, no rashes or significant lesions EYES: normal, Conjunctiva are pink and non-injected, sclera clear OROPHARYNX:no exudate, no erythema and lips, buccal mucosa, and tongue normal  NECK: supple, thyroid normal  size, non-tender, without nodularity LYMPH:  no palpable lymphadenopathy in the cervical, axillary or inguinal LUNGS: clear to auscultation and percussion with normal breathing effort HEART: regular rate & rhythm and no murmurs and no lower extremity edema ABDOMEN:abdomen soft, non-tender and normal bowel sounds Musculoskeletal:no cyanosis of digits and no clubbing  NEURO: alert & oriented x 3 with fluent speech, no focal motor/sensory deficits   LABORATORY DATA:  I have reviewed the data as listed   Chemistry      Component Value Date/Time   NA 144 03/22/2015 1621   NA 141 02/09/2015 1405   K 4.4 03/22/2015 1621   K 4.6 02/09/2015 1405   CL 113* 02/09/2015 1405   CO2 26 03/22/2015 1621   CO2 23 02/09/2015 1405   BUN 15.0 03/22/2015 1621   BUN 24* 02/09/2015 1405   CREATININE 1.0 03/22/2015 1621   CREATININE 1.16* 02/09/2015 1405   CREATININE 1.07* 01/31/2015 0853      Component Value Date/Time   CALCIUM 9.5 03/22/2015 1621   CALCIUM 9.3 02/09/2015 1405   ALKPHOS 98 03/22/2015 1621   ALKPHOS 71 02/08/2015 0340   AST 20 03/22/2015 1621   AST 21 02/08/2015 0340   ALT 17 03/22/2015 1621   ALT 11* 02/08/2015 0340   BILITOT 0.79 03/22/2015 1621   BILITOT 0.9 02/08/2015 0340       Lab Results  Component Value Date   WBC 8.4 03/22/2015   HGB 12.1 03/22/2015   HCT 37.5 03/22/2015   MCV 94.9 03/22/2015   PLT 260 03/22/2015   NEUTROABS 6.8 02/07/2015   ASSESSMENT & PLAN:  Lymphadenopathy, axillary Right axillary lymphadenopathy: Detected through with routine screening mammogram status post ultrasound-guided biopsy, 8 mm lymph nodes, atypical lymphoproliferative process suspicious for lymphoma, flow cytometry revealed excess CD4 positive cells suspicious for angioimmunoblastic T-cell lymphoma CT scans 03/29/15: Borderline bil axillary LN and inguinal LN  Plan:  1. I discussed with her that given her advanced age, I do not recommend aggressive chemotherapy. 2. Options  were surveillance versus prednisone therapy. After much discussion patient chose to do surveillance without any additional medications.  Return to clinic in 3 months with scans and lab work and follow-up.  No orders of the defined types were placed in this encounter.   The patient has a good understanding of the overall plan. she agrees with it. she will call with any problems that may develop before the next visit here.   Rulon Eisenmenger, MD 04/05/2015

## 2015-04-05 NOTE — Addendum Note (Signed)
Addended by: Prentiss Bells on: 04/05/2015 04:59 PM   Modules accepted: Medications

## 2015-04-05 NOTE — Telephone Encounter (Signed)
Appointments made and avs printed for patient,contrast given

## 2015-04-09 ENCOUNTER — Emergency Department (HOSPITAL_BASED_OUTPATIENT_CLINIC_OR_DEPARTMENT_OTHER)
Admission: EM | Admit: 2015-04-09 | Discharge: 2015-04-09 | Disposition: A | Discharge status: Home / Self Care | Payer: Medicare Other | Attending: Emergency Medicine | Admitting: Emergency Medicine

## 2015-04-09 ENCOUNTER — Encounter (HOSPITAL_BASED_OUTPATIENT_CLINIC_OR_DEPARTMENT_OTHER): Payer: Self-pay | Admitting: Emergency Medicine

## 2015-04-09 DIAGNOSIS — M1990 Unspecified osteoarthritis, unspecified site: Secondary | ICD-10-CM | POA: Insufficient documentation

## 2015-04-09 DIAGNOSIS — X58XXXA Exposure to other specified factors, initial encounter: Secondary | ICD-10-CM | POA: Diagnosis not present

## 2015-04-09 DIAGNOSIS — I2510 Atherosclerotic heart disease of native coronary artery without angina pectoris: Secondary | ICD-10-CM | POA: Diagnosis not present

## 2015-04-09 DIAGNOSIS — Z79899 Other long term (current) drug therapy: Secondary | ICD-10-CM | POA: Diagnosis not present

## 2015-04-09 DIAGNOSIS — Z791 Long term (current) use of non-steroidal anti-inflammatories (NSAID): Secondary | ICD-10-CM | POA: Insufficient documentation

## 2015-04-09 DIAGNOSIS — Y998 Other external cause status: Secondary | ICD-10-CM | POA: Insufficient documentation

## 2015-04-09 DIAGNOSIS — I499 Cardiac arrhythmia, unspecified: Secondary | ICD-10-CM | POA: Diagnosis not present

## 2015-04-09 DIAGNOSIS — Y9389 Activity, other specified: Secondary | ICD-10-CM | POA: Diagnosis not present

## 2015-04-09 DIAGNOSIS — S61412A Laceration without foreign body of left hand, initial encounter: Secondary | ICD-10-CM | POA: Diagnosis not present

## 2015-04-09 DIAGNOSIS — Z7951 Long term (current) use of inhaled steroids: Secondary | ICD-10-CM | POA: Insufficient documentation

## 2015-04-09 DIAGNOSIS — Z9861 Coronary angioplasty status: Secondary | ICD-10-CM | POA: Diagnosis not present

## 2015-04-09 DIAGNOSIS — Y9289 Other specified places as the place of occurrence of the external cause: Secondary | ICD-10-CM | POA: Diagnosis not present

## 2015-04-09 DIAGNOSIS — I10 Essential (primary) hypertension: Secondary | ICD-10-CM | POA: Insufficient documentation

## 2015-04-09 DIAGNOSIS — Z8709 Personal history of other diseases of the respiratory system: Secondary | ICD-10-CM | POA: Diagnosis not present

## 2015-04-09 DIAGNOSIS — I5032 Chronic diastolic (congestive) heart failure: Secondary | ICD-10-CM | POA: Insufficient documentation

## 2015-04-09 DIAGNOSIS — E039 Hypothyroidism, unspecified: Secondary | ICD-10-CM | POA: Insufficient documentation

## 2015-04-09 DIAGNOSIS — S6992XA Unspecified injury of left wrist, hand and finger(s), initial encounter: Secondary | ICD-10-CM | POA: Diagnosis present

## 2015-04-09 DIAGNOSIS — E785 Hyperlipidemia, unspecified: Secondary | ICD-10-CM | POA: Insufficient documentation

## 2015-04-09 MED ORDER — LIDOCAINE HCL (PF) 1 % IJ SOLN
10.0000 mL | Freq: Once | INTRAMUSCULAR | Status: AC
  Administered 2015-04-09: 10 mL via INTRADERMAL
  Filled 2015-04-09: qty 10

## 2015-04-09 NOTE — ED Notes (Signed)
Suture cart at bedside 

## 2015-04-09 NOTE — ED Notes (Signed)
Patient was getting her coat off the rack and she scratched the top of her left hand and pulled the skin.  Bleeding is controlled.  Patient has a 4X4 gauge over it.

## 2015-04-09 NOTE — ED Provider Notes (Signed)
CSN: [id number]     Arrival date & time 04/09/15  3151 History   First MD Initiated Contact with Patient 04/09/15 [phone]     Chief Complaint  Patient presents with  . Abrasion    left hand     (Consider location/radiation/quality/duration/timing/severity/associated sxs/prior Treatment) HPI Comments: 81 y.o. Female with history of CHF, dyslipidemia, HTN, atrial fibrillation on Coumadin presents for hand injury.  The patient was reaching for her coat in the coat closet when her hand scratched the top of a shelf.  The shelf took the skin off of the back of her hand.  She is on Coumadin and goes to have her coumadin level checked tomorrow.  She reports a tetanus shot about 3-4 years ago.  Denies other injury.   Past Medical History  Diagnosis Date  . Chronic diastolic CHF (congestive heart failure) ([place])     a. 10/2012 Echo: EF 55-60%, no rwma, Gr 2 DD, moderate AS, mod dil LA, mildly to mod dil RA.  Marland Kitchen Hypothyroidism   . Dyslipidemia   . Hypertension   . PAF (paroxysmal atrial fibrillation) ([place])     a. CHA2DS2VASc = 7-->chronic coumadin;  b. s/p DCCV 05/2011, 05/2012, 07/2014;  c. On amio.  Marland Kitchen  Acute respiratiory failure requiring BiPap 06/24/2011  . Aortic valvular stenosis, moderate to severe 06/24/2011    a. 10/2012 Echo: mod AS, Valve 1.25 cm^2 (VTI), 1.19cm^2 (Vmax).  . Allergy   . Coronary artery disease     a. 03/2009 PCI RCA (3.0x18 BMS).  . Exertional shortness of breath   . Arthritis     "back" (10/30/2012)  . Rash     a. felt to be 2/2 lasix/torsemide in setting of sulfa allergy (lasix d/c ~ 06/2014, torsemide d/c 08/09/2014).   Past Surgical History  Procedure Laterality Date  . Cardioversion  06/19/2011    Procedure: CARDIOVERSION;  Surgeon: Sanda Klein, MD;  Location: [place];  Service: Cardiovascular;  Laterality: N/A;  . Appendectomy    . Angioplasty    . Cataract extraction w/ intraocular lens  implant, bilateral    . Lumbar disc surgery  X 2    "cleaned out scar tissue"     . Coronary angioplasty with stent placement      "1" (10/30/2012)  . Dilation and curettage of uterus    . Excisional hemorrhoidectomy    . Wrist fracture surgery Left   . Shoulder acromioplasty Left     "fell; put a new ball in" (10/30/2012)  . Cardioversion  07/11/2010    Successful coversion to sinus rhythm  . Cardiac catheterization  10/06/2009    Continue medical therapy  . Cardiac catheterization  04/07/2009    RCA stented with a 3x27m stent resulting in a reduction of 90% narrowing to normal  . Cardioversion N/A 08/10/2014    Procedure: CARDIOVERSION;  Surgeon: MSanda Klein MD;  Location: [facility];  Service: Cardiovascular;  Laterality: N/A;  . Back surgery    . Fracture surgery     Family History  Problem Relation Age of Onset  . Heart disease Brother   . Heart disease Brother   . Lung disease Brother   . Heart disease Mother 643 . Cancer Mother 647   Uterine  . Stroke Mother 639 . Heart disease Father   . Hypertension Child 34  . Alcohol abuse Child   . Heart attack Father   . Heart failure Mother   . Cancer Son   . Hypertension  Mother   . Hypertension Father   . Hypertension Sister   . Hypertension Brother    Social History  Substance Use Topics  . Smoking status: Never Smoker   . Smokeless tobacco: Never Used  . Alcohol Use: No   OB History    No data available     Review of Systems  Constitutional: Negative for appetite change and fatigue.  Respiratory: Negative for shortness of breath.   Gastrointestinal: Negative for nausea and vomiting.  Genitourinary: Negative for flank pain.  Skin: Positive for wound.  Neurological: Negative for syncope.  Hematological: Bruises/bleeds easily.      Allergies  [name]; Tape; Furosemide; Sulfa antibiotics; [name]; and Torsemide  Home Medications   Prior to Admission medications   Medication Sig Start Date End Date Taking? Authorizing Provider  acetaminophen (TYLENOL) 650 MG CR tablet  Take 650 mg by mouth 3 (three) times daily.     Historical Provider, MD  albuterol (PROVENTIL HFA;VENTOLIN HFA) 108 (90 BASE) MCG/ACT inhaler Inhale 2 puffs into the lungs every 6 (six) hours as needed for wheezing or shortness of breath.    Historical Provider, MD  atorvastatin (LIPITOR) 40 MG tablet TAKE 1 TABLET DAILY Patient taking differently: TAKE 1 TABLET at supper time. 11/04/14   Mihai Croitoru, MD  fluticasone (FLONASE) 50 MCG/ACT nasal spray Place 2 sprays into both nostrils daily. Use as directed 11/24/14   Historical Provider, MD  folic acid (FOLVITE) 1 MG tablet Take 2 tablets (2 mg total) by mouth daily. 03/20/15   Mihai Croitoru, MD  ipratropium (ATROVENT) 0.03 % nasal spray Place 2 sprays into both nostrils daily. Use as directed 11/24/14   Historical Provider, MD  levothyroxine (SYNTHROID, LEVOTHROID) 75 MCG tablet Take 1 tablet (75 mcg total) by mouth daily before breakfast. 02/09/15   Almyra Deforest, PA  loratadine (CLARITIN) 10 MG tablet Take 10 mg by mouth daily.    Historical Provider, MD  metoprolol tartrate (LOPRESSOR) 25 MG tablet Take 1 tablet (25 mg total) by mouth 2 (two) times daily. 03/20/15   Mihai Croitoru, MD  Multiple Vitamins-Minerals (CENTRUM SILVER) tablet Take 1 tablet by mouth daily.      Historical Provider, MD  Polyvinyl Alcohol-Povidone (TEARS PLUS OP) Place 1 drop into both eyes at bedtime.     Historical Provider, MD  spironolactone (ALDACTONE) 25 MG tablet Take 1 tablet (25 mg total) by mouth daily. 01/06/15   Mihai Croitoru, MD  warfarin (COUMADIN) 1 MG tablet Take 2 tablets by mouth daily or as directed by coumadin clinic Patient taking differently: Take 2 mg by mouth daily at 6 PM.  02/06/15   Mihai Croitoru, MD   BP 162/98 mmHg  Pulse 80  Temp(Src) 97.6 F (36.4 C) (Oral)  Resp 16  Ht '5\' 3"'$  (1.6 m)  Wt 132 lb (59.875 kg)  BMI 23.39 kg/m2  SpO2 99% Physical Exam  Constitutional: She is oriented to person, place, and time. She appears well-developed and  well-nourished. No distress.  HENT:  Head: Normocephalic and atraumatic.  Right Ear: External ear normal.  Left Ear: External ear normal.  Nose: Nose normal.  Mouth/Throat: Oropharynx is clear and moist. No oropharyngeal exudate.  Eyes: EOM are normal. Pupils are equal, round, and reactive to light.  Neck: Normal range of motion. Neck supple.  Cardiovascular: Normal rate, normal heart sounds and intact distal pulses.  An irregularly irregular rhythm present.  No murmur heard. Pulmonary/Chest: Effort normal. No respiratory distress. She has no wheezes. She has  no rales.  Abdominal: Soft. She exhibits no distension. There is no tenderness.  Musculoskeletal: Normal range of motion. She exhibits no edema or tenderness.       Left hand: She exhibits laceration. She exhibits normal range of motion, normal two-point discrimination and normal capillary refill. Normal sensation noted. Normal strength noted.       Hands: Neurological: She is alert and oriented to person, place, and time.  Skin: Skin is warm and dry. No rash noted. She is not diaphoretic.  Vitals reviewed.   ED Course  .Marland KitchenLaceration Repair Date/Time: 04/09/2015 10:03 AM Performed by: Lonia Skinner ROE Authorized by: Harvel Quale Consent: Verbal consent obtained. Risks and benefits: risks, benefits and alternatives were discussed Consent given by: patient Patient understanding: patient states understanding of the procedure being performed Required items: required blood products, implants, devices, and special equipment available Patient identity confirmed: verbally with patient Body area: upper extremity Location details: left hand Laceration length: 3 cm Foreign bodies: no foreign bodies Tendon involvement: none Nerve involvement: none Vascular damage: no Anesthesia: local infiltration Local anesthetic: lidocaine 1% without epinephrine Patient sedated: no Irrigation solution: saline Amount of cleaning:  standard Debridement: none Degree of undermining: none Skin closure: glue and Steri-Strips Comments: Attempt to suture one deeper area but unable to secondary to thinness of skin.  Closed with steristrips and glue with good approximation.   (including critical care time) Labs Review Labs Reviewed - No data to display  Imaging Review No results found. I have personally reviewed and evaluated these images and lab results as part of my medical decision-making.   EKG Interpretation None      MDM  Patient seen and evaluated in stable condition.  Isolated injury. Appears to be enough tissue for re approximation which was completed with steri strips and skin glue.  Patient instructed on wound care and told to follow up with her PCP for recheck.  Up to date with tetanus.  Discharged in stable condition with all questions answered. Final diagnoses:  None    1. Skin tear    Harvel Quale, MD 04/09/15 1005

## 2015-04-09 NOTE — Discharge Instructions (Signed)
Your skin appeared to have enough tissue on it to try to close.  The tape and glue will come off in a few days.  Keep your hand clean but do not soak it.  Follow up with your primary care physician at the end of the week.  Try not to use the hand for strenuous activity/lifting.  Skin Tear Care A skin tear is a wound in which the top layer of skin has peeled off. This is a common problem with aging because the skin becomes thinner and more fragile as a person gets older. In addition, some medicines, such as oral corticosteroids, can lead to skin thinning if taken for long periods of time.  A skin tear is often repaired with tape or skin adhesive strips. This keeps the skin that has been peeled off in contact with the healthier skin beneath. Depending on the location of the wound, a bandage (dressing) may be applied over the tape or skin adhesive strips. Sometimes, during the healing process, the skin turns black and dies. Even when this happens, the torn skin acts as a good dressing until the skin underneath gets healthier and repairs itself. HOME CARE INSTRUCTIONS   Change dressings once per day or as directed by your caregiver.  Gently clean the skin tear and the area around the tear using saline solution or mild soap and water.  Do not rub the injured skin dry. Let the area air dry.  Apply petroleum jelly or an antibiotic cream or ointment to keep the tear moist. This will help the wound heal. Do not allow a scab to form.  If the dressing sticks before the next dressing change, moisten it with warm soapy water and gently remove it.  Protect the injured skin until it has healed.  Only take over-the-counter or prescription medicines as directed by your caregiver.  Take showers or baths using warm soapy water. Apply a new dressing after the shower or bath.  Keep all follow-up appointments as directed by your caregiver.  SEEK IMMEDIATE MEDICAL CARE IF:   You have redness, swelling, or  increasing pain in the skin tear.  You havepus coming from the skin tear.  You have chills.  You have a red streak that goes away from the skin tear.  You have a bad smell coming from the tear or dressing.  You have a fever or persistent symptoms for more than 2-3 days.  You have a fever and your symptoms suddenly get worse. MAKE SURE YOU:  Understand these instructions.  Will watch this condition.  Will get help right away if your child is not doing well or gets worse.   This information is not intended to replace advice given to you by your health care provider. Make sure you discuss any questions you have with your health care provider.   Document Released: 02/05/2001 Document Revised: 02/05/2012 Document Reviewed: 11/25/2011 Elsevier Interactive Patient Education [employer].

## 2015-04-09 NOTE — ED Notes (Signed)
Patient stable and ambulatory. Patient verbalizes understanding of discharge instructions and follow-up.

## 2015-04-10 ENCOUNTER — Ambulatory Visit (INDEPENDENT_AMBULATORY_CARE_PROVIDER_SITE_OTHER): Payer: Medicare Other | Admitting: Pharmacist Clinician (PhC)/ Clinical Pharmacy Specialist

## 2015-04-10 DIAGNOSIS — I480 Paroxysmal atrial fibrillation: Secondary | ICD-10-CM

## 2015-04-10 DIAGNOSIS — Z7901 Long term (current) use of anticoagulants: Secondary | ICD-10-CM

## 2015-04-10 LAB — POCT INR: INR: 1.5

## 2015-04-13 ENCOUNTER — Encounter: Payer: Self-pay | Admitting: Family Medicine

## 2015-04-13 ENCOUNTER — Ambulatory Visit (INDEPENDENT_AMBULATORY_CARE_PROVIDER_SITE_OTHER): Payer: Medicare Other | Admitting: Family Medicine

## 2015-04-13 VITALS — BP 120/70 | HR 62 | Ht 63.0 in | Wt 134.0 lb

## 2015-04-13 DIAGNOSIS — S61412A Laceration without foreign body of left hand, initial encounter: Secondary | ICD-10-CM | POA: Diagnosis not present

## 2015-04-13 DIAGNOSIS — D692 Other nonthrombocytopenic purpura: Secondary | ICD-10-CM | POA: Insufficient documentation

## 2015-04-13 NOTE — Progress Notes (Signed)
   Subjective:    Patient ID: [name], female    DOB: 1923-09-02, 81 y.o.   MRN: [id number]  HPI she sustained a superficial laceration to the dorsal surface of the left hand and was seen in the emergency room on November 13. They put Steri-Strips on as she does show evidence of senile purpura.  Review of Systems     Objective:   Physical Exam Exam of the left hand shows the skin to be healing nicely with no warmth or tenderness.       Assessment & Plan:  Hand laceration, left, initial encounter  she is to keep the area covered to help keep that getting reinjured. I will write her prescription to get TDaP at the drugstore.

## 2015-04-15 ENCOUNTER — Emergency Department (HOSPITAL_COMMUNITY): Payer: Medicare Other

## 2015-04-15 ENCOUNTER — Encounter (HOSPITAL_COMMUNITY): Payer: Self-pay

## 2015-04-15 ENCOUNTER — Inpatient Hospital Stay (HOSPITAL_COMMUNITY)
Admission: EM | Admit: 2015-04-15 | Discharge: 2015-04-19 | DRG: 470 | Disposition: A | Discharge status: Skilled Nursing Facility | Payer: Medicare Other | Attending: Internal Medicine | Admitting: Internal Medicine

## 2015-04-15 ENCOUNTER — Inpatient Hospital Stay (HOSPITAL_COMMUNITY): Payer: Medicare Other

## 2015-04-15 DIAGNOSIS — E039 Hypothyroidism, unspecified: Secondary | ICD-10-CM | POA: Diagnosis present

## 2015-04-15 DIAGNOSIS — S72001A Fracture of unspecified part of neck of right femur, initial encounter for closed fracture: Secondary | ICD-10-CM | POA: Diagnosis present

## 2015-04-15 DIAGNOSIS — Z8781 Personal history of (healed) traumatic fracture: Secondary | ICD-10-CM | POA: Diagnosis present

## 2015-04-15 DIAGNOSIS — I350 Nonrheumatic aortic (valve) stenosis: Secondary | ICD-10-CM | POA: Diagnosis not present

## 2015-04-15 DIAGNOSIS — E785 Hyperlipidemia, unspecified: Secondary | ICD-10-CM | POA: Diagnosis present

## 2015-04-15 DIAGNOSIS — I110 Hypertensive heart disease with heart failure: Secondary | ICD-10-CM | POA: Diagnosis present

## 2015-04-15 DIAGNOSIS — Z7901 Long term (current) use of anticoagulants: Secondary | ICD-10-CM | POA: Diagnosis not present

## 2015-04-15 DIAGNOSIS — I480 Paroxysmal atrial fibrillation: Secondary | ICD-10-CM | POA: Diagnosis present

## 2015-04-15 DIAGNOSIS — Z01818 Encounter for other preprocedural examination: Secondary | ICD-10-CM | POA: Diagnosis not present

## 2015-04-15 DIAGNOSIS — I447 Left bundle-branch block, unspecified: Secondary | ICD-10-CM | POA: Diagnosis present

## 2015-04-15 DIAGNOSIS — C8440 Peripheral T-cell lymphoma, not classified, unspecified site: Secondary | ICD-10-CM | POA: Diagnosis not present

## 2015-04-15 DIAGNOSIS — D62 Acute posthemorrhagic anemia: Secondary | ICD-10-CM | POA: Diagnosis not present

## 2015-04-15 DIAGNOSIS — I10 Essential (primary) hypertension: Secondary | ICD-10-CM | POA: Diagnosis not present

## 2015-04-15 DIAGNOSIS — E038 Other specified hypothyroidism: Secondary | ICD-10-CM | POA: Diagnosis not present

## 2015-04-15 DIAGNOSIS — E071 Dyshormogenetic goiter: Secondary | ICD-10-CM | POA: Diagnosis not present

## 2015-04-15 DIAGNOSIS — I5032 Chronic diastolic (congestive) heart failure: Secondary | ICD-10-CM | POA: Diagnosis present

## 2015-04-15 DIAGNOSIS — M25551 Pain in right hip: Secondary | ICD-10-CM | POA: Diagnosis present

## 2015-04-15 DIAGNOSIS — Z961 Presence of intraocular lens: Secondary | ICD-10-CM | POA: Diagnosis present

## 2015-04-15 DIAGNOSIS — D72829 Elevated white blood cell count, unspecified: Secondary | ICD-10-CM | POA: Diagnosis present

## 2015-04-15 DIAGNOSIS — W010XXA Fall on same level from slipping, tripping and stumbling without subsequent striking against object, initial encounter: Secondary | ICD-10-CM | POA: Diagnosis present

## 2015-04-15 DIAGNOSIS — Z955 Presence of coronary angioplasty implant and graft: Secondary | ICD-10-CM

## 2015-04-15 DIAGNOSIS — S72001D Fracture of unspecified part of neck of right femur, subsequent encounter for closed fracture with routine healing: Secondary | ICD-10-CM | POA: Diagnosis not present

## 2015-04-15 DIAGNOSIS — I2510 Atherosclerotic heart disease of native coronary artery without angina pectoris: Secondary | ICD-10-CM | POA: Diagnosis present

## 2015-04-15 DIAGNOSIS — M1990 Unspecified osteoarthritis, unspecified site: Secondary | ICD-10-CM | POA: Diagnosis present

## 2015-04-15 DIAGNOSIS — I25118 Atherosclerotic heart disease of native coronary artery with other forms of angina pectoris: Secondary | ICD-10-CM | POA: Diagnosis not present

## 2015-04-15 DIAGNOSIS — Z01810 Encounter for preprocedural cardiovascular examination: Secondary | ICD-10-CM

## 2015-04-15 DIAGNOSIS — W19XXXA Unspecified fall, initial encounter: Secondary | ICD-10-CM

## 2015-04-15 DIAGNOSIS — E049 Nontoxic goiter, unspecified: Secondary | ICD-10-CM | POA: Diagnosis present

## 2015-04-15 DIAGNOSIS — I5033 Acute on chronic diastolic (congestive) heart failure: Secondary | ICD-10-CM | POA: Diagnosis present

## 2015-04-15 LAB — CBC WITH DIFFERENTIAL/PLATELET
BASOS ABS: 0 10*3/uL (ref 0.0–0.1)
BASOS PCT: 0 %
BASOS PCT: 1 %
Basophils Absolute: 0.2 10*3/uL — ABNORMAL HIGH (ref 0.0–0.1)
EOS ABS: 0 10*3/uL (ref 0.0–0.7)
EOS PCT: 0 %
Eosinophils Absolute: 0 10*3/uL (ref 0.0–0.7)
Eosinophils Relative: 0 %
HCT: 37.7 % (ref 36.0–46.0)
HEMATOCRIT: 39.2 % (ref 36.0–46.0)
HEMOGLOBIN: 12.5 g/dL (ref 12.0–15.0)
Hemoglobin: 11.8 g/dL — ABNORMAL LOW (ref 12.0–15.0)
LYMPHS ABS: 1.3 10*3/uL (ref 0.7–4.0)
Lymphocytes Relative: 22 %
Lymphocytes Relative: 8 %
Lymphs Abs: 2.8 10*3/uL (ref 0.7–4.0)
MCH: 30 pg (ref 26.0–34.0)
MCH: 30.2 pg (ref 26.0–34.0)
MCHC: 31.3 g/dL (ref 30.0–36.0)
MCHC: 31.9 g/dL (ref 30.0–36.0)
MCV: 95.9 fL (ref 78.0–100.0)
MCV: 94.7 fL (ref 78.0–100.0)
MONO ABS: 0.8 10*3/uL (ref 0.1–1.0)
MONO ABS: 0.7 10*3/uL (ref 0.1–1.0)
MONOS PCT: 4 %
Monocytes Relative: 6 %
NEUTROS PCT: 72 %
NEUTROS PCT: 87 %
Neutro Abs: 9.1 10*3/uL — ABNORMAL HIGH (ref 1.7–7.7)
Neutro Abs: 14.6 10*3/uL — ABNORMAL HIGH (ref 1.7–7.7)
PLATELETS: 212 10*3/uL (ref 150–400)
Platelets: 232 10*3/uL (ref 150–400)
RBC: 3.93 MIL/uL (ref 3.87–5.11)
RBC: 4.14 MIL/uL (ref 3.87–5.11)
RDW: 13.3 % (ref 11.5–15.5)
RDW: 13.5 % (ref 11.5–15.5)
WBC: 12.7 10*3/uL — AB (ref 4.0–10.5)
WBC: 16.8 10*3/uL — AB (ref 4.0–10.5)

## 2015-04-15 LAB — BASIC METABOLIC PANEL
Anion gap: 7 (ref 5–15)
BUN: 17 mg/dL (ref 6–20)
CALCIUM: 9 mg/dL (ref 8.9–10.3)
CHLORIDE: 108 mmol/L (ref 101–111)
CO2: 24 mmol/L (ref 22–32)
CREATININE: 0.87 mg/dL (ref 0.44–1.00)
GFR calc non Af Amer: 57 mL/min — ABNORMAL LOW (ref >60)
Glucose, Bld: 123 mg/dL — ABNORMAL HIGH (ref 65–99)
Potassium: 4.9 mmol/L (ref 3.5–5.1)
SODIUM: 139 mmol/L (ref 135–145)

## 2015-04-15 LAB — TYPE AND SCREEN
ABO/RH(D): O POS
Antibody Screen: NEGATIVE

## 2015-04-15 LAB — PROTIME-INR
INR: 1.83 — ABNORMAL HIGH (ref 0.00–1.49)
PROTHROMBIN TIME: 21.1 s — AB (ref 11.6–15.2)

## 2015-04-15 LAB — MAGNESIUM: MAGNESIUM: 2.2 mg/dL (ref 1.7–2.4)

## 2015-04-15 LAB — PHOSPHORUS: Phosphorus: 3.7 mg/dL (ref 2.5–4.6)

## 2015-04-15 MED ORDER — ACETAMINOPHEN 325 MG PO TABS
650.0000 mg | ORAL_TABLET | Freq: Three times a day (TID) | ORAL | Status: DC
  Administered 2015-04-15 – 2015-04-18 (×8): 650 mg via ORAL
  Filled 2015-04-15 (×8): qty 2

## 2015-04-15 MED ORDER — SODIUM CHLORIDE 0.9 % IV SOLN
1000.0000 mL | INTRAVENOUS | Status: DC
  Administered 2015-04-15: 1000 mL via INTRAVENOUS
  Administered 2015-04-17: 250 mL via INTRAVENOUS

## 2015-04-15 MED ORDER — IPRATROPIUM BROMIDE 0.03 % NA SOLN
2.0000 | Freq: Two times a day (BID) | NASAL | Status: DC
  Administered 2015-04-15: 2 via NASAL
  Filled 2015-04-15 (×2): qty 30

## 2015-04-15 MED ORDER — SODIUM CHLORIDE 0.9 % IV SOLN
1000.0000 mL | INTRAVENOUS | Status: DC
  Administered 2015-04-15: 1000 mL via INTRAVENOUS

## 2015-04-15 MED ORDER — SODIUM CHLORIDE 0.9 % IJ SOLN
3.0000 mL | Freq: Two times a day (BID) | INTRAMUSCULAR | Status: DC
  Administered 2015-04-15 – 2015-04-18 (×5): 3 mL via INTRAVENOUS

## 2015-04-15 MED ORDER — LORATADINE 10 MG PO TABS
10.0000 mg | ORAL_TABLET | Freq: Every day | ORAL | Status: DC
  Administered 2015-04-16 – 2015-04-19 (×3): 10 mg via ORAL
  Filled 2015-04-15 (×3): qty 1

## 2015-04-15 MED ORDER — FOLIC ACID 1 MG PO TABS
1.0000 mg | ORAL_TABLET | Freq: Two times a day (BID) | ORAL | Status: DC
  Administered 2015-04-16 – 2015-04-19 (×5): 1 mg via ORAL
  Filled 2015-04-15 (×5): qty 1

## 2015-04-15 MED ORDER — ADULT MULTIVITAMIN W/MINERALS CH
1.0000 | ORAL_TABLET | Freq: Every day | ORAL | Status: DC
  Administered 2015-04-16 – 2015-04-19 (×3): 1 via ORAL
  Filled 2015-04-15 (×3): qty 1

## 2015-04-15 MED ORDER — HYDROCODONE-ACETAMINOPHEN 5-325 MG PO TABS
1.0000 | ORAL_TABLET | ORAL | Status: DC | PRN
  Administered 2015-04-16 – 2015-04-17 (×4): 1 via ORAL
  Filled 2015-04-15 (×4): qty 1

## 2015-04-15 MED ORDER — ONDANSETRON HCL 4 MG PO TABS: 4.0000 mg | ORAL_TABLET | Freq: Four times a day (QID) | ORAL | Status: DC | PRN

## 2015-04-15 MED ORDER — SENNOSIDES-DOCUSATE SODIUM 8.6-50 MG PO TABS: 1.0000 | ORAL_TABLET | Freq: Every evening | ORAL | Status: DC | PRN

## 2015-04-15 MED ORDER — MAGNESIUM SULFATE 2 GM/50ML IV SOLN: 2.0000 g | Freq: Once | INTRAVENOUS | Status: DC

## 2015-04-15 MED ORDER — SPIRONOLACTONE 25 MG PO TABS
25.0000 mg | ORAL_TABLET | Freq: Every day | ORAL | Status: DC
  Administered 2015-04-16 – 2015-04-19 (×4): 25 mg via ORAL
  Filled 2015-04-15 (×4): qty 1

## 2015-04-15 MED ORDER — FLUTICASONE PROPIONATE 50 MCG/ACT NA SUSP
2.0000 | Freq: Every day | NASAL | Status: DC
  Administered 2015-04-16 – 2015-04-18 (×2): 2 via NASAL
  Filled 2015-04-15 (×2): qty 16

## 2015-04-15 MED ORDER — MORPHINE SULFATE (PF) 2 MG/ML IV SOLN
2.0000 mg | INTRAVENOUS | Status: DC | PRN
  Administered 2015-04-15 – 2015-04-16 (×2): 2 mg via INTRAVENOUS
  Filled 2015-04-15 (×2): qty 1

## 2015-04-15 MED ORDER — MAGNESIUM CITRATE PO SOLN: 1.0000 | Freq: Once | ORAL | Status: DC | PRN

## 2015-04-15 MED ORDER — ONDANSETRON HCL 4 MG/2ML IJ SOLN
4.0000 mg | Freq: Four times a day (QID) | INTRAMUSCULAR | Status: DC | PRN
  Filled 2015-04-15: qty 2

## 2015-04-15 MED ORDER — ATORVASTATIN CALCIUM 40 MG PO TABS
40.0000 mg | ORAL_TABLET | Freq: Every day | ORAL | Status: DC
  Administered 2015-04-16 – 2015-04-18 (×3): 40 mg via ORAL
  Filled 2015-04-15 (×3): qty 1

## 2015-04-15 MED ORDER — FENTANYL CITRATE (PF) 100 MCG/2ML IJ SOLN
50.0000 ug | Freq: Once | INTRAMUSCULAR | Status: AC
  Administered 2015-04-15: 50 ug via INTRAVENOUS
  Filled 2015-04-15: qty 2

## 2015-04-15 MED ORDER — LEVOTHYROXINE SODIUM 75 MCG PO TABS
75.0000 ug | ORAL_TABLET | Freq: Every day | ORAL | Status: DC
Start: 2015-04-16 — End: 2015-04-19
  Administered 2015-04-16 – 2015-04-19 (×4): 75 ug via ORAL
  Filled 2015-04-15 (×5): qty 1

## 2015-04-15 MED ORDER — METOPROLOL TARTRATE 25 MG PO TABS
25.0000 mg | ORAL_TABLET | Freq: Once | ORAL | Status: AC
  Administered 2015-04-15: 25 mg via ORAL
  Filled 2015-04-15: qty 1

## 2015-04-15 MED ORDER — METOPROLOL TARTRATE 25 MG PO TABS
25.0000 mg | ORAL_TABLET | Freq: Two times a day (BID) | ORAL | Status: DC
  Administered 2015-04-16 – 2015-04-18 (×5): 25 mg via ORAL
  Filled 2015-04-15 (×5): qty 1

## 2015-04-15 NOTE — ED Provider Notes (Signed)
CSN: [id number]     Arrival date & time 04/15/15  1617 History   First MD Initiated Contact with Patient 04/15/15 1626     Chief Complaint  Patient presents with  . Fall     (Consider location/radiation/quality/duration/timing/severity/associated sxs/prior Treatment) HPI Comments: 81 year old female with coronary artery disease, aortic valve stenosis, atrial fibrillation on Coumadin, left bundle-branch block, lymphoma presents after fall onto her right hip onto concrete floor. Patient remembers all details felt when she to the step she lost her balance. No loss of consciousness, no head injury. Pain with any movement the right hip pain no history of right hip fracture. Patient is very functional and healthy for her age. This occurred prior to arrival. Patient received fentanyl and route.  Patient is a 81 y.o. female presenting with fall. The history is provided by the patient.  Fall Pertinent negatives include no chest pain, no abdominal pain, no headaches and no shortness of breath.    Past Medical History  Diagnosis Date  . Chronic diastolic CHF (congestive heart failure) ([place])     a. 10/2012 Echo: EF 55-60%, no rwma, Gr 2 DD, moderate AS, mod dil LA, mildly to mod dil RA.  Marland Kitchen Hypothyroidism   . Dyslipidemia   . Hypertension   . PAF (paroxysmal atrial fibrillation) ([place])     a. CHA2DS2VASc = 7-->chronic coumadin;  b. s/p DCCV 05/2011, 05/2012, 07/2014;  c. On amio.  Marland Kitchen  Acute respiratiory failure requiring BiPap 06/24/2011  . Aortic valvular stenosis, moderate to severe 06/24/2011    a. 10/2012 Echo: mod AS, Valve 1.25 cm^2 (VTI), 1.19cm^2 (Vmax).  . Allergy   . Coronary artery disease     a. 03/2009 PCI RCA (3.0x18 BMS).  . Exertional shortness of breath   . Arthritis     "back" (10/30/2012)  . Rash     a. felt to be 2/2 lasix/torsemide in setting of sulfa allergy (lasix d/c ~ 06/2014, torsemide d/c 08/09/2014).   Past Surgical History  Procedure Laterality Date  . Cardioversion   06/19/2011    Procedure: CARDIOVERSION;  Surgeon: Sanda Klein, MD;  Location: [facility];  Service: Cardiovascular;  Laterality: N/A;  . Appendectomy    . Angioplasty    . Cataract extraction w/ intraocular lens  implant, bilateral    . Lumbar disc surgery  X 2    "cleaned out scar tissue"   . Coronary angioplasty with stent placement      "1" (10/30/2012)  . Dilation and curettage of uterus    . Excisional hemorrhoidectomy    . Wrist fracture surgery Left   . Shoulder acromioplasty Left     "fell; put a new ball in" (10/30/2012)  . Cardioversion  07/11/2010    Successful coversion to sinus rhythm  . Cardiac catheterization  10/06/2009    Continue medical therapy  . Cardiac catheterization  04/07/2009    RCA stented with a 3x28m stent resulting in a reduction of 90% narrowing to normal  . Cardioversion N/A 08/10/2014    Procedure: CARDIOVERSION;  Surgeon: MSanda Klein MD;  Location: [facility];  Service: Cardiovascular;  Laterality: N/A;  . Back surgery    . Fracture surgery     Family History  Problem Relation Age of Onset  . Heart disease Brother   . Heart disease Brother   . Lung disease Brother   . Heart disease Mother 632 . Cancer Mother 61   Uterine  . Stroke Mother 668 . Heart disease Father   .  Hypertension Child 34  . Alcohol abuse Child   . Heart attack Father   . Heart failure Mother   . Cancer Son   . Hypertension Mother   . Hypertension Father   . Hypertension Sister   . Hypertension Brother    Social History  Substance Use Topics  . Smoking status: Never Smoker   . Smokeless tobacco: Never Used  . Alcohol Use: No   OB History    No data available     Review of Systems  Constitutional: Negative for fever and chills.  HENT: Negative for congestion.   Eyes: Negative for visual disturbance.  Respiratory: Negative for shortness of breath.   Cardiovascular: Negative for chest pain.  Gastrointestinal: Negative for vomiting and abdominal pain.   Genitourinary: Negative for dysuria and flank pain.  Musculoskeletal: Positive for arthralgias and gait problem. Negative for back pain, neck pain and neck stiffness.  Skin: Negative for rash.  Neurological: Negative for light-headedness and headaches.      Allergies  [name]; Tape; Furosemide; Sulfa antibiotics; [name]; and Torsemide  Home Medications   Prior to Admission medications   Medication Sig Start Date End Date Taking? Authorizing Provider  acetaminophen (TYLENOL) 650 MG CR tablet Take 650 mg by mouth 3 (three) times daily. Morning, supper and bedtime   Yes Historical Provider, MD  albuterol (PROVENTIL HFA;VENTOLIN HFA) 108 (90 BASE) MCG/ACT inhaler Inhale 2 puffs into the lungs every 6 (six) hours as needed for wheezing or shortness of breath.   Yes Historical Provider, MD  atorvastatin (LIPITOR) 40 MG tablet TAKE 1 TABLET DAILY Patient taking differently: TAKE 1 TABLET BY MOUTH DAILY WITH SUPPER 11/04/14  Yes Mihai Croitoru, MD  fluticasone (FLONASE) 50 MCG/ACT nasal spray Place 2 sprays into both nostrils daily. Use as directed 11/24/14  Yes Historical Provider, MD  folic acid (FOLVITE) 1 MG tablet Take 2 tablets (2 mg total) by mouth daily. Patient taking differently: Take 1 mg by mouth 2 (two) times daily with a meal.  03/20/15  Yes Mihai Croitoru, MD  ipratropium (ATROVENT) 0.03 % nasal spray Place 2 sprays into both nostrils 2 (two) times daily. Use as directed 11/24/14  Yes Historical Provider, MD  levothyroxine (SYNTHROID, LEVOTHROID) 75 MCG tablet Take 1 tablet (75 mcg total) by mouth daily before breakfast. 02/09/15  Yes Almyra Deforest, PA  loratadine (CLARITIN) 10 MG tablet Take 10 mg by mouth daily with breakfast.    Yes Historical Provider, MD  metoprolol tartrate (LOPRESSOR) 25 MG tablet Take 1 tablet (25 mg total) by mouth 2 (two) times daily. Patient taking differently: Take 25 mg by mouth 2 (two) times daily with a meal.  03/20/15  Yes Mihai Croitoru, MD   Multiple Vitamin (MULTIVITAMIN WITH MINERALS) TABS tablet Take 1 tablet by mouth daily. Centrum Silver   Yes Historical Provider, MD  Polyvinyl Alcohol-Povidone (TEARS PLUS OP) Place 1 drop into both eyes at bedtime.    Yes Historical Provider, MD  spironolactone (ALDACTONE) 25 MG tablet Take 1 tablet (25 mg total) by mouth daily. 01/06/15  Yes Mihai Croitoru, MD  triamcinolone cream (KENALOG) 0.1 % Apply 1 application topically daily as needed (rash).  04/12/15  Yes Historical Provider, MD  warfarin (COUMADIN) 1 MG tablet Take 2 tablets by mouth daily or as directed by coumadin clinic Patient taking differently: Take 2 mg by mouth daily with supper.  02/06/15  Yes Mihai Croitoru, MD   BP 160/102 mmHg  Pulse 108  Temp(Src) 98.2 F (36.8 C) (  Oral)  Resp 16  Ht '5\' 3"'$  (1.6 m)  Wt 131 lb (59.42 kg)  BMI 23.21 kg/m2  SpO2 87% Physical Exam  Constitutional: She is oriented to person, place, and time. She appears well-developed and well-nourished.  HENT:  Head: Normocephalic and atraumatic.  Eyes: Conjunctivae are normal. Right eye exhibits no discharge. Left eye exhibits no discharge.  Neck: Normal range of motion. Neck supple. No tracheal deviation present.  Cardiovascular: Normal rate and regular rhythm.   Pulmonary/Chest: Effort normal and breath sounds normal.  Abdominal: Soft. She exhibits no distension. There is no tenderness. There is no guarding.  Musculoskeletal: She exhibits tenderness. She exhibits no edema.  Patient has significant tenderness with any movement the right hip, shortened internally rotated. Neurovascular intact distal extremities, compartment soft. Neck supple no midline tenderness.  Neurological: She is alert and oriented to person, place, and time. No cranial nerve deficit.  Skin: Skin is warm. No rash noted.  Psychiatric: She has a normal mood and affect.  Nursing note and vitals reviewed.   ED Course  Procedures (including critical care time) Labs  Review Labs Reviewed  BASIC METABOLIC PANEL - Abnormal; Notable for the following:    Glucose, Bld 123 (*)    GFR calc non Af Amer 57 (*)    All other components within normal limits  CBC WITH DIFFERENTIAL/PLATELET - Abnormal; Notable for the following:    WBC 12.7 (*)    Hemoglobin 11.8 (*)    Neutro Abs 9.1 (*)    All other components within normal limits  PROTIME-INR - Abnormal; Notable for the following:    Prothrombin Time 21.1 (*)    INR 1.83 (*)    All other components within normal limits  CBC WITH DIFFERENTIAL/PLATELET - Abnormal; Notable for the following:    WBC 16.8 (*)    Neutro Abs 14.6 (*)    Basophils Absolute 0.2 (*)    All other components within normal limits  MAGNESIUM  PHOSPHORUS  PROTIME-INR  COMPREHENSIVE METABOLIC PANEL  TYPE AND SCREEN  TYPE AND SCREEN    Imaging Review Dg Chest 1 View  04/15/2015  CLINICAL DATA:  Patient with hip pain status post fall. No chest pain. EXAM: CHEST 1 VIEW COMPARISON:  Chest radiograph 02/07/2015 FINDINGS: Stable cardiomegaly. Unchanged bilateral coarse interstitial pulmonary opacities. Stable biapical pleural parenchymal thickening. No large area of pulmonary consolidation. Stable blunting of the costophrenic angles bilaterally. Left shoulder arthroplasty. Mid thoracic spine degenerative changes. IMPRESSION: No acute cardiopulmonary process. Stable coarse interstitial opacities. Electronically Signed   By: Lovey Newcomer M.D.   On: 04/15/2015 19:03   Dg Hip Unilat With Pelvis 2-3 Views Right  04/15/2015  CLINICAL DATA:  Lateral right hip pain status post fall. EXAM: DG HIP (WITH OR WITHOUT PELVIS) 2-3V RIGHT COMPARISON:  None. FINDINGS: There is generalized osteopenia. There is a mildly displaced right femoral neck fracture. There is no dislocation. There is no other fracture or dislocation. There is peripheral vascular atherosclerotic disease. IMPRESSION: Mildly displaced right femoral neck fracture. Electronically Signed   By:  Kathreen Devoid   On: 04/15/2015 19:01   Dg Femur 1v Right  04/15/2015  CLINICAL DATA:  Fall in garage today. Right hip pain. Initial encounter. EXAM: RIGHT FEMUR 1 VIEW COMPARISON:  None. FINDINGS: Mildly displaced right femoral neck fracture is seen. No evidence hip dislocation. No evidence of distal femur fracture. Generalized osteopenia noted. Old fracture deformity of right inferior pubic ramus noted. Peripheral vascular calcification also demonstrated. IMPRESSION: Acute  right femoral neck fracture. Electronically Signed   By: Earle Gell M.D.   On: 04/15/2015 19:01   I have personally reviewed and evaluated these images and lab results as part of my medical decision-making.   EKG Interpretation None      MDM   Final diagnoses:  Femoral neck fracture, right, closed, initial encounter  Fall, initial encounter   Patient presents with clinically right hip fracture. Blood work, preop and screening labs ordered. Paged orthopedic and hospitalist for admission.  The patients results and plan were reviewed and discussed.   Any x-rays performed were independently reviewed by myself.   Differential diagnosis were considered with the presenting HPI.  Medications  morphine 2 MG/ML injection 2 mg (2 mg Intravenous Given 04/15/15 2145)  0.9 %  sodium chloride infusion (1,000 mLs Intravenous New Bag/Given 04/15/15 2145)  multivitamin with minerals tablet 1 tablet (not administered)  folic acid (FOLVITE) tablet 1 mg (not administered)  metoprolol tartrate (LOPRESSOR) tablet 25 mg (not administered)  levothyroxine (SYNTHROID, LEVOTHROID) tablet 75 mcg (not administered)  spironolactone (ALDACTONE) tablet 25 mg (not administered)  fluticasone (FLONASE) 50 MCG/ACT nasal spray 2 spray (not administered)  ipratropium (ATROVENT) 0.03 % nasal spray 2 spray (2 sprays Each Nare Given 04/15/15 2200)  atorvastatin (LIPITOR) tablet 40 mg (40 mg Oral Not Given 04/15/15 2146)  loratadine (CLARITIN) tablet  10 mg (not administered)  acetaminophen (TYLENOL) tablet 650 mg (650 mg Oral Given 04/15/15 2143)  sodium chloride 0.9 % injection 3 mL (3 mLs Intravenous Given 04/15/15 2146)  ondansetron (ZOFRAN) tablet 4 mg (not administered)    Or  ondansetron (ZOFRAN) injection 4 mg (not administered)  magnesium citrate solution 1 Bottle (not administered)  senna-docusate (Senokot-S) tablet 1 tablet (not administered)  HYDROcodone-acetaminophen (NORCO/VICODIN) 5-325 MG per tablet 1 tablet (not administered)  fentaNYL (SUBLIMAZE) injection 50 mcg (50 mcg Intravenous Given 04/15/15 1756)  metoprolol tartrate (LOPRESSOR) tablet 25 mg (25 mg Oral Given 04/15/15 2144)    Filed Vitals:   04/15/15 2000 04/15/15 2045 04/15/15 2059 04/15/15 2125  BP: 145/100 163/93  160/102  Pulse: 114 100  108  Temp:   98.8 F (37.1 C) 98.2 F (36.8 C)  TempSrc:   Oral Oral  Resp:    16  Height:    '5\' 3"'$  (1.6 m)  Weight:    131 lb (59.42 kg)  SpO2: 95% 94%  87%    Final diagnoses:  Femoral neck fracture, right, closed, initial encounter  Fall, initial encounter    Admission/ observation were discussed with the admitting physician, patient and/or family and they are comfortable with the plan.      Elnora Morrison, MD 04/16/15 [phone]

## 2015-04-15 NOTE — H&P (Signed)
[facility] History and Physical  [name] ZJI:[id number] DOB: Jun 21, 1923 DOA: 04/15/2015  Referring physician: Elnora Morrison, MD PCP: Wyatt Haste, MD   Chief Complaint:   HPI: [name] is a 81 y.o. female with a past medical history of chronic diastolic CHF, hypothyroidism, hyperlipidemia, hypertension, paroxysmal atrial fibrillation, CAD, osteoarthritis, who was brought to the emergency department via EMS after having a mechanical fall at home landing on her right hip area, which produced immediate pain and decreased range of motion. Per patient, this was purely accidental. She denies dizziness, chest pain, palpitations, diaphoresis or dyspnea prior to the fall. She denies PND, orthopnea, but occasionally gets mild pitting edema of the lower extremities by the end of the day. The patient states that the edema usually resolves by the next day in the morning.  Workup in the emergency department shows a close right hip fracture. Dr. Reather Converse has already contacted the orthopedic surgery service on call, which requested medical admission.  When seen, the patient rated her pain as a 5 out of 10, but stated that she just has to use the bedpan. She declined further analgesics at that moment. She was in no acute distress.   Review of Systems:  Constitutional:  No weight loss, night sweats, Fevers, chills, fatigue.  HEENT:  No headaches, Difficulty swallowing,Tooth/dental problems,Sore throat,  No sneezing, itching, ear ache, nasal congestion, post nasal drip,  Cardio-vascular:  Occasional palpitations, but none today. GI:  No heartburn, indigestion, abdominal pain, nausea, vomiting, diarrhea, change in bowel habits, loss of appetite  Resp:  Denies dyspnea, cough, hemoptysis or wheezing. Skin:  no rash or lesions.  GU:  no dysuria, change in color of urine, no urgency or frequency. No flank pain.  Musculoskeletal:  Tenderness in right hip area with decreased  range of motion. Chronic back pain.  Psych:  No change in mood or affect. No depression or anxiety. No memory loss.   Past Medical History  Diagnosis Date  . Chronic diastolic CHF (congestive heart failure) ([place])     a. 10/2012 Echo: EF 55-60%, no rwma, Gr 2 DD, moderate AS, mod dil LA, mildly to mod dil RA.  Marland Kitchen Hypothyroidism   . Dyslipidemia   . Hypertension   . PAF (paroxysmal atrial fibrillation) ([place])     a. CHA2DS2VASc = 7-->chronic coumadin;  b. s/p DCCV 05/2011, 05/2012, 07/2014;  c. On amio.  Marland Kitchen  Acute respiratiory failure requiring BiPap 06/24/2011  . Aortic valvular stenosis, moderate to severe 06/24/2011    a. 10/2012 Echo: mod AS, Valve 1.25 cm^2 (VTI), 1.19cm^2 (Vmax).  . Allergy   . Coronary artery disease     a. 03/2009 PCI RCA (3.0x18 BMS).  . Exertional shortness of breath   . Arthritis     "back" (10/30/2012)  . Rash     a. felt to be 2/2 lasix/torsemide in setting of sulfa allergy (lasix d/c ~ 06/2014, torsemide d/c 08/09/2014).   Past Surgical History  Procedure Laterality Date  . Cardioversion  06/19/2011    Procedure: CARDIOVERSION;  Surgeon: Sanda Klein, MD;  Location: [facility];  Service: Cardiovascular;  Laterality: N/A;  . Appendectomy    . Angioplasty    . Cataract extraction w/ intraocular lens  implant, bilateral    . Lumbar disc surgery  X 2    "cleaned out scar tissue"   . Coronary angioplasty with stent placement      "1" (10/30/2012)  . Dilation and curettage of uterus    .  Excisional hemorrhoidectomy    . Wrist fracture surgery Left   . Shoulder acromioplasty Left     "fell; put a new ball in" (10/30/2012)  . Cardioversion  07/11/2010    Successful coversion to sinus rhythm  . Cardiac catheterization  10/06/2009    Continue medical therapy  . Cardiac catheterization  04/07/2009    RCA stented with a 3x1m stent resulting in a reduction of 90% narrowing to normal  . Cardioversion N/A 08/10/2014    Procedure: CARDIOVERSION;  Surgeon: MSanda Klein MD;   Location: [facility];  Service: Cardiovascular;  Laterality: N/A;  . Back surgery    . Fracture surgery     Social History:  reports that she has never smoked. She has never used smokeless tobacco. She reports that she does not drink alcohol or use illicit drugs.  Allergies  Allergen Reactions  . [name] [Ethacrynic Acid] Nausea And Vomiting  . Tape Other (See Comments)    Tears skin off.  Please use "paper" tape only.  . Furosemide Rash  . Sulfa Antibiotics Itching and Rash  . [name] Rash    Per Dr CSallyanne Kuster had a skin reaction with thiazides prior to use of Lasix  . Torsemide Rash    Family History  Problem Relation Age of Onset  . Heart disease Brother   . Heart disease Brother   . Lung disease Brother   . Heart disease Mother 633 . Cancer Mother 66   Uterine  . Stroke Mother 633 . Heart disease Father   . Hypertension Child 34  . Alcohol abuse Child   . Heart attack Father   . Heart failure Mother   . Cancer Son   . Hypertension Mother   . Hypertension Father   . Hypertension Sister   . Hypertension Brother     Prior to Admission medications   Medication Sig Start Date End Date Taking? Authorizing Provider  acetaminophen (TYLENOL) 650 MG CR tablet Take 650 mg by mouth 3 (three) times daily. Morning, supper and bedtime   Yes Historical Provider, MD  albuterol (PROVENTIL HFA;VENTOLIN HFA) 108 (90 BASE) MCG/ACT inhaler Inhale 2 puffs into the lungs every 6 (six) hours as needed for wheezing or shortness of breath.   Yes Historical Provider, MD  atorvastatin (LIPITOR) 40 MG tablet TAKE 1 TABLET DAILY Patient taking differently: TAKE 1 TABLET BY MOUTH DAILY WITH SUPPER 11/04/14  Yes Mihai Croitoru, MD  fluticasone (FLONASE) 50 MCG/ACT nasal spray Place 2 sprays into both nostrils daily. Use as directed 11/24/14  Yes Historical Provider, MD  folic acid (FOLVITE) 1 MG tablet Take 2 tablets (2 mg total) by mouth daily. Patient taking differently: Take 1 mg  by mouth 2 (two) times daily with a meal.  03/20/15  Yes Mihai Croitoru, MD  ipratropium (ATROVENT) 0.03 % nasal spray Place 2 sprays into both nostrils 2 (two) times daily. Use as directed 11/24/14  Yes Historical Provider, MD  levothyroxine (SYNTHROID, LEVOTHROID) 75 MCG tablet Take 1 tablet (75 mcg total) by mouth daily before breakfast. 02/09/15  Yes HAlmyra Deforest PA  loratadine (CLARITIN) 10 MG tablet Take 10 mg by mouth daily with breakfast.    Yes Historical Provider, MD  metoprolol tartrate (LOPRESSOR) 25 MG tablet Take 1 tablet (25 mg total) by mouth 2 (two) times daily. Patient taking differently: Take 25 mg by mouth 2 (two) times daily with a meal.  03/20/15  Yes Mihai Croitoru, MD  Multiple Vitamin (MULTIVITAMIN WITH MINERALS) TABS  tablet Take 1 tablet by mouth daily. Centrum Silver   Yes Historical Provider, MD  Polyvinyl Alcohol-Povidone (TEARS PLUS OP) Place 1 drop into both eyes at bedtime.    Yes Historical Provider, MD  spironolactone (ALDACTONE) 25 MG tablet Take 1 tablet (25 mg total) by mouth daily. 01/06/15  Yes Mihai Croitoru, MD  triamcinolone cream (KENALOG) 0.1 % Apply 1 application topically daily as needed (rash).  04/12/15  Yes Historical Provider, MD  warfarin (COUMADIN) 1 MG tablet Take 2 tablets by mouth daily or as directed by coumadin clinic Patient taking differently: Take 2 mg by mouth daily with supper.  02/06/15  Yes Mihai Croitoru, MD   Physical Exam: Filed Vitals:   04/15/15 1621 04/15/15 1625 04/15/15 1645 04/15/15 1715  BP:  156/89 130/88 151/79  Pulse:  89 74 73  Temp:  97.5 F (36.4 C)    TempSrc:  Oral    Resp:   13 15  Height:  '5\' 3"'$  (1.6 m)    Weight:  59.421 kg (131 lb)    SpO2: 99% 93% 94% 95%    Wt Readings from Last 3 Encounters:  04/15/15 59.421 kg (131 lb)  04/13/15 60.782 kg (134 lb)  04/09/15 59.875 kg (132 lb)    General:  Appears calm and comfortable Eyes: PERRL, normal lids, irises & conjunctiva ENT: grossly normal hearing, lips and  oral mucosa are moist. Neck: no LAD, masses or thyromegaly Cardiovascular: Irregularly irregular, positive systolic murmur 4/6. Trace LE edema. Telemetry: Atrial fibrillation Respiratory: CTA bilaterally, no w/r/r. Normal respiratory effort. Abdomen: soft, ntnd Skin: no rash or induration seen on limited exam Musculoskeletal: Positive right hip tenderness. Shortened right lower extremity with decreased range of motion. Distal pulses are palpable and the patient has good capillary refill. Psychiatric: grossly normal mood and affect, speech fluent and appropriate Neurologic: Awake and oriented 3, grossly non-focal.          Labs on Admission:  Basic Metabolic Panel:  Recent Labs Lab 04/15/15 1752  NA 139  K 4.9  CL 108  CO2 24  GLUCOSE 123*  BUN 17  CREATININE 0.87  CALCIUM 9.0   CBC:  Recent Labs Lab 04/15/15 1752  WBC 12.7*  NEUTROABS 9.1*  HGB 11.8*  HCT 37.7  MCV 95.9  PLT 212    BNP (last 3 results)  Recent Labs  08/14/14 0610 02/07/15 0845  BNP 208.2* 279.0*     Radiological Exams on Admission: Dg Chest 1 View  04/15/2015  CLINICAL DATA:  Patient with hip pain status post fall. No chest pain. EXAM: CHEST 1 VIEW COMPARISON:  Chest radiograph 02/07/2015 FINDINGS: Stable cardiomegaly. Unchanged bilateral coarse interstitial pulmonary opacities. Stable biapical pleural parenchymal thickening. No large area of pulmonary consolidation. Stable blunting of the costophrenic angles bilaterally. Left shoulder arthroplasty. Mid thoracic spine degenerative changes. IMPRESSION: No acute cardiopulmonary process. Stable coarse interstitial opacities. Electronically Signed   By: Lovey Newcomer M.D.   On: 04/15/2015 19:03   Dg Hip Unilat With Pelvis 2-3 Views Right  04/15/2015  CLINICAL DATA:  Lateral right hip pain status post fall. EXAM: DG HIP (WITH OR WITHOUT PELVIS) 2-3V RIGHT COMPARISON:  None. FINDINGS: There is generalized osteopenia. There is a mildly displaced  right femoral neck fracture. There is no dislocation. There is no other fracture or dislocation. There is peripheral vascular atherosclerotic disease. IMPRESSION: Mildly displaced right femoral neck fracture. Electronically Signed   By: Kathreen Devoid   On: 04/15/2015 19:01   Dg Femur  1v Right  04/15/2015  CLINICAL DATA:  Fall in garage today. Right hip pain. Initial encounter. EXAM: RIGHT FEMUR 1 VIEW COMPARISON:  None. FINDINGS: Mildly displaced right femoral neck fracture is seen. No evidence hip dislocation. No evidence of distal femur fracture. Generalized osteopenia noted. Old fracture deformity of right inferior pubic ramus noted. Peripheral vascular calcification also demonstrated. IMPRESSION: Acute right femoral neck fracture. Electronically Signed   By: Earle Gell M.D.   On: 04/15/2015 19:01   Echocardiogram:  02/08/2015.  ------------------------------------------------------------------- LV EF: 55% -  60%  ------------------------------------------------------------------- Indications:   CHF - 428.0.  ------------------------------------------------------------------- History:  PMH:  Coronary artery disease. Risk factors: Hypertension. Dyslipidemia.  ------------------------------------------------------------------- Study Conclusions  - Left ventricle: The cavity size was normal. Wall thickness was normal. Systolic function was normal. The estimated ejection fraction was in the range of 55% to 60%. - Aortic valve: There was moderate stenosis. There was trivial regurgitation. Valve area (VTI): 1.1 cm^2. Valve area (Vmax): 1.05 cm^2. Valve area (Vmean): 1.08 cm^2. - Mitral valve: Calcified annulus. Mildly thickened leaflets . There was mild regurgitation. Valve area by continuity equation (using LVOT flow): 1.53 cm^2. - Left atrium: The atrium was mildly dilated. - Right atrium: The atrium was mildly dilated. - Atrial septum: No defect or patent foramen  ovale was identified.  EKG: Independently reviewed. Order and still pending.  Assessment/Plan Principal Problem:   Closed right hip fracture (HCC) Admit to telemetry. Orthopedic surgery has been consulted by the emergency department. Hold warfarin and monitor PT/INR. Continue analgesics and antiemetics as needed.  Active Problems:   Chronic diastolic heart failure (HCC)     Coronary artery disease   Aortic valvular stenosis, moderate to severe Continue metoprolol 25 mg by mouth twice a day. Continue spironolactone. Decrease IV fluids to 25 ml/hour to [facility]. Recent echocardiogram done 2 months ago. Hold warfarin due to scheduled surgery.       Long term current use of anticoagulant therapy    PAF (paroxysmal atrial fibrillation) (HCC) Continue metoprolol by mouth twice a day for rate control. Check magnesium level. I will order magnesium sulfate 2 g IV piggyback prophylactically since the patient's heart rate occasionally goes to the 120s. Keep electrolytes optimized.    Hyperlipidemia Continue atorvastatin. Monitor LFTs periodically.    HTN (hypertension) Continue metoprolol and spironolactone. Monitor blood pressure periodically.    Hypothyroidism Continue levothyroxine. Check TSH level.    Orthopedic surgery was consulted by the emergency department.  Code Status: Full code.  DVT Prophylaxis: On Warfarin at home. Held prior to arthroplasty.  Family Communication: Disposition Plan: Admit to telemetry. Orthopedic surgery evaluation.   Time spent: Over 70 minutes were spent during the process of this admission.  Reubin Milan [facility] Pager [phone].

## 2015-04-15 NOTE — ED Notes (Signed)
Attempted report.  Nurse to call back when available. 

## 2015-04-15 NOTE — ED Notes (Addendum)
Pt arrives EMS with c/o fall from standing onto concrete floor about 1530 landing on Right hip. Pt arrives with c/o pain at Right hip,  Right leg externally rotated and some shortening. Pt received 147mg fentanyl iv by EMS PTA

## 2015-04-16 DIAGNOSIS — I5032 Chronic diastolic (congestive) heart failure: Secondary | ICD-10-CM

## 2015-04-16 DIAGNOSIS — I350 Nonrheumatic aortic (valve) stenosis: Secondary | ICD-10-CM

## 2015-04-16 DIAGNOSIS — Z01818 Encounter for other preprocedural examination: Secondary | ICD-10-CM

## 2015-04-16 DIAGNOSIS — E038 Other specified hypothyroidism: Secondary | ICD-10-CM

## 2015-04-16 LAB — SURGICAL PCR SCREEN
MRSA, PCR: NEGATIVE
Staphylococcus aureus: NEGATIVE

## 2015-04-16 LAB — COMPREHENSIVE METABOLIC PANEL
ALBUMIN: 3.3 g/dL — AB (ref 3.5–5.0)
ALK PHOS: 72 U/L (ref 38–126)
ALT: 16 U/L (ref 14–54)
ANION GAP: 7 (ref 5–15)
AST: 21 U/L (ref 15–41)
BILIRUBIN TOTAL: 0.9 mg/dL (ref 0.3–1.2)
BUN: 16 mg/dL (ref 6–20)
CALCIUM: 8.5 mg/dL — AB (ref 8.9–10.3)
CO2: 25 mmol/L (ref 22–32)
Chloride: 105 mmol/L (ref 101–111)
Creatinine, Ser: 0.85 mg/dL (ref 0.44–1.00)
GFR calc Af Amer: >60 mL/min (ref >60)
GFR, EST NON AFRICAN AMERICAN: 59 mL/min — AB (ref >60)
GLUCOSE: 123 mg/dL — AB (ref 65–99)
POTASSIUM: 4.3 mmol/L (ref 3.5–5.1)
Sodium: 137 mmol/L (ref 135–145)
TOTAL PROTEIN: 6.5 g/dL (ref 6.5–8.1)

## 2015-04-16 LAB — PROTIME-INR
INR: 1.78 — ABNORMAL HIGH (ref 0.00–1.49)
PROTHROMBIN TIME: 20.7 s — AB (ref 11.6–15.2)

## 2015-04-16 MED ORDER — IPRATROPIUM BROMIDE 0.06 % NA SOLN
2.0000 | Freq: Two times a day (BID) | NASAL | Status: DC
  Administered 2015-04-17 – 2015-04-19 (×4): 2 via NASAL
  Filled 2015-04-16: qty 15

## 2015-04-16 NOTE — Progress Notes (Signed)
[facility] PROGRESS NOTE  ZAHLI VETSCH RDE:[id number] DOB: 08-Feb-1924 DOA: 04/15/2015  PCP: Wyatt Haste, MD  Brief HPI: 81 year old Caucasian female with a past medical history of chronic diastolic CHF, hypothyroidism, hyperlipidemia, paroxysmal atrial fibrillation, coronary artery disease, presented after a mechanical fall at home resulting in fracture of the right hip. She was hospitalized for further management.  Past medical history:  Past Medical History  Diagnosis Date  . Chronic diastolic CHF (congestive heart failure) ([place])     a. 10/2012 Echo: EF 55-60%, no rwma, Gr 2 DD, moderate AS, mod dil LA, mildly to mod dil RA.  Marland Kitchen Hypothyroidism   . Dyslipidemia   . Hypertension   . PAF (paroxysmal atrial fibrillation) ([place])     a. CHA2DS2VASc = 7-->chronic coumadin;  b. s/p DCCV 05/2011, 05/2012, 07/2014;  c. On amio.  Marland Kitchen  Acute respiratiory failure requiring BiPap 06/24/2011  . Aortic valvular stenosis, moderate to severe 06/24/2011    a. 10/2012 Echo: mod AS, Valve 1.25 cm^2 (VTI), 1.19cm^2 (Vmax).  . Allergy   . Coronary artery disease     a. 03/2009 PCI RCA (3.0x18 BMS).  . Exertional shortness of breath   . Arthritis     "back" (10/30/2012)  . Rash     a. felt to be 2/2 lasix/torsemide in setting of sulfa allergy (lasix d/c ~ 06/2014, torsemide d/c 08/09/2014).    Consultants: Orthopedics  Procedures: None yet  Antibiotics: None  Subjective: Patient has hip pain only when she has to be moved. Denies any chest pain, shortness of breath, nausea or vomiting. She states that she is very active at home. Walks for at least 45 minutes every day.  Objective: Vital Signs  Filed Vitals:   04/15/15 2125 04/16/15 0000 04/16/15 0214 04/16/15 0450  BP: 160/102   123/79  Pulse: 108  104 90  Temp: 98.2 F (36.8 C)   97.6 F (36.4 C)  TempSrc: Oral   Oral  Resp: 16   16  Height: '5\' 3"'$  (1.6 m)     Weight: 59.42 kg (131 lb)     SpO2: 87% 98% 98% 100%     Intake/Output Summary (Last 24 hours) at 04/16/15 0827 Last data filed at 04/16/15 0600  Gross per 24 hour  Intake 446.25 ml  Output    500 ml  Net -53.75 ml   Filed Weights   04/15/15 1625 04/15/15 2125  Weight: 59.421 kg (131 lb) 59.42 kg (131 lb)    General appearance: alert, cooperative, appears stated age and no distress Resp: clear to auscultation bilaterally Cardio: S1, S2 irregularly irregular. Systolic murmur appreciated over the aortic area. No S3, S4. No rubs, or bruit. GI: soft, non-tender; bowel sounds normal; no masses,  no organomegaly Extremities: Right lower extremity is externally rotated. Neurologic: Alert and oriented 3. Cranial nerves II-12 intact.  Lab Results:  Basic Metabolic Panel:  Recent Labs Lab 04/15/15 1752 04/15/15 2053 04/16/15 0200  NA 139  --  137  K 4.9  --  4.3  CL 108  --  105  CO2 24  --  25  GLUCOSE 123*  --  123*  BUN 17  --  16  CREATININE 0.87  --  0.85  CALCIUM 9.0  --  8.5*  MG  --  2.2  --   PHOS  --  3.7  --    Liver Function Tests:  Recent Labs Lab 04/16/15 0200  AST 21  ALT 16  ALKPHOS 72  BILITOT 0.9  PROT 6.5  ALBUMIN 3.3*   CBC:  Recent Labs Lab 04/15/15 1752 04/15/15 2213  WBC 12.7* 16.8*  NEUTROABS 9.1* 14.6*  HGB 11.8* 12.5  HCT 37.7 39.2  MCV 95.9 94.7  PLT 212 232   CBG: No results for input(s): GLUCAP in the last 168 hours.  Recent Results (from the past 240 hour(s))  Surgical pcr screen     Status: None   Collection Time: 04/16/15 12:33 AM  Result Value Ref Range Status   MRSA, PCR NEGATIVE NEGATIVE Final   Staphylococcus aureus NEGATIVE NEGATIVE Final    Comment:        The Xpert SA Assay (FDA approved for NASAL specimens in patients over 66 years of age), is one component of a comprehensive surveillance program.  Test performance has been validated by [facility] for patients greater than or equal to 65 year old. It is not intended to diagnose infection nor to guide  or monitor treatment.       Studies/Results: Dg Chest 1 View  04/15/2015  CLINICAL DATA:  Patient with hip pain status post fall. No chest pain. EXAM: CHEST 1 VIEW COMPARISON:  Chest radiograph 02/07/2015 FINDINGS: Stable cardiomegaly. Unchanged bilateral coarse interstitial pulmonary opacities. Stable biapical pleural parenchymal thickening. No large area of pulmonary consolidation. Stable blunting of the costophrenic angles bilaterally. Left shoulder arthroplasty. Mid thoracic spine degenerative changes. IMPRESSION: No acute cardiopulmonary process. Stable coarse interstitial opacities. Electronically Signed   By: Lovey Newcomer M.D.   On: 04/15/2015 19:03   Dg Hip Unilat With Pelvis 2-3 Views Right  04/15/2015  CLINICAL DATA:  Lateral right hip pain status post fall. EXAM: DG HIP (WITH OR WITHOUT PELVIS) 2-3V RIGHT COMPARISON:  None. FINDINGS: There is generalized osteopenia. There is a mildly displaced right femoral neck fracture. There is no dislocation. There is no other fracture or dislocation. There is peripheral vascular atherosclerotic disease. IMPRESSION: Mildly displaced right femoral neck fracture. Electronically Signed   By: Kathreen Devoid   On: 04/15/2015 19:01   Dg Femur 1v Right  04/15/2015  CLINICAL DATA:  Fall in garage today. Right hip pain. Initial encounter. EXAM: RIGHT FEMUR 1 VIEW COMPARISON:  None. FINDINGS: Mildly displaced right femoral neck fracture is seen. No evidence hip dislocation. No evidence of distal femur fracture. Generalized osteopenia noted. Old fracture deformity of right inferior pubic ramus noted. Peripheral vascular calcification also demonstrated. IMPRESSION: Acute right femoral neck fracture. Electronically Signed   By: Earle Gell M.D.   On: 04/15/2015 19:01    Medications:  Scheduled: . acetaminophen  650 mg Oral TID  . atorvastatin  40 mg Oral QHS  . fluticasone  2 spray Each Nare Daily  . folic acid  1 mg Oral BID WC  . ipratropium  2 spray Each  Nare BID  . levothyroxine  75 mcg Oral QAC breakfast  . loratadine  10 mg Oral Q breakfast  . metoprolol tartrate  25 mg Oral BID WC  . multivitamin with minerals  1 tablet Oral Daily  . sodium chloride  3 mL Intravenous Q12H  . spironolactone  25 mg Oral Daily   Continuous: . sodium chloride 1,000 mL (04/16/15 0600)   NLG:[id number], magnesium citrate, morphine injection, ondansetron **OR** ondansetron (ZOFRAN) IV, senna-docusate  Assessment/Plan:  Principal Problem:   Closed right hip fracture (HCC) Active Problems:   Chronic diastolic heart failure (HCC)   Hyperlipidemia   HTN (hypertension)   Hypothyroidism   Coronary artery disease  Aortic valvular stenosis, moderate to severe   Long term current use of anticoagulant therapy   PAF (paroxysmal atrial fibrillation) (HCC)    Closed right hip fracture  Await orthopedic input. Pain control. Patient will need operative management.   Preoperative evaluation EKG reviewed which revealed Afib. No ischemic changes. Patient has known history of same. Noted hospitalization in September for Afib with RVR and Diastolic CHF. ECHO showed normal systolic function. Moderate AS was noted. Patient is very active at home and walks almost 45 mins daily without chest pain or shortness of breath. According to the [facility] Perioperative Cardiac risk calculator the estimated probability of perioperative MI or cardiac arrest is 0.98%. Warfarin is on hold. Continue metoprolol and spironolactone. She appears euvolemic. Monitor volume status and heart rate closely in perioperative period. She may proceed to surgery without further cardiac testing.  Chronic diastolic heart failure Currently well compensated and euvolemic. Continue spironolactone. Monitor volume status closely in the perioperative period.  Coronary artery disease Status post bare metal stent to the RCA in 2010. Currently stable. EKG does not show any ischemic changes.  Patient denies any chest pain or shortness of breath.  Aortic valvular stenosis, moderate  Appears to be asymptomatic. Continue to monitor.   PAF (paroxysmal atrial fibrillation) Continue metoprolol by mouth twice a day for rate control. Monitor on telemetry. Hold anticoagulation for surgery.  Hyperlipidemia Continue atorvastatin. Monitor LFTs periodically.  Essential HTN (hypertension) Continue metoprolol and spironolactone. Monitor blood pressure closely  Hypothyroidism Continue levothyroxine.   Leukocytosis Most likely due to acute stress. No evidence for infection. Continue to monitor. She is afebrile.  DVT Prophylaxis: SCDs only for now. Was on warfarin at home    Code Status: Full code  Family Communication: Discussed with the patient  Disposition Plan: Await orthopedic input and surgery.    LOS: 1 day   [facility] Pager [phone] 04/16/2015, 8:27 AM  If 7PM-7AM, please contact night-coverage at [email or web address], password [facility]

## 2015-04-16 NOTE — Consult Note (Addendum)
Reason for Consult:fall with right Hip femoral neck fracture Referring Physician: Maryland Pink MD  [name] is an 81 y.o. female.  HPI: 81yo female with Hx CAD, stent, CHF, Hx falls with shoulder Hemiarthroplasty , pt had a mechanical fall with right femoral neck fx displaced. Chronic coumadin . INR 1.78 this AM  Past Medical History  Diagnosis Date  . Chronic diastolic CHF (congestive heart failure) ([place])     a. 10/2012 Echo: EF 55-60%, no rwma, Gr 2 DD, moderate AS, mod dil LA, mildly to mod dil RA.  Marland Kitchen Hypothyroidism   . Dyslipidemia   . Hypertension   . PAF (paroxysmal atrial fibrillation) ([place])     a. CHA2DS2VASc = 7-->chronic coumadin;  b. s/p DCCV 05/2011, 05/2012, 07/2014;  c. On amio.  Marland Kitchen  Acute respiratiory failure requiring BiPap 06/24/2011  . Aortic valvular stenosis, moderate to severe 06/24/2011    a. 10/2012 Echo: mod AS, Valve 1.25 cm^2 (VTI), 1.19cm^2 (Vmax).  . Allergy   . Coronary artery disease     a. 03/2009 PCI RCA (3.0x18 BMS).  . Exertional shortness of breath   . Arthritis     "back" (10/30/2012)  . Rash     a. felt to be 2/2 lasix/torsemide in setting of sulfa allergy (lasix d/c ~ 06/2014, torsemide d/c 08/09/2014).    Past Surgical History  Procedure Laterality Date  . Cardioversion  06/19/2011    Procedure: CARDIOVERSION;  Surgeon: Sanda Klein, MD;  Location: [place];  Service: Cardiovascular;  Laterality: N/A;  . Appendectomy    . Angioplasty    . Cataract extraction w/ intraocular lens  implant, bilateral    . Lumbar disc surgery  X 2    "cleaned out scar tissue"   . Coronary angioplasty with stent placement      "1" (10/30/2012)  . Dilation and curettage of uterus    . Excisional hemorrhoidectomy    . Wrist fracture surgery Left   . Shoulder acromioplasty Left     "fell; put a new ball in" (10/30/2012)  . Cardioversion  07/11/2010    Successful coversion to sinus rhythm  . Cardiac catheterization  10/06/2009    Continue medical therapy  . Cardiac  catheterization  04/07/2009    RCA stented with a 3x12mm stent resulting in a reduction of 90% narrowing to normal  . Cardioversion N/A 08/10/2014    Procedure: CARDIOVERSION;  Surgeon: Sanda Klein, MD;  Location: [facility];  Service: Cardiovascular;  Laterality: N/A;  . Back surgery    . Fracture surgery      Family History  Problem Relation Age of Onset  . Heart disease Brother   . Heart disease Brother   . Lung disease Brother   . Heart disease Mother 58  . Cancer Mother 84    Uterine  . Stroke Mother 44  . Heart disease Father   . Hypertension Child 34  . Alcohol abuse Child   . Heart attack Father   . Heart failure Mother   . Cancer Son   . Hypertension Mother   . Hypertension Father   . Hypertension Sister   . Hypertension Brother     Social History:  reports that she has never smoked. She has never used smokeless tobacco. She reports that she does not drink alcohol or use illicit drugs.  Allergies:  Allergies  Allergen Reactions  . [name] [Ethacrynic Acid] Nausea And Vomiting  . Tape Other (See Comments)    Tears skin off.  Please  use "paper" tape only.  . Furosemide Rash  . Sulfa Antibiotics Itching and Rash  . [name] Rash    Per Dr Sallyanne Kuster, had a skin reaction with thiazides prior to use of Lasix  . Torsemide Rash    Medications: I have reviewed the patient's current medications.  Results for orders placed or performed during the hospital encounter of 04/15/15 (from the past 48 hour(s))  Type and screen [place]     Status: None   Collection Time: 04/15/15  5:44 PM  Result Value Ref Range   ABO/RH(D) O POS    Antibody Screen NEG    Sample Expiration 94/70/9628   Basic metabolic panel     Status: Abnormal   Collection Time: 04/15/15  5:52 PM  Result Value Ref Range   Sodium 139 135 - 145 mmol/L   Potassium 4.9 3.5 - 5.1 mmol/L   Chloride 108 101 - 111 mmol/L   CO2 24 22 - 32 mmol/L   Glucose, Bld 123 (H)  65 - 99 mg/dL   BUN 17 6 - 20 mg/dL   Creatinine, Ser 0.87 0.44 - 1.00 mg/dL   Calcium 9.0 8.9 - 10.3 mg/dL   GFR calc non Af Amer 57 (L) >60 mL/min   GFR calc Af Amer >60 >60 mL/min    Comment: (NOTE) The eGFR has been calculated using the CKD EPI equation. This calculation has not been validated in all clinical situations. eGFR's persistently <60 mL/min signify possible Chronic Kidney Disease.    Anion gap 7 5 - 15  CBC WITH DIFFERENTIAL     Status: Abnormal   Collection Time: 04/15/15  5:52 PM  Result Value Ref Range   WBC 12.7 (H) 4.0 - 10.5 K/uL   RBC 3.93 3.87 - 5.11 MIL/uL   Hemoglobin 11.8 (L) 12.0 - 15.0 g/dL   HCT 37.7 36.0 - 46.0 %   MCV 95.9 78.0 - 100.0 fL   MCH 30.0 26.0 - 34.0 pg   MCHC 31.3 30.0 - 36.0 g/dL   RDW 13.3 11.5 - 15.5 %   Platelets 212 150 - 400 K/uL   Neutrophils Relative % 72 %   Lymphocytes Relative 22 %   Monocytes Relative 6 %   Eosinophils Relative 0 %   Basophils Relative 0 %   Neutro Abs 9.1 (H) 1.7 - 7.7 K/uL   Lymphs Abs 2.8 0.7 - 4.0 K/uL   Monocytes Absolute 0.8 0.1 - 1.0 K/uL   Eosinophils Absolute 0.0 0.0 - 0.7 K/uL   Basophils Absolute 0.0 0.0 - 0.1 K/uL   RBC Morphology ELLIPTOCYTES    WBC Morphology ATYPICAL LYMPHOCYTES   Protime-INR     Status: Abnormal   Collection Time: 04/15/15  5:52 PM  Result Value Ref Range   Prothrombin Time 21.1 (H) 11.6 - 15.2 seconds   INR 1.83 (H) 0.00 - 1.49  Magnesium     Status: None   Collection Time: 04/15/15  8:53 PM  Result Value Ref Range   Magnesium 2.2 1.7 - 2.4 mg/dL  Phosphorus     Status: None   Collection Time: 04/15/15  8:53 PM  Result Value Ref Range   Phosphorus 3.7 2.5 - 4.6 mg/dL  CBC WITH DIFFERENTIAL     Status: Abnormal   Collection Time: 04/15/15 10:13 PM  Result Value Ref Range   WBC 16.8 (H) 4.0 - 10.5 K/uL   RBC 4.14 3.87 - 5.11 MIL/uL   Hemoglobin 12.5 12.0 - 15.0 g/dL  HCT 39.2 36.0 - 46.0 %   MCV 94.7 78.0 - 100.0 fL   MCH 30.2 26.0 - 34.0 pg   MCHC 31.9  30.0 - 36.0 g/dL   RDW 13.5 11.5 - 15.5 %   Platelets 232 150 - 400 K/uL   Neutrophils Relative % 87 %   Lymphocytes Relative 8 %   Monocytes Relative 4 %   Eosinophils Relative 0 %   Basophils Relative 1 %   Neutro Abs 14.6 (H) 1.7 - 7.7 K/uL   Lymphs Abs 1.3 0.7 - 4.0 K/uL   Monocytes Absolute 0.7 0.1 - 1.0 K/uL   Eosinophils Absolute 0.0 0.0 - 0.7 K/uL   Basophils Absolute 0.2 (H) 0.0 - 0.1 K/uL   RBC Morphology ELLIPTOCYTES    WBC Morphology ATYPICAL LYMPHOCYTES   Surgical pcr screen     Status: None   Collection Time: 04/16/15 12:33 AM  Result Value Ref Range   MRSA, PCR NEGATIVE NEGATIVE   Staphylococcus aureus NEGATIVE NEGATIVE    Comment:        The Xpert SA Assay (FDA approved for NASAL specimens in patients over 85 years of age), is one component of a comprehensive surveillance program.  Test performance has been validated by [facility] for patients greater than or equal to 33 year old. It is not intended to diagnose infection nor to guide or monitor treatment.   Protime-INR     Status: Abnormal   Collection Time: 04/16/15  2:00 AM  Result Value Ref Range   Prothrombin Time 20.7 (H) 11.6 - 15.2 seconds   INR 1.78 (H) 0.00 - 1.49  Comprehensive metabolic panel     Status: Abnormal   Collection Time: 04/16/15  2:00 AM  Result Value Ref Range   Sodium 137 135 - 145 mmol/L   Potassium 4.3 3.5 - 5.1 mmol/L   Chloride 105 101 - 111 mmol/L   CO2 25 22 - 32 mmol/L   Glucose, Bld 123 (H) 65 - 99 mg/dL   BUN 16 6 - 20 mg/dL   Creatinine, Ser 0.85 0.44 - 1.00 mg/dL   Calcium 8.5 (L) 8.9 - 10.3 mg/dL   Total Protein 6.5 6.5 - 8.1 g/dL   Albumin 3.3 (L) 3.5 - 5.0 g/dL   AST 21 15 - 41 U/L   ALT 16 14 - 54 U/L   Alkaline Phosphatase 72 38 - 126 U/L   Total Bilirubin 0.9 0.3 - 1.2 mg/dL   GFR calc non Af Amer 59 (L) >60 mL/min   GFR calc Af Amer >60 >60 mL/min    Comment: (NOTE) The eGFR has been calculated using the CKD EPI equation. This calculation has not  been validated in all clinical situations. eGFR's persistently <60 mL/min signify possible Chronic Kidney Disease.    Anion gap 7 5 - 15    Dg Chest 1 View  04/15/2015  CLINICAL DATA:  Patient with hip pain status post fall. No chest pain. EXAM: CHEST 1 VIEW COMPARISON:  Chest radiograph 02/07/2015 FINDINGS: Stable cardiomegaly. Unchanged bilateral coarse interstitial pulmonary opacities. Stable biapical pleural parenchymal thickening. No large area of pulmonary consolidation. Stable blunting of the costophrenic angles bilaterally. Left shoulder arthroplasty. Mid thoracic spine degenerative changes. IMPRESSION: No acute cardiopulmonary process. Stable coarse interstitial opacities. Electronically Signed   By: Lovey Newcomer M.D.   On: 04/15/2015 19:03   Dg Hip Unilat With Pelvis 2-3 Views Right  04/15/2015  CLINICAL DATA:  Lateral right hip pain status post fall. EXAM:  DG HIP (WITH OR WITHOUT PELVIS) 2-3V RIGHT COMPARISON:  None. FINDINGS: There is generalized osteopenia. There is a mildly displaced right femoral neck fracture. There is no dislocation. There is no other fracture or dislocation. There is peripheral vascular atherosclerotic disease. IMPRESSION: Mildly displaced right femoral neck fracture. Electronically Signed   By: Kathreen Devoid   On: 04/15/2015 19:01   Dg Femur 1v Right  04/15/2015  CLINICAL DATA:  Fall in garage today. Right hip pain. Initial encounter. EXAM: RIGHT FEMUR 1 VIEW COMPARISON:  None. FINDINGS: Mildly displaced right femoral neck fracture is seen. No evidence hip dislocation. No evidence of distal femur fracture. Generalized osteopenia noted. Old fracture deformity of right inferior pubic ramus noted. Peripheral vascular calcification also demonstrated. IMPRESSION: Acute right femoral neck fracture. Electronically Signed   By: Earle Gell M.D.   On: 04/15/2015 19:01    Review of Systems  Constitutional: Positive for fever. Negative for chills.  HENT: Negative for ear  discharge and ear pain.   Eyes: Negative for discharge.  Respiratory: Negative for cough.   Cardiovascular: Negative for PND.       Positive for HTN, CAD, Stent, Hx of palpitations   Gastrointestinal: Negative for heartburn and abdominal pain.  Musculoskeletal: Positive for back pain and falls. Negative for myalgias.       Shoulder hemi from fall and fx  Skin: Negative for rash.  Endo/Heme/Allergies: Does not bruise/bleed easily.  Psychiatric/Behavioral: Negative for depression, suicidal ideas and hallucinations.   Blood pressure 123/79, pulse 90, temperature 97.6 F (36.4 C), temperature source Oral, resp. rate 16, height $RemoveBe'5\' 3"'[id number]$  (1.6 m), weight 59.42 kg (131 lb), SpO2 100 %. Physical Exam  Constitutional: She is oriented to person, place, and time. She appears well-developed.  HENT:  Head: Normocephalic and atraumatic.  Eyes: EOM are normal. Pupils are equal, round, and reactive to light.  Neck: Normal range of motion.  Cardiovascular: Normal rate and regular rhythm.   Respiratory: Effort normal and breath sounds normal.  GI: Soft. She exhibits no distension. There is no tenderness. There is no rebound.  Musculoskeletal: She exhibits tenderness. She exhibits no edema.  Right LE short. Pulses intact.   Neurological: She is alert and oriented to person, place, and time.  Skin: Skin is warm and dry. No rash noted. No erythema. No pallor.  Psychiatric: She has a normal mood and affect. Her behavior is normal. Judgment and thought content normal.    Assessment/Plan: Right displaced femoral neck fracture for Hemiarthroplasty tomorrow . Clear liquid breakfast then NPO . Should be able to [facility] after surgery. Discussed with husband and daughter in law. Will restart coumadin post op  Xaniyah Buchholz C 04/16/2015, 3:09 PM

## 2015-04-16 NOTE — Progress Notes (Signed)
Full note to follow.  Patient seen and examined. EKG reviewed which revealed Afib. No ischemic changes. Patient has known history of same.  Noted hospitalization in September for Afib with RVR and Diastolic CHF. ECHO showed normal systolic function. Moderate AS was noted.   Patient is very active at home and walks almost 45 mins daily without chest pain or shortness of breath.  According to the [facility] Perioperative Cardiac risk calculator the estimated probability of perioperative MI or cardiac arrest is 0.98%.  Warfarin is on hold. Continue metoprolol and spironolactone. She appears euvolemic. Monitor volume status and heart rate closely in perioperative period.  She may proceed to surgery without further cardiac testing.  [name] 04/16/2015 8:59 AM

## 2015-04-17 ENCOUNTER — Inpatient Hospital Stay (HOSPITAL_COMMUNITY): Payer: Medicare Other | Admitting: Anesthesiology

## 2015-04-17 ENCOUNTER — Encounter (HOSPITAL_COMMUNITY): Payer: Self-pay | Admitting: Anesthesiology

## 2015-04-17 ENCOUNTER — Encounter (HOSPITAL_COMMUNITY)
Admission: EM | Disposition: A | Discharge status: Skilled Nursing Facility | Payer: Self-pay | Source: Home / Self Care | Attending: Internal Medicine

## 2015-04-17 DIAGNOSIS — I480 Paroxysmal atrial fibrillation: Secondary | ICD-10-CM

## 2015-04-17 HISTORY — PX: HIP ARTHROPLASTY: SHX981

## 2015-04-17 LAB — BASIC METABOLIC PANEL WITH GFR
Anion gap: 7 (ref 5–15)
BUN: 15 mg/dL (ref 6–20)
CO2: 26 mmol/L (ref 22–32)
Calcium: 8.3 mg/dL — ABNORMAL LOW (ref 8.9–10.3)
Chloride: 106 mmol/L (ref 101–111)
Creatinine, Ser: 0.87 mg/dL (ref 0.44–1.00)
GFR calc Af Amer: >60 mL/min
GFR calc non Af Amer: 57 mL/min — ABNORMAL LOW
Glucose, Bld: 103 mg/dL — ABNORMAL HIGH (ref 65–99)
Potassium: 3.7 mmol/L (ref 3.5–5.1)
Sodium: 139 mmol/L (ref 135–145)

## 2015-04-17 LAB — CBC
HEMATOCRIT: 34.4 % — AB (ref 36.0–46.0)
Hemoglobin: 11.1 g/dL — ABNORMAL LOW (ref 12.0–15.0)
MCH: 31.1 pg (ref 26.0–34.0)
MCHC: 32.3 g/dL (ref 30.0–36.0)
MCV: 96.4 fL (ref 78.0–100.0)
PLATELETS: 173 10*3/uL (ref 150–400)
RBC: 3.57 MIL/uL — AB (ref 3.87–5.11)
RDW: 13.4 % (ref 11.5–15.5)
WBC: 11.5 10*3/uL — AB (ref 4.0–10.5)

## 2015-04-17 LAB — PROTIME-INR
INR: 1.63 — ABNORMAL HIGH (ref 0.00–1.49)
Prothrombin Time: 19.4 seconds — ABNORMAL HIGH (ref 11.6–15.2)

## 2015-04-17 LAB — URINE CULTURE: Culture: NO GROWTH

## 2015-04-17 LAB — PATHOLOGIST SMEAR REVIEW

## 2015-04-17 SURGERY — HEMIARTHROPLASTY, HIP, DIRECT ANTERIOR APPROACH, FOR FRACTURE
Anesthesia: General | Site: Hip | Laterality: Right

## 2015-04-17 MED ORDER — METOCLOPRAMIDE HCL 5 MG PO TABS: 5.0000 mg | ORAL_TABLET | Freq: Three times a day (TID) | ORAL | Status: DC | PRN

## 2015-04-17 MED ORDER — BUPIVACAINE HCL (PF) 0.25 % IJ SOLN
INTRAMUSCULAR | Status: DC | PRN
  Administered 2015-04-17: 10 mL

## 2015-04-17 MED ORDER — CEFAZOLIN SODIUM 1-5 GM-% IV SOLN
1.0000 g | Freq: Three times a day (TID) | INTRAVENOUS | Status: AC
  Administered 2015-04-17 – 2015-04-18 (×2): 1 g via INTRAVENOUS
  Filled 2015-04-17 (×2): qty 50

## 2015-04-17 MED ORDER — WARFARIN - PHARMACIST DOSING INPATIENT
Freq: Every day | Status: DC
  Administered 2015-04-17: 19:00:00

## 2015-04-17 MED ORDER — METOPROLOL TARTRATE 1 MG/ML IV SOLN
2.5000 mg | Freq: Four times a day (QID) | INTRAVENOUS | Status: DC | PRN
  Filled 2015-04-17: qty 5

## 2015-04-17 MED ORDER — ONDANSETRON HCL 4 MG PO TABS: 4.0000 mg | ORAL_TABLET | Freq: Four times a day (QID) | ORAL | Status: DC | PRN

## 2015-04-17 MED ORDER — CEFAZOLIN SODIUM-DEXTROSE 2-3 GM-% IV SOLR
INTRAVENOUS | Status: DC | PRN
  Administered 2015-04-17: 2 g via INTRAVENOUS

## 2015-04-17 MED ORDER — HYDROCODONE-ACETAMINOPHEN 7.5-325 MG PO TABS
1.0000 | ORAL_TABLET | Freq: Four times a day (QID) | ORAL | Status: DC | PRN
  Administered 2015-04-17 – 2015-04-19 (×4): 1 via ORAL
  Filled 2015-04-17 (×4): qty 1

## 2015-04-17 MED ORDER — ONDANSETRON HCL 4 MG/2ML IJ SOLN
4.0000 mg | Freq: Four times a day (QID) | INTRAMUSCULAR | Status: DC | PRN
Start: 2015-04-17 — End: 2015-04-19

## 2015-04-17 MED ORDER — METOCLOPRAMIDE HCL 5 MG/ML IJ SOLN: 5.0000 mg | Freq: Three times a day (TID) | INTRAMUSCULAR | Status: DC | PRN

## 2015-04-17 MED ORDER — PROMETHAZINE HCL 25 MG/ML IJ SOLN
6.2500 mg | INTRAMUSCULAR | Status: DC | PRN
Start: 2015-04-17 — End: 2015-04-17

## 2015-04-17 MED ORDER — FENTANYL CITRATE (PF) 250 MCG/5ML IJ SOLN
INTRAMUSCULAR | Status: AC
  Filled 2015-04-17: qty 5

## 2015-04-17 MED ORDER — FENTANYL CITRATE (PF) 100 MCG/2ML IJ SOLN: 25.0000 ug | INTRAMUSCULAR | Status: DC | PRN

## 2015-04-17 MED ORDER — BUPIVACAINE HCL (PF) 0.25 % IJ SOLN
INTRAMUSCULAR | Status: AC
  Filled 2015-04-17: qty 30

## 2015-04-17 MED ORDER — SODIUM CHLORIDE 0.9 % IR SOLN
Status: DC | PRN
  Administered 2015-04-17: 1000 mL

## 2015-04-17 MED ORDER — MENTHOL 3 MG MT LOZG: 1.0000 | LOZENGE | OROMUCOSAL | Status: DC | PRN

## 2015-04-17 MED ORDER — NEOSTIGMINE METHYLSULFATE 10 MG/10ML IV SOLN
INTRAVENOUS | Status: DC | PRN
  Administered 2015-04-17: 4 mg via INTRAVENOUS

## 2015-04-17 MED ORDER — WARFARIN SODIUM 2 MG PO TABS
2.0000 mg | ORAL_TABLET | Freq: Once | ORAL | Status: AC
  Administered 2015-04-17: 2 mg via ORAL
  Filled 2015-04-17: qty 1

## 2015-04-17 MED ORDER — ENSURE ENLIVE PO LIQD
237.0000 mL | Freq: Every day | ORAL | Status: DC
  Administered 2015-04-18 – 2015-04-19 (×2): 237 mL via ORAL

## 2015-04-17 MED ORDER — LACTATED RINGERS IV SOLN
INTRAVENOUS | Status: DC
  Administered 2015-04-17 (×2): via INTRAVENOUS

## 2015-04-17 MED ORDER — FENTANYL CITRATE (PF) 100 MCG/2ML IJ SOLN
INTRAMUSCULAR | Status: DC | PRN
  Administered 2015-04-17: 50 ug via INTRAVENOUS

## 2015-04-17 MED ORDER — PHENOL 1.4 % MT LIQD: 1.0000 | OROMUCOSAL | Status: DC | PRN

## 2015-04-17 MED ORDER — MEPERIDINE HCL 25 MG/ML IJ SOLN: 6.2500 mg | INTRAMUSCULAR | Status: DC | PRN

## 2015-04-17 MED ORDER — ACETAMINOPHEN 650 MG RE SUPP: 650.0000 mg | Freq: Four times a day (QID) | RECTAL | Status: DC | PRN

## 2015-04-17 MED ORDER — LACTATED RINGERS IV SOLN: INTRAVENOUS | Status: DC

## 2015-04-17 MED ORDER — PROPOFOL 10 MG/ML IV BOLUS
INTRAVENOUS | Status: DC | PRN
  Administered 2015-04-17: 100 mg via INTRAVENOUS

## 2015-04-17 MED ORDER — LIDOCAINE HCL (CARDIAC) 20 MG/ML IV SOLN
INTRAVENOUS | Status: DC | PRN
  Administered 2015-04-17: 70 mg via INTRAVENOUS

## 2015-04-17 MED ORDER — PROPOFOL 10 MG/ML IV BOLUS
INTRAVENOUS | Status: AC
  Filled 2015-04-17: qty 20

## 2015-04-17 MED ORDER — GLYCOPYRROLATE 0.2 MG/ML IJ SOLN
INTRAMUSCULAR | Status: DC | PRN
  Administered 2015-04-17: 0.6 mg via INTRAVENOUS

## 2015-04-17 MED ORDER — DOCUSATE SODIUM 100 MG PO CAPS
100.0000 mg | ORAL_CAPSULE | Freq: Two times a day (BID) | ORAL | Status: DC
  Administered 2015-04-17 – 2015-04-19 (×4): 100 mg via ORAL
  Filled 2015-04-17 (×4): qty 1

## 2015-04-17 MED ORDER — ROCURONIUM BROMIDE 100 MG/10ML IV SOLN
INTRAVENOUS | Status: DC | PRN
  Administered 2015-04-17: 40 mg via INTRAVENOUS

## 2015-04-17 MED ORDER — MORPHINE SULFATE (PF) 2 MG/ML IV SOLN: 1.0000 mg | INTRAVENOUS | Status: DC | PRN

## 2015-04-17 MED ORDER — PHENYLEPHRINE HCL 10 MG/ML IJ SOLN
INTRAMUSCULAR | Status: DC | PRN
  Administered 2015-04-17 (×2): 80 ug via INTRAVENOUS

## 2015-04-17 MED ORDER — ACETAMINOPHEN 325 MG PO TABS: 650.0000 mg | ORAL_TABLET | Freq: Four times a day (QID) | ORAL | Status: DC | PRN

## 2015-04-17 SURGICAL SUPPLY — 52 items
BENZOIN TINCTURE PRP APPL 2/3 (GAUZE/BANDAGES/DRESSINGS) ×3 IMPLANT
BLADE SAW SAG 73X25 THK (BLADE) ×2
BLADE SAW SGTL 73X25 THK (BLADE) ×1 IMPLANT
CAPT HIP HEMI 1 ×3 IMPLANT
CLOSURE WOUND 1/2 X4 (GAUZE/BANDAGES/DRESSINGS) ×2
DRAPE IMP U-DRAPE 54X76 (DRAPES) ×3 IMPLANT
DRAPE INCISE IOBAN 66X45 STRL (DRAPES) IMPLANT
DRAPE ORTHO SPLIT 77X108 STRL (DRAPES) ×4
DRAPE SURG ORHT 6 SPLT 77X108 (DRAPES) ×2 IMPLANT
DRAPE U-SHAPE 47X51 STRL (DRAPES) ×3 IMPLANT
DRESSING AQUACEL AQ EXTRA 4X5 (GAUZE/BANDAGES/DRESSINGS) ×3 IMPLANT
DRSG MEPILEX BORDER 4X8 (GAUZE/BANDAGES/DRESSINGS) ×3 IMPLANT
DRSG PAD ABDOMINAL 8X10 ST (GAUZE/BANDAGES/DRESSINGS) ×6 IMPLANT
DURAPREP 26ML APPLICATOR (WOUND CARE) ×3 IMPLANT
ELECT CAUTERY BLADE 6.4 (BLADE) ×3 IMPLANT
ELECT REM PT RETURN 9FT ADLT (ELECTROSURGICAL) ×3
ELECTRODE REM PT RTRN 9FT ADLT (ELECTROSURGICAL) ×1 IMPLANT
FACESHIELD WRAPAROUND (MASK) ×6 IMPLANT
GAUZE SPONGE 4X4 12PLY STRL (GAUZE/BANDAGES/DRESSINGS) ×3 IMPLANT
GAUZE XEROFORM 5X9 LF (GAUZE/BANDAGES/DRESSINGS) ×3 IMPLANT
GLOVE BIOGEL PI IND STRL 8 (GLOVE) ×2 IMPLANT
GLOVE BIOGEL PI INDICATOR 8 (GLOVE) ×4
GLOVE ORTHO TXT STRL SZ7.5 (GLOVE) ×6 IMPLANT
GOWN STRL REUS W/ TWL LRG LVL3 (GOWN DISPOSABLE) ×1 IMPLANT
GOWN STRL REUS W/ TWL XL LVL3 (GOWN DISPOSABLE) ×1 IMPLANT
GOWN STRL REUS W/TWL 2XL LVL3 (GOWN DISPOSABLE) ×3 IMPLANT
GOWN STRL REUS W/TWL LRG LVL3 (GOWN DISPOSABLE) ×2
GOWN STRL REUS W/TWL XL LVL3 (GOWN DISPOSABLE) ×2
IMMOBILIZER KNEE 22 (SOFTGOODS) ×3 IMPLANT
IMMOBILIZER KNEE 22 UNIV (SOFTGOODS) IMPLANT
KIT BASIN OR (CUSTOM PROCEDURE TRAY) ×3 IMPLANT
KIT ROOM TURNOVER OR (KITS) ×3 IMPLANT
MANIFOLD NEPTUNE II (INSTRUMENTS) ×3 IMPLANT
NEEDLE HYPO 25X1 1.5 SAFETY (NEEDLE) ×3 IMPLANT
NS IRRIG 1000ML POUR BTL (IV SOLUTION) ×3 IMPLANT
PACK TOTAL JOINT (CUSTOM PROCEDURE TRAY) ×3 IMPLANT
PACK UNIVERSAL I (CUSTOM PROCEDURE TRAY) ×3 IMPLANT
PAD ARMBOARD 7.5X6 YLW CONV (MISCELLANEOUS) ×6 IMPLANT
PIN STEINMAN 3/16 (PIN) IMPLANT
SPONGE LAP 4X18 X RAY DECT (DISPOSABLE) IMPLANT
STAPLER VISISTAT 35W (STAPLE) IMPLANT
STRIP CLOSURE SKIN 1/2X4 (GAUZE/BANDAGES/DRESSINGS) ×4 IMPLANT
SUT ETHIBOND NAB CT1 #1 30IN (SUTURE) ×9 IMPLANT
SUT TICRON (SUTURE) ×6 IMPLANT
SUT VIC AB 2-0 CT1 27 (SUTURE) ×6
SUT VIC AB 2-0 CT1 TAPERPNT 27 (SUTURE) ×3 IMPLANT
SUT VICRYL 0 TIES 12 18 (SUTURE) ×3 IMPLANT
SYR CONTROL 10ML LL (SYRINGE) ×6 IMPLANT
TOWEL OR 17X24 6PK STRL BLUE (TOWEL DISPOSABLE) ×3 IMPLANT
TOWEL OR 17X26 10 PK STRL BLUE (TOWEL DISPOSABLE) ×3 IMPLANT
TRAY FOLEY CATH 16FRSI W/METER (SET/KITS/TRAYS/PACK) IMPLANT
WATER STERILE IRR 1000ML POUR (IV SOLUTION) ×12 IMPLANT

## 2015-04-17 NOTE — Transfer of Care (Signed)
Immediate Anesthesia Transfer of Care Note  Patient: [name]  Procedure(s) Performed: Procedure(s): HIP MONOPOLAR HIP HEMI ARTHROPLASTY (Right)  Patient Location: PACU  Anesthesia Type:General  Level of Consciousness: awake, alert , oriented and patient cooperative  Airway & Oxygen Therapy: Patient Spontanous Breathing and Patient connected to nasal cannula oxygen  Post-op Assessment: Report given to RN, Post -op Vital signs reviewed and stable and Patient moving all extremities  Post vital signs: Reviewed and stable  Last Vitals:  Filed Vitals:   04/17/15 0447 04/17/15 0900  BP: 125/71 120/57  Pulse: 107 87  Temp: 36.6 C 36.7 C  Resp: 16 16    Complications: No apparent anesthesia complications

## 2015-04-17 NOTE — Anesthesia Procedure Notes (Signed)
Procedure Name: Intubation Date/Time: 04/17/2015 2:59 PM Performed by: Gaylene Brooks Pre-anesthesia Checklist: Patient identified, Emergency Drugs available, Suction available, Patient being monitored and Timeout performed Patient Re-evaluated:Patient Re-evaluated prior to inductionOxygen Delivery Method: Circle system utilized Preoxygenation: Pre-oxygenation with 100% oxygen Intubation Type: IV induction Ventilation: Mask ventilation without difficulty Laryngoscope Size: Miller and 2 Grade View: Grade II Tube type: Oral Tube size: 7.0 mm Number of attempts: 1 Airway Equipment and Method: Stylet Placement Confirmation: ETT inserted through vocal cords under direct vision,  positive ETCO2,  CO2 detector and breath sounds checked- equal and bilateral Secured at: 21 cm Tube secured with: Tape Dental Injury: Teeth and Oropharynx as per pre-operative assessment

## 2015-04-17 NOTE — Brief Op Note (Addendum)
04/15/2015 - 04/17/2015  4:06 PM  PATIENT:  [name]  81 y.o. female  PRE-OPERATIVE DIAGNOSIS:  right femur neck fx  POST-OPERATIVE DIAGNOSIS:  right femur neck fx  PROCEDURE:  Procedure(s): HIP MONOPOLAR HIP HEMI ARTHROPLASTY (Right)  SURGEON:  Surgeon(s) and Role:    * Marybelle Killings, MD - Primary  PHYSICIAN ASSISTANT:     ANESTHESIA:   general  EBL:  Total I/O In: -  Out: 250 [Urine:250]  BLOOD ADMINISTERED:none  DRAINS: none   LOCAL MEDICATIONS USED:  NONE  SPECIMEN:  No Specimen  DISPOSITION OF SPECIMEN:  N/A  COUNTS:  YES  TOURNIQUET:  * No tourniquets in log *  DICTATION: .Dragon Dictation  PLAN OF CARE: Admit to inpatient   PATIENT DISPOSITION:  PACU - hemodynamically stable.

## 2015-04-17 NOTE — Progress Notes (Signed)
[facility] PROGRESS NOTE  KYNZLEE HUCKER PYK:[id number] DOB: 02-12-1924 DOA: 04/15/2015  PCP: Wyatt Haste, MD  Brief HPI: 81 year old Caucasian female with a past medical history of chronic diastolic CHF, hypothyroidism, hyperlipidemia, paroxysmal atrial fibrillation, coronary artery disease, presented after a mechanical fall at home resulting in fracture of the right hip. She was hospitalized for further management.  Past medical history:  Past Medical History  Diagnosis Date  . Chronic diastolic CHF (congestive heart failure) ([place])     a. 10/2012 Echo: EF 55-60%, no rwma, Gr 2 DD, moderate AS, mod dil LA, mildly to mod dil RA.  Marland Kitchen Hypothyroidism   . Dyslipidemia   . Hypertension   . PAF (paroxysmal atrial fibrillation) ([place])     a. CHA2DS2VASc = 7-->chronic coumadin;  b. s/p DCCV 05/2011, 05/2012, 07/2014;  c. On amio.  Marland Kitchen  Acute respiratiory failure requiring BiPap 06/24/2011  . Aortic valvular stenosis, moderate to severe 06/24/2011    a. 10/2012 Echo: mod AS, Valve 1.25 cm^2 (VTI), 1.19cm^2 (Vmax).  . Allergy   . Coronary artery disease     a. 03/2009 PCI RCA (3.0x18 BMS).  . Exertional shortness of breath   . Arthritis     "back" (10/30/2012)  . Rash     a. felt to be 2/2 lasix/torsemide in setting of sulfa allergy (lasix d/c ~ 06/2014, torsemide d/c 08/09/2014).    Consultants: Orthopedics  Procedures: Await surgery today  Antibiotics: None  Subjective: Patient remains stable. Denies any hip pain currently. Denies any chest pain, shortness of breath, nausea or vomiting. She states that she is very active at home. Walks for at least 45 minutes every day.  Objective: Vital Signs  Filed Vitals:   04/16/15 0450 04/16/15 1900 04/16/15 1926 04/17/15 0447  BP: 123/79 123/64 122/76 125/71  Pulse: 90 83 88 107  Temp: 97.6 F (36.4 C) 98.8 F (37.1 C) 98.3 F (36.8 C) 97.8 F (36.6 C)  TempSrc: Oral Oral Oral Oral  Resp: '16 17 16 16  '$ Height:      Weight:       SpO2: 100% 98% 99% 100%    Intake/Output Summary (Last 24 hours) at 04/17/15 0811 Last data filed at 04/17/15 0735  Gross per 24 hour  Intake    780 ml  Output   1800 ml  Net  -1020 ml   Filed Weights   04/15/15 1625 04/15/15 2125  Weight: 59.421 kg (131 lb) 59.42 kg (131 lb)    General appearance: alert, cooperative, appears stated age and no distress Resp: clear to auscultation bilaterally Cardio: S1, S2 irregularly irregular. Slightly tachycardic this morning. Systolic murmur appreciated over the aortic area. No S3, S4. No rubs, or bruit. GI: soft, non-tender; bowel sounds normal; no masses,  no organomegaly Extremities: Right lower extremity is externally rotated. Neurologic: Alert and oriented 3. Cranial nerves II-12 intact.  Lab Results:  Basic Metabolic Panel:  Recent Labs Lab 04/15/15 1752 04/15/15 2053 04/16/15 0200 04/17/15 0308  NA 139  --  137 139  K 4.9  --  4.3 3.7  CL 108  --  105 106  CO2 24  --  25 26  GLUCOSE 123*  --  123* 103*  BUN 17  --  16 15  CREATININE 0.87  --  0.85 0.87  CALCIUM 9.0  --  8.5* 8.3*  MG  --  2.2  --   --   PHOS  --  3.7  --   --  Liver Function Tests:  Recent Labs Lab 04/16/15 0200  AST 21  ALT 16  ALKPHOS 72  BILITOT 0.9  PROT 6.5  ALBUMIN 3.3*   CBC:  Recent Labs Lab 04/15/15 1752 04/15/15 2213 04/17/15 0308  WBC 12.7* 16.8* 11.5*  NEUTROABS 9.1* 14.6*  --   HGB 11.8* 12.5 11.1*  HCT 37.7 39.2 34.4*  MCV 95.9 94.7 96.4  PLT 212 232 173   CBG: No results for input(s): GLUCAP in the last 168 hours.  Recent Results (from the past 240 hour(s))  Surgical pcr screen     Status: None   Collection Time: 04/16/15 12:33 AM  Result Value Ref Range Status   MRSA, PCR NEGATIVE NEGATIVE Final   Staphylococcus aureus NEGATIVE NEGATIVE Final    Comment:        The Xpert SA Assay (FDA approved for NASAL specimens in patients over 23 years of age), is one component of a comprehensive  surveillance program.  Test performance has been validated by [facility] for patients greater than or equal to 41 year old. It is not intended to diagnose infection nor to guide or monitor treatment.       Studies/Results: Dg Chest 1 View  04/15/2015  CLINICAL DATA:  Patient with hip pain status post fall. No chest pain. EXAM: CHEST 1 VIEW COMPARISON:  Chest radiograph 02/07/2015 FINDINGS: Stable cardiomegaly. Unchanged bilateral coarse interstitial pulmonary opacities. Stable biapical pleural parenchymal thickening. No large area of pulmonary consolidation. Stable blunting of the costophrenic angles bilaterally. Left shoulder arthroplasty. Mid thoracic spine degenerative changes. IMPRESSION: No acute cardiopulmonary process. Stable coarse interstitial opacities. Electronically Signed   By: Lovey Newcomer M.D.   On: 04/15/2015 19:03   Dg Hip Unilat With Pelvis 2-3 Views Right  04/15/2015  CLINICAL DATA:  Lateral right hip pain status post fall. EXAM: DG HIP (WITH OR WITHOUT PELVIS) 2-3V RIGHT COMPARISON:  None. FINDINGS: There is generalized osteopenia. There is a mildly displaced right femoral neck fracture. There is no dislocation. There is no other fracture or dislocation. There is peripheral vascular atherosclerotic disease. IMPRESSION: Mildly displaced right femoral neck fracture. Electronically Signed   By: Kathreen Devoid   On: 04/15/2015 19:01   Dg Femur 1v Right  04/15/2015  CLINICAL DATA:  Fall in garage today. Right hip pain. Initial encounter. EXAM: RIGHT FEMUR 1 VIEW COMPARISON:  None. FINDINGS: Mildly displaced right femoral neck fracture is seen. No evidence hip dislocation. No evidence of distal femur fracture. Generalized osteopenia noted. Old fracture deformity of right inferior pubic ramus noted. Peripheral vascular calcification also demonstrated. IMPRESSION: Acute right femoral neck fracture. Electronically Signed   By: Earle Gell M.D.   On: 04/15/2015 19:01    Medications:   Scheduled: . acetaminophen  650 mg Oral TID  . atorvastatin  40 mg Oral QHS  . fluticasone  2 spray Each Nare Daily  . folic acid  1 mg Oral BID WC  . ipratropium  2 spray Each Nare BID  . levothyroxine  75 mcg Oral QAC breakfast  . loratadine  10 mg Oral Q breakfast  . metoprolol tartrate  25 mg Oral BID WC  . multivitamin with minerals  1 tablet Oral Daily  . sodium chloride  3 mL Intravenous Q12H  . spironolactone  25 mg Oral Daily   Continuous: . sodium chloride 1,000 mL (04/16/15 0600)   [id number], magnesium citrate, metoprolol, morphine injection, ondansetron **OR** ondansetron (ZOFRAN) IV, senna-docusate  Assessment/Plan:  Principal Problem:   Closed  right hip fracture (HCC) Active Problems:   Chronic diastolic heart failure (HCC)   Hyperlipidemia   HTN (hypertension)   Hypothyroidism   Coronary artery disease   Aortic valvular stenosis, moderate to severe   Long term current use of anticoagulant therapy   PAF (paroxysmal atrial fibrillation) (HCC)    Closed right hip fracture  Await Surgery. Orthopedics has seen the patient. In control.   Preoperative evaluation EKG reviewed which revealed Afib. No ischemic changes. Patient has known history of same. Noted hospitalization in September for Afib with RVR and Diastolic CHF. ECHO showed normal systolic function. Moderate AS was noted. Patient is very active at home and walks almost 45 mins daily without chest pain or shortness of breath. According to the [facility] Perioperative Cardiac risk calculator the estimated probability of perioperative MI or cardiac arrest is 0.98%. Warfarin is on hold. Continue metoprolol and spironolactone. She appears euvolemic. Monitor volume status and heart rate closely in perioperative period. She may proceed to surgery without further cardiac testing.  PAF (paroxysmal atrial fibrillation) Mild RVR this morning. She was given her a.m. Dose of metoprolol with which the  heart rate was better controlled. Continue metoprolol by mouth twice a day for rate control. Monitor on telemetry. Hold anticoagulation for surgery.  Chronic diastolic heart failure Currently well compensated and euvolemic. Continue spironolactone. Monitor volume status closely in the perioperative period.  Coronary artery disease Status post bare metal stent to the RCA in 2010. Currently stable. EKG does not show any ischemic changes. Patient denies any chest pain or shortness of breath.  Aortic valvular stenosis, moderate  Appears to be asymptomatic. Continue to monitor.   Hyperlipidemia Continue atorvastatin. Monitor LFTs periodically.  Essential HTN (hypertension) Continue metoprolol and spironolactone. Monitor blood pressure closely  Hypothyroidism Continue levothyroxine.   Leukocytosis Most likely due to acute stress. Improved today. No evidence for infection. Continue to monitor. She is afebrile.  DVT Prophylaxis: SCDs only for now. Was on warfarin at home    Code Status: Full code  Family Communication: Discussed with the patient  Disposition Plan: Await surgery.    LOS: 2 days   [facility] Pager [phone] 04/17/2015, 8:11 AM  If 7PM-7AM, please contact night-coverage at [email or web address], password [email or web address]

## 2015-04-17 NOTE — Progress Notes (Signed)
ANTICOAGULATION CONSULT NOTE - Initial Consult  Pharmacy Consult for warfarin Indication: atrial fibrillation  Allergies  Allergen Reactions  . [name] [Ethacrynic Acid] Nausea And Vomiting  . Tape Other (See Comments)    Tears skin off.  Please use "paper" tape only.  . Furosemide Rash  . Sulfa Antibiotics Itching and Rash  . [name] Rash    Per Dr Sallyanne Kuster, had a skin reaction with thiazides prior to use of Lasix  . Torsemide Rash    Patient Measurements: Height: '5\' 3"'$  (160 cm) Weight: 131 lb (59.42 kg) IBW/kg (Calculated) : 52.4  Vital Signs: Temp: 98.1 F (36.7 C) (11/21 1715) Temp Source: Oral (11/21 0900) BP: 135/91 mmHg (11/21 1652) Pulse Rate: 91 (11/21 1700)  Labs:  Recent Labs  04/15/15 1752 04/15/15 2213 04/16/15 0200 04/17/15 0308  HGB 11.8* 12.5  --  11.1*  HCT 37.7 39.2  --  34.4*  PLT 212 232  --  173  LABPROT 21.1*  --  20.7* 19.4*  INR 1.83*  --  1.78* 1.63*  CREATININE 0.87  --  0.85 0.87    Estimated Creatinine Clearance: 36.3 mL/min (by C-G formula based on Cr of 0.87).  Assessment: 81 yo female admitted with closed right hip fx  PMH: dHF, Hypothyroid, HLD, HTN, AFib on warfarin, CAD, OA  AC: warfarin PTA for afib. Admit INR 1.83. To re-start post surgery 11/21. INR now 1.63  Home dose '2mg'$  daily  Renal: SCr 0.87  Heme: H&H 11.1/34.4, Plt 173  Goal of Therapy:  INR 2-3 Monitor platelets by anticoagulation protocol: Yes   Plan:  Warfarin 2 mg x 1 (will start low with age and recent surgery) Daily INR, CBC q72h Monitor for s/sx of bleeding  Levester Fresh, PharmD, BCPS Clinical Pharmacist Pager [phone] 04/17/2015 5:42 PM

## 2015-04-17 NOTE — Anesthesia Preprocedure Evaluation (Addendum)
Anesthesia Evaluation  Patient identified by MRN, date of birth, ID band Patient awake    Reviewed: Allergy & Precautions, NPO status , Patient's Chart, lab work & pertinent test results, reviewed documented beta blocker date and time   Airway Mallampati: II  TM Distance: >3 FB Neck ROM: Full    Dental  (+) Teeth Intact   Pulmonary neg pulmonary ROS,    breath sounds clear to auscultation       Cardiovascular hypertension, Pt. on medications and Pt. on home beta blockers + CAD, + Cardiac Stents, + Peripheral Vascular Disease and +CHF  + dysrhythmias Atrial Fibrillation  Rhythm:Irregular Rate:Abnormal     Neuro/Psych negative neurological ROS  negative psych ROS   GI/Hepatic   Endo/Other  Hypothyroidism   Renal/GU      Musculoskeletal  (+) Arthritis ,   Abdominal   Peds  Hematology negative hematology ROS (+)   Anesthesia Other Findings - Moderate to severe aortic stenosis - CAD s/p RCA BMS  Reproductive/Obstetrics negative OB ROS                            Lab Results  Component Value Date   WBC 11.5* 04/17/2015   HGB 11.1* 04/17/2015   HCT 34.4* 04/17/2015   MCV 96.4 04/17/2015   PLT 173 04/17/2015   Lab Results  Component Value Date   CREATININE 0.87 04/17/2015   BUN 15 04/17/2015   NA 139 04/17/2015   K 3.7 04/17/2015   CL 106 04/17/2015   CO2 26 04/17/2015   Lab Results  Component Value Date   INR 1.63* 04/17/2015   INR 1.78* 04/16/2015   INR 1.83* 04/15/2015   04/16/15 EKG: atrial fibrillation.  Echo: - Left ventricle: The cavity size was normal. Wall thickness was normal. Systolic function was normal. The estimated ejection fraction was in the range of 55% to 60%. - Aortic valve: There was moderate stenosis. There was trivial regurgitation. Valve area (VTI): 1.1 cm^2. Valve area (Vmax): 1.05 cm^2. Valve area (Vmean): 1.08 cm^2. - Mitral valve: Calcified  annulus. Mildly thickened leaflets . There was mild regurgitation. Valve area by continuity equation (using LVOT flow): 1.53 cm^2. - Left atrium: The atrium was mildly dilated. - Right atrium: The atrium was mildly dilated. - Atrial septum: No defect or patent foramen ovale was identified.   Anesthesia Physical Anesthesia Plan  ASA: III  Anesthesia Plan: General   Post-op Pain Management:    Induction: Intravenous  Airway Management Planned: Oral ETT  Additional Equipment:   Intra-op Plan:   Post-operative Plan: Extubation in OR  Informed Consent: I have reviewed the patients History and Physical, chart, labs and discussed the procedure including the risks, benefits and alternatives for the proposed anesthesia with the patient or authorized representative who has indicated his/her understanding and acceptance.   Dental advisory given  Plan Discussed with: CRNA  Anesthesia Plan Comments:         Anesthesia Quick Evaluation

## 2015-04-17 NOTE — Progress Notes (Signed)
Initial Nutrition Assessment  DOCUMENTATION CODES:   Not applicable  INTERVENTION:  Once diet advances, provide Ensure Enlive po once daily, each supplement provides 350 kcal and 20 grams of protein.  NUTRITION DIAGNOSIS:   Increased nutrient needs related to  (s/p surgery) as evidenced by estimated needs.  GOAL:   Patient will meet greater than or equal to 90% of their needs  MONITOR:   Diet advancement, Skin, Weight trends, Labs, I & O's  REASON FOR ASSESSMENT:   Consult Assessment of nutrition requirement/status  ASSESSMENT:   81 year old Caucasian female with a past medical history of chronic diastolic CHF, hypothyroidism, hyperlipidemia, paroxysmal atrial fibrillation, coronary artery disease, presented after a mechanical fall at home resulting in fracture of the right hip.   Pt scheduled for surgery today. Pt reports having a good appetite currently and PTA with consumption of 3 meals daily with no other difficulties. Per Epic weight records, pt with gradual weight loss, however not found significant (9% in 6 months). Pt is agreeable to Ensure once diet advances to aid in healing. RD to order.   Pt with no observed significant fat or muscle mass loss.   Labs and medications reviewed.   Diet Order:  Diet clear liquid Room service appropriate?: Yes; Fluid consistency:: Thin  Skin:  Reviewed, no issues  Last BM:  11/18  Height:   Ht Readings from Last 1 Encounters:  04/15/15 '5\' 3"'$  (1.6 m)    Weight:   Wt Readings from Last 1 Encounters:  04/15/15 131 lb (59.42 kg)    Ideal Body Weight:  52.27 kg  BMI:  Body mass index is 23.21 kg/(m^2).  Estimated Nutritional Needs:   Kcal:  1600-1800  Protein:  70-80 grams  Fluid:  1.6 - 1.8 L/day  EDUCATION NEEDS:   No education needs identified at this time  Corrin Parker, MS, RD, LDN Pager # [phone] After hours/ weekend pager # [phone]

## 2015-04-17 NOTE — Progress Notes (Signed)
Utilization review completed.  

## 2015-04-17 NOTE — Anesthesia Postprocedure Evaluation (Signed)
Anesthesia Post Note  Patient: Myer Peer  Procedure(s) Performed: Procedure(s) (LRB): HIP MONOPOLAR HIP HEMI ARTHROPLASTY (Right)  Patient location during evaluation: PACU Anesthesia Type: General Level of consciousness: awake and alert Pain management: pain level controlled Vital Signs Assessment: post-procedure vital signs reviewed and stable Respiratory status: spontaneous breathing, nonlabored ventilation, respiratory function stable and patient connected to nasal cannula oxygen Cardiovascular status: blood pressure returned to baseline and stable Postop Assessment: No signs of nausea or vomiting Anesthetic complications: no    Last Vitals:  Filed Vitals:   04/17/15 1700 04/17/15 1715  BP:    Pulse: 91   Temp:  36.7 C  Resp: 18     Last Pain:  Filed Vitals:   04/17/15 1726  PainSc: 0-No pain        RLE Motor Response: Purposeful movement, Responds to commands RLE Sensation: Pain, Full sensation      Effie Berkshire

## 2015-04-18 ENCOUNTER — Encounter (HOSPITAL_COMMUNITY): Payer: Self-pay | Admitting: Orthopaedic Surgery

## 2015-04-18 DIAGNOSIS — I10 Essential (primary) hypertension: Secondary | ICD-10-CM

## 2015-04-18 LAB — BASIC METABOLIC PANEL
Anion gap: 7 (ref 5–15)
BUN: 16 mg/dL (ref 6–20)
CHLORIDE: 107 mmol/L (ref 101–111)
CO2: 24 mmol/L (ref 22–32)
Calcium: 8.1 mg/dL — ABNORMAL LOW (ref 8.9–10.3)
Creatinine, Ser: 0.93 mg/dL (ref 0.44–1.00)
GFR calc non Af Amer: 53 mL/min — ABNORMAL LOW (ref >60)
Glucose, Bld: 106 mg/dL — ABNORMAL HIGH (ref 65–99)
POTASSIUM: 3.8 mmol/L (ref 3.5–5.1)
SODIUM: 138 mmol/L (ref 135–145)

## 2015-04-18 LAB — CBC
HEMATOCRIT: 30.8 % — AB (ref 36.0–46.0)
HEMOGLOBIN: 10 g/dL — AB (ref 12.0–15.0)
MCH: 31 pg (ref 26.0–34.0)
MCHC: 32.5 g/dL (ref 30.0–36.0)
MCV: 95.4 fL (ref 78.0–100.0)
Platelets: 160 10*3/uL (ref 150–400)
RBC: 3.23 MIL/uL — AB (ref 3.87–5.11)
RDW: 13.3 % (ref 11.5–15.5)
WBC: 11.3 10*3/uL — AB (ref 4.0–10.5)

## 2015-04-18 LAB — PROTIME-INR
INR: 1.44 (ref 0.00–1.49)
Prothrombin Time: 17.6 seconds — ABNORMAL HIGH (ref 11.6–15.2)

## 2015-04-18 MED ORDER — POLYETHYLENE GLYCOL 3350 17 G PO PACK
17.0000 g | PACK | Freq: Every day | ORAL | Status: DC | PRN
  Administered 2015-04-19: 17 g via ORAL
  Filled 2015-04-18: qty 1

## 2015-04-18 MED ORDER — WARFARIN SODIUM 3 MG PO TABS
3.0000 mg | ORAL_TABLET | Freq: Once | ORAL | Status: AC
  Administered 2015-04-18: 3 mg via ORAL
  Filled 2015-04-18: qty 1

## 2015-04-18 MED ORDER — METOPROLOL TARTRATE 50 MG PO TABS
50.0000 mg | ORAL_TABLET | Freq: Two times a day (BID) | ORAL | Status: DC
  Administered 2015-04-18: 50 mg via ORAL
  Filled 2015-04-18: qty 1

## 2015-04-18 NOTE — Care Management Important Message (Signed)
Important Message  Patient Details  Name: [name] MRN: [id number] Date of Birth: 01-31-1924   Medicare Important Message Given:  Yes    Rory Xiang P Shelvy Perazzo 04/18/2015, 3:05 PM

## 2015-04-18 NOTE — Progress Notes (Addendum)
Subjective: 1 Day Post-Op Procedure(s) (LRB): HIP MONOPOLAR HIP HEMI ARTHROPLASTY (Right) Patient reports pain as mild.    Objective: Vital signs in last 24 hours: Temp:  [97.7 F (36.5 C)-98.9 F (37.2 C)] 98 F (36.7 C) (11/22 0523) Pulse Rate:  [87-133] 111 (11/22 0523) Resp:  [15-20] 20 (11/22 0523) BP: (104-147)/(57-99) 104/60 mmHg (11/22 0523) SpO2:  [93 %-100 %] 94 % (11/22 0523)  Intake/Output from previous day: 11/21 0701 - 11/22 0700 In: 2678.8 [P.O.:480; I.V.:2098.8; IV Piggyback:100] Out: 525 [Urine:425; Blood:100] Intake/Output this shift:     Recent Labs  04/15/15 1752 04/15/15 2213 04/17/15 0308 04/18/15 0540  HGB 11.8* 12.5 11.1* 10.0*    Recent Labs  04/17/15 0308 04/18/15 0540  WBC 11.5* 11.3*  RBC 3.57* 3.23*  HCT 34.4* 30.8*  PLT 173 160    Recent Labs  04/17/15 0308 04/18/15 0540  NA 139 138  K 3.7 3.8  CL 106 107  CO2 26 24  BUN 15 16  CREATININE 0.87 0.93  GLUCOSE 103* 106*  CALCIUM 8.3* 8.1*    Recent Labs  04/17/15 0308 04/18/15 0540  INR 1.63* 1.44  HGB is 10.0  Neurologically intact  Assessment/Plan: 1 Day Post-Op Procedure(s) (LRB): HIP MONOPOLAR HIP HEMI ARTHROPLASTY (Right) Up with therapy  Adeleigh Barletta C 04/18/2015, 7:26 AM

## 2015-04-18 NOTE — Clinical Social Work Placement (Signed)
   CLINICAL SOCIAL WORK PLACEMENT  NOTE  Date:  04/18/2015  Patient Details  Name: [name] MRN: [id number] Date of Birth: 29-Oct-1923  Clinical Social Work is seeking post-discharge placement for this patient at the [place] level of care (*CSW will initial, date and re-position this form in  chart as items are completed):  Yes   Patient/family provided with [facility]'s list of facilities offering this level of care within the geographic area requested by the patient (or if unable, by the patient's family).  Yes   Patient/family informed of their freedom to choose among providers that offer the needed level of care, that participate in Medicare, Medicaid or managed care program needed by the patient, have an available bed and are willing to accept the patient.  Yes   Patient/family informed of [facility]'s ownership interest in [facility] and [facility], as well as of the fact that they are under no obligation to receive care at these facilities.  PASRR submitted to EDS on 04/18/15     PASRR number received on 04/18/15     Existing PASRR number confirmed on       FL2 transmitted to all facilities in geographic area requested by pt/family on 04/18/15     FL2 transmitted to all facilities within larger geographic area on       Patient informed that his/her managed care company has contracts with or will negotiate with certain facilities, including the following:            Patient/family informed of bed offers received.  Patient chooses bed at       Physician recommends and patient chooses bed at      Patient to be transferred to   on  .  Patient to be transferred to facility by       Patient family notified on   of transfer.  Name of family member notified:        PHYSICIAN Please sign FL2     Additional Comment:    _______________________________________________ Caroline Sauger, LCSW 04/18/2015,  3:59 PM

## 2015-04-18 NOTE — Progress Notes (Signed)
[facility] for warfarin Indication: atrial fibrillation  Allergies  Allergen Reactions  . [name] [Ethacrynic Acid] Nausea And Vomiting  . Tape Other (See Comments)    Tears skin off.  Please use "paper" tape only.  . Furosemide Rash  . Sulfa Antibiotics Itching and Rash  . [name] Rash    Per Dr Sallyanne Kuster, had a skin reaction with thiazides prior to use of Lasix  . Torsemide Rash    Patient Measurements: Height: '5\' 3"'$  (160 cm) Weight: 131 lb (59.42 kg) IBW/kg (Calculated) : 52.4  Vital Signs: Temp: 98 F (36.7 C) (11/22 0523) Temp Source: Oral (11/22 0523) BP: 104/60 mmHg (11/22 0523) Pulse Rate: 111 (11/22 0523)  Labs:  Recent Labs  04/15/15 2213 04/16/15 0200 04/17/15 0308 04/18/15 0540  HGB 12.5  --  11.1* 10.0*  HCT 39.2  --  34.4* 30.8*  PLT 232  --  173 160  LABPROT  --  20.7* 19.4* 17.6*  INR  --  1.78* 1.63* 1.44  CREATININE  --  0.85 0.87 0.93    Estimated Creatinine Clearance: 33.9 mL/min (by C-G formula based on Cr of 0.93).  Assessment: 81 yo female admitted with closed right hip fx now post-op hip hemiarthroplasty. Coumadin resumed 11/21. INR remains low at 1.44. H/H slightly low and platelets are WNL. No overt bleeding noted.   Goal of Therapy:  INR 2-3 Monitor platelets by anticoagulation protocol: Yes   Plan:  - Warfarin '3mg'$  PO x 1 tonight - Daily INR  Salome Arnt, PharmD, BCPS Pager # [phone] 04/18/2015 11:37 AM

## 2015-04-18 NOTE — Op Note (Addendum)
NAMECRISTIANNA, [name]               ACCOUNT NO.:  [id number]  MEDICAL RECORD NO.:  [id number]  LOCATION:  5N28C                        FACILITY:  [facility]  PHYSICIAN:  Thedore Pickel C. Lorin Mercy, M.D.    DATE OF BIRTH:  [birth year]  DATE OF PROCEDURE:  04/17/2015 DATE OF DISCHARGE:                              OPERATIVE REPORT   PREOPERATIVE DIAGNOSIS:  Femoral neck fracture.  POSTOPERATIVE DIAGNOSIS:  Femoral neck fracture.  PROCEDURE:  Right hip press-fit hemiarthroplasty, 46-mm ball, 0 neck length and #2 stem, DePuy. SURGEON: Rodell Perna MD ASSISTANT: none ANESTHESIA:  General.  EBL:  Minimal.  DESCRIPTION OF PROCEDURE:  After induction of general anesthesia, preoperative Ancef prophylaxis, the patient was placed in lateral position, standard prep and draping on the lateral position.  The patient has small skin tag, she requested to be removed __________ and this was grasped with pickups after prepping and draping, and resected with a 10-blade at the base.  Skin marker was used, Betadine Steri-Drape was applied and posterior approach was made.  Time-out procedure was done before surgery started.  Charnley retractor was placed after tensor fascia and gluteus maximus had been split.  Gluteus was split in line with the fibers.  Piriformis was tagged for later repair.  Posterior capsule was opened.  Head was removed with a corkscrew, sized at 46, good suction fit.  Sequential preparation of the canal with cookie cutter, canal starter, lateralizer, then progressive broaching.  #2 gave excellent fit.  Neck cut was 1 fingerbreadth above the lesser trochanter.  #2 gave good fit, 1.5 ball was inserted with the monopolar. Hip was reduced, had flexion to 90 internal rotation and 90 with good stability.  Performs was repaired with gluteus medius.  Tensor fascia was repaired with #1 Ethibond suture, 2-0 Vicryl in the subcutaneous tissue, skin staple closure, Marcaine infiltration in the skin and  then postop knee immobilizer.  The patient tolerated the procedure well.     [name] C. Lorin Mercy, M.D.     MCY/MEDQ  D:  04/17/2015  T:  04/17/2015  Job:  [id number]

## 2015-04-18 NOTE — Clinical Social Work Note (Signed)
Clinical Social Work Assessment  Patient Details  Name: [name] MRN: [id number] Date of Birth: 1923-09-16  Date of referral:  04/18/15               Reason for consult:  Discharge Planning, Facility Placement                Permission sought to share information with:  Chartered certified accountant granted to share information::  Yes, Verbal Permission Granted  Name::        Agency::  [facility] (preference for [place], or [place] area)  Relationship::     Contact Information:     Housing/Transportation Living arrangements for the past 2 months:  [place] of Information:  Patient Patient Interpreter Needed:  None Criminal Activity/Legal Involvement Pertinent to Current Situation/Hospitalization:  No - Comment as needed Significant Relationships:  Spouse Lives with:  Spouse Do you feel safe going back to the place where you live?  No (High fall risk.) Need for family participation in patient care:  No (Coment) (Patient able to make own decisions.)  Care giving concerns:  Patient expressed no concerns at this time.   Social Worker assessment / plan:  CSW received referral for possible SNF placement at time of discharge. CSW met with patient at bedside to discuss discharge planning needs. Patient's family (brother-in-law and wife) present at bedside. Patient verbally approved for CSW to continue speaking with patient and patient's family regarding SNF placement. Per patient, patient would prefer to be placed in the [place], [place], or [place] area. CSW to continue to follow and assist with discharge planning needs.  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  Patient/Family's Understanding of and Emotional Response to Diagnosis, Current Treatment, and Prognosis:  Patient understanding and agreeable to CSW plan of care.  Emotional Assessment Appearance:  Appears stated age Attitude/Demeanor/Rapport:  Other (Pleasant.) Affect (typically observed):  Accepting, Appropriate, Pleasant Orientation:  Oriented to Self, Oriented to Place, Oriented to  Time, Oriented to Situation Alcohol / Substance use:  Not Applicable Psych involvement (Current and /or in the community):  No (Comment) (Not appropriate on this admission.)  Discharge Needs  Concerns to be addressed:  No discharge needs identified Readmission within the last 30 days:  No Current discharge risk:  None Barriers to Discharge:  No Barriers Identified   Caroline Sauger, LCSW 04/18/2015, 3:44 PM [phone]

## 2015-04-18 NOTE — Evaluation (Signed)
Occupational Therapy Evaluation Patient Details Name: [name] MRN: [id number] DOB: [birth year] Today's Date: 04/18/2015    History of Present Illness Pt admitted after fall at home with Right femur fx s/p right hemiarthroplasty ,past medical history of chronic diastolic CHF, hypothyroidism, hyperlipidemia, hypertension, paroxysmal atrial fibrillation, CAD, osteoarthritis   Clinical Impression   Pt was independent prior to admission and lives with her 68 year old husband.  Pt presents with post operative pain, generalized weakness, and poor balance interfering with ability to perform self care and mobility. She currently requires 2 person assist for all mobility. Pt will need SNF level rehab prior to return home.  Will follow acutely.    Follow Up Recommendations  SNF;Supervision/Assistance - 24 hour    Equipment Recommendations       Recommendations for Other Services       Precautions / Restrictions Precautions Precautions: Fall;Posterior Hip Precaution Booklet Issued: Yes (comment) Precaution Comments: instructed in posterior hip precautions  Required Braces or Orthoses: Knee Immobilizer - Right (no orders written) Restrictions Weight Bearing Restrictions: Yes RLE Weight Bearing: Weight bearing as tolerated      Mobility Bed Mobility Overal bed mobility: Needs Assistance;+2 for physical assistance Bed Mobility: Sit to Supine;Supine to Sit     Supine to sit: Mod assist;+2 for physical assistance Sit to supine: +2 for physical assistance;Mod assist      Transfers                      Balance     Sitting balance-Leahy Scale: Fair       Standing balance-Leahy Scale: Poor                              ADL Overall ADL's : Needs assistance/impaired Eating/Feeding: Independent;Sitting   Grooming: Set up;Sitting;Wash/dry hands;Wash/dry face;Oral care;Brushing hair   Upper Body Bathing: Set up;Sitting   Lower Body Bathing: +2 for  physical assistance;Maximal assistance;Sit to/from stand   Upper Body Dressing : Set up;Sitting   Lower Body Dressing: +2 for physical assistance;Maximal assistance;Sit to/from stand   Toilet Transfer: +2 for physical assistance;Moderate assistance;Stand-pivot;BSC;RW   Toileting- Clothing Manipulation and Hygiene: +2 for physical assistance;Maximal assistance;Sit to/from stand         General ADL Comments: Showed pt AE for LB ADL, did not practice this visit.     Vision     Perception     Praxis      Pertinent Vitals/Pain Pain Assessment: Faces Faces Pain Scale: Hurts little more Pain Location: R hip Pain Descriptors / Indicators: Sore;Operative site guarding Pain Intervention(s): Limited activity within patient's tolerance;Monitored during session;Premedicated before session;Patient requesting pain meds-RN notified;Repositioned     Hand Dominance Right   Extremity/Trunk Assessment Upper Extremity Assessment Upper Extremity Assessment: Overall WFL for tasks assessed   Lower Extremity Assessment Lower Extremity Assessment: Defer to PT evaluation   Cervical / Trunk Assessment Cervical / Trunk Assessment: Kyphotic   Communication Communication Communication: No difficulties   Cognition Arousal/Alertness: Awake/alert Behavior During Therapy: WFL for tasks assessed/performed Overall Cognitive Status: Within Functional Limits for tasks assessed                     General Comments       Exercises       Shoulder Instructions      Home Living Family/patient expects to be discharged to:: Private residence Living Arrangements: Spouse/significant other Available Help at Discharge:  Available 24 hours/day Type of Home: House Home Access: Stairs to enter [employer] of Steps: 1 Entrance Stairs-Rails: Right Home Layout: One level     Bathroom Shower/Tub: Tub/shower unit;Curtain   Bathroom Toilet: Handicapped height     Home Equipment:  Grab bars - tub/shower          Prior Functioning/Environment Level of Independence: Independent             OT Diagnosis: Generalized weakness;Acute pain   OT Problem List: Decreased strength;Decreased activity tolerance;Impaired balance (sitting and/or standing);Decreased knowledge of use of DME or AE;Decreased knowledge of precautions;Pain   OT Treatment/Interventions: Self-care/ADL training;DME and/or AE instruction;Therapeutic activities;Patient/family education;Balance training    OT Goals(Current goals can be found in the care plan section) Acute Rehab OT Goals Patient Stated Goal: Return home OT Goal Formulation: With patient Time For Goal Achievement: 05/02/15 Potential to Achieve Goals: Good ADL Goals Pt Will Transfer to Toilet: with min assist;stand pivot transfer;bedside commode Pt Will Perform Toileting - Clothing Manipulation and hygiene: with min assist;sit to/from stand Additional ADL Goal #1: Pt will state 3/3 hip precautions. Additional ADL Goal #2: Pt will perform bed mobility with min assist in preparation for ADL at EOB.  OT Frequency: Min 2X/week   Barriers to D/C:            Co-evaluation              End of Session Equipment Utilized During Treatment: Gait belt  Activity Tolerance: Patient tolerated treatment well Patient left: in bed;with call bell/phone within reach;with family/visitor present   Time: 0786-7544 OT Time Calculation (min): 18 min Charges:  OT General Charges $OT Visit: 1 Procedure OT Evaluation $Initial OT Evaluation Tier I: 1 Procedure G-Codes:    Malka So 04/18/2015, 3:57 PM  [phone]

## 2015-04-18 NOTE — NC FL2 (Signed)
[place] LEVEL OF CARE SCREENING TOOL     IDENTIFICATION  Patient Name: [name] Birthdate: 1924/03/05 Sex: female Admission Date (Current Location): 04/15/2015  [facility] and [place] Number:     Facility and Address:  [facility], [facility] [address], [place], [place] [address]      Provider Number: [id number]  Attending Physician Name and Address:  Bonnielee Haff, MD  Relative Name and Phone Number:       Current Level of Care: Hospital Recommended Level of Care: [place] Prior Approval Number:    Date Approved/Denied:   PASRR Number: [id number]  Discharge Plan: SNF    Current Diagnoses: Patient Active Problem List   Diagnosis Date Noted  . Closed right hip fracture ([place]) 04/15/2015  . Senile purpura ([place]) 04/13/2015  . Angioimmunoblastic lymphoma ([place]) 04/05/2015  . PAF (paroxysmal atrial fibrillation) ([place])   . Arthritis 09/13/2014  . Intermittent LBBB 08/16/2014  . Drug-induced bradycardia 02/20/2014  . Prolonged Q-T interval on ECG 11/02/2012  . Long term current use of anticoagulant therapy 08/12/2012  . Aortic valvular stenosis, moderate to severe 06/24/2011  . Chronic diastolic heart failure ([place]) 06/21/2011  . Hyperlipidemia 06/21/2011  . HTN (hypertension) 06/21/2011  . Hypothyroidism 06/21/2011  . Coronary artery disease 06/21/2011    Orientation ACTIVITIES/SOCIAL BLADDER RESPIRATION    Self, Time, Situation, Place  Family supportive Continent Normal  BEHAVIORAL SYMPTOMS/MOOD NEUROLOGICAL BOWEL NUTRITION STATUS  Other (Comment) (n/a)  (n/a) Continent Diet (Please see discharge summary.)  PHYSICIAN VISITS COMMUNICATION OF NEEDS Height & Weight Skin    Verbally '5\' 3"'$  (160 cm) 131 lbs. Surgical wounds          AMBULATORY STATUS RESPIRATION    Assist extensive Normal      Personal Care Assistance Level of Assistance  Bathing, Feeding, Dressing Bathing Assistance: Limited  assistance Feeding assistance: Independent Dressing Assistance: Limited assistance      Functional Limitations Info   (n/a)             [place]  PT (By licensed PT), OT (By licensed OT)     PT Frequency: 5 OT Frequency: 5           Additional Factors Info  Code Status, Allergies Code Status Info: FULL Allergies Info: [name], Furosemide, Sulfa Antibiotics, Torsemide, [name] Ethacrynic Acid, Tape           Current Medications (04/18/2015): Current Facility-Administered Medications  Medication Dose Route Frequency Provider Last Rate Last Dose  . acetaminophen (TYLENOL) tablet 650 mg  650 mg Oral Q6H PRN Lanae Crumbly, PA-C       Or  . acetaminophen (TYLENOL) suppository 650 mg  650 mg Rectal Q6H PRN Lanae Crumbly, PA-C      . acetaminophen (TYLENOL) tablet 650 mg  650 mg Oral TID Reubin Milan, MD   650 mg at 04/18/15 0919  . atorvastatin (LIPITOR) tablet 40 mg  40 mg Oral QHS Reubin Milan, MD   40 mg at 04/17/15 2101  . docusate sodium (COLACE) capsule 100 mg  100 mg Oral BID Lanae Crumbly, PA-C   100 mg at 04/18/15 5809  . feeding supplement (ENSURE ENLIVE) (ENSURE ENLIVE) liquid 237 mL  237 mL Oral Daily Dale [place], RD      . fluticasone [facility]) 50 MCG/ACT nasal spray 2 spray  2 spray Each Nare Daily Reubin Milan, MD   2 spray at 04/18/15 0919  .  folic acid (FOLVITE) tablet 1 mg  1 mg Oral BID WC Reubin Milan, MD   1 mg at 04/18/15 0919  . HYDROcodone-acetaminophen (NORCO) 7.5-325 MG per tablet 1 tablet  1 tablet Oral Q6H PRN Lanae Crumbly, PA-C   1 tablet at 04/18/15 0518  . ipratropium (ATROVENT) 0.06 % nasal spray 2 spray  2 spray Each Nare BID Bonnielee Haff, MD   2 spray at 04/18/15 0920  . lactated ringers infusion   Intravenous Continuous Effie Berkshire, MD 10 mL/hr at 04/17/15 1348    . levothyroxine (SYNTHROID, LEVOTHROID) tablet 75 mcg  75 mcg Oral QAC breakfast Reubin Milan, MD    75 mcg at 04/18/15 0518  . loratadine (CLARITIN) tablet 10 mg  10 mg Oral Q breakfast Reubin Milan, MD   10 mg at 04/18/15 0919  . magnesium citrate solution 1 Bottle  1 Bottle Oral Once PRN Reubin Milan, MD      . menthol-cetylpyridinium (CEPACOL) lozenge 3 mg  1 lozenge Oral PRN Lanae Crumbly, PA-C       Or  . phenol (CHLORASEPTIC) mouth spray 1 spray  1 spray Mouth/Throat PRN Lanae Crumbly, PA-C      . metoCLOPramide (REGLAN) tablet 5-10 mg  5-10 mg Oral Q8H PRN Lanae Crumbly, PA-C       Or  . metoCLOPramide (REGLAN) injection 5-10 mg  5-10 mg Intravenous Q8H PRN Lanae Crumbly, PA-C      . metoprolol (LOPRESSOR) injection 2.5 mg  2.5 mg Intravenous Q6H PRN Bonnielee Haff, MD      . metoprolol (LOPRESSOR) tablet 50 mg  50 mg Oral BID WC Bonnielee Haff, MD      . morphine 2 MG/ML injection 1 mg  1 mg Intravenous Q3H PRN Lanae Crumbly, PA-C      . multivitamin with minerals tablet 1 tablet  1 tablet Oral Daily Reubin Milan, MD   1 tablet at 04/18/15 0919  . ondansetron (ZOFRAN) tablet 4 mg  4 mg Oral Q6H PRN Reubin Milan, MD       Or  . ondansetron [facility]) injection 4 mg  4 mg Intravenous Q6H PRN Reubin Milan, MD      . ondansetron [facility]) tablet 4 mg  4 mg Oral Q6H PRN Lanae Crumbly, PA-C       Or  . ondansetron [facility]) injection 4 mg  4 mg Intravenous Q6H PRN Lanae Crumbly, PA-C      . polyethylene glycol (MIRALAX / GLYCOLAX) packet 17 g  17 g Oral Daily PRN Bonnielee Haff, MD      . senna-docusate (Senokot-S) tablet 1 tablet  1 tablet Oral QHS PRN Reubin Milan, MD      . sodium chloride 0.9 % injection 3 mL  3 mL Intravenous Q12H Reubin Milan, MD   3 mL at 04/17/15 2200  . spironolactone (ALDACTONE) tablet 25 mg  25 mg Oral Daily Reubin Milan, MD   25 mg at 04/17/15 0958  . warfarin (COUMADIN) tablet 3 mg  3 mg Oral ONCE-1800 Rachel L Rumbarger, RPH      . Warfarin - Pharmacist Dosing Inpatient   Does not apply [place], [place]        Do not use this list as official medication orders. Please verify with discharge summary.  Discharge Medications:   Medication List    ASK your doctor about these medications  acetaminophen 650 MG CR tablet  Commonly known as:  TYLENOL  Take 650 mg by mouth 3 (three) times daily. Morning, supper and bedtime     albuterol 108 (90 BASE) MCG/ACT inhaler  Commonly known as:  PROVENTIL HFA;VENTOLIN HFA  Inhale 2 puffs into the lungs every 6 (six) hours as needed for wheezing or shortness of breath.     atorvastatin 40 MG tablet  Commonly known as:  LIPITOR  TAKE 1 TABLET DAILY     fluticasone 50 MCG/ACT nasal spray  Commonly known as:  FLONASE  Place 2 sprays into both nostrils daily. Use as directed     folic acid 1 MG tablet  Commonly known as:  FOLVITE  Take 2 tablets (2 mg total) by mouth daily.     ipratropium 0.03 % nasal spray  Commonly known as:  ATROVENT  Place 2 sprays into both nostrils 2 (two) times daily. Use as directed     levothyroxine 75 MCG tablet  Commonly known as:  SYNTHROID, LEVOTHROID  Take 1 tablet (75 mcg total) by mouth daily before breakfast.     loratadine 10 MG tablet  Commonly known as:  CLARITIN  Take 10 mg by mouth daily with breakfast.     metoprolol tartrate 25 MG tablet  Commonly known as:  LOPRESSOR  Take 1 tablet (25 mg total) by mouth 2 (two) times daily.     multivitamin with minerals Tabs tablet  Take 1 tablet by mouth daily. Centrum Silver     spironolactone 25 MG tablet  Commonly known as:  ALDACTONE  Take 1 tablet (25 mg total) by mouth daily.     TEARS PLUS OP  Place 1 drop into both eyes at bedtime.     triamcinolone cream 0.1 %  Commonly known as:  KENALOG  Apply 1 application topically daily as needed (rash).     warfarin 1 MG tablet  Commonly known as:  COUMADIN  Take 2 tablets by mouth daily or as directed by coumadin clinic        Relevant Imaging Results:  Relevant Lab Results:  Recent  Labs    Additional [place] #: [id number]  Luna Kitchens [phone]

## 2015-04-18 NOTE — Discharge Instructions (Addendum)
°Information on my medicine - Coumadin®   (Warfarin) ° °This medication education was reviewed with me or my healthcare representative as part of my discharge preparation.  The pharmacist that spoke with me during my hospital stay was:  Wilson, Frank Rhea, RPH ° °Why was Coumadin prescribed for you? °Coumadin was prescribed for you because you have a blood clot or a medical condition that can cause an increased risk of forming blood clots. Blood clots can cause serious health problems by blocking the flow of blood to the heart, lung, or brain. Coumadin can prevent harmful blood clots from forming. °As a reminder your indication for Coumadin is:   Stroke Prevention Because Of Atrial Fibrillation ° °What test will check on my response to Coumadin? °While on Coumadin (warfarin) you will need to have an INR test regularly to ensure that your dose is keeping you in the desired range. The INR (international normalized ratio) number is calculated from the result of the laboratory test called prothrombin time (PT). ° °If an INR APPOINTMENT HAS NOT ALREADY BEEN MADE FOR YOU please schedule an appointment to have this lab work done by your health care provider within 7 days. °Your INR goal is usually a number between:  2 to 3 or your provider may give you a more narrow range like 2-2.5.  Ask your health care provider during an office visit what your goal INR is. ° °What  do you need to  know  About  COUMADIN? °Take Coumadin (warfarin) exactly as prescribed by your healthcare provider about the same time each day.  DO NOT stop taking without talking to the doctor who prescribed the medication.  Stopping without other blood clot prevention medication to take the place of Coumadin may increase your risk of developing a new clot or stroke.  Get refills before you run out. ° °What do you do if you miss a dose? °If you miss a dose, take it as soon as you remember on the same day then continue your regularly scheduled regimen the  next day.  Do not take two doses of Coumadin at the same time. ° °Important Safety Information °A possible side effect of Coumadin (Warfarin) is an increased risk of bleeding. You should call your healthcare provider right away if you experience any of the following: °  Bleeding from an injury or your nose that does not stop. °  Unusual colored urine (red or dark brown) or unusual colored stools (red or black). °  Unusual bruising for unknown reasons. °  A serious fall or if you hit your head (even if there is no bleeding). ° °Some foods or medicines interact with Coumadin® (warfarin) and might alter your response to warfarin. To help avoid this: °  Eat a balanced diet, maintaining a consistent amount of Vitamin K. °  Notify your provider about major diet changes you plan to make. °  Avoid alcohol or limit your intake to 1 drink for women and 2 drinks for men per day. °(1 drink is 5 oz. wine, 12 oz. beer, or 1.5 oz. liquor.) ° °Make sure that ANY health care provider who prescribes medication for you knows that you are taking Coumadin (warfarin).  Also make sure the healthcare provider who is monitoring your Coumadin knows when you have started a new medication including herbals and non-prescription products. ° °Coumadin® (Warfarin)  Major Drug Interactions  °Increased Warfarin Effect Decreased Warfarin Effect  °Alcohol (large quantities) °Antibiotics (esp. Septra/Bactrim, Flagyl, Cipro) °Amiodarone (Cordarone) °Aspirin (  naproxen, etc.) Piroxicam (Feldene) Propafenone (Rythmol SR) Propranolol (Inderal) Isoniazid (INH) Posaconazole (Noxafil) Barbiturates (Phenobarbital) Carbamazepine (Tegretol) Chlordiazepoxide (Librium) Cholestyramine (Questran) Griseofulvin Oral Contraceptives Rifampin Sucralfate (Carafate) Vitamin K   Coumadin (Warfarin) Major Herbal Interactions  Increased Warfarin Effect Decreased Warfarin Effect   Garlic Ginseng Ginkgo biloba Coenzyme Q10 Green tea St. Johns wort    Coumadin (Warfarin) FOOD Interactions  Eat a consistent number of servings per week of foods HIGH in Vitamin K (1 serving =  cup)  Collards (cooked, or boiled & drained) Kale (cooked, or boiled & drained) Mustard greens (cooked, or boiled & drained) Parsley *serving size only =  cup Spinach (cooked, or boiled & drained) Swiss chard (cooked, or boiled & drained) Turnip greens (cooked, or boiled & drained)  Eat a consistent number of servings per week of foods MEDIUM-HIGH in Vitamin K (1 serving = 1 cup)  Asparagus (cooked, or boiled & drained) Broccoli (cooked, boiled & drained, or raw & chopped) Brussel sprouts (cooked, or boiled & drained) *serving size only =  cup Lettuce, raw (green leaf, endive, romaine) Spinach, raw Turnip greens, raw & chopped   These websites have more information on Coumadin (warfarin):  FailFactory.se; VeganReport.com.au;   INSTRUCTIONS AFTER LEFT HIP JOINT REPLACEMENT   o Remove items at home which could result in a fall. This includes throw rugs or furniture in walking pathways o ICE to the affected joint every three hours while awake for 30 minutes at a time, for at least the first 3-5 days, and then as needed for pain and swelling.  Continue to use ice for pain and swelling. You may notice swelling that will progress down to the foot and ankle.  This is normal after surgery.  Elevate your leg when you are not up walking on it.   o Continue to use the breathing machine you got in the hospital (incentive spirometer) which will help keep your temperature down.  It is common for your temperature to cycle up and down following surgery, especially at night when you are not up moving around and exerting yourself.  The breathing machine keeps your lungs expanded and your temperature down.   DIET:  As you were doing prior to hospitalization, we recommend a  well-balanced diet.  DRESSING / WOUND CARE / SHOWERING  You may change your dressing 3-5 days after surgery.  Then change the dressing every day with sterile gauze.  Please use good hand washing techniques before changing the dressing.  Do not use any lotions or creams on the incision until instructed by your surgeon.  ACTIVITY  o Increase activity slowly as tolerated, but follow the weight bearing instructions below.   o No driving for 6 weeks or until further direction given by your physician.  You cannot drive while taking narcotics.  o No lifting or carrying greater than 10 lbs. until further directed by your surgeon. o Avoid periods of inactivity such as sitting longer than an hour when not asleep. This helps prevent blood clots.  o You may return to work once you are authorized by your doctor.     WEIGHT BEARING   Weight bearing as tolerated with assist device (walker, cane, etc) as directed, use it as long as suggested by your surgeon or therapist, typically at least 4-6 weeks.   EXERCISES  Results after joint replacement surgery are often greatly improved when you follow the exercise, range of motion and muscle strengthening exercises prescribed by your doctor. Safety measures are also important to  protect the joint from further injury. Any time any of these exercises cause you to have increased pain or swelling, decrease what you are doing until you are comfortable again and then slowly increase them. If you have problems or questions, call your caregiver or physical therapist for advice.   Rehabilitation is important following a joint replacement. After just a few days of immobilization, the muscles of the leg can become weakened and shrink (atrophy).  These exercises are designed to build up the tone and strength of the thigh and leg muscles and to improve motion. Often times heat used for twenty to thirty minutes before working out will loosen up your tissues and help with  improving the range of motion but do not use heat for the first two weeks following surgery (sometimes heat can increase post-operative swelling).   These exercises can be done on a training (exercise) mat, on the floor, on a table or on a bed. Use whatever works the best and is most comfortable for you.    Use music or television while you are exercising so that the exercises are a pleasant break in your day. This will make your life better with the exercises acting as a break in your routine that you can look forward to.   Perform all exercises about fifteen times, three times per day or as directed.  You should exercise both the operative leg and the other leg as well.  Exercises include:    Quad Sets - Tighten up the muscle on the front of the thigh (Quad) and hold for 5-10 seconds.    Straight Leg Raises - With your knee straight (if you were given a brace, keep it on), lift the leg to 60 degrees, hold for 3 seconds, and slowly lower the leg.  Perform this exercise against resistance later as your leg gets stronger.   Leg Slides: Lying on your back, slowly slide your foot toward your buttocks, bending your knee up off the floor (only go as far as is comfortable). Then slowly slide your foot back down until your leg is flat on the floor again.   Angel Wings: Lying on your back spread your legs to the side as far apart as you can without causing discomfort.   Hamstring Strength:  Lying on your back, push your heel against the floor with your leg straight by tightening up the muscles of your buttocks.  Repeat, but this time bend your knee to a comfortable angle, and push your heel against the floor.  You may put a pillow under the heel to make it more comfortable if necessary.   A rehabilitation program following joint replacement surgery can speed recovery and prevent re-injury in the future due to weakened muscles. Contact your doctor or a physical therapist for more information on knee  rehabilitation.    CONSTIPATION  Constipation is defined medically as fewer than three stools per week and severe constipation as less than one stool per week.  Even if you have a regular bowel pattern at home, your normal regimen is likely to be disrupted due to multiple reasons following surgery.  Combination of anesthesia, postoperative narcotics, change in appetite and fluid intake all can affect your bowels.   YOU MUST use at least one of the following options; they are listed in order of increasing strength to get the job done.  They are all available over the counter, and you may need to use some, POSSIBLY even all of these options:  Drink plenty of fluids (prune juice may be helpful) and high fiber foods Colace 100 mg by mouth twice a day  Senokot for constipation as directed and as needed Dulcolax (bisacodyl), take with full glass of water  Miralax (polyethylene glycol) once or twice a day as needed.  If you have tried all these things and are unable to have a bowel movement in the first 3-4 days after surgery call either your surgeon or your primary doctor.    If you experience loose stools or diarrhea, hold the medications until you stool forms back up.  If your symptoms do not get better within 1 week or if they get worse, check with your doctor.  If you experience "the worst abdominal pain ever" or develop nausea or vomiting, please contact the office immediately for further recommendations for treatment.   ITCHING:  If you experience itching with your medications, try taking only a single pain pill, or even half a pain pill at a time.  You can also use Benadryl over the counter for itching or also to help with sleep.   TED HOSE STOCKINGS:  Use stockings on both legs until for at least 2 weeks or as directed by physician office. They may be removed at night for sleeping.  MEDICATIONS:  See your medication summary on the After Visit Summary that nursing will review with you.   You may have some home medications which will be placed on hold until you complete the course of blood thinner medication.  It is important for you to complete the blood thinner medication as prescribed.  PRECAUTIONS:  If you experience chest pain or shortness of breath - call 911 immediately for transfer to the hospital emergency department.   If you develop a fever greater that 101 F, purulent drainage from wound, increased redness or drainage from wound, foul odor from the wound/dressing, or calf pain - CONTACT YOUR SURGEON.                                                   FOLLOW-UP APPOINTMENTS:  If you do not already have a post-op appointment, please call the office for an appointment to be seen by your surgeon.  Guidelines for how soon to be seen are listed in your After Visit Summary, but are typically between 1-4 weeks after surgery.  OTHER INSTRUCTIONS:   Knee Replacement:  Do not place pillow under knee, focus on keeping the knee straight while resting. CPM instructions: 0-90 degrees, 2 hours in the morning, 2 hours in the afternoon, and 2 hours in the evening. Place foam block, curve side up under heel at all times except when in CPM or when walking.  DO NOT modify, tear, cut, or change the foam block in any way.  MAKE SURE YOU:   Understand these instructions.   Get help right away if you are not doing well or get worse.    Thank you for letting us be a part of your medical care team.  It is a privilege we respect greatly.  We hope these instructions will help you stay on track for a fast and full recovery!

## 2015-04-18 NOTE — Progress Notes (Signed)
[facility] PROGRESS NOTE  [name] WHQ:[id number] DOB: 1924/02/15 DOA: 04/15/2015  PCP: Wyatt Haste, MD  Brief HPI: 81 year old Caucasian female with a past medical history of chronic diastolic CHF, hypothyroidism, hyperlipidemia, paroxysmal atrial fibrillation, coronary artery disease, presented after a mechanical fall at home resulting in fracture of the right hip. She was hospitalized for further management.  Past medical history:  Past Medical History  Diagnosis Date  . Chronic diastolic CHF (congestive heart failure) ([place])     a. 10/2012 Echo: EF 55-60%, no rwma, Gr 2 DD, moderate AS, mod dil LA, mildly to mod dil RA.  Marland Kitchen Hypothyroidism   . Dyslipidemia   . Hypertension   . PAF (paroxysmal atrial fibrillation) ([place])     a. CHA2DS2VASc = 7-->chronic coumadin;  b. s/p DCCV 05/2011, 05/2012, 07/2014;  c. On amio.  Marland Kitchen  Acute respiratiory failure requiring BiPap 06/24/2011  . Aortic valvular stenosis, moderate to severe 06/24/2011    a. 10/2012 Echo: mod AS, Valve 1.25 cm^2 (VTI), 1.19cm^2 (Vmax).  . Allergy   . Coronary artery disease     a. 03/2009 PCI RCA (3.0x18 BMS).  . Exertional shortness of breath   . Arthritis     "back" (10/30/2012)  . Rash     a. felt to be 2/2 lasix/torsemide in setting of sulfa allergy (lasix d/c ~ 06/2014, torsemide d/c 08/09/2014).    Consultants: Orthopedics  Procedures: Right hip replacement for hip fracture 11/21  Antibiotics: None  Subjective: Patient feels well. Denies any problems postoperatively. Denies any pain currently. Denies chest pain, shortness of breath. No nausea, vomiting.   Objective: Vital Signs  Filed Vitals:   04/17/15 2056 04/18/15 0022 04/18/15 0523 04/18/15 0819  BP: 146/90 118/58 104/60   Pulse: 133 130 111   Temp: 98.9 F (37.2 C) 98.5 F (36.9 C) 98 F (36.7 C)   TempSrc: Oral Oral Oral   Resp: '20 20 20   '$ Height:      Weight:      SpO2: 100% 100% 94% 91%    Intake/Output Summary (Last  24 hours) at 04/18/15 0824 Last data filed at 04/18/15 0657  Gross per 24 hour  Intake 2678.75 ml  Output    375 ml  Net 2303.75 ml   Filed Weights   04/15/15 1625 04/15/15 2125  Weight: 59.421 kg (131 lb) 59.42 kg (131 lb)    General appearance: alert, cooperative, appears stated age and no distress Resp: clear to auscultation bilaterally Cardio: S1, S2 irregularly irregular. Mildly tachycardic this morning. Systolic murmur appreciated over the aortic area. No S3, S4. No rubs, or bruit. GI: soft, non-tender; bowel sounds normal; no masses,  no organomegaly Extremities: No bruising over the right thigh. No significant swelling. Neurologic: Alert and oriented 3. Cranial nerves II-12 intact.  Lab Results:  Basic Metabolic Panel:  Recent Labs Lab 04/15/15 1752 04/15/15 2053 04/16/15 0200 04/17/15 0308 04/18/15 0540  NA 139  --  137 139 138  K 4.9  --  4.3 3.7 3.8  CL 108  --  105 106 107  CO2 24  --  '25 26 24  '$ GLUCOSE 123*  --  123* 103* 106*  BUN 17  --  '16 15 16  '$ CREATININE 0.87  --  0.85 0.87 0.93  CALCIUM 9.0  --  8.5* 8.3* 8.1*  MG  --  2.2  --   --   --   PHOS  --  3.7  --   --   --  Liver Function Tests:  Recent Labs Lab 04/16/15 0200  AST 21  ALT 16  ALKPHOS 72  BILITOT 0.9  PROT 6.5  ALBUMIN 3.3*   CBC:  Recent Labs Lab 04/15/15 1752 04/15/15 2213 04/17/15 0308 04/18/15 0540  WBC 12.7* 16.8* 11.5* 11.3*  NEUTROABS 9.1* 14.6*  --   --   HGB 11.8* 12.5 11.1* 10.0*  HCT 37.7 39.2 34.4* 30.8*  MCV 95.9 94.7 96.4 95.4  PLT 212 232 173 160   CBG: No results for input(s): GLUCAP in the last 168 hours.  Recent Results (from the past 240 hour(s))  Culture, Urine     Status: None   Collection Time: 04/16/15 12:33 AM  Result Value Ref Range Status   Specimen Description URINE, CATHETERIZED  Final   Special Requests NONE  Final   Culture NO GROWTH 1 DAY  Final   Report Status 04/17/2015 FINAL  Final  Surgical pcr screen     Status: None    Collection Time: 04/16/15 12:33 AM  Result Value Ref Range Status   MRSA, PCR NEGATIVE NEGATIVE Final   Staphylococcus aureus NEGATIVE NEGATIVE Final    Comment:        The Xpert SA Assay (FDA approved for NASAL specimens in patients over 31 years of age), is one component of a comprehensive surveillance program.  Test performance has been validated by [facility] for patients greater than or equal to 36 year old. It is not intended to diagnose infection nor to guide or monitor treatment.       Studies/Results: No results found.  Medications:  Scheduled: . acetaminophen  650 mg Oral TID  . atorvastatin  40 mg Oral QHS  . docusate sodium  100 mg Oral BID  . feeding supplement (ENSURE ENLIVE)  237 mL Oral Daily  . fluticasone  2 spray Each Nare Daily  . folic acid  1 mg Oral BID WC  . ipratropium  2 spray Each Nare BID  . levothyroxine  75 mcg Oral QAC breakfast  . loratadine  10 mg Oral Q breakfast  . metoprolol tartrate  25 mg Oral BID WC  . multivitamin with minerals  1 tablet Oral Daily  . sodium chloride  3 mL Intravenous Q12H  . spironolactone  25 mg Oral Daily  . Warfarin - Pharmacist Dosing Inpatient   Does not apply q1800   Continuous: . lactated ringers 10 mL/hr at 04/17/15 1348   HYQ:[id number] **OR** acetaminophen, HYDROcodone-acetaminophen, magnesium citrate, menthol-cetylpyridinium **OR** phenol, metoCLOPramide **OR** metoCLOPramide (REGLAN) injection, metoprolol, morphine injection, ondansetron **OR** ondansetron (ZOFRAN) IV, ondansetron **OR** ondansetron (ZOFRAN) IV, senna-docusate  Assessment/Plan:  Principal Problem:   Closed right hip fracture (HCC) Active Problems:   Chronic diastolic heart failure (HCC)   Hyperlipidemia   HTN (hypertension)   Hypothyroidism   Coronary artery disease   Aortic valvular stenosis, moderate to severe   Long term current use of anticoagulant therapy   PAF (paroxysmal atrial fibrillation) (HCC)    Closed  right hip fracture  Patient is status post right hip replacement for hip fracture. This appears to be medically stable postoperatively. Pain control. PT and OT eval.   PAF (paroxysmal atrial fibrillation) Heart rate is not optimally controlled. Increase the dose of her beta blocker. Warfarin has been reinitiated. Continue to monitor on telemetry.  Chronic diastolic heart failure Currently well compensated and euvolemic. Continue spironolactone. Monitor volume status closely.  Coronary artery disease Status post bare metal stent to the RCA in 2010. Currently stable.  EKG does not show any ischemic changes. Patient denies any chest pain or shortness of breath.  Aortic valvular stenosis, moderate  Appears to be asymptomatic. Continue to monitor.   Hyperlipidemia Continue atorvastatin.  Essential HTN (hypertension) Continue metoprolol and spironolactone. Monitor blood pressure closely  Hypothyroidism Continue levothyroxine.   Leukocytosis Most likely due to acute stress. Improved. No evidence for infection. Continue to monitor. She is afebrile.  DVT Prophylaxis: Warfarin has been resumed   Code Status: Full code  Family Communication: Discussed with the patient  Disposition Plan: Await PT and OT evaluation. She hopes to go home, but may end up requiring short-term rehabilitation at a SNF.    LOS: 3 days   [facility] Pager [phone] 04/18/2015, 8:24 AM  If 7PM-7AM, please contact night-coverage at [email or web address], password [facility]

## 2015-04-18 NOTE — Care Management Note (Signed)
Case Management Note  Patient Details  Name: [name] MRN: [id number] Date of Birth: 05/28/1923  Subjective/Objective:          Admitted with right hip fracture, s/p right hip hemiarthroplasty 04/17/15          Action/Plan: PT recommended SNF. Referral made to CSW, [place] working on SNF placement for therpy.   Expected Discharge Date:                  Expected Discharge Plan:  Skilled Nursing Facility  In-House Referral:  Clinical Social Work  Discharge planning Services  CM Consult  Post Acute Care Choice:  NA Choice offered to:     DME Arranged:    DME Agency:     HH Arranged:    [place] Agency:     Status of Service:  In process, will continue to follow  Medicare Important Message Given:    Date Medicare IM Given:    Medicare IM give by:    Date Additional Medicare IM Given:    Additional Medicare Important Message give by:     If discussed at [place] of Stay Meetings, dates discussed:    Additional Comments:  [name], [name], RN 04/18/2015, 11:24 AM

## 2015-04-18 NOTE — Evaluation (Signed)
Physical Therapy Evaluation Patient Details Name: [name] MRN: [id number] DOB: 1924-04-25 Today's Date: 04/18/2015   History of Present Illness  Pt admitted after fall at home with Right femur fx s/p right hemiarthroplasty ,past medical history of chronic diastolic CHF, hypothyroidism, hyperlipidemia, hypertension, paroxysmal atrial fibrillation, CAD, osteoarthritis  Clinical Impression  Pt presents with generalized LE weakness and decreased ROM, balance, and gait. Sp02 decreased to 86% on room air when sitting at EOB but returned to 94% with nasal cannula at 1L/min. Pt was educated on precautions, use of DME, transfers, and POC. Pt would benefit from skilled PT services to improve LE strength, gait, and safety with transfers in order to decrease burden of care. Evaluation revealed that SNF is most appropriate D/C plan at this time, pt verbally agreed.       Follow Up Recommendations SNF    Equipment Recommendations  Rolling walker with 5" wheels;3in1 (PT)    Recommendations for Other Services OT consult     Precautions / Restrictions Precautions Precautions: Fall;Posterior Hip Precaution Booklet Issued: Yes (comment) Required Braces or Orthoses: Knee Immobilizer - Right (no orders written) Restrictions RLE Weight Bearing: Weight bearing as tolerated      Mobility  Bed Mobility Overal bed mobility: Needs Assistance;+2 for physical assistance Bed Mobility: Supine to Sit     Supine to sit: Mod assist;+2 for physical assistance     General bed mobility comments: Mod assist to elevate trunk, move R LE, prevent IR of R LE, and pivot  Transfers Overall transfer level: Needs assistance   Transfers: Sit to/from Stand Sit to Stand: Mod assist;+2 physical assistance         General transfer comment: Pt unable to stand without mod assist for powering up. Required 2 trials to stand. Mod VC for hand placement and sequencing  Ambulation/Gait Ambulation/Gait assistance:  Min guard Ambulation Distance (Feet): 79 Feet Assistive device: Rolling walker (2 wheeled) Gait Pattern/deviations: Step-to pattern;Decreased stride length;Decreased step length - right   Gait velocity interpretation: Below normal speed for age/gender General Gait Details: Required Mod VC to avoid listing to her R. Min guard for steadiness and safety, chair to follow.   Stairs            Wheelchair Mobility    Modified Rankin (Stroke Patients Only)       Balance Overall balance assessment: Needs assistance Sitting-balance support: Bilateral upper extremity supported;Feet supported Sitting balance-Leahy Scale: Fair   Postural control: Left lateral lean (Voluntary to decrease R hip flexion) Standing balance support: Bilateral upper extremity supported Standing balance-Leahy Scale: Poor Standing balance comment: use of RW                             Pertinent Vitals/Pain Pain Assessment: 0-10 Pain Score: 4  Pain Location: R Hip Pain Descriptors / Indicators: Aching Pain Intervention(s): Monitored during session;Repositioned;Ice applied;Limited activity within patient's tolerance    Home Living Family/patient expects to be discharged to:: Private residence Living Arrangements: Spouse/significant other Available Help at Discharge: Available 24 hours/day Type of Home: House Home Access: Stairs to enter Entrance Stairs-Rails: Right Entrance Stairs-Number of Steps: 1 Home Layout: One level Home Equipment: Grab bars - tub/shower      Prior Function Level of Independence: Independent               Hand Dominance   Dominant Hand: Right    Extremity/Trunk Assessment   Upper Extremity Assessment: Defer to OT  evaluation           Lower Extremity Assessment: Generalized weakness      Cervical / Trunk Assessment: Kyphotic  Communication   Communication: No difficulties  Cognition Arousal/Alertness: Awake/alert Behavior During Therapy: WFL  for tasks assessed/performed Overall Cognitive Status: Within Functional Limits for tasks assessed       Memory: Decreased recall of precautions              General Comments      Exercises        Assessment/Plan    PT Assessment Patient needs continued PT services  PT Diagnosis Difficulty walking;Generalized weakness;Acute pain   PT Problem List Decreased strength;Decreased range of motion;Decreased activity tolerance;Decreased mobility;Decreased knowledge of use of DME;Decreased safety awareness;Decreased knowledge of precautions;Pain  PT Treatment Interventions DME instruction;Gait training;Stair training;Functional mobility training;Therapeutic exercise;Patient/family education   PT Goals (Current goals can be found in the Care Plan section) Acute Rehab PT Goals Patient Stated Goal: Return home PT Goal Formulation: With patient Time For Goal Achievement: 04/25/15 Potential to Achieve Goals: Fair    Frequency Min 3X/week   Barriers to discharge Decreased caregiver support 22 yo husband is only full time caregiver at home    Co-evaluation               End of Session Equipment Utilized During Treatment: Gait belt;Right knee immobilizer Activity Tolerance: Patient limited by fatigue Patient left: in chair;with call bell/phone within reach;with chair alarm set Nurse Communication: Mobility status;Precautions;Weight bearing status         Time:  -      Charges:         PT G CodesHaynes Bast 2015-05-18, 9:25 AM Haynes Bast, SPT 05-18-15 9:25 AM

## 2015-04-19 ENCOUNTER — Non-Acute Institutional Stay (SKILLED_NURSING_FACILITY): Payer: Medicare Other | Admitting: Internal Medicine

## 2015-04-19 ENCOUNTER — Telehealth: Payer: Self-pay | Admitting: Family Medicine

## 2015-04-19 DIAGNOSIS — I25118 Atherosclerotic heart disease of native coronary artery with other forms of angina pectoris: Secondary | ICD-10-CM

## 2015-04-19 DIAGNOSIS — E785 Hyperlipidemia, unspecified: Secondary | ICD-10-CM

## 2015-04-19 DIAGNOSIS — I5032 Chronic diastolic (congestive) heart failure: Secondary | ICD-10-CM | POA: Diagnosis not present

## 2015-04-19 DIAGNOSIS — C8440 Peripheral T-cell lymphoma, not classified, unspecified site: Secondary | ICD-10-CM

## 2015-04-19 DIAGNOSIS — S72001D Fracture of unspecified part of neck of right femur, subsequent encounter for closed fracture with routine healing: Secondary | ICD-10-CM | POA: Diagnosis not present

## 2015-04-19 DIAGNOSIS — I480 Paroxysmal atrial fibrillation: Secondary | ICD-10-CM

## 2015-04-19 DIAGNOSIS — E071 Dyshormogenetic goiter: Secondary | ICD-10-CM

## 2015-04-19 DIAGNOSIS — D62 Acute posthemorrhagic anemia: Secondary | ICD-10-CM

## 2015-04-19 DIAGNOSIS — I350 Nonrheumatic aortic (valve) stenosis: Secondary | ICD-10-CM

## 2015-04-19 DIAGNOSIS — Z7901 Long term (current) use of anticoagulants: Secondary | ICD-10-CM

## 2015-04-19 DIAGNOSIS — E039 Hypothyroidism, unspecified: Secondary | ICD-10-CM

## 2015-04-19 LAB — BASIC METABOLIC PANEL
Anion gap: 5 (ref 5–15)
BUN: 22 mg/dL — AB (ref 6–20)
CALCIUM: 8.3 mg/dL — AB (ref 8.9–10.3)
CO2: 25 mmol/L (ref 22–32)
CREATININE: 0.9 mg/dL (ref 0.44–1.00)
Chloride: 109 mmol/L (ref 101–111)
GFR calc non Af Amer: 55 mL/min — ABNORMAL LOW (ref >60)
Glucose, Bld: 122 mg/dL — ABNORMAL HIGH (ref 65–99)
Potassium: 3.6 mmol/L (ref 3.5–5.1)
SODIUM: 139 mmol/L (ref 135–145)

## 2015-04-19 LAB — CBC
HCT: 29.9 % — ABNORMAL LOW (ref 36.0–46.0)
Hemoglobin: 9.6 g/dL — ABNORMAL LOW (ref 12.0–15.0)
MCH: 30.3 pg (ref 26.0–34.0)
MCHC: 32.1 g/dL (ref 30.0–36.0)
MCV: 94.3 fL (ref 78.0–100.0)
Platelets: 166 10*3/uL (ref 150–400)
RBC: 3.17 MIL/uL — ABNORMAL LOW (ref 3.87–5.11)
RDW: 13.5 % (ref 11.5–15.5)
WBC: 12.5 10*3/uL — ABNORMAL HIGH (ref 4.0–10.5)

## 2015-04-19 LAB — PROTIME-INR
INR: 1.62 — AB (ref 0.00–1.49)
PROTHROMBIN TIME: 19.3 s — AB (ref 11.6–15.2)

## 2015-04-19 MED ORDER — WARFARIN SODIUM 3 MG PO TABS
3.0000 mg | ORAL_TABLET | Freq: Once | ORAL | Status: DC
  Filled 2015-04-19: qty 1

## 2015-04-19 MED ORDER — WARFARIN SODIUM 1 MG PO TABS: 3.0000 mg | ORAL_TABLET | Freq: Once | ORAL | Status: DC

## 2015-04-19 MED ORDER — HYDROCODONE-ACETAMINOPHEN 7.5-325 MG PO TABS: 1.0000 | ORAL_TABLET | Freq: Four times a day (QID) | ORAL | Status: DC | PRN

## 2015-04-19 MED ORDER — METOPROLOL TARTRATE 50 MG PO TABS
75.0000 mg | ORAL_TABLET | Freq: Two times a day (BID) | ORAL | Status: DC
  Administered 2015-04-19: 75 mg via ORAL
  Filled 2015-04-19: qty 1

## 2015-04-19 MED ORDER — WARFARIN SODIUM 1 MG PO TABS: ORAL_TABLET | ORAL | Status: DC

## 2015-04-19 MED ORDER — ENSURE ENLIVE PO LIQD: 237.0000 mL | Freq: Every day | ORAL | Status: DC

## 2015-04-19 MED ORDER — METOPROLOL TARTRATE 25 MG PO TABS: 50.0000 mg | ORAL_TABLET | Freq: Two times a day (BID) | ORAL | Status: DC

## 2015-04-19 MED ORDER — ACETAMINOPHEN 500 MG PO TABS: 1000.0000 mg | ORAL_TABLET | Freq: Three times a day (TID) | ORAL | Status: DC

## 2015-04-19 MED ORDER — ACETAMINOPHEN 500 MG PO TABS
1000.0000 mg | ORAL_TABLET | Freq: Three times a day (TID) | ORAL | Status: DC
  Administered 2015-04-19: 1000 mg via ORAL
  Filled 2015-04-19: qty 2

## 2015-04-19 MED ORDER — SENNOSIDES-DOCUSATE SODIUM 8.6-50 MG PO TABS: 2.0000 | ORAL_TABLET | Freq: Every day | ORAL | Status: DC

## 2015-04-19 NOTE — Discharge Summary (Addendum)
PATIENT DETAILS Name: [name] Age: 81 y.o. Sex: female Date of Birth: Dec 15, 1923 MRN: [id number]. Admitting Physician: Reubin Milan, MD [id number] Juanda Crumble, MD  Admit Date: 04/15/2015 Discharge date: 04/19/2015  Recommendations for Outpatient Follow-up:  1. Please ensure follow-up with orthopedics-Dr. Lorin Mercy. 2. Please repeat CBC/BMET in one week 3. Please check INR in 2 days-and adjust dosing of Coumadin accordingly 4. Has axillary lymphadenopathy-follow up with oncology as previously scheduled 5. On 11/2-CT Soft tissue showed-Diffuse enlargement of the thyroid compatible with goiter with Hyperenhancing nodule right mid lobe measuring 6 x 8 x 12 mm. Please ensure further work up-once discharged from SNF  PRIMARY DISCHARGE DIAGNOSIS:  Principal Problem:   Closed right hip fracture ([place]) Active Problems:   Chronic diastolic heart failure (H[facility])   Hyperlipidemia   HTN (hypertension)   Hypothyroidism   Coronary artery disease   Aortic valvular stenosis, moderate to severe   Long term current use of anticoagulant therapy   PAF (paroxysmal atrial fibrillation) ([place])      PAST MEDICAL HISTORY: Past Medical History  Diagnosis Date  . Chronic diastolic CHF (congestive heart failure) ([place])     a. 10/2012 Echo: EF 55-60%, no rwma, Gr 2 DD, moderate AS, mod dil LA, mildly to mod dil RA.  Marland Kitchen Hypothyroidism   . Dyslipidemia   . Hypertension   . PAF (paroxysmal atrial fibrillation) ([place])     a. CHA2DS2VASc = 7-->chronic coumadin;  b. s/p DCCV 05/2011, 05/2012, 07/2014;  c. On amio.  Marland Kitchen  Acute respiratiory failure requiring BiPap 06/24/2011  . Aortic valvular stenosis, moderate to severe 06/24/2011    a. 10/2012 Echo: mod AS, Valve 1.25 cm^2 (VTI), 1.19cm^2 (Vmax).  . Allergy   . Coronary artery disease     a. 03/2009 PCI RCA (3.0x18 BMS).  . Exertional shortness of breath   . Arthritis     "back" (10/30/2012)  . Rash     a. felt to be 2/2 lasix/torsemide in setting of  sulfa allergy (lasix d/c ~ 06/2014, torsemide d/c 08/09/2014).    DISCHARGE MEDICATIONS: Current Discharge Medication List    START taking these medications   Details  feeding supplement, ENSURE ENLIVE, (ENSURE ENLIVE) LIQD Take 237 mLs by mouth daily.    HYDROcodone-acetaminophen (NORCO) 7.5-325 MG tablet Take 1 tablet by mouth every 6 (six) hours as needed (breakthrough pain). Qty: 60 tablet, Refills: 0    senna-docusate (SENOKOT-S) 8.6-50 MG tablet Take 2 tablets by mouth at bedtime.      CONTINUE these medications which have CHANGED   Details  metoprolol tartrate (LOPRESSOR) 25 MG tablet Take 2 tablets (50 mg total) by mouth 2 (two) times daily.    !! warfarin (COUMADIN) 1 MG tablet Take 2 tablets by mouth daily on 11/24-but on 11/23 take 3 mg coumdain for one dose only    !! warfarin (COUMADIN) 1 MG tablet Take 3 tablets (3 mg total) by mouth one time only at 6 PM. On 11/23 only     !! - Potential duplicate medications found. Please discuss with provider.    CONTINUE these medications which have NOT CHANGED   Details  acetaminophen (TYLENOL) 650 MG CR tablet Take 650 mg by mouth 3 (three) times daily. Morning, supper and bedtime    albuterol (PROVENTIL HFA;VENTOLIN HFA) 108 (90 BASE) MCG/ACT inhaler Inhale 2 puffs into the lungs every 6 (six) hours as needed for wheezing or shortness of breath.    atorvastatin (LIPITOR) 40 MG tablet TAKE 1 TABLET DAILY  Qty: 90 tablet, Refills: 3    fluticasone (FLONASE) 50 MCG/ACT nasal spray Place 2 sprays into both nostrils daily. Use as directed    folic acid (FOLVITE) 1 MG tablet Take 2 tablets (2 mg total) by mouth daily. Qty: 180 tablet, Refills: 3    ipratropium (ATROVENT) 0.03 % nasal spray Place 2 sprays into both nostrils 2 (two) times daily. Use as directed    levothyroxine (SYNTHROID, LEVOTHROID) 75 MCG tablet Take 1 tablet (75 mcg total) by mouth daily before breakfast. Qty: 30 tablet, Refills: 2    loratadine (CLARITIN)  10 MG tablet Take 10 mg by mouth daily with breakfast.     Multiple Vitamin (MULTIVITAMIN WITH MINERALS) TABS tablet Take 1 tablet by mouth daily. Centrum Silver    Polyvinyl Alcohol-Povidone (TEARS PLUS OP) Place 1 drop into both eyes at bedtime.     spironolactone (ALDACTONE) 25 MG tablet Take 1 tablet (25 mg total) by mouth daily. Qty: 90 tablet, Refills: 1    triamcinolone cream (KENALOG) 0.1 % Apply 1 application topically daily as needed (rash).         ALLERGIES:   Allergies  Allergen Reactions  . [name] [Ethacrynic Acid] Nausea And Vomiting  . Tape Other (See Comments)    Tears skin off.  Please use "paper" tape only.  . Furosemide Rash  . Sulfa Antibiotics Itching and Rash  . [name] Rash    Per Dr Sallyanne Kuster, had a skin reaction with thiazides prior to use of Lasix  . Torsemide Rash    BRIEF HPI:  See H&P, Labs, Consult and Test reports for all details in brief, patient IS A 81 YO -year-old Caucasian female with a past medical history of chronic diastolic CHF, hypothyroidism, hyperlipidemia, paroxysmal atrial fibrillation, coronary artery disease, presented after a mechanical fall at home resulting in fracture of the right hip. She was hospitalized for further management.   CONSULTATIONS:   orthopedic surgery  PERTINENT RADIOLOGIC STUDIES: Dg Chest 1 View  04/15/2015  CLINICAL DATA:  Patient with hip pain status post fall. No chest pain. EXAM: CHEST 1 VIEW COMPARISON:  Chest radiograph 02/07/2015 FINDINGS: Stable cardiomegaly. Unchanged bilateral coarse interstitial pulmonary opacities. Stable biapical pleural parenchymal thickening. No large area of pulmonary consolidation. Stable blunting of the costophrenic angles bilaterally. Left shoulder arthroplasty. Mid thoracic spine degenerative changes. IMPRESSION: No acute cardiopulmonary process. Stable coarse interstitial opacities. Electronically Signed   By: Lovey Newcomer M.D.   On: 04/15/2015 19:03   Ct  Soft Tissue Neck W Contrast  03/29/2015  CLINICAL DATA:  Axillary lymphadenopathy, suspicious for lymphoma EXAM: CT NECK WITH CONTRAST TECHNIQUE: Multidetector CT imaging of the neck was performed using the standard protocol following the bolus administration of intravenous contrast. CONTRAST:  112m OMNIPAQUE IOHEXOL 300 MG/ML  SOLN COMPARISON:  CT chest and abdomen from today FINDINGS: Pharynx and larynx: Normal pharynx and larynx.  No mass or edema. Salivary glands: Parotid and submandibular glands are normal bilaterally. No mass lesion. Thyroid: Thyroid is diffusely enlarged bilaterally with lobular contour compatible with goiter. Hyper enhancing nodule right mid lobe measures 6 x 8 x 12 mm. No other nodules identified. Lymph nodes: No pathologic adenopathy in the neck. 6 mm node in the right neck at the level the hyoid bone. Axillary adenopathy described on the CT chest from today. Vascular: Carotid artery calcification bilaterally. Carotid artery and jugular vein patent bilaterally. Limited intracranial: Negative Visualized orbits: Not imaged Mastoids and visualized paranasal sinuses: Negative Skeleton: Cervical spondylosis most notable  at C5-6 and C6-7. Diffuse facet degeneration. No fracture or mass lesion. IMPRESSION: Negative for cervical adenopathy. Diffuse enlargement of the thyroid compatible with goiter. Hyper enhancing nodule right mid lobe measuring 6 x 8 x 12 mm. This could be further evaluated with thyroid ultrasound, if clinically indicated. Electronically Signed   By: Franchot Gallo M.D.   On: 03/29/2015 11:10   Ct Chest W Contrast  03/29/2015  CLINICAL DATA:  Subsequent encounter for axillary lymphadenopathy. EXAM: CT CHEST, ABDOMEN, AND PELVIS WITH CONTRAST TECHNIQUE: Multidetector CT imaging of the chest, abdomen and pelvis was performed following the standard protocol during bolus administration of intravenous contrast. CONTRAST:  17m OMNIPAQUE IOHEXOL 300 MG/ML  SOLN COMPARISON:  None.  FINDINGS: CT CHEST FINDINGS Mediastinum/Lymph Nodes: Symmetric mild bilateral axillary lymphadenopathy is evident. Index right axillary lymph node measures 12 mm in short axis on image 11 series 3. Index left axillary lymph node measures 11 mm in short axis on image 17 series 3. No mediastinal lymphadenopathy. There is no hilar lymphadenopathy. The heart size is normal. No pericardial effusion. Coronary artery calcification is noted. Calcification is seen at the aortic valve and mitral valve. Bilateral thyroid enlargement noted without discrete nodule evident although thyroid gland incompletely visualized. Lungs/Pleura: Biapical pleural-parenchymal scarring is evident. 4 mm right lung nodule seen on image 22 series 5. Scattered tiny bilateral pulmonary nodules are in the 2-3 mm size range with a slight lower lobe predominance. No focal airspace consolidation. No pulmonary edema. Small left pleural effusion noted Musculoskeletal: Bone windows reveal no worrisome lytic or sclerotic osseous lesions. CT ABDOMEN PELVIS FINDINGS Hepatobiliary: No focal abnormality within the liver parenchyma. 12 mm stone identified in the neck of gallbladder. No intrahepatic or extrahepatic biliary dilation. Pancreas: No focal mass lesion. No dilatation of the main duct. No intraparenchymal cyst. No peripancreatic edema. Spleen: No splenomegaly. No focal mass lesion. Adrenals/Urinary Tract: No adrenal nodule or mass. 9 mm exophytic low-density lesion in the upper pole left kidney is likely a cyst. No evidence for enhancing renal mass. No hydronephrosis. No evidence for hydroureter. The urinary bladder appears normal for the degree of distention. Stomach/Bowel: Stomach is nondistended. No gastric wall thickening. No evidence of outlet obstruction. Duodenum is normally positioned as is the ligament of Treitz. No small bowel wall thickening. No small bowel dilatation. The terminal ileum is normal. No gross colonic mass. No colonic wall  thickening. No substantial diverticular change. Vascular/Lymphatic: There is abdominal aortic atherosclerosis without aneurysm. There is no gastrohepatic or hepatoduodenal ligament lymphadenopathy. No intraperitoneal or retroperitoneal lymphadenopy. No evidence for pelvic sidewall lymphadenopathy. Borderline enlarged groin lymph nodes are seen bilaterally. Index lymph node in the right groin measures 13 mm short axis on image 106 series 3. Index lymph node in the left groin measures 13 mm in short axis (image 108 series 3). Reproductive: The uterus has normal CT imaging appearance. Left ovary is unremarkable. 16 mm cystic lesion in the right ovary is stable since a CT exam of the right hip dated 03/19/2012. Other: No intraperitoneal free fluid. Musculoskeletal: Old right inferior pubic ramus fracture noted. IMPRESSION: 1. Borderline bilateral axillary and groin lymphadenopathy. 2. No evidence for mediastinal or hilar lymphadenopathy. No mesenteric or retroperitoneal lymphadenopathy in the abdomen or pelvis. 3. Cholelithiasis. 4. Abdominal aortic atherosclerosis. Electronically Signed   By: EMisty StanleyM.D.   On: 03/29/2015 10:17   Ct Abdomen Pelvis W Contrast  03/29/2015  CLINICAL DATA:  Subsequent encounter for axillary lymphadenopathy. EXAM: CT CHEST, ABDOMEN, AND PELVIS WITH  CONTRAST TECHNIQUE: Multidetector CT imaging of the chest, abdomen and pelvis was performed following the standard protocol during bolus administration of intravenous contrast. CONTRAST:  169m OMNIPAQUE IOHEXOL 300 MG/ML  SOLN COMPARISON:  None. FINDINGS: CT CHEST FINDINGS Mediastinum/Lymph Nodes: Symmetric mild bilateral axillary lymphadenopathy is evident. Index right axillary lymph node measures 12 mm in short axis on image 11 series 3. Index left axillary lymph node measures 11 mm in short axis on image 17 series 3. No mediastinal lymphadenopathy. There is no hilar lymphadenopathy. The heart size is normal. No pericardial effusion.  Coronary artery calcification is noted. Calcification is seen at the aortic valve and mitral valve. Bilateral thyroid enlargement noted without discrete nodule evident although thyroid gland incompletely visualized. Lungs/Pleura: Biapical pleural-parenchymal scarring is evident. 4 mm right lung nodule seen on image 22 series 5. Scattered tiny bilateral pulmonary nodules are in the 2-3 mm size range with a slight lower lobe predominance. No focal airspace consolidation. No pulmonary edema. Small left pleural effusion noted Musculoskeletal: Bone windows reveal no worrisome lytic or sclerotic osseous lesions. CT ABDOMEN PELVIS FINDINGS Hepatobiliary: No focal abnormality within the liver parenchyma. 12 mm stone identified in the neck of gallbladder. No intrahepatic or extrahepatic biliary dilation. Pancreas: No focal mass lesion. No dilatation of the main duct. No intraparenchymal cyst. No peripancreatic edema. Spleen: No splenomegaly. No focal mass lesion. Adrenals/Urinary Tract: No adrenal nodule or mass. 9 mm exophytic low-density lesion in the upper pole left kidney is likely a cyst. No evidence for enhancing renal mass. No hydronephrosis. No evidence for hydroureter. The urinary bladder appears normal for the degree of distention. Stomach/Bowel: Stomach is nondistended. No gastric wall thickening. No evidence of outlet obstruction. Duodenum is normally positioned as is the ligament of Treitz. No small bowel wall thickening. No small bowel dilatation. The terminal ileum is normal. No gross colonic mass. No colonic wall thickening. No substantial diverticular change. Vascular/Lymphatic: There is abdominal aortic atherosclerosis without aneurysm. There is no gastrohepatic or hepatoduodenal ligament lymphadenopathy. No intraperitoneal or retroperitoneal lymphadenopy. No evidence for pelvic sidewall lymphadenopathy. Borderline enlarged groin lymph nodes are seen bilaterally. Index lymph node in the right groin measures  13 mm short axis on image 106 series 3. Index lymph node in the left groin measures 13 mm in short axis (image 108 series 3). Reproductive: The uterus has normal CT imaging appearance. Left ovary is unremarkable. 16 mm cystic lesion in the right ovary is stable since a CT exam of the right hip dated 03/19/2012. Other: No intraperitoneal free fluid. Musculoskeletal: Old right inferior pubic ramus fracture noted. IMPRESSION: 1. Borderline bilateral axillary and groin lymphadenopathy. 2. No evidence for mediastinal or hilar lymphadenopathy. No mesenteric or retroperitoneal lymphadenopathy in the abdomen or pelvis. 3. Cholelithiasis. 4. Abdominal aortic atherosclerosis. Electronically Signed   By: EMisty StanleyM.D.   On: 03/29/2015 10:17   Dg Hip Unilat With Pelvis 2-3 Views Right  04/15/2015  CLINICAL DATA:  Lateral right hip pain status post fall. EXAM: DG HIP (WITH OR WITHOUT PELVIS) 2-3V RIGHT COMPARISON:  None. FINDINGS: There is generalized osteopenia. There is a mildly displaced right femoral neck fracture. There is no dislocation. There is no other fracture or dislocation. There is peripheral vascular atherosclerotic disease. IMPRESSION: Mildly displaced right femoral neck fracture. Electronically Signed   By: HKathreen Devoid  On: 04/15/2015 19:01   Dg Femur 1v Right  04/15/2015  CLINICAL DATA:  Fall in garage today. Right hip pain. Initial encounter. EXAM: RIGHT FEMUR 1 VIEW  COMPARISON:  None. FINDINGS: Mildly displaced right femoral neck fracture is seen. No evidence hip dislocation. No evidence of distal femur fracture. Generalized osteopenia noted. Old fracture deformity of right inferior pubic ramus noted. Peripheral vascular calcification also demonstrated. IMPRESSION: Acute right femoral neck fracture. Electronically Signed   By: Earle Gell M.D.   On: 04/15/2015 19:01     PERTINENT LAB RESULTS: CBC:  Recent Labs  04/18/15 0540 04/19/15 0540  WBC 11.3* 12.5*  HGB 10.0* 9.6*  HCT 30.8*  29.9*  PLT 160 166   CMET CMP     Component Value Date/Time   NA 139 04/19/2015 0540   NA 144 03/22/2015 1621   K 3.6 04/19/2015 0540   K 4.4 03/22/2015 1621   CL 109 04/19/2015 0540   CO2 25 04/19/2015 0540   CO2 26 03/22/2015 1621   GLUCOSE 122* 04/19/2015 0540   GLUCOSE 95 03/22/2015 1621   BUN 22* 04/19/2015 0540   BUN 15.0 03/22/2015 1621   CREATININE 0.90 04/19/2015 0540   CREATININE 1.0 03/22/2015 1621   CREATININE 1.07* 01/31/2015 0853   CALCIUM 8.3* 04/19/2015 0540   CALCIUM 9.5 03/22/2015 1621   PROT 6.5 04/16/2015 0200   PROT 7.4 03/22/2015 1621   ALBUMIN 3.3* 04/16/2015 0200   ALBUMIN 4.1 03/22/2015 1621   AST 21 04/16/2015 0200   AST 20 03/22/2015 1621   ALT 16 04/16/2015 0200   ALT 17 03/22/2015 1621   ALKPHOS 72 04/16/2015 0200   ALKPHOS 98 03/22/2015 1621   BILITOT 0.9 04/16/2015 0200   BILITOT 0.79 03/22/2015 1621   GFRNONAA 55* 04/19/2015 0540   GFRNONAA 46* 01/31/2015 0853   GFRAA >60 04/19/2015 0540   GFRAA 53* 01/31/2015 0853    GFR Estimated Creatinine Clearance: 35.1 mL/min (by C-G formula based on Cr of 0.9). No results for input(s): LIPASE, AMYLASE in the last 72 hours. No results for input(s): CKTOTAL, CKMB, CKMBINDEX, TROPONINI in the last 72 hours. Invalid input(s): POCBNP No results for input(s): DDIMER in the last 72 hours. No results for input(s): HGBA1C in the last 72 hours. No results for input(s): CHOL, HDL, LDLCALC, TRIG, CHOLHDL, LDLDIRECT in the last 72 hours. No results for input(s): TSH, T4TOTAL, T3FREE, THYROIDAB in the last 72 hours.  Invalid input(s): FREET3 No results for input(s): VITAMINB12, FOLATE, FERRITIN, TIBC, IRON, RETICCTPCT in the last 72 hours. Coags:  Recent Labs  04/18/15 0540 04/19/15 0540  INR 1.44 1.62*   Microbiology: Recent Results (from the past 240 hour(s))  Culture, Urine     Status: None   Collection Time: 04/16/15 12:33 AM  Result Value Ref Range Status   Specimen Description URINE,  CATHETERIZED  Final   Special Requests NONE  Final   Culture NO GROWTH 1 DAY  Final   Report Status 04/17/2015 FINAL  Final  Surgical pcr screen     Status: None   Collection Time: 04/16/15 12:33 AM  Result Value Ref Range Status   MRSA, PCR NEGATIVE NEGATIVE Final   Staphylococcus aureus NEGATIVE NEGATIVE Final    Comment:        The Xpert SA Assay (FDA approved for NASAL specimens in patients over 54 years of age), is one component of a comprehensive surveillance program.  Test performance has been validated by [facility] for patients greater than or equal to 84 year old. It is not intended to diagnose infection nor to guide or monitor treatment.      BRIEF HOSPITAL COURSE:  Closed right hip  fracture: This was following a mechanical fall, patient was seen by orthopedics and is now status post right hip replacement for hip fracture.This appears to be medically stable postoperatively. PT evaluation completed-recommendations of SNF. Please ensure follow-up with orthopedics.  PAF (paroxysmal atrial fibrillation): Heart rate initially not optimally controlled-suspect mostly from pain response-but heart rate was in the 80s this morning. Metoprolol dosage has been increased to 50 mg twice a day-this will be continued-Coumadin initially held on admission-has been restarted-please check INR in the next 2 days. Patient will get 3 mg Coumadin today, and on 11/24 when resume usual home dosage of 2 mg. Please ensure patient follows up with her primary cardiologist.   Chronic diastolic heart failure:Currently well compensated and euvolemic. Continue spironolactone. Monitor volume status closely.  Coronary artery disease:Status post bare metal stent to the RCA in 2010. Currently stable. EKG does not show any ischemic changes. Patient denies any chest pain or shortness of breath.  Aortic valvular stenosis, moderate :Appears to be asymptomatic. Continue to monitor.   Hyperlipidemia:Continue  atorvastatin.  Essential HTN (hypertension):Continue metoprolol and spironolactone. Monitor blood pressure closely  Hypothyroidism:Continue levothyroxine.   Leukocytosis:Most likely due to acute stress. Improved. No evidence for infection. Continue to monitor. She is afebrile.  Axillary lymphadenopathy: Follows with oncology-Dr Charolette Forward further workup in the outpatient setting  Diffuse enlargement of the thyroid compatible with goiter with Hyperenhancing nodule right mid lobe measuring 6 x 8 x 12 mm: Seen on CT chest prior to this admission on 03/29/15-further workup deferred to the outpatient setting.   TODAY-DAY OF DISCHARGE:  Subjective:   [name] today has no headache,no chest abdominal pain,no new weakness tingling or numbness, feels much better wants to go home today.   Objective:   Blood pressure 104/59, pulse 124, temperature 98.5 F (36.9 C), temperature source Oral, resp. rate 19, height '5\' 3"'$  (1.6 m), weight 59.42 kg (131 lb), SpO2 93 %.  Intake/Output Summary (Last 24 hours) at 04/19/15 1031 Last data filed at 04/18/15 2220  Gross per 24 hour  Intake    480 ml  Output    350 ml  Net    130 ml   Filed Weights   04/15/15 1625 04/15/15 2125  Weight: 59.421 kg (131 lb) 59.42 kg (131 lb)    Exam Awake Alert, Oriented *3, No new F.N deficits, Normal affect [place].AT,PERRAL Supple Neck,No JVD, No cervical lymphadenopathy appriciated.  Symmetrical Chest wall movement, Good air movement bilaterally, CTAB RRR,No Gallops,Rubs or new Murmurs, No Parasternal Heave +ve B.Sounds, Abd Soft, Non tender, No organomegaly appriciated, No rebound -guarding or rigidity. No Cyanosis, Clubbing or edema, No new Rash or bruise  DISCHARGE CONDITION: Stable  DISPOSITION: SNF  DISCHARGE INSTRUCTIONS:    Activity:  As tolerated with Full fall precautions use walker/cane & assistance as needed  Get Medicines reviewed and adjusted: Please take all your medications with you for  your next visit with your Primary MD  Please request your Primary MD to go over all hospital tests and procedure/radiological results at the follow up, please ask your Primary MD to get all Hospital records sent to his/her office.  If you experience worsening of your admission symptoms, develop shortness of breath, life threatening emergency, suicidal or homicidal thoughts you must seek medical attention immediately by calling 911 or calling your MD immediately  if symptoms less severe.  You must read complete instructions/literature along with all the possible adverse reactions/side effects for all the Medicines you take and that have been prescribed  to you. Take any new Medicines after you have completely understood and accpet all the possible adverse reactions/side effects.   Do not drive when taking Pain medications.   Do not take more than prescribed Pain, Sleep and Anxiety Medications  Special Instructions: If you have smoked or chewed Tobacco  in the last 2 yrs please stop smoking, stop any regular Alcohol  and or any Recreational drug use.  Wear Seat belts while driving.  Please note  You were cared for by a hospitalist during your hospital stay. Once you are discharged, your primary care physician will handle any further medical issues. Please note that NO REFILLS for any discharge medications will be authorized once you are discharged, as it is imperative that you return to your primary care physician (or establish a relationship with a primary care physician if you do not have one) for your aftercare needs so that they can reassess your need for medications and monitor your lab values.   Diet recommendation: Heart Healthy diet  Discharge Instructions    Call MD for:  redness, tenderness, or signs of infection (pain, swelling, redness, odor or green/yellow discharge around incision site)    Complete by:  As directed      Diet - low sodium heart healthy    Complete by:  As  directed      Increase activity slowly    Complete by:  As directed   WBAT           Follow-up Information    Schedule an appointment as soon as possible for a visit with Marybelle Killings, MD.   Specialty:  Orthopedic Surgery   Why:  needs return office visit 2 weeks postop   Contact information:   [place] [address] [phone]       Follow up with Wyatt Haste, MD. Schedule an appointment as soon as possible for a visit in 2 weeks.   Specialty:  Family Medicine   Why:  Hospital follow up   Contact information:   [place] [address] [phone]      Total Time spent on discharge equals 45 minutes.  SignedOren Binet 04/19/2015 10:31 AM

## 2015-04-19 NOTE — Progress Notes (Signed)
Subjective: Doing well. Pain controlled.  Awaiting d/c planning. Patient states that family members have given her the name of a couple of SNF's.     Objective: Vital signs in last 24 hours: Temp:  [98.1 F (36.7 C)-98.5 F (36.9 C)] 98.5 F (36.9 C) (11/23 0615) Pulse Rate:  [109-124] 124 (11/23 0615) Resp:  [19-20] 19 (11/23 0615) BP: (99-109)/(59-66) 104/59 mmHg (11/23 0615) SpO2:  [93 %-95 %] 93 % (11/23 0615)  Intake/Output from previous day: 11/22 0701 - 11/23 0700 In: 480 [P.O.:480] Out: 350 [Urine:350] Intake/Output this shift:     Recent Labs  04/17/15 0308 04/18/15 0540 04/19/15 0540  HGB 11.1* 10.0* 9.6*    Recent Labs  04/18/15 0540 04/19/15 0540  WBC 11.3* 12.5*  RBC 3.23* 3.17*  HCT 30.8* 29.9*  PLT 160 166    Recent Labs  04/18/15 0540 04/19/15 0540  NA 138 139  K 3.8 3.6  CL 107 109  CO2 24 25  BUN 16 22*  CREATININE 0.93 0.90  GLUCOSE 106* 122*  CALCIUM 8.1* 8.3*    Recent Labs  04/18/15 0540 04/19/15 0540  INR 1.44 1.62*    Exam:  Alert and oriented.  Hip no signs of infection.  bilat calves nontender.  Nvi.   Assessment/Plan: Transfer to snf when bed available and cleared by medicine. She is currently on coumadin for DVT prophylaxis.  norco script on chart for pain.  Will need rov with [facility] 2 weeks postop.     Amram Maya M 04/19/2015, 9:23 AM

## 2015-04-19 NOTE — Discharge Planning (Signed)
Patient to be discharged to [facility]. Patient and patient's family updated regarding discharge.  Facility: [facility] RN report number: [phone] Transportation: [place], [place] [facility]: [phone] Surgical: [phone]

## 2015-04-19 NOTE — Telephone Encounter (Signed)
Called pt for transition of care and spoke with husband and he states pt is still in the hospital and should be released today but she will be going to a nursing home. Advised to call us is she needs anything.

## 2015-04-19 NOTE — Clinical Social Work Placement (Signed)
   CLINICAL SOCIAL WORK PLACEMENT  NOTE  Date:  04/19/2015  Patient Details  Name: [name] MRN: [id number] Date of Birth: [birth year]  Clinical Social Work is seeking post-discharge placement for this patient at the [place] level of care (*CSW will initial, date and re-position this form in  chart as items are completed):  Yes   Patient/family provided with [place] Work Department's list of facilities offering this level of care within the geographic area requested by the patient (or if unable, by the patient's family).  Yes   Patient/family informed of their freedom to choose among providers that offer the needed level of care, that participate in Medicare, Medicaid or managed care program needed by the patient, have an available bed and are willing to accept the patient.  Yes   Patient/family informed of [facility]'s ownership interest in [facility] and [facility], as well as of the fact that they are under no obligation to receive care at these facilities.  PASRR submitted to EDS on 04/18/15     PASRR number received on 04/18/15     Existing PASRR number confirmed on       FL2 transmitted to all facilities in geographic area requested by pt/family on 04/18/15     FL2 transmitted to all facilities within larger geographic area on       Patient informed that his/her managed care company has contracts with or will negotiate with certain facilities, including the following:        Yes   Patient/family informed of bed offers received.  Patient chooses bed at [facility]     Physician recommends and patient chooses bed at      Patient to be transferred to [facility] on 04/19/15.  Patient to be transferred to facility by [facility]     Patient family notified on 04/19/15 of transfer.  Name of family member notified:  Patient and patient's famiily at bedside.     PHYSICIAN       Additional  Comment:    _______________________________________________ Caroline Sauger, LCSW 04/19/2015, 11:55 AM

## 2015-04-19 NOTE — Progress Notes (Signed)
Pt stable and ready for discharge. Report called to Monat--RN at [facility]. All questions and concerns answered. Pt will be transported out via [facility]. Will continue to monitor.

## 2015-04-19 NOTE — Progress Notes (Signed)
[place] for warfarin Indication: atrial fibrillation  Allergies  Allergen Reactions  . [name] [Ethacrynic Acid] Nausea And Vomiting  . Tape Other (See Comments)    Tears skin off.  Please use "paper" tape only.  . Furosemide Rash  . Sulfa Antibiotics Itching and Rash  . [name] Rash    Per Dr Sallyanne Kuster, had a skin reaction with thiazides prior to use of Lasix  . Torsemide Rash    Patient Measurements: Height: '5\' 3"'$  (160 cm) Weight: 131 lb (59.42 kg) IBW/kg (Calculated) : 52.4  Vital Signs: Temp: 98.5 F (36.9 C) (11/23 0615) BP: 104/59 mmHg (11/23 0615) Pulse Rate: 124 (11/23 0615)  Labs:  Recent Labs  04/17/15 0308 04/18/15 0540 04/19/15 0540  HGB 11.1* 10.0* 9.6*  HCT 34.4* 30.8* 29.9*  PLT 173 160 166  LABPROT 19.4* 17.6* 19.3*  INR 1.63* 1.44 1.62*  CREATININE 0.87 0.93 0.90    Estimated Creatinine Clearance: 35.1 mL/min (by C-G formula based on Cr of 0.9).  Assessment: 81 yo female admitted with closed right hip fx now post-op hip hemiarthroplasty. Coumadin resumed 11/21. INR remains low at 1.62 but trending up steadily. H/H slightly low and platelets are WNL. No overt bleeding noted. Pt is discharging today.   Goal of Therapy:  INR 2-3 Monitor platelets by anticoagulation protocol: Yes   Plan:  - Repeat Warfarin '3mg'$  PO x 1 tonight then resume home dose of '2mg'$  daily at discharge starting tomorrow - Daily INR while admitted  Salome Arnt, PharmD, BCPS Pager # [phone] 04/19/2015 2:21 PM

## 2015-04-19 NOTE — Progress Notes (Signed)
Physical Therapy Treatment Patient Details Name: [name] MRN: [id number] DOB: [birth year] Today's Date: 04/19/2015    History of Present Illness Pt admitted after fall at home with Right femur fx s/p right hemiarthroplasty ,past medical history of chronic diastolic CHF, hypothyroidism, hyperlipidemia, hypertension, paroxysmal atrial fibrillation, CAD, osteoarthritis    PT Comments    The pt was very pleasant upon entry and was able to walk in hallway and review HEP.  Pt.'s husband and family member were present and discussing pt.'s D/C destination.  Pt able to perform tasks with verbal cues but seems a little unaware of posterior hip precautions.  Pt would benefit from use of reacher/sock-helper when at SNF.  Plan for D/C this afternoon.    Follow Up Recommendations  SNF     Equipment Recommendations  Rolling walker with 5" wheels;3in1 (PT)       Precautions / Restrictions Precautions Precautions: Fall;Posterior Hip Precaution Booklet Issued: Yes (comment) Precaution Comments: reviwed posterior hip precautions Required Braces or Orthoses: Knee Immobilizer - Right (no orders written) Restrictions Weight Bearing Restrictions: Yes RLE Weight Bearing: Weight bearing as tolerated    Mobility  Bed Mobility Overal bed mobility: Needs Assistance Bed Mobility: Supine to Sit     Supine to sit: Min guard     General bed mobility comments: Min guard to bring RLE to EOB.  Pt. able to use bed rails to bring trunk to sitting at EOB.  Verbal and tactile cues for hand placement and technique.  Transfers Overall transfer level: Needs assistance Equipment used: Rolling walker (2 wheeled) Transfers: Sit to/from Stand Sit to Stand: Min guard         General transfer comment: Pt. able to stand with min guard assist for safety and balance once standing.  Verbal and tactile cues for correct hand placement as pt. had both hands on walker when pulling to stand.  Pt. able to stand on  first try.   Ambulation/Gait Ambulation/Gait assistance: Min guard Ambulation Distance (Feet): 100 Feet Assistive device: Rolling walker (2 wheeled) Gait Pattern/deviations: Step-to pattern;Decreased stride length     General Gait Details: Pt. continues to slightly list to R but is able to correct with minimal verbal cues.  Min guard for safety and control through ambulation.  Pt. performs correct gait sequence when walking and uses RW safely.       Balance Overall balance assessment: Needs assistance Sitting-balance support: Bilateral upper extremity supported;Feet supported Sitting balance-Leahy Scale: Fair     Standing balance support: Bilateral upper extremity supported Standing balance-Leahy Scale: Fair Standing balance comment: RW for support                    Cognition Arousal/Alertness: Awake/alert Behavior During Therapy: WFL for tasks assessed/performed Overall Cognitive Status: Within Functional Limits for tasks assessed       Memory: Decreased recall of precautions              Exercises Total Joint Exercises Ankle Circles/Pumps: AROM;Both;20 reps;Supine Quad Sets: AAROM;Right;10 reps;Supine Hip ABduction/ADduction: AAROM;Right;10 reps;Supine;AROM;Standing Standing Hip Extension: AROM;Right;10 reps;Standing    General Comments General comments (skin integrity, edema, etc.): Pt. listens well and responds to cues without needed increased time.  Unsure of pt.'s understanding of importance of hip precautions, but pt. seemed to follow them throughout treatment.       Pertinent Vitals/Pain Pain Assessment: 0-10 Pain Score: 3  Pain Location: R hip Pain Descriptors / Indicators: Aching;Sore Pain Intervention(s): Limited activity within patient's tolerance;Monitored during  session;Repositioned;Ice applied           PT Goals (current goals can now be found in the care plan section) Acute Rehab PT Goals Patient Stated Goal: Return home Progress  towards PT goals: Progressing toward goals    Frequency  Min 3X/week    PT Plan Current plan remains appropriate       End of Session Equipment Utilized During Treatment: Gait belt;Right knee immobilizer Activity Tolerance: Patient tolerated treatment well Patient left: in chair;with call bell/phone within reach;with chair alarm set;with family/visitor present     Time: 1586-8257 PT Time Calculation (min) (ACUTE ONLY): 25 min  Charges: 1 Gait, [place], [place] - Office   04/19/2015, 12:51 PM

## 2015-04-20 ENCOUNTER — Encounter: Payer: Self-pay | Admitting: Internal Medicine

## 2015-04-20 DIAGNOSIS — D62 Acute posthemorrhagic anemia: Secondary | ICD-10-CM | POA: Insufficient documentation

## 2015-04-20 NOTE — Assessment & Plan Note (Signed)
:   Follows with oncology-Dr Charolette Forward further workup in the outpatient setting

## 2015-04-20 NOTE — Assessment & Plan Note (Signed)
Appears to be asymptomatic. Continue to monitor.

## 2015-04-20 NOTE — Assessment & Plan Note (Signed)
SNF - d/c Hb 9.6; will check CBC to follow

## 2015-04-20 NOTE — Assessment & Plan Note (Signed)
Currently well compensated and euvolemic. SNF - Continue spironolactone. And lopressor

## 2015-04-20 NOTE — Assessment & Plan Note (Signed)
SNF - not sure BP is accurate , will retake, pt completely asymptomatic sitting up in bed; conr spironolactone and lopressor

## 2015-04-20 NOTE — Progress Notes (Signed)
MRN: [id number] Name: [name]  Sex: female Age: 81 y.o. DOB: [birth year]  [place] #: [facility] Facility/Room:505 Level Of Care: SNF Provider: Inocencio Homes D Emergency Contacts: Extended Emergency Contact Information Primary Emergency Contact: Clarisa Schools Address: [address] [place], [place] [address] [place] Phone: [phone] Mobile Phone: [phone] Relation: Spouse Secondary Emergency Contact: Dellis Anes          [place], [place] [address] [place] Mobile Phone: [phone] Relation: Son  Code Status:   Allergies: [name]; Tape; Furosemide; Sulfa antibiotics; [name]; and Torsemide  Chief Complaint  Patient presents with  . New Admit To SNF    HPI: Patient is 81 y.o. female whopast medical history of chronic diastolic CHF, hypothyroidism, hyperlipidemia, paroxysmal atrial fibrillation, coronary artery disease, presented after a mechanical fall at home resulting in fracture of the right hip. She was hospitalized at [facility] from 11/19-23 for further management. During her stay it was noted that pt was in mid work-up for a thyroid goiter and axillary adenopathy followed by oncology. These will be f/u s/p d/c fro SNF. Pt is admitted to SNF for OT/PT. While at SNF pt will be followed for CHF, tx with metoprolol and spironolactone, HTN, tx with same and for HLD , tx with lipitor.  Past Medical History  Diagnosis Date  . Chronic diastolic CHF (congestive heart failure) ([place])     a. 10/2012 Echo: EF 55-60%, no rwma, Gr 2 DD, moderate AS, mod dil LA, mildly to mod dil RA.  Marland Kitchen Hypothyroidism   . Dyslipidemia   . Hypertension   . PAF (paroxysmal atrial fibrillation) ([place])     a. CHA2DS2VASc = 7-->chronic coumadin;  b. s/p DCCV 05/2011, 05/2012, 07/2014;  c. On amio.  Marland Kitchen  Acute respiratiory failure requiring BiPap 06/24/2011  . Aortic valvular stenosis, moderate to severe 06/24/2011    a. 10/2012 Echo: mod AS,  Valve 1.25 cm^2 (VTI), 1.19cm^2 (Vmax).  . Allergy   . Coronary artery disease     a. 03/2009 PCI RCA (3.0x18 BMS).  . Exertional shortness of breath   . Arthritis     "back" (10/30/2012)  . Rash     a. felt to be 2/2 lasix/torsemide in setting of sulfa allergy (lasix d/c ~ 06/2014, torsemide d/c 08/09/2014).    Past Surgical History  Procedure Laterality Date  . Cardioversion  06/19/2011    Procedure: CARDIOVERSION;  Surgeon: Sanda Klein, MD;  Location: [facility];  Service: Cardiovascular;  Laterality: N/A;  . Appendectomy    . Angioplasty    . Cataract extraction w/ intraocular lens  implant, bilateral    . Lumbar disc surgery  X 2    "cleaned out scar tissue"   . Coronary angioplasty with stent placement      "1" (10/30/2012)  . Dilation and curettage of uterus    . Excisional hemorrhoidectomy    . Wrist fracture surgery Left   . Shoulder acromioplasty Left     "fell; put a new ball in" (10/30/2012)  . Cardioversion  07/11/2010    Successful coversion to sinus rhythm  . Cardiac catheterization  10/06/2009    Continue medical therapy  . Cardiac catheterization  04/07/2009    RCA stented with a 3x105m stent resulting in a reduction of 90% narrowing to normal  . Cardioversion N/A 08/10/2014    Procedure: CARDIOVERSION;  Surgeon: MSanda Klein MD;  Location: [facility]  Service:  Cardiovascular;  Laterality: N/A;  . Back surgery    . Fracture surgery    . Hip arthroplasty Right 04/17/2015    Procedure: HIP MONOPOLAR HIP HEMI ARTHROPLASTY;  Surgeon: Marybelle Killings, MD;  Location: [facility];  Service: Orthopedics;  Laterality: Right;      Medication List    Notice    This visit is on the same day as an admission, and a visit start time could not be determined. If the visit took place after discharge, manually review the med list with the patient.      No orders of the defined types were placed in this encounter.    Immunization History  Administered Date(s) Administered  .  Influenza Split 03/03/2012, 03/09/2014  . Influenza Whole 03/05/2011  . Influenza, High Dose Seasonal PF 02/20/2015  . Influenza-Unspecified 02/24/2013  . Pneumococcal Polysaccharide-23 12/15/2006, 11/13/2012  . Td 05/27/2004  . Zoster 12/22/2006    Social History  Substance Use Topics  . Smoking status: Never Smoker   . Smokeless tobacco: Never Used  . Alcohol Use: No    Family history is + HD, HTN   Review of Systems  DATA OBTAINED: from patient GENERAL:  no fevers, fatigue, appetite changes SKIN: No itching, rash or wounds EYES: No eye pain, redness, discharge EARS: No earache, tinnitus, change in hearing NOSE: No congestion, drainage or bleeding  MOUTH/THROAT: No mouth or tooth pain, No sore throat RESPIRATORY: No cough, wheezing, SOB CARDIAC: No chest pain, palpitations, lower extremity edema  GI: No abdominal pain, No N/V/D or constipation, No heartburn or reflux  GU: No dysuria, frequency or urgency, or incontinence  MUSCULOSKELETAL: No unrelieved bone/joint pain NEUROLOGIC: No headache, dizziness or focal weakness PSYCHIATRIC: No c/o anxiety or sadness   Filed Vitals:   04/20/15 1207  BP: 88/60  Pulse: 86  Temp: 97.7 F (36.5 C)  Resp: 18    SpO2 Readings from Last 1 Encounters:  04/19/15 93%        Physical Exam  GENERAL APPEARANCE: Alert, conversant,  No acute distress.  SKIN: No diaphoresis rash; Dressing R hip op site; no redness , some heat c/w post surgery HEAD: Normocephalic, atraumatic  EYES: Conjunctiva/lids clear. Pupils round, reactive. EOMs intact.  EARS: External exam WNL, canals clear. Hearing grossly normal.  NOSE: No deformity or discharge.  MOUTH/THROAT: Lips w/o lesions  RESPIRATORY: Breathing is even, unlabored. Lung sounds are clear   CARDIOVASCULAR: Heart RRR no murmurs, rubs or gallops. No peripheral edema.   GASTROINTESTINAL: Abdomen is soft, non-tender, not distended w/ normal bowel sounds. GENITOURINARY: Bladder non  tender, not distended  MUSCULOSKELETAL: No abnormal joints or musculature NEUROLOGIC:  Cranial nerves 2-12 grossly intact. Moves all extremities  PSYCHIATRIC: Mood and affect appropriate to situation, no behavioral issues  Patient Active Problem List   Diagnosis Date Noted  . Postoperative anemia due to acute blood loss 04/20/2015  . Closed right hip fracture ([place]) 04/15/2015  . Senile purpura ([place]) 04/13/2015  . Angioimmunoblastic lymphoma ([place]) 04/05/2015  . PAF (paroxysmal atrial fibrillation) ([place])   . Arthritis 09/13/2014  . Intermittent LBBB 08/16/2014  . Drug-induced bradycardia 02/20/2014  . Prolonged Q-T interval on ECG 11/02/2012  . Long term current use of anticoagulant therapy 08/12/2012  . Aortic valvular stenosis, moderate to severe 06/24/2011  . Chronic diastolic heart failure ([place]) 06/21/2011  . Hyperlipidemia 06/21/2011  . Hypertensive heart disease with CHF (congestive heart failure) ([place]) 06/21/2011  . Hypothyroidism, goitrous 06/21/2011  . Coronary artery disease 06/21/2011  CBC    Component Value Date/Time   WBC 12.5* 04/19/2015 0540   WBC 8.4 03/22/2015 1620   WBC 8.6 07/19/2012 1035   RBC 3.17* 04/19/2015 0540   RBC 3.95 03/22/2015 1620   RBC 4.07 07/19/2012 1035   HGB 9.6* 04/19/2015 0540   HGB 12.1 03/22/2015 1620   HGB 11.2* 07/19/2012 1035   HCT 29.9* 04/19/2015 0540   HCT 37.5 03/22/2015 1620   HCT 37.2* 07/19/2012 1035   PLT 166 04/19/2015 0540   PLT 260 03/22/2015 1620   MCV 94.3 04/19/2015 0540   MCV 94.9 03/22/2015 1620   MCV 91.5 07/19/2012 1035   LYMPHSABS 1.3 04/15/2015 2213   MONOABS 0.7 04/15/2015 2213   EOSABS 0.0 04/15/2015 2213   BASOSABS 0.2* 04/15/2015 2213    CMP     Component Value Date/Time   NA 139 04/19/2015 0540   NA 144 03/22/2015 1621   K 3.6 04/19/2015 0540   K 4.4 03/22/2015 1621   CL 109 04/19/2015 0540   CO2 25 04/19/2015 0540   CO2 26 03/22/2015 1621   GLUCOSE 122* 04/19/2015 0540   GLUCOSE 95  03/22/2015 1621   BUN 22* 04/19/2015 0540   BUN 15.0 03/22/2015 1621   CREATININE 0.90 04/19/2015 0540   CREATININE 1.0 03/22/2015 1621   CREATININE 1.07* 01/31/2015 0853   CALCIUM 8.3* 04/19/2015 0540   CALCIUM 9.5 03/22/2015 1621   PROT 6.5 04/16/2015 0200   PROT 7.4 03/22/2015 1621   ALBUMIN 3.3* 04/16/2015 0200   ALBUMIN 4.1 03/22/2015 1621   AST 21 04/16/2015 0200   AST 20 03/22/2015 1621   ALT 16 04/16/2015 0200   ALT 17 03/22/2015 1621   ALKPHOS 72 04/16/2015 0200   ALKPHOS 98 03/22/2015 1621   BILITOT 0.9 04/16/2015 0200   BILITOT 0.79 03/22/2015 1621   GFRNONAA 55* 04/19/2015 0540   GFRNONAA 46* 01/31/2015 0853   GFRAA >60 04/19/2015 0540   GFRAA 53* 01/31/2015 0853    No results found for: HGBA1C   Dg Chest 1 View  04/15/2015  CLINICAL DATA:  Patient with hip pain status post fall. No chest pain. EXAM: CHEST 1 VIEW COMPARISON:  Chest radiograph 02/07/2015 FINDINGS: Stable cardiomegaly. Unchanged bilateral coarse interstitial pulmonary opacities. Stable biapical pleural parenchymal thickening. No large area of pulmonary consolidation. Stable blunting of the costophrenic angles bilaterally. Left shoulder arthroplasty. Mid thoracic spine degenerative changes. IMPRESSION: No acute cardiopulmonary process. Stable coarse interstitial opacities. Electronically Signed   By: Lovey Newcomer M.D.   On: 04/15/2015 19:03   Dg Hip Unilat With Pelvis 2-3 Views Right  04/15/2015  CLINICAL DATA:  Lateral right hip pain status post fall. EXAM: DG HIP (WITH OR WITHOUT PELVIS) 2-3V RIGHT COMPARISON:  None. FINDINGS: There is generalized osteopenia. There is a mildly displaced right femoral neck fracture. There is no dislocation. There is no other fracture or dislocation. There is peripheral vascular atherosclerotic disease. IMPRESSION: Mildly displaced right femoral neck fracture. Electronically Signed   By: Kathreen Devoid   On: 04/15/2015 19:01   Dg Femur 1v Right  04/15/2015  CLINICAL DATA:   Fall in garage today. Right hip pain. Initial encounter. EXAM: RIGHT FEMUR 1 VIEW COMPARISON:  None. FINDINGS: Mildly displaced right femoral neck fracture is seen. No evidence hip dislocation. No evidence of distal femur fracture. Generalized osteopenia noted. Old fracture deformity of right inferior pubic ramus noted. Peripheral vascular calcification also demonstrated. IMPRESSION: Acute right femoral neck fracture. Electronically Signed   By: Earle Gell  M.D.   On: 04/15/2015 19:01    Not all labs, radiology exams or other studies done during hospitalization come through on my EPIC note; however they are reviewed by me.    Assessment and Plan  Closed right hip fracture ([place]) following a mechanical fall, patient was seen by orthopedics and is now status post right hip replacement for hip fracture.This appears to be medically stable postoperatively.SNF - admit OT/PT, coumadin as prophylaxis, will actively titrate   PAF (paroxysmal atrial fibrillation) ([facility]) Heart rate initially not optimally controlled;  Metoprolol dosage has been increased to 50 mg twice a day-this will be continued-Coumadin initially held on admission-has been restarted SNF -  check INR in the next 2 days. Patient will get 3 mg Coumadin 11/23, and on 11/24 when resume usual home dosage of 2 mg; will titrate actively   Chronic diastolic heart failure ([facility]) Currently well compensated and euvolemic. SNF - Continue spironolactone. And lopressor  Coronary artery disease Status post bare metal stent to the RCA in 2010. Currently stable. EKG does not show any ischemic changes. Patient denies any chest pain or shortness of breath.   Aortic valvular stenosis, moderate to severe Appears to be asymptomatic. Continue to monitor.   Hypertensive heart disease with CHF (congestive heart failure) ([facility]) SNF - not sure BP is accurate , will retake, pt completely asymptomatic sitting up in bed; conr spironolactone and  lopressor  Hypothyroidism, goitrous SNF - TSH low but free T4 within nl limits; cont synthroid 25 mcg;Diffuse enlargement of the thyroid compatible with goiter with Hyperenhancing nodule right mid lobe measuring 6 x 8 x 12 mm: Seen on CT chest prior to this admission on 03/29/15-further workup deferred to the outpatient setting  Long term current use of anticoagulant therapy SNF - for PAF; coumadin will be actively titrated  Hyperlipidemia SNF - LDL 91, HDL 33 on lipitor 40 mg, will continue  Angioimmunoblastic lymphoma ([place]) : Follows with oncology-Dr Charolette Forward further workup in the outpatient setting   Postoperative anemia due to acute blood loss SNF - d/c Hb 9.6; will check CBC to follow   Time spent 45 min;> 50% of time with patient was spent reviewing records, labs, tests and studies, counseling and developing plan of care  Hennie Duos, MD

## 2015-04-20 NOTE — Assessment & Plan Note (Signed)
following a mechanical fall, patient was seen by orthopedics and is now status post right hip replacement for hip fracture.This appears to be medically stable postoperatively.SNF - admit OT/PT, coumadin as prophylaxis, will actively titrate

## 2015-04-20 NOTE — Assessment & Plan Note (Signed)
SNF - for PAF; coumadin will be actively titrated

## 2015-04-20 NOTE — Assessment & Plan Note (Addendum)
SNF - TSH low but free T4 within nl limits; cont synthroid 25 mcg;Diffuse enlargement of the thyroid compatible with goiter with Hyperenhancing nodule right mid lobe measuring 6 x 8 x 12 mm: Seen on CT chest prior to this admission on 03/29/15-further workup deferred to the outpatient setting

## 2015-04-20 NOTE — Assessment & Plan Note (Signed)
Heart rate initially not optimally controlled;  Metoprolol dosage has been increased to 50 mg twice a day-this will be continued-Coumadin initially held on admission-has been restarted SNF -  check INR in the next 2 days. Patient will get 3 mg Coumadin 11/23, and on 11/24 when resume usual home dosage of 2 mg; will titrate actively

## 2015-04-20 NOTE — Assessment & Plan Note (Signed)
SNF - LDL 91, HDL 33 on lipitor 40 mg, will continue

## 2015-04-20 NOTE — Progress Notes (Signed)
This encounter was created in error - please disregard.

## 2015-04-20 NOTE — Assessment & Plan Note (Signed)
Status post bare metal stent to the RCA in 2010. Currently stable. EKG does not show any ischemic changes. Patient denies any chest pain or shortness of breath.

## 2015-04-24 ENCOUNTER — Non-Acute Institutional Stay (SKILLED_NURSING_FACILITY): Payer: Medicare Other | Admitting: Internal Medicine

## 2015-04-24 ENCOUNTER — Encounter: Payer: Self-pay | Admitting: Internal Medicine

## 2015-04-24 DIAGNOSIS — I480 Paroxysmal atrial fibrillation: Secondary | ICD-10-CM

## 2015-04-24 DIAGNOSIS — R5383 Other fatigue: Secondary | ICD-10-CM

## 2015-04-24 DIAGNOSIS — Z7901 Long term (current) use of anticoagulants: Secondary | ICD-10-CM

## 2015-04-24 NOTE — Progress Notes (Signed)
Patient ID: [name], female   DOB: March 05, 1924, 81 y.o.   MRN: [id number] MRN: [id number] Name: [name]  Sex: female Age: 81 y.o. DOB: 1924-03-02  [place] #: [name] Facility/Room:505 Level Of Care: SNF Provider: Wille Celeste Emergency Contacts: Extended Emergency Contact Information Primary Emergency Contact: [name] Address: [address]          [place], [place] [address] [name] of [place] Phone: [phone] Mobile Phone: [phone] Relation: Spouse Secondary Emergency Contact: [name]          [place], [place] [address] [place] Mobile Phone: [phone] Relation: Son  Code Status:   Allergies: [name]; Tape; Furosemide; Sulfa antibiotics; [name]; and Torsemide  Chief Complaint  Patient presents with  . Acute Visit   follow-up atrial fibrillation and anticoagulation with Coumadin-also followed fall over the weekend and somnolence--  HPI: Patient is 81 y.o. female with past medical history of chronic diastolic CHF, hypothyroidism, hyperlipidemia, paroxysmal atrial fibrillation, coronary artery disease, presented after a mechanical fall at home resulting in fracture of the right hip. She was hospitalized at [facility] from 11/19-23 for further management. During her stay it was noted that pt was in mid work-up for a thyroid goiter and axillary adenopathy followed by oncology. These will be f/u s/p d/c fro SNF. Pt was admitted to SNF for OT/PT. While at SNF pt followed for CHF, tx with metoprolol and spironolactone, HTN, tx with same and for HLD , tx with lipitor She is receiving Coumadin for atrial fibrillation-INR is 1.3 this is actually down from 1.6 to on November 23-she did receive 2 days of 3 mg and then back to 2 mg--- She does have a fall history-in fact fell over the weekend her family would like her Norco discontinued thinking she does not do well with this and this lead to increased confusion and fall  risk and we will discontinue this she does continue with Tylenol.  She also has some somnolence appear although she is responsive today there is some thought this may be secondary to previous Norco use as well.  Her vital signs are stable  .  Past Medical History  Diagnosis Date  . Chronic diastolic CHF (congestive heart failure) ([place])     a. 10/2012 Echo: EF 55-60%, no rwma, Gr 2 DD, moderate AS, mod dil LA, mildly to mod dil RA.  Marland Kitchen Hypothyroidism   . Dyslipidemia   . Hypertension   . PAF (paroxysmal atrial fibrillation) ([place])     a. CHA2DS2VASc = 7-->chronic coumadin;  b. s/p DCCV 05/2011, 05/2012, 07/2014;  c. On amio.  Marland Kitchen  Acute respiratiory failure requiring BiPap 06/24/2011  . Aortic valvular stenosis, moderate to severe 06/24/2011    a. 10/2012 Echo: mod AS, Valve 1.25 cm^2 (VTI), 1.19cm^2 (Vmax).  . Allergy   . Coronary artery disease     a. 03/2009 PCI RCA (3.0x18 BMS).  . Exertional shortness of breath   . Arthritis     "back" (10/30/2012)  . Rash     a. felt to be 2/2 lasix/torsemide in setting of sulfa allergy (lasix d/c ~ 06/2014, torsemide d/c 08/09/2014).    Past Surgical History  Procedure Laterality Date  . Cardioversion  06/19/2011    Procedure: CARDIOVERSION;  Surgeon: Sanda Klein, MD;  Location: [facility];  Service: Cardiovascular;  Laterality: N/A;  . Appendectomy    . Angioplasty    . Cataract extraction w/ intraocular lens  implant, bilateral    .  Lumbar disc surgery  X 2    "cleaned out scar tissue"   . Coronary angioplasty with stent placement      "1" (10/30/2012)  . Dilation and curettage of uterus    . Excisional hemorrhoidectomy    . Wrist fracture surgery Left   . Shoulder acromioplasty Left     "fell; put a new ball in" (10/30/2012)  . Cardioversion  07/11/2010    Successful coversion to sinus rhythm  . Cardiac catheterization  10/06/2009    Continue medical therapy  . Cardiac catheterization  04/07/2009    RCA stented with a 3x44m stent resulting in a  reduction of 90% narrowing to normal  . Cardioversion N/A 08/10/2014    Procedure: CARDIOVERSION;  Surgeon: MSanda Klein MD;  [facility];  Service: Cardiovascular;  Laterality: N/A;  . Back surgery    . Fracture surgery    . Hip arthroplasty Right 04/17/2015    Procedure: HIP MONOPOLAR HIP HEMI ARTHROPLASTY;  Surgeon: MMarybelle Killings MD;  Location: [facility]  Service: Orthopedics;  Laterality: Right;      Medication List       This list is accurate as of: 04/24/15 11:59 PM.  Always use your most recent med list.               acetaminophen 650 MG CR tablet  Commonly known as:  TYLENOL  Take 650 mg by mouth 3 (three) times daily. Morning, supper and bedtime     albuterol 108 (90 BASE) MCG/ACT inhaler  Commonly known as:  PROVENTIL HFA;VENTOLIN HFA  Inhale 2 puffs into the lungs every 6 (six) hours as needed for wheezing or shortness of breath.     atorvastatin 40 MG tablet  Commonly known as:  LIPITOR  TAKE 1 TABLET DAILY     feeding supplement (ENSURE ENLIVE) Liqd  Take 237 mLs by mouth daily.     fluticasone 50 MCG/ACT nasal spray  Commonly known as:  FLONASE  Place 2 sprays into both nostrils daily. Use as directed     folic acid 1 MG tablet  Commonly known as:  FOLVITE  Take 2 tablets (2 mg total) by mouth daily.     HYDROcodone-acetaminophen 7.5-325 MG tablet  Commonly known as:  NORCO  Take 1 tablet by mouth every 6 (six) hours as needed (breakthrough pain).     ipratropium 0.03 % nasal spray  Commonly known as:  ATROVENT  Place 2 sprays into both nostrils 2 (two) times daily. Use as directed     levothyroxine 75 MCG tablet  Commonly known as:  SYNTHROID, LEVOTHROID  Take 1 tablet (75 mcg total) by mouth daily before breakfast.     loratadine 10 MG tablet  Commonly known as:  CLARITIN  Take 10 mg by mouth daily with breakfast.     metoprolol tartrate 25 MG tablet  Commonly known as:  LOPRESSOR  Take 2 tablets (50 mg total) by mouth 2 (two)  times daily.     multivitamin with minerals Tabs tablet  Take 1 tablet by mouth daily. Centrum Silver     senna-docusate 8.6-50 MG tablet  Commonly known as:  Senokot-S  Take 2 tablets by mouth at bedtime.     spironolactone 25 MG tablet  Commonly known as:  ALDACTONE  Take 1 tablet (25 mg total) by mouth daily.     TEARS PLUS OP  Place 1 drop into both eyes at bedtime.     triamcinolone cream 0.1 %  Commonly known as:  KENALOG  Apply 1 application topically daily as needed (rash).     warfarin 1 MG tablet  Commonly known as:  COUMADIN  Take 2 tablets by mouth daily on 11/24-but on 11/23 take 3 mg coumdain for one dose only     warfarin 1 MG tablet  Commonly known as:  COUMADIN  Take 3 tablets (3 mg total) by mouth one time only at 6 PM. On 11/23 only        No orders of the defined types were placed in this encounter.    Immunization History  Administered Date(s) Administered  . Influenza Split 03/03/2012, 03/09/2014  . Influenza Whole 03/05/2011  . Influenza, High Dose Seasonal PF 02/20/2015  . Influenza-Unspecified 02/24/2013  . Pneumococcal Polysaccharide-23 12/15/2006, 11/13/2012  . Td 05/27/2004  . Zoster 12/22/2006    Social History  Substance Use Topics  . Smoking status: Never Smoker   . Smokeless tobacco: Never Used  . Alcohol Use: No    Family history is + HD, HTN   Review of Systems  DATA OBTAINED: from -husband,nurse GENERAL:  no fevers, fatigue, appetite changes--has somnolence but she is responsive does talk SKIN: No itching, rash or wounds EYES: No eye pain, redness, discharge EARS: No earache, tinnitus, change in hearing NOSE: No congestion, drainage or bleeding  MOUTH/THROAT: No mouth or tooth pain, No sore throat RESPIRATORY: No cough, wheezing, SOB CARDIAC: No chest pain, palpitations   has,mild lower extremity edema  GI: No abdominal pain, No N/V/D or constipation, No heartburn or reflux  GU: No dysuria, frequency or urgency, or  incontinence  MUSCULOSKELETAL: No unrelieved bone/joint pain NEUROLOGIC: No headache, dizziness or focal weakness PSYCHIATRIC: No c/o anxiety or sadness   Filed Vitals:   04/24/15 2206  BP: 114/70  Pulse: 90  Temp: 97.1 F (36.2 C)  Resp: 16    SpO2 Readings from Last 1 Encounters:  04/19/15 93%        Physical Exam  GENERAL APPEARANCE: Alert, conversant,  No acute distress.  SKIN: No diaphoresis rash; some petechia left lower leg-says chronic bruising upper and lower extremities-surgical site right hip stable without sign of infection per nursing HEAD: Normocephalic, atraumatic  EYES: Conjunctiva/lids clear. Pupils round, reactive. EOMs intact.  EARS: External exam WNL, canals clear. Hearing grossly normal.  NOSE: No deformity or discharge.  MOUTH/THROAT: Oropharynx is clear mucous membranes moist  RESPIRATORY: Breathing is even, unlabored. Lung sounds are clear   CARDIOVASCULAR: Heart regular irregular rate no murmurs, rubs or gallops. mild peripheral edema--pedal pulses intact.   GASTROINTESTINAL: Abdomen is soft, non-tender, not distended w/ normal bowel sounds. GENITOURINARY: Bladder non tender, not distended  MUSCULOSKELETAL: No abnormal joints or musculature--grip strength bilaterally appears to be intact moves all extremities limited right leg secondary to having an immobilizer on NEUROLOGIC:  Cranial nerves 2-12 grossly intact. Moves all extremities  PSYCHIATRIC: Mood and affect appropriate to situation, she appears somnolent but easily arousable does speak and interact and then seems to want to fall asleep again  Patient Active Problem List   Diagnosis Date Noted  . Lethargy 04/24/2015  . Postoperative anemia due to acute blood loss 04/20/2015  . Closed right hip fracture ([place]) 04/15/2015  . Senile purpura ([place]) 04/13/2015  . Angioimmunoblastic lymphoma ([place]) 04/05/2015  . PAF (paroxysmal atrial fibrillation) ([place])   . Arthritis 09/13/2014  . Intermittent LBBB  08/16/2014  . Drug-induced bradycardia 02/20/2014  . Prolonged Q-T interval on ECG 11/02/2012  . Long term current use  of anticoagulant therapy 08/12/2012  . Aortic valvular stenosis, moderate to severe 06/24/2011  . Chronic diastolic heart failure ([place]) 06/21/2011  . Hyperlipidemia 06/21/2011  . Hypertensive heart disease with CHF (congestive heart failure) ([place]) 06/21/2011  . Hypothyroidism, goitrous 06/21/2011  . Coronary artery disease 06/21/2011     Labs.  04/21/1999.  INR 1.3.    04/19/2015.  INR 1.62  WBC 12.5 hemoglobin 9.6 platelets 160.  Sodium 139 potassium 3.6 BUN 22 creatinine 0.9   CBC    Component Value Date/Time   WBC 12.5* 04/19/2015 0540   WBC 8.4 03/22/2015 1620   WBC 8.6 07/19/2012 1035   RBC 3.17* 04/19/2015 0540   RBC 3.95 03/22/2015 1620   RBC 4.07 07/19/2012 1035   HGB 9.6* 04/19/2015 0540   HGB 12.1 03/22/2015 1620   HGB 11.2* 07/19/2012 1035   HCT 29.9* 04/19/2015 0540   HCT 37.5 03/22/2015 1620   HCT 37.2* 07/19/2012 1035   PLT 166 04/19/2015 0540   PLT 260 03/22/2015 1620   MCV 94.3 04/19/2015 0540   MCV 94.9 03/22/2015 1620   MCV 91.5 07/19/2012 1035   LYMPHSABS 1.3 04/15/2015 2213   MONOABS 0.7 04/15/2015 2213   EOSABS 0.0 04/15/2015 2213   BASOSABS 0.2* 04/15/2015 2213    CMP     Component Value Date/Time   NA 139 04/19/2015 0540   NA 144 03/22/2015 1621   K 3.6 04/19/2015 0540   K 4.4 03/22/2015 1621   CL 109 04/19/2015 0540   CO2 25 04/19/2015 0540   CO2 26 03/22/2015 1621   GLUCOSE 122* 04/19/2015 0540   GLUCOSE 95 03/22/2015 1621   BUN 22* 04/19/2015 0540   BUN 15.0 03/22/2015 1621   CREATININE 0.90 04/19/2015 0540   CREATININE 1.0 03/22/2015 1621   CREATININE 1.07* 01/31/2015 0853   CALCIUM 8.3* 04/19/2015 0540   CALCIUM 9.5 03/22/2015 1621   PROT 6.5 04/16/2015 0200   PROT 7.4 03/22/2015 1621   ALBUMIN 3.3* 04/16/2015 0200   ALBUMIN 4.1 03/22/2015 1621   AST 21 04/16/2015 0200   AST 20 03/22/2015 1621    ALT 16 04/16/2015 0200   ALT 17 03/22/2015 1621   ALKPHOS 72 04/16/2015 0200   ALKPHOS 98 03/22/2015 1621   BILITOT 0.9 04/16/2015 0200   BILITOT 0.79 03/22/2015 1621   GFRNONAA 55* 04/19/2015 0540   GFRNONAA 46* 01/31/2015 0853   GFRAA >60 04/19/2015 0540   GFRAA 53* 01/31/2015 0853       Dg Chest 1 View  04/15/2015  CLINICAL DATA:  Patient with hip pain status post fall. No chest pain. EXAM: CHEST 1 VIEW COMPARISON:  Chest radiograph 02/07/2015 FINDINGS: Stable cardiomegaly. Unchanged bilateral coarse interstitial pulmonary opacities. Stable biapical pleural parenchymal thickening. No large area of pulmonary consolidation. Stable blunting of the costophrenic angles bilaterally. Left shoulder arthroplasty. Mid thoracic spine degenerative changes. IMPRESSION: No acute cardiopulmonary process. Stable coarse interstitial opacities. Electronically Signed   By: Lovey Newcomer M.D.   On: 04/15/2015 19:03   Dg Hip Unilat With Pelvis 2-3 Views Right  04/15/2015  CLINICAL DATA:  Lateral right hip pain status post fall. EXAM: DG HIP (WITH OR WITHOUT PELVIS) 2-3V RIGHT COMPARISON:  None. FINDINGS: There is generalized osteopenia. There is a mildly displaced right femoral neck fracture. There is no dislocation. There is no other fracture or dislocation. There is peripheral vascular atherosclerotic disease. IMPRESSION: Mildly displaced right femoral neck fracture. Electronically Signed   By: Kathreen Devoid   On: 04/15/2015 19:01  Dg Femur 1v Right  04/15/2015  CLINICAL DATA:  Fall in garage today. Right hip pain. Initial encounter. EXAM: RIGHT FEMUR 1 VIEW COMPARISON:  None. FINDINGS: Mildly displaced right femoral neck fracture is seen. No evidence hip dislocation. No evidence of distal femur fracture. Generalized osteopenia noted. Old fracture deformity of right inferior pubic ramus noted. Peripheral vascular calcification also demonstrated. IMPRESSION: Acute right femoral neck fracture. Electronically  Signed   By: Earle Gell M.D.   On: 04/15/2015 19:01    Not all labs, radiology exams or other studies done during hospitalization come through on my EPIC note; however they are reviewed by me.    Assessment and Plan  #1 somnolence-this appears to be somewhat intermittent it could be medication related Will DC the Norco per family requesting concerns of somnolence and stick with Tylenol and monitor.  Also will obtain lab work tomorrow including a CBC CMP and ammonia level have noted her white count has been mildly elevated here for a while--.  #2-#2 anticoagulation with history of A. fib-INR continues to be subtherapeutic-she is back to 2 mg a day will increase this back up to 3 mg a day and check in 2 days-somewhat judicious here with her history of falls trying to avoid over anticoagulation.  [id number]      Vaiden Adames C,

## 2015-05-01 ENCOUNTER — Ambulatory Visit: Payer: Medicare Other | Admitting: Pharmacist Clinician (PhC)/ Clinical Pharmacy Specialist

## 2015-05-01 ENCOUNTER — Ambulatory Visit: Payer: Medicare Other | Admitting: Cardiovascular Disease

## 2015-05-04 ENCOUNTER — Non-Acute Institutional Stay (SKILLED_NURSING_FACILITY): Payer: Medicare Other | Admitting: Internal Medicine

## 2015-05-04 ENCOUNTER — Encounter: Payer: Self-pay | Admitting: Internal Medicine

## 2015-05-04 DIAGNOSIS — S72001D Fracture of unspecified part of neck of right femur, subsequent encounter for closed fracture with routine healing: Secondary | ICD-10-CM

## 2015-05-04 DIAGNOSIS — I5032 Chronic diastolic (congestive) heart failure: Secondary | ICD-10-CM | POA: Diagnosis not present

## 2015-05-04 DIAGNOSIS — D62 Acute posthemorrhagic anemia: Secondary | ICD-10-CM | POA: Diagnosis not present

## 2015-05-04 DIAGNOSIS — I480 Paroxysmal atrial fibrillation: Secondary | ICD-10-CM | POA: Diagnosis not present

## 2015-05-04 NOTE — Progress Notes (Signed)
Patient ID: NAKENYA THEALL, female   DOB: 08-21-1923, 81 y.o.   MRN: [id number]  MRN: [id number] Name: [name]  Sex: female Age: 81 y.o. DOB: 05/21/1924  [facility] #: [facility] Facility/Room:505 Level Of Care: SNF Provider: Wille Celeste Emergency Contacts: Extended Emergency Contact Information Primary Emergency Contact: Clarisa Schools Address: [address]          [place], [place] [address] [place] Phone: [phone] Mobile Phone: [phone] Relation: Spouse Secondary Emergency Contact: Dellis Anes          [place], [place] [address] [place] Mobile Phone: [phone] Relation: Son  Code Status:   Allergies: [name]; Tape; Furosemide; Sulfa antibiotics; [name]; and Torsemide  Chief Complaint  Patient presents with  . Discharge Note   f  HPI: Patient is 81 y.o. female with past medical history of chronic diastolic CHF, hypothyroidism, hyperlipidemia, paroxysmal atrial fibrillation, coronary artery disease, presented after a mechanical fall at home resulting in fracture of the right hip. She was hospitalized at [facility] from 11/19-23 for further management. During her stay it was noted that pt was in mid work-up for a thyroid goiter and axillary adenopathy followed by oncology. These will be f/u s/p d/c fro SNF. Pt was admitted to SNF for OT/PT. While at SNF pt followed for CHF, tx with metoprolol and spironolactone, HTN, tx with same and for HLD , tx with lipitor She is receiving Coumadin for atrial fibrillation--INR is being monitored and will need follow-up by home health and primary care provider notified of results.  She did have some increased right leg edema however venous Doppler was negative.  Initially she also had some increased somnolence and mental status changes her Norco has been discontinued and this appears to have had a very beneficial effects she is bright alert participates with therapy and has done  well   She has no complaints today is looking forward to going home she does have a very supportive husband and family-she will need home health support and continued PT and OT as well as nursing support for her medical issues and monitoring of her Coumadin dose.  She also will need a rolling walker for assistance with ambulation  .  Past Medical History  Diagnosis Date  . Chronic diastolic CHF (congestive heart failure) ([place])     a. 10/2012 Echo: EF 55-60%, no rwma, Gr 2 DD, moderate AS, mod dil LA, mildly to mod dil RA.  Marland Kitchen Hypothyroidism   . Dyslipidemia   . Hypertension   . PAF (paroxysmal atrial fibrillation) ([place])     a. CHA2DS2VASc = 7-->chronic coumadin;  b. s/p DCCV 05/2011, 05/2012, 07/2014;  c. On amio.  Marland Kitchen  Acute respiratiory failure requiring BiPap 06/24/2011  . Aortic valvular stenosis, moderate to severe 06/24/2011    a. 10/2012 Echo: mod AS, Valve 1.25 cm^2 (VTI), 1.19cm^2 (Vmax).  . Allergy   . Coronary artery disease     a. 03/2009 PCI RCA (3.0x18 BMS).  . Exertional shortness of breath   . Arthritis     "back" (10/30/2012)  . Rash     a. felt to be 2/2 lasix/torsemide in setting of sulfa allergy (lasix d/c ~ 06/2014, torsemide d/c 08/09/2014).    Past Surgical History  Procedure Laterality Date  . Cardioversion  06/19/2011    Procedure: CARDIOVERSION;  Surgeon: Sanda Klein, MD;  Location: [place];  Service: Cardiovascular;  Laterality: N/A;  . Appendectomy    . Angioplasty    .  Cataract extraction w/ intraocular lens  implant, bilateral    . Lumbar disc surgery  X 2    "cleaned out scar tissue"   . Coronary angioplasty with stent placement      "1" (10/30/2012)  . Dilation and curettage of uterus    . Excisional hemorrhoidectomy    . Wrist fracture surgery Left   . Shoulder acromioplasty Left     "fell; put a new ball in" (10/30/2012)  . Cardioversion  07/11/2010    Successful coversion to sinus rhythm  . Cardiac catheterization  10/06/2009    Continue medical  therapy  . Cardiac catheterization  04/07/2009    RCA stented with a [id number] stent resulting in a reduction of 90% narrowing to normal  . Cardioversion N/A 08/10/2014    Procedure: CARDIOVERSION;  Surgeon: MSanda Klein MD;  Location: [facility];  Service: Cardiovascular;  Laterality: N/A;  . Back surgery    . Fracture surgery    . Hip arthroplasty Right 04/17/2015    Procedure: HIP MONOPOLAR HIP HEMI ARTHROPLASTY;  Surgeon: MMarybelle Killings MD;  [place] [facility]  Service: Orthopedics;  Laterality: Right;      Medication List       This list is accurate as of: 05/04/15 11:59 PM.  Always use your most recent med list.               acetaminophen 650 MG CR tablet  Commonly known as:  TYLENOL  Take 650 mg by mouth 3 (three) times daily. Morning, supper and bedtime     albuterol 108 (90 BASE) MCG/ACT inhaler  Commonly known as:  PROVENTIL HFA;VENTOLIN HFA  Inhale 2 puffs into the lungs every 6 (six) hours as needed for wheezing or shortness of breath.     atorvastatin 40 MG tablet  Commonly known as:  LIPITOR  TAKE 1 TABLET DAILY     feeding supplement (ENSURE ENLIVE) Liqd  Take 237 mLs by mouth daily.     fluticasone 50 MCG/ACT nasal spray  Commonly known as:  FLONASE  Place 2 sprays into both nostrils daily. Use as directed     folic acid 1 MG tablet  Commonly known as:  FOLVITE  Take 2 tablets (2 mg total) by mouth daily.     ipratropium 0.03 % nasal spray  Commonly known as:  ATROVENT  Place 2 sprays into both nostrils 2 (two) times daily. Use as directed     levothyroxine 75 MCG tablet  Commonly known as:  SYNTHROID, LEVOTHROID  Take 1 tablet (75 mcg total) by mouth daily before breakfast.     loratadine 10 MG tablet  Commonly known as:  CLARITIN  Take 10 mg by mouth daily with breakfast.     metoprolol tartrate 25 MG tablet  Commonly known as:  LOPRESSOR  Take 2 tablets (50 mg total) by mouth 2 (two) times daily.     multivitamin with minerals Tabs tablet   Take 1 tablet by mouth daily. Centrum Silver     senna-docusate 8.6-50 MG tablet  Commonly known as:  Senokot-S  Take 2 tablets by mouth at bedtime.     spironolactone 25 MG tablet  Commonly known as:  ALDACTONE  Take 1 tablet (25 mg total) by mouth daily.     TEARS PLUS OP  Place 1 drop into both eyes at bedtime.     triamcinolone cream 0.1 %  Commonly known as:  KENALOG  Apply 1 application topically daily as needed (rash).  warfarin 1 MG tablet  Commonly known as:  COUMADIN  Take 2 tablets by mouth daily on 11/24-but on 11/23 take 3 mg coumdain for one dose only     warfarin 1 MG tablet  Commonly known as:  COUMADIN  Take 3 tablets (3 mg total) by mouth one time only at 6 PM. On 11/23 only       of note she now takes Coumadin 3 mg daily    Immunization History  Administered Date(s) Administered  . Influenza Split 03/03/2012, 03/09/2014  . Influenza Whole 03/05/2011  . Influenza, High Dose Seasonal PF 02/20/2015  . Influenza-Unspecified 02/24/2013  . Pneumococcal Polysaccharide-23 12/15/2006, 11/13/2012  . Td 05/27/2004  . Zoster 12/22/2006    Social History  Substance Use Topics  . Smoking status: Never Smoker   . Smokeless tobacco: Never Used  . Alcohol Use: No    Family history is + HD, HTN   Review of Systems  DATA OBTAINED: from -patient,nurse GENERAL:  no fevers, fatigue, appetite changes--somnolence has resolved SKIN: No itching, rash or wounds EYES: No eye pain, redness, discharge EARS: No earache, tinnitus, change in hearing NOSE: No congestion, drainage or bleeding  MOUTH/THROAT: No mouth or tooth pain, No sore throat RESPIRATORY: No cough, wheezing, SOB CARDIAC: No chest pain, palpitations   has,mild lower extremity edema  GI: No abdominal pain, No N/V/D or constipation, No heartburn or reflux  GU: No dysuria, frequency or urgency, or incontinence  MUSCULOSKELETAL: No unrelieved bone/joint pain--doing well with therapy NEUROLOGIC: No  headache, dizziness or focal weakness PSYCHIATRIC: No c/o anxiety or sadness   Filed Vitals:   05/04/15 2250  BP: 128/69  Pulse: 60  Temp: 97.2 F (36.2 C)  Resp: 18    SpO2 Readings from Last 1 Encounters:  04/19/15 93%        Physical Exam  GENERAL APPEARANCE: Alert, conversant,  No acute distress.  SKIN: No diaphoresis rash; some petechia left lower leg-says chronic bruising upper and lower extremities-surgical site right hip stable without sign of infection Steri-Strips are in place appears to be essentially well-healed.  She abrasion on her right elbow there is no sign of infectionhas a small HEAD: Normocephalic, atraumatic  EYES: Conjunctiva/lids clear. Pupils round, reactive. EOMs intact.  EARS: External exam WNL, canals clear. Hearing grossly normal.  NOSE: No deformity or discharge.  MOUTH/THROAT: Oropharynx is clear mucous membranes moist  RESPIRATORY: Breathing is even, unlabored. Lung sounds are clear   CARDIOVASCULAR: Heart regular irregular rate no murmurs, rubs or gallops. mild peripheral edema- right greater than left -pedal pulses intact.   GASTROINTESTINAL: Abdomen is soft, non-tender, not distended w/ normal bowel sounds.  MUSCULOSKELETAL: No abnormal joints or musculature She is able to stand up and use her walker with some assistance NEUROLOGIC:  Cranial nerves 2-12 grossly intact. Moves all extremities  PSYCHIATRIC: Mood and affect appropriate to situation, She is bright alert interactive significantly improved from when I last saw her   Patient Active Problem List   Diagnosis Date Noted  . Lethargy 04/24/2015  . Postoperative anemia due to acute blood loss 04/20/2015  . Closed right hip fracture ([place]) 04/15/2015  . Senile purpura ([place]) 04/13/2015  . Angioimmunoblastic lymphoma ([place]) 04/05/2015  . PAF (paroxysmal atrial fibrillation) ([place])   . Arthritis 09/13/2014  . Intermittent LBBB 08/16/2014  . Drug-induced bradycardia 02/20/2014  .  Prolonged Q-T interval on ECG 11/02/2012  . Long term current use of anticoagulant therapy 08/12/2012  . Aortic valvular stenosis, moderate to severe  06/24/2011  . Chronic diastolic heart failure ([place]) 06/21/2011  . Hyperlipidemia 06/21/2011  . Hypertensive heart disease with CHF (congestive heart failure) ([place]) 06/21/2011  . Hypothyroidism, goitrous 06/21/2011  . Coronary artery disease 06/21/2011     Labs.  Most recent sodium 140 potassium 4.0 BUN 29 creatinine 0.8.  Albumin 2.7-bilirubin 1.2-otherwise liver function tests within normal limits pneumonia level was 79.  WBC 10.9 hemoglobin 9.9 platelets 341  04/21/1999.  INR 1.3.    04/19/2015.  INR 1.62  WBC 12.5 hemoglobin 9.6 platelets 160.  Sodium 139 potassium 3.6 BUN 22 creatinine 0.9   CBC    Component Value Date/Time   WBC 12.5* 04/19/2015 0540   WBC 8.4 03/22/2015 1620   WBC 8.6 07/19/2012 1035   RBC 3.17* 04/19/2015 0540   RBC 3.95 03/22/2015 1620   RBC 4.07 07/19/2012 1035   HGB 9.6* 04/19/2015 0540   HGB 12.1 03/22/2015 1620   HGB 11.2* 07/19/2012 1035   HCT 29.9* 04/19/2015 0540   HCT 37.5 03/22/2015 1620   HCT 37.2* 07/19/2012 1035   PLT 166 04/19/2015 0540   PLT 260 03/22/2015 1620   MCV 94.3 04/19/2015 0540   MCV 94.9 03/22/2015 1620   MCV 91.5 07/19/2012 1035   LYMPHSABS 1.3 04/15/2015 2213   MONOABS 0.7 04/15/2015 2213   EOSABS 0.0 04/15/2015 2213   BASOSABS 0.2* 04/15/2015 2213    CMP     Component Value Date/Time   NA 139 04/19/2015 0540   NA 144 03/22/2015 1621   K 3.6 04/19/2015 0540   K 4.4 03/22/2015 1621   CL 109 04/19/2015 0540   CO2 25 04/19/2015 0540   CO2 26 03/22/2015 1621   GLUCOSE 122* 04/19/2015 0540   GLUCOSE 95 03/22/2015 1621   BUN 22* 04/19/2015 0540   BUN 15.0 03/22/2015 1621   CREATININE 0.90 04/19/2015 0540   CREATININE 1.0 03/22/2015 1621   CREATININE 1.07* 01/31/2015 0853   CALCIUM 8.3* 04/19/2015 0540   CALCIUM 9.5 03/22/2015 1621   PROT 6.5  04/16/2015 0200   PROT 7.4 03/22/2015 1621   ALBUMIN 3.3* 04/16/2015 0200   ALBUMIN 4.1 03/22/2015 1621   AST 21 04/16/2015 0200   AST 20 03/22/2015 1621   ALT 16 04/16/2015 0200   ALT 17 03/22/2015 1621   ALKPHOS 72 04/16/2015 0200   ALKPHOS 98 03/22/2015 1621   BILITOT 0.9 04/16/2015 0200   BILITOT 0.79 03/22/2015 1621   GFRNONAA 55* 04/19/2015 0540   GFRNONAA 46* 01/31/2015 0853   GFRAA >60 04/19/2015 0540   GFRAA 53* 01/31/2015 0853       Dg Chest 1 View  04/15/2015  CLINICAL DATA:  Patient with hip pain status post fall. No chest pain. EXAM: CHEST 1 VIEW COMPARISON:  Chest radiograph 02/07/2015 FINDINGS: Stable cardiomegaly. Unchanged bilateral coarse interstitial pulmonary opacities. Stable biapical pleural parenchymal thickening. No large area of pulmonary consolidation. Stable blunting of the costophrenic angles bilaterally. Left shoulder arthroplasty. Mid thoracic spine degenerative changes. IMPRESSION: No acute cardiopulmonary process. Stable coarse interstitial opacities. Electronically Signed   By: Lovey Newcomer M.D.   On: 04/15/2015 19:03   Dg Hip Unilat With Pelvis 2-3 Views Right  04/15/2015  CLINICAL DATA:  Lateral right hip pain status post fall. EXAM: DG HIP (WITH OR WITHOUT PELVIS) 2-3V RIGHT COMPARISON:  None. FINDINGS: There is generalized osteopenia. There is a mildly displaced right femoral neck fracture. There is no dislocation. There is no other fracture or dislocation. There is peripheral vascular atherosclerotic disease. IMPRESSION:  Mildly displaced right femoral neck fracture. Electronically Signed   By: Kathreen Devoid   On: 04/15/2015 19:01   Dg Femur 1v Right  04/15/2015  CLINICAL DATA:  Fall in garage today. Right hip pain. Initial encounter. EXAM: RIGHT FEMUR 1 VIEW COMPARISON:  None. FINDINGS: Mildly displaced right femoral neck fracture is seen. No evidence hip dislocation. No evidence of distal femur fracture. Generalized osteopenia noted. Old fracture  deformity of right inferior pubic ramus noted. Peripheral vascular calcification also demonstrated. IMPRESSION: Acute right femoral neck fracture. Electronically Signed   By: Earle Gell M.D.   On: 04/15/2015 19:01    Not all labs, radiology exams or other studies done during hospitalization come through on my EPIC note; however they are reviewed by me.    Assessment and Plan   #1 history of closed right hip fracture-she has done well with this followed by orthopedic she is now ambulating with a rolling walker will need a rolling walker as well as additional PT and OT at home.  She did still has some right lower extremity edema Doppler is been negative this appears to be slowly resolving.  2 history of atrial fibrillation her rate appears to be controlled she is on metoprolol as well as Coumadin and INR. Most recent lab has approach normalization update INR has been ordered this will need follow-up by home health as well as for primary care provider follow-up.--INR has been ordered for 05/08/2015  #3 chronic diastolic CHF-this appears stable on Aldactone she also is on a beta blocker.  #4 coronary artery disease-status post stenting to RCA in 2010-this appears stable no complaints of chest pain or shortness of breath.  #5 history of aortic valvular stenosis moderate to severe-continues to be asymptomatic.  #6 history of hypothyroidism goiter this-TSH has been low but free T4 within normal limits-she is on Synthroid-this will need follow-up on discharge as an outpatient studies have shown diffuse enlargement of the thyroid compatible with goiter with a hyper  Enhancing nodule right mid lobe.  #7 hyperlipidemia she is on Lipitor LDL 91 HDL 33 most recently.  #8 history of angioimmunoblastic lymphoma-she is followed by oncology.  #9 postop anemia due to acute blood loss hemoglobin appears to be stable at 9.9.  [id number] note greater than 30 minutes spent on this discharge summary-greater  than 50% of time spent coordinating plan of care for numerous diagnoses-prescriptions have been written            Io Dieujuste C,

## 2015-05-08 ENCOUNTER — Telehealth: Payer: Self-pay | Admitting: Pharmacist Clinician (PhC)/ Clinical Pharmacy Specialist

## 2015-05-08 NOTE — Telephone Encounter (Signed)
Pt recently d/c from [facility] rehab post hip fracture, needs to schedule appt for INR this week.  Appt set for Friday 2:40 pm, pt and spouse voiced understanding

## 2015-05-11 ENCOUNTER — Telehealth: Payer: Self-pay

## 2015-05-11 NOTE — Telephone Encounter (Signed)
error 

## 2015-05-12 ENCOUNTER — Ambulatory Visit (INDEPENDENT_AMBULATORY_CARE_PROVIDER_SITE_OTHER): Payer: Medicare Other | Admitting: Pharmacist Clinician (PhC)/ Clinical Pharmacy Specialist

## 2015-05-12 DIAGNOSIS — I480 Paroxysmal atrial fibrillation: Secondary | ICD-10-CM | POA: Diagnosis not present

## 2015-05-12 DIAGNOSIS — Z7901 Long term (current) use of anticoagulants: Secondary | ICD-10-CM | POA: Insufficient documentation

## 2015-05-12 LAB — POCT INR: INR: 5.2

## 2015-05-15 ENCOUNTER — Ambulatory Visit (INDEPENDENT_AMBULATORY_CARE_PROVIDER_SITE_OTHER): Payer: Medicare Other | Admitting: Family Medicine

## 2015-05-15 ENCOUNTER — Encounter: Payer: Self-pay | Admitting: Family Medicine

## 2015-05-15 VITALS — BP 120/70 | HR 70 | Resp 12 | Wt 128.2 lb

## 2015-05-15 DIAGNOSIS — Z8781 Personal history of (healed) traumatic fracture: Secondary | ICD-10-CM

## 2015-05-15 DIAGNOSIS — I480 Paroxysmal atrial fibrillation: Secondary | ICD-10-CM | POA: Diagnosis not present

## 2015-05-15 DIAGNOSIS — Z7901 Long term (current) use of anticoagulants: Secondary | ICD-10-CM | POA: Diagnosis not present

## 2015-05-15 DIAGNOSIS — I350 Nonrheumatic aortic (valve) stenosis: Secondary | ICD-10-CM

## 2015-05-15 DIAGNOSIS — D692 Other nonthrombocytopenic purpura: Secondary | ICD-10-CM

## 2015-05-15 NOTE — Progress Notes (Signed)
   Subjective:    Patient ID: [name], female    DOB: Feb 23, 1924, 81 y.o.   MRN: [id number]  HPI She is here for follow-up visit after recent hospitalization November 19 for fracture of the right hip.She did have it repaired and was sent to rehabilitation. She has now been home for approximately 2 weeks. She does have home health coming to help take care of her.she has a history of PAF and is on Coumadin therapy. Her last PT/INR was in the 5 range. 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 She also has a history of senile purpura which has gotten worse during the same timeframe.The medical record was reviewed. She does have several minor abnormalities on her blood work. She states that she is doing much better ano hip pain. She is slowly getting mor strength back. She is using a walker.   Review of Systems     Objective:   Physical Exam Alert and in no distress. Cardiac exam shows a grade 2 systolic murmur with an irregular rhythm. Good right hip motion is noted. Purpuric lesions noted on both arms but none on her lower extremities. The hospital record including discharge H&P, blood work was reviewed      Assessment & Plan:  Senile purpura ([place]) - Plan: CBC with Differential/Platelet  Aortic valvular stenosis, moderate to severe  History of fracture of right hip - Plan: CBC with Differential/Platelet, Comprehensive metabolic panel  PAF (paroxysmal atrial fibrillation) ([place])  Long term (current) use of anticoagulants [Z79.01] - Plan: CBC with Differential/Platelet, Comprehensive metabolic panel Urged her to continue with her home rehabilitation.

## 2015-05-16 LAB — CBC WITH DIFFERENTIAL/PLATELET
BASOS PCT: 0 % (ref 0–1)
Basophils Absolute: 0 10*3/uL (ref 0.0–0.1)
EOS ABS: 0 10*3/uL (ref 0.0–0.7)
Eosinophils Relative: 0 % (ref 0–5)
HCT: 32.7 % — ABNORMAL LOW (ref 36.0–46.0)
Hemoglobin: 10.7 g/dL — ABNORMAL LOW (ref 12.0–15.0)
Lymphocytes Relative: 24 % (ref 12–46)
Lymphs Abs: 2.5 10*3/uL (ref 0.7–4.0)
MCH: 29.1 pg (ref 26.0–34.0)
MCHC: 32.7 g/dL (ref 30.0–36.0)
MCV: 88.9 fL (ref 78.0–100.0)
MONO ABS: 0.5 10*3/uL (ref 0.1–1.0)
MONOS PCT: 5 % (ref 3–12)
MPV: 10.3 fL (ref 8.6–12.4)
NEUTROS PCT: 71 % (ref 43–77)
Neutro Abs: 7.5 10*3/uL (ref 1.7–7.7)
PLATELETS: 280 10*3/uL (ref 150–400)
RBC: 3.68 MIL/uL — ABNORMAL LOW (ref 3.87–5.11)
RDW: 14.4 % (ref 11.5–15.5)
WBC: 10.5 10*3/uL (ref 4.0–10.5)

## 2015-05-16 LAB — COMPREHENSIVE METABOLIC PANEL
ALBUMIN: 3.6 g/dL (ref 3.6–5.1)
ALT: 9 U/L (ref 6–29)
AST: 18 U/L (ref 10–35)
Alkaline Phosphatase: 95 U/L (ref 33–130)
BUN: 21 mg/dL (ref 7–25)
CHLORIDE: 109 mmol/L (ref 98–110)
CO2: 27 mmol/L (ref 20–31)
CREATININE: 0.93 mg/dL — AB (ref 0.60–0.88)
Calcium: 9.2 mg/dL (ref 8.6–10.4)
Glucose, Bld: 94 mg/dL (ref 65–99)
POTASSIUM: 4.4 mmol/L (ref 3.5–5.3)
SODIUM: 145 mmol/L (ref 135–146)
Total Bilirubin: 1 mg/dL (ref 0.2–1.2)
Total Protein: 6.5 g/dL (ref 6.1–8.1)

## 2015-05-19 ENCOUNTER — Ambulatory Visit (INDEPENDENT_AMBULATORY_CARE_PROVIDER_SITE_OTHER): Payer: Medicare Other | Admitting: Pharmacist Clinician (PhC)/ Clinical Pharmacy Specialist

## 2015-05-19 DIAGNOSIS — Z7901 Long term (current) use of anticoagulants: Secondary | ICD-10-CM | POA: Diagnosis not present

## 2015-05-19 DIAGNOSIS — I480 Paroxysmal atrial fibrillation: Secondary | ICD-10-CM | POA: Diagnosis not present

## 2015-05-19 LAB — POCT INR: INR: 2.1

## 2015-06-02 ENCOUNTER — Ambulatory Visit (INDEPENDENT_AMBULATORY_CARE_PROVIDER_SITE_OTHER): Payer: Medicare Other | Admitting: Pharmacist Clinician (PhC)/ Clinical Pharmacy Specialist

## 2015-06-02 DIAGNOSIS — Z7901 Long term (current) use of anticoagulants: Secondary | ICD-10-CM

## 2015-06-02 DIAGNOSIS — I480 Paroxysmal atrial fibrillation: Secondary | ICD-10-CM | POA: Diagnosis not present

## 2015-06-02 LAB — POCT INR: INR: 2.4

## 2015-06-23 ENCOUNTER — Ambulatory Visit (INDEPENDENT_AMBULATORY_CARE_PROVIDER_SITE_OTHER): Payer: Medicare Other | Admitting: Pharmacist Clinician (PhC)/ Clinical Pharmacy Specialist

## 2015-06-23 DIAGNOSIS — Z7901 Long term (current) use of anticoagulants: Secondary | ICD-10-CM

## 2015-06-23 DIAGNOSIS — I480 Paroxysmal atrial fibrillation: Secondary | ICD-10-CM | POA: Diagnosis not present

## 2015-06-23 LAB — POCT INR: INR: 2.4

## 2015-07-06 ENCOUNTER — Ambulatory Visit (HOSPITAL_COMMUNITY)
Admission: RE | Admit: 2015-07-06 | Discharge: 2015-07-06 | Disposition: A | Discharge status: Home / Self Care | Payer: Medicare Other | Source: Ambulatory Visit | Attending: Hematology and Oncology | Admitting: Hematology and Oncology

## 2015-07-06 ENCOUNTER — Other Ambulatory Visit (HOSPITAL_BASED_OUTPATIENT_CLINIC_OR_DEPARTMENT_OTHER): Payer: Medicare Other

## 2015-07-06 DIAGNOSIS — C8440 Peripheral T-cell lymphoma, not classified, unspecified site: Secondary | ICD-10-CM

## 2015-07-06 DIAGNOSIS — C8444 Peripheral T-cell lymphoma, not classified, lymph nodes of axilla and upper limb: Secondary | ICD-10-CM

## 2015-07-06 DIAGNOSIS — R590 Localized enlarged lymph nodes: Secondary | ICD-10-CM | POA: Diagnosis not present

## 2015-07-06 DIAGNOSIS — K8080 Other cholelithiasis without obstruction: Secondary | ICD-10-CM | POA: Insufficient documentation

## 2015-07-06 DIAGNOSIS — R918 Other nonspecific abnormal finding of lung field: Secondary | ICD-10-CM | POA: Insufficient documentation

## 2015-07-06 DIAGNOSIS — D259 Leiomyoma of uterus, unspecified: Secondary | ICD-10-CM | POA: Insufficient documentation

## 2015-07-06 LAB — LACTATE DEHYDROGENASE: LDH: 376 U/L — AB (ref 125–245)

## 2015-07-06 LAB — CBC WITH DIFFERENTIAL/PLATELET
BASO%: 0.3 % (ref 0.0–2.0)
Basophils Absolute: 0 10*3/uL (ref 0.0–0.1)
EOS%: 1.6 % (ref 0.0–7.0)
Eosinophils Absolute: 0.2 10*3/uL (ref 0.0–0.5)
HEMATOCRIT: 38.4 % (ref 34.8–46.6)
HGB: 11.9 g/dL (ref 11.6–15.9)
LYMPH#: 4.6 10*3/uL — AB (ref 0.9–3.3)
LYMPH%: 43 % (ref 14.0–49.7)
MCH: 28.3 pg (ref 25.1–34.0)
MCHC: 31 g/dL — AB (ref 31.5–36.0)
MCV: 91.2 fL (ref 79.5–101.0)
MONO#: 0.3 10*3/uL (ref 0.1–0.9)
MONO%: 2.9 % (ref 0.0–14.0)
NEUT#: 5.5 10*3/uL (ref 1.5–6.5)
NEUT%: 52.2 % (ref 38.4–76.8)
Platelets: 261 10*3/uL (ref 145–400)
RBC: 4.21 10*6/uL (ref 3.70–5.45)
RDW: 15 % — ABNORMAL HIGH (ref 11.2–14.5)
WBC: 10.6 10*3/uL — ABNORMAL HIGH (ref 3.9–10.3)

## 2015-07-06 LAB — COMPREHENSIVE METABOLIC PANEL
ALBUMIN: 3.7 g/dL (ref 3.5–5.0)
ALK PHOS: 102 U/L (ref 40–150)
ALT: 15 U/L (ref 0–55)
ANION GAP: 11 meq/L (ref 3–11)
AST: 21 U/L (ref 5–34)
BILIRUBIN TOTAL: 0.94 mg/dL (ref 0.20–1.20)
BUN: 24.6 mg/dL (ref 7.0–26.0)
CO2: 26 mEq/L (ref 22–29)
Calcium: 9.6 mg/dL (ref 8.4–10.4)
Chloride: 108 mEq/L (ref 98–109)
Creatinine: 0.9 mg/dL (ref 0.6–1.1)
EGFR: 58 mL/min/{1.73_m2} — AB (ref >90)
Glucose: 77 mg/dl (ref 70–140)
POTASSIUM: 3.9 meq/L (ref 3.5–5.1)
SODIUM: 145 meq/L (ref 136–145)
TOTAL PROTEIN: 7.5 g/dL (ref 6.4–8.3)

## 2015-07-06 LAB — TECHNOLOGIST REVIEW

## 2015-07-06 MED ORDER — IOHEXOL 300 MG/ML  SOLN
100.0000 mL | Freq: Once | INTRAMUSCULAR | Status: AC | PRN
  Administered 2015-07-06: 100 mL via INTRAVENOUS

## 2015-07-09 NOTE — Assessment & Plan Note (Signed)
Right axillary lymphadenopathy: Detected through with routine screening mammogram status post ultrasound-guided biopsy, 8 mm lymph nodes, atypical lymphoproliferative process suspicious for lymphoma, flow cytometry revealed excess CD4 positive cells suspicious for angioimmunoblastic T-cell lymphoma CT scans 03/29/15: Borderline bil axillary LN and inguinal LN Ct scans 07/06/15: Progressive bil Axillary and inguinal lymphadenopathy Hospitalization: 11/19 to 04/19/15: Closed Rt Hip Fracture  Plan:  1. I discussed with her that given her advanced age, I do not recommend aggressive chemotherapy. 2. Options were surveillance versus prednisone therapy. After much discussion patient chose to do surveillance without any additional medications.  RTC in 6 months

## 2015-07-10 ENCOUNTER — Encounter: Payer: Self-pay | Admitting: Hematology and Oncology

## 2015-07-10 ENCOUNTER — Ambulatory Visit (HOSPITAL_BASED_OUTPATIENT_CLINIC_OR_DEPARTMENT_OTHER): Payer: Medicare Other | Admitting: Hematology and Oncology

## 2015-07-10 VITALS — BP 125/52 | HR 98 | Temp 97.7°F | Resp 17 | Ht 63.0 in | Wt 127.5 lb

## 2015-07-10 DIAGNOSIS — C8440 Peripheral T-cell lymphoma, not classified, unspecified site: Secondary | ICD-10-CM | POA: Diagnosis not present

## 2015-07-10 NOTE — Progress Notes (Signed)
Patient Care Team: [name], MD as PCP - General (Family Medicine)  DIAGNOSIS: Angioimmunoblastic T-cell lymphoma.  CHIEF COMPLIANT: Follow-up of lymphoma after CT scans  INTERVAL HISTORY: [name] is a 82 year old with above-mentioned history of angioma immunoblastic B-cell lymphoma who is currently on surveillance. Recently she had a CT chest abdomen pelvis and is here to follow-up on the results. She was diagnosed with hip fracture and underwent surgery followed by rehabilitation at the nursing home. She was recovered from all of that and is able to walk without the help of any assistance. She is very happy about how well she recovered.  REVIEW OF SYSTEMS:   Constitutional: Denies fevers, chills or abnormal weight loss Eyes: Denies blurriness of vision Ears, nose, mouth, throat, and face: Denies mucositis or sore throat Respiratory: Denies cough, dyspnea or wheezes Cardiovascular: Denies palpitation, chest discomfort Gastrointestinal:  Denies nausea, heartburn or change in bowel habits Skin: Denies abnormal skin rashes Lymphatics: Denies new lymphadenopathy or easy bruising Neurological:Denies numbness, tingling or new weaknesses Behavioral/Psych: Mood is stable, no new changes  Extremities: No lower extremity edema Breast:  denies any pain or lumps or nodules in either breasts All other systems were reviewed with the patient and are negative.  I have reviewed the past medical history, past surgical history, social history and family history with the patient and they are unchanged from previous note.  ALLERGIES:  is allergic to edecrin; tape; furosemide; sulfa antibiotics; thiazide-type diuretics; and torsemide.  MEDICATIONS:  Current Outpatient Prescriptions  Medication Sig Dispense Refill  . acetaminophen (TYLENOL) 650 MG CR tablet Take 650 mg by mouth 3 (three) times daily. Morning, supper and bedtime    . albuterol (PROVENTIL HFA;VENTOLIN HFA) 108 (90 BASE)  MCG/ACT inhaler Inhale 2 puffs into the lungs every 6 (six) hours as needed for wheezing or shortness of breath.    Marland Kitchen atorvastatin (LIPITOR) 40 MG tablet TAKE 1 TABLET DAILY (Patient taking differently: TAKE 1 TABLET BY MOUTH DAILY WITH SUPPER) 90 tablet 3  . feeding supplement, ENSURE ENLIVE, (ENSURE ENLIVE) LIQD Take 237 mLs by mouth daily.    . fluticasone (FLONASE) 50 MCG/ACT nasal spray Place 2 sprays into both nostrils daily. Use as directed    . folic acid (FOLVITE) 1 MG tablet Take 2 tablets (2 mg total) by mouth daily. (Patient taking differently: Take 1 mg by mouth 2 (two) times daily with a meal. ) 180 tablet 3  . ipratropium (ATROVENT) 0.03 % nasal spray Place 2 sprays into both nostrils 2 (two) times daily. Use as directed    . levothyroxine (SYNTHROID, LEVOTHROID) 75 MCG tablet Take 1 tablet (75 mcg total) by mouth daily before breakfast. 30 tablet 2  . loratadine (CLARITIN) 10 MG tablet Take 10 mg by mouth daily with breakfast.     . metoprolol tartrate (LOPRESSOR) 25 MG tablet Take 2 tablets (50 mg total) by mouth 2 (two) times daily.    . Multiple Vitamin (MULTIVITAMIN WITH MINERALS) TABS tablet Take 1 tablet by mouth daily. Centrum Silver    . Polyvinyl Alcohol-Povidone (TEARS PLUS OP) Place 1 drop into both eyes at bedtime.     . senna-docusate (SENOKOT-S) 8.6-50 MG tablet Take 2 tablets by mouth at bedtime.    Marland Kitchen spironolactone (ALDACTONE) 25 MG tablet Take 1 tablet (25 mg total) by mouth daily. 90 tablet 1  . triamcinolone cream (KENALOG) 0.1 % Apply 1 application topically daily as needed (rash).     . warfarin (COUMADIN) 1 MG  tablet Take 2 tablets by mouth daily on 11/24-but on 11/23 take 3 mg coumdain for one dose only (Patient taking differently: 3 mg. Take 3 mg QD)    . warfarin (COUMADIN) 1 MG tablet Take 3 tablets (3 mg total) by mouth one time only at 6 PM. On 11/23 only     No current facility-administered medications for this visit.    PHYSICAL EXAMINATION: ECOG  PERFORMANCE STATUS: 1 - Symptomatic but completely ambulatory  Filed Vitals:   07/10/15 1026  BP: 125/52  Pulse: 98  Temp: 97.7 F (36.5 C)  Resp: 17   Filed Weights   07/10/15 1026  Weight: 127 lb 8 oz (57.834 kg)    GENERAL:alert, no distress and comfortable SKIN: skin color, texture, turgor are normal, no rashes or significant lesions EYES: normal, Conjunctiva are pink and non-injected, sclera clear OROPHARYNX:no exudate, no erythema and lips, buccal mucosa, and tongue normal  NECK: supple, thyroid normal size, non-tender, without nodularity LYMPH: Small palpable axillary and inguinal lymph nodes LUNGS: clear to auscultation and percussion with normal breathing effort HEART: regular rate & rhythm and no murmurs and no lower extremity edema ABDOMEN:abdomen soft, non-tender and normal bowel sounds MUSCULOSKELETAL:no cyanosis of digits and no clubbing  NEURO: alert & oriented x 3 with fluent speech, no focal motor/sensory deficits EXTREMITIES: No lower extremity edema  LABORATORY DATA:  I have reviewed the data as listed   Chemistry      Component Value Date/Time   NA 145 07/06/2015 0914   NA 145 05/15/2015 0001   K 3.9 07/06/2015 0914   K 4.4 05/15/2015 0001   CL 109 05/15/2015 0001   CO2 26 07/06/2015 0914   CO2 27 05/15/2015 0001   BUN 24.6 07/06/2015 0914   BUN 21 05/15/2015 0001   CREATININE 0.9 07/06/2015 0914   CREATININE 0.93* 05/15/2015 0001   CREATININE 0.90 04/19/2015 0540      Component Value Date/Time   CALCIUM 9.6 07/06/2015 0914   CALCIUM 9.2 05/15/2015 0001   ALKPHOS 102 07/06/2015 0914   ALKPHOS 95 05/15/2015 0001   AST 21 07/06/2015 0914   AST 18 05/15/2015 0001   ALT 15 07/06/2015 0914   ALT 9 05/15/2015 0001   BILITOT 0.94 07/06/2015 0914   BILITOT 1.0 05/15/2015 0001       Lab Results  Component Value Date   WBC 10.6* 07/06/2015   HGB 11.9 07/06/2015   HCT 38.4 07/06/2015   MCV 91.2 07/06/2015   PLT 261 07/06/2015   NEUTROABS  5.5 07/06/2015     ASSESSMENT & PLAN:  Angioimmunoblastic lymphoma (HCC) Right axillary lymphadenopathy: Detected through with routine screening mammogram status post ultrasound-guided biopsy, 8 mm lymph nodes, atypical lymphoproliferative process suspicious for lymphoma, flow cytometry revealed excess CD4 positive cells suspicious for angioimmunoblastic T-cell lymphoma CT scans 03/29/15: Borderline bil axillary LN and inguinal LN Ct scans 07/06/15: Progressive bil Axillary and inguinal lymphadenopathy  Hospitalization: 11/19 to 04/19/15: Closed Rt Hip Fracture  Radiology review: I discussed the result of the CT chest abdomen pelvis. The lymph nodes have slightly increased in size both in the inguinal and axillary areas. Patient is completely asymptomatic.  Plan:  1. I discussed with her that given her advanced age, I do not recommend aggressive chemotherapy. 2. Options were surveillance versus prednisone therapy. After much discussion patient chose to do surveillance  Because we are not planning to do any active treatment, I do not see any point in obtaining routine CT scans. I provided  her the list of symptoms that she needs to watch out for. She will call me if she has any of these problems. Otherwise we'll see her back in 6 months with CBC CMP and LDH.  RTC in 6 months   No orders of the defined types were placed in this encounter.   The patient has a good understanding of the overall plan. she agrees with it. she will call with any problems that may develop before the next visit here.   Rulon Eisenmenger, MD 07/10/2015

## 2015-07-11 ENCOUNTER — Telehealth: Payer: Self-pay | Admitting: Hematology and Oncology

## 2015-07-11 NOTE — Telephone Encounter (Signed)
Left message for patient re lab/fu 8/15. Schedule mailed.

## 2015-08-04 ENCOUNTER — Ambulatory Visit (INDEPENDENT_AMBULATORY_CARE_PROVIDER_SITE_OTHER): Payer: Medicare Other | Admitting: Pharmacist Clinician (PhC)/ Clinical Pharmacy Specialist

## 2015-08-04 DIAGNOSIS — I480 Paroxysmal atrial fibrillation: Secondary | ICD-10-CM | POA: Diagnosis not present

## 2015-08-04 DIAGNOSIS — Z7901 Long term (current) use of anticoagulants: Secondary | ICD-10-CM

## 2015-08-04 LAB — POCT INR: INR: 1.3

## 2015-08-04 MED ORDER — SPIRONOLACTONE 25 MG PO TABS: 25.0000 mg | ORAL_TABLET | Freq: Every day | ORAL | Status: DC

## 2015-08-04 MED ORDER — WARFARIN SODIUM 2.5 MG PO TABS: 2.5000 mg | ORAL_TABLET | Freq: Every day | ORAL | Status: DC

## 2015-08-18 ENCOUNTER — Ambulatory Visit (INDEPENDENT_AMBULATORY_CARE_PROVIDER_SITE_OTHER): Payer: Medicare Other | Admitting: Pharmacist Clinician (PhC)/ Clinical Pharmacy Specialist

## 2015-08-18 DIAGNOSIS — I480 Paroxysmal atrial fibrillation: Secondary | ICD-10-CM

## 2015-08-18 DIAGNOSIS — Z7901 Long term (current) use of anticoagulants: Secondary | ICD-10-CM

## 2015-08-18 LAB — POCT INR: INR: 1.2

## 2015-09-08 ENCOUNTER — Ambulatory Visit (INDEPENDENT_AMBULATORY_CARE_PROVIDER_SITE_OTHER): Payer: Medicare Other | Admitting: Pharmacist Clinician (PhC)/ Clinical Pharmacy Specialist

## 2015-09-08 DIAGNOSIS — Z7901 Long term (current) use of anticoagulants: Secondary | ICD-10-CM

## 2015-09-08 DIAGNOSIS — I480 Paroxysmal atrial fibrillation: Secondary | ICD-10-CM | POA: Diagnosis not present

## 2015-09-08 LAB — POCT INR: INR: 2.3

## 2015-09-08 MED ORDER — WARFARIN SODIUM 2.5 MG PO TABS: 2.5000 mg | ORAL_TABLET | Freq: Every day | ORAL | Status: DC

## 2015-10-02 ENCOUNTER — Ambulatory Visit (INDEPENDENT_AMBULATORY_CARE_PROVIDER_SITE_OTHER): Payer: Medicare Other | Admitting: Pharmacist Clinician (PhC)/ Clinical Pharmacy Specialist

## 2015-10-02 DIAGNOSIS — I480 Paroxysmal atrial fibrillation: Secondary | ICD-10-CM | POA: Diagnosis not present

## 2015-10-02 DIAGNOSIS — Z7901 Long term (current) use of anticoagulants: Secondary | ICD-10-CM

## 2015-10-02 LAB — POCT INR: INR: 2.3

## 2015-10-09 ENCOUNTER — Encounter: Payer: Medicare Other | Admitting: Pharmacist Clinician (PhC)/ Clinical Pharmacy Specialist

## 2015-10-31 ENCOUNTER — Ambulatory Visit (INDEPENDENT_AMBULATORY_CARE_PROVIDER_SITE_OTHER): Payer: Medicare Other | Admitting: Pharmacist Clinician (PhC)/ Clinical Pharmacy Specialist

## 2015-10-31 DIAGNOSIS — I480 Paroxysmal atrial fibrillation: Secondary | ICD-10-CM

## 2015-10-31 DIAGNOSIS — Z7901 Long term (current) use of anticoagulants: Secondary | ICD-10-CM | POA: Diagnosis not present

## 2015-10-31 LAB — POCT INR: INR: 2.4

## 2015-11-03 ENCOUNTER — Encounter (HOSPITAL_BASED_OUTPATIENT_CLINIC_OR_DEPARTMENT_OTHER): Payer: Self-pay | Admitting: Emergency Medicine

## 2015-11-03 ENCOUNTER — Emergency Department (HOSPITAL_BASED_OUTPATIENT_CLINIC_OR_DEPARTMENT_OTHER): Payer: Medicare Other

## 2015-11-03 ENCOUNTER — Emergency Department (HOSPITAL_BASED_OUTPATIENT_CLINIC_OR_DEPARTMENT_OTHER)
Admission: EM | Admit: 2015-11-03 | Discharge: 2015-11-03 | Disposition: A | Discharge status: Home / Self Care | Payer: Medicare Other | Attending: Emergency Medicine | Admitting: Emergency Medicine

## 2015-11-03 DIAGNOSIS — Y92009 Unspecified place in unspecified non-institutional (private) residence as the place of occurrence of the external cause: Secondary | ICD-10-CM | POA: Insufficient documentation

## 2015-11-03 DIAGNOSIS — I2510 Atherosclerotic heart disease of native coronary artery without angina pectoris: Secondary | ICD-10-CM | POA: Insufficient documentation

## 2015-11-03 DIAGNOSIS — Y9301 Activity, walking, marching and hiking: Secondary | ICD-10-CM | POA: Insufficient documentation

## 2015-11-03 DIAGNOSIS — E785 Hyperlipidemia, unspecified: Secondary | ICD-10-CM | POA: Insufficient documentation

## 2015-11-03 DIAGNOSIS — W010XXA Fall on same level from slipping, tripping and stumbling without subsequent striking against object, initial encounter: Secondary | ICD-10-CM | POA: Diagnosis not present

## 2015-11-03 DIAGNOSIS — Z79899 Other long term (current) drug therapy: Secondary | ICD-10-CM | POA: Diagnosis not present

## 2015-11-03 DIAGNOSIS — I480 Paroxysmal atrial fibrillation: Secondary | ICD-10-CM | POA: Diagnosis not present

## 2015-11-03 DIAGNOSIS — E039 Hypothyroidism, unspecified: Secondary | ICD-10-CM | POA: Insufficient documentation

## 2015-11-03 DIAGNOSIS — I110 Hypertensive heart disease with heart failure: Secondary | ICD-10-CM | POA: Insufficient documentation

## 2015-11-03 DIAGNOSIS — S52502A Unspecified fracture of the lower end of left radius, initial encounter for closed fracture: Secondary | ICD-10-CM | POA: Insufficient documentation

## 2015-11-03 DIAGNOSIS — Y999 Unspecified external cause status: Secondary | ICD-10-CM | POA: Diagnosis not present

## 2015-11-03 DIAGNOSIS — W19XXXA Unspecified fall, initial encounter: Secondary | ICD-10-CM

## 2015-11-03 DIAGNOSIS — M25532 Pain in left wrist: Secondary | ICD-10-CM | POA: Diagnosis present

## 2015-11-03 DIAGNOSIS — I5032 Chronic diastolic (congestive) heart failure: Secondary | ICD-10-CM | POA: Diagnosis not present

## 2015-11-03 DIAGNOSIS — M1990 Unspecified osteoarthritis, unspecified site: Secondary | ICD-10-CM | POA: Diagnosis not present

## 2015-11-03 MED ORDER — HYDROCODONE-ACETAMINOPHEN 5-325 MG PO TABS: 1.0000 | ORAL_TABLET | ORAL | Status: DC | PRN

## 2015-11-03 MED FILL — HYDROCODON-APAP 5-325: 5-325 | 1 days supply | Qty: 6 | Fill #0

## 2015-11-03 NOTE — Discharge Instructions (Signed)
Return to the ED with any concerns including increased pain, swelling/numbness/discoloration of fingers, or any other alarming symptoms

## 2015-11-03 NOTE — ED Notes (Signed)
MD at bedside. 

## 2015-11-03 NOTE — ED Notes (Signed)
Cap refill <3 sec to L fingertips at discharge, pt moves fingers well.

## 2015-11-03 NOTE — ED Notes (Signed)
Patient states that she tripped over a plastic chair in her hallway as she was walkin gout the door. The patient has a skin tear to her left hand and noted larges amount of swelling to her left wrist / hematoma - patient is on coumadin

## 2015-11-03 NOTE — ED Notes (Signed)
Pt states she tripped and fell catching herself with her arms. Pt denies hitting her head, denies LOC. Pt c/o L wrist pain with swelling and hematoma noted.

## 2015-11-03 NOTE — ED Provider Notes (Signed)
CSN: [id number]     Arrival date & time 11/03/15  1332 History   First MD Initiated Contact with Patient 11/03/15 1342     Chief Complaint  Patient presents with  . Fall     (Consider location/radiation/quality/duration/timing/severity/associated sxs/prior Treatment) HPI  Pt presenting with left wrist pain and swelling after fall.  She states she was walking out of her house and tripped over a plastic chair causing her to fall forward onto her outstretched hands.  She has a skin tear on left hand and swelling of left wrist.  She is on coumadin for paroxysmal atrial fibrillation.  Denies striking her head.  No fainting or LOC.  No other areas of pain.  She states she had her INR checked 2 days ago and it was 2.4.  She has not had any treatment prior to arrival.  There are no other associated systemic symptoms, there are no other alleviating or modifying factors.   Past Medical History  Diagnosis Date  . Chronic diastolic CHF (congestive heart failure) ([place])     a. 10/2012 Echo: EF 55-60%, no rwma, Gr 2 DD, moderate AS, mod dil LA, mildly to mod dil RA.  Marland Kitchen Hypothyroidism   . Dyslipidemia   . Hypertension   . PAF (paroxysmal atrial fibrillation) ([place])     a. CHA2DS2VASc = 7-->chronic coumadin;  b. s/p DCCV 05/2011, 05/2012, 07/2014;  c. On amio.  Marland Kitchen  Acute respiratiory failure requiring BiPap 06/24/2011  . Aortic valvular stenosis, moderate to severe 06/24/2011    a. 10/2012 Echo: mod AS, Valve 1.25 cm^2 (VTI), 1.19cm^2 (Vmax).  . Allergy   . Coronary artery disease     a. 03/2009 PCI RCA (3.0x18 BMS).  . Exertional shortness of breath   . Arthritis     "back" (10/30/2012)  . Rash     a. felt to be 2/2 lasix/torsemide in setting of sulfa allergy (lasix d/c ~ 06/2014, torsemide d/c 08/09/2014).   Past Surgical History  Procedure Laterality Date  . Cardioversion  06/19/2011    Procedure: CARDIOVERSION;  Surgeon: Sanda Klein, MD;  Location: [facility];  Service: Cardiovascular;  Laterality: N/A;  .  Appendectomy    . Angioplasty    . Cataract extraction w/ intraocular lens  implant, bilateral    . Lumbar disc surgery  X 2    "cleaned out scar tissue"   . Coronary angioplasty with stent placement      "1" (10/30/2012)  . Dilation and curettage of uterus    . Excisional hemorrhoidectomy    . Wrist fracture surgery Left   . Shoulder acromioplasty Left     "fell; put a new ball in" (10/30/2012)  . Cardioversion  07/11/2010    Successful coversion to sinus rhythm  . Cardiac catheterization  10/06/2009    Continue medical therapy  . Cardiac catheterization  04/07/2009    RCA stented with a 3x4m stent resulting in a reduction of 90% narrowing to normal  . Cardioversion N/A 08/10/2014    Procedure: CARDIOVERSION;  Surgeon: MSanda Klein MD;  Location: [facility];  Service: Cardiovascular;  Laterality: N/A;  . Back surgery    . Fracture surgery    . Hip arthroplasty Right 04/17/2015    Procedure: HIP MONOPOLAR HIP HEMI ARTHROPLASTY;  Surgeon: MMarybelle Killings MD;  [facility]  Service: Orthopedics;  Laterality: Right;   Family History  Problem Relation Age of Onset  . Heart disease Brother   . Heart disease Brother   .  Lung disease Brother   . Heart disease Mother 44  . Cancer Mother 25    Uterine  . Stroke Mother 40  . Heart disease Father   . Hypertension Child 34  . Alcohol abuse Child   . Heart attack Father   . Heart failure Mother   . Cancer Son   . Hypertension Mother   . Hypertension Father   . Hypertension Sister   . Hypertension Brother    Social History  Substance Use Topics  . Smoking status: Never Smoker   . Smokeless tobacco: Never Used  . Alcohol Use: No   OB History    No data available     Review of Systems  ROS reviewed and all otherwise negative except for mentioned in HPI    Allergies  [name]; Tape; Furosemide; Sulfa antibiotics; [name]; and Torsemide  Home Medications   Prior to Admission medications   Medication  Sig Start Date End Date Taking? Authorizing Provider  acetaminophen (TYLENOL) 650 MG CR tablet Take 650 mg by mouth 3 (three) times daily. Morning, supper and bedtime    Historical Provider, MD  albuterol (PROVENTIL HFA;VENTOLIN HFA) 108 (90 BASE) MCG/ACT inhaler Inhale 2 puffs into the lungs every 6 (six) hours as needed for wheezing or shortness of breath.    Historical Provider, MD  atorvastatin (LIPITOR) 40 MG tablet TAKE 1 TABLET DAILY Patient taking differently: TAKE 1 TABLET BY MOUTH DAILY WITH SUPPER 11/04/14   Mihai Croitoru, MD  feeding supplement, ENSURE ENLIVE, (ENSURE ENLIVE) LIQD Take 237 mLs by mouth daily. 04/19/15   Shanker Kristeen Mans, MD  fluticasone (FLONASE) 50 MCG/ACT nasal spray Place 2 sprays into both nostrils daily. Use as directed 11/24/14   Historical Provider, MD  folic acid (FOLVITE) 1 MG tablet Take 2 tablets (2 mg total) by mouth daily. Patient taking differently: Take 1 mg by mouth 2 (two) times daily with a meal.  03/20/15   Mihai Croitoru, MD  HYDROcodone-acetaminophen (NORCO/VICODIN) 5-325 MG tablet Take 1 tablet by mouth every 4 (four) hours as needed. 11/03/15   Alfonzo Beers, MD  ipratropium (ATROVENT) 0.03 % nasal spray Place 2 sprays into both nostrils 2 (two) times daily. Use as directed 11/24/14   Historical Provider, MD  levothyroxine (SYNTHROID, LEVOTHROID) 75 MCG tablet Take 1 tablet (75 mcg total) by mouth daily before breakfast. 02/09/15   Almyra Deforest, PA  loratadine (CLARITIN) 10 MG tablet Take 10 mg by mouth daily with breakfast.     Historical Provider, MD  metoprolol tartrate (LOPRESSOR) 25 MG tablet Take 2 tablets (50 mg total) by mouth 2 (two) times daily. 04/19/15   Shanker Kristeen Mans, MD  Multiple Vitamin (MULTIVITAMIN WITH MINERALS) TABS tablet Take 1 tablet by mouth daily. Centrum Silver    Historical Provider, MD  Polyvinyl Alcohol-Povidone (TEARS PLUS OP) Place 1 drop into both eyes at bedtime.     Historical Provider, MD  senna-docusate (SENOKOT-S)  8.6-50 MG tablet Take 2 tablets by mouth at bedtime. 04/19/15   Shanker Kristeen Mans, MD  spironolactone (ALDACTONE) 25 MG tablet Take 1 tablet (25 mg total) by mouth daily. 08/04/15   Mihai Croitoru, MD  traMADol (ULTRAM) 50 MG tablet Take 1 tablet (50 mg total) by mouth every 12 (twelve) hours as needed for severe pain. 11/06/15   Everlene Balls, MD  triamcinolone cream (KENALOG) 0.1 % Apply 1 application topically daily as needed (rash).  04/12/15   Historical Provider, MD  warfarin (COUMADIN) 2.5 MG tablet Take 1  tablet (2.5 mg total) by mouth daily. 09/08/15   Mihai Croitoru, MD   BP 173/104 mmHg  Pulse 87  Temp(Src) 98.2 F (36.8 C) (Oral)  Resp 16  Ht '5\' 3"'$  (1.6 m)  Wt 58.06 kg  BMI 22.68 kg/m2  SpO2 97%  Vitals reviewed Physical Exam  Physical Examination: General appearance - alert, well appearing, and in no distress Mental status - alert, oriented to person, place, and time Eyes - no conjunctival injection no scleral icterus Head- NCAT Mouth - mucous membranes moist, pharynx normal without lesions Neck - no midline tenderness Chest - clear to auscultation, no wheezes, rales or rhonchi, symmetric air entry Heart - normal rate, regular rhythm, normal S1, S2, no murmurs, rubs, clicks or gallops Back exam - full range of motion, no tenderness, palpable spasm or pain on motion Neurological - alert, oriented, normal speech, strength 5/5 in extremities x 4, sensation intact throughout and specifically distal to injury Musculoskeletal - left wrist with ventral area of swelling/hematoma, ttp over distal radius, otherwise no joint tenderness, deformity or swelling Extremities - peripheral pulses normal, no pedal edema, no clubbing or cyanosis Skin - normal coloration and turgor, no rashes,skin tear noted over dorsum of left hand  ED Course  Procedures (including critical care time) Labs Review Labs Reviewed - No data to display  Imaging Review No results found. I have personally reviewed  and evaluated these images and lab results as part of my medical decision-making.   EKG Interpretation None      MDM   Final diagnoses:  Distal radius fracture, left, closed, initial encounter  Fall, initial encounter    Pt presenting with c/o left wrist pain after fall forward.  She now has swelling of left wrist with large hematoma.  Xray shows distal radius fracture- hardware intact from prior surgery.  Pt placed in splint.  She does not want any pain medication other than tylenol.  I have given her a rx for hydrcodone and encouraged her to use it if pain becomes more intense.  Pt discharged with strict return precautions.  Mom agreeable with plan  Pt states she saw Dr. Alvan Dame for the surgery on left wrist initially- will have her followup with him.  Splint placed applied, fingers are NVI on recheck.   Alfonzo Beers, MD 11/10/15 [phone]

## 2015-11-05 ENCOUNTER — Encounter (HOSPITAL_BASED_OUTPATIENT_CLINIC_OR_DEPARTMENT_OTHER): Payer: Self-pay | Admitting: Emergency Medicine

## 2015-11-05 ENCOUNTER — Encounter (HOSPITAL_BASED_OUTPATIENT_CLINIC_OR_DEPARTMENT_OTHER): Payer: Self-pay | Admitting: *Deleted

## 2015-11-05 ENCOUNTER — Emergency Department (HOSPITAL_BASED_OUTPATIENT_CLINIC_OR_DEPARTMENT_OTHER)
Admission: EM | Admit: 2015-11-05 | Discharge: 2015-11-05 | Disposition: A | Discharge status: Home / Self Care | Payer: Medicare Other | Attending: Emergency Medicine | Admitting: Emergency Medicine

## 2015-11-05 ENCOUNTER — Emergency Department (HOSPITAL_BASED_OUTPATIENT_CLINIC_OR_DEPARTMENT_OTHER): Payer: Medicare Other

## 2015-11-05 DIAGNOSIS — W1839XA Other fall on same level, initial encounter: Secondary | ICD-10-CM

## 2015-11-05 DIAGNOSIS — W19XXXD Unspecified fall, subsequent encounter: Secondary | ICD-10-CM | POA: Diagnosis not present

## 2015-11-05 DIAGNOSIS — S52502A Unspecified fracture of the lower end of left radius, initial encounter for closed fracture: Secondary | ICD-10-CM

## 2015-11-05 DIAGNOSIS — S52602A Unspecified fracture of lower end of left ulna, initial encounter for closed fracture: Secondary | ICD-10-CM | POA: Insufficient documentation

## 2015-11-05 DIAGNOSIS — S4992XD Unspecified injury of left shoulder and upper arm, subsequent encounter: Secondary | ICD-10-CM | POA: Diagnosis present

## 2015-11-05 DIAGNOSIS — S52502D Unspecified fracture of the lower end of left radius, subsequent encounter for closed fracture with routine healing: Secondary | ICD-10-CM | POA: Diagnosis not present

## 2015-11-05 DIAGNOSIS — I5032 Chronic diastolic (congestive) heart failure: Secondary | ICD-10-CM | POA: Insufficient documentation

## 2015-11-05 DIAGNOSIS — Y939 Activity, unspecified: Secondary | ICD-10-CM | POA: Insufficient documentation

## 2015-11-05 DIAGNOSIS — Z79899 Other long term (current) drug therapy: Secondary | ICD-10-CM | POA: Diagnosis not present

## 2015-11-05 DIAGNOSIS — Y999 Unspecified external cause status: Secondary | ICD-10-CM | POA: Insufficient documentation

## 2015-11-05 DIAGNOSIS — I2510 Atherosclerotic heart disease of native coronary artery without angina pectoris: Secondary | ICD-10-CM | POA: Insufficient documentation

## 2015-11-05 DIAGNOSIS — E039 Hypothyroidism, unspecified: Secondary | ICD-10-CM | POA: Insufficient documentation

## 2015-11-05 DIAGNOSIS — I110 Hypertensive heart disease with heart failure: Secondary | ICD-10-CM | POA: Insufficient documentation

## 2015-11-05 DIAGNOSIS — Y929 Unspecified place or not applicable: Secondary | ICD-10-CM | POA: Insufficient documentation

## 2015-11-05 DIAGNOSIS — M1990 Unspecified osteoarthritis, unspecified site: Secondary | ICD-10-CM | POA: Insufficient documentation

## 2015-11-05 DIAGNOSIS — I480 Paroxysmal atrial fibrillation: Secondary | ICD-10-CM

## 2015-11-05 LAB — BASIC METABOLIC PANEL
ANION GAP: 6 (ref 5–15)
BUN: 27 mg/dL — ABNORMAL HIGH (ref 6–20)
CALCIUM: 8.8 mg/dL — AB (ref 8.9–10.3)
CO2: 25 mmol/L (ref 22–32)
CREATININE: 0.83 mg/dL (ref 0.44–1.00)
Chloride: 109 mmol/L (ref 101–111)
Glucose, Bld: 102 mg/dL — ABNORMAL HIGH (ref 65–99)
Potassium: 4 mmol/L (ref 3.5–5.1)
SODIUM: 140 mmol/L (ref 135–145)

## 2015-11-05 LAB — CBC WITH DIFFERENTIAL/PLATELET
BASOS ABS: 0 10*3/uL (ref 0.0–0.1)
BASOS PCT: 0 %
EOS ABS: 0.1 10*3/uL (ref 0.0–0.7)
Eosinophils Relative: 1 %
HEMATOCRIT: 34.4 % — AB (ref 36.0–46.0)
Hemoglobin: 10.7 g/dL — ABNORMAL LOW (ref 12.0–15.0)
LYMPHS ABS: 1.7 10*3/uL (ref 0.7–4.0)
Lymphocytes Relative: 18 %
MCH: 28.6 pg (ref 26.0–34.0)
MCHC: 31.1 g/dL (ref 30.0–36.0)
MCV: 92 fL (ref 78.0–100.0)
MONO ABS: 1.8 10*3/uL — AB (ref 0.1–1.0)
Monocytes Relative: 20 %
NEUTROS ABS: 5.6 10*3/uL (ref 1.7–7.7)
NEUTROS PCT: 61 %
PLATELETS: 219 10*3/uL (ref 150–400)
RBC: 3.74 MIL/uL — ABNORMAL LOW (ref 3.87–5.11)
RDW: 16.4 % — AB (ref 11.5–15.5)
WBC: 9.2 10*3/uL (ref 4.0–10.5)

## 2015-11-05 LAB — PROTIME-INR
INR: 1.97 — AB (ref 0.00–1.49)
Prothrombin Time: 22.3 seconds — ABNORMAL HIGH (ref 11.6–15.2)

## 2015-11-05 NOTE — Discharge Instructions (Signed)
You have stable fracture of your wrist. Keep arm elevated above the heart at rest to keep swelling down. Continue to take Tylenol as needed for pain.  Call the hand surgeon listed above for close follow-up.  Return without fail for worsening symptoms, including escalating pain, numbness or tingling of the finger/hand, or any other symptoms concerning to you.

## 2015-11-05 NOTE — ED Notes (Signed)
Pt was seen her Friday with left radial fx, pt returns with edema to left hand, bruising to left upper am proximal to temporary cast

## 2015-11-05 NOTE — ED Provider Notes (Signed)
CSN: [id number]     Arrival date & time 11/05/15  1111 History   First MD Initiated Contact with Patient 11/05/15 1123     Chief Complaint  Patient presents with  . Arm Injury     (Consider location/radiation/quality/duration/timing/severity/associated sxs/prior Treatment) HPI 82 year old female who presents for recheck of left wrist injury sustained 2 days ago. She was seen in the ED 2 days ago after mechanical fall onto the left wrist. Sustained a distal radius fracture, and placed in a splint with potential follow-up with orthopedic surgery. She has a history of paroxysmal atrial fibrillation on Coumadin. Did not have any history of syncope or near syncope or any head trauma. She today noticed that she has significant bruising above her splint in the left arm which is new. She has not had worsening pain, numbness or weakness. She has not had any recurrent injury.   Past Medical History  Diagnosis Date  . Chronic diastolic CHF (congestive heart failure) ([place])     a. 10/2012 Echo: EF 55-60%, no rwma, Gr 2 DD, moderate AS, mod dil LA, mildly to mod dil RA.  Marland Kitchen Hypothyroidism   . Dyslipidemia   . Hypertension   . PAF (paroxysmal atrial fibrillation) ([place])     a. CHA2DS2VASc = 7-->chronic coumadin;  b. s/p DCCV 05/2011, 05/2012, 07/2014;  c. On amio.  Marland Kitchen  Acute respiratiory failure requiring BiPap 06/24/2011  . Aortic valvular stenosis, moderate to severe 06/24/2011    a. 10/2012 Echo: mod AS, Valve 1.25 cm^2 (VTI), 1.19cm^2 (Vmax).  . Allergy   . Coronary artery disease     a. 03/2009 PCI RCA (3.0x18 BMS).  . Exertional shortness of breath   . Arthritis     "back" (10/30/2012)  . Rash     a. felt to be 2/2 lasix/torsemide in setting of sulfa allergy (lasix d/c ~ 06/2014, torsemide d/c 08/09/2014).   Past Surgical History  Procedure Laterality Date  . Cardioversion  06/19/2011    Procedure: CARDIOVERSION;  Surgeon: Sanda Klein, MD;  Location: [facility];  Service: Cardiovascular;  Laterality:  N/A;  . Appendectomy    . Angioplasty    . Cataract extraction w/ intraocular lens  implant, bilateral    . Lumbar disc surgery  X 2    "cleaned out scar tissue"   . Coronary angioplasty with stent placement      "1" (10/30/2012)  . Dilation and curettage of uterus    . Excisional hemorrhoidectomy    . Wrist fracture surgery Left   . Shoulder acromioplasty Left     "fell; put a new ball in" (10/30/2012)  . Cardioversion  07/11/2010    Successful coversion to sinus rhythm  . Cardiac catheterization  10/06/2009    Continue medical therapy  . Cardiac catheterization  04/07/2009    RCA stented with a 3x43m stent resulting in a reduction of 90% narrowing to normal  . Cardioversion N/A 08/10/2014    Procedure: CARDIOVERSION;  Surgeon: MSanda Klein MD;  Location: [facility];  Service: Cardiovascular;  Laterality: N/A;  . Back surgery    . Fracture surgery    . Hip arthroplasty Right 04/17/2015    Procedure: HIP MONOPOLAR HIP HEMI ARTHROPLASTY;  Surgeon: MMarybelle Killings MD;  [place] [facility]  Service: Orthopedics;  Laterality: Right;   Family History  Problem Relation Age of Onset  . Heart disease Brother   . Heart disease Brother   . Lung disease Brother   . Heart disease Mother 622 .  Cancer Mother 23    Uterine  . Stroke Mother 20  . Heart disease Father   . Hypertension Child 34  . Alcohol abuse Child   . Heart attack Father   . Heart failure Mother   . Cancer Son   . Hypertension Mother   . Hypertension Father   . Hypertension Sister   . Hypertension Brother    Social History  Substance Use Topics  . Smoking status: Never Smoker   . Smokeless tobacco: Never Used  . Alcohol Use: No   OB History    No data available     Review of Systems 10/14 systems reviewed and are negative other than those stated in the HPI    Allergies  [name]; Tape; Furosemide; Sulfa antibiotics; [name]; and Torsemide  Home Medications   Prior to Admission medications    Medication Sig Start Date End Date Taking? Authorizing Provider  acetaminophen (TYLENOL) 650 MG CR tablet Take 650 mg by mouth 3 (three) times daily. Morning, supper and bedtime    Historical Provider, MD  albuterol (PROVENTIL HFA;VENTOLIN HFA) 108 (90 BASE) MCG/ACT inhaler Inhale 2 puffs into the lungs every 6 (six) hours as needed for wheezing or shortness of breath.    Historical Provider, MD  atorvastatin (LIPITOR) 40 MG tablet TAKE 1 TABLET DAILY Patient taking differently: TAKE 1 TABLET BY MOUTH DAILY WITH SUPPER 11/04/14   Mihai Croitoru, MD  feeding supplement, ENSURE ENLIVE, (ENSURE ENLIVE) LIQD Take 237 mLs by mouth daily. 04/19/15   Shanker Kristeen Mans, MD  fluticasone (FLONASE) 50 MCG/ACT nasal spray Place 2 sprays into both nostrils daily. Use as directed 11/24/14   Historical Provider, MD  folic acid (FOLVITE) 1 MG tablet Take 2 tablets (2 mg total) by mouth daily. Patient taking differently: Take 1 mg by mouth 2 (two) times daily with a meal.  03/20/15   Mihai Croitoru, MD  HYDROcodone-acetaminophen (NORCO/VICODIN) 5-325 MG tablet Take 1 tablet by mouth every 4 (four) hours as needed. 11/03/15   Alfonzo Beers, MD  ipratropium (ATROVENT) 0.03 % nasal spray Place 2 sprays into both nostrils 2 (two) times daily. Use as directed 11/24/14   Historical Provider, MD  levothyroxine (SYNTHROID, LEVOTHROID) 75 MCG tablet Take 1 tablet (75 mcg total) by mouth daily before breakfast. 02/09/15   Almyra Deforest, PA  loratadine (CLARITIN) 10 MG tablet Take 10 mg by mouth daily with breakfast.     Historical Provider, MD  metoprolol tartrate (LOPRESSOR) 25 MG tablet Take 2 tablets (50 mg total) by mouth 2 (two) times daily. 04/19/15   Shanker Kristeen Mans, MD  Multiple Vitamin (MULTIVITAMIN WITH MINERALS) TABS tablet Take 1 tablet by mouth daily. Centrum Silver    Historical Provider, MD  Polyvinyl Alcohol-Povidone (TEARS PLUS OP) Place 1 drop into both eyes at bedtime.     Historical Provider, MD  senna-docusate  (SENOKOT-S) 8.6-50 MG tablet Take 2 tablets by mouth at bedtime. 04/19/15   Shanker Kristeen Mans, MD  spironolactone (ALDACTONE) 25 MG tablet Take 1 tablet (25 mg total) by mouth daily. 08/04/15   Mihai Croitoru, MD  triamcinolone cream (KENALOG) 0.1 % Apply 1 application topically daily as needed (rash).  04/12/15   Historical Provider, MD  warfarin (COUMADIN) 2.5 MG tablet Take 1 tablet (2.5 mg total) by mouth daily. 09/08/15   Mihai Croitoru, MD   BP 105/61 mmHg  Pulse 67  Temp(Src) 97.9 F (36.6 C) (Oral)  Resp 18  Ht '5\' 3"'$  (1.6 m)  Wt 128  lb (58.06 kg)  BMI 22.68 kg/m2  SpO2 98% Physical Exam Physical Exam  Nursing note and vitals reviewed. Constitutional: Well developed, well nourished, non-toxic, and in no acute distress Head: Normocephalic and atraumatic.  Mouth/Throat: Oropharynx is clear and moist.  Neck: Normal range of motion. Neck supple.  Cardiovascular: Normal rate and regular rhythm.   Pulmonary/Chest: Effort normal and breath sounds normal.  Abdominal: Soft. There is no tenderness. There is no rebound and no guarding.  Musculoskeletal: Soft tissue swelling and bruising noted to the left hand up to the left forearm. Compartments are still soft. She has +2 radial pulse of the left upper extremity. Intact inervation involving the median, ulnar, and radial nerves of the left hand. With a skin tear over the dorsum of the left hand.  Neurological: Alert, no facial droop, fluent speech, moves all extremities symmetrically Skin: Skin is warm and dry.  Psychiatric: Cooperative  ED Course  Procedures (including critical care time) Labs Review Labs Reviewed  CBC WITH DIFFERENTIAL/PLATELET - Abnormal; Notable for the following:    RBC 3.74 (*)    Hemoglobin 10.7 (*)    HCT 34.4 (*)    RDW 16.4 (*)    Monocytes Absolute 1.8 (*)    All other components within normal limits  BASIC METABOLIC PANEL - Abnormal; Notable for the following:    Glucose, Bld 102 (*)    BUN 27 (*)     Calcium 8.8 (*)    All other components within normal limits  PROTIME-INR - Abnormal; Notable for the following:    Prothrombin Time 22.3 (*)    INR 1.97 (*)    All other components within normal limits    Imaging Review Dg Wrist Complete Left  11/05/2015  CLINICAL DATA:  Follow-up fracture.  Increased swelling. EXAM: LEFT WRIST - COMPLETE 3+ VIEW COMPARISON:  November 03, 2015 FINDINGS: The distal ulnar and radius fractures are again seen and unchanged. Surgical hardware is in stable position. Soft tissue swelling appears to be decreased in the forearm and wrist but increased in the hand in the interval. No fractures are seen in the bones of the hand however. IMPRESSION: Persistent fractures of the distal radius and ulna with decreasing soft tissue swelling in the forearm and wrist but increasing soft tissue swelling in the hand. The cause for the increased soft tissue swelling in the hand is not seen on this study. Electronically Signed   By: Dorise Bullion III M.D   On: 11/05/2015 12:23   Dg Wrist Complete Left  11/03/2015  CLINICAL DATA:  Pain after fall. EXAM: LEFT WRIST - COMPLETE 3+ VIEW COMPARISON:  April 30, 2014 FINDINGS: Significant soft tissue swelling is seen around the wrist. There is a fracture through the distal radius best seen on the AP and lateral views. Deformity of the distal ulna is stable and chronic. Plate and screws over the distal radius are stable. A screw through the scaphoid is stable. Diffuse osteopenia is identified. No other fractures are identified. No dislocation. IMPRESSION: Distal radius fracture. Electronically Signed   By: Dorise Bullion III M.D   On: 11/03/2015 14:12   I have personally reviewed and evaluated these images and lab results as part of my medical decision-making.   EKG Interpretation None      MDM   Final diagnoses:  Distal radius fracture, left, closed, with routine healing, subsequent encounter    With left distal radius fracture  sustained 2 days ago and returns with bruising up her  arm which she noticed today. Likely delayed bruising in the setting of Coumadin usage from her fall 2 days ago. She did not reinjure it. Splint was removed and she does have soft tissue swelling and bruising along the hand and forearm. Compartments are still soft. Her pain has been well-controlled with Tylenol at home. It is neurovascularly intact. Not concerned for compartment syndrome at this time. X-rays show stable fracture. Stable hemoglobin. She has slightly subtherapeutic INR of 1.97 but will continue taking her Coumadin as prescribed. She will recheck this with her primary care doctor. Discussed close follow-up with the hand surgeon regarding further definitive management. Given instructions to return and warning signs for compartment syndrome. Strict return and follow-up instructions reviewed. She expressed understanding of all discharge instructions and felt comfortable with the plan of care.     Forde Dandy, MD 11/05/15 1315

## 2015-11-05 NOTE — ED Notes (Signed)
Splint removed. EDP at bedside. Skin tear present to top of hand minimal drainage present. Edema and bruising near radial pulse -radial pulse present weak to palpation. Pt unable to bend fingers into ok sign.

## 2015-11-05 NOTE — ED Notes (Signed)
Pt reports back to the ED for eval of her left arm injury.  Reports increased pain and swelling.  Pt splint removed in triage due to swelling.

## 2015-11-06 ENCOUNTER — Emergency Department (HOSPITAL_BASED_OUTPATIENT_CLINIC_OR_DEPARTMENT_OTHER)
Admission: EM | Admit: 2015-11-06 | Discharge: 2015-11-06 | Disposition: A | Discharge status: Home / Self Care | Payer: Medicare Other | Source: Home / Self Care | Attending: Emergency Medicine | Admitting: Emergency Medicine

## 2015-11-06 DIAGNOSIS — S62102A Fracture of unspecified carpal bone, left wrist, initial encounter for closed fracture: Secondary | ICD-10-CM

## 2015-11-06 MED ORDER — TRAMADOL HCL 50 MG PO TABS: 50.0000 mg | ORAL_TABLET | Freq: Two times a day (BID) | ORAL | Status: DC | PRN

## 2015-11-06 MED ORDER — TRAMADOL HCL 50 MG PO TABS
50.0000 mg | ORAL_TABLET | Freq: Once | ORAL | Status: AC
  Administered 2015-11-06: 50 mg via ORAL
  Filled 2015-11-06: qty 1

## 2015-11-06 NOTE — ED Notes (Signed)
CMS intact before and after. Pt tolerated well. Pt had no questions.

## 2015-11-06 NOTE — ED Notes (Signed)
Pt states her arm feels better after velcro splint placed by EMT. Instructed pt to keep her arm elevated and instructions given to patient regarding splint care. Pt and family voiced understanding.

## 2015-11-06 NOTE — Discharge Instructions (Signed)
Wrist Fracture [name], you have not been able to tolerate a split so we have placed you in a velcro wrap.  It is very important for you to see your orthopaedic surgeon first thing in the morning for further care.  Any delay can result in worsening of your fracture.  Take tramadol for severe pain.  Come back to the ED immediately for any worsening. Thank you. A wrist fracture is a break or crack in one of the bones of your wrist. Your wrist is made up of eight small bones at the palm of your hand (carpal bones) and two long bones that make up your forearm (radius and ulna). The goal of treatment is to hold the injured bone in place while it heals. Surgery may or may not be needed to care for your injured wrist.  HOME CARE  Keep your injured wrist raised (elevated). Move your fingers as much as you can.  Do not put pressure on any part of your cast or splint. It may break.  Use a plastic bag to protect your cast or splint from water while bathing or showering. Do not lower your cast or splint into water.  Take medicines only as told by your doctor.  Keep your cast or splint clean and dry. If it gets wet, damaged, or suddenly feels too tight, tell your doctor right away.  Do not use any tobacco products including cigarettes, chewing tobacco, or electronic cigarettes. Tobacco can slow bone healing. If you need help quitting, ask your doctor.  Keep all follow-up visits as told by your doctor. This is important.  Ask your doctor if you should take supplements of calcium and vitamins C and D. GET HELP IF:   Your cast or splint is damaged, breaks, or gets wet.  You have a fever.  You have chills.  You have very bad pain that does not go away.  You have more swelling (inflammation) than before the cast was put on. GET HELP RIGHT AWAY IF:   Your hand or fingernails on the injured arm turn blue or gray, or feel cold or numb.  You lose some feeling in the fingers of your injured  arm. MAKE SURE YOU:   Understand these instructions.  Will watch your condition.  Will get help right away if you are not doing well or get worse.   This information is not intended to replace advice given to you by your health care provider. Make sure you discuss any questions you have with your health care provider.   Document Released: 10/30/2007 Document Revised: 06/03/2014 Document Reviewed: 11/25/2011 Elsevier Interactive Patient Education [employer].

## 2015-11-06 NOTE — ED Provider Notes (Signed)
CSN: [id number]     Arrival date & time 11/05/15  2154 History  By signing my name below, I, Soijett Blue, attest that this documentation has been prepared under the direction and in the presence of Everlene Balls, MD. Electronically Signed: Soijett Blue, ED Scribe. 11/06/2015. 12:42 AM.   Chief Complaint  Patient presents with  . Arm Injury      The history is provided by the patient, the spouse and a relative. No language interpreter was used.    [name] is a 82 y.o. female with a medical hx of CHF, PAF, who presents to the Emergency Department complaining of left arm injury occuring 2 days ago. Pt reports that she stepped out onto porch when she fell and injured her left arm. Pt states that she is back in the ED for increased pain and swelling to her left arm. Denies repeat falls or injury. Pt lives at home with her husband. Pt is having associated symptoms of left arm bruising. She notes that she has tried tylenol with her last dose being 3 hours ago with no relief of her symptoms. Pt is unsure of the dosage of her tylenol. She denies any other symptoms. Pt takes coumadin.    Past Medical History  Diagnosis Date  . Chronic diastolic CHF (congestive heart failure) ([place])     a. 10/2012 Echo: EF 55-60%, no rwma, Gr 2 DD, moderate AS, mod dil LA, mildly to mod dil RA.  Marland Kitchen Hypothyroidism   . Dyslipidemia   . Hypertension   . PAF (paroxysmal atrial fibrillation) ([place])     a. CHA2DS2VASc = 7-->chronic coumadin;  b. s/p DCCV 05/2011, 05/2012, 07/2014;  c. On amio.  Marland Kitchen  Acute respiratiory failure requiring BiPap 06/24/2011  . Aortic valvular stenosis, moderate to severe 06/24/2011    a. 10/2012 Echo: mod AS, Valve 1.25 cm^2 (VTI), 1.19cm^2 (Vmax).  . Allergy   . Coronary artery disease     a. 03/2009 PCI RCA (3.0x18 BMS).  . Exertional shortness of breath   . Arthritis     "back" (10/30/2012)  . Rash     a. felt to be 2/2 lasix/torsemide in setting of sulfa allergy (lasix d/c ~ 06/2014,  torsemide d/c 08/09/2014).   Past Surgical History  Procedure Laterality Date  . Cardioversion  06/19/2011    Procedure: CARDIOVERSION;  Surgeon: Sanda Klein, MD;  Location: [facility];  Service: Cardiovascular;  Laterality: N/A;  . Appendectomy    . Angioplasty    . Cataract extraction w/ intraocular lens  implant, bilateral    . Lumbar disc surgery  X 2    "cleaned out scar tissue"   . Coronary angioplasty with stent placement      "1" (10/30/2012)  . Dilation and curettage of uterus    . Excisional hemorrhoidectomy    . Wrist fracture surgery Left   . Shoulder acromioplasty Left     "fell; put a new ball in" (10/30/2012)  . Cardioversion  07/11/2010    Successful coversion to sinus rhythm  . Cardiac catheterization  10/06/2009    Continue medical therapy  . Cardiac catheterization  04/07/2009    RCA stented with a 3x62m stent resulting in a reduction of 90% narrowing to normal  . Cardioversion N/A 08/10/2014    Procedure: CARDIOVERSION;  Surgeon: MSanda Klein MD;  Location: [facility];  Service: Cardiovascular;  Laterality: N/A;  . Back surgery    . Fracture surgery    . Hip arthroplasty Right 04/17/2015  Procedure: HIP MONOPOLAR HIP HEMI ARTHROPLASTY;  Surgeon: Marybelle Killings, MD;  Location: [facility];  Service: Orthopedics;  Laterality: Right;   Family History  Problem Relation Age of Onset  . Heart disease Brother   . Heart disease Brother   . Lung disease Brother   . Heart disease Mother 64  . Cancer Mother 63    Uterine  . Stroke Mother 31  . Heart disease Father   . Hypertension Child 34  . Alcohol abuse Child   . Heart attack Father   . Heart failure Mother   . Cancer Son   . Hypertension Mother   . Hypertension Father   . Hypertension Sister   . Hypertension Brother    Social History  Substance Use Topics  . Smoking status: Never Smoker   . Smokeless tobacco: Never Used  . Alcohol Use: No   OB History    No data available     Review of Systems  A  complete 10 system review of systems was obtained and all systems are negative except as noted in the HPI and PMH.   Allergies  [name]; Tape; Furosemide; Sulfa antibiotics; [name]; and Torsemide  Home Medications   Prior to Admission medications   Medication Sig Start Date End Date Taking? Authorizing Provider  acetaminophen (TYLENOL) 650 MG CR tablet Take 650 mg by mouth 3 (three) times daily. Morning, supper and bedtime    Historical Provider, MD  albuterol (PROVENTIL HFA;VENTOLIN HFA) 108 (90 BASE) MCG/ACT inhaler Inhale 2 puffs into the lungs every 6 (six) hours as needed for wheezing or shortness of breath.    Historical Provider, MD  atorvastatin (LIPITOR) 40 MG tablet TAKE 1 TABLET DAILY Patient taking differently: TAKE 1 TABLET BY MOUTH DAILY WITH SUPPER 11/04/14   Mihai Croitoru, MD  feeding supplement, ENSURE ENLIVE, (ENSURE ENLIVE) LIQD Take 237 mLs by mouth daily. 04/19/15   Shanker Kristeen Mans, MD  fluticasone (FLONASE) 50 MCG/ACT nasal spray Place 2 sprays into both nostrils daily. Use as directed 11/24/14   Historical Provider, MD  folic acid (FOLVITE) 1 MG tablet Take 2 tablets (2 mg total) by mouth daily. Patient taking differently: Take 1 mg by mouth 2 (two) times daily with a meal.  03/20/15   Mihai Croitoru, MD  HYDROcodone-acetaminophen (NORCO/VICODIN) 5-325 MG tablet Take 1 tablet by mouth every 4 (four) hours as needed. 11/03/15   Alfonzo Beers, MD  ipratropium (ATROVENT) 0.03 % nasal spray Place 2 sprays into both nostrils 2 (two) times daily. Use as directed 11/24/14   Historical Provider, MD  levothyroxine (SYNTHROID, LEVOTHROID) 75 MCG tablet Take 1 tablet (75 mcg total) by mouth daily before breakfast. 02/09/15   Almyra Deforest, PA  loratadine (CLARITIN) 10 MG tablet Take 10 mg by mouth daily with breakfast.     Historical Provider, MD  metoprolol tartrate (LOPRESSOR) 25 MG tablet Take 2 tablets (50 mg total) by mouth 2 (two) times daily. 04/19/15   Shanker Kristeen Mans, MD  Multiple Vitamin (MULTIVITAMIN WITH MINERALS) TABS tablet Take 1 tablet by mouth daily. Centrum Silver    Historical Provider, MD  Polyvinyl Alcohol-Povidone (TEARS PLUS OP) Place 1 drop into both eyes at bedtime.     Historical Provider, MD  senna-docusate (SENOKOT-S) 8.6-50 MG tablet Take 2 tablets by mouth at bedtime. 04/19/15   Shanker Kristeen Mans, MD  spironolactone (ALDACTONE) 25 MG tablet Take 1 tablet (25 mg total) by mouth daily. 08/04/15   Sanda Klein, MD  triamcinolone  cream (KENALOG) 0.1 % Apply 1 application topically daily as needed (rash).  04/12/15   Historical Provider, MD  warfarin (COUMADIN) 2.5 MG tablet Take 1 tablet (2.5 mg total) by mouth daily. 09/08/15   Mihai Croitoru, MD   BP 145/87 mmHg  Pulse 103  Temp(Src) 97.9 F (36.6 C) (Oral)  Resp 16  Wt 128 lb (58.06 kg)  SpO2 98% Physical Exam  Constitutional: She is oriented to person, place, and time. She appears well-developed and well-nourished. No distress.  HENT:  Head: Normocephalic and atraumatic.  Nose: Nose normal.  Mouth/Throat: Oropharynx is clear and moist. No oropharyngeal exudate.  Eyes: Conjunctivae and EOM are normal. Pupils are equal, round, and reactive to light. No scleral icterus.  Neck: Normal range of motion. Neck supple. No JVD present. No tracheal deviation present. No thyromegaly present.  Cardiovascular: Normal rate, regular rhythm and normal heart sounds.  Exam reveals no gallop and no friction rub.   No murmur heard. Pulmonary/Chest: Effort normal and breath sounds normal. No respiratory distress. She has no wheezes. She exhibits no tenderness.  Abdominal: Soft. Bowel sounds are normal. She exhibits no distension and no mass. There is no tenderness. There is no rebound and no guarding.  Musculoskeletal: Normal range of motion. She exhibits no edema or tenderness.  LUE is in a sling with significant bruising and ecchymosis. Skin tear above left wrist. TTP at fracture site.    Lymphadenopathy:    She has no cervical adenopathy.  Neurological: She is alert and oriented to person, place, and time. No cranial nerve deficit. She exhibits normal muscle tone.  Skin: Skin is warm and dry. No rash noted. No erythema. No pallor.  Nursing note and vitals reviewed.   ED Course  Procedures (including critical care time) DIAGNOSTIC STUDIES: Oxygen Saturation is 98% on RA, nl by my interpretation.    COORDINATION OF CARE: 12:39 AM Discussed treatment plan with pt at bedside and pt agreed to plan.    Labs Review Labs Reviewed - No data to display  Imaging Review Dg Wrist Complete Left  11/05/2015  CLINICAL DATA:  Follow-up fracture.  Increased swelling. EXAM: LEFT WRIST - COMPLETE 3+ VIEW COMPARISON:  November 03, 2015 FINDINGS: The distal ulnar and radius fractures are again seen and unchanged. Surgical hardware is in stable position. Soft tissue swelling appears to be decreased in the forearm and wrist but increased in the hand in the interval. No fractures are seen in the bones of the hand however. IMPRESSION: Persistent fractures of the distal radius and ulna with decreasing soft tissue swelling in the forearm and wrist but increasing soft tissue swelling in the hand. The cause for the increased soft tissue swelling in the hand is not seen on this study. Electronically Signed   By: Dorise Bullion III M.D   On: 11/05/2015 12:23   I have personally reviewed and evaluated these images as part of my medical decision-making.   EKG Interpretation None      MDM   Final diagnoses:  None   Patient presents to the ED for Recurrent pain in her arm. She states the pain was relieved when the splint was removed once again. It is clear she will not tolerate a splint as the last one was placed very lightly on her arm. She states she is able to see her orthopedic surgeon tomorrow morning. We'll place her in a Velcro splint for now. She is refusing any pain medication stronger than  Tylenol, it may be  that she just does not have good pain control. I did convince her to take tramadol and provided her with a  prescription. She appears well-developed acute distress, vital signs were within her normal limits and she is safe for discharge.    I personally performed the services described in this documentation, which was scribed in my presence. The recorded information has been reviewed and is accurate.     Everlene Balls, MD 11/06/15 [phone]

## 2015-11-06 NOTE — ED Notes (Signed)
MD at bedside. 

## 2015-12-13 ENCOUNTER — Ambulatory Visit (INDEPENDENT_AMBULATORY_CARE_PROVIDER_SITE_OTHER): Payer: Medicare Other | Admitting: Pharmacist

## 2015-12-13 DIAGNOSIS — Z7901 Long term (current) use of anticoagulants: Secondary | ICD-10-CM

## 2015-12-13 DIAGNOSIS — I480 Paroxysmal atrial fibrillation: Secondary | ICD-10-CM | POA: Diagnosis not present

## 2015-12-13 LAB — POCT INR: INR: 3.4

## 2015-12-18 ENCOUNTER — Other Ambulatory Visit: Payer: Self-pay | Admitting: Cardiovascular Disease

## 2015-12-18 ENCOUNTER — Other Ambulatory Visit: Payer: Self-pay

## 2015-12-18 ENCOUNTER — Telehealth: Payer: Self-pay

## 2015-12-18 MED ORDER — WARFARIN SODIUM 2.5 MG PO TABS: 2.5000 mg | ORAL_TABLET | Freq: Every day | ORAL | 1 refills | Status: DC

## 2015-12-18 MED ORDER — LEVOTHYROXINE SODIUM 75 MCG PO TABS: 75.0000 ug | ORAL_TABLET | Freq: Every day | ORAL | 2 refills | Status: DC

## 2015-12-18 NOTE — Telephone Encounter (Signed)
Faxed refill request rcvd from [facility] for Synthroid 26mg. /[name]

## 2015-12-19 ENCOUNTER — Other Ambulatory Visit: Payer: Self-pay

## 2015-12-19 MED ORDER — LEVOTHYROXINE SODIUM 75 MCG PO TABS: 75.0000 ug | ORAL_TABLET | Freq: Every day | ORAL | 2 refills | Status: DC

## 2016-01-09 ENCOUNTER — Other Ambulatory Visit (HOSPITAL_BASED_OUTPATIENT_CLINIC_OR_DEPARTMENT_OTHER): Payer: Medicare Other

## 2016-01-09 ENCOUNTER — Telehealth: Payer: Self-pay | Admitting: Hematology and Oncology

## 2016-01-09 ENCOUNTER — Ambulatory Visit (HOSPITAL_BASED_OUTPATIENT_CLINIC_OR_DEPARTMENT_OTHER): Payer: Medicare Other | Admitting: Hematology and Oncology

## 2016-01-09 ENCOUNTER — Encounter: Payer: Self-pay | Admitting: Hematology and Oncology

## 2016-01-09 DIAGNOSIS — C8440 Peripheral T-cell lymphoma, not classified, unspecified site: Secondary | ICD-10-CM | POA: Diagnosis not present

## 2016-01-09 DIAGNOSIS — C865 Angioimmunoblastic T-cell lymphoma: Secondary | ICD-10-CM

## 2016-01-09 LAB — COMPREHENSIVE METABOLIC PANEL
ALBUMIN: 3.4 g/dL — AB (ref 3.5–5.0)
ALT: 14 U/L (ref 0–55)
AST: 18 U/L (ref 5–34)
Alkaline Phosphatase: 97 U/L (ref 40–150)
Anion Gap: 7 mEq/L (ref 3–11)
BILIRUBIN TOTAL: 0.67 mg/dL (ref 0.20–1.20)
BUN: 30.6 mg/dL — ABNORMAL HIGH (ref 7.0–26.0)
CO2: 26 meq/L (ref 22–29)
CREATININE: 0.9 mg/dL (ref 0.6–1.1)
Calcium: 9.6 mg/dL (ref 8.4–10.4)
Chloride: 111 mEq/L — ABNORMAL HIGH (ref 98–109)
EGFR: 56 mL/min/{1.73_m2} — ABNORMAL LOW (ref >90)
GLUCOSE: 97 mg/dL (ref 70–140)
Potassium: 4.2 mEq/L (ref 3.5–5.1)
SODIUM: 144 meq/L (ref 136–145)
TOTAL PROTEIN: 7.2 g/dL (ref 6.4–8.3)

## 2016-01-09 LAB — CBC WITH DIFFERENTIAL/PLATELET
BASO%: 0.3 % (ref 0.0–2.0)
BASOS ABS: 0 10*3/uL (ref 0.0–0.1)
EOS ABS: 0.2 10*3/uL (ref 0.0–0.5)
EOS%: 1.8 % (ref 0.0–7.0)
HCT: 35.9 % (ref 34.8–46.6)
HEMOGLOBIN: 11.2 g/dL — AB (ref 11.6–15.9)
LYMPH%: 25.1 % (ref 14.0–49.7)
MCH: 28.6 pg (ref 25.1–34.0)
MCHC: 31.2 g/dL — ABNORMAL LOW (ref 31.5–36.0)
MCV: 91.6 fL (ref 79.5–101.0)
MONO#: 0.4 10*3/uL (ref 0.1–0.9)
MONO%: 4.7 % (ref 0.0–14.0)
NEUT#: 6.2 10*3/uL (ref 1.5–6.5)
NEUT%: 68.1 % (ref 38.4–76.8)
NRBC: 0 % (ref 0–0)
PLATELETS: 260 10*3/uL (ref 145–400)
RBC: 3.92 10*6/uL (ref 3.70–5.45)
RDW: 14.9 % — ABNORMAL HIGH (ref 11.2–14.5)
WBC: 9.1 10*3/uL (ref 3.9–10.3)
lymph#: 2.3 10*3/uL (ref 0.9–3.3)

## 2016-01-09 LAB — TECHNOLOGIST REVIEW

## 2016-01-09 LAB — LACTATE DEHYDROGENASE: LDH: 287 U/L — ABNORMAL HIGH (ref 125–245)

## 2016-01-09 NOTE — Progress Notes (Signed)
Patient Care Team: [name], MD as PCP - General (Family Medicine)  DIAGNOSIS: No matching staging information was found for the patient.  SUMMARY OF ONCOLOGIC HISTORY:   Angioimmunoblastic lymphoma ([place])   03/16/2015 Initial Diagnosis    Left x-ray lymph node biopsy: Atypical lymphoid proliferation suspicious for lymphoma, flow cytometry revealed T cells CD3, CD43 and CD5 associated with BCL-2 expression, suspicious for angioimmunoblastic T-cell lymphoma     03/29/2015 Imaging     Borderline bil axillary LN and inguinal LN     04/15/2015 - 04/19/2015 Hospital Admission    Closed Rt Hip Fracture     07/06/2015 Imaging    Progressive bil Axillary and inguinal lymphadenopathy       CHIEF COMPLIANT:  follow-up of angioimmunoblastic lymphoma  INTERVAL HISTORY: [name] is a 82 year old with above-mentioned history of A1 and axillary lymphadenopathy biopsy suggestive of angioimmunoblastic T-cell lymphoma. She is here for six-month follow-up and reports that her health has been relatively stable. She had fallen and had a mild partial fracture in the wrist bone. She denies any fevers chills night sweats or weight loss. She denies any groin pain or axillary pain.  REVIEW OF SYSTEMS:   Constitutional: Denies fevers, chills or abnormal weight loss Eyes: Denies blurriness of vision Ears, nose, mouth, throat, and face: Denies mucositis or sore throat Respiratory: Denies cough, dyspnea or wheezes Cardiovascular: Denies palpitation, chest discomfort Gastrointestinal:  Denies nausea, heartburn or change in bowel habits Skin: Denies abnormal skin rashes Lymphatics: Bilateral inguinal lymphadenopathy right inguinal lymph node at least 3-4 cm in size and left inguinal lymph node 1-2 cm in size small palpable axillary lymphadenopathy. Neurological:Denies numbness, tingling or new weaknesses Behavioral/Psych: Mood is stable, no new changes  Extremities: No lower extremity edema  All  other systems were reviewed with the patient and are negative.  I have reviewed the past medical history, past surgical history, social history and family history with the patient and they are unchanged from previous note.  ALLERGIES:  is allergic to edecrin [ethacrynic acid]; tape; furosemide; sulfa antibiotics; thiazide-type diuretics; and torsemide.  MEDICATIONS:  Current Outpatient Prescriptions  Medication Sig Dispense Refill  . acetaminophen (TYLENOL) 650 MG CR tablet Take 650 mg by mouth 3 (three) times daily. Morning, supper and bedtime    . albuterol (PROVENTIL HFA;VENTOLIN HFA) 108 (90 BASE) MCG/ACT inhaler Inhale 2 puffs into the lungs every 6 (six) hours as needed for wheezing or shortness of breath.    Marland Kitchen atorvastatin (LIPITOR) 40 MG tablet TAKE 1 TABLET DAILY 90 tablet 2  . ezetimibe (ZETIA) 10 MG tablet TAKE 1 TABLET DAILY 90 tablet 0  . feeding supplement, ENSURE ENLIVE, (ENSURE ENLIVE) LIQD Take 237 mLs by mouth daily.    . fluticasone (FLONASE) 50 MCG/ACT nasal spray Place 2 sprays into both nostrils daily. Use as directed    . folic acid (FOLVITE) 1 MG tablet Take 2 tablets (2 mg total) by mouth daily. (Patient taking differently: Take 1 mg by mouth 2 (two) times daily with a meal. ) 180 tablet 3  . HYDROcodone-acetaminophen (NORCO/VICODIN) 5-325 MG tablet Take 1 tablet by mouth every 4 (four) hours as needed. 6 tablet 0  . ipratropium (ATROVENT) 0.03 % nasal spray Place 2 sprays into both nostrils 2 (two) times daily. Use as directed    . levothyroxine (SYNTHROID, LEVOTHROID) 75 MCG tablet Take 1 tablet (75 mcg total) by mouth daily before breakfast. 30 tablet 2  . loratadine (CLARITIN) 10 MG tablet Take  10 mg by mouth daily with breakfast.     . metoprolol tartrate (LOPRESSOR) 25 MG tablet Take 2 tablets (50 mg total) by mouth 2 (two) times daily.    . Multiple Vitamin (MULTIVITAMIN WITH MINERALS) TABS tablet Take 1 tablet by mouth daily. Centrum Silver    . Polyvinyl  Alcohol-Povidone (TEARS PLUS OP) Place 1 drop into both eyes at bedtime.     . senna-docusate (SENOKOT-S) 8.6-50 MG tablet Take 2 tablets by mouth at bedtime.    Marland Kitchen spironolactone (ALDACTONE) 25 MG tablet Take 1 tablet (25 mg total) by mouth daily. 90 tablet 3  . traMADol (ULTRAM) 50 MG tablet Take 1 tablet (50 mg total) by mouth every 12 (twelve) hours as needed for severe pain. 10 tablet 0  . triamcinolone cream (KENALOG) 0.1 % Apply 1 application topically daily as needed (rash).     . warfarin (COUMADIN) 2.5 MG tablet Take 1 tablet (2.5 mg total) by mouth daily. 90 tablet 1   No current facility-administered medications for this visit.     PHYSICAL EXAMINATION: ECOG PERFORMANCE STATUS: 1 - Symptomatic but completely ambulatory  Vitals:   01/09/16 1105  BP: (!) 142/81  Pulse: 62  Resp: 18  Temp: 97.7 F (36.5 C)   Filed Weights   01/09/16 1105  Weight: 134 lb 1.6 oz (60.8 kg)    GENERAL:alert, no distress and comfortable SKIN: skin color, texture, turgor are normal, no rashes or significant lesions EYES: normal, Conjunctiva are pink and non-injected, sclera clear OROPHARYNX:no exudate, no erythema and lips, buccal mucosa, and tongue normal  NECK: supple, thyroid normal size, non-tender, without nodularity LYMPH:  Bilateral inguinal lymphadenopathy as mentioned above LUNGS: clear to auscultation and percussion with normal breathing effort HEART: regular rate & rhythm and no murmurs and no lower extremity edema ABDOMEN:abdomen soft, non-tender and normal bowel sounds MUSCULOSKELETAL:no cyanosis of digits and no clubbing  NEURO: alert & oriented x 3 with fluent speech, no focal motor/sensory deficits EXTREMITIES: No lower extremity edema  LABORATORY DATA:  I have reviewed the data as listed   Chemistry      Component Value Date/Time   NA 144 01/09/2016 1040   K 4.2 01/09/2016 1040   CL 109 11/05/2015 1216   CO2 26 01/09/2016 1040   BUN 30.6 (H) 01/09/2016 1040    CREATININE 0.9 01/09/2016 1040      Component Value Date/Time   CALCIUM 9.6 01/09/2016 1040   ALKPHOS 97 01/09/2016 1040   AST 18 01/09/2016 1040   ALT 14 01/09/2016 1040   BILITOT 0.67 01/09/2016 1040       Lab Results  Component Value Date   WBC 9.1 01/09/2016   HGB 11.2 (L) 01/09/2016   HCT 35.9 01/09/2016   MCV 91.6 01/09/2016   PLT 260 01/09/2016   NEUTROABS 6.2 01/09/2016     ASSESSMENT & PLAN:  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 The lymph nodes have slightly increased in size both in the inguinal and axillary areas. Patient is completely asymptomatic.  Plan:  1. I discussed with her that  given her advanced age, I do not recommend aggressive chemotherapy. 2. Options were surveillance versus prednisone therapy. We will continue to do surveillance  Return to clinic in 6 months with CBC, CMP and LDH   Orders Placed This Encounter  Procedures  . CBC with Differential    Standing Status:   Future    Standing Expiration Date:   01/08/2017  . Comprehensive metabolic panel    Standing Status:   Future    Standing Expiration Date:   01/08/2017  . Lactate dehydrogenase (LDH)    Standing Status:   Future    Standing Expiration Date:   01/08/2017   The patient has a good understanding of the overall plan. she agrees with it. she will call with any problems that may develop before the next visit here.   Rulon Eisenmenger, MD 01/09/16

## 2016-01-09 NOTE — Assessment & Plan Note (Signed)
Right axillary lymphadenopathy: Detected through with routine screening mammogram status post ultrasound-guided biopsy, 8 mm lymph nodes, atypical lymphoproliferative process suspicious for lymphoma, flow cytometry revealed excess CD4 positive cells suspicious for angioimmunoblastic T-cell lymphoma CT scans 03/29/15: Borderline bil axillary LN and inguinal LN Ct scans 07/06/15: Progressive bil Axillary and inguinal lymphadenopathy  Hospitalization: 11/19 to 04/19/15: Closed Rt Hip Fracture  Radiology review: I discussed the result of the CT chest abdomen pelvis. The lymph nodes have slightly increased in size both in the inguinal and axillary areas. Patient is completely asymptomatic.  Plan:  1. I discussed with [name] that given [name] advanced age, I do not recommend aggressive chemotherapy. 2. Options were surveillance versus prednisone therapy. After much discussion patient chose to do surveillance  Return to clinic in 6 months with CBC, CMP and LDH

## 2016-01-09 NOTE — Telephone Encounter (Signed)
appt made and avs printed °

## 2016-01-18 ENCOUNTER — Ambulatory Visit (INDEPENDENT_AMBULATORY_CARE_PROVIDER_SITE_OTHER): Payer: Medicare Other | Admitting: Pharmacist Clinician (PhC)/ Clinical Pharmacy Specialist

## 2016-01-18 DIAGNOSIS — Z7901 Long term (current) use of anticoagulants: Secondary | ICD-10-CM | POA: Diagnosis not present

## 2016-01-18 DIAGNOSIS — I480 Paroxysmal atrial fibrillation: Secondary | ICD-10-CM | POA: Diagnosis not present

## 2016-01-18 LAB — POCT INR: INR: 2

## 2016-02-07 ENCOUNTER — Other Ambulatory Visit: Payer: Self-pay | Admitting: Cardiovascular Disease

## 2016-02-08 ENCOUNTER — Other Ambulatory Visit (INDEPENDENT_AMBULATORY_CARE_PROVIDER_SITE_OTHER): Payer: Medicare Other

## 2016-02-08 DIAGNOSIS — Z23 Encounter for immunization: Secondary | ICD-10-CM | POA: Diagnosis not present

## 2016-02-20 ENCOUNTER — Ambulatory Visit (INDEPENDENT_AMBULATORY_CARE_PROVIDER_SITE_OTHER): Payer: Medicare Other | Admitting: Family Medicine

## 2016-02-20 VITALS — BP 120/74 | HR 94 | Wt 136.4 lb

## 2016-02-20 DIAGNOSIS — I5032 Chronic diastolic (congestive) heart failure: Secondary | ICD-10-CM | POA: Diagnosis not present

## 2016-02-20 DIAGNOSIS — I480 Paroxysmal atrial fibrillation: Secondary | ICD-10-CM | POA: Diagnosis not present

## 2016-02-20 DIAGNOSIS — M1990 Unspecified osteoarthritis, unspecified site: Secondary | ICD-10-CM | POA: Diagnosis not present

## 2016-02-20 MED ORDER — IPRATROPIUM BROMIDE 0.03 % NA SOLN: 2.0000 | Freq: Two times a day (BID) | NASAL | 5 refills | Status: DC

## 2016-02-20 NOTE — Progress Notes (Signed)
   Subjective:    Patient ID: [name], female    DOB: 1923-08-03, 82 y.o.   MRN: [id number]  HPI she is here for consultation concerning getting a handicap sticker. She does have underlying arthritis as well as atrial fibrillation and heart failure. She states that she cannot walk more than 200 feet without stopping to rest. Otherwise she is doing quite well. She is followed by oncology for lymphoma.  Review of Systems     Objective:   Physical Exam Alert and in no distress. Cardiac exam shows regular rhythm without murmurs or gallops.        Assessment & Plan:  Arthritis  PAF (paroxysmal atrial fibrillation) ([facility])  Chronic diastolic heart failure ([place]) She certainly qualifies for handicap sticker and I will sign the appropriate forms.

## 2016-02-23 ENCOUNTER — Other Ambulatory Visit: Payer: Self-pay | Admitting: Cardiovascular Disease

## 2016-02-27 ENCOUNTER — Ambulatory Visit (INDEPENDENT_AMBULATORY_CARE_PROVIDER_SITE_OTHER): Payer: Medicare Other | Admitting: Pharmacist Clinician (PhC)/ Clinical Pharmacy Specialist

## 2016-02-27 DIAGNOSIS — I480 Paroxysmal atrial fibrillation: Secondary | ICD-10-CM

## 2016-02-27 LAB — POCT INR: INR: 1.7

## 2016-03-16 ENCOUNTER — Other Ambulatory Visit: Payer: Self-pay | Admitting: Cardiovascular Disease

## 2016-03-19 ENCOUNTER — Ambulatory Visit (INDEPENDENT_AMBULATORY_CARE_PROVIDER_SITE_OTHER): Payer: Medicare Other | Admitting: Pharmacist Clinician (PhC)/ Clinical Pharmacy Specialist

## 2016-03-19 DIAGNOSIS — I480 Paroxysmal atrial fibrillation: Secondary | ICD-10-CM

## 2016-03-19 DIAGNOSIS — Z7901 Long term (current) use of anticoagulants: Secondary | ICD-10-CM

## 2016-03-19 LAB — POCT INR: INR: 1.9

## 2016-04-10 ENCOUNTER — Ambulatory Visit (INDEPENDENT_AMBULATORY_CARE_PROVIDER_SITE_OTHER): Payer: Medicare Other | Admitting: Pharmacist

## 2016-04-10 DIAGNOSIS — Z7901 Long term (current) use of anticoagulants: Secondary | ICD-10-CM | POA: Diagnosis not present

## 2016-04-10 DIAGNOSIS — I480 Paroxysmal atrial fibrillation: Secondary | ICD-10-CM

## 2016-04-10 LAB — POCT INR: INR: 1.4

## 2016-05-10 ENCOUNTER — Encounter: Payer: Self-pay | Admitting: Cardiovascular Disease

## 2016-05-10 ENCOUNTER — Ambulatory Visit (INDEPENDENT_AMBULATORY_CARE_PROVIDER_SITE_OTHER): Payer: Medicare Other | Admitting: Pharmacist

## 2016-05-10 ENCOUNTER — Ambulatory Visit (INDEPENDENT_AMBULATORY_CARE_PROVIDER_SITE_OTHER): Payer: Medicare Other | Admitting: Cardiovascular Disease

## 2016-05-10 VITALS — BP 124/68 | HR 64 | Ht 63.5 in | Wt 138.8 lb

## 2016-05-10 DIAGNOSIS — I110 Hypertensive heart disease with heart failure: Secondary | ICD-10-CM

## 2016-05-10 DIAGNOSIS — I5032 Chronic diastolic (congestive) heart failure: Secondary | ICD-10-CM

## 2016-05-10 DIAGNOSIS — Z79899 Other long term (current) drug therapy: Secondary | ICD-10-CM | POA: Diagnosis not present

## 2016-05-10 DIAGNOSIS — I480 Paroxysmal atrial fibrillation: Secondary | ICD-10-CM

## 2016-05-10 DIAGNOSIS — I350 Nonrheumatic aortic (valve) stenosis: Secondary | ICD-10-CM

## 2016-05-10 DIAGNOSIS — E7800 Pure hypercholesterolemia, unspecified: Secondary | ICD-10-CM

## 2016-05-10 DIAGNOSIS — Z7901 Long term (current) use of anticoagulants: Secondary | ICD-10-CM

## 2016-05-10 DIAGNOSIS — I2510 Atherosclerotic heart disease of native coronary artery without angina pectoris: Secondary | ICD-10-CM

## 2016-05-10 LAB — COMPREHENSIVE METABOLIC PANEL
ALBUMIN: 4 g/dL (ref 3.6–5.1)
ALT: 13 U/L (ref 6–29)
AST: 22 U/L (ref 10–35)
Alkaline Phosphatase: 90 U/L (ref 33–130)
BILIRUBIN TOTAL: 0.7 mg/dL (ref 0.2–1.2)
BUN: 25 mg/dL (ref 7–25)
CALCIUM: 9.5 mg/dL (ref 8.6–10.4)
CHLORIDE: 106 mmol/L (ref 98–110)
CO2: 20 mmol/L (ref 20–31)
Creat: 1.3 mg/dL — ABNORMAL HIGH (ref 0.60–0.88)
Glucose, Bld: 87 mg/dL (ref 65–99)
POTASSIUM: 4.8 mmol/L (ref 3.5–5.3)
Sodium: 142 mmol/L (ref 135–146)
Total Protein: 7 g/dL (ref 6.1–8.1)

## 2016-05-10 LAB — CBC
HEMATOCRIT: 38.1 % (ref 35.0–45.0)
HEMOGLOBIN: 12 g/dL (ref 11.7–15.5)
MCH: 27.9 pg (ref 27.0–33.0)
MCHC: 31.5 g/dL — AB (ref 32.0–36.0)
MCV: 88.6 fL (ref 80.0–100.0)
MPV: 9.9 fL (ref 7.5–12.5)
Platelets: 269 10*3/uL (ref 140–400)
RBC: 4.3 MIL/uL (ref 3.80–5.10)
RDW: 14.8 % (ref 11.0–15.0)
WBC: 4.5 10*3/uL (ref 3.8–10.8)

## 2016-05-10 LAB — POCT INR: INR: 3.2

## 2016-05-10 LAB — LIPID PANEL
CHOL/HDL RATIO: 3.9 ratio (ref <5.0)
Cholesterol: 156 mg/dL (ref <200)
HDL: 40 mg/dL — AB (ref >50)
LDL Cholesterol: 88 mg/dL (ref <100)
Triglycerides: 141 mg/dL (ref <150)
VLDL: 28 mg/dL (ref <30)

## 2016-05-10 NOTE — Progress Notes (Signed)
Cardiology Office Note    Date:  05/11/2016   ID:  GERMAINE Evans, DOB Jul 10, 1923, MRN [id number]  PCP:  Wyatt Haste, MD  Cardiologist:   Sanda Klein, MD   Chief Complaint  Patient presents with  . Follow-up  . Edema    pt states both ankles but the right one has more    History of Present Illness:  [name] is a 82 y.o. female with chronic diastolic heart failure, coronary artery disease, persistent atrial fibrillation and hyperlipidemia returning for follow-up visit.  She has generally done well. She is in atrial fibrillation today with controlled rate and appears oblivious to the arrhythmia. She has not had neurological events or bleeding complications. She has had some difficulty maintaining prothrombin time in the therapeutic range.  She has mild ankle swelling, consistently more likely to be present in her right ankle, the limbic where she suffered a hip fracture. She denies orthopnea, PND, angina pectoris, syncope or palpitations.  She is tolerating treatment with spironolactone. Unfortunately she had a severe rash with sulfa drugs that prevented the use of both thiazides and furosemide and had nausea and vomiting with ethacrynic acid.  She has coronary artery disease and received a bare-metal stent (3x18 mm integrity) to the right coronary in 2010. She has not had significant angina pectoris since.  She has diastolic heart failure with a remote acute exacerbation in June 2014 and March 2016. Her echo shows an ejection fraction of 55-60% and pseudo-normal mitral inflow.  She has moderate aortic stenosis (estimated valve area 1.2 cm square, mean gradient 18 mm Hg, echo Sep 2016). She has a history of recurrent paroxysmal atrial fibrillation and has required cardioversion three times in the past. She is on chronic warfarin anticoagulation and has not had stroke or bleeding complications.  After her last cardioversion in March 2016, despite a higher dose of  amiodarone she still reverted to atrial fibrillation and we are now treating this conservatively, with rate control. She has treated hyperlipidemia, hypertension and hypothyroidism. 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  Past Medical History:  Diagnosis Date  .  Acute respiratiory failure requiring BiPap 06/24/2011  . Allergy   . Aortic valvular stenosis, moderate to severe 06/24/2011   a. 10/2012 Echo: mod AS, Valve 1.25 cm^2 (VTI), 1.19cm^2 (Vmax).  . Arthritis    "back" (10/30/2012)  . Chronic diastolic CHF (congestive heart failure) ([place])    a. 10/2012 Echo: EF 55-60%, no rwma, Gr 2 DD, moderate AS, mod dil LA, mildly to mod dil RA.  Marland Kitchen Coronary artery disease    a. 03/2009 PCI RCA (3.0x18 BMS).  . Dyslipidemia   . Exertional shortness of breath   . Hypertension   . Hypothyroidism   . PAF (paroxysmal atrial fibrillation) ([place])    a. CHA2DS2VASc = 7-->chronic coumadin;  b. s/p DCCV 05/2011, 05/2012, 07/2014;  c. On amio.  . Rash    a. felt to be 2/2 lasix/torsemide in setting of sulfa allergy (lasix d/c ~ 06/2014, torsemide d/c 08/09/2014).    Past Surgical History:  Procedure Laterality Date  . ANGIOPLASTY    . APPENDECTOMY    . BACK SURGERY    . CARDIAC CATHETERIZATION  10/06/2009   Continue medical therapy  . CARDIAC CATHETERIZATION  04/07/2009   RCA stented with a 3x109m stent resulting in a reduction of 90% narrowing to normal  . CARDIOVERSION  06/19/2011   Procedure: CARDIOVERSION;  Surgeon: MSanda Klein MD;  [facility];  Service: Cardiovascular;  Laterality: N/A;  . CARDIOVERSION  07/11/2010   Successful coversion to sinus rhythm  . CARDIOVERSION N/A 08/10/2014   Procedure: CARDIOVERSION;  Surgeon: Sanda Klein, MD;  Location: [facility];  Service: Cardiovascular;  Laterality: N/A;  . CATARACT EXTRACTION W/ INTRAOCULAR LENS  IMPLANT, BILATERAL    . CORONARY ANGIOPLASTY WITH STENT PLACEMENT     "1" (10/30/2012)  . DILATION AND  CURETTAGE OF UTERUS    . EXCISIONAL HEMORRHOIDECTOMY    . FRACTURE SURGERY    . HIP ARTHROPLASTY Right 04/17/2015   Procedure: HIP MONOPOLAR HIP HEMI ARTHROPLASTY;  Surgeon: Marybelle Killings, MD;  Location: [facility];  Service: Orthopedics;  Laterality: Right;  . LUMBAR DISC SURGERY  X 2   "cleaned out scar tissue"   . SHOULDER ACROMIOPLASTY Left    "fell; put a new ball in" (10/30/2012)  . WRIST FRACTURE SURGERY Left     Current Medications: Outpatient Medications Prior to Visit  Medication Sig Dispense Refill  . acetaminophen (TYLENOL) 650 MG CR tablet Take 650 mg by mouth 3 (three) times daily. Morning, supper and bedtime    . albuterol (PROVENTIL HFA;VENTOLIN HFA) 108 (90 BASE) MCG/ACT inhaler Inhale 2 puffs into the lungs every 6 (six) hours as needed for wheezing or shortness of breath.    Marland Kitchen atorvastatin (LIPITOR) 40 MG tablet TAKE 1 TABLET DAILY 90 tablet 2  . folic acid (FOLVITE) 1 MG tablet TAKE 2 TABLETS DAILY 180 tablet 3  . ipratropium (ATROVENT) 0.03 % nasal spray Place 2 sprays into both nostrils 2 (two) times daily. Use as directed 30 mL 5  . levothyroxine (SYNTHROID, LEVOTHROID) 75 MCG tablet Take 1 tablet (75 mcg total) by mouth daily before breakfast. 30 tablet 2  . loratadine (CLARITIN) 10 MG tablet Take 10 mg by mouth daily with breakfast.     . metoprolol tartrate (LOPRESSOR) 25 MG tablet Take 2 tablets (50 mg total) by mouth 2 (two) times daily.    . metoprolol tartrate (LOPRESSOR) 25 MG tablet TAKE 1 TABLET TWICE A DAY 120 tablet 0  . Multiple Vitamin (MULTIVITAMIN WITH MINERALS) TABS tablet Take 1 tablet by mouth daily. Centrum Silver    . spironolactone (ALDACTONE) 25 MG tablet Take 1 tablet (25 mg total) by mouth daily. 90 tablet 3  . triamcinolone cream (KENALOG) 0.1 % Apply 1 application topically daily as needed (rash).     . warfarin (COUMADIN) 2.5 MG tablet Take 1 tablet (2.5 mg total) by mouth daily. 90 tablet 1  . ezetimibe (ZETIA) 10 MG tablet Take 1 tablet (10 mg  total) by mouth daily. Please make appointment for future refills. 90 tablet 0  . Polyvinyl Alcohol-Povidone (TEARS PLUS OP) Place 1 drop into both eyes at bedtime.      No facility-administered medications prior to visit.      Allergies:   [name] [ethacrynic acid]; Tape; Furosemide; Sulfa antibiotics; [name]; and Torsemide   Social History   Social History  . Marital status: Married    Spouse name: N/A  . Number of children: 1  . Years of education: N/A   Occupational History  . Retired [employer]   Social History Main Topics  . Smoking status: Never Smoker  . Smokeless tobacco: Never Used  . Alcohol use No  . Drug use: No  . Sexual activity: No   Other Topics Concern  . None   Social History Narrative   Lives in [place], [place]. She got married in  25.     Family History:  The patient's family history includes Alcohol abuse in her child; Cancer in her son; Cancer (age of onset: 25) in her mother; Heart attack in her father; Heart disease in her brother, brother, and father; Heart disease (age of onset: 46) in her mother; Heart failure in her mother; Hypertension in her brother, father, mother, and sister; Hypertension (age of onset: 52) in her child; Lung disease in her brother; Stroke (age of onset: 42) in her mother.   ROS:   Please see the history of present illness.    ROS All other systems reviewed and are negative.   PHYSICAL EXAM:   VS:  BP 124/68   Pulse 64   Ht 5' 3.5" (1.613 m)   Wt 138 lb 12.8 oz (63 kg)   BMI 24.20 kg/m    GEN: Well nourished, well developed, in no acute distress  HEENT: normal  Neck: no JVD, carotid bruits, or masses Cardiac: irregular, 2/6 aortic ejection murmur,RRR; no diastolic murmurs, rubs, or gallops, mild right ankle edema  Respiratory:  clear to auscultation bilaterally, normal work of breathing GI: soft, nontender, nondistended, + BS MS: no deformity or atrophy  Skin: warm and dry, no rash Neuro:   Alert and Oriented x 3, Strength and sensation are intact Psych: euthymic mood, full affect  Wt Readings from Last 3 Encounters:  05/10/16 138 lb 12.8 oz (63 kg)  02/20/16 136 lb 6.4 oz (61.9 kg)  01/09/16 134 lb 1.6 oz (60.8 kg)      Studies/Labs Reviewed:   EKG:  EKG is ordered today.  The ekg ordered today demonstrates Atrial fibrillation, otherwise normal. QTC 422 ms  Recent Labs: 05/10/2016: ALT 13; BUN 25; Creat 1.30; Hemoglobin 12.0; Platelets 269; Potassium 4.8; Sodium 142   Lipid Panel    Component Value Date/Time   CHOL 156 05/10/2016 0958   TRIG 141 05/10/2016 0958   HDL 40 (L) 05/10/2016 0958   CHOLHDL 3.9 05/10/2016 0958   VLDL 28 05/10/2016 0958   LDLCALC 88 05/10/2016 0958    Additional studies/ records that were reviewed today include:  Notes from Dr. Redmond School and Lindi Adie, anticoagulation records  ASSESSMENT:    1. Chronic diastolic heart failure ([place])   2. PAF (paroxysmal atrial fibrillation) ([place])   3. Nonrheumatic aortic valve stenosis   4. Coronary artery disease involving native coronary artery of native heart without angina pectoris   5. Pure hypercholesterolemia   6. Hypertensive heart disease with CHF (congestive heart failure) ([place])   7. Medication management      PLAN:  In order of problems listed above:  1. CHF: Seems to be well compensated clinically and euvolemic on spironolactone as her only diuretic. 2. AFib: Rate control is excellent and she does not have any symptoms related to the arrhythmia. Maintaining prothrombin time in the adequate range has been a challenge. Discussed the potential benefits of switching to a direct oral anticoagulant. She would like to think about it. No serious bleeding. She has had at least one fall since her last appointment, none in the last few months. 3. AS: Moderate by physical exam and by her most recent echo in December 2016.  4. CAD: Asymptomatic. On statin, we stopped his Zetia at her request to  simplify her list of medications. Time to recheck lipid profile. Ideally, LDL less than 70, but will respect her wishes for avoidance of polypharmacy. 5. HLP: as above. 6. HTN: Blood pressure in optimal range. 7.  Lymphadenopathy, suspected angioimmunoblastic lymphoma    Medication Adjustments/Labs and Tests Ordered: Current medicines are reviewed at length with the patient today.  Concerns regarding medicines are outlined above.  Medication changes, Labs and Tests ordered today are listed in the Patient Instructions below. Patient Instructions  Medication Instructions: Dr Sallyanne Kuster recommends that you continue on your current medications as directed. Please refer to the Current Medication list given to you today.  Labwork: Your physician recommends that you return for lab work at your convenience - FASTING.  Testing/Procedures: NONE ORDERED  Follow-up: Dr Sallyanne Kuster recommends that you schedule a follow-up appointment in 12 months. You will receive a reminder letter in the mail two months in advance. If you don't receive a letter, please call our office to schedule the follow-up appointment.  If you need a refill on your cardiac medications before your next appointment, please call your pharmacy.    Signed, Sanda Klein, MD  05/11/2016 12:11 PM    [facility] [place], [place], [place]  [address] Phone: [phone]; Fax: [phone]

## 2016-05-10 NOTE — Patient Instructions (Signed)
Medication Instructions: Dr Sallyanne Kuster recommends that you continue on your current medications as directed. Please refer to the Current Medication list given to you today.  Labwork: Your physician recommends that you return for lab work at your convenience - FASTING.  Testing/Procedures: NONE ORDERED  Follow-up: Dr Sallyanne Kuster recommends that you schedule a follow-up appointment in 12 months. You will receive a reminder letter in the mail two months in advance. If you don't receive a letter, please call our office to schedule the follow-up appointment.  If you need a refill on your cardiac medications before your next appointment, please call your pharmacy.

## 2016-05-16 ENCOUNTER — Telehealth: Payer: Self-pay | Admitting: Cardiovascular Disease

## 2016-05-16 DIAGNOSIS — Z79899 Other long term (current) drug therapy: Secondary | ICD-10-CM

## 2016-05-16 NOTE — Telephone Encounter (Signed)
Spoke with patient and provided lab results. She will have repeat labs in a few months. CMET ordered and lab slips mailed to patient.

## 2016-05-16 NOTE — Telephone Encounter (Signed)
Pt is returning [place] call about test results

## 2016-05-22 ENCOUNTER — Other Ambulatory Visit: Payer: Self-pay | Admitting: Cardiovascular Disease

## 2016-05-22 NOTE — Telephone Encounter (Signed)
Pt stated Dr C was suppose to order her a nose spray Ipratropium  on 12.15.17 when she came in for her last visit and send it to [facility]. Pt stated she hasn't gotten it. Pt stated Dr C haven't ordered this for her before but he would order it has a courtesy because she needed it.Please advise pt [phone]

## 2016-05-22 NOTE — Telephone Encounter (Signed)
Spoke with pt she should have refills at [place], [place], as Dr Redmond School sent 5 refills in 01-2016. Gave pt phone number to call for refill.

## 2016-05-23 ENCOUNTER — Other Ambulatory Visit: Payer: Self-pay | Admitting: Cardiovascular Disease

## 2016-05-23 NOTE — Telephone Encounter (Signed)
REFILL 

## 2016-05-31 ENCOUNTER — Ambulatory Visit (INDEPENDENT_AMBULATORY_CARE_PROVIDER_SITE_OTHER): Payer: Medicare Other | Admitting: Pharmacist Clinician (PhC)/ Clinical Pharmacy Specialist

## 2016-05-31 ENCOUNTER — Other Ambulatory Visit: Payer: Self-pay | Admitting: Pharmacist Clinician (PhC)/ Clinical Pharmacy Specialist

## 2016-05-31 DIAGNOSIS — I480 Paroxysmal atrial fibrillation: Secondary | ICD-10-CM | POA: Diagnosis not present

## 2016-05-31 LAB — POCT INR: INR: 2.9

## 2016-05-31 MED ORDER — WARFARIN SODIUM 2.5 MG PO TABS: ORAL_TABLET | ORAL | 1 refills | Status: DC

## 2016-05-31 MED ORDER — METOPROLOL TARTRATE 25 MG PO TABS: 25.0000 mg | ORAL_TABLET | Freq: Two times a day (BID) | ORAL | 3 refills | Status: DC

## 2016-06-15 ENCOUNTER — Other Ambulatory Visit: Payer: Self-pay | Admitting: Cardiovascular Disease

## 2016-06-17 NOTE — Telephone Encounter (Signed)
Rx(s) sent to pharmacy electronically.  

## 2016-06-21 ENCOUNTER — Ambulatory Visit (INDEPENDENT_AMBULATORY_CARE_PROVIDER_SITE_OTHER): Payer: Medicare Other | Admitting: Pharmacist Clinician (PhC)/ Clinical Pharmacy Specialist

## 2016-06-21 DIAGNOSIS — I480 Paroxysmal atrial fibrillation: Secondary | ICD-10-CM

## 2016-06-21 LAB — POCT INR: INR: 2.7

## 2016-07-09 ENCOUNTER — Ambulatory Visit (HOSPITAL_BASED_OUTPATIENT_CLINIC_OR_DEPARTMENT_OTHER): Payer: Medicare Other | Admitting: Hematology and Oncology

## 2016-07-09 ENCOUNTER — Other Ambulatory Visit (HOSPITAL_BASED_OUTPATIENT_CLINIC_OR_DEPARTMENT_OTHER): Payer: Medicare Other

## 2016-07-09 ENCOUNTER — Encounter: Payer: Self-pay | Admitting: Hematology and Oncology

## 2016-07-09 DIAGNOSIS — C865 Angioimmunoblastic T-cell lymphoma: Secondary | ICD-10-CM

## 2016-07-09 DIAGNOSIS — C8440 Peripheral T-cell lymphoma, not classified, unspecified site: Secondary | ICD-10-CM

## 2016-07-09 LAB — CBC WITH DIFFERENTIAL/PLATELET
BASO%: 0.6 % (ref 0.0–2.0)
Basophils Absolute: 0 10*3/uL (ref 0.0–0.1)
EOS ABS: 0.1 10*3/uL (ref 0.0–0.5)
EOS%: 1 % (ref 0.0–7.0)
HEMATOCRIT: 37.2 % (ref 34.8–46.6)
HGB: 12.1 g/dL (ref 11.6–15.9)
LYMPH#: 1 10*3/uL (ref 0.9–3.3)
LYMPH%: 15 % (ref 14.0–49.7)
MCH: 28.8 pg (ref 25.1–34.0)
MCHC: 32.5 g/dL (ref 31.5–36.0)
MCV: 88.7 fL (ref 79.5–101.0)
MONO#: 0.8 10*3/uL (ref 0.1–0.9)
MONO%: 12.8 % (ref 0.0–14.0)
NEUT%: 70.6 % (ref 38.4–76.8)
NEUTROS ABS: 4.5 10*3/uL (ref 1.5–6.5)
PLATELETS: 258 10*3/uL (ref 145–400)
RBC: 4.19 10*6/uL (ref 3.70–5.45)
RDW: 15 % — ABNORMAL HIGH (ref 11.2–14.5)
WBC: 6.4 10*3/uL (ref 3.9–10.3)

## 2016-07-09 LAB — COMPREHENSIVE METABOLIC PANEL
ALK PHOS: 89 U/L (ref 40–150)
ALT: 16 U/L (ref 0–55)
ANION GAP: 9 meq/L (ref 3–11)
AST: 20 U/L (ref 5–34)
Albumin: 4 g/dL (ref 3.5–5.0)
BUN: 18.6 mg/dL (ref 7.0–26.0)
CALCIUM: 10 mg/dL (ref 8.4–10.4)
CO2: 25 meq/L (ref 22–29)
CREATININE: 1.1 mg/dL (ref 0.6–1.1)
Chloride: 107 mEq/L (ref 98–109)
EGFR: 46 mL/min/{1.73_m2} — AB (ref >90)
Glucose: 90 mg/dl (ref 70–140)
Potassium: 4.3 mEq/L (ref 3.5–5.1)
Sodium: 141 mEq/L (ref 136–145)
TOTAL PROTEIN: 7.7 g/dL (ref 6.4–8.3)
Total Bilirubin: 1.11 mg/dL (ref 0.20–1.20)

## 2016-07-09 LAB — LACTATE DEHYDROGENASE: LDH: 213 U/L (ref 125–245)

## 2016-07-09 NOTE — Progress Notes (Signed)
Patient Care Team: [name], MD as PCP - General (Family Medicine)  DIAGNOSIS:  Encounter Diagnosis  Name Primary?  . Angioimmunoblastic lymphoma ([place])     SUMMARY OF ONCOLOGIC HISTORY:   Angioimmunoblastic lymphoma ([place])   03/16/2015 Initial Diagnosis    Left x-ray lymph node biopsy: Atypical lymphoid proliferation suspicious for lymphoma, flow cytometry revealed T cells CD3, CD43 and CD5 associated with BCL-2 expression, suspicious for angioimmunoblastic T-cell lymphoma      03/29/2015 Imaging     Borderline bil axillary LN and inguinal LN      04/15/2015 - 04/19/2015 Hospital Admission    Closed Rt Hip Fracture      07/06/2015 Imaging    Progressive bil Axillary and inguinal lymphadenopathy        CHIEF COMPLIANT: Follow-up of angioimmunoblastic lymphoma  INTERVAL HISTORY: [name] is a 83 year old with above-mentioned history of lymphadenopathy biopsy-proven to be angioimmunoblastic lymphoma. She is completely asymptomatic and is currently on surveillance and observation. She has had enlarged axillary and inguinal lymph nodes which have remained fairly stable. She denies any fevers chills night sweats or hot flashes.  REVIEW OF SYSTEMS:   Constitutional: Denies fevers, chills or abnormal weight loss Eyes: Denies blurriness of vision Ears, nose, mouth, throat, and face: Denies mucositis or sore throat Respiratory: Denies cough, dyspnea or wheezes Cardiovascular: Denies palpitation, chest discomfort Gastrointestinal:  Denies nausea, heartburn or change in bowel habits Skin: Denies abnormal skin rashes Lymphatics: Axillary and inguinal lymph nodes enlarged Neurological:Denies numbness, tingling or new weaknesses Behavioral/Psych: Mood is stable, no new changes  Extremities: No lower extremity edema Breast:  denies any pain or lumps or nodules in either breasts All other systems were reviewed with the patient and are negative.  I have reviewed the past  medical history, past surgical history, social history and family history with the patient and they are unchanged from previous note.  ALLERGIES:  is allergic to edecrin [ethacrynic acid]; tape; furosemide; sulfa antibiotics; thiazide-type diuretics; and torsemide.  MEDICATIONS:  Current Outpatient Prescriptions  Medication Sig Dispense Refill  . acetaminophen (TYLENOL) 650 MG CR tablet Take 650 mg by mouth 3 (three) times daily. Morning, supper and bedtime    . albuterol (PROVENTIL HFA;VENTOLIN HFA) 108 (90 BASE) MCG/ACT inhaler Inhale 2 puffs into the lungs every 6 (six) hours as needed for wheezing or shortness of breath.    Marland Kitchen atorvastatin (LIPITOR) 40 MG tablet TAKE 1 TABLET DAILY 90 tablet 2  . ezetimibe (ZETIA) 10 MG tablet TAKE 1 TABLET DAILY (MAKE APPOINTMENT FOR FUTURE REFILLS) 90 tablet 3  . folic acid (FOLVITE) 1 MG tablet TAKE 2 TABLETS DAILY 180 tablet 3  . ipratropium (ATROVENT) 0.03 % nasal spray Place 2 sprays into both nostrils 2 (two) times daily. Use as directed 30 mL 5  . levothyroxine (SYNTHROID, LEVOTHROID) 75 MCG tablet Take 1 tablet (75 mcg total) by mouth daily before breakfast. 30 tablet 2  . loratadine (CLARITIN) 10 MG tablet Take 10 mg by mouth daily with breakfast.     . metoprolol tartrate (LOPRESSOR) 25 MG tablet Take 1 tablet (25 mg total) by mouth 2 (two) times daily. 180 tablet 3  . Multiple Vitamin (MULTIVITAMIN WITH MINERALS) TABS tablet Take 1 tablet by mouth daily. Centrum Silver    . spironolactone (ALDACTONE) 25 MG tablet TAKE 1 TABLET DAILY 90 tablet 3  . triamcinolone cream (KENALOG) 0.1 % Apply 1 application topically daily as needed (rash).     . warfarin (COUMADIN) 2.5  MG tablet Take 1 tablet by mouth daily or as directed by coumadin clinic 90 tablet 1   No current facility-administered medications for this visit.     PHYSICAL EXAMINATION: ECOG PERFORMANCE STATUS: 1 - Symptomatic but completely ambulatory  Vitals:   07/09/16 1109  BP: 138/66    Pulse: 70  Resp: 16  Temp: 97.1 F (36.2 C)   Filed Weights   07/09/16 1109  Weight: 144 lb 9.6 oz (65.6 kg)    GENERAL:alert, no distress and comfortable SKIN: skin color, texture, turgor are normal, no rashes or significant lesions EYES: normal, Conjunctiva are pink and non-injected, sclera clear OROPHARYNX:no exudate, no erythema and lips, buccal mucosa, and tongue normal  NECK: supple, thyroid normal size, non-tender, without nodularity LYMPH:  palpable axillary and inguinal lymphadenopathy LUNGS: clear to auscultation and percussion with normal breathing effort HEART: regular rate & rhythm and no murmurs and no lower extremity edema ABDOMEN:abdomen soft, non-tender and normal bowel sounds MUSCULOSKELETAL:no cyanosis of digits and no clubbing  NEURO: alert & oriented x 3 with fluent speech, no focal motor/sensory deficits EXTREMITIES: No lower extremity edema   LABORATORY DATA:  I have reviewed the data as listed   Chemistry      Component Value Date/Time   NA 141 07/09/2016 1047   K 4.3 07/09/2016 1047   CL 106 05/10/2016 0958   CO2 25 07/09/2016 1047   BUN 18.6 07/09/2016 1047   CREATININE 1.1 07/09/2016 1047      Component Value Date/Time   CALCIUM 10.0 07/09/2016 1047   ALKPHOS 89 07/09/2016 1047   AST 20 07/09/2016 1047   ALT 16 07/09/2016 1047   BILITOT 1.11 07/09/2016 1047       Lab Results  Component Value Date   WBC 6.4 07/09/2016   HGB 12.1 07/09/2016   HCT 37.2 07/09/2016   MCV 88.7 07/09/2016   PLT 258 07/09/2016   NEUTROABS 4.5 07/09/2016    ASSESSMENT & PLAN:  Angioimmunoblastic lymphoma ([place]) Right axillary lymphadenopathy: Detected through with routine screening mammogram status post ultrasound-guided biopsy, 8 mm lymph nodes, atypical lymphoproliferative process suspicious for lymphoma, flow cytometry revealed excess CD4 positive cells suspicious for angioimmunoblastic T-cell lymphoma CT scans 03/29/15: Borderline bil axillary LN and  inguinal LN Ct scans 07/06/15: Progressive bil Axillary and inguinal lymphadenopathy  Hospitalization: 11/19 to 04/19/15: Closed Rt Hip Fracture  Plan:  1. I discussed with her that given her advanced age, I do not recommend aggressive chemotherapy. 2. Options were surveillance versus prednisone therapy. We will continue to do surveillance  Patient wanted to get mammograms for surveillance. I suggested that mammograms are optional. If she wishes to undergo the mammograms she could call the breast center and schedule it.  Return to clinic in 1 year with CBC, CMP and LDH   I spent 25 minutes talking to the patient of which more than half was spent in counseling and coordination of care.  Orders Placed This Encounter  Procedures  . CBC with Differential    Standing Status:   Future    Standing Expiration Date:   07/09/2017  . Comprehensive metabolic panel    Standing Status:   Future    Standing Expiration Date:   07/09/2017  . Lactate dehydrogenase (LDH)    Standing Status:   Future    Standing Expiration Date:   07/09/2017   The patient has a good understanding of the overall plan. she agrees with it. she will call with any problems that may develop  before the next visit here.   Rulon Eisenmenger, MD 07/09/16

## 2016-07-09 NOTE — Assessment & Plan Note (Signed)
Right axillary lymphadenopathy: Detected through with routine screening mammogram status post ultrasound-guided biopsy, 8 mm lymph nodes, atypical lymphoproliferative process suspicious for lymphoma, flow cytometry revealed excess CD4 positive cells suspicious for angioimmunoblastic T-cell lymphoma CT scans 03/29/15: Borderline bil axillary LN and inguinal LN Ct scans 07/06/15: Progressive bil Axillary and inguinal lymphadenopathy  Hospitalization: 11/19 to 04/19/15: Closed Rt Hip Fracture  Plan:  1. I discussed with her that given her advanced age, I do not recommend aggressive chemotherapy. 2. Options were surveillance versus prednisone therapy. We will continue to do surveillance  Return to clinic in 6 months with CBC, CMP and LDH

## 2016-07-13 ENCOUNTER — Telehealth: Payer: Self-pay | Admitting: Hematology and Oncology

## 2016-07-13 NOTE — Telephone Encounter (Signed)
Lvm advising appt 07/08/17 @ 2.15pm. Also, mailed calendar.

## 2016-08-02 ENCOUNTER — Ambulatory Visit (INDEPENDENT_AMBULATORY_CARE_PROVIDER_SITE_OTHER): Payer: Medicare Other | Admitting: Pharmacist Clinician (PhC)/ Clinical Pharmacy Specialist

## 2016-08-02 DIAGNOSIS — I480 Paroxysmal atrial fibrillation: Secondary | ICD-10-CM | POA: Diagnosis not present

## 2016-08-02 LAB — POCT INR: INR: 3.4

## 2016-08-20 ENCOUNTER — Ambulatory Visit (INDEPENDENT_AMBULATORY_CARE_PROVIDER_SITE_OTHER): Payer: Medicare Other | Admitting: Pharmacist

## 2016-08-20 DIAGNOSIS — I480 Paroxysmal atrial fibrillation: Secondary | ICD-10-CM | POA: Diagnosis not present

## 2016-08-20 LAB — POCT INR: INR: 3.3

## 2016-08-26 ENCOUNTER — Other Ambulatory Visit: Payer: Self-pay | Admitting: Cardiovascular Disease

## 2016-08-26 NOTE — Telephone Encounter (Signed)
Rx request sent to pharmacy.  

## 2016-08-29 ENCOUNTER — Encounter: Payer: Self-pay | Admitting: Family Medicine

## 2016-08-29 ENCOUNTER — Ambulatory Visit (INDEPENDENT_AMBULATORY_CARE_PROVIDER_SITE_OTHER): Payer: Medicare Other | Admitting: Family Medicine

## 2016-08-29 VITALS — BP 140/88 | HR 84 | Ht 62.5 in | Wt 144.0 lb

## 2016-08-29 DIAGNOSIS — Z23 Encounter for immunization: Secondary | ICD-10-CM | POA: Diagnosis not present

## 2016-08-29 DIAGNOSIS — I350 Nonrheumatic aortic (valve) stenosis: Secondary | ICD-10-CM | POA: Diagnosis not present

## 2016-08-29 DIAGNOSIS — D692 Other nonthrombocytopenic purpura: Secondary | ICD-10-CM | POA: Diagnosis not present

## 2016-08-29 DIAGNOSIS — E038 Other specified hypothyroidism: Secondary | ICD-10-CM

## 2016-08-29 DIAGNOSIS — Z7901 Long term (current) use of anticoagulants: Secondary | ICD-10-CM

## 2016-08-29 DIAGNOSIS — I5032 Chronic diastolic (congestive) heart failure: Secondary | ICD-10-CM | POA: Diagnosis not present

## 2016-08-29 DIAGNOSIS — I110 Hypertensive heart disease with heart failure: Secondary | ICD-10-CM | POA: Diagnosis not present

## 2016-08-29 DIAGNOSIS — I480 Paroxysmal atrial fibrillation: Secondary | ICD-10-CM

## 2016-08-29 DIAGNOSIS — E071 Dyshormogenetic goiter: Secondary | ICD-10-CM | POA: Diagnosis not present

## 2016-08-29 DIAGNOSIS — Z8781 Personal history of (healed) traumatic fracture: Secondary | ICD-10-CM

## 2016-08-29 DIAGNOSIS — C8440 Peripheral T-cell lymphoma, not classified, unspecified site: Secondary | ICD-10-CM

## 2016-08-29 DIAGNOSIS — E7800 Pure hypercholesterolemia, unspecified: Secondary | ICD-10-CM | POA: Diagnosis not present

## 2016-08-29 DIAGNOSIS — M1990 Unspecified osteoarthritis, unspecified site: Secondary | ICD-10-CM

## 2016-08-29 DIAGNOSIS — E039 Hypothyroidism, unspecified: Secondary | ICD-10-CM

## 2016-08-29 NOTE — Progress Notes (Signed)
[name] is a 83 y.o. female who presents for annual wellness visit and follow-up on chronic medical conditions.  She has the following concerns: She does have underlying heart disease with CHF as well as PAF and she is being followed by cardiology for this. She's had no chest pain, shortness of breath. She also continues on Coumadin for treatment of her PAF. She also taking her thyroid medication and having no difficulty with that. She continues on atorvastatin and is having no trouble with that. She does use Atrovent for her allergy symptoms. She rarely uses albuterol. She also is taking Claritin fairly regularly. Her arthritis gives her very little difficulty. She has been seen by oncology for her lymphoma and apparently this has been stable. No intervention is planned there. She has a previous history of hip fracture.  Immunizations and Health Maintenance Immunization History  Administered Date(s) Administered  . Influenza Split 03/03/2012, 03/09/2014  . Influenza Whole 03/05/2011  . Influenza, High Dose Seasonal PF 02/20/2015, 02/08/2016  . Influenza-Unspecified 02/24/2013  . Pneumococcal Polysaccharide-23 12/15/2006, 11/13/2012  . Td 05/27/2004  . Zoster 12/22/2006   Health Maintenance Due  Topic Date Due  . DEXA SCAN  10/06/1990  . PNA vac Low Risk Adult (2 of 2 - PCV13) 11/13/2013  . TETANUS/TDAP  05/27/2014   Sees Dr. Loletha Grayer Last Pap smear:n/a Last mammogram:n/a Last colonoscopy:n/a Last DEXA: Dentist:--- Ophtho:--- Exercise: Routine ADLs  Other doctors caring for patient include: Dr. Loletha Grayer Dr Lindi Adie  Advanced directives: yes    Depression screen:  See questionnaire below.  Depression screen [facility] 2/9 08/29/2016 04/13/2015  Decreased Interest 0 0  Down, Depressed, Hopeless 0 0  PHQ - 2 Score 0 0    Fall Risk Screen: see questionnaire below. Fall Risk  08/29/2016 04/13/2015  Falls in the past year? No No    ADL screen:  See questionnaire below Functional Status Survey:      Review of Systems Constitutional: -, -unexpected weight change, -anorexia, -fatigue Allergy: -sneezing, -itching, -congestion Dermatology: denies changing moles, rash, lumps ENT: -runny nose, -ear pain, -sore throat,  Cardiology:  -chest pain, -palpitations, -orthopnea, Respiratory: -cough, -shortness of breath, -dyspnea on exertion, -wheezing,  Gastroenterology: -abdominal pain, -nausea, -vomiting, -diarrhea, -constipation, -dysphagia Hematology: -bleeding or bruising problems Musculoskeletal: -arthralgias, -myalgias, -joint swelling, -back pain, - Ophthalmology: -vision changes,  Urology: -dysuria, -difficulty urinating,  -urinary frequency, -urgency, incontinence Neurology: -, -numbness, , -memory loss, -falls, -dizziness    PHYSICAL EXAM:  BP 140/88 Comment: pt has not taken med yet toda  Pulse 84   Ht 5' 2.5" (1.588 m)   Wt 144 lb (65.3 kg)   SpO2 98%   BMI 25.92 kg/m   General Appearance: Alert, cooperative, no distress, appears stated age Head: Normocephalic, without obvious abnormality, atraumatic Eyes: PERRL, conjunctiva/corneas clear, EOM's intact, fundi benign Ears: Normal TM's and external ear canals Nose: Nares normal, mucosa normal, no drainage or sinus tenderness Throat: Lips, mucosa, and tongue normal; teeth and gums normal Neck: Supple, no lymphadenopathy;  thyroid:  no enlargement/tenderness/nodules; no carotid bruit or JVD Lungs: Clear to auscultation bilaterally without wheezes, rales or ronchi; respirations unlabored Heart: Regular rate and rhythm, S1 and S2 normal, no murmur, rubor gallop Abdomen: Soft, non-tender, nondistended, normoactive bowel sounds,  no masses, no hepatosplenomegaly Extremities: No clubbing, cyanosis or edema Pulses: 2+ and symmetric all extremities Skin:  Skin color, texture, turgor normal, no rashes or lesions Lymph nodes: Cervical, supraclavicular, and axillary nodes normal Neurologic:  CNII-XII intact, normal strength,  sensation and gait; reflexes 2+ and symmetric throughout Psych: Normal mood, affect, hygiene and grooming.  ASSESSMENT/PLAN: Hypertensive heart disease with CHF (congestive heart failure) ([place])  Need for vaccination with 13-polyvalent pneumococcal conjugate vaccine - Plan: Pneumococcal conjugate vaccine [id number]  Chronic diastolic heart failure (HCC)  Arthritis  PAF (paroxysmal atrial fibrillation) (HCC)  Senile purpura (HCC)  Nonrheumatic aortic valve stenosis  Other specified hypothyroidism  Long term current use of anticoagulant therapy  Pure hypercholesterolemia  Hypothyroidism, goitrous  Angioimmunoblastic lymphoma ([place])  History of fracture of right hip     Discussed monthly self breast exams and yearly mammograms; at least 30 minutes of aerobic activity at least 5 days/week and weight-bearing exercise 2x/week; proper sunscreen use reviewed; healthy diet, including goals of calcium and vitamin D intake and alcohol recommendations (less than or equal to 1 drink/day) reviewed; regular seatbelt use; changing batteries in smoke detectors.  Immunization recommendations discussed.  Colonoscopy recommendations reviewed   Medicare Attestation I have personally reviewed: The patient's medical and social history Their use of alcohol, tobacco or illicit drugs Their current medications and supplements The patient's functional ability including ADLs,fall risks, home safety risks, cognitive, and hearing and visual impairment Diet and physical activities Evidence for depression or mood disorders  The patient's weight, height, and BMI have been recorded in the chart.  I have made referrals, counseling, and provided education to the patient based on review of the above and I have provided the patient with a written personalized care plan for preventive services.   Overall she is doing quite well for her age. Recommend she continue to take good care of herself.  Wyatt Haste, MD   08/29/2016

## 2016-08-29 NOTE — Patient Instructions (Signed)
  [name] , Thank you for taking time to come for your Medicare Wellness Visit. I appreciate your ongoing commitment to your health goals. Please review the following plan we discussed and let me know if I can assist you in the future.   These are the goals we discussed: Goals    None      This is a list of the screening recommended for you and due dates:  Health Maintenance  Topic Date Due  . DEXA scan (bone density measurement)  10/06/1990  . Pneumonia vaccines (2 of 2 - PCV13) 11/13/2013  . Tetanus Vaccine  05/27/2014  . Flu Shot  12/25/2016

## 2016-09-11 ENCOUNTER — Ambulatory Visit (INDEPENDENT_AMBULATORY_CARE_PROVIDER_SITE_OTHER): Payer: Medicare Other | Admitting: Pharmacist

## 2016-09-11 DIAGNOSIS — I480 Paroxysmal atrial fibrillation: Secondary | ICD-10-CM | POA: Diagnosis not present

## 2016-09-11 DIAGNOSIS — Z7901 Long term (current) use of anticoagulants: Secondary | ICD-10-CM

## 2016-09-11 LAB — POCT INR: INR: 2.3

## 2016-09-12 ENCOUNTER — Telehealth: Payer: Self-pay | Admitting: Family Medicine

## 2016-09-12 NOTE — Telephone Encounter (Signed)
Pt requesting to speak to Cheri about the shots that she was told to go the pharmacy to get. She did not want to give details

## 2016-09-13 NOTE — Telephone Encounter (Signed)
Pt just wanted to let you know she has to wait a couple months to see if in. Will cover shingrix

## 2016-09-13 NOTE — Telephone Encounter (Signed)
Left message for pt to call me back 

## 2016-10-02 ENCOUNTER — Emergency Department (HOSPITAL_COMMUNITY): Payer: Medicare Other

## 2016-10-02 ENCOUNTER — Inpatient Hospital Stay (HOSPITAL_COMMUNITY)
Admission: EM | Admit: 2016-10-02 | Discharge: 2016-10-08 | DRG: 308 | Disposition: A | Discharge status: Skilled Nursing Facility | Payer: Medicare Other | Attending: Internal Medicine | Admitting: Internal Medicine

## 2016-10-02 ENCOUNTER — Encounter (HOSPITAL_COMMUNITY): Payer: Self-pay | Admitting: Pharmacy Technician

## 2016-10-02 DIAGNOSIS — Z7901 Long term (current) use of anticoagulants: Secondary | ICD-10-CM | POA: Diagnosis not present

## 2016-10-02 DIAGNOSIS — I248 Other forms of acute ischemic heart disease: Secondary | ICD-10-CM | POA: Diagnosis present

## 2016-10-02 DIAGNOSIS — J9601 Acute respiratory failure with hypoxia: Secondary | ICD-10-CM

## 2016-10-02 DIAGNOSIS — I360 Nonrheumatic tricuspid (valve) stenosis: Secondary | ICD-10-CM | POA: Diagnosis not present

## 2016-10-02 DIAGNOSIS — E039 Hypothyroidism, unspecified: Secondary | ICD-10-CM | POA: Diagnosis present

## 2016-10-02 DIAGNOSIS — E876 Hypokalemia: Secondary | ICD-10-CM | POA: Diagnosis not present

## 2016-10-02 DIAGNOSIS — E049 Nontoxic goiter, unspecified: Secondary | ICD-10-CM | POA: Diagnosis present

## 2016-10-02 DIAGNOSIS — E7800 Pure hypercholesterolemia, unspecified: Secondary | ICD-10-CM | POA: Diagnosis not present

## 2016-10-02 DIAGNOSIS — I4891 Unspecified atrial fibrillation: Secondary | ICD-10-CM | POA: Diagnosis not present

## 2016-10-02 DIAGNOSIS — Z882 Allergy status to sulfonamides status: Secondary | ICD-10-CM | POA: Diagnosis not present

## 2016-10-02 DIAGNOSIS — R0602 Shortness of breath: Secondary | ICD-10-CM | POA: Diagnosis present

## 2016-10-02 DIAGNOSIS — I110 Hypertensive heart disease with heart failure: Secondary | ICD-10-CM | POA: Diagnosis present

## 2016-10-02 DIAGNOSIS — I959 Hypotension, unspecified: Secondary | ICD-10-CM | POA: Diagnosis present

## 2016-10-02 DIAGNOSIS — E785 Hyperlipidemia, unspecified: Secondary | ICD-10-CM | POA: Diagnosis present

## 2016-10-02 DIAGNOSIS — R109 Unspecified abdominal pain: Secondary | ICD-10-CM

## 2016-10-02 DIAGNOSIS — J810 Acute pulmonary edema: Secondary | ICD-10-CM

## 2016-10-02 DIAGNOSIS — Z8249 Family history of ischemic heart disease and other diseases of the circulatory system: Secondary | ICD-10-CM

## 2016-10-02 DIAGNOSIS — L8990 Pressure ulcer of unspecified site, unspecified stage: Secondary | ICD-10-CM | POA: Insufficient documentation

## 2016-10-02 DIAGNOSIS — I480 Paroxysmal atrial fibrillation: Principal | ICD-10-CM | POA: Diagnosis present

## 2016-10-02 DIAGNOSIS — N390 Urinary tract infection, site not specified: Secondary | ICD-10-CM | POA: Diagnosis not present

## 2016-10-02 DIAGNOSIS — N179 Acute kidney failure, unspecified: Secondary | ICD-10-CM | POA: Diagnosis not present

## 2016-10-02 DIAGNOSIS — D72829 Elevated white blood cell count, unspecified: Secondary | ICD-10-CM | POA: Diagnosis present

## 2016-10-02 DIAGNOSIS — K567 Ileus, unspecified: Secondary | ICD-10-CM | POA: Diagnosis not present

## 2016-10-02 DIAGNOSIS — I481 Persistent atrial fibrillation: Secondary | ICD-10-CM | POA: Diagnosis present

## 2016-10-02 DIAGNOSIS — I9589 Other hypotension: Secondary | ICD-10-CM

## 2016-10-02 DIAGNOSIS — I509 Heart failure, unspecified: Secondary | ICD-10-CM

## 2016-10-02 DIAGNOSIS — D649 Anemia, unspecified: Secondary | ICD-10-CM | POA: Diagnosis present

## 2016-10-02 DIAGNOSIS — I060 Rheumatic aortic stenosis: Secondary | ICD-10-CM | POA: Diagnosis not present

## 2016-10-02 DIAGNOSIS — I350 Nonrheumatic aortic (valve) stenosis: Secondary | ICD-10-CM | POA: Diagnosis not present

## 2016-10-02 DIAGNOSIS — I5043 Acute on chronic combined systolic (congestive) and diastolic (congestive) heart failure: Secondary | ICD-10-CM

## 2016-10-02 DIAGNOSIS — I2510 Atherosclerotic heart disease of native coronary artery without angina pectoris: Secondary | ICD-10-CM | POA: Diagnosis present

## 2016-10-02 DIAGNOSIS — Z823 Family history of stroke: Secondary | ICD-10-CM | POA: Diagnosis not present

## 2016-10-02 DIAGNOSIS — L89311 Pressure ulcer of right buttock, stage 1: Secondary | ICD-10-CM | POA: Diagnosis present

## 2016-10-02 DIAGNOSIS — R140 Abdominal distension (gaseous): Secondary | ICD-10-CM | POA: Diagnosis not present

## 2016-10-02 DIAGNOSIS — Z955 Presence of coronary angioplasty implant and graft: Secondary | ICD-10-CM | POA: Diagnosis not present

## 2016-10-02 DIAGNOSIS — E071 Dyshormogenetic goiter: Secondary | ICD-10-CM | POA: Diagnosis not present

## 2016-10-02 DIAGNOSIS — K529 Noninfective gastroenteritis and colitis, unspecified: Secondary | ICD-10-CM | POA: Diagnosis not present

## 2016-10-02 DIAGNOSIS — I5033 Acute on chronic diastolic (congestive) heart failure: Secondary | ICD-10-CM | POA: Diagnosis present

## 2016-10-02 DIAGNOSIS — B9620 Unspecified Escherichia coli [E. coli] as the cause of diseases classified elsewhere: Secondary | ICD-10-CM | POA: Diagnosis not present

## 2016-10-02 DIAGNOSIS — I482 Chronic atrial fibrillation: Secondary | ICD-10-CM | POA: Diagnosis not present

## 2016-10-02 DIAGNOSIS — Z96641 Presence of right artificial hip joint: Secondary | ICD-10-CM | POA: Diagnosis present

## 2016-10-02 LAB — BRAIN NATRIURETIC PEPTIDE: B Natriuretic Peptide: 317.8 pg/mL — ABNORMAL HIGH (ref 0.0–100.0)

## 2016-10-02 LAB — COMPREHENSIVE METABOLIC PANEL
ALBUMIN: 3.9 g/dL (ref 3.5–5.0)
ALT: 15 U/L (ref 14–54)
ANION GAP: 8 (ref 5–15)
AST: 25 U/L (ref 15–41)
Alkaline Phosphatase: 85 U/L (ref 38–126)
BILIRUBIN TOTAL: 1.2 mg/dL (ref 0.3–1.2)
BUN: 17 mg/dL (ref 6–20)
CHLORIDE: 106 mmol/L (ref 101–111)
CO2: 24 mmol/L (ref 22–32)
Calcium: 9 mg/dL (ref 8.9–10.3)
Creatinine, Ser: 1.01 mg/dL — ABNORMAL HIGH (ref 0.44–1.00)
GFR calc Af Amer: 55 mL/min — ABNORMAL LOW (ref >60)
GFR calc non Af Amer: 48 mL/min — ABNORMAL LOW (ref >60)
GLUCOSE: 135 mg/dL — AB (ref 65–99)
POTASSIUM: 4.1 mmol/L (ref 3.5–5.1)
SODIUM: 138 mmol/L (ref 135–145)
TOTAL PROTEIN: 7.4 g/dL (ref 6.5–8.1)

## 2016-10-02 LAB — CBC WITH DIFFERENTIAL/PLATELET
BASOS ABS: 0 10*3/uL (ref 0.0–0.1)
Basophils Relative: 0 %
EOS PCT: 0 %
Eosinophils Absolute: 0 10*3/uL (ref 0.0–0.7)
HCT: 38.1 % (ref 36.0–46.0)
Hemoglobin: 11.8 g/dL — ABNORMAL LOW (ref 12.0–15.0)
LYMPHS ABS: 1.2 10*3/uL (ref 0.7–4.0)
Lymphocytes Relative: 7 %
MCH: 28.6 pg (ref 26.0–34.0)
MCHC: 31 g/dL (ref 30.0–36.0)
MCV: 92.5 fL (ref 78.0–100.0)
MONOS PCT: 7 %
Monocytes Absolute: 1.2 10*3/uL — ABNORMAL HIGH (ref 0.1–1.0)
NEUTROS PCT: 86 %
Neutro Abs: 14.6 10*3/uL — ABNORMAL HIGH (ref 1.7–7.7)
PLATELETS: 240 10*3/uL (ref 150–400)
RBC: 4.12 MIL/uL (ref 3.87–5.11)
RDW: 14.3 % (ref 11.5–15.5)
WBC: 17 10*3/uL — AB (ref 4.0–10.5)

## 2016-10-02 LAB — TROPONIN I
Troponin I: 0.04 ng/mL (ref <0.03)
Troponin I: 0.1 ng/mL (ref <0.03)

## 2016-10-02 LAB — PROTIME-INR
INR: 2.51
Prothrombin Time: 27.5 seconds — ABNORMAL HIGH (ref 11.4–15.2)

## 2016-10-02 LAB — TSH: TSH: 4.76 u[IU]/mL — ABNORMAL HIGH (ref 0.350–4.500)

## 2016-10-02 MED ORDER — AMIODARONE HCL IN DEXTROSE 360-4.14 MG/200ML-% IV SOLN
60.0000 mg/h | INTRAVENOUS | Status: DC
  Filled 2016-10-02: qty 200

## 2016-10-02 MED ORDER — AMIODARONE HCL IN DEXTROSE 360-4.14 MG/200ML-% IV SOLN
60.0000 mg/h | INTRAVENOUS | Status: DC
  Administered 2016-10-02: 60 mg/h via INTRAVENOUS

## 2016-10-02 MED ORDER — ATORVASTATIN CALCIUM 40 MG PO TABS
40.0000 mg | ORAL_TABLET | Freq: Every day | ORAL | Status: DC
  Administered 2016-10-02 – 2016-10-07 (×6): 40 mg via ORAL
  Filled 2016-10-02 (×6): qty 1

## 2016-10-02 MED ORDER — ETHACRYNIC ACID 25 MG PO TABS
75.0000 mg | ORAL_TABLET | Freq: Two times a day (BID) | ORAL | Status: DC
  Administered 2016-10-02 – 2016-10-03 (×2): 75 mg via ORAL
  Filled 2016-10-02 (×3): qty 3

## 2016-10-02 MED ORDER — ONDANSETRON HCL 4 MG/2ML IJ SOLN
INTRAMUSCULAR | Status: AC
  Administered 2016-10-02: 4 mg
  Filled 2016-10-02: qty 2

## 2016-10-02 MED ORDER — FOLIC ACID 1 MG PO TABS
2.0000 mg | ORAL_TABLET | Freq: Every day | ORAL | Status: DC
  Administered 2016-10-03 – 2016-10-08 (×6): 2 mg via ORAL
  Filled 2016-10-02 (×6): qty 2

## 2016-10-02 MED ORDER — ACETAMINOPHEN 325 MG PO TABS: 650.0000 mg | ORAL_TABLET | ORAL | Status: DC | PRN

## 2016-10-02 MED ORDER — AMIODARONE HCL IN DEXTROSE 360-4.14 MG/200ML-% IV SOLN
30.0000 mg/h | INTRAVENOUS | Status: DC
  Administered 2016-10-03 – 2016-10-04 (×3): 30 mg/h via INTRAVENOUS
  Filled 2016-10-02: qty 200

## 2016-10-02 MED ORDER — AMIODARONE LOAD VIA INFUSION
150.0000 mg | Freq: Once | INTRAVENOUS | Status: DC
  Filled 2016-10-02: qty 83.34

## 2016-10-02 MED ORDER — ETOMIDATE 2 MG/ML IV SOLN
6.0000 mg | Freq: Once | INTRAVENOUS | Status: AC
  Administered 2016-10-02: 6 mg via INTRAVENOUS

## 2016-10-02 MED ORDER — ONDANSETRON HCL 4 MG/2ML IJ SOLN: 4.0000 mg | Freq: Four times a day (QID) | INTRAMUSCULAR | Status: DC | PRN

## 2016-10-02 MED ORDER — ETHACRYNIC ACID 25 MG PO TABS: 75.0000 mg | ORAL_TABLET | Freq: Once | ORAL | Status: DC

## 2016-10-02 MED ORDER — IPRATROPIUM BROMIDE 0.02 % IN SOLN: 0.5000 mg | RESPIRATORY_TRACT | Status: DC | PRN

## 2016-10-02 MED ORDER — FENTANYL CITRATE (PF) 100 MCG/2ML IJ SOLN
50.0000 ug | Freq: Once | INTRAMUSCULAR | Status: AC
  Administered 2016-10-02: 50 ug via INTRAVENOUS
  Filled 2016-10-02: qty 2

## 2016-10-02 MED ORDER — ONDANSETRON HCL 4 MG/2ML IJ SOLN
4.0000 mg | Freq: Two times a day (BID) | INTRAMUSCULAR | Status: DC | PRN
  Administered 2016-10-02 – 2016-10-07 (×6): 4 mg via INTRAVENOUS
  Filled 2016-10-02 (×6): qty 2

## 2016-10-02 MED ORDER — IPRATROPIUM BROMIDE 0.02 % IN SOLN
0.5000 mg | RESPIRATORY_TRACT | Status: DC
  Administered 2016-10-02: 0.5 mg via RESPIRATORY_TRACT
  Filled 2016-10-02 (×2): qty 2.5

## 2016-10-02 MED ORDER — ETHACRYNATE SODIUM 50 MG IV SOLR: 75.0000 mg | Freq: Two times a day (BID) | INTRAVENOUS | Status: DC

## 2016-10-02 MED ORDER — METOPROLOL TARTRATE 5 MG/5ML IV SOLN
INTRAVENOUS | Status: AC
  Administered 2016-10-02: 5 mg
  Filled 2016-10-02: qty 5

## 2016-10-02 MED ORDER — ONDANSETRON HCL 4 MG/2ML IJ SOLN
4.0000 mg | Freq: Once | INTRAMUSCULAR | Status: DC | PRN
Start: 2016-10-02 — End: 2016-10-03

## 2016-10-02 MED ORDER — AMIODARONE HCL IN DEXTROSE 360-4.14 MG/200ML-% IV SOLN
30.0000 mg/h | INTRAVENOUS | Status: DC
  Filled 2016-10-02 (×3): qty 200

## 2016-10-02 MED ORDER — LEVOTHYROXINE SODIUM 88 MCG PO TABS
88.0000 ug | ORAL_TABLET | Freq: Every day | ORAL | Status: DC
  Administered 2016-10-03 – 2016-10-08 (×6): 88 ug via ORAL
  Filled 2016-10-02 (×6): qty 1

## 2016-10-02 MED ORDER — ACETAMINOPHEN 325 MG PO TABS
650.0000 mg | ORAL_TABLET | Freq: Three times a day (TID) | ORAL | Status: DC
  Administered 2016-10-02 – 2016-10-08 (×17): 650 mg via ORAL
  Filled 2016-10-02 (×17): qty 2

## 2016-10-02 MED ORDER — METHYLPREDNISOLONE SODIUM SUCC 125 MG IJ SOLR
60.0000 mg | Freq: Two times a day (BID) | INTRAMUSCULAR | Status: AC
  Administered 2016-10-02 – 2016-10-04 (×4): 60 mg via INTRAVENOUS
  Filled 2016-10-02 (×4): qty 2

## 2016-10-02 MED ORDER — LORATADINE 10 MG PO TABS
10.0000 mg | ORAL_TABLET | Freq: Every day | ORAL | Status: DC
  Administered 2016-10-03 – 2016-10-05 (×3): 10 mg via ORAL
  Filled 2016-10-02 (×3): qty 1

## 2016-10-02 NOTE — ED Triage Notes (Signed)
Pt arrives from home via ems with reports of sudden onset SOB and felt like her heart was racing. EMS reports pt in Afib RVR. Pt pale and diaphoretic upon arrival. Pt hypertensive with EMS. Pt arrived on a NRB mask. Pt had 1 episode of Emesis upon arrival.

## 2016-10-02 NOTE — ED Provider Notes (Signed)
[facility] DEPT Provider Note   CSN: [id number] Arrival date & time: 10/02/16  1640     History   Chief Complaint No chief complaint on file.   HPI [name] is a 83 y.o. female.  The history is provided by the patient, the spouse and medical records.  Shortness of Breath  This is a new problem. The average episode lasts 2 hours. The problem occurs continuously.The current episode started 1 to 2 hours ago. The problem has been gradually worsening. Associated symptoms include cough. Pertinent negatives include no fever.    Past Medical History:  Diagnosis Date  .  Acute respiratiory failure requiring BiPap 06/24/2011  . Allergy   . Aortic valvular stenosis, moderate to severe 06/24/2011   a. 10/2012 Echo: mod AS, Valve 1.25 cm^2 (VTI), 1.19cm^2 (Vmax).  . Arthritis    "back" (10/30/2012)  . Chronic diastolic CHF (congestive heart failure) ([place])    a. 10/2012 Echo: EF 55-60%, no rwma, Gr 2 DD, moderate AS, mod dil LA, mildly to mod dil RA.  Marland Kitchen Coronary artery disease    a. 03/2009 PCI RCA (3.0x18 BMS).  . Dyslipidemia   . Exertional shortness of breath   . Hypertension   . Hypothyroidism   . PAF (paroxysmal atrial fibrillation) ([place])    a. CHA2DS2VASc = 7-->chronic coumadin;  b. s/p DCCV 05/2011, 05/2012, 07/2014;  c. On amio.  . Rash    a. felt to be 2/2 lasix/torsemide in setting of sulfa allergy (lasix d/c ~ 06/2014, torsemide d/c 08/09/2014).    Patient Active Problem List   Diagnosis Date Noted  . Long term current use of anticoagulant therapy 05/12/2015  . History of fracture of right hip 04/15/2015  . Senile purpura ([place]) 04/13/2015  . Angioimmunoblastic lymphoma ([place]) 04/05/2015  . PAF (paroxysmal atrial fibrillation) ([place])   . Arthritis 09/13/2014  . Intermittent LBBB 08/16/2014  . Drug-induced bradycardia 02/20/2014  . Prolonged Q-T interval on ECG 11/02/2012  . Aortic valvular stenosis, moderate to severe 06/24/2011  . Chronic diastolic heart failure ([place])  06/21/2011  . Hyperlipidemia 06/21/2011  . Hypertensive heart disease with CHF (congestive heart failure) ([place]) 06/21/2011  . Hypothyroidism, goitrous 06/21/2011  . Coronary artery disease 06/21/2011    Past Surgical History:  Procedure Laterality Date  . ANGIOPLASTY    . APPENDECTOMY    . BACK SURGERY    . CARDIAC CATHETERIZATION  10/06/2009   Continue medical therapy  . CARDIAC CATHETERIZATION  04/07/2009   RCA stented with a 3x67m stent resulting in a reduction of 90% narrowing to normal  . CARDIOVERSION  06/19/2011   Procedure: CARDIOVERSION;  Surgeon: MSanda Klein MD;  [facility];  Service: Cardiovascular;  Laterality: N/A;  . CARDIOVERSION  07/11/2010   Successful coversion to sinus rhythm  . CARDIOVERSION N/A 08/10/2014   Procedure: CARDIOVERSION;  Surgeon: MSanda Klein MD;  Location: [facility];  Service: Cardiovascular;  Laterality: N/A;  . CATARACT EXTRACTION W/ INTRAOCULAR LENS  IMPLANT, BILATERAL    . CORONARY ANGIOPLASTY WITH STENT PLACEMENT     "1" (10/30/2012)  . DILATION AND CURETTAGE OF UTERUS    . EXCISIONAL HEMORRHOIDECTOMY    . FRACTURE SURGERY    . HIP ARTHROPLASTY Right 04/17/2015   Procedure: HIP MONOPOLAR HIP HEMI ARTHROPLASTY;  Surgeon: MMarybelle Killings MD;  [facility]  Service: Orthopedics;  Laterality: Right;  . LUMBAR DISC SURGERY  X 2   "cleaned out scar tissue"   . SHOULDER ACROMIOPLASTY Left    "  fell; put a new ball in" (10/30/2012)  . WRIST FRACTURE SURGERY Left     OB History    No data available       Home Medications    Prior to Admission medications   Medication Sig Start Date End Date Taking? Authorizing Provider  acetaminophen (TYLENOL) 650 MG CR tablet Take 650 mg by mouth 3 (three) times daily. Morning, supper and bedtime    [provider]  albuterol (PROVENTIL HFA;VENTOLIN HFA) 108 (90 BASE) MCG/ACT inhaler Inhale 2 puffs into the lungs every 6 (six) hours as needed for wheezing or shortness of breath.     [provider]  atorvastatin (LIPITOR) 40 MG tablet TAKE 1 TABLET DAILY 08/26/16   Croitoru, Mihai, MD  ezetimibe (ZETIA) 10 MG tablet TAKE 1 TABLET DAILY (MAKE APPOINTMENT FOR FUTURE REFILLS) 05/23/16   Croitoru, Dani Gobble, MD  folic acid (FOLVITE) 1 MG tablet TAKE 2 TABLETS DAILY 02/08/16   Croitoru, Mihai, MD  ipratropium (ATROVENT) 0.03 % nasal spray Place 2 sprays into both nostrils 2 (two) times daily. Use as directed 02/20/16   [name], MD  levothyroxine (SYNTHROID, LEVOTHROID) 75 MCG tablet Take 1 tablet (75 mcg total) by mouth daily before breakfast. 12/19/15   [name], MD  loratadine (CLARITIN) 10 MG tablet Take 10 mg by mouth daily with breakfast.     [provider]  metoprolol tartrate (LOPRESSOR) 25 MG tablet Take 1 tablet (25 mg total) by mouth 2 (two) times daily. 05/31/16   Croitoru, Mihai, MD  Multiple Vitamin (MULTIVITAMIN WITH MINERALS) TABS tablet Take 1 tablet by mouth daily. Centrum Silver    [provider]  spironolactone (ALDACTONE) 25 MG tablet TAKE 1 TABLET DAILY 06/17/16   Croitoru, Dani Gobble, MD  warfarin (COUMADIN) 2.5 MG tablet Take 1 tablet by mouth daily or as directed by coumadin clinic 05/31/16   Croitoru, Mihai, MD    Family History Family History  Problem Relation Age of Onset  . Heart disease Brother   . Heart disease Brother   . Lung disease Brother   . Heart disease Mother 63  . Cancer Mother 75    Uterine  . Stroke Mother 25  . Heart failure Mother   . Hypertension Mother   . Heart disease Father   . Heart attack Father   . Hypertension Father   . Hypertension Child 34  . Alcohol abuse Child   . Cancer Son   . Hypertension Sister   . Hypertension Brother     Social History Social History  Substance Use Topics  . Smoking status: Never Smoker  . Smokeless tobacco: Never Used  . Alcohol use No     Allergies   [name] [ethacrynic acid]; Tape; Furosemide; Sulfa antibiotics; [name]; and  Torsemide   Review of Systems Review of Systems  Unable to perform ROS: Acuity of condition  Constitutional: Negative for fever.  Respiratory: Positive for cough and shortness of breath.      Physical Exam Updated Vital Signs There were no vitals taken for this visit.  Physical Exam  Constitutional: She appears well-developed and well-nourished. She appears distressed.  HENT:  Head: Normocephalic and atraumatic.  Eyes: Pupils are equal, round, and reactive to light.  Neck: Normal range of motion.  Cardiovascular: An irregularly irregular rhythm present. Tachycardia present.   Pulmonary/Chest: Accessory muscle usage present. No stridor. Tachypnea noted. She is in respiratory distress. She has wheezes. She has rales.  Abdominal: Soft. She exhibits no distension.  There is no tenderness.  Neurological: She is alert.  Skin: She is diaphoretic.  Nursing note and vitals reviewed.    ED Treatments / Results  Labs (all labs ordered are listed, but only abnormal results are displayed) Labs Reviewed  CBC WITH DIFFERENTIAL/PLATELET  COMPREHENSIVE METABOLIC PANEL  URINALYSIS, ROUTINE W REFLEX MICROSCOPIC  TROPONIN I  BRAIN NATRIURETIC PEPTIDE    EKG  EKG Interpretation None       Radiology No results found.  Procedures .Sedation Date/Time: 10/03/2016 2:43 PM Performed by: Merrily Pew Authorized by: Merrily Pew   Consent:    Consent obtained:  Verbal and emergent situation   Consent given by:  Patient and spouse   Risks discussed:  Allergic reaction, dysrhythmia, inadequate sedation, vomiting and prolonged hypoxia resulting in organ damage   Alternatives discussed:  Analgesia without sedation Indications:    Procedure performed:  Cardioversion   Procedure necessitating sedation performed by:  Physician performing sedation   Intended level of sedation:  Moderate (conscious sedation) Pre-sedation assessment:    NPO status caution: unable to specify NPO status      ASA classification: class 3 - patient with severe systemic disease     Neck mobility: normal     Mouth opening:  3 or more finger widths   Thyromental distance:  3 finger widths   Mallampati score:  III - soft palate, base of uvula visible   Pre-sedation assessments completed and reviewed: airway patency, cardiovascular function, hydration status and nausea/vomiting     Pre-sedation assessment completed:  10/02/2016 4:50 PM Immediate pre-procedure details:    Reassessment: Patient reassessed immediately prior to procedure     Reviewed: vital signs and relevant labs/tests     Verified: bag valve mask available, emergency equipment available, intubation equipment available, IV patency confirmed, oxygen available and reversal medications available   Procedure details (see MAR for exact dosages):    Sedation start time:  10/02/2016 4:56 PM   Preoxygenation:  Nasal cannula   Sedation:  Etomidate   Analgesia:  Fentanyl   Intra-procedure monitoring:  Blood pressure monitoring, continuous pulse oximetry, cardiac monitor, frequent vital sign checks and frequent LOC assessments   Intra-procedure events: none     Sedation end time:  10/02/2016 5:17 PM   Total sedation time (minutes):  22 .Cardioversion Date/Time: 10/03/2016 2:46 PM Performed by: Merrily Pew Authorized by: Merrily Pew   Consent:    Consent obtained:  Emergent situation   Consent given by:  Patient and spouse   Risks discussed:  Cutaneous burn, death, induced arrhythmia and pain   Alternatives discussed:  No treatment, alternative treatment and rate-control medication Pre-procedure details:    Cardioversion basis:  Emergent   Rhythm:  Atrial fibrillation   Electrode placement:  Anterior-posterior Attempt one:    Cardioversion mode:  Synchronous   Shock (Joules):  100   Shock outcome:  Conversion to normal sinus rhythm Attempt two:    Cardioversion mode:  Synchronous   Shock (Joules):  150   Shock outcome:  No change in  rhythm Attempt three:    Cardioversion mode:  Synchronous   Shock (Joules):  200   Shock outcome:  Conversion to other rhythm Post-procedure details:    Patient status:  Awake   Patient tolerance of procedure:  Tolerated well, no immediate complications   (including critical care time)  CRITICAL CARE Performed by: Merrily Pew Total critical care time: 45 minutes Critical care time was exclusive of separately billable procedures and treating other patients. Critical  care was necessary to treat or prevent imminent or life-threatening deterioration. Critical care was time spent personally by me on the following activities: development of treatment plan with patient and/or surrogate as well as nursing, discussions with consultants, evaluation of patient's response to treatment, examination of patient, obtaining history from patient or surrogate, ordering and performing treatments and interventions, ordering and review of laboratory studies, ordering and review of radiographic studies, pulse oximetry and re-evaluation of patient's condition.    Medications Ordered in ED Medications  etomidate (AMIDATE) injection 6 mg (not administered)  fentaNYL (SUBLIMAZE) injection 50 mcg (not administered)     Initial Impression / Assessment and Plan / ED Course  I have reviewed the triage vital signs and the nursing notes.  Pertinent labs & imaging results that were available during my care of the patient were reviewed by me and considered in my medical decision making (see chart for details).     83 yo F presented with acute pulmonary edema 2/2 acute afib w/ rvr that had started a couple hours prior to arrival. Was immediately sedated, attempted cardioversion along with giving IV metoprolol (on same at home) with subsequent intermittent lbbb (rate related?) and intermittent sinus rhythm. already on coumadin. Thought about starting bipap but did have vomiting and once HR controlled, her respiratory  status improved significantly. Cardiology consulted for admission and asked to admit to medicine but had put in recommendations which were followed. Had some soft pressures while on amio bolus so just continued infusion per cardiology recs.  Discussed with medicine for admission who asked that I re-consult cards for same.  Discussed with Dr. Haroldine Laws who maintained she should be admitted to medicine and they would consult.  Admitted to SDU with medicine in stable condition, afib rate controlled on amiodarone drip on [place].    CHA2DS2/VAS Stroke Risk Points      6 >= 2 Points: High Risk  1 - 1.99 Points: Medium Risk  0 Points: Low Risk    The patient's score has not changed in the past year.:  No Change         Details    Note: External data might be a factor in metrics not marked with    Points Metrics   This score determines the patient's risk of having a stroke if the  patient has atrial fibrillation.       1 Has Congestive Heart Failure:  Yes   1 Has Vascular Disease:  Yes   1 Has Hypertension:  Yes   2 Age:  62   0 Has Diabetes:  No   0 Had Stroke:  No Had TIA:  No Had thromboembolism:  No   1 Female:  Yes         Final Clinical Impressions(s) / ED Diagnoses   Final diagnoses:  Atrial fibrillation with rapid ventricular response ([place])  Acute pulmonary edema (HCC)  Acute respiratory failure with hypoxia [facility])    New Prescriptions New Prescriptions   No medications on file     Merrily Pew, MD 10/03/16 1453

## 2016-10-02 NOTE — Progress Notes (Signed)
Amiodarone Drug - Drug Interaction Consult Note  Recommendations: Monitor INR closely on warfarin. Monitor for muscle pain, weakness on atorvastatin. Monitor for bradycardia on Lopressor.  Amiodarone is metabolized by the cytochrome P450 system and therefore has the potential to cause many drug interactions. Amiodarone has an average plasma half-life of 50 days (range 20 to 100 days).   There is potential for drug interactions to occur several weeks or months after stopping treatment and the onset of drug interactions may be slow after initiating amiodarone.   '[x]'$  Statins: Increased risk of myopathy. Simvastatin- restrict dose to '20mg'$  daily. Other statins: counsel patients to report any muscle pain or weakness immediately. *Atorvastatin  '[x]'$  Anticoagulants: Amiodarone can increase anticoagulant effect. Consider warfarin dose reduction. Patients should be monitored closely and the dose of anticoagulant altered accordingly, remembering that amiodarone levels take several weeks to stabilize. *warfarin  '[]'$  Antiepileptics: Amiodarone can increase plasma concentration of phenytoin, the dose should be reduced. Note that small changes in phenytoin dose can result in large changes in levels. Monitor patient and counsel on signs of toxicity.  '[x]'$  Beta blockers: increased risk of bradycardia, AV block and myocardial depression. Sotalol - avoid concomitant use. *Lopressor  '[]'$   Calcium channel blockers (diltiazem and verapamil): increased risk of bradycardia, AV block and myocardial depression.  '[]'$   Cyclosporine: Amiodarone increases levels of cyclosporine. Reduced dose of cyclosporine is recommended.  '[]'$  Digoxin dose should be halved when amiodarone is started.  '[]'$  Diuretics: increased risk of cardiotoxicity if hypokalemia occurs.  '[]'$  Oral hypoglycemic agents (glyburide, glipizide, glimepiride): increased risk of hypoglycemia. Patient's glucose levels should be monitored closely when initiating  amiodarone therapy.   '[]'$  Drugs that prolong the QT interval:  Torsades de pointes risk may be increased with concurrent use - avoid if possible.  Monitor QTc, also keep magnesium/potassium WNL if concurrent therapy can't be avoided. Marland Kitchen Antibiotics: e.g. fluoroquinolones, erythromycin. . Antiarrhythmics: e.g. quinidine, procainamide, disopyramide, sotalol. . Antipsychotics: e.g. phenothiazines, haloperidol.  . Lithium, tricyclic antidepressants, and methadone. Thank You,    [name], PharmD, BCPS Clinical Pharmacist 10/02/2016 6:31 PM

## 2016-10-02 NOTE — Consult Note (Signed)
CARDIOLOGY CONSULT NOTE   Patient ID: [name] MRN: [id number], DOB/AGE: Jan 02, 1924   Admit date: 10/02/2016 Date of Consult: 10/02/2016  Primary Physician: [name], MD Primary Cardiologist: Dr Sallyanne Kuster  Reason for consult:  CHF, a-fib with RVR  Problem List  Past Medical History:  Diagnosis Date  .  Acute respiratiory failure requiring BiPap 06/24/2011  . Allergy   . Aortic valvular stenosis, moderate to severe 06/24/2011   a. 10/2012 Echo: mod AS, Valve 1.25 cm^2 (VTI), 1.19cm^2 (Vmax).  . Arthritis    "back" (10/30/2012)  . Chronic diastolic CHF (congestive heart failure) ([place])    a. 10/2012 Echo: EF 55-60%, no rwma, Gr 2 DD, moderate AS, mod dil LA, mildly to mod dil RA.  Marland Kitchen Coronary artery disease    a. 03/2009 PCI RCA (3.0x18 BMS).  . Dyslipidemia   . Exertional shortness of breath   . Hypertension   . Hypothyroidism   . PAF (paroxysmal atrial fibrillation) ([place])    a. CHA2DS2VASc = 7-->chronic coumadin;  b. s/p DCCV 05/2011, 05/2012, 07/2014;  c. On amio.  . Rash    a. felt to be 2/2 lasix/torsemide in setting of sulfa allergy (lasix d/c ~ 06/2014, torsemide d/c 08/09/2014).    Past Surgical History:  Procedure Laterality Date  . ANGIOPLASTY    . APPENDECTOMY    . BACK SURGERY    . CARDIAC CATHETERIZATION  10/06/2009   Continue medical therapy  . CARDIAC CATHETERIZATION  04/07/2009   RCA stented with a 3x41m stent resulting in a reduction of 90% narrowing to normal  . CARDIOVERSION  06/19/2011   Procedure: CARDIOVERSION;  Surgeon: MSanda Klein MD;  Location: [facility];  Service: Cardiovascular;  Laterality: N/A;  . CARDIOVERSION  07/11/2010   Successful coversion to sinus rhythm  . CARDIOVERSION N/A 08/10/2014   Procedure: CARDIOVERSION;  Surgeon: MSanda Klein MD;  [facility];  Service: Cardiovascular;  Laterality: N/A;  . CATARACT EXTRACTION W/ INTRAOCULAR LENS  IMPLANT, BILATERAL    . CORONARY ANGIOPLASTY WITH STENT PLACEMENT     "1"  (10/30/2012)  . DILATION AND CURETTAGE OF UTERUS    . EXCISIONAL HEMORRHOIDECTOMY    . FRACTURE SURGERY    . HIP ARTHROPLASTY Right 04/17/2015   Procedure: HIP MONOPOLAR HIP HEMI ARTHROPLASTY;  Surgeon: MMarybelle Killings MD;  [facility]  Service: Orthopedics;  Laterality: Right;  . LUMBAR DISC SURGERY  X 2   "cleaned out scar tissue"   . SHOULDER ACROMIOPLASTY Left    "fell; put a new ball in" (10/30/2012)  . WRIST FRACTURE SURGERY Left      Allergies  Allergies  Allergen Reactions  . [name] [Ethacrynic Acid] Nausea And Vomiting  . Tape Other (See Comments)    Tears skin off.  Please use "paper" tape only.  . Furosemide Rash  . Sulfa Antibiotics Itching and Rash  . [name] Rash    Per Dr CSallyanne Kuster had a skin reaction with thiazides prior to use of Lasix  . Torsemide Rash    HPI   Sheryl BROWNSTEINis a 83y.o. female with chronic diastolic heart failure, coronary artery disease, persistent atrial fibrillation (at least 3 prior DCCV, in persistent a-fib since 2016) on chronic anticoagulation with warfarin, hyperlipidemia. She has not had neurological events or bleeding complications. She and her husband are fully independent. She has h/o CAD, S/P PCI with BMS to to RCA in 2010. She has not had significant angina pectoris since.  She has  h/o diastolic heart failure with a remote acute exacerbation in June 2014 and March 2016. Her echo shows an ejection fraction of 55-60% and pseudo-normal mitral inflow. She had moderate aortic stenosis (estimated valve area 1.2 cm square, mean gradient 18 mm Hg, echo Sep 2016). Her treatment was complicated by severe weeks lasting rash with sulfate drugs including furosemide and torsemide and nausea vomiting with ethacrynic acid. She was on spironolactone at home. She has a history of recurrent paroxysmal atrial fibrillation and has required cardioversion three times in the past. She is on chronic warfarin anticoagulation and has not had  stroke or bleeding complications. After her last cardioversion in March 2016, despite a higher dose of amiodarone she still reverted to atrial fibrillation and we are now treating this conservatively, with rate control.  The patient remains fairly active for her age. Today she developed rapid onset palpitations and dizziness, she had witnessed presyncopal episode without loss of consciousness, she was brought to the ER where she was found to be in rapid a-fib with hypotension and desaturation down to 60'. ER physician shocked her 4x with few seconds lasting SR, but went back to a-fib with ventricular rates 90-110'.   She is currently feeling little better, she denies any fever, chills at home, no chest pain, no SOB prior to this event, stable mild LE edema, no orthopnea, PND. Mild postnasal drip with throat isching. No dysuria.   Labs are pending. Baseline Crea 1.1  TTE in 01/2015 LVEF 55% to 60%. Moderate aortic stenosis, mean gradient 22 mmHg Mild mitral regurgitation.  The atrium was mildly dilated. RVEF normal  Inpatient Medications  . amiodarone  150 mg Intravenous Once  . ethacrynic acid  75 mg Intravenous BID  . etomidate  6 mg Intravenous Once  . fentaNYL (SUBLIMAZE) injection  50 mcg Intravenous Once  . ipratropium  0.5 mg Nebulization Q4H  . methylPREDNISolone (SOLU-MEDROL) injection  60 mg Intravenous Q12H  . metoprolol      . ondansetron        Family History Family History  Problem Relation Age of Onset  . Heart disease Brother   . Heart disease Brother   . Lung disease Brother   . Heart disease Mother 24  . Cancer Mother 16    Uterine  . Stroke Mother 25  . Heart failure Mother   . Hypertension Mother   . Heart disease Father   . Heart attack Father   . Hypertension Father   . Hypertension Child 34  . Alcohol abuse Child   . Cancer Son   . Hypertension Sister   . Hypertension Brother      Social History Social History   Social History  . Marital  status: Married    Spouse name: N/A  . Number of children: 1  . Years of education: N/A   Occupational History  . Retired [employer]   Social History Main Topics  . Smoking status: Never Smoker  . Smokeless tobacco: Never Used  . Alcohol use No  . Drug use: No  . Sexual activity: No   Other Topics Concern  . Not on file   Social History Narrative   Lives in [place], [place]. She got married in 1948.     Review of Systems  General:  No chills, fever, night sweats or weight changes.  Cardiovascular:  No chest pain, dyspnea on exertion, edema, orthopnea, palpitations, paroxysmal nocturnal dyspnea. Dermatological: No rash, lesions/masses Respiratory: No cough, dyspnea Urologic: No hematuria, dysuria  Abdominal:   No nausea, vomiting, diarrhea, bright red blood per rectum, melena, or hematemesis Neurologic:  No visual changes, wkns, changes in mental status. All other systems reviewed and are otherwise negative except as noted above.  Physical Exam  Blood pressure 107/79, pulse (!) 112, temperature 97.9 F (36.6 C), temperature source Oral, resp. rate 18, SpO2 97 %.  General: Pleasant, NAD Psych: Normal affect. Neuro: Alert and oriented X 3. Moves all extremities spontaneously. HEENT: Normal  Neck: JVD + 6 cm B/L. Lungs:  Resp regular and unlabored, wheezing and crackles up to mid lungs Heart: irregular, 4/6 systolic murmur, ? S2. Abdomen: Soft, non-tender, non-distended, BS + x 4.  Extremities: No clubbing, cyanosis or mild nonpitting LE edema B/L. DP/PT/Radials 2+ and equal bilaterally.  Labs  No results for input(s): CKTOTAL, CKMB, TROPONINI in the last 72 hours. Lab Results  Component Value Date   WBC 6.4 07/09/2016   HGB 12.1 07/09/2016   HCT 37.2 07/09/2016   MCV 88.7 07/09/2016   PLT 258 07/09/2016   No results for input(s): NA, K, CL, CO2, BUN, CREATININE, CALCIUM, PROT, BILITOT, ALKPHOS, ALT, AST, GLUCOSE in the last 168 hours.  Invalid input(s):  LABALBU Lab Results  Component Value Date   CHOL 156 05/10/2016   HDL 40 (L) 05/10/2016   LDLCALC 88 05/10/2016   TRIG 141 05/10/2016   Radiology/Studies  Dg Chest Portable 1 View  Result Date: 10/02/2016 CLINICAL DATA:  Irregular heartbeat, history CHF, hypertension, atrial fibrillation, coronary artery disease EXAM: PORTABLE CHEST 1 VIEW COMPARISON:  Portable exam 1721 hours compared to 04/15/2015 FINDINGS: External pacing leads project over chest. Enlargement of cardiac silhouette with pulmonary vascular congestion. Atherosclerotic calcification aorta. Mild perihilar infiltrates likely representing pulmonary edema. Underlying emphysematous changes. No definite pleural effusion or pneumothorax. Bones demineralized with note of a LEFT shoulder prosthesis. IMPRESSION: Question mild CHF. Electronically Signed   By: Lavonia Dana M.D.   On: 10/02/2016 17:32   ECG: a-fib with RVR, IVCD    ASSESSMENT AND PLAN  1. Atrial fibrillation with RVR - start amiodarone for rate control as she remains tachycardic and hypotensive - she has been compliant with warfarin  2. Acute on chronic diastolic CHF - the patient had moderate aortic stenosis in 2016, possibly severe now, causing rapid decompensation with fast heart rates - situation complicated by allergies with diuretics - severe rash with furosemide and torsemide and nausea with ethacrynic acid, we will give ethacrynic acid 75 mg tonight with zofran 4 mg iv and reevaluate in the morning  3. Aortic stenosis - order echocardiogram to reevaluate severity  4. CAD - asymptomatic, on zetia at home, not on aspirin as she is on warfarin and had advanced age   Signed, Ena Dawley, MD, [facility] 10/02/2016, 6:26 PM

## 2016-10-02 NOTE — ED Notes (Signed)
Troponin 0.04 per main lab. MD notified.

## 2016-10-02 NOTE — ED Notes (Signed)
Amiodarone drip paused. MD notified of pt BP in the 75'Q systolic. 492EF bolus NS initiated per MD order. Will cont to monitor.

## 2016-10-03 ENCOUNTER — Inpatient Hospital Stay (HOSPITAL_COMMUNITY): Payer: Medicare Other

## 2016-10-03 DIAGNOSIS — I360 Nonrheumatic tricuspid (valve) stenosis: Secondary | ICD-10-CM

## 2016-10-03 DIAGNOSIS — L8990 Pressure ulcer of unspecified site, unspecified stage: Secondary | ICD-10-CM | POA: Insufficient documentation

## 2016-10-03 LAB — BASIC METABOLIC PANEL
ANION GAP: 9 (ref 5–15)
BUN: 18 mg/dL (ref 6–20)
CHLORIDE: 104 mmol/L (ref 101–111)
CO2: 23 mmol/L (ref 22–32)
Calcium: 9 mg/dL (ref 8.9–10.3)
Creatinine, Ser: 0.98 mg/dL (ref 0.44–1.00)
GFR calc non Af Amer: 49 mL/min — ABNORMAL LOW (ref >60)
GFR, EST AFRICAN AMERICAN: 57 mL/min — AB (ref >60)
Glucose, Bld: 170 mg/dL — ABNORMAL HIGH (ref 65–99)
Potassium: 4.4 mmol/L (ref 3.5–5.1)
Sodium: 136 mmol/L (ref 135–145)

## 2016-10-03 LAB — PROTIME-INR
INR: 2.32
Prothrombin Time: 25.9 seconds — ABNORMAL HIGH (ref 11.4–15.2)

## 2016-10-03 LAB — CBC
HEMATOCRIT: 38.1 % (ref 36.0–46.0)
Hemoglobin: 11.9 g/dL — ABNORMAL LOW (ref 12.0–15.0)
MCH: 28.7 pg (ref 26.0–34.0)
MCHC: 31.2 g/dL (ref 30.0–36.0)
MCV: 92 fL (ref 78.0–100.0)
Platelets: 218 10*3/uL (ref 150–400)
RBC: 4.14 MIL/uL (ref 3.87–5.11)
RDW: 14.5 % (ref 11.5–15.5)
WBC: 16.1 10*3/uL — AB (ref 4.0–10.5)

## 2016-10-03 LAB — ECHOCARDIOGRAM COMPLETE
HEIGHTINCHES: 65 in
Weight: 2416 oz

## 2016-10-03 LAB — MRSA PCR SCREENING: MRSA BY PCR: NEGATIVE

## 2016-10-03 MED ORDER — ETHACRYNIC ACID 25 MG PO TABS
100.0000 mg | ORAL_TABLET | Freq: Two times a day (BID) | ORAL | Status: DC
  Administered 2016-10-03 – 2016-10-05 (×4): 100 mg via ORAL
  Filled 2016-10-03 (×4): qty 4

## 2016-10-03 MED ORDER — SPIRONOLACTONE 25 MG PO TABS
25.0000 mg | ORAL_TABLET | Freq: Every day | ORAL | Status: DC
  Administered 2016-10-03 – 2016-10-08 (×6): 25 mg via ORAL
  Filled 2016-10-03 (×6): qty 1

## 2016-10-03 MED ORDER — LORAZEPAM 0.5 MG PO TABS
0.5000 mg | ORAL_TABLET | Freq: Two times a day (BID) | ORAL | Status: DC | PRN
  Administered 2016-10-03: 0.5 mg via ORAL
  Filled 2016-10-03 (×2): qty 1

## 2016-10-03 MED ORDER — WARFARIN SODIUM 2.5 MG PO TABS
2.5000 mg | ORAL_TABLET | Freq: Once | ORAL | Status: AC
  Administered 2016-10-03: 2.5 mg via ORAL
  Filled 2016-10-03: qty 1

## 2016-10-03 MED ORDER — SALINE SPRAY 0.65 % NA SOLN
1.0000 | NASAL | Status: DC | PRN
  Administered 2016-10-03: 1 via NASAL
  Filled 2016-10-03: qty 44

## 2016-10-03 MED ORDER — WARFARIN - PHARMACIST DOSING INPATIENT: Freq: Every day | Status: DC

## 2016-10-03 MED ORDER — SPIRONOLACTONE 25 MG PO TABS
25.0000 mg | ORAL_TABLET | Freq: Every day | ORAL | Status: DC
Start: 2016-10-03 — End: 2016-10-03

## 2016-10-03 NOTE — ED Notes (Signed)
MD notified of pt condition on arrival.

## 2016-10-03 NOTE — ED Notes (Signed)
Pt in NSR for approx 30-45 seconds then converted back to Afib RVR.

## 2016-10-03 NOTE — H&P (Addendum)
History and Physical    PALLAS WAHLERT TDD:[id number] DOB: 1923-11-06 DOA: 10/02/2016  Referring MD/NP/PA: Dr. Dayna Barker PCP: [name], MD  Patient coming from: Home via EMS  Chief Complaint: Shortness of breath  HPI: SANTANA GOSDIN is a 83 y.o. female with medical history significant of Chronic dCHF, chronic A. fib on anticoagulation, mod AS, HTN, CAD s/p PCI 2010, hypothyroidism; who presented with acute onset of shortness of breath. At baseline the patient reports not being on oxygen at home and had been taking all of her medications as prescribed. While sitting at home she reports feeling her heat racing. Shortly thereafter she began to feel short of breath. Denies trying anything to relieve symptoms. She called out to her husband stating that she did not feel well. He checked her blood pressure and possibly her pulse was noted to be in the 150s and he immediately called EMS. She denies having any chest pain, significant leg swelling, change in weight, dysuria, or recent sick contacts.  ED Course: En route with EMS patient was noted to be diaphoretic, hypertensive, and on NRB mask. She was noted have one episode of emesis upon arrival. Upon admission into the emergency department patient was seen to be afebrile, heart rates up to 167, respirations up to 30s, blood pressure as low as 78/65, and O2 saturations initially noted as low as 60s. Patient was initially seen to be in A. fib with RVR with O2 saturations into the 60s patient was shocked 4 times, but subsequently went back into A. fib with heart rates 90-110. Cardiology was consulted and started patient on amiodarone drip and to be given [name] for diuresis as unable to tolerate other diuretics.   Review of Systems: As per HPI otherwise 10 point review of systems negative.   Past Medical History:  Diagnosis Date  .  Acute respiratiory failure requiring BiPap 06/24/2011  . Allergy   . Aortic valvular stenosis, moderate to severe  06/24/2011   a. 10/2012 Echo: mod AS, Valve 1.25 cm^2 (VTI), 1.19cm^2 (Vmax).  . Arthritis    "back" (10/30/2012)  . Chronic diastolic CHF (congestive heart failure) ([place])    a. 10/2012 Echo: EF 55-60%, no rwma, Gr 2 DD, moderate AS, mod dil LA, mildly to mod dil RA.  Marland Kitchen Coronary artery disease    a. 03/2009 PCI RCA (3.0x18 BMS).  . Dyslipidemia   . Exertional shortness of breath   . Hypertension   . Hypothyroidism   . PAF (paroxysmal atrial fibrillation) ([place])    a. CHA2DS2VASc = 7-->chronic coumadin;  b. s/p DCCV 05/2011, 05/2012, 07/2014;  c. On amio.  . Rash    a. felt to be 2/2 lasix/torsemide in setting of sulfa allergy (lasix d/c ~ 06/2014, torsemide d/c 08/09/2014).    Past Surgical History:  Procedure Laterality Date  . ANGIOPLASTY    . APPENDECTOMY    . BACK SURGERY    . CARDIAC CATHETERIZATION  10/06/2009   Continue medical therapy  . CARDIAC CATHETERIZATION  04/07/2009   RCA stented with a 3x58m stent resulting in a reduction of 90% narrowing to normal  . CARDIOVERSION  06/19/2011   Procedure: CARDIOVERSION;  Surgeon: MSanda Klein MD;  [facility];  Service: Cardiovascular;  Laterality: N/A;  . CARDIOVERSION  07/11/2010   Successful coversion to sinus rhythm  . CARDIOVERSION N/A 08/10/2014   Procedure: CARDIOVERSION;  Surgeon: MSanda Klein MD;  [facility];  Service: Cardiovascular;  Laterality: N/A;  . CATARACT EXTRACTION W/ INTRAOCULAR  LENS  IMPLANT, BILATERAL    . CORONARY ANGIOPLASTY WITH STENT PLACEMENT     "1" (10/30/2012)  . DILATION AND CURETTAGE OF UTERUS    . EXCISIONAL HEMORRHOIDECTOMY    . FRACTURE SURGERY    . HIP ARTHROPLASTY Right 04/17/2015   Procedure: HIP MONOPOLAR HIP HEMI ARTHROPLASTY;  Surgeon: Marybelle Killings, MD;  Location: [facility];  Service: Orthopedics;  Laterality: Right;  . LUMBAR DISC SURGERY  X 2   "cleaned out scar tissue"   . SHOULDER ACROMIOPLASTY Left    "fell; put a new ball in" (10/30/2012)  . WRIST FRACTURE SURGERY Left       reports that she has never smoked. She has never used smokeless tobacco. She reports that she does not drink alcohol or use drugs.  Allergies  Allergen Reactions  . [name] [Ethacrynic Acid] Nausea And Vomiting  . Tape Other (See Comments)    Tears skin off.  Please use "paper" tape only.  . Furosemide Rash  . Sulfa Antibiotics Itching and Rash  . [name] Rash    Per Dr Sallyanne Kuster, had a skin reaction with thiazides prior to use of Lasix  . Torsemide Rash    Family History  Problem Relation Age of Onset  . Heart disease Brother   . Heart disease Brother   . Lung disease Brother   . Heart disease Mother 15  . Cancer Mother 16       Uterine  . Stroke Mother 54  . Heart failure Mother   . Hypertension Mother   . Heart disease Father   . Heart attack Father   . Hypertension Father   . Hypertension Child 34  . Alcohol abuse Child   . Cancer Son   . Hypertension Sister   . Hypertension Brother     Prior to Admission medications   Medication Sig Start Date End Date Taking? Authorizing Provider  acetaminophen (TYLENOL) 650 MG CR tablet Take 650 mg by mouth 3 (three) times daily. Morning, supper and bedtime   Yes [provider]  atorvastatin (LIPITOR) 40 MG tablet TAKE 1 TABLET DAILY Patient taking differently: TAKE 1 TABLET at bedtime 08/26/16  Yes Croitoru, Mihai, MD  folic acid (FOLVITE) 1 MG tablet TAKE 2 TABLETS DAILY 02/08/16  Yes Croitoru, Mihai, MD  levothyroxine (SYNTHROID, LEVOTHROID) 75 MCG tablet Take 1 tablet (75 mcg total) by mouth daily before breakfast. Patient taking differently: Take 88 mcg by mouth daily before breakfast.  12/19/15  Yes [name], MD  loratadine (CLARITIN) 10 MG tablet Take 10 mg by mouth daily.   Yes [provider]  metoprolol tartrate (LOPRESSOR) 25 MG tablet Take 1 tablet (25 mg total) by mouth 2 (two) times daily. 05/31/16  Yes Croitoru, Mihai, MD  Multiple Vitamin (MULTIVITAMIN WITH MINERALS) TABS  tablet Take 1 tablet by mouth daily. Centrum Silver   Yes [provider]  spironolactone (ALDACTONE) 25 MG tablet TAKE 1 TABLET DAILY 06/17/16  Yes Croitoru, Mihai, MD  warfarin (COUMADIN) 2.5 MG tablet Take 1 tablet by mouth daily or as directed by coumadin clinic 05/31/16  Yes Croitoru, Mihai, MD  ezetimibe (ZETIA) 10 MG tablet TAKE 1 TABLET DAILY (MAKE APPOINTMENT FOR FUTURE REFILLS) Patient not taking: Reported on 10/02/2016 05/23/16   Croitoru, Mihai, MD  ipratropium (ATROVENT) 0.03 % nasal spray Place 2 sprays into both nostrils 2 (two) times daily. Use as directed Patient not taking: Reported on 10/02/2016 02/20/16   [name], MD  Physical Exam:    Constitutional: Elderly female in NAD, calm, comfortable Vitals:   10/02/16 2200 10/02/16 2215 10/02/16 2244 10/02/16 2258  BP: 97/61 (!) 111/92  112/65  Pulse: 73 (!) 111  (!) 120  Resp: 20 (!) 23  20  Temp:   97.6 F (36.4 C) 97.6 F (36.4 C)  TempSrc:   Oral Oral  SpO2: 100% 99%  93%  Weight:    68.8 kg (151 lb 9.6 oz)  Height:    '5\' 5"'$  (1.651 m)   Eyes: PERRL, lids and conjunctivae normal ENMT: Mucous membranes are moist. Posterior pharynx clear of any exudate or lesions.Normal dentition.  Neck: normal, supple, no masses, no thyromegaly Respiratory: mildly tachypneic with crackles appreciated. Patient currently on 2 L nasal cannula oxygen maintaining O2 saturations. Cardiovascular: irregular irregular with positive systolic ejection murmur. No rubs / gallops. 2+ pitting lower extremity edema. 2+ pedal pulses. No carotid bruits.  Abdomen: no tenderness, no masses palpated. No hepatosplenomegaly. Bowel sounds positive.  Musculoskeletal: no clubbing / cyanosis. No joint deformity upper and lower extremities. Good ROM, no contractures. Normal muscle tone.  Skin: no rashes, lesions, ulcers. No induration Neurologic: CN 2-12 grossly intact. Sensation intact, DTR normal. Strength 5/5 in all 4.  Psychiatric: Normal  judgment and insight. Alert and oriented x 3. Normal mood.     Labs on Admission: I have personally reviewed following labs and imaging studies  CBC:  Recent Labs Lab 10/02/16 1735  WBC 17.0*  NEUTROABS 14.6*  HGB 11.8*  HCT 38.1  MCV 92.5  PLT 301   Basic Metabolic Panel:  Recent Labs Lab 10/02/16 1735  NA 138  K 4.1  CL 106  CO2 24  GLUCOSE 135*  BUN 17  CREATININE 1.01*  CALCIUM 9.0   GFR: Estimated Creatinine Clearance: 36.1 mL/min (A) (by C-G formula based on SCr of 1.01 mg/dL (H)). Liver Function Tests:  Recent Labs Lab 10/02/16 1735  AST 25  ALT 15  ALKPHOS 85  BILITOT 1.2  PROT 7.4  ALBUMIN 3.9   No results for input(s): LIPASE, AMYLASE in the last 168 hours. No results for input(s): AMMONIA in the last 168 hours. Coagulation Profile:  Recent Labs Lab 10/02/16 2230  INR 2.51   Cardiac Enzymes:  Recent Labs Lab 10/02/16 1735 10/02/16 2316  TROPONINI 0.04* 0.10*   BNP (last 3 results) No results for input(s): PROBNP in the last 8760 hours. HbA1C: No results for input(s): HGBA1C in the last 72 hours. CBG: No results for input(s): GLUCAP in the last 168 hours. Lipid Profile: No results for input(s): CHOL, HDL, LDLCALC, TRIG, CHOLHDL, LDLDIRECT in the last 72 hours. Thyroid Function Tests:  Recent Labs  10/02/16 2230  TSH 4.760*   Anemia Panel: No results for input(s): VITAMINB12, FOLATE, FERRITIN, TIBC, IRON, RETICCTPCT in the last 72 hours. Urine analysis:    Component Value Date/Time   COLORURINE YELLOW 11/01/2012 1849   APPEARANCEUR HAZY (A) 11/01/2012 1849   LABSPEC 1.017 11/01/2012 1849   PHURINE 6.0 11/01/2012 1849   GLUCOSEU NEGATIVE 11/01/2012 1849   HGBUR LARGE (A) 11/01/2012 1849   BILIRUBINUR NEGATIVE 11/01/2012 [place] 11/01/2012 1849   PROTEINUR NEGATIVE 11/01/2012 1849   UROBILINOGEN 1.0 11/01/2012 1849   NITRITE NEGATIVE 11/01/2012 1849   LEUKOCYTESUR MODERATE (A) 11/01/2012 1849    Sepsis Labs: No results found for this or any previous visit (from the past 240 hour(s)).   Radiological Exams on Admission: Dg Chest Portable 1 View  Result Date: 10/02/2016 CLINICAL DATA:  Irregular heartbeat, history CHF, hypertension, atrial fibrillation, coronary artery disease EXAM: PORTABLE CHEST 1 VIEW COMPARISON:  Portable exam 1721 hours compared to 04/15/2015 FINDINGS: External pacing leads project over chest. Enlargement of cardiac silhouette with pulmonary vascular congestion. Atherosclerotic calcification aorta. Mild perihilar infiltrates likely representing pulmonary edema. Underlying emphysematous changes. No definite pleural effusion or pneumothorax. Bones demineralized with note of a LEFT shoulder prosthesis. IMPRESSION: Question mild CHF. Electronically Signed   By: Lavonia Dana M.D.   On: 10/02/2016 17:32    EKG: Independently reviewed. Atrial fibrillation with subtle ST changes in the inferior leads.  Assessment/Plan Paroxysmal atrial fibrillation on chronic anticoagulation: Patient presented in A. fib with RVR with heart rates into the 160s. Patient was electrically cardioverted at least 4 times all in the ED, but remained in persistent A. fib. Cardiology consulted and recommended placement on patient on amiodarone drip. Patient unable to tolerate bolus due to hypotension. - Admit to stepdown - A. fib order set initiated - Check PT/INR  - Coumadin and per pharmacy - Amiodarone drip as tolerated - Check echocardiogram - Continue amiodarone drip as tolerated  -Appreciate cardiology consultative services follow-up for further recommendations   Elevated cardiac troponin: Could be secondary to supply demand with patient having rapid atrial fibrillation. Subtle ST elevations - Trend cardiac troponin  Hypotension: Acute. Initial blood pressures noted to be as low as 78/65. Blood pressures improved after able to get heart rates under better control. - Continue to  monitor  Diastolic CHF exacerbation with H/O moderate Aortic stenosis: Acute. Last EF noted to be 55-60% in 01/2015.  - strict ins and outs and daily weights - F/u echo - [name] acid 75 mg to be given x1 dose, cardiology to eval and determine further diuresis and a.m.   Leukocytosis: Acute. WBC elevated at 17 on admission - Follow-up urinalysis - Recheck CBC in a.m.   Nausea and vomiting - Zofran prn N/V   CAD s/p PCI: Patient underwent placement of bare-metal stent to RCA in 2010.   Anemia: Hemoglobin stable at 11.8 - Continue to monitor  Hyperlipidemia - Continue atorvastatin  Hypothyroidism - Check TSH - Continue Levothyroxine  DVT prophylaxis: Coumadin   Code Status:Full  Family Communication: Present at bedside Disposition Plan: Likely discharge home once medically stable  Consults called: Cardiology Admission status: Inpatient

## 2016-10-03 NOTE — Progress Notes (Signed)
[facility] Progress Note  Patient: [name] HWE:[id number]   PCP: [name], MD DOB: 31-May-1923   DOA: 10/02/2016   DOS: 10/03/2016   Date of Service: the patient was seen and examined on 10/03/2016  Subjective: feeling better but still significantly short of breath and has some Occasional cough, no fever or chills or dizziness  Brief hospital course: Pt. with PMH of Chronic dCHF, chronic A. fib on anticoagulation, mod AS, HTN, CAD s/p PCI 2010, hypothyroidism; admitted on 10/02/2016, presented with complaint of shortnes of breath, was found to have A fib with RVR and acute on chronic CHF. Currently further plan is continue rate control and diuresis.  Assessment and Plan: Paroxysmal atrialfibrillation on chronic anticoagulation Acute on chronic diastolic dysfunction Patient presented in A. fib with RVR with heart rates into the 160s. Patient was electrically cardioverted at least 4 times all in the ED, but remained in persistent A. fib. Cardiology consulted and recommended placement on patient on amiodarone drip. Patient unable to tolerate bolus due to hypotension. Coumadin and per pharmacy Amiodarone drip for now and later change to oral per cardiology  Echocardiogram suggest no new changes and preserved EF Appreciate cardiology consultative services. Continue [name] acid.  Elevated cardiac troponin- demand ischemia  Could be secondary to supply demand with patient having rapid atrial fibrillation. Subtle ST elevations Follow troponin, on coumadin therapeutic INR,   Hypotension: Acute. Initial blood pressures noted to be as low as 78/65. Blood pressures improved after able to get heart rates under better control. - Continue to monitor  Diastolic CHF exacerbation with H/O moderate Aortic stenosis:Acute. Last EF noted to be 55-60% in9/2016.  - strict ins and outs and daily weights - F/u echo - [name] acid 75 mg to be given x1 dose, cardiology to eval and determine  further diuresis and a.m.  Nausea and vomiting - Zofran prn N/V   CAD s/p PCI: Patient underwent placement of bare-metal stent to RCA in 2010.   Anemia: Hemoglobin stable - Continue to monitor  Hyperlipidemia - Continue atorvastatin  Hypothyroidism - mildly elevated TSH, check free t4.  - Continue Levothyroxine  Diet: cardiac diet DVT Prophylaxis: on therapeutic anticoagulation.  Advance goals of care discussion: full code  Family Communication: no family was present at bedside, at the time of interview.   Disposition:  Discharge to home vs SNF.  Consultants: cardiology Procedures: Echocardiogram  Antibiotics: Anti-infectives    None       Objective: Physical Exam: Vitals:   10/02/16 2244 10/02/16 2258 10/03/16 0400 10/03/16 0900  BP:  112/65 108/60 (!) 103/51  Pulse:  (!) 120 (!) 108   Resp:  20 18   Temp: 97.6 F (36.4 C) 97.6 F (36.4 C) 97.4 F (36.3 C)   TempSrc: Oral Oral Oral   SpO2:  93% 96%   Weight:  68.8 kg (151 lb 9.6 oz) 68.5 kg (151 lb)   Height:  '5\' 5"'$  (1.651 m)      Intake/Output Summary (Last 24 hours) at 10/03/16 1317 Last data filed at 10/03/16 0900  Gross per 24 hour  Intake           486.35 ml  Output              400 ml  Net            86.35 ml   Filed Weights   10/02/16 2258 10/03/16 0400  Weight: 68.8 kg (151 lb 9.6 oz) 68.5 kg (151 lb)  General: Alert, Awake and Oriented to Time, Place and Person. Appear in moderate distress, affect appropriate Eyes: PERRL, Conjunctiva normal ENT: Oral Mucosa clear moist. Neck: positive JVD, no Abnormal Mass Or lumps Cardiovascular: S1 and S2 Present, no Murmur, Respiratory: Bilateral Air entry equal and Decreased, no use of accessory muscle, bilateral Crackles, no wheezes Abdomen: Bowel Sound present, Soft and no tenderness Skin: no redness, no Rash, no induration Extremities: bilateral Pedal edema, no calf tenderness Neurologic: Grossly no focal neuro deficit. Bilaterally Equal  motor strength  Data Reviewed: CBC:  Recent Labs Lab 10/02/16 1735 10/03/16 0331  WBC 17.0* 16.1*  NEUTROABS 14.6*  --   HGB 11.8* 11.9*  HCT 38.1 38.1  MCV 92.5 92.0  PLT 240 867   Basic Metabolic Panel:  Recent Labs Lab 10/02/16 1735 10/03/16 0331  NA 138 136  K 4.1 4.4  CL 106 104  CO2 24 23  GLUCOSE 135* 170*  BUN 17 18  CREATININE 1.01* 0.98  CALCIUM 9.0 9.0    Liver Function Tests:  Recent Labs Lab 10/02/16 1735  AST 25  ALT 15  ALKPHOS 85  BILITOT 1.2  PROT 7.4  ALBUMIN 3.9   No results for input(s): LIPASE, AMYLASE in the last 168 hours. No results for input(s): AMMONIA in the last 168 hours. Coagulation Profile:  Recent Labs Lab 10/02/16 2230 10/03/16 0331  INR 2.51 2.32   Cardiac Enzymes:  Recent Labs Lab 10/02/16 1735 10/02/16 2316  TROPONINI 0.04* 0.10*   BNP (last 3 results) No results for input(s): PROBNP in the last 8760 hours. CBG: No results for input(s): GLUCAP in the last 168 hours. Studies: Dg Chest Portable 1 View  Result Date: 10/02/2016 CLINICAL DATA:  Irregular heartbeat, history CHF, hypertension, atrial fibrillation, coronary artery disease EXAM: PORTABLE CHEST 1 VIEW COMPARISON:  Portable exam 1721 hours compared to 04/15/2015 FINDINGS: External pacing leads project over chest. Enlargement of cardiac silhouette with pulmonary vascular congestion. Atherosclerotic calcification aorta. Mild perihilar infiltrates likely representing pulmonary edema. Underlying emphysematous changes. No definite pleural effusion or pneumothorax. Bones demineralized with note of a LEFT shoulder prosthesis. IMPRESSION: Question mild CHF. Electronically Signed   By: Lavonia Dana M.D.   On: 10/02/2016 17:32    Scheduled Meds: . acetaminophen  650 mg Oral TID  . amiodarone  150 mg Intravenous Once  . atorvastatin  40 mg Oral QHS  . ethacrynic acid  75 mg Oral BID  . folic acid  2 mg Oral Daily  . levothyroxine  88 mcg Oral QAC breakfast  .  loratadine  10 mg Oral Daily  . methylPREDNISolone (SOLU-MEDROL) injection  60 mg Intravenous Q12H  . spironolactone  25 mg Oral Daily  . warfarin  2.5 mg Oral ONCE-1800  . Warfarin - Pharmacist Dosing Inpatient   Does not apply q1800   Continuous Infusions: . amiodarone    . amiodarone 30 mg/hr (10/03/16 1140)   PRN Meds: acetaminophen, ipratropium, ondansetron (ZOFRAN) IV, ondansetron (ZOFRAN) IV  Time spent: 30 minutes  Author: Berle Mull, MD [facility] Pager: [phone] 10/03/2016 1:17 PM  If 7PM-7AM, please contact night-coverage at [email or web address], password [facility]

## 2016-10-03 NOTE — Progress Notes (Signed)
  Patient discussed with Dr. Meda Coffee.   83 y/o female admitted with recurrent diastolic HF in setting of AF with RVR. She is allergic to loop diuretics (severe rash).  I spoke with pharmacy and we do not have IV ethacrynic acid in house. IV diuril also with sulf moiety. Only option is to treat with po ethacrynic acid and assess response.   The HF team will continue to follow.   Glori Bickers, MD  1:11 AM

## 2016-10-03 NOTE — ED Notes (Signed)
Attempted cardioversion at 200j. Pt in Afib. Rate appears to be controlled at this time. Will cont to monitor.

## 2016-10-03 NOTE — ED Notes (Signed)
Attempted cardioversion at 120j. Unsuccessful.

## 2016-10-03 NOTE — Progress Notes (Signed)
ANTICOAGULATION CONSULT NOTE  Pharmacy Consult for Warfarin Indication: atrial fibrillation  Assessment: 84 yof admitted with afib with RVR, CHF exacerbation. On warfarin PTA for afib - Pharmacy consulted to dose inpatient. Noted started on amiodarone IV in the ED - need to monitor INR closely. INR therapeutic 2.51 on admit. Hg 11.8, plt WNL. No bleed documented.  PTA warfarin dose: 2.'5mg'$  daily except 1.'25mg'$  on Fri per last anticoag clinic note on 09/11/16 (last dose 10/01/16 PTA)  Goal of Therapy:  INR 2-3 Monitor platelets by anticoagulation protocol: Yes   Plan:  -Warfarin 2.5 mg PO x 1 at 1800 -If amiodarone continues, will need to consider empiric dose reduction -Daily INR -Monitor CBC, s/sx bleeding, DDI with newly added amiodarone  Narda Bonds, PharmD, BCPS Clinical Pharmacist Phone: [phone]

## 2016-10-03 NOTE — Progress Notes (Signed)
Advanced Heart Failure Rounding Note  PCP: [name], MD Primary Cardiologist: Dr Sallyanne Kuster   Subjective:    Admitted 10/02/16 with acute diastolic CHF, Afib RVR.   She is allergic to furosemide, torsemide. Has nausea with ethacrynic acid.   Given po ethacrynic acid, weight stable. 400 ml out last night.   Objective:   Weight Range: 151 lb (68.5 kg) Body mass index is 25.13 kg/m.   Vital Signs:   Temp:  [97.4 F (36.3 C)-97.9 F (36.6 C)] 97.4 F (36.3 C) (05/10 0400) Pulse Rate:  [31-167] 108 (05/10 0400) Resp:  [18-42] 18 (05/10 0400) BP: (78-166)/(55-110) 108/60 (05/10 0400) SpO2:  [63 %-100 %] 96 % (05/10 0400) Weight:  [151 lb (68.5 kg)-151 lb 9.6 oz (68.8 kg)] 151 lb (68.5 kg) (05/10 0400) Last BM Date: 10/02/16  Weight change: Filed Weights   10/02/16 2258 10/03/16 0400  Weight: 151 lb 9.6 oz (68.8 kg) 151 lb (68.5 kg)    Intake/Output:   Intake/Output Summary (Last 24 hours) at 10/03/16 0751 Last data filed at 10/03/16 0417  Gross per 24 hour  Intake           246.35 ml  Output              400 ml  Net          -153.65 ml     Physical Exam: General: Elderly female, NAD.  HEENT: normal Neck: supple. JVP 7-8 cm.  . Carotids 2+ bilat; no bruits. No lymphadenopathy or thyromegaly appreciated. Cor: PMI nondisplaced. irregular rate & rhythm. No rubs, gallops. 2/6 AS murmur.  Lungs: clear in bilateral upper lobes, diminished in bases.  Abdomen: soft, nontender, nondistended. No hepatosplenomegaly. No bruits or masses. Good bowel sounds. Extremities: no cyanosis, clubbing, rash. 1+ pedal edema.  Neuro: alert & orientedx3, cranial nerves grossly intact. moves all 4 extremities w/o difficulty. Affect pleasant   Telemetry: Afib, rapid rates at times in the 130's with activity.   Labs: CBC  Recent Labs  10/02/16 1735 10/03/16 0331  WBC 17.0* 16.1*  NEUTROABS 14.6*  --   HGB 11.8* 11.9*  HCT 38.1 38.1  MCV 92.5 92.0  PLT 240 154   Basic  Metabolic Panel  Recent Labs  10/02/16 1735 10/03/16 0331  NA 138 136  K 4.1 4.4  CL 106 104  CO2 24 23  GLUCOSE 135* 170*  BUN 17 18  CREATININE 1.01* 0.98  CALCIUM 9.0 9.0   Liver Function Tests  Recent Labs  10/02/16 1735  AST 25  ALT 15  ALKPHOS 85  BILITOT 1.2  PROT 7.4  ALBUMIN 3.9   Cardiac Enzymes  Recent Labs  10/02/16 1735 10/02/16 2316  TROPONINI 0.04* 0.10*    BNP: BNP (last 3 results)  Recent Labs  10/02/16 1735  BNP 317.8*    Thyroid Function Tests  Recent Labs  10/02/16 2230  TSH 4.760*       Imaging/Studies:  Dg Chest Portable 1 View  Result Date: 10/02/2016 CLINICAL DATA:  Irregular heartbeat, history CHF, hypertension, atrial fibrillation, coronary artery disease EXAM: PORTABLE CHEST 1 VIEW COMPARISON:  Portable exam 1721 hours compared to 04/15/2015 FINDINGS: External pacing leads project over chest. Enlargement of cardiac silhouette with pulmonary vascular congestion. Atherosclerotic calcification aorta. Mild perihilar infiltrates likely representing pulmonary edema. Underlying emphysematous changes. No definite pleural effusion or pneumothorax. Bones demineralized with note of a LEFT shoulder prosthesis. IMPRESSION: Question mild CHF. Electronically Signed   By: Lavonia Dana  M.D.   On: 10/02/2016 17:32       Medications:     Scheduled Medications: . acetaminophen  650 mg Oral TID  . amiodarone  150 mg Intravenous Once  . atorvastatin  40 mg Oral QHS  . ethacrynic acid  75 mg Oral BID  . folic acid  2 mg Oral Daily  . levothyroxine  88 mcg Oral QAC breakfast  . loratadine  10 mg Oral Daily  . methylPREDNISolone (SOLU-MEDROL) injection  60 mg Intravenous Q12H  . warfarin  2.5 mg Oral ONCE-1800  . Warfarin - Pharmacist Dosing Inpatient   Does not apply q1800     Infusions: . amiodarone    . amiodarone 30 mg/hr (10/02/16 2350)     PRN Medications:  acetaminophen, ipratropium, ondansetron (ZOFRAN) IV, ondansetron  (ZOFRAN) IV   Assessment/Plan   1. Acute on chronic diastolic CHF:  - Exacerbated by Afib.  - weight stable, not diuresing well with po ethacrynic acid - Add back spiro '25mg'$  daily 2. Persistent atrial fibrillation: - Rates remain rapid with activity - Continue amio gtt.  - Has had 3 failed DCCV in the past.  - This patients CHA2DS2-VASc Score and unadjusted Ischemic Stroke Rate (% per year) is equal to 4.8 % stroke rate/year from a score of 4 Above score calculated as 1 point each if present [CHF, HTN, DM, Vascular=MI/PAD/Aortic Plaque, Age if 65-74, or Female], 2 points each if present [Age > 75, or Stroke/TIA/TE] - Continue warfarin for anticoagulation  3. Aortic stenosis:  - Echo in 2016 with moderate AS, peak velocity 1.05.  - Repeat echo pending.  Length of Stay: [place], NP  10/03/2016, 7:51 AM  Advanced Heart Failure Team Pager [phone] (M-F; 7a - 4p)  Please contact [facility] for night-coverage after hours (4p -7a ) and weekends on [email or web address]  Patient seen and examined with Jettie Booze, NP. We discussed all aspects of the encounter. I agree with the assessment and plan as stated above.   Echo reviewed personally LVEF 65% RV ok. Moderate AS. Remains in AF and is not diuresing well at all despite apparent volume overload. Allergic to loop diuretics. Will need to increase po Ethacrynic acid. Spoke with PharmD and no IV ethacrynic acid available at [facility] currently   Will continue amio for AF but she states this had to be stopped previously. Unclear why. I will discuss with Dr. Loletha Grayer. Continue warfarin   She was clear today that if she is not getting better would not want to keep living like this.   Glori Bickers, MD  9:29 PM

## 2016-10-03 NOTE — ED Notes (Signed)
Attempted cardioversion at 150j. Unsuccessful.

## 2016-10-03 NOTE — ED Notes (Signed)
Attempted Cardioversion at 200j.

## 2016-10-03 NOTE — Progress Notes (Signed)
  Echocardiogram 2D Echocardiogram has been performed.  [name] 10/03/2016, 10:50 AM

## 2016-10-03 NOTE — Progress Notes (Signed)
Pt had an episode nausea with emesis shortly after receiving her Ethacrynic Acid. Pt received Zofran prior to medication being given. After episode of emesis, pt was bathed and has been visiting with family this afternoon.   Andy (HF PA), who was on the floor made aware. This afternoon pt stated she is feeling nausuated again. Dr. Posey Pronto made aware. New order received for prn ativan. Will cont to monitor pt.

## 2016-10-04 ENCOUNTER — Encounter (HOSPITAL_COMMUNITY): Payer: Self-pay

## 2016-10-04 ENCOUNTER — Inpatient Hospital Stay (HOSPITAL_COMMUNITY): Payer: Medicare Other

## 2016-10-04 DIAGNOSIS — I482 Chronic atrial fibrillation: Secondary | ICD-10-CM

## 2016-10-04 LAB — BASIC METABOLIC PANEL
Anion gap: 11 (ref 5–15)
BUN: 31 mg/dL — ABNORMAL HIGH (ref 6–20)
CHLORIDE: 104 mmol/L (ref 101–111)
CO2: 20 mmol/L — AB (ref 22–32)
Calcium: 8.7 mg/dL — ABNORMAL LOW (ref 8.9–10.3)
Creatinine, Ser: 1.2 mg/dL — ABNORMAL HIGH (ref 0.44–1.00)
GFR calc non Af Amer: 39 mL/min — ABNORMAL LOW (ref >60)
GFR, EST AFRICAN AMERICAN: 45 mL/min — AB (ref >60)
Glucose, Bld: 144 mg/dL — ABNORMAL HIGH (ref 65–99)
POTASSIUM: 4 mmol/L (ref 3.5–5.1)
SODIUM: 135 mmol/L (ref 135–145)

## 2016-10-04 LAB — MAGNESIUM: MAGNESIUM: 2 mg/dL (ref 1.7–2.4)

## 2016-10-04 LAB — PROTIME-INR
INR: 2.63
Prothrombin Time: 28.6 seconds — ABNORMAL HIGH (ref 11.4–15.2)

## 2016-10-04 MED ORDER — MAGNESIUM HYDROXIDE 400 MG/5ML PO SUSP
15.0000 mL | Freq: Every day | ORAL | Status: DC
  Administered 2016-10-04 – 2016-10-08 (×5): 15 mL via ORAL
  Filled 2016-10-04 (×5): qty 30

## 2016-10-04 MED ORDER — POLYETHYLENE GLYCOL 3350 17 G PO PACK
17.0000 g | PACK | Freq: Every day | ORAL | Status: DC
  Administered 2016-10-04 – 2016-10-05 (×2): 17 g via ORAL
  Filled 2016-10-04 (×2): qty 1

## 2016-10-04 MED ORDER — METOPROLOL TARTRATE 5 MG/5ML IV SOLN
5.0000 mg | Freq: Once | INTRAVENOUS | Status: AC
  Administered 2016-10-04: 5 mg via INTRAVENOUS
  Filled 2016-10-04: qty 5

## 2016-10-04 MED ORDER — SENNOSIDES-DOCUSATE SODIUM 8.6-50 MG PO TABS
1.0000 | ORAL_TABLET | Freq: Two times a day (BID) | ORAL | Status: DC
  Administered 2016-10-04 – 2016-10-05 (×2): 1 via ORAL
  Filled 2016-10-04 (×2): qty 1

## 2016-10-04 MED ORDER — WARFARIN SODIUM 2.5 MG PO TABS
1.2500 mg | ORAL_TABLET | Freq: Once | ORAL | Status: AC
Start: 2016-10-04 — End: 2016-10-04
  Administered 2016-10-04: 1.25 mg via ORAL
  Filled 2016-10-04: qty 0.5

## 2016-10-04 NOTE — Progress Notes (Signed)
ANTICOAGULATION CONSULT NOTE - Follow Up Consult  Pharmacy Consult for Coumadin Indication: atrial fibrillation  Allergies  Allergen Reactions  . [name] [Ethacrynic Acid] Nausea And Vomiting  . Tape Other (See Comments)    Tears skin off.  Please use "paper" tape only.  . Furosemide Rash  . Sulfa Antibiotics Itching and Rash  . [name] Rash    Per Dr Sallyanne Kuster, had a skin reaction with thiazides prior to use of Lasix  . Torsemide Rash    Patient Measurements: Height: '5\' 5"'$  (165.1 cm) Weight: 145 lb 14.4 oz (66.2 kg) IBW/kg (Calculated) : 57  Vital Signs: Temp: 97.4 F (36.3 C) (05/11 0741) Temp Source: Oral (05/11 0741) BP: 122/70 (05/11 0741) Pulse Rate: 97 (05/11 0741)  Labs:  Recent Labs  10/02/16 1735 10/02/16 2230 10/02/16 2316 10/03/16 0331 10/04/16 0422  HGB 11.8*  --   --  11.9*  --   HCT 38.1  --   --  38.1  --   PLT 240  --   --  218  --   LABPROT  --  27.5*  --  25.9* 28.6*  INR  --  2.51  --  2.32 2.63  CREATININE 1.01*  --   --  0.98 1.20*  TROPONINI 0.04*  --  0.10*  --   --     Estimated Creatinine Clearance: 28 mL/min (A) (by C-G formula based on SCr of 1.2 mg/dL (H)).  Assessment: 89yof continues on coumadin for afib. INR therapeutic at 2.63. She is also on amiodarone which is new for her so will need to watch for drug interaction closely.  Home dose: 2.'5mg'$  daily except 1.'25mg'$  on Fri per last anticoag clinic note on 4/18 - last taken 5/8  Goal of Therapy:  INR 2-3 Monitor platelets by anticoagulation protocol: Yes   Plan:  1) Coumadin 1.'25mg'$  tonight 2) Daily INR  Deboraha Sprang 10/04/2016,10:29 AM

## 2016-10-04 NOTE — Progress Notes (Signed)
Advanced Heart Failure Rounding Note  PCP: [name], MD Primary Cardiologist: Dr Sallyanne Kuster   Subjective:    Admitted 10/02/16 with acute diastolic CHF, Afib RVR.   She is allergic to furosemide, torsemide. Has nausea with ethacrynic acid.   Weight down 6 pounds, ethacrynic acid increased to '100mg'$  BID. Feels better today. Remains in Afib, rates controlled in the 90-100's.   Objective:   Weight Range: 145 lb 14.4 oz (66.2 kg) Body mass index is 24.28 kg/m.   Vital Signs:   Temp:  [97.4 F (36.3 C)-98 F (36.7 C)] 97.4 F (36.3 C) (05/11 0741) Pulse Rate:  [75-102] 97 (05/11 0741) Resp:  [17-19] 19 (05/11 0741) BP: (106-126)/(60-80) 122/70 (05/11 0741) SpO2:  [95 %-100 %] 100 % (05/11 0741) Weight:  [145 lb 14.4 oz (66.2 kg)] 145 lb 14.4 oz (66.2 kg) (05/11 0741) Last BM Date: 10/03/16  Weight change: Filed Weights   10/02/16 2258 10/03/16 0400 10/04/16 0741  Weight: 151 lb 9.6 oz (68.8 kg) 151 lb (68.5 kg) 145 lb 14.4 oz (66.2 kg)    Intake/Output:   Intake/Output Summary (Last 24 hours) at 10/04/16 0920 Last data filed at 10/04/16 0900  Gross per 24 hour  Intake              480 ml  Output              325 ml  Net              155 ml     Physical Exam: General: Elderly female, NAD.  HEENT: normal Neck: supple. JVD to jaw. Carotids 2+ bilat; no bruits. No lymphadenopathy or thyromegaly appreciated. Cor: PMI nondisplaced. irregular rate & rhythm. No rubs, gallops. 2/6 AS murmur.  Lungs: clear in bilateral upper lobes, diminished in bases.  Abdomen: soft, nontender, nondistended. No hepatosplenomegaly. No bruits or masses. Good bowel sounds. Extremities: no cyanosis, clubbing, rash. 1+ pedal edema.  Neuro: alert & orientedx3, cranial nerves grossly intact. moves all 4 extremities w/o difficulty. Affect pleasant   Telemetry: Afib, rates in the 90's - 100's.   Labs: CBC  Recent Labs  10/02/16 1735 10/03/16 0331  WBC 17.0* 16.1*  NEUTROABS 14.6*   --   HGB 11.8* 11.9*  HCT 38.1 38.1  MCV 92.5 92.0  PLT 240 161   Basic Metabolic Panel  Recent Labs  10/03/16 0331 10/04/16 0422  NA 136 135  K 4.4 4.0  CL 104 104  CO2 23 20*  GLUCOSE 170* 144*  BUN 18 31*  CREATININE 0.98 1.20*  CALCIUM 9.0 8.7*  MG  --  2.0   Liver Function Tests  Recent Labs  10/02/16 1735  AST 25  ALT 15  ALKPHOS 85  BILITOT 1.2  PROT 7.4  ALBUMIN 3.9   Cardiac Enzymes  Recent Labs  10/02/16 1735 10/02/16 2316  TROPONINI 0.04* 0.10*    BNP: BNP (last 3 results)  Recent Labs  10/02/16 1735  BNP 317.8*    Thyroid Function Tests  Recent Labs  10/02/16 2230  TSH 4.760*     Transthoracic Echocardiography Study Conclusions  - Left ventricle: The cavity size was normal. Wall thickness was   increased in a pattern of mild LVH. Systolic function was   vigorous. The estimated ejection fraction was in the range of 65%   to 70%. Wall motion was normal; there were no regional wall   motion abnormalities. - Aortic valve: Valve mobility was restricted. There was moderate  stenosis. Peak velocity (S): 321 cm/s. - Mitral valve: Calcified annulus. Mildly thickened leaflets .   There was mild regurgitation. - Right atrium: The atrium was mildly dilated. - Tricuspid valve: There was moderate regurgitation. - Pulmonary arteries: Systolic pressure was moderately increased.   PA peak pressure: 60 mm Hg (S). - Pericardium, extracardiac: A trivial pericardial effusion was   identified posterior to the heart.  Impressions:  - Compared to the prior study, there has been no significant   interval change.     Medications:     Scheduled Medications: . acetaminophen  650 mg Oral TID  . amiodarone  150 mg Intravenous Once  . atorvastatin  40 mg Oral QHS  . ethacrynic acid  100 mg Oral BID  . folic acid  2 mg Oral Daily  . levothyroxine  88 mcg Oral QAC breakfast  . loratadine  10 mg Oral Daily  . spironolactone  25 mg Oral  Daily  . Warfarin - Pharmacist Dosing Inpatient   Does not apply q1800    Infusions: . amiodarone    . amiodarone 30 mg/hr (10/04/16 0621)    PRN Medications: acetaminophen, ipratropium, LORazepam, ondansetron (ZOFRAN) IV, sodium chloride   Assessment/Plan   1. Acute on chronic diastolic CHF:  - Exacerbated by Afib.  - weight down today, 6 pounds.  - Continue ethacrynic acid 100 mg BID, IV formulation not available.  - Continue spiro '25mg'$  daily 2. Persistent atrial fibrillation: - Rates remain rapid with activity - Continue amio gtt.  - Has had 3 failed DCCV in the past.  - This patients CHA2DS2-VASc Score and unadjusted Ischemic Stroke Rate (% per year) is equal to 4.8 % stroke rate/year from a score of 4 Above score calculated as 1 point each if present [CHF, HTN, DM, Vascular=MI/PAD/Aortic Plaque, Age if 65-74, or Female], 2 points each if present [Age > 75, or Stroke/TIA/TE] - Continue warfarin for anticoagulation  - no change to current plan.  3. Aortic stenosis:  - Echo in 2016 with moderate AS, peak velocity 1.05.  - Repeat echo with peak velocity 321.   Length of Stay: [place], NP  10/04/2016, 9:20 AM  Advanced Heart Failure Team Pager [phone] (M-F; 7a - 4p)  Please contact [facility] for night-coverage after hours (4p -7a ) and weekends on [email or web address]  Patient seen and examined with Jettie Booze, NP. We discussed all aspects of the encounter. I agree with the assessment and plan as stated above.   She feels poorly today. +nausea and ab pain. Belly exam benign. May be amiodarone.  Volume status improving slowly. Will continue ethacrynic acid at least one more day.   Remains in AF. Rate slightly elevated. I have reviewed Dr. Johnnette Gourd notes and she has has failed rhythm control strategy several times even with amio support.Will stop amio. Can use CCB or b-blocker as needed. Continue warfarin. Discussed with PharmD.   Echo image reviewed personally  again today. Aortic valve is heavily calcified with limited mobility but gradient only in mild to moderate range (mean 19-21).  Will see if she improved with stopping amio. If continue to deteriorate may need to consider [place] meeting.   D/w Dr. Posey Pronto at bedside.   Glori Bickers, MD  5:38 PM

## 2016-10-04 NOTE — Progress Notes (Signed)
[facility] Progress Note  Patient: [name] WUJ:[id number]   PCP: [name], MD DOB: Mar 26, 1924   DOA: 10/02/2016   DOS: 10/04/2016   Date of Service: the patient was seen and examined on 10/04/2016  Subjective: Continues to have nausea as well as abdominal pain.  Brief hospital course: Pt. with PMH of Chronic dCHF, chronic A. fib on anticoagulation, mod AS, HTN, CAD s/p PCI 2010, hypothyroidism; admitted on 10/02/2016, presented with complaint of shortnes of breath, was found to have A fib with RVR and acute on chronic CHF. Currently further plan is continue rate control and diuresis.  Assessment and Plan: Paroxysmal atrialfibrillation on chronic anticoagulation Acute on chronic diastolic dysfunction Patient presented in A. fib with RVR with heart rates into the 160s. Patient was electrically cardioverted at least 4 times all in the ED, but remained in persistent A. fib. Cardiology consulted and recommended placement on patient on amiodarone drip.  Coumadin and per pharmacy Amiodarone drip stopped now due to gastroenterology symptoms. Echocardiogram suggest no new changes and preserved EF Appreciate cardiology consultative services. Continue ethacrynic acid.  Elevated cardiac troponin- demand ischemia  Could be secondary to supply demand with patient having rapid atrial fibrillation. Subtle ST elevations on coumadin therapeutic INR,   Hypotension: Acute. Initial blood pressures noted to be as low as 78/65. Blood pressures improved after able to get heart rates under better control. - Continue to monitor  Diastolic CHF exacerbation with H/O moderate Aortic stenosis:Acute. Last EF noted to be 55-60% in9/2016.  - strict ins and outs and daily weights - Echocardiogram unchanged - [name] acid 75 mg   Nausea and vomiting Constipation  - Zofran prn Add bowel regimen  CAD s/p PCI: Patient underwent placement of bare-metal stent to RCA in 2010.   Anemia:  Hemoglobin stable - Continue to monitor  Hyperlipidemia - Continue atorvastatin  Hypothyroidism - mildly elevated TSH, check free t4.  - Continue Levothyroxine  Diet: cardiac diet DVT Prophylaxis: on therapeutic anticoagulation.  Advance goals of care discussion: full code  Family Communication: no family was present at bedside, at the time of interview.   Disposition:  Discharge to home vs SNF.  Consultants: cardiology Procedures: Echocardiogram  Antibiotics: Anti-infectives    None       Objective: Physical Exam: Vitals:   10/03/16 2100 10/03/16 2300 10/04/16 0741 10/04/16 1312  BP: 117/60 126/67 122/70 119/66  Pulse: 75 81 97 97  Resp: '17 17 19 '$ (!) 29  Temp: 97.7 F (36.5 C) 97.9 F (36.6 C) 97.4 F (36.3 C) 97.9 F (36.6 C)  TempSrc: Oral Oral Oral Oral  SpO2: 95% 95% 100% 100%  Weight:   66.2 kg (145 lb 14.4 oz)   Height:        Intake/Output Summary (Last 24 hours) at 10/04/16 1818 Last data filed at 10/04/16 0900  Gross per 24 hour  Intake              240 ml  Output              325 ml  Net              -85 ml   Filed Weights   10/02/16 2258 10/03/16 0400 10/04/16 0741  Weight: 68.8 kg (151 lb 9.6 oz) 68.5 kg (151 lb) 66.2 kg (145 lb 14.4 oz)   General: Alert, Awake and Oriented to Time, Place and Person. Appear in moderate distress, affect appropriate Eyes: PERRL, Conjunctiva normal ENT: Oral Mucosa clear  moist. Neck: positive JVD, no Abnormal Mass Or lumps Cardiovascular: S1 and S2 Present, no Murmur, Respiratory: Bilateral Air entry equal and Decreased, no use of accessory muscle, bilateral Crackles, no wheezes Abdomen: Bowel Sound present, Soft and no tenderness Skin: no redness, no Rash, no induration Extremities: bilateral Pedal edema, no calf tenderness Neurologic: Grossly no focal neuro deficit. Bilaterally Equal motor strength  Data Reviewed: CBC:  Recent Labs Lab 10/02/16 1735 10/03/16 0331  WBC 17.0* 16.1*  NEUTROABS  14.6*  --   HGB 11.8* 11.9*  HCT 38.1 38.1  MCV 92.5 92.0  PLT 240 001   Basic Metabolic Panel:  Recent Labs Lab 10/02/16 1735 10/03/16 0331 10/04/16 0422  NA 138 136 135  K 4.1 4.4 4.0  CL 106 104 104  CO2 24 23 20*  GLUCOSE 135* 170* 144*  BUN 17 18 31*  CREATININE 1.01* 0.98 1.20*  CALCIUM 9.0 9.0 8.7*  MG  --   --  2.0    Liver Function Tests:  Recent Labs Lab 10/02/16 1735  AST 25  ALT 15  ALKPHOS 85  BILITOT 1.2  PROT 7.4  ALBUMIN 3.9   No results for input(s): LIPASE, AMYLASE in the last 168 hours. No results for input(s): AMMONIA in the last 168 hours. Coagulation Profile:  Recent Labs Lab 10/02/16 2230 10/03/16 0331 10/04/16 0422  INR 2.51 2.32 2.63   Cardiac Enzymes:  Recent Labs Lab 10/02/16 1735 10/02/16 2316  TROPONINI 0.04* 0.10*   BNP (last 3 results) No results for input(s): PROBNP in the last 8760 hours. CBG: No results for input(s): GLUCAP in the last 168 hours. Studies: Dg Abd Portable 1v  Result Date: 10/04/2016 CLINICAL DATA:  Mid abdominal pain since yesterday. EXAM: PORTABLE ABDOMEN - 1 VIEW COMPARISON:  Abdominal CT 07/06/2015 FINDINGS: No evidence of bowel dilatation to suggest obstruction. Air-filled bowel in the pelvis and left abdomen felt to tortuous sigmoid. Moderate stool in the cecum and ascending colon. Ingested material in the stomach versus stool in the transverse colon in the left upper quadrant. No evidence of free air on single view. Ovoid calcification in the right lower quadrant is in the pericolonic fat on prior CT. Calcified gallstone on prior CT is tentatively identified in the right upper quadrant. The bones appear under mineralized. Right hip prosthesis partially included. IMPRESSION: No evidence of bowel obstruction on portable supine view. Tortuous sigmoid colon with moderate stool in the ascending colon. Ingested material versus stool in the transverse colon. Known cholelithiasis tentatively identified in  the right upper quadrant. Electronically Signed   By: Jeb Levering M.D.   On: 10/04/2016 16:03    Scheduled Meds: . acetaminophen  650 mg Oral TID  . atorvastatin  40 mg Oral QHS  . ethacrynic acid  100 mg Oral BID  . folic acid  2 mg Oral Daily  . levothyroxine  88 mcg Oral QAC breakfast  . loratadine  10 mg Oral Daily  . spironolactone  25 mg Oral Daily  . warfarin  1.25 mg Oral ONCE-1800  . Warfarin - Pharmacist Dosing Inpatient   Does not apply q1800   Continuous Infusions:  PRN Meds: acetaminophen, ipratropium, LORazepam, ondansetron (ZOFRAN) IV, sodium chloride  Time spent: 30 minutes  Author: Berle Mull, MD [facility] Pager: [phone] 10/04/2016 6:18 PM  If 7PM-7AM, please contact night-coverage at [email or web address], password [facility]

## 2016-10-04 NOTE — Progress Notes (Signed)
PT Cancellation Note  Patient Details Name: [name] MRN: [id number] DOB: 04-22-1924   Cancelled Treatment:    Reason Eval/Treat Not Completed: Fatigue/lethargy limiting ability to participate. Just back into bed after going  To [facility] with assistance.  Check back tomorrow,.   Claretha Cooper 10/04/2016, 4:28 PM Tresa Endo PT [phone]

## 2016-10-04 NOTE — Progress Notes (Signed)
Pt A-Fib RVR after getting up to commode. HR 140-170. Pt also having nausea at this time. Notified on call provider. Metoprolol and Zofran administered per order. Controlled rate after medication and rest. Continue to monitor.

## 2016-10-05 ENCOUNTER — Inpatient Hospital Stay (HOSPITAL_COMMUNITY): Payer: Medicare Other

## 2016-10-05 DIAGNOSIS — R140 Abdominal distension (gaseous): Secondary | ICD-10-CM

## 2016-10-05 LAB — CBC WITH DIFFERENTIAL/PLATELET
Basophils Absolute: 0 10*3/uL (ref 0.0–0.1)
Basophils Relative: 0 %
EOS PCT: 0 %
Eosinophils Absolute: 0 10*3/uL (ref 0.0–0.7)
HEMATOCRIT: 41.2 % (ref 36.0–46.0)
Hemoglobin: 13.4 g/dL (ref 12.0–15.0)
LYMPHS ABS: 1.5 10*3/uL (ref 0.7–4.0)
Lymphocytes Relative: 8 %
MCH: 29.3 pg (ref 26.0–34.0)
MCHC: 32.5 g/dL (ref 30.0–36.0)
MCV: 90.2 fL (ref 78.0–100.0)
MONOS PCT: 4 %
Monocytes Absolute: 0.8 10*3/uL (ref 0.1–1.0)
Neutro Abs: 16.8 10*3/uL — ABNORMAL HIGH (ref 1.7–7.7)
Neutrophils Relative %: 88 %
Platelets: 274 10*3/uL (ref 150–400)
RBC: 4.57 MIL/uL (ref 3.87–5.11)
RDW: 15.1 % (ref 11.5–15.5)
WBC: 19.1 10*3/uL — AB (ref 4.0–10.5)

## 2016-10-05 LAB — URINALYSIS, ROUTINE W REFLEX MICROSCOPIC
Bilirubin Urine: NEGATIVE
Glucose, UA: NEGATIVE mg/dL
Ketones, ur: NEGATIVE mg/dL
Leukocytes, UA: NEGATIVE
Nitrite: NEGATIVE
Protein, ur: NEGATIVE mg/dL
SPECIFIC GRAVITY, URINE: 1.013 (ref 1.005–1.030)
Squamous Epithelial / HPF: NONE SEEN
WBC, UA: NONE SEEN WBC/hpf (ref 0–5)
pH: 5 (ref 5.0–8.0)

## 2016-10-05 LAB — HEPATIC FUNCTION PANEL
ALBUMIN: 3.9 g/dL (ref 3.5–5.0)
ALK PHOS: 66 U/L (ref 38–126)
ALT: 17 U/L (ref 14–54)
AST: 22 U/L (ref 15–41)
BILIRUBIN INDIRECT: 0.4 mg/dL (ref 0.3–0.9)
Bilirubin, Direct: 0.2 mg/dL (ref 0.1–0.5)
TOTAL PROTEIN: 7.7 g/dL (ref 6.5–8.1)
Total Bilirubin: 0.6 mg/dL (ref 0.3–1.2)

## 2016-10-05 LAB — BASIC METABOLIC PANEL
ANION GAP: 13 (ref 5–15)
BUN: 43 mg/dL — AB (ref 6–20)
CALCIUM: 9 mg/dL (ref 8.9–10.3)
CO2: 18 mmol/L — ABNORMAL LOW (ref 22–32)
CREATININE: 1.43 mg/dL — AB (ref 0.44–1.00)
Chloride: 106 mmol/L (ref 101–111)
GFR calc Af Amer: 36 mL/min — ABNORMAL LOW (ref >60)
GFR, EST NON AFRICAN AMERICAN: 31 mL/min — AB (ref >60)
GLUCOSE: 129 mg/dL — AB (ref 65–99)
Potassium: 3.2 mmol/L — ABNORMAL LOW (ref 3.5–5.1)
Sodium: 137 mmol/L (ref 135–145)

## 2016-10-05 LAB — PROTIME-INR
INR: 3.2
PROTHROMBIN TIME: 33.5 s — AB (ref 11.4–15.2)

## 2016-10-05 LAB — PROCALCITONIN: PROCALCITONIN: 0.39 ng/mL

## 2016-10-05 LAB — LACTIC ACID, PLASMA: Lactic Acid, Venous: 1.6 mmol/L (ref 0.5–1.9)

## 2016-10-05 LAB — MAGNESIUM: Magnesium: 2.4 mg/dL (ref 1.7–2.4)

## 2016-10-05 MED ORDER — POTASSIUM CHLORIDE CRYS ER 20 MEQ PO TBCR
40.0000 meq | EXTENDED_RELEASE_TABLET | Freq: Once | ORAL | Status: AC
  Administered 2016-10-05: 40 meq via ORAL
  Filled 2016-10-05: qty 2

## 2016-10-05 MED ORDER — POLYETHYLENE GLYCOL 3350 17 G PO PACK
17.0000 g | PACK | Freq: Two times a day (BID) | ORAL | Status: DC
  Administered 2016-10-05 – 2016-10-08 (×6): 17 g via ORAL
  Filled 2016-10-05 (×6): qty 1

## 2016-10-05 MED ORDER — METOPROLOL TARTRATE 25 MG PO TABS
25.0000 mg | ORAL_TABLET | Freq: Two times a day (BID) | ORAL | Status: DC
  Administered 2016-10-05 – 2016-10-08 (×7): 25 mg via ORAL
  Filled 2016-10-05 (×7): qty 1

## 2016-10-05 MED ORDER — DOCUSATE SODIUM 100 MG PO CAPS
100.0000 mg | ORAL_CAPSULE | Freq: Two times a day (BID) | ORAL | Status: DC
  Administered 2016-10-05 – 2016-10-08 (×6): 100 mg via ORAL
  Filled 2016-10-05 (×6): qty 1

## 2016-10-05 NOTE — Progress Notes (Signed)
Advanced Heart Failure Rounding Note  PCP: [name], MD Primary Cardiologist: Dr Sallyanne Kuster   Subjective:    Admitted 10/02/16 with acute diastolic CHF, Afib RVR.   She is allergic to furosemide, torsemide. Has nausea with ethacrynic acid.   Amiodarone stopped yesterday due to ongoing nausea. AF rate up overnight. Back down to 90s today. Still with nausea.   Diuresing well on ethacrynic acid. Weight down another 4 pounds (10 pounds total). Creatinine up 1.2-> 1.4  WBC climbing. Now 19k. No fevers or chills. Belly very tight. Denies diarrhea.   Objective:   Weight Range: 64 kg (141 lb 3.2 oz) Body mass index is 23.5 kg/m.   Vital Signs:   Temp:  [97.8 F (36.6 C)-98 F (36.7 C)] 98 F (36.7 C) (05/12 0808) Pulse Rate:  [95-112] 95 (05/12 0808) Resp:  [16-29] 18 (05/12 0808) BP: (112-159)/(66-89) 134/76 (05/12 0808) SpO2:  [95 %-100 %] 97 % (05/12 0808) Weight:  [64 kg (141 lb 3.2 oz)] 64 kg (141 lb 3.2 oz) (05/12 0300) Last BM Date: 10/03/16  Weight change: Filed Weights   10/03/16 0400 10/04/16 0741 10/05/16 0300  Weight: 68.5 kg (151 lb) 66.2 kg (145 lb 14.4 oz) 64 kg (141 lb 3.2 oz)    Intake/Output:   Intake/Output Summary (Last 24 hours) at 10/05/16 1113 Last data filed at 10/05/16 1112  Gross per 24 hour  Intake              180 ml  Output               50 ml  Net              130 ml     Physical Exam: General: Elderly female, lying in bed uncomfortable HEENT: normal Neck: supple. JVD 6-7 Carotids 2+ bilat; no bruits. No lymphadenopathy or thyromegaly appreciated. Cor: PMI nondisplaced. Tachy irregular 2/6 AS Lungs: clear   Abdomen: soft, mild diffuse tenderness , +++ distended. No hepatosplenomegaly. No bruits or masses. Hypoactive bowel sounds. Extremities: no cyanosis, clubbing, rash. No edema Neuro: alert & orientedx3, cranial nerves grossly intact. moves all 4 extremities w/o difficulty. Affect pleasant   Telemetry: Afib, rates in the  90's - 140's.  Personally reviewed   Labs: CBC  Recent Labs  10/02/16 1735 10/03/16 0331 10/05/16 1033  WBC 17.0* 16.1* 19.1*  NEUTROABS 14.6*  --  PENDING  HGB 11.8* 11.9* 13.4  HCT 38.1 38.1 41.2  MCV 92.5 92.0 90.2  PLT 240 218 102   Basic Metabolic Panel  Recent Labs  10/04/16 0422 10/05/16 0437  NA 135 137  K 4.0 3.2*  CL 104 106  CO2 20* 18*  GLUCOSE 144* 129*  BUN 31* 43*  CREATININE 1.20* 1.43*  CALCIUM 8.7* 9.0  MG 2.0 2.4   Liver Function Tests  Recent Labs  10/02/16 1735  AST 25  ALT 15  ALKPHOS 85  BILITOT 1.2  PROT 7.4  ALBUMIN 3.9   Cardiac Enzymes  Recent Labs  10/02/16 1735 10/02/16 2316  TROPONINI 0.04* 0.10*    BNP: BNP (last 3 results)  Recent Labs  10/02/16 1735  BNP 317.8*    Thyroid Function Tests  Recent Labs  10/02/16 2230  TSH 4.760*     Transthoracic Echocardiography Study Conclusions  - Left ventricle: The cavity size was normal. Wall thickness was   increased in a pattern of mild LVH. Systolic function was   vigorous. The estimated ejection fraction was in  the range of 65%   to 70%. Wall motion was normal; there were no regional wall   motion abnormalities. - Aortic valve: Valve mobility was restricted. There was moderate   stenosis. Peak velocity (S): 321 cm/s. - Mitral valve: Calcified annulus. Mildly thickened leaflets .   There was mild regurgitation. - Right atrium: The atrium was mildly dilated. - Tricuspid valve: There was moderate regurgitation. - Pulmonary arteries: Systolic pressure was moderately increased.   PA peak pressure: 60 mm Hg (S). - Pericardium, extracardiac: A trivial pericardial effusion was   identified posterior to the heart.  Impressions:  - Compared to the prior study, there has been no significant   interval change.     Medications:     Scheduled Medications: . acetaminophen  650 mg Oral TID  . atorvastatin  40 mg Oral QHS  . ethacrynic acid  100 mg Oral  BID  . folic acid  2 mg Oral Daily  . levothyroxine  88 mcg Oral QAC breakfast  . loratadine  10 mg Oral Daily  . magnesium hydroxide  15 mL Oral Daily  . polyethylene glycol  17 g Oral Daily  . senna-docusate  1 tablet Oral BID  . spironolactone  25 mg Oral Daily  . Warfarin - Pharmacist Dosing Inpatient   Does not apply q1800    Infusions:   PRN Medications: acetaminophen, ipratropium, LORazepam, ondansetron (ZOFRAN) IV, sodium chloride   Assessment/Plan   1. Acute on chronic diastolic CHF:  - Exacerbated by Afib - weight down today now at baseline. Creatinine up/ - stop diuretics for now  2. Persistent atrial fibrillation: - Amiodarone stopped yesterday due to ongoing nausea. (Failed amio in past as it was ineffective at maintaining NSR)  - AF rates remain rapid with activity - will add lopressor 25 bid. Watch bp closely.  - Continue warfarin - Has had 3 failed DCCV in the past.  - This patients CHA2DS2-VASc Score and unadjusted Ischemic Stroke Rate (% per year) is equal to 4.8 % stroke rate/year from a score of 4  - no change to current plan.  3. Aortic stenosis:  - Echo this admit. EF 65% moderate AS (mean gradient 21)  4. Leukocytosis and ab distension  - Getting worse. Unclear source. Possible enteritis or SBO? Bicarb down but lactate ok - Will checkUA and [place].  - I discussed with Dr. Posey Pronto. Will get non-contrast CT. May need NG decompression  - I worry about C.dff but no diarrhea   5. AKI - perhaps pre-renal. Stop diuretics for now 6. Hypokalemia - supp gently  Length of Stay: 3   Glori Bickers, MD  10/05/2016, 11:13 AM  Advanced Heart Failure Team Pager [phone] (M-F; 7a - 4p)  Please contact [facility] for night-coverage after hours (4p -7a ) and weekends on [email or web address]

## 2016-10-05 NOTE — Progress Notes (Signed)
[facility] Progress Note  Patient: [name] EYC:[id number]   PCP: [name], MD DOB: 1924-04-14   DOA: 10/02/2016   DOS: 10/05/2016   Date of Service: the patient was seen and examined on 10/05/2016  Subjective: Has abdominal pain as well as nausea no vomiting. Did have a bowel movement this morning as well. Her breathing is better  Brief hospital course: Pt. with PMH of Chronic dCHF, chronic A. fib on anticoagulation, mod AS, HTN, CAD s/p PCI 2010, hypothyroidism; admitted on 10/02/2016, presented with complaint of shortnes of breath, was found to have A fib with RVR and acute on chronic CHF. Currently further plan is continue rate control and diuresis.  Assessment and Plan: Paroxysmal atrialfibrillation on chronic anticoagulation Acute on chronic diastolic dysfunction Patient presented in A. fib with RVR with heart rates into the 160s. Patient was electrically cardioverted at least 4 times all in the ED, but remained in persistent A. fib. Cardiology consulted and recommended placement on patient on amiodarone drip.  Coumadin and per pharmacy Amiodarone drip stopped now due to GI symptoms. Echocardiogram suggest no new changes and preserved EF Appreciate cardiology consultative services. Currently ethacrynic acid on hold.  Elevated cardiac troponin- demand ischemia  secondary to supply demand with patient having rapid atrial fibrillation. Subtle ST elevations on coumadin therapeutic INR,   Colonic ileus without any bowel obstruction. Nausea and vomiting I would change her to clear liquid diet, continue when necessary Zofran, continue MiraLAX. Change Senokot and Colace. Ambulate every 4 hour, out of bed 3 times a day. Replace potassium. Will need NG tube decompression if started vomiting. May require Reglan but currently holding due to abdominal pain.  CAD s/p PCI: Patient underwent placement of bare-metal stent to RCA in 2010.   Anemia: Hemoglobin stable -  Continue to monitor  Hyperlipidemia - Continue atorvastatin  Hypothyroidism - mildly elevated TSH, check free t4.  - Continue Levothyroxine  Diet: clear liquid diet DVT Prophylaxis: Heparin  Advance goals of care discussion: full code  Family Communication: no family was present at bedside, at the time of interview.  Disposition:  Discharge to SNF.  Consultants: cardiology Procedures: Echocardiogram   Antibiotics: Anti-infectives    None       Objective: Physical Exam: Vitals:   10/05/16 0300 10/05/16 0808 10/05/16 1251 10/05/16 1636  BP: 132/82 134/76 127/64 114/64  Pulse: 95 95 (!) 125 79  Resp: '16 18  18  '$ Temp:  98 F (36.7 C)    TempSrc:  Oral    SpO2: 96% 97%  96%  Weight: 64 kg (141 lb 3.2 oz)     Height:        Intake/Output Summary (Last 24 hours) at 10/05/16 1639 Last data filed at 10/05/16 1112  Gross per 24 hour  Intake              762 ml  Output               50 ml  Net              712 ml   Filed Weights   10/03/16 0400 10/04/16 0741 10/05/16 0300  Weight: 68.5 kg (151 lb) 66.2 kg (145 lb 14.4 oz) 64 kg (141 lb 3.2 oz)   General: Alert, Awake and Oriented to Time, Place and Person. Appear in moderate distress, affect appropriate Eyes: PERRL, Conjunctiva normal ENT: Oral Mucosa clear moist. Neck: difficult to assess JVD, no Abnormal Mass Or lumps Cardiovascular: S1 and  S2 Present, no Murmur, Respiratory: Bilateral Air entry equal and Decreased, no use of accessory muscle, Clear to Auscultation, on Crackles, no wheezes Abdomen: Bowel Sound present, Soft and diffuse tenderness Skin: no redness, no Rash, no induration Extremities: bilateral Pedal edema, no calf tenderness Neurologic: Grossly no focal neuro deficit. Bilaterally Equal motor strength  Data Reviewed: CBC:  Recent Labs Lab 10/02/16 1735 10/03/16 0331 10/05/16 1033  WBC 17.0* 16.1* 19.1*  NEUTROABS 14.6*  --  16.8*  HGB 11.8* 11.9* 13.4  HCT 38.1 38.1 41.2  MCV 92.5  92.0 90.2  PLT 240 218 423   Basic Metabolic Panel:  Recent Labs Lab 10/02/16 1735 10/03/16 0331 10/04/16 0422 10/05/16 0437  NA 138 136 135 137  K 4.1 4.4 4.0 3.2*  CL 106 104 104 106  CO2 24 23 20* 18*  GLUCOSE 135* 170* 144* 129*  BUN 17 18 31* 43*  CREATININE 1.01* 0.98 1.20* 1.43*  CALCIUM 9.0 9.0 8.7* 9.0  MG  --   --  2.0 2.4    Liver Function Tests:  Recent Labs Lab 10/02/16 1735 10/05/16 1033  AST 25 22  ALT 15 17  ALKPHOS 85 66  BILITOT 1.2 0.6  PROT 7.4 7.7  ALBUMIN 3.9 3.9   No results for input(s): LIPASE, AMYLASE in the last 168 hours. No results for input(s): AMMONIA in the last 168 hours. Coagulation Profile:  Recent Labs Lab 10/02/16 2230 10/03/16 0331 10/04/16 0422 10/05/16 0437  INR 2.51 2.32 2.63 3.20   Cardiac Enzymes:  Recent Labs Lab 10/02/16 1735 10/02/16 2316  TROPONINI 0.04* 0.10*   BNP (last 3 results) No results for input(s): PROBNP in the last 8760 hours. CBG: No results for input(s): GLUCAP in the last 168 hours. Studies: Ct Abdomen Pelvis Wo Contrast  Result Date: 10/05/2016 CLINICAL DATA:  Abdominal pain.  Inpatient. EXAM: CT ABDOMEN AND PELVIS WITHOUT CONTRAST TECHNIQUE: Multidetector CT imaging of the abdomen and pelvis was performed following the standard protocol without IV contrast. COMPARISON:  Abdominal radiograph 1 day prior. 07/06/2015 CT abdomen/pelvis. FINDINGS: Lower chest: Small left and trace right dependent pleural effusions with associated mild dependent compressive left lower lobe atelectasis. Three-vessel coronary atherosclerosis. Tiny sub 5 mm pulmonary nodules at the lung bases are unchanged since 07/06/2015 and considered benign. Hepatobiliary: Normal liver with no liver mass. Cholelithiasis. No gallbladder wall thickening or pericholecystic fluid. No biliary ductal dilatation. Pancreas: Normal, with no mass or duct dilation. Spleen: Normal size. No mass. Adrenals/Urinary Tract: Normal adrenals. Mild  fullness of the central right renal collecting system without overt right hydronephrosis. No left hydronephrosis. No renal stones. No contour deforming renal mass. Limited bladder visualization due to streak artifact, with no gross bladder abnormality. Stomach/Bowel: Grossly normal stomach. Normal caliber small bowel with no small bowel wall thickening. Appendectomy. There is diffuse colonic distention with gas and air-fluid levels, with no large bowel wall thickening or pericolonic fat stranding. No focal large bowel caliber transition. Vascular/Lymphatic: Atherosclerotic nonaneurysmal abdominal aorta. No pathologically enlarged lymph nodes in the abdomen or pelvis. Reproductive: Grossly normal uterus.  No adnexal mass. Other: No pneumoperitoneum, ascites or focal fluid collection. Musculoskeletal: No aggressive appearing focal osseous lesions. Partially visualized right total hip arthroplasty. Healed deformity in the right inferior pubic ramus. Moderate thoracolumbar spondylosis. IMPRESSION: 1. Colonic ileus.  No evidence of bowel obstruction . 2. Small left and trace right dependent pleural effusions. 3. Cholelithiasis. no evidence of acute cholecystitis. No biliary ductal dilatation. 4. Aortic atherosclerosis.  Three-vessel coronary atherosclerosis.  Electronically Signed   By: Ilona Sorrel M.D.   On: 10/05/2016 16:23    Scheduled Meds: . acetaminophen  650 mg Oral TID  . atorvastatin  40 mg Oral QHS  . docusate sodium  100 mg Oral BID  . folic acid  2 mg Oral Daily  . levothyroxine  88 mcg Oral QAC breakfast  . loratadine  10 mg Oral Daily  . magnesium hydroxide  15 mL Oral Daily  . metoprolol tartrate  25 mg Oral BID  . polyethylene glycol  17 g Oral BID  . potassium chloride  40 mEq Oral Once  . spironolactone  25 mg Oral Daily  . Warfarin - Pharmacist Dosing Inpatient   Does not apply q1800   Continuous Infusions: PRN Meds: acetaminophen, ipratropium, LORazepam, ondansetron (ZOFRAN) IV,  sodium chloride  Time spent: 35 minutes  Author: Berle Mull, MD [facility] Pager: [phone] 10/05/2016 4:39 PM  If 7PM-7AM, please contact night-coverage at [email or web address], password [facility]

## 2016-10-05 NOTE — Progress Notes (Signed)
PT Cancellation Note  Patient Details Name: [name] MRN: [id number] DOB: 11-Mar-1924   Cancelled Treatment:    Reason Eval/Treat Not Completed: Patient declined, no reason specified;Patient not medically ready.  Pt feeling nauseate and HR at rest in the 140's.  Will see later as able if pt's vitals stabilize. 10/05/2016  [name], [place] [phone]  (pager)   [name] 10/05/2016, 12:48 PM

## 2016-10-05 NOTE — Progress Notes (Signed)
ANTICOAGULATION CONSULT NOTE - Follow Up Consult  Pharmacy Consult for Coumadin Indication: atrial fibrillation  Allergies  Allergen Reactions  . [name] [Ethacrynic Acid] Nausea And Vomiting  . Tape Other (See Comments)    Tears skin off.  Please use "paper" tape only.  . Furosemide Rash  . Sulfa Antibiotics Itching and Rash  . [name] Rash    Per Dr Sallyanne Kuster, had a skin reaction with thiazides prior to use of Lasix  . Torsemide Rash    Patient Measurements: Height: '5\' 5"'$  (165.1 cm) Weight: 141 lb 3.2 oz (64 kg) IBW/kg (Calculated) : 57  Vital Signs: Temp: 98 F (36.7 C) (05/12 0808) Temp Source: Oral (05/12 0808) BP: 134/76 (05/12 0808) Pulse Rate: 95 (05/12 0808)  Labs:  Recent Labs  10/02/16 1735  10/02/16 2316 10/03/16 0331 10/04/16 0422 10/05/16 0437  HGB 11.8*  --   --  11.9*  --   --   HCT 38.1  --   --  38.1  --   --   PLT 240  --   --  218  --   --   LABPROT  --   < >  --  25.9* 28.6* 33.5*  INR  --   < >  --  2.32 2.63 3.20  CREATININE 1.01*  --   --  0.98 1.20* 1.43*  TROPONINI 0.04*  --  0.10*  --   --   --   < > = values in this interval not displayed.  Estimated Creatinine Clearance: 23.1 mL/min (A) (by C-G formula based on SCr of 1.43 mg/dL (H)).  Assessment: 89yof continues on coumadin for afib. INR supratherapeutic at 3.2. Increase in INR likely related to drug interaction with new amiodarone. CBC stable from 5/10. No s/s bleeding. .   Home dose: 2.'5mg'$  daily except 1.'25mg'$  on Fri per last anticoag clinic note on 4/18 - last taken 5/8  Goal of Therapy:  INR 2-3 Monitor platelets by anticoagulation protocol: Yes   Plan:  1) Hold Coumadin tonight  2) Daily INR, CBC as needed 3) Monitor for s/s bleeding, DDI with amiodarone   Argie Ramming, PharmD Pharmacy Resident  Pager [phone] 10/05/16 10:49 AM

## 2016-10-06 DIAGNOSIS — I060 Rheumatic aortic stenosis: Secondary | ICD-10-CM

## 2016-10-06 DIAGNOSIS — K529 Noninfective gastroenteritis and colitis, unspecified: Secondary | ICD-10-CM

## 2016-10-06 LAB — BASIC METABOLIC PANEL
ANION GAP: 11 (ref 5–15)
BUN: 51 mg/dL — AB (ref 6–20)
CALCIUM: 8.5 mg/dL — AB (ref 8.9–10.3)
CO2: 22 mmol/L (ref 22–32)
Chloride: 107 mmol/L (ref 101–111)
Creatinine, Ser: 1.42 mg/dL — ABNORMAL HIGH (ref 0.44–1.00)
GFR calc Af Amer: 36 mL/min — ABNORMAL LOW (ref >60)
GFR, EST NON AFRICAN AMERICAN: 31 mL/min — AB (ref >60)
GLUCOSE: 107 mg/dL — AB (ref 65–99)
POTASSIUM: 3.2 mmol/L — AB (ref 3.5–5.1)
SODIUM: 140 mmol/L (ref 135–145)

## 2016-10-06 LAB — PROCALCITONIN: PROCALCITONIN: 0.21 ng/mL

## 2016-10-06 LAB — PROTIME-INR
INR: 4.35 — AB
Prothrombin Time: 42.8 seconds — ABNORMAL HIGH (ref 11.4–15.2)

## 2016-10-06 MED ORDER — POTASSIUM CHLORIDE CRYS ER 20 MEQ PO TBCR
40.0000 meq | EXTENDED_RELEASE_TABLET | Freq: Once | ORAL | Status: AC
  Administered 2016-10-06: 40 meq via ORAL
  Filled 2016-10-06: qty 2

## 2016-10-06 NOTE — Progress Notes (Addendum)
Advanced Heart Failure Rounding Note  PCP: [name], MD Primary Cardiologist: Dr Sallyanne Kuster   Subjective:    Admitted 10/02/16 with acute diastolic CHF, Afib RVR.   She is allergic to furosemide, torsemide. Has nausea with ethacrynic acid.   Amiodarone stopped due to ongoing nausea.   Yesterday had worsening ab pain and distension. CT ab with ileus. No SBO (I reviewed personally with Radiology)  Feels much better this am. Tolerating clear liquids. No belly pain or n/v. No SOB/CP. AF rate much improved as belly getting better 85-100. Weight down 5 pounds.    Objective:   Weight Range: 61.3 kg (135 lb 3.2 oz) Body mass index is 22.5 kg/m.   Vital Signs:   Temp:  [97.4 F (36.3 C)-98.3 F (36.8 C)] 98 F (36.7 C) (05/13 0819) Pulse Rate:  [79-125] 90 (05/13 0819) Resp:  [17-20] 20 (05/13 0819) BP: (114-145)/(64-87) 145/87 (05/13 0819) SpO2:  [95 %-96 %] 95 % (05/13 0442) Weight:  [61.3 kg (135 lb 3.2 oz)] 61.3 kg (135 lb 3.2 oz) (05/13 0442) Last BM Date: 10/05/16  Weight change: Filed Weights   10/04/16 0741 10/05/16 0300 10/06/16 0442  Weight: 66.2 kg (145 lb 14.4 oz) 64 kg (141 lb 3.2 oz) 61.3 kg (135 lb 3.2 oz)    Intake/Output:   Intake/Output Summary (Last 24 hours) at 10/06/16 0821 Last data filed at 10/06/16 6301  Gross per 24 hour  Intake              582 ml  Output              900 ml  Net             -318 ml     Physical Exam: General:  Sitting in chair eating breakfast No resp difficulty HEENT: normal Neck: supple. JVP 6  Carotids 2+ bilat; no bruits. No lymphadenopathy or thryomegaly appreciated. Cor: PMI nondisplaced. IRR IRR 2/6 AS Lungs: clear Abdomen: soft, nontender, mildly distended. No hepatosplenomegaly. No bruits or masses. Good bowel sounds. Extremities: no cyanosis, clubbing, rash, edema Neuro: alert & orientedx3, cranial nerves grossly intact. moves all 4 extremities w/o difficulty. Affect pleasant    Telemetry: Afib,  rates in the 90's - 140's.  Personally reviewed   Labs: CBC  Recent Labs  10/05/16 1033  WBC 19.1*  NEUTROABS 16.8*  HGB 13.4  HCT 41.2  MCV 90.2  PLT 601   Basic Metabolic Panel  Recent Labs  10/04/16 0422 10/05/16 0437 10/06/16 0512  NA 135 137 140  K 4.0 3.2* 3.2*  CL 104 106 107  CO2 20* 18* 22  GLUCOSE 144* 129* 107*  BUN 31* 43* 51*  CREATININE 1.20* 1.43* 1.42*  CALCIUM 8.7* 9.0 8.5*  MG 2.0 2.4  --    Liver Function Tests  Recent Labs  10/05/16 1033  AST 22  ALT 17  ALKPHOS 66  BILITOT 0.6  PROT 7.7  ALBUMIN 3.9   Cardiac Enzymes No results for input(s): CKTOTAL, CKMB, CKMBINDEX, TROPONINI in the last 72 hours.  BNP: BNP (last 3 results)  Recent Labs  10/02/16 1735  BNP 317.8*    Thyroid Function Tests No results for input(s): TSH, T4TOTAL, T3FREE, THYROIDAB in the last 72 hours.  Invalid input(s): FREET3   Transthoracic Echocardiography Study Conclusions  - Left ventricle: The cavity size was normal. Wall thickness was   increased in a pattern of mild LVH. Systolic function was   vigorous. The estimated ejection  fraction was in the range of 65%   to 70%. Wall motion was normal; there were no regional wall   motion abnormalities. - Aortic valve: Valve mobility was restricted. There was moderate   stenosis. Peak velocity (S): 321 cm/s. - Mitral valve: Calcified annulus. Mildly thickened leaflets .   There was mild regurgitation. - Right atrium: The atrium was mildly dilated. - Tricuspid valve: There was moderate regurgitation. - Pulmonary arteries: Systolic pressure was moderately increased.   PA peak pressure: 60 mm Hg (S). - Pericardium, extracardiac: A trivial pericardial effusion was   identified posterior to the heart.  Impressions:  - Compared to the prior study, there has been no significant   interval change.     Medications:     Scheduled Medications: . acetaminophen  650 mg Oral TID  . atorvastatin  40  mg Oral QHS  . docusate sodium  100 mg Oral BID  . folic acid  2 mg Oral Daily  . levothyroxine  88 mcg Oral QAC breakfast  . magnesium hydroxide  15 mL Oral Daily  . metoprolol tartrate  25 mg Oral BID  . polyethylene glycol  17 g Oral BID  . spironolactone  25 mg Oral Daily  . Warfarin - Pharmacist Dosing Inpatient   Does not apply q1800    Infusions:   PRN Medications: acetaminophen, ipratropium, LORazepam, ondansetron (ZOFRAN) IV, sodium chloride   Assessment/Plan   1. Acute on chronic diastolic CHF:  - Exacerbated by Afib - weight down today below baseline. Creatinine up from baseline but stable from yesterday.  -Continue to hold diuretics.  - Will likely need low-dose ethacrynic acid on discharge. 2. Persistent atrial fibrillation: - Amiodarone stopped due to ongoing nausea. (Failed amio in past as it was ineffective at maintaining NSR)  - AF rates rmuch improved as belly pain improved. Continue lopressor. - Continue warfarin - Has had 3 failed DCCV in the past.  - This patients CHA2DS2-VASc Score and unadjusted Ischemic Stroke Rate (% per year) is equal to 4.8 % stroke rate/year from a score of 4  - INR up. Holding warfarin. D/w PharmD.  3. Aortic stenosis:  - Echo this admit. EF 65% moderate AS (mean gradient 21)  4. Leukocytosis and ab distension  - Much improved today. CT reviewed personally suggests ileus. No SBO. Suspect she had viral gastroenteritis. Now improving - UA ok. Wall.  5. AKI - perhaps pre-renal. Diuretics on hold 6. Hypokalemia - continue to supp gently  Length of Stay: 4   Glori Bickers, MD  10/06/2016, 8:21 AM  Advanced Heart Failure Team Pager [phone] (M-F; 7a - 4p)  Please contact [facility] for night-coverage after hours (4p -7a ) and weekends on [email or web address]

## 2016-10-06 NOTE — Progress Notes (Signed)
[facility] Progress Note  Patient: [name] [id number]   PCP: [name], MD DOB: Aug 11, 1923   DOA: 10/02/2016   DOS: 10/06/2016   Date of Service: the patient was seen and examined on 10/06/2016  Subjective: Abdominal pain is resolved. No vomiting no nausea as well.  Brief hospital course: Pt. with PMH of Chronic dCHF, chronic A. fib on anticoagulation, mod AS, HTN, CAD s/p PCI 2010, hypothyroidism; admitted on 10/02/2016, presented with complaint of shortnes of breath, was found to have A fib with RVR and acute on chronic CHF. Currently further plan is continue treating ileus  Assessment and Plan: Paroxysmal atrialfibrillation on chronic anticoagulation Acute on chronic diastolic dysfunction Patient presented in A. fib with RVR with heart rates into the 160s. Patient was electrically cardioverted at least 4 times all in the ED, but remained in persistent A. fib. Cardiology consulted and recommended placement on patient on amiodarone drip.  Coumadin and per pharmacy Amiodarone drip stopped now due to GI symptoms. Echocardiogram suggest no new changes and preserved EF Appreciate cardiology consultative services. Currently ethacrynic acid on hold.  Elevated cardiac troponin- demand ischemia  secondary to supply demand with patient having rapid atrial fibrillation. Subtle ST elevations on coumadin therapeutic INR,   Colonic ileus without any bowel obstruction. Nausea and vomiting Was on clear liquid diet, continue when necessary Zofran, continue MiraLAX. Change Senokot and Colace. Ambulate every 4 hour, out of bed 3 times a day. Replace potassium. Advance to full liquid diet. Will need NG tube decompression if started vomiting. May require Reglan but currently holding due to abdominal pain.  CAD s/p PCI: Patient underwent placement of bare-metal stent to RCA in 2010.   Anemia: Hemoglobin stable - Continue to monitor  Hyperlipidemia - Continue  atorvastatin  Hypothyroidism - mildly elevated TSH, check free t4.  - Continue Levothyroxine  Diet: clear liquid diet DVT Prophylaxis: Heparin  Advance goals of care discussion: full code  Family Communication: no family was present at bedside, at the time of interview.  Disposition:  Discharge to SNF.  Consultants: cardiology Procedures: Echocardiogram   Antibiotics: Anti-infectives    None       Objective: Physical Exam: Vitals:   10/06/16 0500 10/06/16 0600 10/06/16 0819 10/06/16 1406  BP:   (!) 145/87 118/70  Pulse:   90 79  Resp: '17 17 20 19  '$ Temp:   98 F (36.7 C) 97.9 F (36.6 C)  TempSrc:   Oral Oral  SpO2:      Weight:      Height:        Intake/Output Summary (Last 24 hours) at 10/06/16 1804 Last data filed at 10/06/16 0900  Gross per 24 hour  Intake              180 ml  Output              900 ml  Net             -720 ml   Filed Weights   10/04/16 0741 10/05/16 0300 10/06/16 0442  Weight: 66.2 kg (145 lb 14.4 oz) 64 kg (141 lb 3.2 oz) 61.3 kg (135 lb 3.2 oz)   General: Alert, Awake and Oriented to Time, Place and Person. Appear in mild distress, affect appropriate Eyes: PERRL, Conjunctiva normal ENT: Oral Mucosa clear moist. Neck: difficult to assess JVD, no Abnormal Mass Or lumps Cardiovascular: S1 and S2 Present, no Murmur, Peripheral Pulses Present Respiratory: Bilateral Air entry equal and Decreased,  no use of accessory muscle, Clear to Auscultation, no Crackles, no wheezes Abdomen: Bowel Sound present, Soft and no tenderness Skin: redness no, no Rash, no induration Extremities: bilateral Pedal edema, no calf tenderness  Data Reviewed: CBC:  Recent Labs Lab 10/02/16 1735 10/03/16 0331 10/05/16 1033  WBC 17.0* 16.1* 19.1*  NEUTROABS 14.6*  --  16.8*  HGB 11.8* 11.9* 13.4  HCT 38.1 38.1 41.2  MCV 92.5 92.0 90.2  PLT 240 218 224   Basic Metabolic Panel:  Recent Labs Lab 10/02/16 1735 10/03/16 0331 10/04/16 0422  10/05/16 0437 10/06/16 0512  NA 138 136 135 137 140  K 4.1 4.4 4.0 3.2* 3.2*  CL 106 104 104 106 107  CO2 24 23 20* 18* 22  GLUCOSE 135* 170* 144* 129* 107*  BUN 17 18 31* 43* 51*  CREATININE 1.01* 0.98 1.20* 1.43* 1.42*  CALCIUM 9.0 9.0 8.7* 9.0 8.5*  MG  --   --  2.0 2.4  --     Liver Function Tests:  Recent Labs Lab 10/02/16 1735 10/05/16 1033  AST 25 22  ALT 15 17  ALKPHOS 85 66  BILITOT 1.2 0.6  PROT 7.4 7.7  ALBUMIN 3.9 3.9   No results for input(s): LIPASE, AMYLASE in the last 168 hours. No results for input(s): AMMONIA in the last 168 hours. Coagulation Profile:  Recent Labs Lab 10/02/16 2230 10/03/16 0331 10/04/16 0422 10/05/16 0437 10/06/16 0512  INR 2.51 2.32 2.63 3.20 4.35*   Cardiac Enzymes:  Recent Labs Lab 10/02/16 1735 10/02/16 2316  TROPONINI 0.04* 0.10*   BNP (last 3 results) No results for input(s): PROBNP in the last 8760 hours. CBG: No results for input(s): GLUCAP in the last 168 hours. Studies: No results found.  Scheduled Meds: . acetaminophen  650 mg Oral TID  . atorvastatin  40 mg Oral QHS  . docusate sodium  100 mg Oral BID  . folic acid  2 mg Oral Daily  . levothyroxine  88 mcg Oral QAC breakfast  . magnesium hydroxide  15 mL Oral Daily  . metoprolol tartrate  25 mg Oral BID  . polyethylene glycol  17 g Oral BID  . spironolactone  25 mg Oral Daily  . Warfarin - Pharmacist Dosing Inpatient   Does not apply q1800   Continuous Infusions: PRN Meds: acetaminophen, ipratropium, LORazepam, ondansetron (ZOFRAN) IV, sodium chloride  Time spent: 35 minutes  Author: Berle Mull, MD [facility] Pager: [phone] 10/06/2016 6:04 PM  If 7PM-7AM, please contact night-coverage at [email or web address], password [facility]

## 2016-10-06 NOTE — Evaluation (Signed)
Physical Therapy Evaluation Patient Details Name: [name] MRN: [id number] DOB: June 29, 1923 Today's Date: 10/06/2016   History of Present Illness  Patient is a 83 y/o female who presents with SOB. Found to have A fib with RVR and acute on chronic CHF. Shocked 4 times in ED. PMH includes chronic dCHF, A-fib on anticoagulation, HTN, CAD s/p PCI 2010, hypothyroidism  Clinical Impression  Patient presents with generalized weakness, deconditioning, balance deficits, tachycardia and impaired mobility s/p above. Pt independent PTA and lives with spouse. Today pt requires Min A for balance during gait training but balance improved with use of RW for support. HR ranged from 90s-139 bpm. Pt has 24/7 supervision from spouse at home. Recommend return home with HHPT, [place] and 24/7. Will follow acutely to maximize independence and mobility prior to return home.    Follow Up Recommendations Home health PT;Supervision for mobility/OOB    Equipment Recommendations  Rolling walker with 5" wheels    Recommendations for Other Services OT consult     Precautions / Restrictions        Mobility  Bed Mobility Overal bed mobility: Needs Assistance Bed Mobility: Rolling;Sidelying to Sit Rolling: Supervision Sidelying to sit: Supervision;HOB elevated       General bed mobility comments: increased time and rail to get to EOB, no assist needed.  Transfers Overall transfer level: Needs assistance Equipment used: None Transfers: Sit to/from Stand Sit to Stand: Min guard         General transfer comment: Min guard to steady in standing. transferred to chair post ambulation bout.  Ambulation/Gait Ambulation/Gait assistance: Min assist;Min guard Ambulation Distance (Feet): 15 Feet (+ 30') Assistive device: Rolling walker (2 wheeled);None Gait Pattern/deviations: Step-through pattern;Decreased stride length;Narrow base of support Gait velocity: decreasd   General Gait Details: Slow, guarded and  unsteady gait with Min A for balance and pt reaching for funriture for support without RW progressing to min guard assist with RW. HR ranged from 90-139 bpm.   Stairs            Wheelchair Mobility    Modified Rankin (Stroke Patients Only)       Balance Overall balance assessment: Needs assistance Sitting-balance support: Feet supported;No upper extremity supported Sitting balance-Leahy Scale: Good Sitting balance - Comments: Able to reach and grab toilet paper without support.   Standing balance support: During functional activity;Single extremity supported Standing balance-Leahy Scale: Fair Standing balance comment: Requires at least 1 Ue support in standing but does better with BUE support, able to perform pericare in standing wtihout Ue support.                             Pertinent Vitals/Pain Pain Assessment: No/denies pain    Home Living Family/patient expects to be discharged to:: Private residence Living Arrangements: Spouse/significant other Available Help at Discharge: Family;Available 24 hours/day Type of Home: House Home Access: Stairs to enter Entrance Stairs-Rails: Right Entrance Stairs-Number of Steps: 1 Home Layout: One level Home Equipment: Walker - 2 wheels;Shower seat      Prior Function Level of Independence: Independent         Comments: Cooks, cleans, takes care of house     Hand Dominance   Dominant Hand: Right    Extremity/Trunk Assessment   Upper Extremity Assessment Upper Extremity Assessment: Defer to OT evaluation    Lower Extremity Assessment Lower Extremity Assessment: Generalized weakness    Cervical / Trunk Assessment Cervical / Trunk Assessment:  Kyphotic  Communication   Communication: No difficulties  Cognition Arousal/Alertness: Awake/alert Behavior During Therapy: WFL for tasks assessed/performed Overall Cognitive Status: Within Functional Limits for tasks assessed                                         General Comments      Exercises     Assessment/Plan    PT Assessment Patient needs continued PT services  PT Problem List Decreased strength;Decreased mobility;Decreased activity tolerance;Cardiopulmonary status limiting activity;Decreased balance       PT Treatment Interventions Therapeutic activities;Gait training;Therapeutic exercise;Patient/family education;Balance training;Functional mobility training    PT Goals (Current goals can be found in the Care Plan section)  Acute Rehab PT Goals Patient Stated Goal: to go home PT Goal Formulation: With patient Time For Goal Achievement: 10/20/16 Potential to Achieve Goals: Good    Frequency Min 3X/week   Barriers to discharge        Co-evaluation               AM-PAC PT "6 Clicks" Daily Activity  Outcome Measure Difficulty turning over in bed (including adjusting bedclothes, sheets and blankets)?: None Difficulty moving from lying on back to sitting on the side of the bed? : None Difficulty sitting down on and standing up from a chair with arms (e.g., wheelchair, bedside commode, etc,.)?: None Help needed moving to and from a bed to chair (including a wheelchair)?: A Little Help needed walking in hospital room?: A Little Help needed climbing 3-5 steps with a railing? : A Lot 6 Click Score: 20    End of Session Equipment Utilized During Treatment: Gait belt Activity Tolerance: Patient tolerated treatment well Patient left: in chair;with call bell/phone within reach Nurse Communication: Mobility status PT Visit Diagnosis: Unsteadiness on feet (R26.81);Muscle weakness (generalized) (M62.81)    Time: 5176-1607 PT Time Calculation (min) (ACUTE ONLY): 23 min   Charges:   PT Evaluation $PT Eval Moderate Complexity: 1 Procedure PT Treatments $Gait Training: 8-22 mins   PT G Codes:        Wray Kearns, PT, DPT [phone]    Marguarite Arbour A Kiernan Farkas 10/06/2016, 7:53 AM

## 2016-10-06 NOTE — Progress Notes (Signed)
ANTICOAGULATION CONSULT NOTE - Follow Up Consult  Pharmacy Consult for Coumadin Indication: atrial fibrillation  Allergies  Allergen Reactions  . [name] [Ethacrynic Acid] Nausea And Vomiting  . Tape Other (See Comments)    Tears skin off.  Please use "paper" tape only.  . Furosemide Rash  . Sulfa Antibiotics Itching and Rash  . [name] Rash    Per Dr Sallyanne Kuster, had a skin reaction with thiazides prior to use of Lasix  . Torsemide Rash    Patient Measurements: Height: '5\' 5"'$  (165.1 cm) Weight: 135 lb 3.2 oz (61.3 kg) IBW/kg (Calculated) : 57  Vital Signs: Temp: 98 F (36.7 C) (05/13 0819) Temp Source: Oral (05/13 0819) BP: 145/87 (05/13 0819) Pulse Rate: 90 (05/13 0819)  Labs:  Recent Labs  10/04/16 0422 10/05/16 0437 10/05/16 1033 10/06/16 0512  HGB  --   --  13.4  --   HCT  --   --  41.2  --   PLT  --   --  274  --   LABPROT 28.6* 33.5*  --  42.8*  INR 2.63 3.20  --  4.35*  CREATININE 1.20* 1.43*  --  1.42*    Estimated Creatinine Clearance: 23.2 mL/min (A) (by C-G formula based on SCr of 1.42 mg/dL (H)).  Assessment: 89yof continues on coumadin for afib. INR supratherapeutic at 4.35. Dose held yesterday on 4/12 and will continue to hold. Increase in INR likely related to drug interaction with amiodarone. CBC stable. No s/s bleeding. .   Home dose: 2.'5mg'$  daily except 1.'25mg'$  on Fri per last anticoag clinic note on 4/18 - last taken 5/8  Goal of Therapy:  INR 2-3 Monitor platelets by anticoagulation protocol: Yes   Plan:  1) Hold Coumadin tonight  2) Daily INR, CBC as needed 3) Monitor for s/s bleeding, DDI with amiodarone   Argie Ramming, PharmD Pharmacy Resident  Pager [phone] 10/06/16 10:09 AM

## 2016-10-07 DIAGNOSIS — N179 Acute kidney failure, unspecified: Secondary | ICD-10-CM

## 2016-10-07 LAB — MAGNESIUM: Magnesium: 2.4 mg/dL (ref 1.7–2.4)

## 2016-10-07 LAB — BASIC METABOLIC PANEL
Anion gap: 9 (ref 5–15)
BUN: 35 mg/dL — AB (ref 6–20)
CALCIUM: 8.3 mg/dL — AB (ref 8.9–10.3)
CO2: 21 mmol/L — ABNORMAL LOW (ref 22–32)
CREATININE: 0.91 mg/dL (ref 0.44–1.00)
Chloride: 110 mmol/L (ref 101–111)
GFR calc Af Amer: >60 mL/min (ref >60)
GFR, EST NON AFRICAN AMERICAN: 54 mL/min — AB (ref >60)
GLUCOSE: 98 mg/dL (ref 65–99)
Potassium: 3.6 mmol/L (ref 3.5–5.1)
SODIUM: 140 mmol/L (ref 135–145)

## 2016-10-07 LAB — CBC
HCT: 41.8 % (ref 36.0–46.0)
Hemoglobin: 13.5 g/dL (ref 12.0–15.0)
MCH: 29.5 pg (ref 26.0–34.0)
MCHC: 32.3 g/dL (ref 30.0–36.0)
MCV: 91.3 fL (ref 78.0–100.0)
PLATELETS: 210 10*3/uL (ref 150–400)
RBC: 4.58 MIL/uL (ref 3.87–5.11)
RDW: 15.4 % (ref 11.5–15.5)
WBC: 11.7 10*3/uL — ABNORMAL HIGH (ref 4.0–10.5)

## 2016-10-07 LAB — PROTIME-INR
INR: 4.82 — AB
Prothrombin Time: 46.5 seconds — ABNORMAL HIGH (ref 11.4–15.2)

## 2016-10-07 LAB — PROCALCITONIN: Procalcitonin: <0.1 ng/mL

## 2016-10-07 MED ORDER — METOCLOPRAMIDE HCL 5 MG PO TABS
5.0000 mg | ORAL_TABLET | Freq: Three times a day (TID) | ORAL | Status: DC
  Administered 2016-10-07 – 2016-10-08 (×3): 5 mg via ORAL
  Filled 2016-10-07 (×3): qty 1

## 2016-10-07 MED ORDER — CEPHALEXIN 500 MG PO CAPS
500.0000 mg | ORAL_CAPSULE | Freq: Two times a day (BID) | ORAL | Status: DC
  Administered 2016-10-07 – 2016-10-08 (×2): 500 mg via ORAL
  Filled 2016-10-07 (×2): qty 1

## 2016-10-07 NOTE — Clinical Social Work Placement (Addendum)
   CLINICAL SOCIAL WORK PLACEMENT  NOTE  Date:  10/07/2016  Patient Details  Name: [name] MRN: [id number] Date of Birth: June 20, 1923  Clinical Social Work is seeking post-discharge placement for this patient at the [place] level of care (*CSW will initial, date and re-position this form in  chart as items are completed):      Patient/family provided with [place] Work Department's list of facilities offering this level of care within the geographic area requested by the patient (or if unable, by the patient's family).  Yes   Patient/family informed of their freedom to choose among providers that offer the needed level of care, that participate in Medicare, Medicaid or managed care program needed by the patient, have an available bed and are willing to accept the patient.      Patient/family informed of [facility]'s ownership interest in [facility] and [facility], as well as of the fact that they are under no obligation to receive care at these facilities.  PASRR submitted to EDS on       PASRR number received on 10/07/16     Existing PASRR number confirmed on       FL2 transmitted to all facilities in geographic area requested by pt/family on 10/07/16     FL2 transmitted to all facilities within larger geographic area on       Patient informed that his/her managed care company has contracts with or will negotiate with certain facilities, including the following:        Yes   Patient/family informed of bed offers received.  Patient chooses bed at [facility]     Physician recommends and patient chooses bed at      Patient to be transferred to [facility] on 10/08/16.  Patient to be transferred to facility by [facility]     Patient family notified on 10/08/16 of transfer.  Name of family member notified:  Sheran Spine     PHYSICIAN       Additional Comment:    _______________________________________________ Eileen Stanford,  LCSW 10/07/2016, 3:12 PM

## 2016-10-07 NOTE — Progress Notes (Signed)
Advanced Heart Failure Rounding Note  PCP: [name], MD Primary Cardiologist: Dr Sallyanne Kuster   Subjective:    Admitted 10/02/16 with acute diastolic CHF, Afib RVR.   She is allergic to furosemide, torsemide. Has nausea with ethacrynic acid.   Amiodarone stopped due to ongoing nausea.   Over the weekend had worsening ab pain and distension. CT ab with ileus. No SBO (I reviewed personally with Radiology)  Continues to feel better this am. Tolerating clear liquids. No belly pain or n/v. No SOB/CP.   AF rate much improved as belly getting better. Now 70-80s Weight down 16 pounds since admit. Holding diuretics   Objective:   Weight Range: 61.3 kg (135 lb 3.2 oz) Body mass index is 22.5 kg/m.   Vital Signs:   Temp:  [97.9 F (36.6 C)-98.5 F (36.9 C)] 98.2 F (36.8 C) (05/14 0100) Pulse Rate:  [79-90] 86 (05/14 0100) Resp:  [17-20] 17 (05/14 0100) BP: (118-147)/(70-98) 147/98 (05/14 0100) SpO2:  [97 %] 97 % (05/13 2013) Last BM Date: 10/06/16  Weight change: Filed Weights   10/04/16 0741 10/05/16 0300 10/06/16 0442  Weight: 66.2 kg (145 lb 14.4 oz) 64 kg (141 lb 3.2 oz) 61.3 kg (135 lb 3.2 oz)    Intake/Output:   Intake/Output Summary (Last 24 hours) at 10/07/16 0558 Last data filed at 10/06/16 2200  Gross per 24 hour  Intake              480 ml  Output              400 ml  Net               80 ml     Physical Exam: General:  Lying in bed  HEENT: normal Neck: supple. JVP 5  Carotids 2+ bilat; no bruits. No lymphadenopathy or thryomegaly appreciated. Cor: PMI nondisplaced. IRR IRR 2/6 AS Lungs: clear no wheeze Abdomen: soft, NT/ND . No hepatosplenomegaly. No bruits or masses. Good bowel sounds. Extremities: no cyanosis, clubbing, rash, no edema  Neuro: alert & orientedx3, cranial nerves grossly intact. moves all 4 extremities w/o difficulty. Affect pleasant    Telemetry: Afib 70-80s.  Personally reviewed    Labs: CBC  Recent Labs   10/05/16 1033  WBC 19.1*  NEUTROABS 16.8*  HGB 13.4  HCT 41.2  MCV 90.2  PLT 102   Basic Metabolic Panel  Recent Labs  10/05/16 0437 10/06/16 0512  NA 137 140  K 3.2* 3.2*  CL 106 107  CO2 18* 22  GLUCOSE 129* 107*  BUN 43* 51*  CREATININE 1.43* 1.42*  CALCIUM 9.0 8.5*  MG 2.4  --    Liver Function Tests  Recent Labs  10/05/16 1033  AST 22  ALT 17  ALKPHOS 66  BILITOT 0.6  PROT 7.7  ALBUMIN 3.9   Cardiac Enzymes No results for input(s): CKTOTAL, CKMB, CKMBINDEX, TROPONINI in the last 72 hours.  BNP: BNP (last 3 results)  Recent Labs  10/02/16 1735  BNP 317.8*    Thyroid Function Tests No results for input(s): TSH, T4TOTAL, T3FREE, THYROIDAB in the last 72 hours.  Invalid input(s): FREET3   Transthoracic Echocardiography Study Conclusions  - Left ventricle: The cavity size was normal. Wall thickness was   increased in a pattern of mild LVH. Systolic function was   vigorous. The estimated ejection fraction was in the range of 65%   to 70%. Wall motion was normal; there were no regional wall  motion abnormalities. - Aortic valve: Valve mobility was restricted. There was moderate   stenosis. Peak velocity (S): 321 cm/s. - Mitral valve: Calcified annulus. Mildly thickened leaflets .   There was mild regurgitation. - Right atrium: The atrium was mildly dilated. - Tricuspid valve: There was moderate regurgitation. - Pulmonary arteries: Systolic pressure was moderately increased.   PA peak pressure: 60 mm Hg (S). - Pericardium, extracardiac: A trivial pericardial effusion was   identified posterior to the heart.  Impressions:  - Compared to the prior study, there has been no significant   interval change.     Medications:     Scheduled Medications: . acetaminophen  650 mg Oral TID  . atorvastatin  40 mg Oral QHS  . docusate sodium  100 mg Oral BID  . folic acid  2 mg Oral Daily  . levothyroxine  88 mcg Oral QAC breakfast  .  magnesium hydroxide  15 mL Oral Daily  . metoprolol tartrate  25 mg Oral BID  . polyethylene glycol  17 g Oral BID  . spironolactone  25 mg Oral Daily  . Warfarin - Pharmacist Dosing Inpatient   Does not apply q1800    Infusions:   PRN Medications: acetaminophen, ipratropium, LORazepam, ondansetron (ZOFRAN) IV, sodium chloride   Assessment/Plan   1. Acute on chronic diastolic CHF:  - Exacerbated by Afib - weight down today below baseline. BMET pending  -Continue to hold diuretics.  - Will likely need low-dose ethacrynic acid on discharge. Consider 25 mg on Monday and Friday + prn 2. Persistent atrial fibrillation: - Amiodarone stopped due to ongoing nausea. (Failed amio in past as it was ineffective at maintaining NSR)  - AF rates much improved as belly pain improved. Continue lopressor. - Continue warfarin. await INR  Was 4.35 yesterday D/w PharmD - Has had 3 failed DCCV in the past.  - This patients CHA2DS2-VASc Score and unadjusted Ischemic Stroke Rate (% per year) is equal to 4.8 % stroke rate/year from a score of 4  3. Aortic stenosis:  - Echo this admit. EF 65% moderate AS (mean gradient 21)  4. Leukocytosis and ab distension  - Much improved today. CT reviewed personally suggests ileus. No SBO. Suspect she had viral gastroenteritis. Now improving - UA ok.  - [place] NGTD 5. AKI - perhaps pre-renal. Diuretics on hold await BMET  6. Hypokalemia - continue to supp as needed  Likely can go to SNF today or tomorrow if INR coming down and renal function stable.   We will sign off. Please call with questions.  Length of Stay: 5   Glori Bickers, MD  10/07/2016, 5:58 AM  Advanced Heart Failure Team Pager [phone] (M-F; 7a - 4p)  Please contact [facility] for night-coverage after hours (4p -7a ) and weekends on [email or web address]

## 2016-10-07 NOTE — Clinical Social Work Note (Signed)
Clinical Social Work Assessment  Patient Details  Name: [name] MRN: [id number] Date of Birth: [birth year]  Date of referral:  10/07/16               Reason for consult:  Facility Placement                Permission sought to share information with:  Family Supports Permission granted to share information::  Yes, Verbal Permission Granted  Name::     Solicitor::     Relationship::  Spouse  Contact Information:  [phone]  Housing/Transportation Living arrangements for the past 2 months:  Single Family Home Source of Information:  Patient Patient Interpreter Needed:  None Criminal Activity/Legal Involvement Pertinent to Current Situation/Hospitalization:    Significant Relationships:  Adult Children, Spouse Lives with:  Spouse Do you feel safe going back to the place where you live?    Need for family participation in patient care:     Care giving concerns:  Pt's spouse present at bedside during initial assessment.    Social Worker assessment / plan:  CSW spoke with pt at bedside to complete initial assessment. Pt lives at home with her spouse. Pt agreeable to SNF placement at d/c. Pt prefers a facility in [place] as it is close to their home and it would be easier for her husband to be able to get there. Pt also states as backup she would go to [facility]. CSW will follow up with b/o once available.   Employment status:  Retired Nurse, adult PT Recommendations:  [place] / Referral to community resources:  [place]  Patient/Family's Response to care:  Pt verbalized understanding of CSW role and expressed appreciation for support. Pt denies any concern regarding pt care at this time.  Patient/Family's Understanding of and Emotional Response to Diagnosis, Current Treatment, and Prognosis:  Pt understanding and realistic regarding physical limitations. Pt understands the need for SNF placement  at d/c. Pt agreeable to SNF placement at d/c, at this time. Pt's responses emotionally appropriate during conversation with CSW. Pt denies any concern regarding treatment plan at this time. CSW will continue to provide support and facilitate d/c needs.   Emotional Assessment Appearance:  Appears stated age Attitude/Demeanor/Rapport:   (Patient was appropriate.) Affect (typically observed):  Accepting, Appropriate, Calm Orientation:  Oriented to Situation, Oriented to  Time, Oriented to Place, Oriented to Self Alcohol / Substance use:  Not Applicable Psych involvement (Current and /or in the community):  No (Comment)  Discharge Needs  Concerns to be addressed:  No discharge needs identified Readmission within the last 30 days:  No Current discharge risk:  Dependent with Mobility Barriers to Discharge:  Continued Medical Work up   [employer], LCSW 10/07/2016, 3:09 PM

## 2016-10-07 NOTE — Care Management Important Message (Signed)
Important Message  Patient Details  Name: [name] MRN: [id number] Date of Birth: [birth year]   Medicare Important Message Given:  Yes    Nathen May 10/07/2016, 2:15 PM

## 2016-10-07 NOTE — Progress Notes (Addendum)
[facility] Progress Note  Patient: [name] RPR:[id number]   PCP: [name], MD DOB: December 24, 1923   DOA: 10/02/2016   DOS: 10/07/2016   Date of Service: the patient was seen and examined on 10/07/2016  Subjective: Earlier in the morning the patient was feeling better did not have any nausea or abdominal pain and was tolerating full liquid diet. Later at the lunchtime she started having nausea again.  Brief hospital course: Pt. with PMH of Chronic dCHF, chronic A. fib on anticoagulation, mod AS, HTN, CAD s/p PCI 2010, hypothyroidism; admitted on 10/02/2016, presented with complaint of shortnes of breath, was found to have A fib with RVR and acute on chronic CHF. Currently further plan is continue treating ileus  Assessment and Plan: Paroxysmal atrialfibrillation on chronic anticoagulation Acute on chronic diastolic dysfunction Patient presented in A. fib with RVR with heart rates into the 160s. Patient was electrically cardioverted at least 4 times all in the ED, but remained in persistent A. fib.  Cardiology consulted and pt was placed on amiodarone drip. Coumadin and per pharmacy. Amiodarone drip stopped now due to GI symptoms. Echocardiogram suggest no new changes and preserved EF Appreciate cardiology consultative services.  Diuresed with ethacrynic acid, now stopped  And only on aldactone.   Elevated cardiac troponin- demand ischemia  secondary to supply demand with patient having rapid atrial fibrillation. Subtle ST elevations on coumadin therapeutic INR,  No ischemic work up recommended  Colonic ileus without any bowel obstruction. Nausea and vomiting Was on clear liquid diet, continue when necessary Zofran, continue MiraLAX.  Change to soft diet and scheduled reglan  Change Senokot and Colace. Ambulate every 4 hour, out of bed 3 times a day. Replace potassium.  E coli UTI Urine culture is growing Escherichia coli. The patient does not have any fever she had  leukocytosis and is feeling lethargic for last few days. I would treat her with 3 days of Keflex.  CAD s/p PCI:  Patient underwent placement of bare-metal stent to RCA in 2010.   Anemia: Hemoglobin stable - Continue to monitor  Hyperlipidemia - Continue atorvastatin  Hypothyroidism - mildly elevated TSH, check free t4.  - Continue Levothyroxine  Hypomagnesemia  Replaced and now stable   Diet: Advance to soft diet DVT Prophylaxis: on therapeutic anticoagulation.  Advance goals of care discussion: Full code  Family Communication: family was present at bedside, at the time of interview. The pt provided permission to discuss medical plan with the family. Opportunity was given to ask question and all questions were answered satisfactorily.   Disposition:  Discharge to SNF, tomorrow 10/08/2016.  Consultants: Cardiology Procedures: Echocardiogram  Antibiotics: Anti-infectives    Start     Dose/Rate Route Frequency Ordered Stop   10/07/16 1400  cephALEXin (KEFLEX) capsule 500 mg     500 mg Oral Every 12 hours 10/07/16 1325         Objective: Physical Exam: Vitals:   10/06/16 1406 10/06/16 2013 10/07/16 0100 10/07/16 0629  BP: 118/70 (!) 146/81 (!) 147/98 (!) 145/97  Pulse: 79 84 86 80  Resp: '19 18 17 18  '$ Temp: 97.9 F (36.6 C) 98.5 F (36.9 C) 98.2 F (36.8 C) 98.2 F (36.8 C)  TempSrc: Oral Oral Oral Oral  SpO2:  97%  95%  Weight:    61.1 kg (134 lb 12.8 oz)  Height:        Intake/Output Summary (Last 24 hours) at 10/07/16 1539 Last data filed at 10/07/16 0630  Gross  per 24 hour  Intake              300 ml  Output              500 ml  Net             -200 ml   Filed Weights   10/05/16 0300 10/06/16 0442 10/07/16 0629  Weight: 64 kg (141 lb 3.2 oz) 61.3 kg (135 lb 3.2 oz) 61.1 kg (134 lb 12.8 oz)   General: Alert, Awake and Oriented to Time, Place and Person. Appear in moderate distress, affect appropriate Eyes: PERRL, Conjunctiva normal ENT: Oral  Mucosa clear moist. Neck: no JVD, no Abnormal Mass Or lumps Cardiovascular: S1 and S2 Present, no Murmur, Respiratory: Bilateral Air entry equal and Decreased, no use of accessory muscle, Clear to Auscultation, no Crackles, no wheezes Abdomen: Bowel Sound present, Soft and no tenderness Skin: no redness, no Rash, no induration Extremities: bilateral Pedal edema, no calf tenderness Neurologic: Grossly no focal neuro deficit. Bilaterally Equal motor strength  Data Reviewed: CBC:  Recent Labs Lab 10/02/16 1735 10/03/16 0331 10/05/16 1033 10/07/16 0623  WBC 17.0* 16.1* 19.1* 11.7*  NEUTROABS 14.6*  --  16.8*  --   HGB 11.8* 11.9* 13.4 13.5  HCT 38.1 38.1 41.2 41.8  MCV 92.5 92.0 90.2 91.3  PLT 240 218 274 778   Basic Metabolic Panel:  Recent Labs Lab 10/03/16 0331 10/04/16 0422 10/05/16 0437 10/06/16 0512 10/07/16 1116  NA 136 135 137 140 140  K 4.4 4.0 3.2* 3.2* 3.6  CL 104 104 106 107 110  CO2 23 20* 18* 22 21*  GLUCOSE 170* 144* 129* 107* 98  BUN 18 31* 43* 51* 35*  CREATININE 0.98 1.20* 1.43* 1.42* 0.91  CALCIUM 9.0 8.7* 9.0 8.5* 8.3*  MG  --  2.0 2.4  --  2.4    Liver Function Tests:  Recent Labs Lab 10/02/16 1735 10/05/16 1033  AST 25 22  ALT 15 17  ALKPHOS 85 66  BILITOT 1.2 0.6  PROT 7.4 7.7  ALBUMIN 3.9 3.9   No results for input(s): LIPASE, AMYLASE in the last 168 hours. No results for input(s): AMMONIA in the last 168 hours. Coagulation Profile:  Recent Labs Lab 10/03/16 0331 10/04/16 0422 10/05/16 0437 10/06/16 0512 10/07/16 1116  INR 2.32 2.63 3.20 4.35* 4.82*   Cardiac Enzymes:  Recent Labs Lab 10/02/16 1735 10/02/16 2316  TROPONINI 0.04* 0.10*   BNP (last 3 results) No results for input(s): PROBNP in the last 8760 hours. CBG: No results for input(s): GLUCAP in the last 168 hours. Studies: No results found.  Scheduled Meds: . acetaminophen  650 mg Oral TID  . atorvastatin  40 mg Oral QHS  . cephALEXin  500 mg Oral Q12H    . docusate sodium  100 mg Oral BID  . folic acid  2 mg Oral Daily  . levothyroxine  88 mcg Oral QAC breakfast  . magnesium hydroxide  15 mL Oral Daily  . metoCLOPramide  5 mg Oral TID AC  . metoprolol tartrate  25 mg Oral BID  . polyethylene glycol  17 g Oral BID  . spironolactone  25 mg Oral Daily  . Warfarin - Pharmacist Dosing Inpatient   Does not apply q1800   Continuous Infusions: PRN Meds: acetaminophen, ipratropium, LORazepam, ondansetron (ZOFRAN) IV, sodium chloride  Time spent: 35 minutes  Author: Berle Mull, MD [facility] Pager: [phone] 10/07/2016 3:39 PM  If 7PM-7AM, please contact  night-coverage at [email or web address], password [facility]

## 2016-10-07 NOTE — NC FL2 (Signed)
[place] LEVEL OF CARE SCREENING TOOL     IDENTIFICATION  Patient Name: [name] Birthdate: Jul 15, 1923 Sex: female Admission Date (Current Location): 10/02/2016  [facility] and [place] Number:  Herbalist and Address:  [facility] [place] [facility] [address] [place], [place] [address]      Provider Number: [id number]  Attending Physician Name and Address:  Lavina Hamman, MD  Relative Name and Phone Number:       Current Level of Care: Hospital Recommended Level of Care: [place] Prior Approval Number:    Date Approved/Denied:   PASRR Number: [id number]  Discharge Plan: SNF    Current Diagnoses: Patient Active Problem List   Diagnosis Date Noted  . Pressure injury of skin 10/03/2016  . Chronic atrial fibrillation with rapid ventricular response ([place]) 10/02/2016  . Allergy to sulfa drugs 10/02/2016  . Atrial fibrillation with RVR ([place]) 10/02/2016  . Long term current use of anticoagulant therapy 05/12/2015  . History of fracture of right hip 04/15/2015  . Senile purpura ([place]) 04/13/2015  . Angioimmunoblastic lymphoma ([place]) 04/05/2015  . PAF (paroxysmal atrial fibrillation) ([place])   . Arthritis 09/13/2014  . Intermittent LBBB 08/16/2014  . Drug-induced bradycardia 02/20/2014  . Prolonged Q-T interval on ECG 11/02/2012  . Hypotension 11/01/2012  . Acute respiratory failure with hypoxia ([place]) 11/01/2012  . Aortic valvular stenosis, moderate to severe 06/24/2011  . Acute on chronic diastolic CHF (congestive heart failure) ([place]) 06/21/2011  . Hyperlipidemia 06/21/2011  . Hypertensive heart disease with CHF (congestive heart failure) ([place]) 06/21/2011  . Hypothyroidism, goitrous 06/21/2011  . Coronary artery disease 06/21/2011    Orientation RESPIRATION BLADDER Height & Weight     Self, Time, Situation, Place  Normal Continent Weight: 134 lb 12.8 oz (61.1 kg) Height:  '5\' 5"'$  (165.1 cm)  BEHAVIORAL  SYMPTOMS/MOOD NEUROLOGICAL BOWEL NUTRITION STATUS      Continent  (Please see d/c summary)  AMBULATORY STATUS COMMUNICATION OF NEEDS Skin     Verbally PU Stage and Appropriate Care PU Stage 1 Dressing: No Dressing (Right Buttocks)                     Personal Care Assistance Level of Assistance              Functional Limitations Info  Sight, Hearing, Speech Sight Info: Adequate Hearing Info: Adequate Speech Info: Adequate    SPECIAL CARE FACTORS FREQUENCY  PT (By licensed PT), OT (By licensed OT)     PT Frequency: 3x week OT Frequency: 3x week            Contractures Contractures Info: Not present    Additional Factors Info  Code Status, Allergies Code Status Info: Full Code Allergies Info: [name] Ethacrynic Acid, Tape, Furosemide, Sulfa Antibiotics, [name], Torsemide           Current Medications (10/07/2016):  This is the current hospital active medication list Current Facility-Administered Medications  Medication Dose Route Frequency Provider Last Rate Last Dose  . acetaminophen (TYLENOL) tablet 650 mg  650 mg Oral TID Norval Morton, MD   650 mg at 10/07/16 0817  . acetaminophen (TYLENOL) tablet 650 mg  650 mg Oral Q4H PRN Smith, Rondell A, MD      . atorvastatin (LIPITOR) tablet 40 mg  40 mg Oral QHS Smith, Rondell A, MD   40 mg at 10/06/16 2138  . docusate sodium (COLACE) capsule 100 mg  100 mg  Oral BID Lavina Hamman, MD   100 mg at 10/07/16 0818  . folic acid (FOLVITE) tablet 2 mg  2 mg Oral Daily Fuller Plan A, MD   2 mg at 10/07/16 0819  . ipratropium (ATROVENT) nebulizer solution 0.5 mg  0.5 mg Nebulization Q4H PRN Mesner, Corene Cornea, MD      . levothyroxine (SYNTHROID, LEVOTHROID) tablet 88 mcg  88 mcg Oral QAC breakfast Fuller Plan A, MD   88 mcg at 10/07/16 0818  . LORazepam (ATIVAN) tablet 0.5 mg  0.5 mg Oral Q12H PRN Lavina Hamman, MD   0.5 mg at 10/03/16 1651  . magnesium hydroxide (MILK OF MAGNESIA) suspension 15 mL   15 mL Oral Daily Lavina Hamman, MD   15 mL at 10/07/16 0818  . metoprolol tartrate (LOPRESSOR) tablet 25 mg  25 mg Oral BID Bensimhon, Shaune Pascal, MD   25 mg at 10/07/16 0817  . ondansetron (ZOFRAN) injection 4 mg  4 mg Intravenous Q12H PRN Dorothy Spark, MD   4 mg at 10/07/16 1042  . polyethylene glycol (MIRALAX / GLYCOLAX) packet 17 g  17 g Oral BID Lavina Hamman, MD   17 g at 10/07/16 0818  . sodium chloride (OCEAN) 0.65 % nasal spray 1 spray  1 spray Each Nare PRN Lavina Hamman, MD   1 spray at 10/03/16 1655  . spironolactone (ALDACTONE) tablet 25 mg  25 mg Oral Daily Jettie Booze E, NP   25 mg at 10/07/16 0819  . Warfarin - Pharmacist Dosing Inpatient   Does not apply q1800 [facility]         Discharge Medications: Please see discharge summary for a list of discharge medications.  Relevant Imaging Results:  Relevant Lab Results:   Additional Information SSN: [id number]  Eileen Stanford, LCSW

## 2016-10-07 NOTE — Progress Notes (Signed)
PT Cancellation Note  Patient Details Name: [name] MRN: [id number] DOB: 03-05-1924   Cancelled Treatment:    Reason Eval/Treat Not Completed: Medical issues which prohibited therapy. Checked on pt twice and both times she was nauseous and extremely fatigued as well as reporting she almost passed out when on [facility]. Will hold therapy today and check back tomorrow.    [place] 10/07/2016, 12:08 PM

## 2016-10-08 LAB — CBC
HCT: 37.9 % (ref 36.0–46.0)
Hemoglobin: 11.9 g/dL — ABNORMAL LOW (ref 12.0–15.0)
MCH: 28.5 pg (ref 26.0–34.0)
MCHC: 31.4 g/dL (ref 30.0–36.0)
MCV: 90.9 fL (ref 78.0–100.0)
Platelets: 255 10*3/uL (ref 150–400)
RBC: 4.17 MIL/uL (ref 3.87–5.11)
RDW: 15.1 % (ref 11.5–15.5)
WBC: 10.6 10*3/uL — AB (ref 4.0–10.5)

## 2016-10-08 LAB — BASIC METABOLIC PANEL
Anion gap: 7 (ref 5–15)
BUN: 30 mg/dL — AB (ref 6–20)
CO2: 25 mmol/L (ref 22–32)
CREATININE: 0.95 mg/dL (ref 0.44–1.00)
Calcium: 8.2 mg/dL — ABNORMAL LOW (ref 8.9–10.3)
Chloride: 108 mmol/L (ref 101–111)
GFR calc Af Amer: 59 mL/min — ABNORMAL LOW (ref >60)
GFR, EST NON AFRICAN AMERICAN: 51 mL/min — AB (ref >60)
Glucose, Bld: 90 mg/dL (ref 65–99)
Potassium: 3.5 mmol/L (ref 3.5–5.1)
SODIUM: 140 mmol/L (ref 135–145)

## 2016-10-08 LAB — URINE CULTURE: Culture: >=100000 — AB

## 2016-10-08 LAB — PROTIME-INR
INR: 4.31 — AB
Prothrombin Time: 42.5 seconds — ABNORMAL HIGH (ref 11.4–15.2)

## 2016-10-08 MED ORDER — METOCLOPRAMIDE HCL 5 MG PO TABS: 5.0000 mg | ORAL_TABLET | Freq: Three times a day (TID) | ORAL | 0 refills | Status: DC

## 2016-10-08 MED ORDER — LEVOTHYROXINE SODIUM 88 MCG PO TABS: 88.0000 ug | ORAL_TABLET | Freq: Every day | ORAL | 0 refills | Status: DC

## 2016-10-08 MED ORDER — DOCUSATE SODIUM 100 MG PO CAPS: 100.0000 mg | ORAL_CAPSULE | Freq: Two times a day (BID) | ORAL | 0 refills | Status: DC

## 2016-10-08 MED ORDER — CEPHALEXIN 500 MG PO CAPS: 500.0000 mg | ORAL_CAPSULE | Freq: Two times a day (BID) | ORAL | 0 refills | Status: AC

## 2016-10-08 MED ORDER — POLYETHYLENE GLYCOL 3350 17 G PO PACK: 17.0000 g | PACK | Freq: Every day | ORAL | 0 refills | Status: DC

## 2016-10-08 MED ORDER — MAGNESIUM HYDROXIDE 400 MG/5ML PO SUSP: 15.0000 mL | Freq: Every day | ORAL | 0 refills | Status: DC | PRN

## 2016-10-08 NOTE — Discharge Summary (Addendum)
[facility] Discharge Summary   Patient: [name] UEA:[id number]   PCP: [name], MD DOB: [birth year]   Date of admission: 10/02/2016   Date of discharge:  10/08/2016    Discharge Diagnoses:  Principal Problem:   Atrial fibrillation with RVR ([place]) Active Problems:   Hyperlipidemia   Hypertensive heart disease with CHF (congestive heart failure) (HCC)   Hypothyroidism, goitrous   Coronary artery disease   Aortic valvular stenosis, moderate to severe   Hypotension   Long term current use of anticoagulant therapy   Chronic atrial fibrillation with rapid ventricular response (HCC)   Allergy to sulfa drugs   Pressure injury of skin   Admitted From: home Disposition:  SNF  Recommendations for Outpatient Follow-up:  1. Please follow up with PCP and cardiology as recommended     Contact information for follow-up providers    [name], MD. Schedule an appointment as soon as possible for a visit in 1 week(s).   Specialty:  Family Medicine Contact information: [place] [address] [phone]        Croitoru, Dani Gobble, MD. Schedule an appointment as soon as possible for a visit in 2 week(s).   Specialty:  Cardiology Why:  TCM follow up.  Contact information: [address] [place] [address] [phone]            Contact information for after-discharge care    [facility] .   Specialty:  [place] information: [id number] [place] [phone]                 Diet recommendation: soft diet for 1 week, advance as tolerated  Activity: The patient is advised to gradually reintroduce usual activities.  Discharge Condition: good  Code Status: full code  History of present illness: As per the H and P dictated on admission, "[name] is a 83 y.o. female with medical history significant of  Chronic dCHF, chronic A. fib on anticoagulation, mod AS, HTN, CAD s/p PCI 2010, hypothyroidism; who presented with acute onset of shortness of breath. At baseline the patient reports not being on oxygen at home and had been taking all of her medications as prescribed. While sitting at home she reports feeling her heat racing. Shortly thereafter she began to feel short of breath. Denies trying anything to relieve symptoms. She called out to her husband stating that she did not feel well. He checked her blood pressure and possibly her pulse was noted to be in the 150s and he immediately called EMS. She denies having any chest pain, significant leg swelling, change in weight, dysuria, or recent sick contacts."  Hospital Course:  Summary of her active problems in the hospital is as following. Paroxysmal atrialfibrillation on chronic anticoagulation Acute on chronic diastolic dysfunction Patient presented in A. fib with RVR with heart rates into the 160s. Patient was electrically cardioverted at least 4 times all in the ED, but remained in persistent A. fib.  Cardiology consulted and pt was placed on amiodarone drip. Coumadin and per pharmacy. Amiodarone drip stopped now due to GI symptoms. Echocardiogram suggest no new changes and preserved EF Appreciate cardiology consultative services.  Diuresed with ethacrynic acid, now stopped  And only on aldactone.   Elevated cardiac troponin- demand ischemia  secondary to supply demand with patient having rapid atrial fibrillation. Subtle ST elevations on coumadin therapeutic INR,  No ischemic  work up recommended  Colonic ileus without any bowel obstruction. Nausea and vomiting Tolerating soft diet, continue when necessary Zofran, continue MiraLAX.  Continue scheduled reglan  Change Senokot and Colace.  Ambulate every 4 hour, out of bed 3 times a day.  E coli UTI Urine culture is growing Escherichia coli. The patient does not have any fever she had  leukocytosis and is feeling lethargic for last few days. I would treat her with 3 days of Keflex.  CAD s/p PCI:  Patient underwent placement of bare-metal stent to RCA in 2010.   Anemia: Hemoglobin stable - Continue to monitor  Hyperlipidemia - Continue atorvastatin  Hypothyroidism - mildly elevated TSH, check free t4.  - Continue Levothyroxine  Hypomagnesemia  Replaced and now stable    Pressure injury  Stage I right buttock  Present on admission Continue foam dressing.   All other chronic medical condition were stable during the hospitalization.  Patient was seen by physical therapy, who recommended SNF, which was arranged by Education officer, museum and case Freight forwarder. On the day of the discharge the patient's vitals were stable, and no other acute medical condition were reported by patient. the patient was felt safe to be discharge at SNF with therapy.  Procedures and Results:  Echocardiogram   Cardioversion failed.    Consultations:  Cardiology  DISCHARGE MEDICATION: Current Discharge Medication List    START taking these medications   Details  cephALEXin (KEFLEX) 500 MG capsule Take 1 capsule (500 mg total) by mouth every 12 (twelve) hours. Qty: 6 capsule, Refills: 0    docusate sodium (COLACE) 100 MG capsule Take 1 capsule (100 mg total) by mouth 2 (two) times daily. Qty: 10 capsule, Refills: 0    magnesium hydroxide (MILK OF MAGNESIA) 400 MG/5ML suspension Take 15 mLs by mouth daily as needed for mild constipation. Qty: 360 mL, Refills: 0    metoCLOPramide (REGLAN) 5 MG tablet Take 1 tablet (5 mg total) by mouth 3 (three) times daily before meals. Qty: 12 tablet, Refills: 0    polyethylene glycol (MIRALAX / GLYCOLAX) packet Take 17 g by mouth daily. Qty: 14 each, Refills: 0      CONTINUE these medications which have CHANGED   Details  levothyroxine (SYNTHROID, LEVOTHROID) 88 MCG tablet Take 1 tablet (88 mcg total) by mouth daily before breakfast. Qty: 30  tablet, Refills: 0      CONTINUE these medications which have NOT CHANGED   Details  acetaminophen (TYLENOL) 650 MG CR tablet Take 650 mg by mouth 3 (three) times daily. Morning, supper and bedtime    atorvastatin (LIPITOR) 40 MG tablet TAKE 1 TABLET DAILY Qty: 90 tablet, Refills: 3    folic acid (FOLVITE) 1 MG tablet TAKE 2 TABLETS DAILY Qty: 180 tablet, Refills: 3    loratadine (CLARITIN) 10 MG tablet Take 10 mg by mouth daily.    metoprolol tartrate (LOPRESSOR) 25 MG tablet Take 1 tablet (25 mg total) by mouth 2 (two) times daily. Qty: 180 tablet, Refills: 3    Multiple Vitamin (MULTIVITAMIN WITH MINERALS) TABS tablet Take 1 tablet by mouth daily. Centrum Silver    spironolactone (ALDACTONE) 25 MG tablet TAKE 1 TABLET DAILY Qty: 90 tablet, Refills: 3    warfarin (COUMADIN) 2.5 MG tablet Take 1 tablet by mouth daily or as directed by coumadin clinic Qty: 90 tablet, Refills: 1    ezetimibe (ZETIA) 10 MG tablet TAKE 1 TABLET DAILY (MAKE APPOINTMENT FOR FUTURE REFILLS) Qty: 90 tablet, Refills: 3  ipratropium (ATROVENT) 0.03 % nasal spray Place 2 sprays into both nostrils 2 (two) times daily. Use as directed Qty: 30 mL, Refills: 5       Allergies  Allergen Reactions  . [name] [Ethacrynic Acid] Nausea And Vomiting  . Tape Other (See Comments)    Tears skin off.  Please use "paper" tape only.  . Furosemide Rash  . Sulfa Antibiotics Itching and Rash  . [name] Rash    Per Dr Sallyanne Kuster, had a skin reaction with thiazides prior to use of Lasix  . Torsemide Rash   Discharge Instructions    Diet - low sodium heart healthy    Complete by:  As directed    Discharge instructions    Complete by:  As directed    It is important that you read following instructions as well as go over your medication list with RN to help you understand your care after this hospitalization.  Discharge Instructions: Please follow-up with PCP in one week  Please request your  primary care physician to go over all Hospital Tests and Procedure/Radiological results at the follow up,  Please get all Hospital records sent to your PCP by signing hospital release before you go home.   Do not take more than prescribed Pain, Sleep and Anxiety Medications. You were cared for by a hospitalist during your hospital stay. If you have any questions about your discharge medications or the care you received while you were in the hospital after you are discharged, you can call the unit and ask to speak with the hospitalist on call if the hospitalist that took care of you is not available.  Once you are discharged, your primary care physician will handle any further medical issues. Please note that NO REFILLS for any discharge medications will be authorized once you are discharged, as it is imperative that you return to your primary care physician (or establish a relationship with a primary care physician if you do not have one) for your aftercare needs so that they can reassess your need for medications and monitor your lab values. You Must read complete instructions/literature along with all the possible adverse reactions/side effects for all the Medicines you take and that have been prescribed to you. Take any new Medicines after you have completely understood and accept all the possible adverse reactions/side effects. Wear Seat belts while driving.   Increase activity slowly    Complete by:  As directed      Discharge Exam: Filed Weights   10/05/16 0300 10/06/16 0442 10/07/16 0629  Weight: 64 kg (141 lb 3.2 oz) 61.3 kg (135 lb 3.2 oz) 61.1 kg (134 lb 12.8 oz)   Vitals:   10/08/16 0500 10/08/16 0943  BP: 97/61   Pulse: 75 79  Resp: 16   Temp: 97.6 F (36.4 C)    General: Appear in no distress, no Rash; Oral Mucosa moist. Cardiovascular: S1 and S2 Present, no Murmur, no JVD Respiratory: Bilateral Air entry present and Clear to Auscultation, no Crackles, no wheezes Abdomen:  Bowel Sound present, Soft and no tenderness Extremities: trace Pedal edema, no calf tenderness Neurology: Grossly no focal neuro deficit.  The results of significant diagnostics from this hospitalization (including imaging, microbiology, ancillary and laboratory) are listed below for reference.    Significant Diagnostic Studies: Ct Abdomen Pelvis Wo Contrast  Result Date: 10/05/2016 CLINICAL DATA:  Abdominal pain.  Inpatient. EXAM: CT ABDOMEN AND PELVIS WITHOUT CONTRAST TECHNIQUE: Multidetector CT imaging of the abdomen and pelvis was  performed following the standard protocol without IV contrast. COMPARISON:  Abdominal radiograph 1 day prior. 07/06/2015 CT abdomen/pelvis. FINDINGS: Lower chest: Small left and trace right dependent pleural effusions with associated mild dependent compressive left lower lobe atelectasis. Three-vessel coronary atherosclerosis. Tiny sub 5 mm pulmonary nodules at the lung bases are unchanged since 07/06/2015 and considered benign. Hepatobiliary: Normal liver with no liver mass. Cholelithiasis. No gallbladder wall thickening or pericholecystic fluid. No biliary ductal dilatation. Pancreas: Normal, with no mass or duct dilation. Spleen: Normal size. No mass. Adrenals/Urinary Tract: Normal adrenals. Mild fullness of the central right renal collecting system without overt right hydronephrosis. No left hydronephrosis. No renal stones. No contour deforming renal mass. Limited bladder visualization due to streak artifact, with no gross bladder abnormality. Stomach/Bowel: Grossly normal stomach. Normal caliber small bowel with no small bowel wall thickening. Appendectomy. There is diffuse colonic distention with gas and air-fluid levels, with no large bowel wall thickening or pericolonic fat stranding. No focal large bowel caliber transition. Vascular/Lymphatic: Atherosclerotic nonaneurysmal abdominal aorta. No pathologically enlarged lymph nodes in the abdomen or pelvis. Reproductive:  Grossly normal uterus.  No adnexal mass. Other: No pneumoperitoneum, ascites or focal fluid collection. Musculoskeletal: No aggressive appearing focal osseous lesions. Partially visualized right total hip arthroplasty. Healed deformity in the right inferior pubic ramus. Moderate thoracolumbar spondylosis. IMPRESSION: 1. Colonic ileus.  No evidence of bowel obstruction . 2. Small left and trace right dependent pleural effusions. 3. Cholelithiasis. no evidence of acute cholecystitis. No biliary ductal dilatation. 4. Aortic atherosclerosis.  Three-vessel coronary atherosclerosis. Electronically Signed   By: Ilona Sorrel M.D.   On: 10/05/2016 16:23   Dg Chest Portable 1 View  Result Date: 10/02/2016 CLINICAL DATA:  Irregular heartbeat, history CHF, hypertension, atrial fibrillation, coronary artery disease EXAM: PORTABLE CHEST 1 VIEW COMPARISON:  Portable exam 1721 hours compared to 04/15/2015 FINDINGS: External pacing leads project over chest. Enlargement of cardiac silhouette with pulmonary vascular congestion. Atherosclerotic calcification aorta. Mild perihilar infiltrates likely representing pulmonary edema. Underlying emphysematous changes. No definite pleural effusion or pneumothorax. Bones demineralized with note of a LEFT shoulder prosthesis. IMPRESSION: Question mild CHF. Electronically Signed   By: Lavonia Dana M.D.   On: 10/02/2016 17:32   Dg Abd Portable 1v  Result Date: 10/04/2016 CLINICAL DATA:  Mid abdominal pain since yesterday. EXAM: PORTABLE ABDOMEN - 1 VIEW COMPARISON:  Abdominal CT 07/06/2015 FINDINGS: No evidence of bowel dilatation to suggest obstruction. Air-filled bowel in the pelvis and left abdomen felt to tortuous sigmoid. Moderate stool in the cecum and ascending colon. Ingested material in the stomach versus stool in the transverse colon in the left upper quadrant. No evidence of free air on single view. Ovoid calcification in the right lower quadrant is in the pericolonic fat on prior  CT. Calcified gallstone on prior CT is tentatively identified in the right upper quadrant. The bones appear under mineralized. Right hip prosthesis partially included. IMPRESSION: No evidence of bowel obstruction on portable supine view. Tortuous sigmoid colon with moderate stool in the ascending colon. Ingested material versus stool in the transverse colon. Known cholelithiasis tentatively identified in the right upper quadrant. Electronically Signed   By: Jeb Levering M.D.   On: 10/04/2016 16:03    Microbiology: Recent Results (from the past 240 hour(s))  MRSA PCR Screening     Status: None   Collection Time: 10/02/16 11:05 PM  Result Value Ref Range Status   MRSA by PCR NEGATIVE NEGATIVE Final    Comment:  The GeneXpert MRSA Assay (FDA approved for NASAL specimens only), is one component of a comprehensive MRSA colonization surveillance program. It is not intended to diagnose MRSA infection nor to guide or monitor treatment for MRSA infections.   Culture, blood (routine x 2)     Status: None (Preliminary result)   Collection Time: 10/05/16 12:19 PM  Result Value Ref Range Status   Specimen Description BLOOD LEFT HAND  Final   Special Requests   Final    Blood Culture results may not be optimal due to an inadequate volume of blood received in culture bottles   Culture NO GROWTH 3 DAYS  Final   Report Status PENDING  Incomplete  Culture, blood (routine x 2)     Status: None (Preliminary result)   Collection Time: 10/05/16 12:19 PM  Result Value Ref Range Status   Specimen Description BLOOD LEFT HAND  Final   Special Requests   Final    Blood Culture results may not be optimal due to an inadequate volume of blood received in culture bottles   Culture NO GROWTH 3 DAYS  Final   Report Status PENDING  Incomplete  Culture, Urine     Status: Abnormal   Collection Time: 10/05/16  8:19 PM  Result Value Ref Range Status   Specimen Description URINE, RANDOM  Final   Special  Requests NONE  Final   Culture >=100,000 COLONIES/mL ESCHERICHIA COLI (A)  Final   Report Status 10/08/2016 FINAL  Final   Organism ID, Bacteria ESCHERICHIA COLI (A)  Final      Susceptibility   Escherichia coli - MIC*    AMPICILLIN 8 SENSITIVE Sensitive     CEFAZOLIN <=4 SENSITIVE Sensitive     CEFTRIAXONE <=1 SENSITIVE Sensitive     CIPROFLOXACIN <=0.25 SENSITIVE Sensitive     GENTAMICIN <=1 SENSITIVE Sensitive     IMIPENEM <=0.25 SENSITIVE Sensitive     NITROFURANTOIN <=16 SENSITIVE Sensitive     TRIMETH/SULFA <=20 SENSITIVE Sensitive     AMPICILLIN/SULBACTAM 4 SENSITIVE Sensitive     PIP/TAZO <=4 SENSITIVE Sensitive     Extended ESBL NEGATIVE Sensitive     * >=100,000 COLONIES/mL ESCHERICHIA COLI     Labs: CBC:  Recent Labs Lab 10/02/16 1735 10/03/16 0331 10/05/16 1033 10/07/16 0623 10/08/16 0453  WBC 17.0* 16.1* 19.1* 11.7* 10.6*  NEUTROABS 14.6*  --  16.8*  --   --   HGB 11.8* 11.9* 13.4 13.5 11.9*  HCT 38.1 38.1 41.2 41.8 37.9  MCV 92.5 92.0 90.2 91.3 90.9  PLT 240 218 274 210 914   Basic Metabolic Panel:  Recent Labs Lab 10/04/16 0422 10/05/16 0437 10/06/16 0512 10/07/16 1116 10/08/16 0453  NA 135 137 140 140 140  K 4.0 3.2* 3.2* 3.6 3.5  CL 104 106 107 110 108  CO2 20* 18* 22 21* 25  GLUCOSE 144* 129* 107* 98 90  BUN 31* 43* 51* 35* 30*  CREATININE 1.20* 1.43* 1.42* 0.91 0.95  CALCIUM 8.7* 9.0 8.5* 8.3* 8.2*  MG 2.0 2.4  --  2.4  --    Liver Function Tests:  Recent Labs Lab 10/02/16 1735 10/05/16 1033  AST 25 22  ALT 15 17  ALKPHOS 85 66  BILITOT 1.2 0.6  PROT 7.4 7.7  ALBUMIN 3.9 3.9   No results for input(s): LIPASE, AMYLASE in the last 168 hours. No results for input(s): AMMONIA in the last 168 hours. Cardiac Enzymes:  Recent Labs Lab 10/02/16 1735 10/02/16 2316  TROPONINI 0.04*  0.10*   BNP (last 3 results)  Recent Labs  10/02/16 1735  BNP 317.8*   CBG: No results for input(s): GLUCAP in the last 168 hours. Time  spent: 30 minutes  Signed:  Jyla Hopf  [facility]  10/08/2016  , 11:44 AM

## 2016-10-08 NOTE — Clinical Social Work Note (Signed)
Clinical Social Worker facilitated patient discharge including contacting patient family and facility to confirm patient discharge plans.  Clinical information faxed to facility and family agreeable with plan.  CSW arranged ambulance transport via [facility] to [facility] .  RN to call [phone] for report prior to discharge.  Clinical Social Worker will sign off for now as social work intervention is no longer needed. Please consult [place] again if new need arises.  [place], [place]

## 2016-10-08 NOTE — Care Management Note (Signed)
Case Management Note  Patient Details  Name: [name] MRN: [id number] Date of Birth: 01-28-1924  Subjective/Objective:  Late Entry: Pt presented for Acute on Chronic CHF- found to be in A fib RVR. Initiated on Cardizem gtt initially. PT recommendations for SNF. Pt d/c to [facility]. CSW assisted with disposition needs.    Action/Plan: No further needs from CM at this time.   Expected Discharge Date:  10/08/16               Expected Discharge Plan:  Skilled Nursing Facility  In-House Referral:  Clinical Social Work  Discharge planning Services  CM Consult  Post Acute Care Choice:  NA Choice offered to:  NA  DME Arranged:  N/A DME Agency:  NA  HH Arranged:  NA HH Agency:  NA  Status of Service:  Completed, signed off  If discussed at [employer] of Stay Meetings, dates discussed:    Additional Comments:  Bethena Roys, RN 10/08/2016, 2:24 PM

## 2016-10-10 LAB — CULTURE, BLOOD (ROUTINE X 2)
CULTURE: NO GROWTH
CULTURE: NO GROWTH

## 2016-10-23 NOTE — Progress Notes (Signed)
Cardiology Office Note    Date:  10/24/2016   ID:  [name], DOB 04-Jun-1923, MRN [id number]  PCP:  [name], MD  Cardiologist: Dr. Sallyanne Kuster   Chief Complaint  Patient presents with  . Follow-up    History of Present Illness:    [name] is a 83 y.o. female with past medical history of CAD (s/p BMS to RCA in 2010), chronic diastolic CHF, persistent atrial fibrillation (on Coumadin), and moderate AS who presents to the office today for hospital follow-up.   She was recently admitted from 10/02/2016 -  10/08/2016 for atrial fibrillation with RVR and a CHF exacerbation. She reported allergies to diuretics with a severe rash with Lasix and Torsemide. She was initially to be started on IV Ethacrynic acid but this was not available, therefore she was started on PO Ethacrynic acid. Was initially placed on IV Amiodarone in the setting of her elevated HR but was unable to tolerate this secondary to nausea, therefore she was started on Lopressor 25mg  BID. Weight was improved to 134 lbs at the time of discharge (down from 151 lbs on admission) with a stable creatinine of 0.95. Dr. Clayborne Dana last office note said she "would likely need low-dose ethacrynic acid on discharge. Consider 25 mg on Monday and Friday + prn". I cannot see where this was prescribed at the time of discharge.    In talking with the patient today, she reports overall doing well since her recent hospitalization. She was just discharged from rehabilitation yesterday. Reports walking over 300 feet with physical therapy without any chest discomfort or dyspnea on exertion. She denies any orthopnea or PND. Reports mild lower extremity edema. Says weights have been between 136-138 lbs since returning home.  She has continued on Lopressor 25mg  BID and Spironolactone 25mg  daily. She has not resumed Ethacrynic acid. She remains on Coumadin and denies any evidence of active bleeding.    Past Medical History:  Diagnosis  Date  .  Acute respiratiory failure requiring BiPap 06/24/2011  . Allergy   . Aortic valvular stenosis, moderate to severe 06/24/2011   a. 10/2012 Echo: mod AS, Valve 1.25 cm^2 (VTI), 1.19cm^2 (Vmax).  . Arthritis    "back" (10/30/2012)  . Chronic diastolic CHF (congestive heart failure) ([place])    a. 10/2012 Echo: EF 55-60%, no rwma, Gr 2 DD, moderate AS, mod dil LA, mildly to mod dil RA.  Marland Kitchen Coronary artery disease    a. 03/2009 PCI RCA (3.0x18 BMS).  . Dyslipidemia   . Exertional shortness of breath   . Hypertension   . Hypothyroidism   . PAF (paroxysmal atrial fibrillation) ([place])    a. CHA2DS2VASc = 7-->chronic coumadin;  b. s/p DCCV 05/2011, 05/2012, 07/2014;  c. On amio.  . Rash    a. felt to be 2/2 lasix/torsemide in setting of sulfa allergy (lasix d/c ~ 06/2014, torsemide d/c 08/09/2014).    Past Surgical History:  Procedure Laterality Date  . ANGIOPLASTY    . APPENDECTOMY    . BACK SURGERY    . CARDIAC CATHETERIZATION  10/06/2009   Continue medical therapy  . CARDIAC CATHETERIZATION  04/07/2009   RCA stented with a 3x2mm stent resulting in a reduction of 90% narrowing to normal  . CARDIOVERSION  06/19/2011   Procedure: CARDIOVERSION;  Surgeon: Sanda Klein, MD;  Location: [facility];  Service: Cardiovascular;  Laterality: N/A;  . CARDIOVERSION  07/11/2010   Successful coversion to sinus rhythm  . CARDIOVERSION N/A 08/10/2014  Procedure: CARDIOVERSION;  Surgeon: Sanda Klein, MD;  Location: [facility];  Service: Cardiovascular;  Laterality: N/A;  . CATARACT EXTRACTION W/ INTRAOCULAR LENS  IMPLANT, BILATERAL    . CORONARY ANGIOPLASTY WITH STENT PLACEMENT     "1" (10/30/2012)  . DILATION AND CURETTAGE OF UTERUS    . EXCISIONAL HEMORRHOIDECTOMY    . FRACTURE SURGERY    . HIP ARTHROPLASTY Right 04/17/2015   Procedure: HIP MONOPOLAR HIP HEMI ARTHROPLASTY;  Surgeon: Marybelle Killings, MD;  Location: [facility];  Service: Orthopedics;  Laterality: Right;  . LUMBAR DISC SURGERY  X 2   "cleaned out  scar tissue"   . SHOULDER ACROMIOPLASTY Left    "fell; put a new ball in" (10/30/2012)  . WRIST FRACTURE SURGERY Left     Current Medications: Outpatient Medications Prior to Visit  Medication Sig Dispense Refill  . acetaminophen (TYLENOL) 650 MG CR tablet Take 650 mg by mouth 3 (three) times daily. Morning, supper and bedtime    . atorvastatin (LIPITOR) 40 MG tablet TAKE 1 TABLET DAILY (Patient taking differently: TAKE 1 TABLET at bedtime) 90 tablet 3  . docusate sodium (COLACE) 100 MG capsule Take 1 capsule (100 mg total) by mouth 2 (two) times daily. 10 capsule 0  . ezetimibe (ZETIA) 10 MG tablet TAKE 1 TABLET DAILY (MAKE APPOINTMENT FOR FUTURE REFILLS) 90 tablet 3  . folic acid (FOLVITE) 1 MG tablet TAKE 2 TABLETS DAILY 180 tablet 3  . ipratropium (ATROVENT) 0.03 % nasal spray Place 2 sprays into both nostrils 2 (two) times daily. Use as directed 30 mL 5  . levothyroxine (SYNTHROID, LEVOTHROID) 88 MCG tablet Take 1 tablet (88 mcg total) by mouth daily before breakfast. 30 tablet 0  . loratadine (CLARITIN) 10 MG tablet Take 10 mg by mouth daily.    . magnesium hydroxide (MILK OF MAGNESIA) 400 MG/5ML suspension Take 15 mLs by mouth daily as needed for mild constipation. 360 mL 0  . metoprolol tartrate (LOPRESSOR) 25 MG tablet Take 1 tablet (25 mg total) by mouth 2 (two) times daily. 180 tablet 3  . Multiple Vitamin (MULTIVITAMIN WITH MINERALS) TABS tablet Take 1 tablet by mouth daily. Centrum Silver    . polyethylene glycol (MIRALAX / GLYCOLAX) packet Take 17 g by mouth daily. 14 each 0  . spironolactone (ALDACTONE) 25 MG tablet TAKE 1 TABLET DAILY 90 tablet 3  . warfarin (COUMADIN) 2.5 MG tablet Take 1 tablet by mouth daily or as directed by coumadin clinic 90 tablet 1  . metoCLOPramide (REGLAN) 5 MG tablet Take 1 tablet (5 mg total) by mouth 3 (three) times daily before meals. 12 tablet 0   No facility-administered medications prior to visit.      Allergies:   [name] [ethacrynic acid];  Tape; Furosemide; Sulfa antibiotics; [name]; and Torsemide   Social History   Social History  . Marital status: Married    Spouse name: N/A  . Number of children: 1  . Years of education: N/A   Occupational History  . Retired [employer]   Social History Main Topics  . Smoking status: Never Smoker  . Smokeless tobacco: Never Used  . Alcohol use No  . Drug use: No  . Sexual activity: No   Other Topics Concern  . None   Social History Narrative   Lives in [place], [place]. She got married in 1948.     Family History:  The patient's family history includes Alcohol abuse in her child; Cancer in her son; Cancer (age  of onset: 62) in her mother; Heart attack in her father; Heart disease in her brother, brother, and father; Heart disease (age of onset: 46) in her mother; Heart failure in her mother; Hypertension in her brother, father, mother, and sister; Hypertension (age of onset: 14) in her child; Lung disease in her brother; Stroke (age of onset: 78) in her mother.   Review of Systems:   Please see the history of present illness.     General:  No chills, fever, night sweats or weight changes. Positive for easy bruising.   Cardiovascular:  No chest pain, dyspnea on exertion, edema, orthopnea, palpitations, paroxysmal nocturnal dyspnea. Dermatological: No rash, lesions/masses Respiratory: No cough, dyspnea Urologic: No hematuria, dysuria Abdominal:   No nausea, vomiting, diarrhea, bright red blood per rectum, melena, or hematemesis Neurologic:  No visual changes, wkns, changes in mental status.  All other systems reviewed and are otherwise negative except as noted above.   Physical Exam:    VS:  BP 116/62   Pulse 88   Ht 5\' 2"  (1.575 m)   Wt 138 lb (62.6 kg)   BMI 25.24 kg/m    General: Well developed, elderly Caucasian female appearing in no acute distress. Head: Normocephalic, atraumatic, sclera non-icteric, no xanthomas, nares are without  discharge.  Neck: No carotid bruits. JVD not elevated.  Lungs: Respirations regular and unlabored, without wheezes or rales.  Heart: Irregularly irregular. No S3 or S4.  No rubs or gallops appreciated. 2/6 SEM along RUSB. Abdomen: Soft, non-tender, non-distended with normoactive bowel sounds. No hepatomegaly. No rebound/guarding. No obvious abdominal masses. Msk:  Strength and tone appear normal for age. No joint deformities or effusions. Extremities: No clubbing or cyanosis. Trace lower extremity edema.  Distal pedal pulses are 2+ bilaterally. Neuro: Alert and oriented X 3. Moves all extremities spontaneously. No focal deficits noted. Psych:  Responds to questions appropriately with a normal affect. Skin: No rashes or lesions noted  Wt Readings from Last 3 Encounters:  10/24/16 138 lb (62.6 kg)  10/07/16 134 lb 12.8 oz (61.1 kg)  08/29/16 144 lb (65.3 kg)     Studies/Labs Reviewed:   EKG:  EKG is ordered today.  The ekg ordered today demonstrates atrial fibrillation, HR 104, with known LBBB.   Recent Labs: 10/02/2016: B Natriuretic Peptide 317.8; TSH 4.760 10/05/2016: ALT 17 10/07/2016: Magnesium 2.4 10/08/2016: BUN 30; Creatinine, Ser 0.95; Hemoglobin 11.9; Platelets 255; Potassium 3.5; Sodium 140   Lipid Panel    Component Value Date/Time   CHOL 156 05/10/2016 0958   TRIG 141 05/10/2016 0958   HDL 40 (L) 05/10/2016 0958   CHOLHDL 3.9 05/10/2016 0958   VLDL 28 05/10/2016 0958   LDLCALC 88 05/10/2016 0958    Additional studies/ records that were reviewed today include:   Echocardiogram: 10/03/2016 Study Conclusions  - Left ventricle: The cavity size was normal. Wall thickness was   increased in a pattern of mild LVH. Systolic function was   vigorous. The estimated ejection fraction was in the range of 65%   to 70%. Wall motion was normal; there were no regional wall   motion abnormalities. - Aortic valve: Valve mobility was restricted. There was moderate   stenosis. Peak  velocity (S): 321 cm/s. - Mitral valve: Calcified annulus. Mildly thickened leaflets .   There was mild regurgitation. - Right atrium: The atrium was mildly dilated. - Tricuspid valve: There was moderate regurgitation. - Pulmonary arteries: Systolic pressure was moderately increased.   PA peak pressure: 60 mm Hg (  S). - Pericardium, extracardiac: A trivial pericardial effusion was   identified posterior to the heart.  Impressions:  - Compared to the prior   Assessment:    1. Chronic diastolic heart failure (HCC)   2. Paroxysmal atrial fibrillation ([place])   3. Long term (current) use of anticoagulants   4. Coronary artery disease involving native coronary artery of native heart without angina pectoris   5. Nonrheumatic aortic valve stenosis      Plan:   In order of problems listed above:  1. Chronic Diastolic CHF - she was recently admitted for atrial fibrillation with RVR and a CHF exacerbation. - repeat echo showed a preserved EF of 65-70%.  - she has trace edema on examination today but lungs are clear. Weight has gone up 4 lbs since hospital discharge (134 lbs to 138 lbs today).  - it was recommended for her to be on Ethacrynic Acid 25mg  on Mondays and Fridays but this was not prescribed at the time of her discharge. With her increasing weight and edema, we will initiate this. - continue with Coreg and Spironolactone.   2. Paroxysmal Atrial Fibrillation/ Use of Long-term Anticoagulation - HR was initially elevated to 104 on initial check, improving into the 80's at rest.  - BP has been soft at times when checked at rehab, therefore will continue on Lopressor 25mg  BID. If BP stabilizes, would like to further titrate her BB to assist with HR control.  - she denies any evidence of active bleeding. Continue with Coumadin for anticoagulation. Scheduled for an INR check today.   3. CAD - s/p BMS to RCA in 2010. - She denies any recent chest discomfort or dyspnea on exertion.  EKG shows a known left bundle-branch block. - continue BB and statin therapy. No ASA secondary to need for Coumadin.   4. Moderate AS - noted on her recent echo earlier this month.  - she denies any recent dyspnea on exertion or presyncope.  - continue to follow.    Medication Adjustments/Labs and Tests Ordered: Current medicines are reviewed at length with the patient today.  Concerns regarding medicines are outlined above.  Medication changes, Labs and Tests ordered today are listed in the Patient Instructions below. Patient Instructions  Medication Instructions:  START ETHACRYNIC ACID 25 ON MONDAYS AND FRIDAYS ONLY  If you need a refill on your cardiac medications before your next appointment, please call your pharmacy.  Follow-Up: Your physician wants you to follow-up in: 2-3 [place] PA-C, IF UNAVAILABLE.   Thank you for choosing [facility] at [facility], [address], [place]  10/24/2016 8:17 PM    [address], [address] [place], [place]  [address] Phone: [phone]; Fax: [phone]  [address], [place], [place] [address] Phone: [phone]

## 2016-10-24 ENCOUNTER — Encounter: Payer: Self-pay | Admitting: Student

## 2016-10-24 ENCOUNTER — Ambulatory Visit (INDEPENDENT_AMBULATORY_CARE_PROVIDER_SITE_OTHER): Payer: Medicare Other | Admitting: Student

## 2016-10-24 ENCOUNTER — Ambulatory Visit (INDEPENDENT_AMBULATORY_CARE_PROVIDER_SITE_OTHER): Payer: Medicare Other | Admitting: Pharmacist Clinician (PhC)/ Clinical Pharmacy Specialist

## 2016-10-24 VITALS — BP 116/62 | HR 88 | Ht 62.0 in | Wt 138.0 lb

## 2016-10-24 DIAGNOSIS — Z7901 Long term (current) use of anticoagulants: Secondary | ICD-10-CM | POA: Diagnosis not present

## 2016-10-24 DIAGNOSIS — I480 Paroxysmal atrial fibrillation: Secondary | ICD-10-CM | POA: Diagnosis not present

## 2016-10-24 DIAGNOSIS — I2510 Atherosclerotic heart disease of native coronary artery without angina pectoris: Secondary | ICD-10-CM

## 2016-10-24 DIAGNOSIS — I5032 Chronic diastolic (congestive) heart failure: Secondary | ICD-10-CM | POA: Diagnosis not present

## 2016-10-24 DIAGNOSIS — I350 Nonrheumatic aortic (valve) stenosis: Secondary | ICD-10-CM | POA: Diagnosis not present

## 2016-10-24 LAB — POCT INR: INR: 3.2

## 2016-10-24 MED ORDER — ETHACRYNIC ACID 25 MG PO TABS: 25.0000 mg | ORAL_TABLET | ORAL | 3 refills | Status: DC

## 2016-10-24 NOTE — Patient Instructions (Signed)
Medication Instructions:  START ETHACRYNIC ACID 25 ON MONDAYS AND FRIDAYS ONLY  If you need a refill on your cardiac medications before your next appointment, please call your pharmacy.  Follow-Up: Your physician wants you to follow-up in: 2-3 [place] PA-C, IF UNAVAILABLE.   Thank you for choosing [facility] at [facility]!!

## 2016-10-25 ENCOUNTER — Telehealth: Payer: Self-pay | Admitting: Cardiovascular Disease

## 2016-10-25 NOTE — Telephone Encounter (Signed)
Received a call from PT that is in the patients home. She reports the patient took the ethacrynic acid from a bottle that she had in the past that expired in 2017. Advised for patient to throw that bottle away, go to the pharmacy and get the new prescription, with the correct directions for use and retry taking the medication again after eating on Monday. Okay given for nursing to come into the home to help the family and patient with medications.

## 2016-10-25 NOTE — Progress Notes (Signed)
Thank you, B. I agree with starting the edecrin. Unfortunately I'll be out of the office for the second half of June. Dr. Haroldine Laws and the heart failure clinic should be a good backup for you, if necessary Thanks for taking care of her. MCr

## 2016-10-25 NOTE — Telephone Encounter (Signed)
Spoke with husband he states that pt ate breakfast of cereal  And apple juice and took her medications and about 30 minutes later started vomiting. This has been about 20 minutes. Informed pt husband to give pt 7-up, saltines, etc to "settle stomach" (per Cyril Mourning) and see how she feels, if she feels ok on Monday eat breakfast and take again as ordered. Will discuss with Bernerd Pho, PA-c to see if she has any other suggestions.  Spoke with [place] she concurs with Kristin's suggestions. Pt has been taking this medication in the facility that she was just d/c from and does not think that vomiting is from the ethacrynic acid.  Spoke with pt/nephew notified of suggestions above, she stated that she will not take this again. Upon further discussion w/nephew he will have pt "settle stomach" an keep hydrated over the weekend and eat good breakfast before taking on Monday. He will discuss this with her over the weekend and call back with update on Monday.   S/w Cyril Mourning she states that we can also restart one of the other medication that she has tried in the past furosemide or torsemide and see if pt is willing to try that again when we call her back on Monday see if pt is willing.

## 2016-10-25 NOTE — Telephone Encounter (Signed)
New message   Pt c/o medication issue:  1. Name of Medication: ethacrynic acid ([name]) 25 MG tablet  2. How are you currently taking this medication (dosage and times per day)? 25MG   3. Are you having a reaction (difficulty breathing--STAT)? Throwing up  4. What is your medication issue? Causing pt to throw up

## 2016-10-31 ENCOUNTER — Ambulatory Visit: Payer: Medicare Other | Admitting: Family Medicine

## 2016-11-01 ENCOUNTER — Telehealth: Payer: Self-pay | Admitting: Cardiovascular Disease

## 2016-11-01 NOTE — Telephone Encounter (Signed)
New message       Pt c/o medication issue:  1. Name of Medication:  ethacrynic  2. How are you currently taking this medication (dosage and times per day)?  25mg   3. Are you having a reaction (difficulty breathing--STAT)?  no  4. What is your medication issue?  Pt took pill after lunch.  She reports vomiting approx 1 hr afters. She also took her spironolactone 25mg  yesterday and today.  Could she be having a drug interaction?  Please call

## 2016-11-01 NOTE — Telephone Encounter (Signed)
Left detailed message(DPR)

## 2016-11-01 NOTE — Telephone Encounter (Signed)
The only suggestion I have is continue taking the spironolactone and very conscientiously avoid sodium in her diet. This actually worked well for her prolonged time , until the recent decompensation

## 2016-11-01 NOTE — Telephone Encounter (Signed)
S/w B.G. Ruthann Cancer (son) and [place] Burchett(daughter-in-law) they state that pt took ethacrynic acid at 1-1:30pm after eating her breakfast and lunch (@~noon) and took this along with her spironolactone and started vomiting again. Pt will not take this medication again. What can we do now? Any suggestions?

## 2016-11-04 ENCOUNTER — Telehealth: Payer: Self-pay | Admitting: Cardiovascular Disease

## 2016-11-04 NOTE — Telephone Encounter (Signed)
Spoke with pt, she is asking to be seen because of diarrhea. She has not taken the ethacrynic acid since Friday. She has no swellinf and her weight is stable. She reports she just dose not feel well. Will discuss with brittney strader pa.

## 2016-11-04 NOTE — Telephone Encounter (Signed)
Spoke with pt, do not feel the diarrhea she is having is due to the medications at this time. She was advised to discuss with her primary care doctor per brittney strader pa. Patient voiced understanding

## 2016-11-04 NOTE — Telephone Encounter (Signed)
New Message  Pt call requesting to speak with RN about getting an appt for today. Pt stats she went on a fluid  Pill on Friday and was to take the mediation today but she states she feels sick to her stomach. Pt did not have medication name at the time. Will have upon call back. Please call back to discuss

## 2016-11-15 ENCOUNTER — Ambulatory Visit (INDEPENDENT_AMBULATORY_CARE_PROVIDER_SITE_OTHER): Payer: Medicare Other | Admitting: Pharmacist Clinician (PhC)/ Clinical Pharmacy Specialist

## 2016-11-15 DIAGNOSIS — I480 Paroxysmal atrial fibrillation: Secondary | ICD-10-CM | POA: Diagnosis not present

## 2016-11-15 DIAGNOSIS — Z7901 Long term (current) use of anticoagulants: Secondary | ICD-10-CM | POA: Diagnosis not present

## 2016-11-15 LAB — POCT INR: INR: 2.8

## 2016-11-28 ENCOUNTER — Ambulatory Visit (INDEPENDENT_AMBULATORY_CARE_PROVIDER_SITE_OTHER): Payer: Medicare Other | Admitting: Family Medicine

## 2016-11-28 ENCOUNTER — Telehealth: Payer: Self-pay | Admitting: Family Medicine

## 2016-11-28 ENCOUNTER — Encounter: Payer: Self-pay | Admitting: Family Medicine

## 2016-11-28 VITALS — BP 120/80 | HR 77 | Temp 97.9°F | Wt 134.0 lb

## 2016-11-28 DIAGNOSIS — I350 Nonrheumatic aortic (valve) stenosis: Secondary | ICD-10-CM

## 2016-11-28 DIAGNOSIS — J040 Acute laryngitis: Secondary | ICD-10-CM

## 2016-11-28 DIAGNOSIS — I4891 Unspecified atrial fibrillation: Secondary | ICD-10-CM

## 2016-11-28 DIAGNOSIS — H00015 Hordeolum externum left lower eyelid: Secondary | ICD-10-CM

## 2016-11-28 DIAGNOSIS — H1032 Unspecified acute conjunctivitis, left eye: Secondary | ICD-10-CM

## 2016-11-28 MED ORDER — AZITHROMYCIN 500 MG PO TABS: 500.0000 mg | ORAL_TABLET | Freq: Every day | ORAL | 0 refills | Status: DC

## 2016-11-28 NOTE — Telephone Encounter (Signed)
Miguel Dibble with [facility] called. They would like to extend her services and need orders. She can be reached at [phone].

## 2016-11-28 NOTE — Progress Notes (Signed)
   Subjective:    Patient ID: [name], female    DOB: 10/11/1923, 83 y.o.   MRN: [id number]  HPI She is here for evaluation of a five-day history that started with rhinorrhea, slight sore throat, nasal congestion but no cough, earache, fever or chills. She then noted since yesterday hoarse voice as well as purulent drainage from her left eye.   Review of Systems     Objective:   Physical Exam Alert and in no distress. Left conjunctiva is injected with a small stye present on the lower mid eyelid. Tympanic membranes and canals are normal. Pharyngeal area is normal. Neck is supple without adenopathy or thyromegaly. Cardiac exam shows an irregular rhythm with intermittent systolic murmur, no gallops. Lungs are clear to auscultation.        Assessment & Plan:  Acute bacterial conjunctivitis of left eye - Plan: azithromycin (ZITHROMAX) 500 MG tablet  Hordeolum externum of left lower eyelid  Atrial fibrillation with RVR (HCC)  Nonrheumatic aortic valve stenosis  Laryngitis I have decided to treat this with azithromycin, and case there is an underlying bacterial component to her cough and congestion. Cautioned that it could change her Coumadin dosing. She is scheduled to have this checked in the next several days. Recommend warm soaks to the left eye several times per day.

## 2016-11-28 NOTE — Telephone Encounter (Signed)
ok 

## 2016-11-28 NOTE — Telephone Encounter (Signed)
Called [name] to inform her Dr.Lalonde said okay L [facility] M message

## 2016-12-13 ENCOUNTER — Ambulatory Visit (INDEPENDENT_AMBULATORY_CARE_PROVIDER_SITE_OTHER): Payer: Medicare Other | Admitting: Pharmacist

## 2016-12-13 DIAGNOSIS — Z7901 Long term (current) use of anticoagulants: Secondary | ICD-10-CM

## 2016-12-13 DIAGNOSIS — I480 Paroxysmal atrial fibrillation: Secondary | ICD-10-CM | POA: Diagnosis not present

## 2016-12-13 LAB — POCT INR: INR: 2.6

## 2016-12-16 ENCOUNTER — Other Ambulatory Visit: Payer: Self-pay | Admitting: Cardiovascular Disease

## 2016-12-16 MED ORDER — EZETIMIBE 10 MG PO TABS: 10.0000 mg | ORAL_TABLET | Freq: Every day | ORAL | 2 refills | Status: DC

## 2016-12-16 MED ORDER — SPIRONOLACTONE 25 MG PO TABS: 25.0000 mg | ORAL_TABLET | Freq: Every day | ORAL | 2 refills | Status: DC

## 2016-12-16 MED ORDER — METOPROLOL TARTRATE 25 MG PO TABS: 25.0000 mg | ORAL_TABLET | Freq: Two times a day (BID) | ORAL | 2 refills | Status: DC

## 2016-12-16 NOTE — Telephone Encounter (Signed)
New message    Pt is calling asking for a call back. She said [facility] told her she needed to call us for her meds to be called in because they didn't have the number. Pt states she doesn't know which medication, the lady only told her metoprolol.

## 2016-12-16 NOTE — Telephone Encounter (Signed)
Called pateint to verify what meds she needed refilled. She stated she wasn't sure because the person on the phone told her they could not reach our office. I called [facility] and they said Metoprolol, Zetia and Spironolactone refilled. Rx sent to pharmacy, patient directly notified.

## 2017-01-10 ENCOUNTER — Ambulatory Visit (INDEPENDENT_AMBULATORY_CARE_PROVIDER_SITE_OTHER): Payer: Medicare Other | Admitting: Pharmacist

## 2017-01-10 DIAGNOSIS — Z7901 Long term (current) use of anticoagulants: Secondary | ICD-10-CM | POA: Diagnosis not present

## 2017-01-10 DIAGNOSIS — I480 Paroxysmal atrial fibrillation: Secondary | ICD-10-CM

## 2017-01-10 LAB — POCT INR: INR: 3.4

## 2017-01-21 ENCOUNTER — Telehealth: Payer: Self-pay | Admitting: Cardiovascular Disease

## 2017-01-21 DIAGNOSIS — R600 Localized edema: Secondary | ICD-10-CM

## 2017-01-21 NOTE — Telephone Encounter (Signed)
New Message     Pt needs prescription for Jode stockings and needs to know where to get them

## 2017-01-21 NOTE — Telephone Encounter (Signed)
Returned call to patient of Dr. Sallyanne Kuster  She has been wearing compression stockings since May and they are wearing out. She reports her swelling is better.   Provided contact info for [facility]. Mailed Rx for compression stockings. Patient does not wish to travel to [facility] for stockings.

## 2017-01-31 ENCOUNTER — Ambulatory Visit (INDEPENDENT_AMBULATORY_CARE_PROVIDER_SITE_OTHER): Payer: Medicare Other | Admitting: Pharmacist Clinician (PhC)/ Clinical Pharmacy Specialist

## 2017-01-31 DIAGNOSIS — Z7901 Long term (current) use of anticoagulants: Secondary | ICD-10-CM

## 2017-01-31 DIAGNOSIS — I480 Paroxysmal atrial fibrillation: Secondary | ICD-10-CM | POA: Diagnosis not present

## 2017-01-31 LAB — POCT INR: INR: 2.8

## 2017-02-03 ENCOUNTER — Telehealth: Payer: Self-pay | Admitting: Family Medicine

## 2017-02-03 MED ORDER — LEVOTHYROXINE SODIUM 88 MCG PO TABS: 88.0000 ug | ORAL_TABLET | Freq: Every day | ORAL | 3 refills | Status: DC

## 2017-02-03 NOTE — Telephone Encounter (Signed)
Please advise TSH in 09/2016 was 4. Does she need recheck?

## 2017-02-03 NOTE — Telephone Encounter (Signed)
Fax request from [facility]  L-thyroxine tabs 75 mcg  90 day supply

## 2017-02-04 ENCOUNTER — Other Ambulatory Visit: Payer: Self-pay

## 2017-02-04 MED ORDER — FOLIC ACID 1 MG PO TABS: 2.0000 mg | ORAL_TABLET | Freq: Every day | ORAL | 3 refills | Status: DC

## 2017-02-10 ENCOUNTER — Telehealth: Payer: Self-pay | Admitting: Family Medicine

## 2017-02-10 MED ORDER — IPRATROPIUM BROMIDE 0.03 % NA SOLN: 2.0000 | Freq: Two times a day (BID) | NASAL | 5 refills | Status: DC

## 2017-02-10 NOTE — Telephone Encounter (Signed)
[facility] request refill for Ipratropium BR  NSL 90 days supply     4 refills

## 2017-02-14 ENCOUNTER — Telehealth: Payer: Self-pay | Admitting: Family Medicine

## 2017-02-14 MED ORDER — LEVOTHYROXINE SODIUM 88 MCG PO TABS: 88.0000 ug | ORAL_TABLET | Freq: Every day | ORAL | 0 refills | Status: DC

## 2017-02-14 NOTE — Telephone Encounter (Signed)
rx called in. [name]

## 2017-02-14 NOTE — Telephone Encounter (Signed)
Rcvd refill request from [facility] for 90day supply Synthroid 75 mcg

## 2017-02-17 ENCOUNTER — Ambulatory Visit (INDEPENDENT_AMBULATORY_CARE_PROVIDER_SITE_OTHER): Payer: Medicare Other | Admitting: Pharmacist Clinician (PhC)/ Clinical Pharmacy Specialist

## 2017-02-17 ENCOUNTER — Ambulatory Visit (INDEPENDENT_AMBULATORY_CARE_PROVIDER_SITE_OTHER): Payer: Medicare Other | Admitting: Cardiovascular Disease

## 2017-02-17 VITALS — BP 148/82 | HR 75 | Ht 62.0 in | Wt 130.8 lb

## 2017-02-17 DIAGNOSIS — I2510 Atherosclerotic heart disease of native coronary artery without angina pectoris: Secondary | ICD-10-CM

## 2017-02-17 DIAGNOSIS — I2721 Secondary pulmonary arterial hypertension: Secondary | ICD-10-CM

## 2017-02-17 DIAGNOSIS — Z7901 Long term (current) use of anticoagulants: Secondary | ICD-10-CM

## 2017-02-17 DIAGNOSIS — I4819 Other persistent atrial fibrillation: Secondary | ICD-10-CM

## 2017-02-17 DIAGNOSIS — E785 Hyperlipidemia, unspecified: Secondary | ICD-10-CM | POA: Diagnosis not present

## 2017-02-17 DIAGNOSIS — I5032 Chronic diastolic (congestive) heart failure: Secondary | ICD-10-CM | POA: Diagnosis not present

## 2017-02-17 DIAGNOSIS — Z79899 Other long term (current) drug therapy: Secondary | ICD-10-CM | POA: Diagnosis not present

## 2017-02-17 DIAGNOSIS — I481 Persistent atrial fibrillation: Secondary | ICD-10-CM | POA: Diagnosis not present

## 2017-02-17 DIAGNOSIS — I350 Nonrheumatic aortic (valve) stenosis: Secondary | ICD-10-CM | POA: Diagnosis not present

## 2017-02-17 DIAGNOSIS — I110 Hypertensive heart disease with heart failure: Secondary | ICD-10-CM

## 2017-02-17 DIAGNOSIS — I480 Paroxysmal atrial fibrillation: Secondary | ICD-10-CM

## 2017-02-17 LAB — POCT INR: INR: 2

## 2017-02-17 MED ORDER — SPIRONOLACTONE 25 MG PO TABS: 25.0000 mg | ORAL_TABLET | Freq: Every day | ORAL | 2 refills | Status: DC

## 2017-02-17 NOTE — Patient Instructions (Signed)
Dr Sallyanne Kuster recommends that you continue on your current medications as directed. Please refer to the Current Medication list given to you today.  Your physician recommends that you return for lab work in December 2018.  Dr Sallyanne Kuster recommends that you schedule a follow-up appointment in 6 months. You will receive a reminder letter in the mail two months in advance. If you don't receive a letter, please call our office to schedule the follow-up appointment.  If you need a refill on your cardiac medications before your next appointment, please call your pharmacy.

## 2017-02-17 NOTE — Progress Notes (Signed)
Cardiology Office Note    Date:  02/18/2017   ID:  [name], DOB 01-Jan-1924, MRN [id number]  PCP:  [name], MD  Cardiologist:   Sanda Klein, MD   Chief Complaint  Patient presents with  . Follow-up    History of Present Illness:  [name] is a 83 y.o. female with chronic diastolic heart failure, coronary artery disease, persistent atrial fibrillation and hyperlipidemia returning for follow-up visit.  She has not had serious problems with heart failure exacerbation. She has at most mild ankle swelling. She denies orthopnea or PND and continues to be able to do light housework without difficulty. She denies any bleeding complications or focal neurological complaints.  The only diuretic she takes is spironolactone due to a severe skin allergy to thiazides and loop diuretics. She has taken ethacrynic acid without allergic response, but this causes severe nausea and vomiting.  She is on warfarin anticoagulation, with INR in therapeutic range today.  The rhythm today is atrial fibrillation/atypical atrial flutter with variable block and a ventricular rate of 75 bpm. She does not have palpitations and is never aware of the arrhythmia. She has not had dizziness or syncope. Her blood pressure is higher than usual today. Typical systolic blood pressure home is 130.  She has coronary artery disease and received a bare-metal stent (3x18 mm integrity) to the right coronary in 2010. She has not had significant angina pectoris since.  She has diastolic heart failure with a remote acute exacerbation in June 2014 and March 2016. Her echo shows an ejection fraction of 55-60% and pseudo-normal mitral inflow.  She has moderate aortic stenosis (estimated valve area 1.2 cm square, mean gradient 18 mm Hg, echo Sep 2016). She had recurrent persistent atrial fibrillation requiring multiple cardioversions, failed amiodarone therapy. She has treated hyperlipidemia, hypertension and  hypothyroidism. 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  Past Medical History:  Diagnosis Date  .  Acute respiratiory failure requiring BiPap 06/24/2011  . Allergy   . Aortic valvular stenosis, moderate to severe 06/24/2011   a. 10/2012 Echo: mod AS, Valve 1.25 cm^2 (VTI), 1.19cm^2 (Vmax).  . Arthritis    "back" (10/30/2012)  . Chronic diastolic CHF (congestive heart failure) ([place])    a. 10/2012 Echo: EF 55-60%, no rwma, Gr 2 DD, moderate AS, mod dil LA, mildly to mod dil RA.  Marland Kitchen Coronary artery disease    a. 03/2009 PCI RCA (3.0x18 BMS).  . Dyslipidemia   . Exertional shortness of breath   . Hypertension   . Hypothyroidism   . PAF (paroxysmal atrial fibrillation) ([place])    a. CHA2DS2VASc = 7-->chronic coumadin;  b. s/p DCCV 05/2011, 05/2012, 07/2014;  c. On amio.  . Rash    a. felt to be 2/2 lasix/torsemide in setting of sulfa allergy (lasix d/c ~ 06/2014, torsemide d/c 08/09/2014).    Past Surgical History:  Procedure Laterality Date  . ANGIOPLASTY    . APPENDECTOMY    . BACK SURGERY    . CARDIAC CATHETERIZATION  10/06/2009   Continue medical therapy  . CARDIAC CATHETERIZATION  04/07/2009   RCA stented with a 3x27mm stent resulting in a reduction of 90% narrowing to normal  . CARDIOVERSION  06/19/2011   Procedure: CARDIOVERSION;  Surgeon: Sanda Klein, MD;  Location: [facility];  Service: Cardiovascular;  Laterality: N/A;  . CARDIOVERSION  07/11/2010   Successful coversion to sinus rhythm  . CARDIOVERSION N/A 08/10/2014   Procedure: CARDIOVERSION;  Surgeon: Dani Gobble  Teandra Harlan, MD;  Location: [facility];  Service: Cardiovascular;  Laterality: N/A;  . CATARACT EXTRACTION W/ INTRAOCULAR LENS  IMPLANT, BILATERAL    . CORONARY ANGIOPLASTY WITH STENT PLACEMENT     "1" (10/30/2012)  . DILATION AND CURETTAGE OF UTERUS    . EXCISIONAL HEMORRHOIDECTOMY    . FRACTURE SURGERY    . HIP ARTHROPLASTY Right 04/17/2015   Procedure: HIP MONOPOLAR HIP HEMI ARTHROPLASTY;   Surgeon: Marybelle Killings, MD;  Location: [facility];  Service: Orthopedics;  Laterality: Right;  . LUMBAR DISC SURGERY  X 2   "cleaned out scar tissue"   . SHOULDER ACROMIOPLASTY Left    "fell; put a new ball in" (10/30/2012)  . WRIST FRACTURE SURGERY Left     Current Medications: Outpatient Medications Prior to Visit  Medication Sig Dispense Refill  . acetaminophen (TYLENOL) 650 MG CR tablet Take 650 mg by mouth 3 (three) times daily. Morning, supper and bedtime    . atorvastatin (LIPITOR) 40 MG tablet TAKE 1 TABLET DAILY 90 tablet 3  . docusate sodium (COLACE) 100 MG capsule Take 1 capsule (100 mg total) by mouth 2 (two) times daily. 10 capsule 0  . ezetimibe (ZETIA) 10 MG tablet Take 1 tablet (10 mg total) by mouth daily. 90 tablet 2  . folic acid (FOLVITE) 1 MG tablet Take 2 tablets (2 mg total) by mouth daily. 180 tablet 3  . ipratropium (ATROVENT) 0.03 % nasal spray Place 2 sprays into both nostrils 2 (two) times daily. Use as directed 30 mL 5  . levothyroxine (SYNTHROID, LEVOTHROID) 88 MCG tablet Take 1 tablet (88 mcg total) by mouth daily before breakfast. 90 tablet 0  . loratadine (CLARITIN) 10 MG tablet Take 10 mg by mouth daily.    . magnesium hydroxide (MILK OF MAGNESIA) 400 MG/5ML suspension Take 15 mLs by mouth daily as needed for mild constipation. 360 mL 0  . metoprolol tartrate (LOPRESSOR) 25 MG tablet Take 1 tablet (25 mg total) by mouth 2 (two) times daily. 180 tablet 2  . Multiple Vitamin (MULTIVITAMIN WITH MINERALS) TABS tablet Take 1 tablet by mouth daily. Centrum Silver    . polyethylene glycol (MIRALAX / GLYCOLAX) packet Take 17 g by mouth daily. 14 each 0  . warfarin (COUMADIN) 2.5 MG tablet Take 1 tablet by mouth daily or as directed by coumadin clinic 90 tablet 1  . metoCLOPramide (REGLAN) 5 MG tablet Take 1 tablet (5 mg total) by mouth 3 (three) times daily before meals. 12 tablet 0  . azithromycin (ZITHROMAX) 500 MG tablet Take 1 tablet (500 mg total) by mouth daily.  (Patient not taking: Reported on 02/17/2017) 3 tablet 0  . spironolactone (ALDACTONE) 25 MG tablet Take 1 tablet (25 mg total) by mouth daily. (Patient not taking: Reported on 02/17/2017) 90 tablet 2   No facility-administered medications prior to visit.      Allergies:   [name] [ethacrynic acid]; Tape; Furosemide; Sulfa antibiotics; [name]; and Torsemide   Social History   Social History  . Marital status: Married    Spouse name: N/A  . Number of children: 1  . Years of education: N/A   Occupational History  . Retired [employer]   Social History Main Topics  . Smoking status: Never Smoker  . Smokeless tobacco: Never Used  . Alcohol use No  . Drug use: No  . Sexual activity: No   Other Topics Concern  . Not on file   Social History Narrative   Lives in  [place], [place]. She got married in 1948.     Family History:  The patient's family history includes Alcohol abuse in her child; Cancer in her son; Cancer (age of onset: 74) in her mother; Heart attack in her father; Heart disease in her brother, brother, and father; Heart disease (age of onset: 79) in her mother; Heart failure in her mother; Hypertension in her brother, father, mother, and sister; Hypertension (age of onset: 64) in her child; Lung disease in her brother; Stroke (age of onset: 53) in her mother.   ROS:   Please see the history of present illness.    ROS All other systems reviewed and are negative.   PHYSICAL EXAM:   VS:  BP (!) 148/82   Pulse 75   Ht 5\' 2"  (1.575 m)   Wt 130 lb 12.8 oz (59.3 kg)   BMI 23.92 kg/m    GEN: Well nourished, well developed, in no acute distress  HEENT: normal  Neck: no JVD, carotid bruits, or masses Cardiac: irregular, 2/6 aortic ejection murmur,RRR; no diastolic murmurs, rubs, or gallops, mild right ankle edema  Respiratory:  clear to auscultation bilaterally, normal work of breathing GI: soft, nontender, nondistended, + BS MS: no deformity or atrophy   Skin: warm and dry, no rash Neuro:  Alert and Oriented x 3, Strength and sensation are intact Psych: euthymic mood, full affect  Wt Readings from Last 3 Encounters:  02/17/17 130 lb 12.8 oz (59.3 kg)  11/28/16 134 lb (60.8 kg)  10/24/16 138 lb (62.6 kg)      Studies/Labs Reviewed:   EKG:  EKG is ordered today.  The ekg ordered today demonstrates Atrial fibrillation, otherwise normal. QTC 422 ms  Recent Labs: 10/02/2016: B Natriuretic Peptide 317.8; TSH 4.760 10/05/2016: ALT 17 10/07/2016: Magnesium 2.4 10/08/2016: BUN 30; Creatinine, Ser 0.95; Hemoglobin 11.9; Platelets 255; Potassium 3.5; Sodium 140   Lipid Panel    Component Value Date/Time   CHOL 156 05/10/2016 0958   TRIG 141 05/10/2016 0958   HDL 40 (L) 05/10/2016 0958   CHOLHDL 3.9 05/10/2016 0958   VLDL 28 05/10/2016 0958   LDLCALC 88 05/10/2016 0958    Additional studies/ records that were reviewed today include:  Notes from Dr. Redmond School  ASSESSMENT:    1. Chronic diastolic (congestive) heart failure (HCC)   2. Persistent atrial fibrillation ([place])   3. Long term current use of anticoagulant therapy   4. Nonrheumatic aortic valve stenosis   5. PAH (pulmonary artery hypertension) ([place])   6. Coronary artery disease involving native coronary artery of native heart without angina pectoris   7. Dyslipidemia   8. Hypertensive heart disease with chronic diastolic congestive heart failure ([place])   9. Medication management      PLAN:  In order of problems listed above:  1. CHF: Seems to be well compensated clinically and euvolemic on spironolactone as her only diuretic. She asks about potentially increasing the dose of spironolactone and she has fluid retention, this can definitely be done but will have to have close monitoring of renal function and potassium levels. Reviewed the fact that this is a relatively weak diuretic and the best way to deal with her heart failure is very strict avoidance of sodium in her diet, she  has been doing exceptionally well with this task. 2. AFib: Asymptomatic, well rate controlled, appropriately anticoagulated for CHADSVasc 6 (age 56, gender, HTN, CHF, CAD). 3. Warfarin: She prefers this to a direct oral anticoagulant. No bleeding problems 4. AS:  Moderate by physical exam and by her most recent echo in May 2018. Mean gradient 23 mmHg, aortic valve area estimated at around 1 cm. 5. PAH: Estimated systolic PA pressure around 60 mmHg, likely primarily caused by diastolic heart failure 6. CAD: Asymptomatic. On statin, we stopped his Zetia at her request to simplify her list of medications. 7. HLP: Ideally would like her LDL under 70, but she wants to avoid a lengthy list of medications so we stopped ezetimibe. Current LDL under 100 is acceptable 8. HTN: Blood pressure is usually in optimal range, no changes made to her medications today.. 9. 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 Lindi Adie).    Medication Adjustments/Labs and Tests Ordered: Current medicines are reviewed at length with the patient today.  Concerns regarding medicines are outlined above.  Medication changes, Labs and Tests ordered today are listed in the Patient Instructions below. Patient Instructions  Dr Sallyanne Kuster recommends that you continue on your current medications as directed. Please refer to the Current Medication list given to you today.  Your physician recommends that you return for lab work in December 2018.  Dr Sallyanne Kuster recommends that you schedule a follow-up appointment in 6 months. You will receive a reminder letter in the mail two months in advance. If you don't receive a letter, please call our office to schedule the follow-up appointment.  If you need a refill on your cardiac medications before your next appointment, please call your pharmacy.    Signed, Sanda Klein, MD  02/18/2017 8:10 AM    [facility]  [facility], [place], [place]  [address] Phone: [phone]; Fax: [phone]

## 2017-02-18 ENCOUNTER — Encounter: Payer: Self-pay | Admitting: Cardiovascular Disease

## 2017-02-26 ENCOUNTER — Other Ambulatory Visit (INDEPENDENT_AMBULATORY_CARE_PROVIDER_SITE_OTHER): Payer: Medicare Other

## 2017-02-26 DIAGNOSIS — Z23 Encounter for immunization: Secondary | ICD-10-CM

## 2017-03-13 IMAGING — CT CT NECK W/ CM
4 of 9 series · 12 of 33 positions shown, 13 images · IV contrast (OMNIPAQUE)
Comparison: CT chest and abdomen from today

CLINICAL DATA: Axillary lymphadenopathy, suspicious for lymphoma

EXAM:
CT NECK WITH CONTRAST
TECHNIQUE: Multidetector CT imaging of the neck was performed using the
standard protocol following the bolus administration of intravenous
contrast.
CONTRAST:  100mL OMNIPAQUE IOHEXOL 300 MG/ML  SOLN

[Series 3: cap with st · axial · 0.72mm/px · z∈[-599,-399]mm · 2 of 122 slices shown, 3 images]
[im 41/122  soft-tissue]
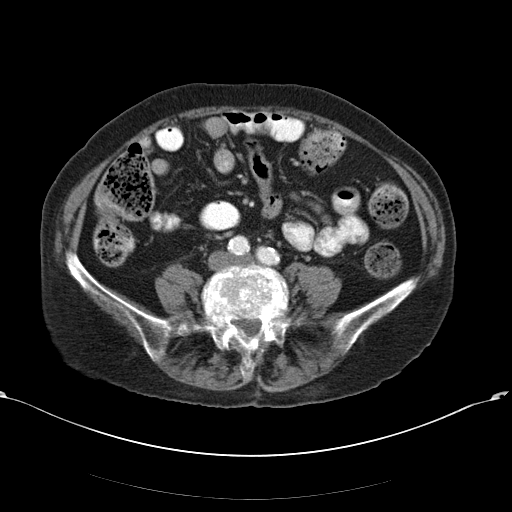
[im 41/122  bone]
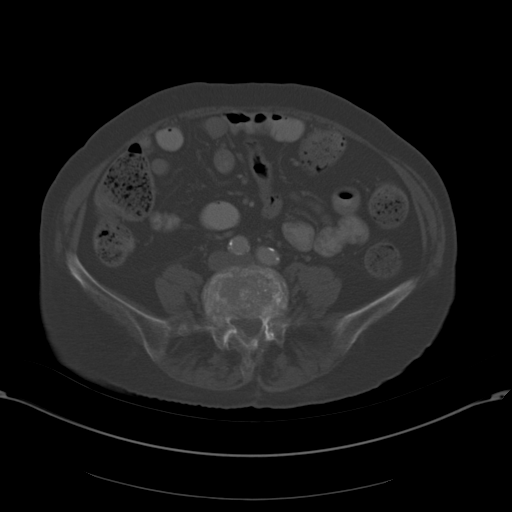
[im 81/122  bone]
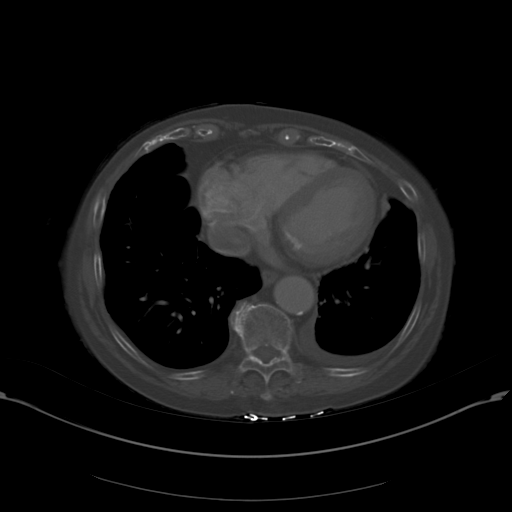

[Series 6: neck st · axial · 0.44mm/px · z∈[-202,-128]mm · 2 of 112 slices shown]
[im 38/112  bone]
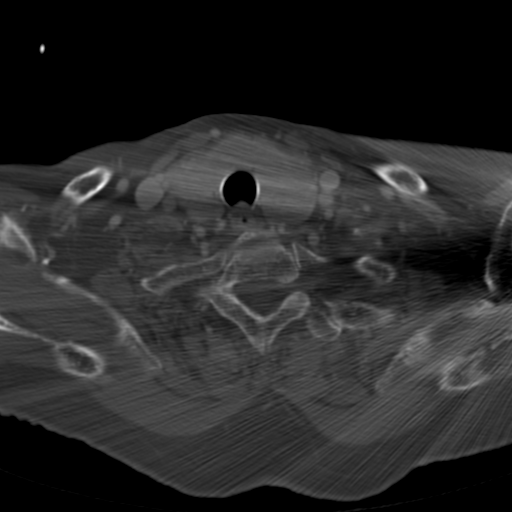
[im 75/112  bone]
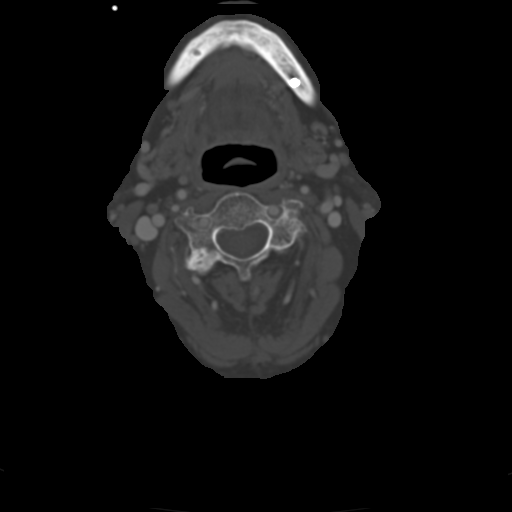

[Series 602: cor · coronal · 1.19mm/px · 3 of 76 slices shown]
[im 19/76  bone]
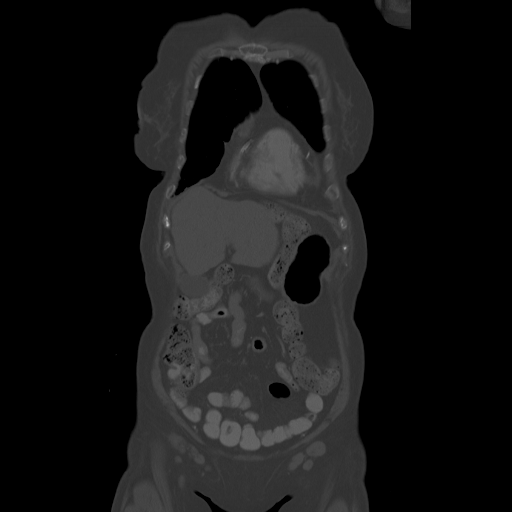
[im 38/76  bone]
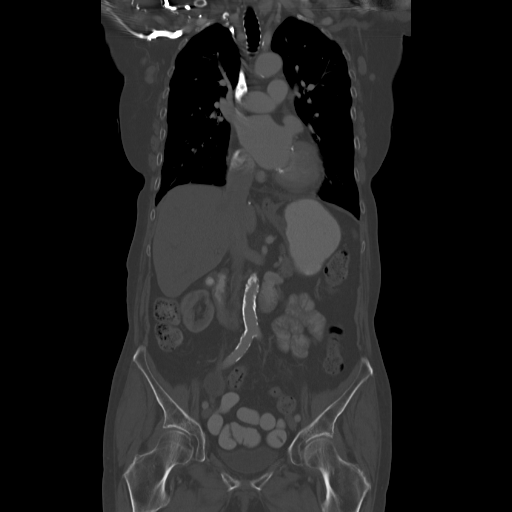
[im 57/76  bone]
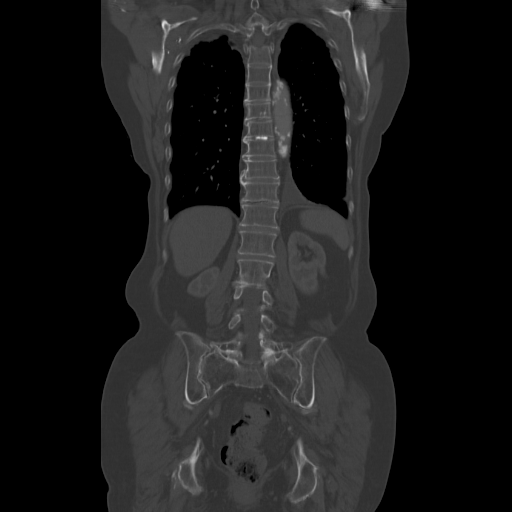

[Series 603: sag · sagittal · 1.19mm/px · 5 of 101 slices shown]
[im 15/101  bone]
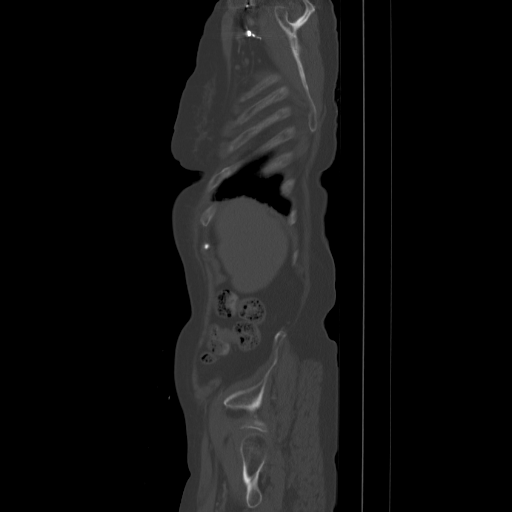
[im 29/101  bone]
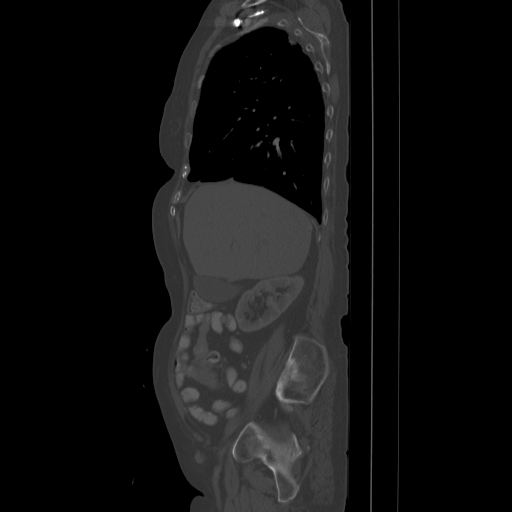
[im 43/101  bone]
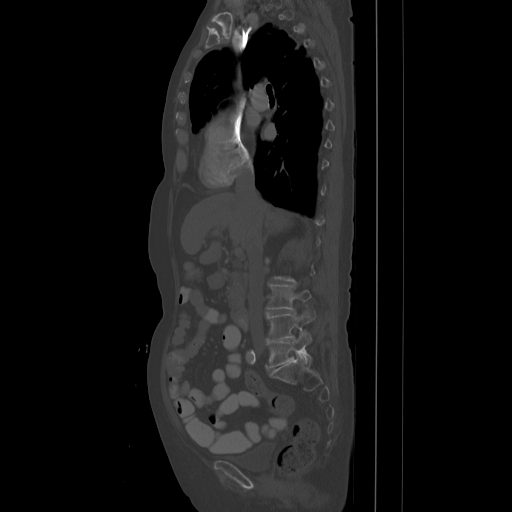
[im 58/101  bone]
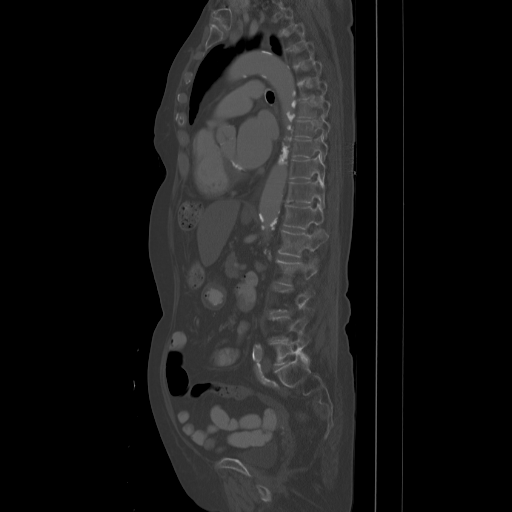
[im 72/101  bone]
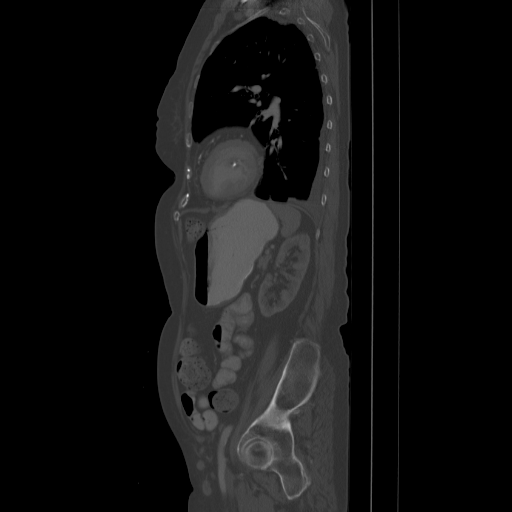

[12 of 33 positions shown; findings below may reference images not displayed]

FINDINGS: Pharynx and larynx: Normal pharynx and larynx.  No mass or edema.

Salivary glands: Parotid and submandibular glands are normal
bilaterally. No mass lesion.

Thyroid: Thyroid is diffusely enlarged bilaterally with lobular
contour compatible with goiter. Hyper enhancing nodule right mid
lobe measures 6 x 8 x 12 mm. No other nodules identified.

Lymph nodes: No pathologic adenopathy in the neck. 6 mm node in the
right neck at the level the hyoid bone. Axillary adenopathy
described on the CT chest from today.

Vascular: Carotid artery calcification bilaterally. Carotid artery
and jugular vein patent bilaterally.

Limited intracranial: Negative

Visualized orbits: Not imaged

Mastoids and visualized paranasal sinuses: Negative

Skeleton: Cervical spondylosis most notable at C5-6 and C6-7.
Diffuse facet degeneration. No fracture or mass lesion.
IMPRESSION: Negative for cervical adenopathy.

Diffuse enlargement of the thyroid compatible with goiter. Hyper
enhancing nodule right mid lobe measuring 6 x 8 x 12 mm. This could
be further evaluated with thyroid ultrasound, if clinically
indicated.

## 2017-03-17 ENCOUNTER — Ambulatory Visit (INDEPENDENT_AMBULATORY_CARE_PROVIDER_SITE_OTHER): Payer: Medicare Other | Admitting: Pharmacist

## 2017-03-17 DIAGNOSIS — Z7901 Long term (current) use of anticoagulants: Secondary | ICD-10-CM | POA: Diagnosis not present

## 2017-03-17 DIAGNOSIS — I480 Paroxysmal atrial fibrillation: Secondary | ICD-10-CM

## 2017-03-17 LAB — POCT INR: INR: 1.8

## 2017-04-07 ENCOUNTER — Ambulatory Visit (INDEPENDENT_AMBULATORY_CARE_PROVIDER_SITE_OTHER): Payer: Medicare Other | Admitting: Pharmacist Clinician (PhC)/ Clinical Pharmacy Specialist

## 2017-04-07 DIAGNOSIS — Z7901 Long term (current) use of anticoagulants: Secondary | ICD-10-CM

## 2017-04-07 DIAGNOSIS — I480 Paroxysmal atrial fibrillation: Secondary | ICD-10-CM

## 2017-04-07 LAB — POCT INR: INR: 2.7

## 2017-04-24 ENCOUNTER — Other Ambulatory Visit: Payer: Self-pay | Admitting: Family Medicine

## 2017-05-13 ENCOUNTER — Ambulatory Visit (INDEPENDENT_AMBULATORY_CARE_PROVIDER_SITE_OTHER): Payer: Medicare Other | Admitting: Pharmacist Clinician (PhC)/ Clinical Pharmacy Specialist

## 2017-05-13 DIAGNOSIS — I4891 Unspecified atrial fibrillation: Secondary | ICD-10-CM | POA: Diagnosis not present

## 2017-05-13 DIAGNOSIS — Z7901 Long term (current) use of anticoagulants: Secondary | ICD-10-CM | POA: Diagnosis not present

## 2017-05-13 DIAGNOSIS — I480 Paroxysmal atrial fibrillation: Secondary | ICD-10-CM | POA: Diagnosis not present

## 2017-05-13 LAB — POCT INR: INR: 3.5

## 2017-05-28 ENCOUNTER — Other Ambulatory Visit: Payer: Self-pay | Admitting: Cardiovascular Disease

## 2017-06-03 ENCOUNTER — Ambulatory Visit (INDEPENDENT_AMBULATORY_CARE_PROVIDER_SITE_OTHER): Payer: Medicare Other | Admitting: Pharmacist Clinician (PhC)/ Clinical Pharmacy Specialist

## 2017-06-03 DIAGNOSIS — I480 Paroxysmal atrial fibrillation: Secondary | ICD-10-CM | POA: Diagnosis not present

## 2017-06-03 DIAGNOSIS — Z7901 Long term (current) use of anticoagulants: Secondary | ICD-10-CM

## 2017-06-03 LAB — POCT INR: INR: 2.7

## 2017-06-06 ENCOUNTER — Other Ambulatory Visit: Payer: Self-pay | Admitting: Medical

## 2017-06-06 ENCOUNTER — Ambulatory Visit (INDEPENDENT_AMBULATORY_CARE_PROVIDER_SITE_OTHER): Payer: Medicare Other | Admitting: Medical

## 2017-06-06 ENCOUNTER — Encounter: Payer: Self-pay | Admitting: Medical

## 2017-06-06 ENCOUNTER — Ambulatory Visit
Admission: RE | Admit: 2017-06-06 | Discharge: 2017-06-06 | Disposition: A | Discharge status: Home / Self Care | Payer: Medicare Other | Source: Ambulatory Visit | Attending: Medical | Admitting: Medical

## 2017-06-06 VITALS — BP 116/68 | HR 81 | Temp 98.1°F | Wt 133.6 lb

## 2017-06-06 DIAGNOSIS — R05 Cough: Secondary | ICD-10-CM

## 2017-06-06 DIAGNOSIS — R059 Cough, unspecified: Secondary | ICD-10-CM

## 2017-06-06 DIAGNOSIS — R062 Wheezing: Secondary | ICD-10-CM | POA: Diagnosis not present

## 2017-06-06 DIAGNOSIS — R6889 Other general symptoms and signs: Secondary | ICD-10-CM | POA: Diagnosis not present

## 2017-06-06 DIAGNOSIS — J029 Acute pharyngitis, unspecified: Secondary | ICD-10-CM

## 2017-06-06 LAB — POC INFLUENZA A&B (BINAX/QUICKVUE)
INFLUENZA A, POC: NEGATIVE
INFLUENZA B, POC: NEGATIVE

## 2017-06-06 MED ORDER — AMOXICILLIN 500 MG PO TABS: 500.0000 mg | ORAL_TABLET | Freq: Three times a day (TID) | ORAL | 0 refills | Status: DC

## 2017-06-06 MED ORDER — ALBUTEROL SULFATE HFA 108 (90 BASE) MCG/ACT IN AERS: 2.0000 | INHALATION_SPRAY | Freq: Four times a day (QID) | RESPIRATORY_TRACT | 0 refills | Status: DC | PRN

## 2017-06-06 NOTE — Progress Notes (Signed)
Subjective:  [name] is a 84 y.o. female who presents for cough, illness.  She reports 3-4-day history of cough, getting up some phlegm, headache, sore throat, not feeling well.  No nausea, vomiting, diarrhea, chills or body aches.  She had to get up the other night at 3:30 AM to sit in a chair as she was short of breath.  She notes that she rarely gets sick.  Her husband has had a cold recently and she picked up from him.. She is using Coricidin HBP over-the-counter for symptoms.   Patient is not a smoker. No other aggravating or relieving factors.  No other c/o.  The following portions of the patient's history were reviewed and updated as appropriate: allergies, current medications, past family history, past medical history, past social history, past surgical history and problem list.  ROS as in subjective  Past Medical History:  Diagnosis Date  .  Acute respiratiory failure requiring BiPap 06/24/2011  . Allergy   . Aortic valvular stenosis, moderate to severe 06/24/2011   a. 10/2012 Echo: mod AS, Valve 1.25 cm^2 (VTI), 1.19cm^2 (Vmax).  . Arthritis    "back" (10/30/2012)  . Chronic diastolic CHF (congestive heart failure) ([place])    a. 10/2012 Echo: EF 55-60%, no rwma, Gr 2 DD, moderate AS, mod dil LA, mildly to mod dil RA.  Marland Kitchen Coronary artery disease    a. 03/2009 PCI RCA (3.0x18 BMS).  . Dyslipidemia   . Exertional shortness of breath   . Hypertension   . Hypothyroidism   . PAF (paroxysmal atrial fibrillation) ([place])    a. CHA2DS2VASc = 7-->chronic coumadin;  b. s/p DCCV 05/2011, 05/2012, 07/2014;  c. On amio.  . Rash    a. felt to be 2/2 lasix/torsemide in setting of sulfa allergy (lasix d/c ~ 06/2014, torsemide d/c 08/09/2014).   Current Outpatient Medications on File Prior to Visit  Medication Sig Dispense Refill  . acetaminophen (TYLENOL) 650 MG CR tablet Take 650 mg by mouth 3 (three) times daily. Morning, supper and bedtime    . atorvastatin (LIPITOR) 40 MG tablet TAKE 1 TABLET  DAILY 90 tablet 3  . folic acid (FOLVITE) 1 MG tablet Take 2 tablets (2 mg total) by mouth daily. 180 tablet 3  . ipratropium (ATROVENT) 0.03 % nasal spray Place 2 sprays into both nostrils 2 (two) times daily. Use as directed 30 mL 5  . levothyroxine (SYNTHROID, LEVOTHROID) 88 MCG tablet TAKE 1 TABLET DAILY BEFORE BREAKFAST 90 tablet 0  . magnesium hydroxide (MILK OF MAGNESIA) 400 MG/5ML suspension Take 15 mLs by mouth daily as needed for mild constipation. 360 mL 0  . metoprolol tartrate (LOPRESSOR) 25 MG tablet Take 1 tablet (25 mg total) by mouth 2 (two) times daily. 180 tablet 2  . Multiple Vitamin (MULTIVITAMIN WITH MINERALS) TABS tablet Take 1 tablet by mouth daily. Centrum Silver    . spironolactone (ALDACTONE) 25 MG tablet Take 1 tablet (25 mg total) by mouth daily. 90 tablet 2  . warfarin (COUMADIN) 2.5 MG tablet TAKE 1 TABLET DAILY OR AS DIRECTED BY COUMADIN CLINIC 90 tablet 1  . metoCLOPramide (REGLAN) 5 MG tablet Take 1 tablet (5 mg total) by mouth 3 (three) times daily before meals. 12 tablet 0   No current facility-administered medications on file prior to visit.     Objective: BP 116/68   Pulse 81   Temp 98.1 F (36.7 C)   Wt 133 lb 9.6 oz (60.6 kg)   SpO2 98%  BMI 24.44 kg/m   General appearance: Alert, WD/WN, no distress, somewhat ill appearing                             Skin: warm, no rash, no diaphoresis                           Head: no sinus tenderness                            Eyes: conjunctiva normal, corneas clear, PERRLA                            Ears: pearly TMs, external ear canals normal                          Nose: septum midline, turbinates swollen, with erythema and clear discharge             Mouth/throat: MMM, tongue normal, mild pharyngeal erythema                           Neck: supple, no adenopathy, no thyromegaly, nontender                          Heart: RRR, normal S1, S2, no murmurs                         Lungs: +bronchial breath  sounds, no rhonchi, +faint upper wheezes, no rales                Extremities: no edema, nontender      Assessment: Encounter Diagnoses  Name Primary?  . Cough Yes  . Wheezing   . Sore throat   . Flu-like symptoms      Plan: Given her exam findings symptoms, we will send for chest x-ray, flu swab done..  I want to rule out pneumonia.  Advised rest, hydration, can sit upright if needed for shortness of breath.  She can continue Coricidin HBP.  We will call shortly with results.  [name] was seen today for cold.  Diagnoses and all orders for this visit:  Cough -     DG Chest 2 View; Future  Wheezing -     DG Chest 2 View; Future  Sore throat -     DG Chest 2 View; Future  Flu-like symptoms -     DG Chest 2 View; Future

## 2017-06-06 NOTE — Addendum Note (Signed)
Addended by: Tyrone Apple on: 06/06/2017 11:10 AM   Modules accepted: Orders

## 2017-06-09 ENCOUNTER — Telehealth: Payer: Self-pay | Admitting: Family Medicine

## 2017-06-09 NOTE — Telephone Encounter (Signed)
Call first thing Tuesday morning, get update on all her current symptoms, how well she is hydrating?

## 2017-06-09 NOTE — Telephone Encounter (Signed)
Pt called and left message that she is not any better today.  Please advise [phone] Is there anything she can do?

## 2017-06-10 ENCOUNTER — Other Ambulatory Visit: Payer: Self-pay | Admitting: Medical

## 2017-06-10 MED ORDER — BENZONATATE 100 MG PO CAPS: 100.0000 mg | ORAL_CAPSULE | Freq: Four times a day (QID) | ORAL | 0 refills | Status: DC | PRN

## 2017-06-10 NOTE — Telephone Encounter (Signed)
Called pt she is feeling better today, but still as a little cough and she is using her inhaler , and she said that she made an appt for Thursday to be seen with her husband

## 2017-06-10 NOTE — Telephone Encounter (Signed)
I sent a cough drop she can use up to 3 times daily, hydrate well, and f/u as planned.

## 2017-06-10 NOTE — Telephone Encounter (Signed)
Called and notified pt of this

## 2017-06-12 ENCOUNTER — Other Ambulatory Visit: Payer: Self-pay | Admitting: Medical

## 2017-06-12 ENCOUNTER — Ambulatory Visit (INDEPENDENT_AMBULATORY_CARE_PROVIDER_SITE_OTHER): Payer: Medicare Other | Admitting: Medical

## 2017-06-12 ENCOUNTER — Encounter: Payer: Self-pay | Admitting: Medical

## 2017-06-12 VITALS — BP 110/66 | HR 70 | Wt 129.6 lb

## 2017-06-12 DIAGNOSIS — R9389 Abnormal findings on diagnostic imaging of other specified body structures: Secondary | ICD-10-CM | POA: Diagnosis not present

## 2017-06-12 DIAGNOSIS — R059 Cough, unspecified: Secondary | ICD-10-CM

## 2017-06-12 DIAGNOSIS — R05 Cough: Secondary | ICD-10-CM | POA: Diagnosis not present

## 2017-06-12 DIAGNOSIS — J988 Other specified respiratory disorders: Secondary | ICD-10-CM

## 2017-06-12 MED ORDER — FLUTICASONE FUROATE-VILANTEROL 100-25 MCG/INH IN AEPB: 1.0000 | INHALATION_SPRAY | Freq: Every day | RESPIRATORY_TRACT | 0 refills | Status: DC

## 2017-06-12 NOTE — Progress Notes (Signed)
Subjective:  [name] is a 84 y.o. female who presents for recheck on cough, illness.  I saw her last week for cough and congestion.  Her husband have both been sick with similar, he was diagnosed with pneumonia.  She has 4 more days left of her antibiotic.  She is not using any over-the-counter medication for the symptoms.  She is using [employer] some.  She is also using the inhaler with not much improvement.  Overall she feels at least 50% better than last week.  She denies fever, no nausea or vomiting, no wheezing or shortness of breath.  Just feels like she has mucus still in her chest and head.  No other complaint.  The following portions of the patient's history were reviewed and updated as appropriate: allergies, current medications, past family history, past medical history, past social history, past surgical history and problem list.  ROS as in subjective  Past Medical History:  Diagnosis Date  .  Acute respiratiory failure requiring BiPap 06/24/2011  . Allergy   . Aortic valvular stenosis, moderate to severe 06/24/2011   a. 10/2012 Echo: mod AS, Valve 1.25 cm^2 (VTI), 1.19cm^2 (Vmax).  . Arthritis    "back" (10/30/2012)  . Chronic diastolic CHF (congestive heart failure) ([place])    a. 10/2012 Echo: EF 55-60%, no rwma, Gr 2 DD, moderate AS, mod dil LA, mildly to mod dil RA.  Marland Kitchen Coronary artery disease    a. 03/2009 PCI RCA (3.0x18 BMS).  . Dyslipidemia   . Exertional shortness of breath   . Hypertension   . Hypothyroidism   . PAF (paroxysmal atrial fibrillation) ([place])    a. CHA2DS2VASc = 7-->chronic coumadin;  b. s/p DCCV 05/2011, 05/2012, 07/2014;  c. On amio.  . Rash    a. felt to be 2/2 lasix/torsemide in setting of sulfa allergy (lasix d/c ~ 06/2014, torsemide d/c 08/09/2014).   Current Outpatient Medications on File Prior to Visit  Medication Sig Dispense Refill  . acetaminophen (TYLENOL) 650 MG CR tablet Take 650 mg by mouth 3 (three) times daily. Morning, supper and  bedtime    . albuterol (PROVENTIL HFA;VENTOLIN HFA) 108 (90 Base) MCG/ACT inhaler Inhale 2 puffs into the lungs every 6 (six) hours as needed for wheezing or shortness of breath. 1 Inhaler 0  . amoxicillin (AMOXIL) 500 MG tablet Take 1 tablet (500 mg total) by mouth 3 (three) times daily. 30 tablet 0  . atorvastatin (LIPITOR) 40 MG tablet TAKE 1 TABLET DAILY 90 tablet 3  . benzonatate (TESSALON PERLES) 100 MG capsule Take 1 capsule (100 mg total) by mouth every 6 (six) hours as needed for cough. 30 capsule 0  . folic acid (FOLVITE) 1 MG tablet Take 2 tablets (2 mg total) by mouth daily. 180 tablet 3  . ipratropium (ATROVENT) 0.03 % nasal spray Place 2 sprays into both nostrils 2 (two) times daily. Use as directed 30 mL 5  . levothyroxine (SYNTHROID, LEVOTHROID) 88 MCG tablet TAKE 1 TABLET DAILY BEFORE BREAKFAST 90 tablet 0  . magnesium hydroxide (MILK OF MAGNESIA) 400 MG/5ML suspension Take 15 mLs by mouth daily as needed for mild constipation. 360 mL 0  . metoprolol tartrate (LOPRESSOR) 25 MG tablet Take 1 tablet (25 mg total) by mouth 2 (two) times daily. 180 tablet 2  . Multiple Vitamin (MULTIVITAMIN WITH MINERALS) TABS tablet Take 1 tablet by mouth daily. Centrum Silver    . spironolactone (ALDACTONE) 25 MG tablet Take 1 tablet (25 mg total) by mouth  daily. 90 tablet 2  . warfarin (COUMADIN) 2.5 MG tablet TAKE 1 TABLET DAILY OR AS DIRECTED BY COUMADIN CLINIC 90 tablet 1  . metoCLOPramide (REGLAN) 5 MG tablet Take 1 tablet (5 mg total) by mouth 3 (three) times daily before meals. 12 tablet 0   No current facility-administered medications on file prior to visit.     Objective: BP 110/66   Pulse 70   Wt 129 lb 9.6 oz (58.8 kg)   SpO2 98%   BMI 23.70 kg/m   General appearance: Alert, WD/WN, no distress, improved appearing from last visit                             Skin: warm, no rash, no diaphoresis                           Head: no sinus tenderness                            Eyes:  conjunctiva normal, corneas clear, PERRLA                            Ears: pearly TMs, external ear canals normal                          Nose: septum midline, turbinates swollen, with erythema and no discharge             Mouth/throat: MMM, tongue normal, mild pharyngeal erythema                           Neck: supple, no adenopathy, no thyromegaly, nontender                          Heart: RRR, normal S1, S2, no murmurs                         Lungs: +bronchial breath sounds, no rhonchi, no wheezes, no rales                Extremities: no edema, nontender      Assessment: Encounter Diagnoses  Name Primary?  . Cough Yes  . Respiratory tract infection   . Abnormal chest x-ray      Plan: Discussed her symptoms.  She seems some improved.  Reviewed her recent x-ray results.  I think she has to give treatment more time.  We reviewed the instructions below.  Plan to repeat chest x-ray in a few weeks.    Patient Instructions  Recommendations:  Elizebeth Koller out the Amoxicillin antibiotic  You can continue to use the Tessalon Perles cough drops as needed  Begin over the counter Mucinex DM twice daily for the next 5 days  Increase your water intake  Begin Breo inhaler, 1 puff daily.  Rinse your mouth out with water a few minutes after using this  Continue using the Ventolin/Proventil inhaler 2 puffs 3 times daily for the next 3-5 days  If not back to normal within 4-5 days, call back    [name] was seen today for cough.  Diagnoses and all orders for this visit:  Cough  Respiratory tract infection  Abnormal chest x-ray  Other orders -  fluticasone furoate-vilanterol (BREO ELLIPTA) 100-25 MCG/INH AEPB; Inhale 1 puff into the lungs daily.

## 2017-06-12 NOTE — Patient Instructions (Signed)
Recommendations:  [name] out the Amoxicillin antibiotic  You can continue to use the Tessalon Perles cough drops as needed  Begin over the counter Mucinex DM twice daily for the next 5 days  Increase your water intake  Begin Breo inhaler, 1 puff daily.  Rinse your mouth out with water a few minutes after using this  Continue using the Ventolin/Proventil inhaler 2 puffs 3 times daily for the next 3-5 days  If not back to normal within 4-5 days, call back

## 2017-07-01 ENCOUNTER — Ambulatory Visit (INDEPENDENT_AMBULATORY_CARE_PROVIDER_SITE_OTHER): Payer: Medicare Other | Admitting: Pharmacist

## 2017-07-01 DIAGNOSIS — Z7901 Long term (current) use of anticoagulants: Secondary | ICD-10-CM

## 2017-07-01 DIAGNOSIS — I480 Paroxysmal atrial fibrillation: Secondary | ICD-10-CM | POA: Diagnosis not present

## 2017-07-01 LAB — POCT INR: INR: 2.4

## 2017-07-08 ENCOUNTER — Inpatient Hospital Stay: Payer: Medicare Other

## 2017-07-08 ENCOUNTER — Inpatient Hospital Stay: Payer: Medicare Other | Attending: Hematology and Oncology | Admitting: Hematology and Oncology

## 2017-07-08 DIAGNOSIS — C865 Angioimmunoblastic T-cell lymphoma: Secondary | ICD-10-CM | POA: Diagnosis not present

## 2017-07-08 DIAGNOSIS — Z7901 Long term (current) use of anticoagulants: Secondary | ICD-10-CM | POA: Diagnosis not present

## 2017-07-08 DIAGNOSIS — C8440 Peripheral T-cell lymphoma, not classified, unspecified site: Secondary | ICD-10-CM

## 2017-07-08 DIAGNOSIS — Z79899 Other long term (current) drug therapy: Secondary | ICD-10-CM

## 2017-07-08 LAB — CBC WITH DIFFERENTIAL/PLATELET
BASOS ABS: 0 10*3/uL (ref 0.0–0.1)
Basophils Relative: 0 %
EOS ABS: 0.1 10*3/uL (ref 0.0–0.5)
EOS PCT: 2 %
HCT: 35.6 % (ref 34.8–46.6)
Hemoglobin: 11.4 g/dL — ABNORMAL LOW (ref 11.6–15.9)
Lymphocytes Relative: 17 %
Lymphs Abs: 1 10*3/uL (ref 0.9–3.3)
MCH: 28.7 pg (ref 25.1–34.0)
MCHC: 31.9 g/dL (ref 31.5–36.0)
MCV: 89.8 fL (ref 79.5–101.0)
MONO ABS: 0.6 10*3/uL (ref 0.1–0.9)
Monocytes Relative: 10 %
Neutro Abs: 4.2 10*3/uL (ref 1.5–6.5)
Neutrophils Relative %: 71 %
PLATELETS: 235 10*3/uL (ref 145–400)
RBC: 3.97 MIL/uL (ref 3.70–5.45)
RDW: 14.1 % (ref 11.2–14.5)
WBC: 5.9 10*3/uL (ref 3.9–10.3)

## 2017-07-08 LAB — COMPREHENSIVE METABOLIC PANEL
ALT: 12 U/L (ref 0–55)
AST: 20 U/L (ref 5–34)
Albumin: 3.6 g/dL (ref 3.5–5.0)
Alkaline Phosphatase: 81 U/L (ref 40–150)
Anion gap: 8 (ref 3–11)
BUN: 18 mg/dL (ref 7–26)
CHLORIDE: 106 mmol/L (ref 98–109)
CO2: 25 mmol/L (ref 22–29)
CREATININE: 1.13 mg/dL — AB (ref 0.60–1.10)
Calcium: 9.3 mg/dL (ref 8.4–10.4)
GFR, EST AFRICAN AMERICAN: 48 mL/min — AB (ref >60)
GFR, EST NON AFRICAN AMERICAN: 41 mL/min — AB (ref >60)
Glucose, Bld: 99 mg/dL (ref 70–140)
POTASSIUM: 4.3 mmol/L (ref 3.5–5.1)
Sodium: 139 mmol/L (ref 136–145)
Total Bilirubin: 1 mg/dL (ref 0.2–1.2)
Total Protein: 7.2 g/dL (ref 6.4–8.3)

## 2017-07-08 LAB — LACTATE DEHYDROGENASE: LDH: 195 U/L (ref 125–245)

## 2017-07-08 NOTE — Progress Notes (Signed)
Patient Care Team: [name], MD as PCP - General (Family Medicine)  DIAGNOSIS:  Encounter Diagnosis  Name Primary?  . Angioimmunoblastic lymphoma ([place])     SUMMARY OF ONCOLOGIC HISTORY:   Angioimmunoblastic lymphoma ([place])   03/16/2015 Initial Diagnosis    Left x-ray lymph node biopsy: Atypical lymphoid proliferation suspicious for lymphoma, flow cytometry revealed T cells CD3, CD43 and CD5 associated with BCL-2 expression, suspicious for angioimmunoblastic T-cell lymphoma      03/29/2015 Imaging     Borderline bil axillary LN and inguinal LN      04/15/2015 - 04/19/2015 Hospital Admission    Closed Rt Hip Fracture      07/06/2015 Imaging    Progressive bil Axillary and inguinal lymphadenopathy        CHIEF COMPLIANT: Follow-up of lymphoma  INTERVAL HISTORY: [name] is a 84 year old with above-mentioned history of enlarged angioimmunoblastic T-cell lymphoma who has had a pretty indolent course all along.  She reports no progressive worsening lymphadenopathy.  She does not have any fevers chills night sweats or weight loss.  REVIEW OF SYSTEMS:   Constitutional: Denies fevers, chills or abnormal weight loss Eyes: Denies blurriness of vision Ears, nose, mouth, throat, and face: Denies mucositis or sore throat Respiratory: Denies cough, dyspnea or wheezes Cardiovascular: Denies palpitation, chest discomfort Gastrointestinal:  Denies nausea, heartburn or change in bowel habits Skin: Denies abnormal skin rashes Lymphatics: Denies new lymphadenopathy or easy bruising Neurological:Denies numbness, tingling or new weaknesses Behavioral/Psych: Mood is stable, no new changes  Extremities: No lower extremity edema  All other systems were reviewed with the patient and are negative.  I have reviewed the past medical history, past surgical history, social history and family history with the patient and they are unchanged from previous note.  ALLERGIES:  is allergic  to edecrin [ethacrynic acid]; tape; furosemide; sulfa antibiotics; thiazide-type diuretics; and torsemide.  MEDICATIONS:  Current Outpatient Medications  Medication Sig Dispense Refill  . acetaminophen (TYLENOL) 650 MG CR tablet Take 650 mg by mouth 3 (three) times daily. Morning, supper and bedtime    . albuterol (PROVENTIL HFA;VENTOLIN HFA) 108 (90 Base) MCG/ACT inhaler Inhale 2 puffs into the lungs every 6 (six) hours as needed for wheezing or shortness of breath. 1 Inhaler 0  . amoxicillin (AMOXIL) 500 MG tablet Take 1 tablet (500 mg total) by mouth 3 (three) times daily. 30 tablet 0  . atorvastatin (LIPITOR) 40 MG tablet TAKE 1 TABLET DAILY 90 tablet 3  . benzonatate (TESSALON PERLES) 100 MG capsule Take 1 capsule (100 mg total) by mouth every 6 (six) hours as needed for cough. 30 capsule 0  . fluticasone furoate-vilanterol (BREO ELLIPTA) 100-25 MCG/INH AEPB Inhale 1 puff into the lungs daily. 1 each 0  . folic acid (FOLVITE) 1 MG tablet Take 2 tablets (2 mg total) by mouth daily. 180 tablet 3  . ipratropium (ATROVENT) 0.03 % nasal spray Place 2 sprays into both nostrils 2 (two) times daily. Use as directed 30 mL 5  . levothyroxine (SYNTHROID, LEVOTHROID) 88 MCG tablet TAKE 1 TABLET DAILY BEFORE BREAKFAST 90 tablet 0  . magnesium hydroxide (MILK OF MAGNESIA) 400 MG/5ML suspension Take 15 mLs by mouth daily as needed for mild constipation. 360 mL 0  . metoCLOPramide (REGLAN) 5 MG tablet Take 1 tablet (5 mg total) by mouth 3 (three) times daily before meals. 12 tablet 0  . metoprolol tartrate (LOPRESSOR) 25 MG tablet Take 1 tablet (25 mg total) by mouth 2 (two) times  daily. 180 tablet 2  . Multiple Vitamin (MULTIVITAMIN WITH MINERALS) TABS tablet Take 1 tablet by mouth daily. Centrum Silver    . spironolactone (ALDACTONE) 25 MG tablet Take 1 tablet (25 mg total) by mouth daily. 90 tablet 2  . warfarin (COUMADIN) 2.5 MG tablet TAKE 1 TABLET DAILY OR AS DIRECTED BY COUMADIN CLINIC 90 tablet 1    No current facility-administered medications for this visit.     PHYSICAL EXAMINATION: ECOG PERFORMANCE STATUS: 1 - Symptomatic but completely ambulatory  Vitals:   07/08/17 1447  BP: 132/66  Pulse: (!) 56  Resp: 17  Temp: (!) 97.5 F (36.4 C)  SpO2: 99%   Filed Weights   07/08/17 1447  Weight: 131 lb 8 oz (59.6 kg)    GENERAL:alert, no distress and comfortable SKIN: skin color, texture, turgor are normal, no rashes or significant lesions EYES: normal, Conjunctiva are pink and non-injected, sclera clear OROPHARYNX:no exudate, no erythema and lips, buccal mucosa, and tongue normal  NECK: supple, thyroid normal size, non-tender, without nodularity LYMPH:  no palpable lymphadenopathy in the cervical, axillary or inguinal LUNGS: clear to auscultation and percussion with normal breathing effort HEART: regular rate & rhythm and no murmurs and no lower extremity edema ABDOMEN:abdomen soft, non-tender and normal bowel sounds MUSCULOSKELETAL:no cyanosis of digits and no clubbing  NEURO: alert & oriented x 3 with fluent speech, no focal motor/sensory deficits EXTREMITIES: No lower extremity edema  LABORATORY DATA:  I have reviewed the data as listed CMP Latest Ref Rng & Units 10/08/2016 10/07/2016 10/06/2016  Glucose 65 - 99 mg/dL 90 98 107(H)  BUN 6 - 20 mg/dL 30(H) 35(H) 51(H)  Creatinine 0.44 - 1.00 mg/dL 0.95 0.91 1.42(H)  Sodium 135 - 145 mmol/L 140 140 140  Potassium 3.5 - 5.1 mmol/L 3.5 3.6 3.2(L)  Chloride 101 - 111 mmol/L 108 110 107  CO2 22 - 32 mmol/L 25 21(L) 22  Calcium 8.9 - 10.3 mg/dL 8.2(L) 8.3(L) 8.5(L)  Total Protein 6.5 - 8.1 g/dL - - -  Total Bilirubin 0.3 - 1.2 mg/dL - - -  Alkaline Phos 38 - 126 U/L - - -  AST 15 - 41 U/L - - -  ALT 14 - 54 U/L - - -    Lab Results  Component Value Date   WBC 5.9 07/08/2017   HGB 11.4 (L) 07/08/2017   HCT 35.6 07/08/2017   MCV 89.8 07/08/2017   PLT 235 07/08/2017   NEUTROABS 4.2 07/08/2017    ASSESSMENT &  PLAN:  Angioimmunoblastic lymphoma ([place]) Right axillary lymphadenopathy: Detected through with routine screening mammogram status post ultrasound-guided biopsy, 8 mm lymph nodes, atypical lymphoproliferative process suspicious for lymphoma, flow cytometry revealed excess CD4 positive cells suspicious for angioimmunoblastic T-cell lymphoma CT scans 03/29/15: Borderline bil axillary LN and inguinal LN Ct scans 07/06/15: Progressive bil Axillary and inguinal lymphadenopathy  Hospitalization: 11/19 to 04/19/15: Closed Rt Hip Fracture  Plan:  1. I discussed with her that given her advanced age, I do not recommend aggressive chemotherapy. 2. Options were surveillance versus prednisone therapy. We will continue to dosurveillance  Return to clinic in 6 months with CT chest abdomen pelvis and follow-up after that with labs.   I spent 25 minutes talking to the patient of which more than half was spent in counseling and coordination of care.  Orders Placed This Encounter  Procedures  . CT Abdomen Pelvis W Contrast    Standing Status:   Future    Standing Expiration Date:  07/08/2018    Order Specific Question:   If indicated for the ordered procedure, I authorize the administration of contrast media per Radiology protocol    Answer:   Yes    Order Specific Question:   Preferred imaging location?    Answer:   [facility]    Order Specific Question:   Radiology Contrast Protocol - do NOT remove file path    Answer:   [email or web address]    Order Specific Question:   Reason for Exam additional comments    Answer:   Lymphoma restaging  . CT Chest W Contrast    Standing Status:   Future    Standing Expiration Date:   07/08/2018    Order Specific Question:   If indicated for the ordered procedure, I authorize the administration of contrast media per Radiology protocol    Answer:   Yes    Order Specific Question:   Preferred imaging location?    Answer:   [facility]    Order Specific Question:   Radiology Contrast Protocol - do NOT remove file path    Answer:   [email or web address]    Order Specific Question:   Reason for Exam additional comments    Answer:   Lymphoma restaging  . CBC with Differential (Cancer Center Only)    Standing Status:   Future    Standing Expiration Date:   07/08/2018  . CMP ([place] only)    Standing Status:   Future    Standing Expiration Date:   07/08/2018   The patient has a good understanding of the overall plan. she agrees with it. she will call with any problems that may develop before the next visit here.   Harriette Ohara, MD 07/08/17

## 2017-07-08 NOTE — Assessment & Plan Note (Signed)
Right axillary lymphadenopathy: Detected through with routine screening mammogram status post ultrasound-guided biopsy, 8 mm lymph nodes, atypical lymphoproliferative process suspicious for lymphoma, flow cytometry revealed excess CD4 positive cells suspicious for angioimmunoblastic T-cell lymphoma CT scans 03/29/15: Borderline bil axillary LN and inguinal LN Ct scans 07/06/15: Progressive bil Axillary and inguinal lymphadenopathy  Hospitalization: 11/19 to 04/19/15: Closed Rt Hip Fracture  Plan:  1. I discussed with her that given her advanced age, I do not recommend aggressive chemotherapy. 2. Options were surveillance versus prednisone therapy. We will continue to dosurveillance  Patient wanted to get mammograms for surveillance. I suggested that mammograms are optional. If she wishes to undergo the mammograms she could call the breast center and schedule it.  Return to clinic in 1 year with CBC, CMP and LDH

## 2017-07-09 ENCOUNTER — Telehealth: Payer: Self-pay | Admitting: Hematology and Oncology

## 2017-07-09 NOTE — Telephone Encounter (Signed)
Mailed patient calendar of upcoming August appointments.  °

## 2017-07-21 ENCOUNTER — Other Ambulatory Visit: Payer: Self-pay | Admitting: Family Medicine

## 2017-07-29 ENCOUNTER — Ambulatory Visit (INDEPENDENT_AMBULATORY_CARE_PROVIDER_SITE_OTHER): Payer: Medicare Other | Admitting: Pharmacist

## 2017-07-29 DIAGNOSIS — Z7901 Long term (current) use of anticoagulants: Secondary | ICD-10-CM | POA: Diagnosis not present

## 2017-07-29 DIAGNOSIS — I480 Paroxysmal atrial fibrillation: Secondary | ICD-10-CM

## 2017-07-29 LAB — POCT INR: INR: 2.9

## 2017-08-27 ENCOUNTER — Other Ambulatory Visit: Payer: Self-pay | Admitting: Cardiovascular Disease

## 2017-08-27 NOTE — Telephone Encounter (Signed)
REFILL 

## 2017-08-28 ENCOUNTER — Other Ambulatory Visit: Payer: Self-pay | Admitting: Cardiovascular Disease

## 2017-08-29 ENCOUNTER — Other Ambulatory Visit: Payer: Self-pay | Admitting: Cardiovascular Disease

## 2017-09-08 ENCOUNTER — Ambulatory Visit (INDEPENDENT_AMBULATORY_CARE_PROVIDER_SITE_OTHER): Payer: Medicare Other | Admitting: Pharmacist Clinician (PhC)/ Clinical Pharmacy Specialist

## 2017-09-08 ENCOUNTER — Ambulatory Visit (INDEPENDENT_AMBULATORY_CARE_PROVIDER_SITE_OTHER): Payer: Medicare Other | Admitting: Cardiovascular Disease

## 2017-09-08 ENCOUNTER — Encounter: Payer: Self-pay | Admitting: Cardiovascular Disease

## 2017-09-08 VITALS — BP 124/72 | HR 70 | Ht 63.0 in | Wt 134.0 lb

## 2017-09-08 DIAGNOSIS — I5032 Chronic diastolic (congestive) heart failure: Secondary | ICD-10-CM | POA: Diagnosis not present

## 2017-09-08 DIAGNOSIS — Z7901 Long term (current) use of anticoagulants: Secondary | ICD-10-CM

## 2017-09-08 DIAGNOSIS — I2721 Secondary pulmonary arterial hypertension: Secondary | ICD-10-CM

## 2017-09-08 DIAGNOSIS — I2510 Atherosclerotic heart disease of native coronary artery without angina pectoris: Secondary | ICD-10-CM

## 2017-09-08 DIAGNOSIS — C8440 Peripheral T-cell lymphoma, not classified, unspecified site: Secondary | ICD-10-CM | POA: Diagnosis not present

## 2017-09-08 DIAGNOSIS — I350 Nonrheumatic aortic (valve) stenosis: Secondary | ICD-10-CM

## 2017-09-08 DIAGNOSIS — I480 Paroxysmal atrial fibrillation: Secondary | ICD-10-CM | POA: Diagnosis not present

## 2017-09-08 DIAGNOSIS — I4821 Permanent atrial fibrillation: Secondary | ICD-10-CM

## 2017-09-08 DIAGNOSIS — E7800 Pure hypercholesterolemia, unspecified: Secondary | ICD-10-CM | POA: Diagnosis not present

## 2017-09-08 DIAGNOSIS — I10 Essential (primary) hypertension: Secondary | ICD-10-CM

## 2017-09-08 DIAGNOSIS — I482 Chronic atrial fibrillation: Secondary | ICD-10-CM | POA: Diagnosis not present

## 2017-09-08 LAB — POCT INR: INR: 2.9

## 2017-09-08 NOTE — Progress Notes (Signed)
Cardiology Office Note    Date:  09/10/2017   ID:  KENTLEY CEDILLO, DOB 06-21-1923, MRN [id number]  PCP:  [name], MD  Cardiologist:   Sanda Klein, MD   Chief Complaint  Patient presents with  . Follow-up    6 months    History of Present Illness:  [name] is a 84 y.o. female with chronic diastolic heart failure, coronary artery disease, moderate aortic stenosis, persistent atrial fibrillation and hyperlipidemia returning for follow-up visit.  [name] has done remarkably well and has not had any further problems with heart failure exacerbation.  She is allergic to both thiazide and loop diuretics (severe skin rash and pruritus) and had intractable nausea with ethacrynic acid, so spironolactone is the only diuretic she is been able to take.  Thankfully she has been very well managed with this.  She has mild ankle edema that is easily controlled with compression stockings.  She is in permanent atrial fibrillation with good rate control and is unaware of any palpitations.  The patient specifically denies any chest pain at rest exertion, dyspnea at rest or with exertion, orthopnea, paroxysmal nocturnal dyspnea, syncope, palpitations, focal neurological deficits, intermittent claudication, lower extremity edema, unexplained weight gain, cough, hemoptysis or wheezing.  Does not have any serious bleeding complications on chronic warfarin anticoagulation.  Her low-grade lymphoma still being treated with watchful waiting.  She has coronary artery disease and received a bare-metal stent (3x18 mm integrity) to the right coronary in 2010. She has not had significant angina pectoris since.  She has diastolic heart failure with a remote acute exacerbation in June 2014 and March 2016. Her echo shows an ejection fraction of 55-60% and pseudo-normal mitral inflow.  She has moderate aortic stenosis (estimated valve area 1.2 cm square, mean gradient 18 mm Hg, echo Sep 2016). She had  recurrent persistent atrial fibrillation requiring multiple cardioversions, failed amiodarone therapy. She has treated hyperlipidemia, hypertension and hypothyroidism. 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  Past Medical History:  Diagnosis Date  .  Acute respiratiory failure requiring BiPap 06/24/2011  . Allergy   . Aortic valvular stenosis, moderate to severe 06/24/2011   a. 10/2012 Echo: mod AS, Valve 1.25 cm^2 (VTI), 1.19cm^2 (Vmax).  . Arthritis    "back" (10/30/2012)  . Chronic diastolic CHF (congestive heart failure) ([place])    a. 10/2012 Echo: EF 55-60%, no rwma, Gr 2 DD, moderate AS, mod dil LA, mildly to mod dil RA.  Marland Kitchen Coronary artery disease    a. 03/2009 PCI RCA (3.0x18 BMS).  . Dyslipidemia   . Exertional shortness of breath   . Hypertension   . Hypothyroidism   . PAF (paroxysmal atrial fibrillation) ([place])    a. CHA2DS2VASc = 7-->chronic coumadin;  b. s/p DCCV 05/2011, 05/2012, 07/2014;  c. On amio.  . Rash    a. felt to be 2/2 lasix/torsemide in setting of sulfa allergy (lasix d/c ~ 06/2014, torsemide d/c 08/09/2014).    Past Surgical History:  Procedure Laterality Date  . ANGIOPLASTY    . APPENDECTOMY    . BACK SURGERY    . CARDIAC CATHETERIZATION  10/06/2009   Continue medical therapy  . CARDIAC CATHETERIZATION  04/07/2009   RCA stented with a 3x52mm stent resulting in a reduction of 90% narrowing to normal  . CARDIOVERSION  06/19/2011   Procedure: CARDIOVERSION;  Surgeon: Sanda Klein, MD;  Location: [facility];  Service: Cardiovascular;  Laterality: N/A;  . CARDIOVERSION  07/11/2010  Successful coversion to sinus rhythm  . CARDIOVERSION N/A 08/10/2014   Procedure: CARDIOVERSION;  Surgeon: Sanda Klein, MD;  Location: [facility];  Service: Cardiovascular;  Laterality: N/A;  . CATARACT EXTRACTION W/ INTRAOCULAR LENS  IMPLANT, BILATERAL    . CORONARY ANGIOPLASTY WITH STENT PLACEMENT     "1" (10/30/2012)  . DILATION AND CURETTAGE OF UTERUS     . EXCISIONAL HEMORRHOIDECTOMY    . FRACTURE SURGERY    . HIP ARTHROPLASTY Right 04/17/2015   Procedure: HIP MONOPOLAR HIP HEMI ARTHROPLASTY;  Surgeon: Marybelle Killings, MD;  Location: [facility];  Service: Orthopedics;  Laterality: Right;  . LUMBAR DISC SURGERY  X 2   "cleaned out scar tissue"   . SHOULDER ACROMIOPLASTY Left    "fell; put a new ball in" (10/30/2012)  . WRIST FRACTURE SURGERY Left     Current Medications: Outpatient Medications Prior to Visit  Medication Sig Dispense Refill  . acetaminophen (TYLENOL) 650 MG CR tablet Take 650 mg by mouth 3 (three) times daily. Morning, supper and bedtime    . atorvastatin (LIPITOR) 40 MG tablet Take 1 tablet (40 mg total) by mouth daily. PT OVERDUE FOR OV MUST KEEP APPT FOR FUTURE REFILLS 90 tablet 0  . folic acid (FOLVITE) 1 MG tablet Take 2 tablets (2 mg total) by mouth daily. 180 tablet 3  . ipratropium (ATROVENT) 0.03 % nasal spray Place 2 sprays into both nostrils 2 (two) times daily. Use as directed 30 mL 5  . levothyroxine (SYNTHROID, LEVOTHROID) 88 MCG tablet TAKE 1 TABLET DAILY BEFORE BREAKFAST 90 tablet 0  . magnesium hydroxide (MILK OF MAGNESIA) 400 MG/5ML suspension Take 15 mLs by mouth daily as needed for mild constipation. 360 mL 0  . metoprolol tartrate (LOPRESSOR) 25 MG tablet TAKE 1 TABLET TWICE A DAY 180 tablet 1  . Multiple Vitamin (MULTIVITAMIN WITH MINERALS) TABS tablet Take 1 tablet by mouth daily. Centrum Silver    . spironolactone (ALDACTONE) 25 MG tablet Take 1 tablet (25 mg total) by mouth daily. 90 tablet 2  . warfarin (COUMADIN) 2.5 MG tablet TAKE 1 TABLET DAILY OR AS DIRECTED BY COUMADIN CLINIC 90 tablet 1  . albuterol (PROVENTIL HFA;VENTOLIN HFA) 108 (90 Base) MCG/ACT inhaler Inhale 2 puffs into the lungs every 6 (six) hours as needed for wheezing or shortness of breath. 1 Inhaler 0  . amoxicillin (AMOXIL) 500 MG tablet Take 1 tablet (500 mg total) by mouth 3 (three) times daily. 30 tablet 0  . benzonatate (TESSALON  PERLES) 100 MG capsule Take 1 capsule (100 mg total) by mouth every 6 (six) hours as needed for cough. 30 capsule 0  . fluticasone furoate-vilanterol (BREO ELLIPTA) 100-25 MCG/INH AEPB Inhale 1 puff into the lungs daily. 1 each 0  . metoCLOPramide (REGLAN) 5 MG tablet Take 1 tablet (5 mg total) by mouth 3 (three) times daily before meals. 12 tablet 0   No facility-administered medications prior to visit.      Allergies:   [name] [ethacrynic acid]; Tape; Furosemide; Sulfa antibiotics; [name]; and Torsemide   Social History   Socioeconomic History  . Marital status: Married    Spouse name: Not on file  . Number of children: 1  . Years of education: Not on file  . Highest education level: Not on file  Occupational History  . Occupation: Retired    Fish farm manager: [employer]  Social Needs  . Financial resource strain: Not on file  . Food insecurity:    Worry: Not on file  Inability: Not on file  . Transportation needs:    Medical: Not on file    Non-medical: Not on file  Tobacco Use  . Smoking status: Never Smoker  . Smokeless tobacco: Never Used  Substance and Sexual Activity  . Alcohol use: No  . Drug use: No  . Sexual activity: Never    Birth control/protection: Abstinence  Lifestyle  . Physical activity:    Days per week: Not on file    Minutes per session: Not on file  . Stress: Not on file  Relationships  . Social connections:    Talks on phone: Not on file    Gets together: Not on file    Attends religious service: Not on file    Active member of club or organization: Not on file    Attends meetings of clubs or organizations: Not on file    Relationship status: Not on file  Other Topics Concern  . Not on file  Social History Narrative   Lives in [place], [place]. She got married in 1948.     Family History:  The patient's family history includes Alcohol abuse in her child; Cancer in her son; Cancer (age of onset: 67) in her mother; Heart  attack in her father; Heart disease in her brother, brother, and father; Heart disease (age of onset: 28) in her mother; Heart failure in her mother; Hypertension in her brother, father, mother, and sister; Hypertension (age of onset: 53) in her child; Lung disease in her brother; Stroke (age of onset: 70) in her mother.   ROS:   Please see the history of present illness.    ROS All other systems reviewed and are negative.   PHYSICAL EXAM:   VS:  BP 124/72 (BP Location: Left Arm, Patient Position: Sitting, Cuff Size: Normal)   Pulse 70   Ht 5\' 3"  (1.6 m)   Wt 134 lb (60.8 kg)   BMI 23.74 kg/m     General: Alert, oriented x3, no distress, lean, smiling. Head: no evidence of trauma, PERRL, EOMI, no exophtalmos or lid lag, no myxedema, no xanthelasma; normal ears, nose and oropharynx Neck: normal jugular venous pulsations and no hepatojugular reflux; brisk carotid pulses without delay and no carotid bruits Chest: clear to auscultation, no signs of consolidation by percussion or palpation, normal fremitus, symmetrical and full respiratory excursions Cardiovascular: normal position and quality of the apical impulse, irregular rhythm, normal first and paradoxically split second heart sounds, 2-3/6 early peaking systolic ejection murmur, no diastolic murmurs, rubs or gallops Abdomen: no tenderness or distention, no masses by palpation, no abnormal pulsatility or arterial bruits, normal bowel sounds, no hepatosplenomegaly Extremities: no clubbing, cyanosis or edema (compression stockings); 2+ radial, ulnar and brachial pulses bilaterally; 2+ right femoral, posterior tibial and dorsalis pedis pulses; 2+ left femoral, posterior tibial and dorsalis pedis pulses; no subclavian or femoral bruits Neurological: grossly nonfocal Psych: Normal mood and affect   Wt Readings from Last 3 Encounters:  09/08/17 134 lb (60.8 kg)  07/08/17 131 lb 8 oz (59.6 kg)  06/12/17 129 lb 9.6 oz (58.8 kg)       Studies/Labs Reviewed:   EKG:  EKG is ordered today.  The ekg ordered today demonstrates atrial fibrillation, left bundle branch block, QRS 134 ms, QTC 468 ms  Recent Labs: 10/02/2016: B Natriuretic Peptide 317.8; TSH 4.760 10/07/2016: Magnesium 2.4 07/08/2017: ALT 12; BUN 18; Creatinine, Ser 1.13; Hemoglobin 11.4; Platelets 235; Potassium 4.3; Sodium 139   Lipid Panel  Component Value Date/Time   CHOL 156 05/10/2016 0958   TRIG 141 05/10/2016 0958   HDL 40 (L) 05/10/2016 0958   CHOLHDL 3.9 05/10/2016 0958   VLDL 28 05/10/2016 0958   LDLCALC 88 05/10/2016 0958   ASSESSMENT:    1. Chronic diastolic heart failure ([place])   2. Permanent atrial fibrillation ([place])   3. On anticoagulant therapy   4. Aortic valve stenosis, nonrheumatic   5. PAH (pulmonary artery hypertension) ([place])   6. Coronary artery disease involving native coronary artery of native heart without angina pectoris   7. Hypercholesterolemia   8. Essential hypertension   9. Angioimmunoblastic lymphoma ([place])      PLAN:  In order of problems listed above:  1. CHF: She is very disciplined with sodium intake and is able to manage her heart failure with spironolactone as the only diuretic.  NYHA functional class II.  No overt hypervolemia.  It is possible that she has TTR amyloidosis 2. AFib: The arrhythmia is asymptomatic and well rate controlled, appropriately anticoagulated for CHADSVasc 6 (age 12, gender, HTN, CHF, CAD). 3. Warfarin: Well-tolerated without bleeding problems and usually in therapeutic range 4. AS: Asymptomatic.  Moderate by her most recent echo performed in May 2018 with a mean gradient of 23 mmHg. 5. PAH: Assumably this is related to diastolic heart failure. 6. CAD: Asymptomatic for the last 9 years.  On statin. 7. HLP: Ideally would like her LDL under 70, but she wants to avoid a lengthy list of medications so we stopped ezetimibe.  Similarly, she is not interested in PCS canine inhibitors current  LDL under 100 is acceptable 8. HTN: Blood pressure in appropriate range 9. 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 Lindi Adie).    Medication Adjustments/Labs and Tests Ordered: Current medicines are reviewed at length with the patient today.  Concerns regarding medicines are outlined above.  Medication changes, Labs and Tests ordered today are listed in the Patient Instructions below. Patient Instructions  Dr Sallyanne Kuster recommends that you schedule a follow-up appointment in 12 months. You will receive a reminder letter in the mail two months in advance. If you don't receive a letter, please call our office to schedule the follow-up appointment.  If you need a refill on your cardiac medications before your next appointment, please call your pharmacy.    Signed, Sanda Klein, MD  09/10/2017 4:47 PM    [address], [place], [place]  [address] Phone: [phone]; Fax: [phone]

## 2017-09-08 NOTE — Patient Instructions (Signed)
Dr Croitoru recommends that you schedule a follow-up appointment in 12 months. You will receive a reminder letter in the mail two months in advance. If you don't receive a letter, please call our office to schedule the follow-up appointment.  If you need a refill on your cardiac medications before your next appointment, please call your pharmacy. 

## 2017-09-08 NOTE — Patient Instructions (Signed)
Description   Continue taking 1 tablet daily except 1/2 tablet every Friday; Repeat INR in 4 weeks.

## 2017-09-10 ENCOUNTER — Encounter: Payer: Self-pay | Admitting: Cardiovascular Disease

## 2017-10-06 ENCOUNTER — Ambulatory Visit (INDEPENDENT_AMBULATORY_CARE_PROVIDER_SITE_OTHER): Payer: Medicare Other | Admitting: Pharmacist

## 2017-10-06 DIAGNOSIS — Z7901 Long term (current) use of anticoagulants: Secondary | ICD-10-CM | POA: Diagnosis not present

## 2017-10-06 DIAGNOSIS — I480 Paroxysmal atrial fibrillation: Secondary | ICD-10-CM | POA: Diagnosis not present

## 2017-10-06 LAB — POCT INR: INR: 3.9

## 2017-10-19 ENCOUNTER — Other Ambulatory Visit: Payer: Self-pay | Admitting: Family Medicine

## 2017-10-21 ENCOUNTER — Ambulatory Visit (INDEPENDENT_AMBULATORY_CARE_PROVIDER_SITE_OTHER): Payer: Medicare Other | Admitting: Pharmacist Clinician (PhC)/ Clinical Pharmacy Specialist

## 2017-10-21 DIAGNOSIS — I4891 Unspecified atrial fibrillation: Secondary | ICD-10-CM | POA: Diagnosis not present

## 2017-10-21 DIAGNOSIS — Z7901 Long term (current) use of anticoagulants: Secondary | ICD-10-CM | POA: Diagnosis not present

## 2017-10-21 DIAGNOSIS — I480 Paroxysmal atrial fibrillation: Secondary | ICD-10-CM

## 2017-10-21 LAB — POCT INR: INR: 3 (ref 2.0–3.0)

## 2017-10-21 NOTE — Telephone Encounter (Signed)
Pt has not had tsh checked in  a year. Is it ok to fill pt thyroid med. [place]

## 2017-10-21 NOTE — Telephone Encounter (Signed)
Called pt and made appt. [name]

## 2017-10-21 NOTE — Telephone Encounter (Signed)
She needs an appointment.

## 2017-10-23 ENCOUNTER — Ambulatory Visit (INDEPENDENT_AMBULATORY_CARE_PROVIDER_SITE_OTHER): Payer: Medicare Other | Admitting: Family Medicine

## 2017-10-23 ENCOUNTER — Encounter: Payer: Self-pay | Admitting: Family Medicine

## 2017-10-23 VITALS — BP 106/78 | HR 96 | Temp 97.9°F | Wt 136.6 lb

## 2017-10-23 DIAGNOSIS — I5032 Chronic diastolic (congestive) heart failure: Secondary | ICD-10-CM

## 2017-10-23 DIAGNOSIS — I2510 Atherosclerotic heart disease of native coronary artery without angina pectoris: Secondary | ICD-10-CM

## 2017-10-23 DIAGNOSIS — I110 Hypertensive heart disease with heart failure: Secondary | ICD-10-CM | POA: Diagnosis not present

## 2017-10-23 DIAGNOSIS — I480 Paroxysmal atrial fibrillation: Secondary | ICD-10-CM | POA: Diagnosis not present

## 2017-10-23 DIAGNOSIS — E071 Dyshormogenetic goiter: Secondary | ICD-10-CM

## 2017-10-23 DIAGNOSIS — E049 Nontoxic goiter, unspecified: Secondary | ICD-10-CM

## 2017-10-23 DIAGNOSIS — C8440 Peripheral T-cell lymphoma, not classified, unspecified site: Secondary | ICD-10-CM

## 2017-10-23 NOTE — Addendum Note (Signed)
Addended by: [name] on: 10/23/2017 03:46 PM   Modules accepted: Orders

## 2017-10-23 NOTE — Progress Notes (Signed)
  Subjective:     Patient ID: [name], female   DOB: [birth year], 84 y.o.   MRN: [id number]  HPI Pt presents for a follow up TSH lab. She also c/o a 2 month history of a "nasal drip" with clear secretions. States it gets worse when she goes outside. Pt has tried Claritin without relief. No cough, fevers, chills, shortness of breath, dizziness or chest pain. She checks her blood pressure 1x/week and has readings in the 100s/70s. Reports taking all her medications as prescribed. Has regular INR checks to her cardiologist, no bleeding.  She does have a history of CHF as well as PAF assisted she also is scheduled to see her oncologist in July for follow-up on so tried Claritin blood pressure I think what I do is I put in here canals no bleeding no not take out any  Review of Systems     Objective:   Physical Exam Alert and in no distress otherwise not examined    Assessment:    Angioimmunoblastic lymphoma ([place])  Hypothyroidism, goitrous  Hypertensive heart disease with chronic diastolic congestive heart failure ([place])  Coronary artery disease involving native coronary artery of native heart without angina pectoris  PAF (paroxysmal atrial fibrillation) ([place])       Plan:      She will continue on her present medication regimen.  I will follow-up with TSH and adjust her thyroid appropriately.  She will continue to be followed by cardiology as well as oncology Recommend she switch from Claritin to Allegra to see if this will help with her rhinorrhea

## 2017-10-24 LAB — TSH: TSH: 0.311 u[IU]/mL — AB (ref 0.450–4.500)

## 2017-10-24 MED ORDER — LEVOTHYROXINE SODIUM 88 MCG PO TABS: 88.0000 ug | ORAL_TABLET | Freq: Every day | ORAL | 3 refills | Status: DC

## 2017-10-24 NOTE — Addendum Note (Signed)
Addended by: [name] on: 10/24/2017 04:53 PM   Modules accepted: Orders

## 2017-11-03 ENCOUNTER — Ambulatory Visit (INDEPENDENT_AMBULATORY_CARE_PROVIDER_SITE_OTHER): Payer: Medicare Other | Admitting: Pharmacist Clinician (PhC)/ Clinical Pharmacy Specialist

## 2017-11-03 DIAGNOSIS — Z7901 Long term (current) use of anticoagulants: Secondary | ICD-10-CM | POA: Diagnosis not present

## 2017-11-03 DIAGNOSIS — I480 Paroxysmal atrial fibrillation: Secondary | ICD-10-CM

## 2017-11-03 LAB — POCT INR: INR: 3.2 — AB (ref 2.0–3.0)

## 2017-11-03 NOTE — Patient Instructions (Signed)
Description   No warfarin today Monday June 10, the continue taking 1 tablet daily except 1/2 tablet every Friday; Repeat INR in 4 weeks.

## 2017-11-06 ENCOUNTER — Other Ambulatory Visit: Payer: Self-pay | Admitting: Cardiovascular Disease

## 2017-11-19 ENCOUNTER — Other Ambulatory Visit: Payer: Self-pay | Admitting: Cardiovascular Disease

## 2017-11-26 ENCOUNTER — Other Ambulatory Visit: Payer: Self-pay | Admitting: Cardiovascular Disease

## 2017-12-01 ENCOUNTER — Ambulatory Visit (INDEPENDENT_AMBULATORY_CARE_PROVIDER_SITE_OTHER): Payer: Medicare Other | Admitting: Pharmacist Clinician (PhC)/ Clinical Pharmacy Specialist

## 2017-12-01 DIAGNOSIS — Z7901 Long term (current) use of anticoagulants: Secondary | ICD-10-CM | POA: Diagnosis not present

## 2017-12-01 DIAGNOSIS — I480 Paroxysmal atrial fibrillation: Secondary | ICD-10-CM

## 2017-12-01 LAB — POCT INR: INR: 2.3 (ref 2.0–3.0)

## 2017-12-23 ENCOUNTER — Emergency Department (HOSPITAL_COMMUNITY): Payer: Medicare Other

## 2017-12-23 ENCOUNTER — Observation Stay (HOSPITAL_COMMUNITY)
Admission: EM | Admit: 2017-12-23 | Discharge: 2017-12-25 | Disposition: A | Discharge status: Home / Self Care | Payer: Medicare Other | Attending: Internal Medicine | Admitting: Internal Medicine

## 2017-12-23 ENCOUNTER — Other Ambulatory Visit: Payer: Self-pay

## 2017-12-23 ENCOUNTER — Encounter (HOSPITAL_COMMUNITY): Payer: Self-pay | Admitting: Emergency Medicine

## 2017-12-23 DIAGNOSIS — Z79890 Hormone replacement therapy (postmenopausal): Secondary | ICD-10-CM | POA: Diagnosis not present

## 2017-12-23 DIAGNOSIS — K8020 Calculus of gallbladder without cholecystitis without obstruction: Secondary | ICD-10-CM | POA: Diagnosis not present

## 2017-12-23 DIAGNOSIS — I110 Hypertensive heart disease with heart failure: Secondary | ICD-10-CM | POA: Diagnosis not present

## 2017-12-23 DIAGNOSIS — Z7901 Long term (current) use of anticoagulants: Secondary | ICD-10-CM | POA: Insufficient documentation

## 2017-12-23 DIAGNOSIS — D72829 Elevated white blood cell count, unspecified: Secondary | ICD-10-CM | POA: Diagnosis present

## 2017-12-23 DIAGNOSIS — J690 Pneumonitis due to inhalation of food and vomit: Secondary | ICD-10-CM | POA: Diagnosis present

## 2017-12-23 DIAGNOSIS — Z9841 Cataract extraction status, right eye: Secondary | ICD-10-CM | POA: Insufficient documentation

## 2017-12-23 DIAGNOSIS — E071 Dyshormogenetic goiter: Secondary | ICD-10-CM | POA: Diagnosis not present

## 2017-12-23 DIAGNOSIS — Z955 Presence of coronary angioplasty implant and graft: Secondary | ICD-10-CM | POA: Insufficient documentation

## 2017-12-23 DIAGNOSIS — I272 Pulmonary hypertension, unspecified: Secondary | ICD-10-CM | POA: Insufficient documentation

## 2017-12-23 DIAGNOSIS — I700 Atherosclerosis of aorta: Secondary | ICD-10-CM | POA: Insufficient documentation

## 2017-12-23 DIAGNOSIS — I480 Paroxysmal atrial fibrillation: Secondary | ICD-10-CM

## 2017-12-23 DIAGNOSIS — N179 Acute kidney failure, unspecified: Secondary | ICD-10-CM | POA: Diagnosis not present

## 2017-12-23 DIAGNOSIS — I2510 Atherosclerotic heart disease of native coronary artery without angina pectoris: Secondary | ICD-10-CM | POA: Insufficient documentation

## 2017-12-23 DIAGNOSIS — E860 Dehydration: Secondary | ICD-10-CM | POA: Insufficient documentation

## 2017-12-23 DIAGNOSIS — M1990 Unspecified osteoarthritis, unspecified site: Secondary | ICD-10-CM | POA: Diagnosis not present

## 2017-12-23 DIAGNOSIS — Z79899 Other long term (current) drug therapy: Secondary | ICD-10-CM | POA: Diagnosis not present

## 2017-12-23 DIAGNOSIS — K529 Noninfective gastroenteritis and colitis, unspecified: Principal | ICD-10-CM | POA: Insufficient documentation

## 2017-12-23 DIAGNOSIS — Z9842 Cataract extraction status, left eye: Secondary | ICD-10-CM | POA: Insufficient documentation

## 2017-12-23 DIAGNOSIS — E039 Hypothyroidism, unspecified: Secondary | ICD-10-CM | POA: Insufficient documentation

## 2017-12-23 DIAGNOSIS — I350 Nonrheumatic aortic (valve) stenosis: Secondary | ICD-10-CM | POA: Diagnosis not present

## 2017-12-23 DIAGNOSIS — Z882 Allergy status to sulfonamides status: Secondary | ICD-10-CM | POA: Diagnosis not present

## 2017-12-23 DIAGNOSIS — I5032 Chronic diastolic (congestive) heart failure: Secondary | ICD-10-CM | POA: Insufficient documentation

## 2017-12-23 DIAGNOSIS — E785 Hyperlipidemia, unspecified: Secondary | ICD-10-CM | POA: Insufficient documentation

## 2017-12-23 DIAGNOSIS — Z888 Allergy status to other drugs, medicaments and biological substances status: Secondary | ICD-10-CM | POA: Diagnosis not present

## 2017-12-23 DIAGNOSIS — R531 Weakness: Secondary | ICD-10-CM

## 2017-12-23 DIAGNOSIS — Z96612 Presence of left artificial shoulder joint: Secondary | ICD-10-CM | POA: Insufficient documentation

## 2017-12-23 DIAGNOSIS — Z823 Family history of stroke: Secondary | ICD-10-CM | POA: Insufficient documentation

## 2017-12-23 DIAGNOSIS — J449 Chronic obstructive pulmonary disease, unspecified: Secondary | ICD-10-CM | POA: Insufficient documentation

## 2017-12-23 DIAGNOSIS — Z96641 Presence of right artificial hip joint: Secondary | ICD-10-CM | POA: Insufficient documentation

## 2017-12-23 DIAGNOSIS — R112 Nausea with vomiting, unspecified: Secondary | ICD-10-CM | POA: Diagnosis present

## 2017-12-23 DIAGNOSIS — Z8249 Family history of ischemic heart disease and other diseases of the circulatory system: Secondary | ICD-10-CM | POA: Insufficient documentation

## 2017-12-23 LAB — CBC WITH DIFFERENTIAL/PLATELET
Abs Immature Granulocytes: 0.1 10*3/uL (ref 0.0–0.1)
BASOS ABS: 0.1 10*3/uL (ref 0.0–0.1)
Basophils Relative: 0 %
EOS ABS: 0 10*3/uL (ref 0.0–0.7)
EOS PCT: 0 %
HEMATOCRIT: 43.9 % (ref 36.0–46.0)
HEMOGLOBIN: 13.7 g/dL (ref 12.0–15.0)
IMMATURE GRANULOCYTES: 0 %
LYMPHS ABS: 0.2 10*3/uL — AB (ref 0.7–4.0)
LYMPHS PCT: 1 %
MCH: 29.5 pg (ref 26.0–34.0)
MCHC: 31.2 g/dL (ref 30.0–36.0)
MCV: 94.4 fL (ref 78.0–100.0)
Monocytes Absolute: 1.5 10*3/uL — ABNORMAL HIGH (ref 0.1–1.0)
Monocytes Relative: 8 %
NEUTROS PCT: 91 %
Neutro Abs: 17.9 10*3/uL — ABNORMAL HIGH (ref 1.7–7.7)
Platelets: 215 10*3/uL (ref 150–400)
RBC: 4.65 MIL/uL (ref 3.87–5.11)
RDW: 13.2 % (ref 11.5–15.5)
WBC: 19.7 10*3/uL — AB (ref 4.0–10.5)

## 2017-12-23 LAB — TROPONIN I
TROPONIN I: 0.03 ng/mL — AB (ref <0.03)
Troponin I: <0.03 ng/mL (ref <0.03)

## 2017-12-23 LAB — HEPATIC FUNCTION PANEL
ALK PHOS: 85 U/L (ref 38–126)
ALT: 35 U/L (ref 0–44)
AST: 40 U/L (ref 15–41)
Albumin: 4.4 g/dL (ref 3.5–5.0)
BILIRUBIN DIRECT: 0.2 mg/dL (ref 0.0–0.2)
BILIRUBIN INDIRECT: 1 mg/dL — AB (ref 0.3–0.9)
BILIRUBIN TOTAL: 1.2 mg/dL (ref 0.3–1.2)
Total Protein: 8.3 g/dL — ABNORMAL HIGH (ref 6.5–8.1)

## 2017-12-23 LAB — PROTIME-INR
INR: 2.4
Prothrombin Time: 25.9 seconds — ABNORMAL HIGH (ref 11.4–15.2)

## 2017-12-23 LAB — BASIC METABOLIC PANEL
ANION GAP: 15 (ref 5–15)
BUN: 32 mg/dL — AB (ref 8–23)
CO2: 23 mmol/L (ref 22–32)
Calcium: 9.6 mg/dL (ref 8.9–10.3)
Chloride: 103 mmol/L (ref 98–111)
Creatinine, Ser: 1.45 mg/dL — ABNORMAL HIGH (ref 0.44–1.00)
GFR, EST AFRICAN AMERICAN: 35 mL/min — AB (ref >60)
GFR, EST NON AFRICAN AMERICAN: 30 mL/min — AB (ref >60)
GLUCOSE: 138 mg/dL — AB (ref 70–99)
Potassium: 4.4 mmol/L (ref 3.5–5.1)
SODIUM: 141 mmol/L (ref 135–145)

## 2017-12-23 LAB — MAGNESIUM: Magnesium: 2.3 mg/dL (ref 1.7–2.4)

## 2017-12-23 LAB — I-STAT TROPONIN, ED: Troponin i, poc: 0.02 ng/mL (ref 0.00–0.08)

## 2017-12-23 MED ORDER — ATORVASTATIN CALCIUM 40 MG PO TABS
40.0000 mg | ORAL_TABLET | Freq: Every day | ORAL | Status: DC
  Administered 2017-12-24: 40 mg via ORAL
  Filled 2017-12-23: qty 1

## 2017-12-23 MED ORDER — LACTATED RINGERS IV BOLUS
500.0000 mL | Freq: Once | INTRAVENOUS | Status: AC
  Administered 2017-12-23: 500 mL via INTRAVENOUS

## 2017-12-23 MED ORDER — FOLIC ACID 1 MG PO TABS
2.0000 mg | ORAL_TABLET | Freq: Every day | ORAL | Status: DC
  Administered 2017-12-23 – 2017-12-25 (×3): 2 mg via ORAL
  Filled 2017-12-23 (×3): qty 2

## 2017-12-23 MED ORDER — ADULT MULTIVITAMIN W/MINERALS CH
1.0000 | ORAL_TABLET | Freq: Every day | ORAL | Status: DC
  Administered 2017-12-23 – 2017-12-25 (×3): 1 via ORAL
  Filled 2017-12-23 (×3): qty 1

## 2017-12-23 MED ORDER — WARFARIN - PHARMACIST DOSING INPATIENT: Freq: Every day | Status: DC

## 2017-12-23 MED ORDER — METOPROLOL TARTRATE 25 MG PO TABS
25.0000 mg | ORAL_TABLET | Freq: Two times a day (BID) | ORAL | Status: DC
  Administered 2017-12-23 – 2017-12-25 (×4): 25 mg via ORAL
  Filled 2017-12-23 (×4): qty 1

## 2017-12-23 MED ORDER — LORATADINE 10 MG PO TABS
10.0000 mg | ORAL_TABLET | Freq: Every day | ORAL | Status: DC
  Administered 2017-12-23 – 2017-12-25 (×3): 10 mg via ORAL
  Filled 2017-12-23 (×3): qty 1

## 2017-12-23 MED ORDER — AZELASTINE HCL 0.1 % NA SOLN
1.0000 | Freq: Two times a day (BID) | NASAL | Status: DC
  Administered 2017-12-23 – 2017-12-25 (×4): 1 via NASAL
  Filled 2017-12-23: qty 30

## 2017-12-23 MED ORDER — MAGNESIUM HYDROXIDE 400 MG/5ML PO SUSP
15.0000 mL | Freq: Every day | ORAL | Status: DC | PRN
Start: 2017-12-23 — End: 2017-12-25

## 2017-12-23 MED ORDER — SPIRONOLACTONE 25 MG PO TABS
25.0000 mg | ORAL_TABLET | Freq: Every day | ORAL | Status: DC
  Administered 2017-12-23 – 2017-12-24 (×2): 25 mg via ORAL
  Filled 2017-12-23 (×2): qty 1

## 2017-12-23 MED ORDER — ACETAMINOPHEN 650 MG RE SUPP: 650.0000 mg | Freq: Four times a day (QID) | RECTAL | Status: DC | PRN

## 2017-12-23 MED ORDER — ONDANSETRON HCL 4 MG/2ML IJ SOLN
4.0000 mg | Freq: Once | INTRAMUSCULAR | Status: AC
  Administered 2017-12-23: 4 mg via INTRAVENOUS
  Filled 2017-12-23: qty 2

## 2017-12-23 MED ORDER — METRONIDAZOLE IN NACL 5-0.79 MG/ML-% IV SOLN: 500.0000 mg | Freq: Three times a day (TID) | INTRAVENOUS | Status: DC

## 2017-12-23 MED ORDER — ACETAMINOPHEN 325 MG PO TABS: 650.0000 mg | ORAL_TABLET | Freq: Four times a day (QID) | ORAL | Status: DC | PRN

## 2017-12-23 MED ORDER — PROCHLORPERAZINE EDISYLATE 10 MG/2ML IJ SOLN
10.0000 mg | Freq: Four times a day (QID) | INTRAMUSCULAR | Status: DC | PRN
  Filled 2017-12-23: qty 2

## 2017-12-23 MED ORDER — IPRATROPIUM BROMIDE 0.03 % NA SOLN
2.0000 | Freq: Two times a day (BID) | NASAL | Status: DC
  Filled 2017-12-23: qty 30

## 2017-12-23 MED ORDER — SODIUM CHLORIDE 0.9 % IV SOLN
INTRAVENOUS | Status: DC
  Administered 2017-12-23: 21:00:00 via INTRAVENOUS

## 2017-12-23 MED ORDER — LEVOTHYROXINE SODIUM 88 MCG PO TABS
88.0000 ug | ORAL_TABLET | Freq: Every day | ORAL | Status: DC
  Administered 2017-12-24 – 2017-12-25 (×2): 88 ug via ORAL
  Filled 2017-12-23 (×2): qty 1

## 2017-12-23 MED ORDER — PIPERACILLIN-TAZOBACTAM 3.375 G IVPB 30 MIN
3.3750 g | Freq: Once | INTRAVENOUS | Status: AC
  Administered 2017-12-23: 3.375 g via INTRAVENOUS
  Filled 2017-12-23: qty 50

## 2017-12-23 MED ORDER — PIPERACILLIN-TAZOBACTAM 3.375 G IVPB
3.3750 g | Freq: Three times a day (TID) | INTRAVENOUS | Status: DC
  Administered 2017-12-24 – 2017-12-25 (×4): 3.375 g via INTRAVENOUS
  Filled 2017-12-23 (×5): qty 50

## 2017-12-23 MED ORDER — FAMOTIDINE IN NACL 20-0.9 MG/50ML-% IV SOLN
20.0000 mg | Freq: Once | INTRAVENOUS | Status: AC
  Administered 2017-12-23: 20 mg via INTRAVENOUS
  Filled 2017-12-23: qty 50

## 2017-12-23 MED ORDER — FLUTICASONE PROPIONATE 50 MCG/ACT NA SUSP
2.0000 | Freq: Every day | NASAL | Status: DC
  Administered 2017-12-24 – 2017-12-25 (×2): 2 via NASAL
  Filled 2017-12-23: qty 16

## 2017-12-23 NOTE — ED Provider Notes (Signed)
[facility] EMERGENCY DEPARTMENT Provider Note   CSN: [id number] Arrival date & time: 12/23/17  1457     History   Chief Complaint No chief complaint on file.   HPI [name] is a 84 y.o. female with coronary artery disease, hyperlipidemia, hypothyroidism, hypertension, and A. fib on Coumadin and metoprolol who presents due to nausea and vomiting.  She reports that she has vomited approximately 4 times prior to arrival.  She says that she feels weak all over.  She denies having any specific pain.  She denies chest pain, headache, shortness of breath, abdominal pain, and dysuria.  She is not sure why she has been vomiting.  Her family reports that one episode of emesis seemed dark brown.  The patient believes that this is because she vomited up the coffee that she had just had.  She denies melena.  She also denies having any dysuria.  HPI  Past Medical History:  Diagnosis Date  .  Acute respiratiory failure requiring BiPap 06/24/2011  . Allergy   . Aortic valvular stenosis, moderate to severe 06/24/2011   a. 10/2012 Echo: mod AS, Valve 1.25 cm^2 (VTI), 1.19cm^2 (Vmax).  . Arthritis    "back" (10/30/2012)  . Chronic diastolic CHF (congestive heart failure) ([place])    a. 10/2012 Echo: EF 55-60%, no rwma, Gr 2 DD, moderate AS, mod dil LA, mildly to mod dil RA.  Marland Kitchen Coronary artery disease    a. 03/2009 PCI RCA (3.0x18 BMS).  . Dyslipidemia   . Exertional shortness of breath   . Hypertension   . Hypothyroidism   . PAF (paroxysmal atrial fibrillation) ([place])    a. CHA2DS2VASc = 7-->chronic coumadin;  b. s/p DCCV 05/2011, 05/2012, 07/2014;  c. On amio.  . Rash    a. felt to be 2/2 lasix/torsemide in setting of sulfa allergy (lasix d/c ~ 06/2014, torsemide d/c 08/09/2014).    Patient Active Problem List   Diagnosis Date Noted  . Nausea & vomiting 12/23/2017  . Pressure injury of skin 10/03/2016  . Allergy to sulfa drugs 10/02/2016  . Long term current use of  anticoagulant therapy 05/12/2015  . History of fracture of right hip 04/15/2015  . Senile purpura ([place]) 04/13/2015  . Angioimmunoblastic lymphoma ([place]) 04/05/2015  . PAF (paroxysmal atrial fibrillation) ([place])   . Arthritis 09/13/2014  . Intermittent LBBB 08/16/2014  . Drug-induced bradycardia 02/20/2014  . Prolonged Q-T interval on ECG 11/02/2012  . Hypotension 11/01/2012  . Leukocytosis 11/01/2012  . Aortic valvular stenosis, moderate to severe 06/24/2011  . Hyperlipidemia 06/21/2011  . Hypertensive heart disease with CHF (congestive heart failure) ([place]) 06/21/2011  . Hypothyroidism, goitrous 06/21/2011  . Coronary artery disease 06/21/2011    Past Surgical History:  Procedure Laterality Date  . ANGIOPLASTY    . APPENDECTOMY    . BACK SURGERY    . CARDIAC CATHETERIZATION  10/06/2009   Continue medical therapy  . CARDIAC CATHETERIZATION  04/07/2009   RCA stented with a 3x49mm stent resulting in a reduction of 90% narrowing to normal  . CARDIOVERSION  06/19/2011   Procedure: CARDIOVERSION;  Surgeon: Sanda Klein, MD;  Location: [facility];  Service: Cardiovascular;  Laterality: N/A;  . CARDIOVERSION  07/11/2010   Successful coversion to sinus rhythm  . CARDIOVERSION N/A 08/10/2014   Procedure: CARDIOVERSION;  Surgeon: Sanda Klein, MD;  Location: [facility];  Service: Cardiovascular;  Laterality: N/A;  . CATARACT EXTRACTION W/ INTRAOCULAR LENS  IMPLANT, BILATERAL    . CORONARY ANGIOPLASTY WITH  STENT PLACEMENT     "1" (10/30/2012)  . DILATION AND CURETTAGE OF UTERUS    . EXCISIONAL HEMORRHOIDECTOMY    . FRACTURE SURGERY    . HIP ARTHROPLASTY Right 04/17/2015   Procedure: HIP MONOPOLAR HIP HEMI ARTHROPLASTY;  Surgeon: Marybelle Killings, MD;  Location: [facility];  Service: Orthopedics;  Laterality: Right;  . LUMBAR DISC SURGERY  X 2   "cleaned out scar tissue"   . SHOULDER ACROMIOPLASTY Left    "fell; put a new ball in" (10/30/2012)  . WRIST FRACTURE SURGERY Left      OB History   None       Home Medications    Prior to Admission medications   Medication Sig Start Date End Date Taking? Authorizing Provider  acetaminophen (TYLENOL) 650 MG CR tablet Take 650 mg by mouth 3 (three) times daily. Morning, supper and bedtime    [provider]  atorvastatin (LIPITOR) 40 MG tablet Take 1 tablet (40 mg total) by mouth daily at 6 PM. 11/26/17   Croitoru, Mihai, MD  folic acid (FOLVITE) 1 MG tablet Take 2 tablets (2 mg total) by mouth daily. 02/04/17   Croitoru, Mihai, MD  ipratropium (ATROVENT) 0.03 % nasal spray Place 2 sprays into both nostrils 2 (two) times daily. Use as directed 02/10/17   [name], MD  levothyroxine (SYNTHROID, LEVOTHROID) 88 MCG tablet Take 1 tablet (88 mcg total) by mouth daily before breakfast. 10/24/17   [name], MD  magnesium hydroxide (MILK OF MAGNESIA) 400 MG/5ML suspension Take 15 mLs by mouth daily as needed for mild constipation. 10/08/16   Lavina Hamman, MD  metoprolol tartrate (LOPRESSOR) 25 MG tablet TAKE 1 TABLET TWICE A DAY 08/27/17   Croitoru, Mihai, MD  Multiple Vitamin (MULTIVITAMIN WITH MINERALS) TABS tablet Take 1 tablet by mouth daily. Centrum Silver    [provider]  spironolactone (ALDACTONE) 25 MG tablet Take 1 tablet (25 mg total) by mouth daily. 02/17/17   Croitoru, Mihai, MD  spironolactone (ALDACTONE) 25 MG tablet TAKE 1 TABLET DAILY 11/19/17   Croitoru, Mihai, MD  warfarin (COUMADIN) 2.5 MG tablet TAKE 1 TABLET DAILY OR AS DIRECTED BY COUMADIN CLINIC 11/06/17   Croitoru, Dani Gobble, MD    Family History Family History  Problem Relation Age of Onset  . Heart disease Brother   . Heart disease Brother   . Lung disease Brother   . Heart disease Mother 28  . Cancer Mother 16       Uterine  . Stroke Mother 58  . Heart failure Mother   . Hypertension Mother   . Heart disease Father   . Heart attack Father   . Hypertension Father   . Hypertension Child 34  . Alcohol abuse Child   . Cancer Son   .  Hypertension Sister   . Hypertension Brother     Social History Social History   Tobacco Use  . Smoking status: Never Smoker  . Smokeless tobacco: Never Used  Substance Use Topics  . Alcohol use: No  . Drug use: No     Allergies   [name] [ethacrynic acid]; Tape; Furosemide; Sulfa antibiotics; [name]; and Torsemide   Review of Systems Review of Systems Review of Systems   Constitutional  Negative for fever  Negative for chills  HENT  Negative for ear pain  Negative for sore throat  Negative for difficultly swallowing  Eyes  Negative for eye pain  Negative for visual disturbance  Respiratory  Negative for shortness of breath  Negative for cough  CV  Negative for chest pain  Negative for leg swelling  Abdomen  Negative for abdominal pain  +for nausea  +for vomiting  MSK  Negative for extremity pain  Negative for back pain  Skin  Negative for rash  Negative for wound  Neuro  Negative for syncope  Negative for difficultly speaking  Psych  Negative for confusion   The remainder of the ROS was reviewed and negative except as documented above.      Physical Exam Updated Vital Signs BP (!) 104/51   Pulse 86   Temp 98 F (36.7 C) (Oral)   Resp 17   Ht 5' 3.5" (1.613 m)   Wt 59.6 kg (131 lb 8 oz) Comment: scale c  SpO2 97%   BMI 22.93 kg/m   Physical Exam Physical Exam Constitutional  Nursing notes reviewed  Vital signs reviewed  HEENT  No obvious trauma  Supple without meningismus, mass, or overt JVD  EOMI  No scleral icterus or injection  Respiratory  Effort normal  CTAB  No respiratory distress  CV  Tachycardic, irregular rate  Systolic murmur  No pitting edema  Abdomen  Soft  Non-tender  Non-distended  No peritonitis  MSK  Atraumatic  No obvious deformity  ROM appropriate  Skin  Warm  Dry  Neuro  Awake and alert  EOMI  Moving all extremities  Psychiatric  Mood and affect  normal   DRE:  External non-thrombosed hemorrhoid, no fissure, no blood      ED Treatments / Results  Labs (all labs ordered are listed, but only abnormal results are displayed) Labs Reviewed  PROTIME-INR - Abnormal; Notable for the following components:      Result Value   Prothrombin Time 25.9 (*)    All other components within normal limits  CBC WITH DIFFERENTIAL/PLATELET - Abnormal; Notable for the following components:   WBC 19.7 (*)    Neutro Abs 17.9 (*)    Lymphs Abs 0.2 (*)    Monocytes Absolute 1.5 (*)    All other components within normal limits  BASIC METABOLIC PANEL - Abnormal; Notable for the following components:   Glucose, Bld 138 (*)    BUN 32 (*)    Creatinine, Ser 1.45 (*)    GFR calc non Af Amer 30 (*)    GFR calc Af Amer 35 (*)    All other components within normal limits  HEPATIC FUNCTION PANEL - Abnormal; Notable for the following components:   Total Protein 8.3 (*)    Indirect Bilirubin 1.0 (*)    All other components within normal limits  TROPONIN I - Abnormal; Notable for the following components:   Troponin I 0.03 (*)    All other components within normal limits  GASTROINTESTINAL PANEL BY PCR, STOOL (REPLACES STOOL CULTURE)  CULTURE, BLOOD (ROUTINE X 2)  CULTURE, BLOOD (ROUTINE X 2)  TROPONIN I  MAGNESIUM  COMPREHENSIVE METABOLIC PANEL  CBC  PROTIME-INR  STREP PNEUMONIAE URINARY ANTIGEN  LEGIONELLA PNEUMOPHILA SEROGP 1 UR AG  TROPONIN I  TROPONIN I  I-STAT TROPONIN, ED    EKG EKG Interpretation  Date/Time:  Tuesday December 23 2017 15:08:28 EDT Ventricular Rate:  102 PR Interval:    QRS Duration: 139 QT Interval:  391 QTC Calculation: 510 R Axis:   -70 Text Interpretation:  Atrial flutter Nonspecific IVCD with LAD LVH with secondary repolarization abnormality Anterior infarct, acute (LAD) Confirmed by Aletta Edouard [phone]) on  12/23/2017 3:44:22 PM   Radiology Ct Abdomen Pelvis Wo Contrast  Result Date: 12/23/2017 CLINICAL  DATA:  84 year old female with generalized weakness with nausea and vomiting that began today. Post appendectomy. Initial encounter. EXAM: CT ABDOMEN AND PELVIS WITHOUT CONTRAST TECHNIQUE: Multidetector CT imaging of the abdomen and pelvis was performed following the standard protocol without IV contrast. COMPARISON:  10/05/2016 CT. FINDINGS: Lower chest: Basilar patchy nodular densities greater on left may be related to aspiration or infection. Mitral valve calcification. Calcification within the left ventricle probably related to prior insult and without change. Hepatobiliary: Taking into account limitation by non contrast imaging, no worrisome hepatic lesion. 1.2 cm gallstone with prominent size gallbladder without CT evidence of inflammation. If this were of concern ultrasound may be considered. No common bile duct stone. Pancreas: Taking into account limitation by non contrast imaging, no pancreatic mass or inflammation. Spleen: Taking into account limitation by non contrast imaging, no splenic mass or enlargement. Adrenals/Urinary Tract: No obstructing stone or hydronephrosis. Taking into account limitation by non contrast imaging, no worrisome renal or adrenal lesion. Streak artifact limits urinary bladder. No obvious abnormality. Stomach/Bowel: Partially fluid-filled small bowel and colon without extraluminal bowel inflammatory process. Post appendectomy. Secretion filled stomach without abnormality noted. Vascular/Lymphatic: Atherosclerotic changes aorta and aortic branch vessels. No abdominal aortic aneurysm. Scattered normal size lymph nodes without adenopathy. Reproductive: No worrisome adnexal or uterine mass. Other: No free air or bowel containing hernia. Musculoskeletal: Post right hip replacement. Degenerative changes throughout the lower thoracic and lumbar spine. No compression fracture or destructive lesion. Remote fracture inferior right pubic ramus. IMPRESSION: 1. Partially fluid-filled small  bowel and colon without extraluminal bowel inflammatory process. This may represent changes of mild enteritis/diarrhea respectively. 2. 1.2 cm gallstone with prominent size gallbladder without CT evidence of inflammation. If this were of concern ultrasound may be considered. 3. Lung base patchy nodular densities greater on left may be related to aspiration or infection. 4.  Aortic Atherosclerosis (ICD10-I70.0). Electronically Signed   By: Genia Del M.D.   On: 12/23/2017 18:49   Dg Chest Port 1 View  Result Date: 12/23/2017 CLINICAL DATA:  Weakness.  Nausea.  Vomiting. EXAM: PORTABLE CHEST 1 VIEW COMPARISON:  06/06/2017. FINDINGS: Mediastinum and hilar structures are stable. Stable cardiomegaly. Stable central pulmonary artery prominence. Pulmonary hypertension may be present. No focal infiltrate. COPD. No pleural effusion or pneumothorax. Previously identified pleural effusion on 06/06/2017 is clear biapical pleural thickening noted consistent scarring. Left shoulder replacement. Degenerative changes thoracic spine. IMPRESSION: 1. Cardiomegaly with prominent central pulmonary artery suggesting pulmonary hypertension. Findings are stable from prior exam. 2. COPD. No acute pulmonary disease. No evidence of pleural effusion noted on today's exam. Electronically Signed   By: Marcello Moores  Register   On: 12/23/2017 15:57    Procedures Procedures (including critical care time)  Medications Ordered in ED Medications  azelastine (ASTELIN) 0.1 % nasal spray 1 spray (1 spray Each Nare Given 12/23/17 2226)  fluticasone (FLONASE) 50 MCG/ACT nasal spray 2 spray (0 sprays Each Nare Hold 12/23/17 2005)  atorvastatin (LIPITOR) tablet 40 mg (has no administration in time range)  folic acid (FOLVITE) tablet 2 mg (2 mg Oral Given 12/23/17 2225)  ipratropium (ATROVENT) 0.03 % nasal spray 2 spray (2 sprays Each Nare Not Given 12/23/17 2229)  levothyroxine (SYNTHROID, LEVOTHROID) tablet 88 mcg (has no administration in time  range)  magnesium hydroxide (MILK OF MAGNESIA) suspension 15 mL (has no administration in time range)  metoprolol tartrate (LOPRESSOR) tablet 25 mg (25 mg  Oral Given 12/23/17 2225)  multivitamin with minerals tablet 1 tablet (1 tablet Oral Given 12/23/17 2225)  spironolactone (ALDACTONE) tablet 25 mg (25 mg Oral Given 12/23/17 2225)  loratadine (CLARITIN) tablet 10 mg (10 mg Oral Given 12/23/17 2225)  prochlorperazine (COMPAZINE) injection 10 mg (has no administration in time range)  0.9 %  sodium chloride infusion ( Intravenous IV Pump [employer] 12/23/17 2050)  acetaminophen (TYLENOL) tablet 650 mg (has no administration in time range)    Or  acetaminophen (TYLENOL) suppository 650 mg (has no administration in time range)  piperacillin-tazobactam (ZOSYN) IVPB 3.375 g (has no administration in time range)  Warfarin - Pharmacist Dosing Inpatient (has no administration in time range)  lactated ringers bolus 500 mL (0 mLs Intravenous Stopped 12/23/17 1755)  ondansetron (ZOFRAN) injection 4 mg (4 mg Intravenous Given 12/23/17 1649)  famotidine (PEPCID) IVPB 20 mg premix (0 mg Intravenous Stopped 12/23/17 1752)  piperacillin-tazobactam (ZOSYN) IVPB 3.375 g (3.375 g Intravenous Transfusing/Transfer 12/23/17 2046)     Initial Impression / Assessment and Plan / ED Course  I have reviewed the triage vital signs and the nursing notes.  Pertinent labs & imaging results that were available during my care of the patient were reviewed by me and considered in my medical decision making (see chart for details).  Clinical Course as of Dec 24 44  Tue Dec 23, 8644  7934 84 year old female brought in by family after 4 episodes of vomiting.  She states she had cereal and a peach and then threw up and her sister is here helping out with history so the last time she threw up it was brownish-red.  She denies any abdominal pain but she says her stomach feels sour.  She is awake alert.  She is on anticoagulation for A.  fib.  Abdomen is soft nontender nondistended.  Getting labs EKG coags PPI fluids.   [MB]  8099 ECG has baseline A. fib and bundle pattern but what appears to be may be new ST elevations anteriorly.  I have put a call into cardiology to have them take a look at this.   [MB]  1549 Gust with the interventional attendings who did not feel this was significantly changed from prior and recommends no intervention at this time.   [MB]  8338 I reviewed her labs.  She has a significant leukocytosis to 19.7.  She is a mild AKI, with a creatinine of 1.45, which is above her baseline.  Her BUN is also elevated to 32.  She has no other significant abnormalities on CBC or BMP.  Her troponin is not elevated.  She continues to vomit.  Although she does not have abdominal pain, I am concerned for intraabdominal pathology.  CT abdomen/pelvis without contrast was ordered (due to her AKI). This revealed possible enteritis and a 1.2cm gallstone.  She has no RUQ tenderness.  I do not think she has cholecystitis or cholangitis.  [NA]    Clinical Course User Index [MB] Hayden Rasmussen, MD [NA] Alford Highland, MD  She was given IV fluids, Zofran, and a PPI in the ED.  She continued to vomit in the ED. Due to concern for enteritis and intractable nausea and vomiting along with her AKI, she was admitted to the hospital for further management care.  The care of this patient was supervised by Dr. Melina Copa, who agreed with the plan and management of the patient.   Final Clinical Impressions(s) / ED Diagnoses   Final diagnoses:  Weakness  ED Discharge Orders    None       Alford Highland, MD 12/24/17 2336    Hayden Rasmussen, MD 12/24/17 [phone]

## 2017-12-23 NOTE — Progress Notes (Signed)
ANTICOAGULATION CONSULT NOTE - Initial Consult  Pharmacy Consult for warfarin Indication: atrial fibrillation  Allergies  Allergen Reactions  . [name] [Ethacrynic Acid] Nausea And Vomiting  . Tape Other (See Comments)    Tears skin off.  Please use "paper" tape only.  . Furosemide Rash  . Sulfa Antibiotics Itching and Rash  . [name] Rash    Per Dr Sallyanne Kuster, had a skin reaction with thiazides prior to use of Lasix  . Torsemide Rash    Vital Signs: BP: 95/53 (07/30 2030) Pulse Rate: 95 (07/30 2030)  Labs: Recent Labs    12/23/17 1601  HGB 13.7  HCT 43.9  PLT 215  LABPROT 25.9*  INR 2.40  CREATININE 1.45*  TROPONINI <0.03    CrCl cannot be calculated (Unknown ideal weight.).   Medical History: Past Medical History:  Diagnosis Date  .  Acute respiratiory failure requiring BiPap 06/24/2011  . Allergy   . Aortic valvular stenosis, moderate to severe 06/24/2011   a. 10/2012 Echo: mod AS, Valve 1.25 cm^2 (VTI), 1.19cm^2 (Vmax).  . Arthritis    "back" (10/30/2012)  . Chronic diastolic CHF (congestive heart failure) ([place])    a. 10/2012 Echo: EF 55-60%, no rwma, Gr 2 DD, moderate AS, mod dil LA, mildly to mod dil RA.  Marland Kitchen Coronary artery disease    a. 03/2009 PCI RCA (3.0x18 BMS).  . Dyslipidemia   . Exertional shortness of breath   . Hypertension   . Hypothyroidism   . PAF (paroxysmal atrial fibrillation) ([place])    a. CHA2DS2VASc = 7-->chronic coumadin;  b. s/p DCCV 05/2011, 05/2012, 07/2014;  c. On amio.  . Rash    a. felt to be 2/2 lasix/torsemide in setting of sulfa allergy (lasix d/c ~ 06/2014, torsemide d/c 08/09/2014).   Assessment: 89yoF on warfarin for atrial fibrillation. Unable to verify last dose of warfarin PTA her family has already left for the evening. INR therapeutic (2.4) in the ED. Will hold tonight's warfarin dose given INR is in the middle of therapeutic range.  Per clinic notes her warfarin PTA dose: 1.25mg  Friday and 2.5mg  all other days.    Goal of Therapy:  INR 2-3 Monitor platelets by anticoagulation protocol: Yes   Plan:  HOLD warfarin tonight Daily INR/CBC Monitor for s/sx of bleeding  [name] [name] 12/23/2017,8:44 PM

## 2017-12-23 NOTE — H&P (Addendum)
TRH H&P   Patient Demographics:    [name], is a 84 y.o. female  MRN: [id number]   DOB - [birth year]  Admit Date - 12/23/2017  Outpatient Primary MD for the patient is [name], MD  Referring MD/NP/PA:    Alford Highland  Outpatient Specialists:    Patient coming from: home  No chief complaint on file.  cc: nausea and vomitting   HPI:    [name]  is a 84 y.o. female, w  Hypothyroid, CAD, CHF (EF 65%) , Pafib , mod- AS, apparently presents with n/v started this am after breakfast. "a peach"   Pt denies fever, chill, cp, palp, cough, sob, abd pain, diarrhea, brbpr, black stool.    In ED,  Pt appears fairly comfortable.   Wbc 19.7, Hgb 13.7, Plt 215  Ast 40, Alt 35 Na 141, K 4.4, Bun 32, Creatinine 1.45 Magnesium 2.3  INR 2.4 Trop <0.03  Pt will be admitted for n/v, enteritis, and mild ARF     Review of systems:    In addition to the HPI above, No Fever-chills, No Headache, No changes with Vision or hearing, No problems swallowing food or Liquids, No Chest pain, Cough or Shortness of Breath,  No Blood in stool or Urine, No dysuria, No new skin rashes or bruises, No new joints pains-aches,  No new weakness, tingling, numbness in any extremity, No recent weight gain or loss, No polyuria, polydypsia or polyphagia, No significant Mental Stressors.  A full 10 point Review of Systems was done, except as stated above, all other Review of Systems were negative.   With Past History of the following :    Past Medical History:  Diagnosis Date  .  Acute respiratiory failure requiring BiPap 06/24/2011  . Allergy   . Aortic valvular stenosis, moderate to severe 06/24/2011   a. 10/2012 Echo: mod AS, Valve 1.25 cm^2 (VTI), 1.19cm^2 (Vmax).  . Arthritis    "back" (10/30/2012)  . Chronic diastolic CHF (congestive heart failure) ([place])    a. 10/2012 Echo:  EF 55-60%, no rwma, Gr 2 DD, moderate AS, mod dil LA, mildly to mod dil RA.  Marland Kitchen Coronary artery disease    a. 03/2009 PCI RCA (3.0x18 BMS).  . Dyslipidemia   . Exertional shortness of breath   . Hypertension   . Hypothyroidism   . PAF (paroxysmal atrial fibrillation) ([place])    a. CHA2DS2VASc = 7-->chronic coumadin;  b. s/p DCCV 05/2011, 05/2012, 07/2014;  c. On amio.  . Rash    a. felt to be 2/2 lasix/torsemide in setting of sulfa allergy (lasix d/c ~ 06/2014, torsemide d/c 08/09/2014).      Past Surgical History:  Procedure Laterality Date  . ANGIOPLASTY    . APPENDECTOMY    . BACK SURGERY    . CARDIAC CATHETERIZATION  10/06/2009   Continue medical therapy  . CARDIAC  CATHETERIZATION  04/07/2009   RCA stented with a 3x42mm stent resulting in a reduction of 90% narrowing to normal  . CARDIOVERSION  06/19/2011   Procedure: CARDIOVERSION;  Surgeon: Sanda Klein, MD;  Location: [facility];  Service: Cardiovascular;  Laterality: N/A;  . CARDIOVERSION  07/11/2010   Successful coversion to sinus rhythm  . CARDIOVERSION N/A 08/10/2014   Procedure: CARDIOVERSION;  Surgeon: Sanda Klein, MD;  Location: [facility];  Service: Cardiovascular;  Laterality: N/A;  . CATARACT EXTRACTION W/ INTRAOCULAR LENS  IMPLANT, BILATERAL    . CORONARY ANGIOPLASTY WITH STENT PLACEMENT     "1" (10/30/2012)  . DILATION AND CURETTAGE OF UTERUS    . EXCISIONAL HEMORRHOIDECTOMY    . FRACTURE SURGERY    . HIP ARTHROPLASTY Right 04/17/2015   Procedure: HIP MONOPOLAR HIP HEMI ARTHROPLASTY;  Surgeon: Marybelle Killings, MD;  Location: [facility];  Service: Orthopedics;  Laterality: Right;  . LUMBAR DISC SURGERY  X 2   "cleaned out scar tissue"   . SHOULDER ACROMIOPLASTY Left    "fell; put a new ball in" (10/30/2012)  . WRIST FRACTURE SURGERY Left       Social History:     Social History   Tobacco Use  . Smoking status: Never Smoker  . Smokeless tobacco: Never Used  Substance Use Topics  . Alcohol use: No     Lives - at  home  Mobility - walks by self   Family History :     Family History  Problem Relation Age of Onset  . Heart disease Brother   . Heart disease Brother   . Lung disease Brother   . Heart disease Mother 68  . Cancer Mother 65       Uterine  . Stroke Mother 6  . Heart failure Mother   . Hypertension Mother   . Heart disease Father   . Heart attack Father   . Hypertension Father   . Hypertension Child 34  . Alcohol abuse Child   . Cancer Son   . Hypertension Sister   . Hypertension Brother        Home Medications:   Prior to Admission medications   Medication Sig Start Date End Date Taking? Authorizing Provider  acetaminophen (TYLENOL) 650 MG CR tablet Take 650 mg by mouth 3 (three) times daily. Morning, supper and bedtime    [provider]  atorvastatin (LIPITOR) 40 MG tablet Take 1 tablet (40 mg total) by mouth daily at 6 PM. 11/26/17   Croitoru, Mihai, MD  folic acid (FOLVITE) 1 MG tablet Take 2 tablets (2 mg total) by mouth daily. 02/04/17   Croitoru, Mihai, MD  ipratropium (ATROVENT) 0.03 % nasal spray Place 2 sprays into both nostrils 2 (two) times daily. Use as directed 02/10/17   [name], MD  levothyroxine (SYNTHROID, LEVOTHROID) 88 MCG tablet Take 1 tablet (88 mcg total) by mouth daily before breakfast. 10/24/17   [name], MD  magnesium hydroxide (MILK OF MAGNESIA) 400 MG/5ML suspension Take 15 mLs by mouth daily as needed for mild constipation. 10/08/16   Lavina Hamman, MD  metoprolol tartrate (LOPRESSOR) 25 MG tablet TAKE 1 TABLET TWICE A DAY 08/27/17   Croitoru, Mihai, MD  Multiple Vitamin (MULTIVITAMIN WITH MINERALS) TABS tablet Take 1 tablet by mouth daily. Centrum Silver    [provider]  spironolactone (ALDACTONE) 25 MG tablet Take 1 tablet (25 mg total) by mouth daily. 02/17/17   Croitoru, Dani Gobble, MD  spironolactone (ALDACTONE) 25  MG tablet TAKE 1 TABLET DAILY 11/19/17   Croitoru, Mihai, MD  warfarin (COUMADIN) 2.5 MG tablet TAKE 1  TABLET DAILY OR AS DIRECTED BY COUMADIN CLINIC 11/06/17   Croitoru, Dani Gobble, MD     Allergies:     Allergies  Allergen Reactions  . [name] [Ethacrynic Acid] Nausea And Vomiting  . Tape Other (See Comments)    Tears skin off.  Please use "paper" tape only.  . Furosemide Rash  . Sulfa Antibiotics Itching and Rash  . [name] Rash    Per Dr Sallyanne Kuster, had a skin reaction with thiazides prior to use of Lasix  . Torsemide Rash     Physical Exam:   Vitals  Blood pressure (!) 101/48, pulse 97, resp. rate (!) 33, SpO2 95 %.   1. General lying in bed in NAD,   2. Normal affect and insight, Not Suicidal or Homicidal, Awake Alert, Oriented X 3.  3. No F.N deficits, ALL C.Nerves Intact, Strength 5/5 all 4 extremities, Sensation intact all 4 extremities, Plantars down going.  4. Ears and Eyes appear Normal, Conjunctivae clear, PERRLA. Mucous membranes dry  5. Supple Neck, No JVD, No cervical lymphadenopathy appriciated, No Carotid Bruits.  6. Symmetrical Chest wall movement, Good air movement bilaterally, CTAB.  7. Irr, irr s1, s2, 2/6 sem rusb  8. Positive Bowel Sounds, Abdomen Soft, No tenderness, No organomegaly appriciated,No rebound -guarding or rigidity.  9.  No Cyanosis, Normal Skin Turgor, No Skin Rash or Bruise.  10. Good muscle tone,  joints appear normal , no effusions, Normal ROM.  11. No Palpable Lymph Nodes in Neck or Axillae      Data Review:    CBC Recent Labs  Lab 12/23/17 1601  WBC 19.7*  HGB 13.7  HCT 43.9  PLT 215  MCV 94.4  MCH 29.5  MCHC 31.2  RDW 13.2  LYMPHSABS 0.2*  MONOABS 1.5*  EOSABS 0.0  BASOSABS 0.1   ------------------------------------------------------------------------------------------------------------------  Chemistries  Recent Labs  Lab 12/23/17 1601  NA 141  K 4.4  CL 103  CO2 23  GLUCOSE 138*  BUN 32*  CREATININE 1.45*  CALCIUM 9.6  MG 2.3  AST 40  ALT 35  ALKPHOS 85  BILITOT 1.2    ------------------------------------------------------------------------------------------------------------------ CrCl cannot be calculated (Unknown ideal weight.). ------------------------------------------------------------------------------------------------------------------ No results for input(s): TSH, T4TOTAL, T3FREE, THYROIDAB in the last 72 hours.  Invalid input(s): FREET3  Coagulation profile Recent Labs  Lab 12/23/17 1601  INR 2.40   ------------------------------------------------------------------------------------------------------------------- No results for input(s): DDIMER in the last 72 hours. -------------------------------------------------------------------------------------------------------------------  Cardiac Enzymes Recent Labs  Lab 12/23/17 1601  TROPONINI <0.03   ------------------------------------------------------------------------------------------------------------------    Component Value Date/Time   BNP 317.8 (H) 10/02/2016 1735     ---------------------------------------------------------------------------------------------------------------  Urinalysis    Component Value Date/Time   COLORURINE YELLOW 10/05/2016 2018   APPEARANCEUR CLEAR 10/05/2016 2018   LABSPEC 1.013 10/05/2016 2018   PHURINE 5.0 10/05/2016 2018   GLUCOSEU NEGATIVE 10/05/2016 2018   HGBUR MODERATE (A) 10/05/2016 2018   BILIRUBINUR NEGATIVE 10/05/2016 2018   KETONESUR NEGATIVE 10/05/2016 2018   PROTEINUR NEGATIVE 10/05/2016 2018   UROBILINOGEN 1.0 11/01/2012 1849   NITRITE NEGATIVE 10/05/2016 2018   LEUKOCYTESUR NEGATIVE 10/05/2016 2018    ----------------------------------------------------------------------------------------------------------------   Imaging Results:    Ct Abdomen Pelvis Wo Contrast  Result Date: 12/23/2017 CLINICAL DATA:  84 year old female with generalized weakness with nausea and vomiting that began today. Post appendectomy. Initial  encounter. EXAM: CT ABDOMEN AND PELVIS WITHOUT CONTRAST TECHNIQUE:  Multidetector CT imaging of the abdomen and pelvis was performed following the standard protocol without IV contrast. COMPARISON:  10/05/2016 CT. FINDINGS: Lower chest: Basilar patchy nodular densities greater on left may be related to aspiration or infection. Mitral valve calcification. Calcification within the left ventricle probably related to prior insult and without change. Hepatobiliary: Taking into account limitation by non contrast imaging, no worrisome hepatic lesion. 1.2 cm gallstone with prominent size gallbladder without CT evidence of inflammation. If this were of concern ultrasound may be considered. No common bile duct stone. Pancreas: Taking into account limitation by non contrast imaging, no pancreatic mass or inflammation. Spleen: Taking into account limitation by non contrast imaging, no splenic mass or enlargement. Adrenals/Urinary Tract: No obstructing stone or hydronephrosis. Taking into account limitation by non contrast imaging, no worrisome renal or adrenal lesion. Streak artifact limits urinary bladder. No obvious abnormality. Stomach/Bowel: Partially fluid-filled small bowel and colon without extraluminal bowel inflammatory process. Post appendectomy. Secretion filled stomach without abnormality noted. Vascular/Lymphatic: Atherosclerotic changes aorta and aortic branch vessels. No abdominal aortic aneurysm. Scattered normal size lymph nodes without adenopathy. Reproductive: No worrisome adnexal or uterine mass. Other: No free air or bowel containing hernia. Musculoskeletal: Post right hip replacement. Degenerative changes throughout the lower thoracic and lumbar spine. No compression fracture or destructive lesion. Remote fracture inferior right pubic ramus. IMPRESSION: 1. Partially fluid-filled small bowel and colon without extraluminal bowel inflammatory process. This may represent changes of mild enteritis/diarrhea  respectively. 2. 1.2 cm gallstone with prominent size gallbladder without CT evidence of inflammation. If this were of concern ultrasound may be considered. 3. Lung base patchy nodular densities greater on left may be related to aspiration or infection. 4.  Aortic Atherosclerosis (ICD10-I70.0). Electronically Signed   By: Genia Del M.D.   On: 12/23/2017 18:49   Dg Chest Port 1 View  Result Date: 12/23/2017 CLINICAL DATA:  Weakness.  Nausea.  Vomiting. EXAM: PORTABLE CHEST 1 VIEW COMPARISON:  06/06/2017. FINDINGS: Mediastinum and hilar structures are stable. Stable cardiomegaly. Stable central pulmonary artery prominence. Pulmonary hypertension may be present. No focal infiltrate. COPD. No pleural effusion or pneumothorax. Previously identified pleural effusion on 06/06/2017 is clear biapical pleural thickening noted consistent scarring. Left shoulder replacement. Degenerative changes thoracic spine. IMPRESSION: 1. Cardiomegaly with prominent central pulmonary artery suggesting pulmonary hypertension. Findings are stable from prior exam. 2. COPD. No acute pulmonary disease. No evidence of pleural effusion noted on today's exam. Electronically Signed   By: Marcello Moores  Register   On: 12/23/2017 15:57   Afib at 100, LAD, Q in v1-3, LBBB, slight st depression v6, and slight worse t inversion in avl   Assessment & Plan:    Principal Problem:   Nausea & vomiting Active Problems:   Hypothyroidism, goitrous   Leukocytosis   PAF (paroxysmal atrial fibrillation) (HCC)    Nausea and vomitting ddx viral enteritis, cap, pafib w underlying ischemia Trop I q6h x3 Compazine 10mg  iv q6h prn  Check GI pathogen panel  Leukocytosis secondary to n/v vs pneumonia Check cbc in am  ? Aspiration pneumonia vs Cap Blood culture x2 Urine strep antigen Urine legionella antigen Zosyn iv pharmacy to dose  Enteritis Zosyn iv  Mild ARF/ Dehydration Ns 38mL / hr x 8 hours Check cmp in am  Pafib , Chronic  Diastolic CHF (EF 16=-07%), CAD Cont metoprolol 25mg  po bid Cont Spironolactone 25mg  po qday Cont Lipitor 40mg  po qhs Coumadin pharmacy to dose  Moderate AS Check cardiac echo  Hypothyroidism Cont levothyroxine 88  micrograms po qday  Hayfever Cont atrovent ns Start Astelin ns   DVT Prophylaxis  Coumadin pharmacy to dose  AM Labs Ordered, also please review Full Orders  Family Communication: Admission, patients condition and plan of care including tests being ordered have been discussed with the patient and daughter who indicate understanding and agree with the plan and Code Status.  Code Status  FULL CODE  Likely DC to  home  Condition GUARDED   Consults called:   none  Admission status: observation  Time spent in minutes : 3   Jani Gravel M.D on 12/23/2017 at 7:48 PM  Between 7am to 7pm - Pager - [phone]  . After 7pm go to [email or web address] - password [facility]  [facility] - Office  [phone]

## 2017-12-23 NOTE — Progress Notes (Signed)
CRITICAL VALUE ALERT  Critical Value:  Troponin 0.03  Date & Time Notied:  12/23/17 2356  Provider Notified: Baltazar Najjar  Orders Received/Actions taken: no new orders given

## 2017-12-23 NOTE — ED Triage Notes (Signed)
Per EMS: pt from home with husband with c/o general weakness and nausea.  Pt ste twice today and vomited after each meal.  Pt felt fine yesterday, drove husband to his dr appointment.  Pt denies any pain, dizziness, or shob.  Pt A/O x4

## 2017-12-24 ENCOUNTER — Observation Stay (HOSPITAL_COMMUNITY): Payer: Medicare Other

## 2017-12-24 ENCOUNTER — Other Ambulatory Visit: Payer: Self-pay

## 2017-12-24 DIAGNOSIS — I480 Paroxysmal atrial fibrillation: Secondary | ICD-10-CM | POA: Diagnosis not present

## 2017-12-24 DIAGNOSIS — D72829 Elevated white blood cell count, unspecified: Secondary | ICD-10-CM | POA: Diagnosis not present

## 2017-12-24 DIAGNOSIS — R112 Nausea with vomiting, unspecified: Secondary | ICD-10-CM | POA: Diagnosis not present

## 2017-12-24 DIAGNOSIS — E071 Dyshormogenetic goiter: Secondary | ICD-10-CM | POA: Diagnosis not present

## 2017-12-24 LAB — CBC
HCT: 33.1 % — ABNORMAL LOW (ref 36.0–46.0)
Hemoglobin: 10.8 g/dL — ABNORMAL LOW (ref 12.0–15.0)
MCH: 30.3 pg (ref 26.0–34.0)
MCHC: 32.6 g/dL (ref 30.0–36.0)
MCV: 92.7 fL (ref 78.0–100.0)
PLATELETS: 194 10*3/uL (ref 150–400)
RBC: 3.57 MIL/uL — AB (ref 3.87–5.11)
RDW: 13.7 % (ref 11.5–15.5)
WBC: 12.8 10*3/uL — AB (ref 4.0–10.5)

## 2017-12-24 LAB — COMPREHENSIVE METABOLIC PANEL
ALT: 25 U/L (ref 0–44)
AST: 26 U/L (ref 15–41)
Albumin: 3.2 g/dL — ABNORMAL LOW (ref 3.5–5.0)
Alkaline Phosphatase: 57 U/L (ref 38–126)
Anion gap: 8 (ref 5–15)
BILIRUBIN TOTAL: 1.2 mg/dL (ref 0.3–1.2)
BUN: 34 mg/dL — AB (ref 8–23)
CHLORIDE: 109 mmol/L (ref 98–111)
CO2: 25 mmol/L (ref 22–32)
CREATININE: 1.28 mg/dL — AB (ref 0.44–1.00)
Calcium: 8.5 mg/dL — ABNORMAL LOW (ref 8.9–10.3)
GFR, EST AFRICAN AMERICAN: 41 mL/min — AB (ref >60)
GFR, EST NON AFRICAN AMERICAN: 35 mL/min — AB (ref >60)
Glucose, Bld: 97 mg/dL (ref 70–99)
POTASSIUM: 4.5 mmol/L (ref 3.5–5.1)
Sodium: 142 mmol/L (ref 135–145)
TOTAL PROTEIN: 6.3 g/dL — AB (ref 6.5–8.1)

## 2017-12-24 LAB — TROPONIN I
TROPONIN I: 0.03 ng/mL — AB (ref <0.03)
Troponin I: 0.03 ng/mL (ref <0.03)

## 2017-12-24 LAB — PROTIME-INR
INR: 2.91
Prothrombin Time: 30.2 seconds — ABNORMAL HIGH (ref 11.4–15.2)

## 2017-12-24 MED ORDER — WARFARIN SODIUM 2.5 MG PO TABS
2.5000 mg | ORAL_TABLET | Freq: Once | ORAL | Status: AC
  Administered 2017-12-24: 2.5 mg via ORAL
  Filled 2017-12-24: qty 1

## 2017-12-24 MED ORDER — SODIUM CHLORIDE 0.9 % IV SOLN: INTRAVENOUS | Status: DC

## 2017-12-24 NOTE — Progress Notes (Signed)
ANTICOAGULATION CONSULT NOTE - Initial Consult  Pharmacy Consult for warfarin Indication: atrial fibrillation  Allergies  Allergen Reactions  . [name] [Ethacrynic Acid] Nausea And Vomiting  . Tape Other (See Comments)    Tears skin off.  Please use "paper" tape only.  . Furosemide Rash  . Sulfa Antibiotics Itching and Rash  . [name] Rash    Per Dr Sallyanne Kuster, had a skin reaction with thiazides prior to use of Lasix  . Torsemide Rash    Vital Signs: Temp: 98.3 F (36.8 C) (07/31 0557) Temp Source: Oral (07/31 0557) BP: 98/46 (07/31 0557) Pulse Rate: 74 (07/31 0557)  Labs: Recent Labs    12/23/17 1601 12/23/17 2224 12/24/17 0606  HGB 13.7  --  10.8*  HCT 43.9  --  33.1*  PLT 215  --  194  LABPROT 25.9*  --  30.2*  INR 2.40  --  2.91  CREATININE 1.45*  --  1.28*  TROPONINI <0.03 0.03* 0.03*    Estimated Creatinine Clearance: 23.7 mL/min (A) (by C-G formula based on SCr of 1.28 mg/dL (H)).   Medical History: Past Medical History:  Diagnosis Date  .  Acute respiratiory failure requiring BiPap 06/24/2011  . Allergy   . Aortic valvular stenosis, moderate to severe 06/24/2011   a. 10/2012 Echo: mod AS, Valve 1.25 cm^2 (VTI), 1.19cm^2 (Vmax).  . Arthritis    "back" (10/30/2012)  . Chronic diastolic CHF (congestive heart failure) ([place])    a. 10/2012 Echo: EF 55-60%, no rwma, Gr 2 DD, moderate AS, mod dil LA, mildly to mod dil RA.  Marland Kitchen Coronary artery disease    a. 03/2009 PCI RCA (3.0x18 BMS).  . Dyslipidemia   . Exertional shortness of breath   . Hypertension   . Hypothyroidism   . PAF (paroxysmal atrial fibrillation) ([place])    a. CHA2DS2VASc = 7-->chronic coumadin;  b. s/p DCCV 05/2011, 05/2012, 07/2014;  c. On amio.  . Rash    a. felt to be 2/2 lasix/torsemide in setting of sulfa allergy (lasix d/c ~ 06/2014, torsemide d/c 08/09/2014).   Assessment: 89yoF on warfarin for atrial fibrillation. Unable to verify last dose of warfarin PTA her family has already  left for the evening. INR therapeutic (2.4) in the ED. INR is in the therapeutic range at 2.91. Will continue home dosing.  Per clinic notes her warfarin PTA dose: 1.25mg  Friday and 2.5mg  all other days.   Goal of Therapy:  INR 2-3 Monitor platelets by anticoagulation protocol: Yes   Plan:  Warfarin 2.5 mg x 1 tonight Daily INR/CBC Monitor for s/sx of bleeding  Corinda Gubler, PharmD 12/24/2017,8:44 AM

## 2017-12-24 NOTE — Progress Notes (Signed)
  Echocardiogram 2D Echocardiogram has been attempted. Patient HR 138 BPM. Will attempt again  another time.  Ethleen Lormand G Joie Reamer 12/24/2017, 11:50 AM

## 2017-12-24 NOTE — Progress Notes (Signed)
Progress Note    [name]  ZOX:[id number] DOB: November 20, 1923  DOA: 12/23/2017 PCP: [name], MD    Brief Narrative:     Medical records reviewed and are as summarized below:  [name] is an 84 y.o. female w  Hypothyroid, CAD, CHF (EF 65%) , Pafib , mod- AS, apparently presents with n/v started this am after breakfast, "a peach" .  Husband also ate the same peach but did not get sick.  Patient has been in the ER 3 times last week with her husband for a bleeding ear from skin cancer removal.   Assessment/Plan:   Principal Problem:   Nausea & vomiting Active Problems:   Hypothyroidism, goitrous   Coronary artery disease   Aortic valvular stenosis, moderate to severe   Leukocytosis   PAF (paroxysmal atrial fibrillation) (HCC)  Nausea and vomitting ddx viral enteritis, cap, pafib w underlying ischemia Seems improved, advance diet as tolerated  Leukocytosis  -trending down -follow  ? Aspiration pneumonia Blood culture x2 Urine strep antigen Urine legionella antigen Was given zosyn upon admission  Enteritis IV zosyn GI pathogen panel pending  AKI/ Dehydration Ns 37mL / hr   Pafib , Chronic Diastolic CHF (EF 81=-19%), CAD Cont metoprolol 25mg  po bid- with holding parameted Hold Spironolactone 25mg  po qday Cont Lipitor 40mg  po qhs Coumadin pharmacy to dose -gentle IVF for hypotension and AKI  Moderate AS Check cardiac echo  Hypothyroidism Cont levothyroxine 88 micrograms po qday-being adjusted by PCP (5/19 was 0.3)    Family Communication/Anticipated D/C date and plan/Code Status   DVT prophylaxis: coumadin Code Status: Full Code.  Family Communication: none Disposition Plan: pending improvement   Medical Consultants:    None.     Subjective:   Feeling better than when she was admitted  Objective:    Vitals:   12/23/17 2229 12/24/17 0500 12/24/17 0557 12/24/17 1217  BP: (!) 104/51  (!) 98/46 (!) 91/54  Pulse:  86  74 97  Resp:   16 (!) 22  Temp:   98.3 F (36.8 C) 98.8 F (37.1 C)  TempSrc:   Oral Oral  SpO2:   95% 94%  Weight:  59.6 kg (131 lb 8 oz)    Height:        Intake/Output Summary (Last 24 hours) at 12/24/2017 1254 Last data filed at 12/24/2017 1100 Gross per 24 hour  Intake 1251.41 ml  Output 500 ml  Net 751.41 ml   Filed Weights   12/23/17 2058 12/24/17 0500  Weight: 59.6 kg (131 lb 8 oz) 59.6 kg (131 lb 8 oz)    Exam: In bed, pleasant and cooperative irr but not fast No wheezing, no increased work of breathing No rashes or lesions +BS, soft-- no RUQ pain with palpation  Data Reviewed:   I have personally reviewed following labs and imaging studies:  Labs: Labs show the following:   Basic Metabolic Panel: Recent Labs  Lab 12/23/17 1601 12/24/17 0606  NA 141 142  K 4.4 4.5  CL 103 109  CO2 23 25  GLUCOSE 138* 97  BUN 32* 34*  CREATININE 1.45* 1.28*  CALCIUM 9.6 8.5*  MG 2.3  --    GFR Estimated Creatinine Clearance: 23.7 mL/min (A) (by C-G formula based on SCr of 1.28 mg/dL (H)). Liver Function Tests: Recent Labs  Lab 12/23/17 1601 12/24/17 0606  AST 40 26  ALT 35 25  ALKPHOS 85 57  BILITOT 1.2 1.2  PROT 8.3* 6.3*  ALBUMIN 4.4 3.2*   No results for input(s): LIPASE, AMYLASE in the last 168 hours. No results for input(s): AMMONIA in the last 168 hours. Coagulation profile Recent Labs  Lab 12/23/17 1601 12/24/17 0606  INR 2.40 2.91    CBC: Recent Labs  Lab 12/23/17 1601 12/24/17 0606  WBC 19.7* 12.8*  NEUTROABS 17.9*  --   HGB 13.7 10.8*  HCT 43.9 33.1*  MCV 94.4 92.7  PLT 215 194   Cardiac Enzymes: Recent Labs  Lab 12/23/17 1601 12/23/17 2224 12/24/17 0606 12/24/17 0910  TROPONINI <0.03 0.03* 0.03* 0.03*   BNP (last 3 results) No results for input(s): PROBNP in the last 8760 hours. CBG: No results for input(s): GLUCAP in the last 168 hours. D-Dimer: No results for input(s): DDIMER in the last 72 hours. Hgb  A1c: No results for input(s): HGBA1C in the last 72 hours. Lipid Profile: No results for input(s): CHOL, HDL, LDLCALC, TRIG, CHOLHDL, LDLDIRECT in the last 72 hours. Thyroid function studies: No results for input(s): TSH, T4TOTAL, T3FREE, THYROIDAB in the last 72 hours.  Invalid input(s): FREET3 Anemia work up: No results for input(s): VITAMINB12, FOLATE, FERRITIN, TIBC, IRON, RETICCTPCT in the last 72 hours. Sepsis Labs: Recent Labs  Lab 12/23/17 1601 12/24/17 0606  WBC 19.7* 12.8*    Microbiology No results found for this or any previous visit (from the past 240 hour(s)).  Procedures and diagnostic studies:  Ct Abdomen Pelvis Wo Contrast  Result Date: 12/23/2017 CLINICAL DATA:  84 year old female with generalized weakness with nausea and vomiting that began today. Post appendectomy. Initial encounter. EXAM: CT ABDOMEN AND PELVIS WITHOUT CONTRAST TECHNIQUE: Multidetector CT imaging of the abdomen and pelvis was performed following the standard protocol without IV contrast. COMPARISON:  10/05/2016 CT. FINDINGS: Lower chest: Basilar patchy nodular densities greater on left may be related to aspiration or infection. Mitral valve calcification. Calcification within the left ventricle probably related to prior insult and without change. Hepatobiliary: Taking into account limitation by non contrast imaging, no worrisome hepatic lesion. 1.2 cm gallstone with prominent size gallbladder without CT evidence of inflammation. If this were of concern ultrasound may be considered. No common bile duct stone. Pancreas: Taking into account limitation by non contrast imaging, no pancreatic mass or inflammation. Spleen: Taking into account limitation by non contrast imaging, no splenic mass or enlargement. Adrenals/Urinary Tract: No obstructing stone or hydronephrosis. Taking into account limitation by non contrast imaging, no worrisome renal or adrenal lesion. Streak artifact limits urinary bladder. No  obvious abnormality. Stomach/Bowel: Partially fluid-filled small bowel and colon without extraluminal bowel inflammatory process. Post appendectomy. Secretion filled stomach without abnormality noted. Vascular/Lymphatic: Atherosclerotic changes aorta and aortic branch vessels. No abdominal aortic aneurysm. Scattered normal size lymph nodes without adenopathy. Reproductive: No worrisome adnexal or uterine mass. Other: No free air or bowel containing hernia. Musculoskeletal: Post right hip replacement. Degenerative changes throughout the lower thoracic and lumbar spine. No compression fracture or destructive lesion. Remote fracture inferior right pubic ramus. IMPRESSION: 1. Partially fluid-filled small bowel and colon without extraluminal bowel inflammatory process. This may represent changes of mild enteritis/diarrhea respectively. 2. 1.2 cm gallstone with prominent size gallbladder without CT evidence of inflammation. If this were of concern ultrasound may be considered. 3. Lung base patchy nodular densities greater on left may be related to aspiration or infection. 4.  Aortic Atherosclerosis (ICD10-I70.0). Electronically Signed   By: Genia Del M.D.   On: 12/23/2017 18:49   Dg Chest Port 1 View  Result Date: 12/23/2017  CLINICAL DATA:  Weakness.  Nausea.  Vomiting. EXAM: PORTABLE CHEST 1 VIEW COMPARISON:  06/06/2017. FINDINGS: Mediastinum and hilar structures are stable. Stable cardiomegaly. Stable central pulmonary artery prominence. Pulmonary hypertension may be present. No focal infiltrate. COPD. No pleural effusion or pneumothorax. Previously identified pleural effusion on 06/06/2017 is clear biapical pleural thickening noted consistent scarring. Left shoulder replacement. Degenerative changes thoracic spine. IMPRESSION: 1. Cardiomegaly with prominent central pulmonary artery suggesting pulmonary hypertension. Findings are stable from prior exam. 2. COPD. No acute pulmonary disease. No evidence of pleural  effusion noted on today's exam. Electronically Signed   By: Marcello Moores  Register   On: 12/23/2017 15:57    Medications:   . atorvastatin  40 mg Oral q1800  . azelastine  1 spray Each Nare BID  . fluticasone  2 spray Each Nare Daily  . folic acid  2 mg Oral Daily  . ipratropium  2 spray Each Nare BID  . levothyroxine  88 mcg Oral QAC breakfast  . loratadine  10 mg Oral Daily  . metoprolol tartrate  25 mg Oral BID  . multivitamin with minerals  1 tablet Oral Daily  . spironolactone  25 mg Oral Daily  . warfarin  2.5 mg Oral ONCE-1800  . Warfarin - Pharmacist Dosing Inpatient   Does not apply q1800   Continuous Infusions: . sodium chloride    . piperacillin-tazobactam (ZOSYN)  IV 3.375 g (12/24/17 0403)     LOS: 0 days   Geradine Girt  [facility]   *Please refer to [email or web address], password [email or web address] to get updated schedule on who will round on this patient, as hospitalists switch teams weekly. If 7PM-7AM, please contact night-coverage at [email or web address], password [email or web address] for any overnight needs.  12/24/2017, 12:54 PM

## 2017-12-25 ENCOUNTER — Other Ambulatory Visit (HOSPITAL_COMMUNITY): Payer: Medicare Other

## 2017-12-25 DIAGNOSIS — K529 Noninfective gastroenteritis and colitis, unspecified: Secondary | ICD-10-CM | POA: Diagnosis present

## 2017-12-25 DIAGNOSIS — J690 Pneumonitis due to inhalation of food and vomit: Secondary | ICD-10-CM

## 2017-12-25 DIAGNOSIS — N179 Acute kidney failure, unspecified: Secondary | ICD-10-CM | POA: Diagnosis not present

## 2017-12-25 LAB — GLUCOSE, CAPILLARY: GLUCOSE-CAPILLARY: 89 mg/dL (ref 70–99)

## 2017-12-25 LAB — BASIC METABOLIC PANEL
Anion gap: 7 (ref 5–15)
BUN: 27 mg/dL — AB (ref 8–23)
CALCIUM: 8.4 mg/dL — AB (ref 8.9–10.3)
CHLORIDE: 109 mmol/L (ref 98–111)
CO2: 24 mmol/L (ref 22–32)
CREATININE: 1.07 mg/dL — AB (ref 0.44–1.00)
GFR calc non Af Amer: 44 mL/min — ABNORMAL LOW (ref >60)
GFR, EST AFRICAN AMERICAN: 51 mL/min — AB (ref >60)
Glucose, Bld: 97 mg/dL (ref 70–99)
Potassium: 4.1 mmol/L (ref 3.5–5.1)
SODIUM: 140 mmol/L (ref 135–145)

## 2017-12-25 LAB — CBC WITH DIFFERENTIAL/PLATELET
Abs Immature Granulocytes: 0 10*3/uL (ref 0.0–0.1)
BASOS ABS: 0 10*3/uL (ref 0.0–0.1)
Basophils Relative: 0 %
EOS ABS: 0.1 10*3/uL (ref 0.0–0.7)
EOS PCT: 1 %
HCT: 33.1 % — ABNORMAL LOW (ref 36.0–46.0)
HEMOGLOBIN: 10.2 g/dL — AB (ref 12.0–15.0)
Immature Granulocytes: 0 %
Lymphocytes Relative: 10 %
Lymphs Abs: 1.2 10*3/uL (ref 0.7–4.0)
MCH: 29.7 pg (ref 26.0–34.0)
MCHC: 30.8 g/dL (ref 30.0–36.0)
MCV: 96.5 fL (ref 78.0–100.0)
MONO ABS: 1.5 10*3/uL — AB (ref 0.1–1.0)
Monocytes Relative: 14 %
Neutro Abs: 8.4 10*3/uL — ABNORMAL HIGH (ref 1.7–7.7)
Neutrophils Relative %: 75 %
Platelets: 177 10*3/uL (ref 150–400)
RBC: 3.43 MIL/uL — AB (ref 3.87–5.11)
RDW: 14.1 % (ref 11.5–15.5)
WBC: 11.3 10*3/uL — AB (ref 4.0–10.5)

## 2017-12-25 LAB — PROTIME-INR
INR: 3.38
PROTHROMBIN TIME: 33.9 s — AB (ref 11.4–15.2)

## 2017-12-25 MED ORDER — AMOXICILLIN-POT CLAVULANATE 875-125 MG PO TABS: 1.0000 | ORAL_TABLET | Freq: Two times a day (BID) | ORAL | 0 refills | Status: AC

## 2017-12-25 NOTE — Progress Notes (Signed)
Patient being discharged from unit to home.Family to provided transportation to home. All d/c instructions reviewed with pt/family including meds, follow up appts, special concerns of when to notify MD. All personal belongings with pt.  No c/o at this time.

## 2017-12-25 NOTE — Discharge Summary (Signed)
Physician Discharge Summary  [name] QQP:[id number] DOB: Oct 19, 1923 DOA: 12/23/2017  PCP: [name], MD  Admit date: 12/23/2017 Discharge date: 12/25/2017  Admitted From: home Disposition:  home  Recommendations for Outpatient Follow-up:  1. Patient has appointment to see PCP on 8/12 at 2:30 PM. She will complete 5 day course of antibiotic on 8/6.  Home Health: none Equipment/Devices: none  Discharge Condition: fair CODE STATUS: full code Diet recommendation: heart healthy    Discharge Diagnoses:  Principal Problem:   Acute gastroenteritis  Active Problems:   Hypothyroidism, goitrous   Coronary artery disease   Aortic valvular stenosis, moderate to severe   Leukocytosis   PAF (paroxysmal atrial fibrillation) (HCC)   Nausea & vomiting   Aspiration pneumonia of left lower lobe due to vomit ([place])   Acute kidney injury  Brief narrative/history of present illness 84 year old female with history of hypothyroidism, CAD, diastolic CHF(EF of 71% ), proximal A. Fib on Coumadin, moderate AS presented with nausea and vomiting on the morning after she ate a peach during breakfast. Patient has had frequent visit to the ED with her husband who was unwell. In the ED vitals were stable. She had significant leukocytosis of 19.7 K,acute kidney injury with creatinine of 1.45. CT of the abdomen and pelvis showed findings of mild enteritis of the small bowel. Also showed patchy nodular density in the lung base on the left possible for infiltrate.patient observed on IVfluids and antibiotics.  Hospital course Acute gastroenteritis, possibly viral Symptoms have resolved since admission. Tolerating advanced diet. Hydrated with IV fluids.leukocytosis markedly improved.  Aspiration pneumonia of the left lower lobe Possibly following frequent vomiting. On empiric Zosyn. Remains afebrile. Leukocytosis improved. We'll discharge her on 5 days of oral Augmentin.  Paroxysmal A. Fib Rate  controlled. Continue metoprolol.  Chronic diastolic CHF Was mild hypovolemic with dehydration. Improved with fluids. Continue beta blocker, statin and Aldactone.  Acute kidney injury  Prerenal, secondary to nausea and vomiting.resolved with hydration.   Elevated troponin Mild. Suspect dementia with acute illness. No chest pain symptoms or EKG changes.No further workup needed.  Moderate aortic stenosis. Current asymptomatic. Can be followed as outpatient by her cardiologist.  Patient clinically stable. Was ablated in the hallway by the nurse and was able to do so without any difficulty and heart rate remained stable. Patient is independent at home and does not use a cane or a walker.can be discharged home with outpatient follow-up.  Procedure: CT abdomen Consults: None signed family communication: None at bedside   Discharge Instructions   Allergies as of 12/25/2017      Reactions   [name] [ethacrynic Acid] Nausea And Vomiting   Tape Other (See Comments)   Tears skin off.  Please use "paper" tape only.   Furosemide Rash   Sulfa Antibiotics Itching, Rash   [name] Rash   Per Dr Sallyanne Kuster, had a skin reaction with thiazides prior to use of Lasix   Torsemide Rash      Medication List    TAKE these medications   acetaminophen 650 MG CR tablet Commonly known as:  TYLENOL Take 650 mg by mouth 3 (three) times daily. Morning, supper and bedtime   amoxicillin-clavulanate 875-125 MG tablet Commonly known as:  AUGMENTIN Take 1 tablet by mouth 2 (two) times daily for 5 days.   atorvastatin 40 MG tablet Commonly known as:  LIPITOR Take 1 tablet (40 mg total) by mouth daily at 6 PM.   folic acid 1 MG tablet Commonly  known as:  FOLVITE Take 2 tablets (2 mg total) by mouth daily.   ipratropium 0.03 % nasal spray Commonly known as:  ATROVENT Place 2 sprays into both nostrils 2 (two) times daily. Use as directed What changed:    when to take this  additional  instructions   levothyroxine 88 MCG tablet Commonly known as:  SYNTHROID, LEVOTHROID Take 1 tablet (88 mcg total) by mouth daily before breakfast.   magnesium hydroxide 400 MG/5ML suspension Commonly known as:  MILK OF MAGNESIA Take 15 mLs by mouth daily as needed for mild constipation.   metoprolol tartrate 25 MG tablet Commonly known as:  LOPRESSOR TAKE 1 TABLET TWICE A DAY   multivitamin with minerals Tabs tablet Take 1 tablet by mouth daily. Centrum Silver   spironolactone 25 MG tablet Commonly known as:  ALDACTONE TAKE 1 TABLET DAILY   warfarin 2.5 MG tablet Commonly known as:  COUMADIN Take as directed. If you are unsure how to take this medication, talk to your nurse or doctor. Original instructions:  TAKE 1 TABLET DAILY OR AS DIRECTED BY COUMADIN CLINIC What changed:  See the new instructions.      Follow-up Information    [name], MD. Go on 01/05/2018.   Specialty:  Family Medicine Why:  @2 :30pm Contact information: [address] [place] [address] [phone]          Allergies  Allergen Reactions  . [name] [Ethacrynic Acid] Nausea And Vomiting  . Tape Other (See Comments)    Tears skin off.  Please use "paper" tape only.  . Furosemide Rash  . Sulfa Antibiotics Itching and Rash  . [name] Rash    Per Dr Sallyanne Kuster, had a skin reaction with thiazides prior to use of Lasix  . Torsemide Rash     Procedures/Studies: Ct Abdomen Pelvis Wo Contrast  Result Date: 12/23/2017 CLINICAL DATA:  84 year old female with generalized weakness with nausea and vomiting that began today. Post appendectomy. Initial encounter. EXAM: CT ABDOMEN AND PELVIS WITHOUT CONTRAST TECHNIQUE: Multidetector CT imaging of the abdomen and pelvis was performed following the standard protocol without IV contrast. COMPARISON:  10/05/2016 CT. FINDINGS: Lower chest: Basilar patchy nodular densities greater on left may be related to aspiration or  infection. Mitral valve calcification. Calcification within the left ventricle probably related to prior insult and without change. Hepatobiliary: Taking into account limitation by non contrast imaging, no worrisome hepatic lesion. 1.2 cm gallstone with prominent size gallbladder without CT evidence of inflammation. If this were of concern ultrasound may be considered. No common bile duct stone. Pancreas: Taking into account limitation by non contrast imaging, no pancreatic mass or inflammation. Spleen: Taking into account limitation by non contrast imaging, no splenic mass or enlargement. Adrenals/Urinary Tract: No obstructing stone or hydronephrosis. Taking into account limitation by non contrast imaging, no worrisome renal or adrenal lesion. Streak artifact limits urinary bladder. No obvious abnormality. Stomach/Bowel: Partially fluid-filled small bowel and colon without extraluminal bowel inflammatory process. Post appendectomy. Secretion filled stomach without abnormality noted. Vascular/Lymphatic: Atherosclerotic changes aorta and aortic branch vessels. No abdominal aortic aneurysm. Scattered normal size lymph nodes without adenopathy. Reproductive: No worrisome adnexal or uterine mass. Other: No free air or bowel containing hernia. Musculoskeletal: Post right hip replacement. Degenerative changes throughout the lower thoracic and lumbar spine. No compression fracture or destructive lesion. Remote fracture inferior right pubic ramus. IMPRESSION: 1. Partially fluid-filled small bowel and colon without extraluminal bowel inflammatory process. This may represent changes of mild enteritis/diarrhea respectively.  2. 1.2 cm gallstone with prominent size gallbladder without CT evidence of inflammation. If this were of concern ultrasound may be considered. 3. Lung base patchy nodular densities greater on left may be related to aspiration or infection. 4.  Aortic Atherosclerosis (ICD10-I70.0). Electronically Signed    By: Genia Del M.D.   On: 12/23/2017 18:49   Dg Chest Port 1 View  Result Date: 12/23/2017 CLINICAL DATA:  Weakness.  Nausea.  Vomiting. EXAM: PORTABLE CHEST 1 VIEW COMPARISON:  06/06/2017. FINDINGS: Mediastinum and hilar structures are stable. Stable cardiomegaly. Stable central pulmonary artery prominence. Pulmonary hypertension may be present. No focal infiltrate. COPD. No pleural effusion or pneumothorax. Previously identified pleural effusion on 06/06/2017 is clear biapical pleural thickening noted consistent scarring. Left shoulder replacement. Degenerative changes thoracic spine. IMPRESSION: 1. Cardiomegaly with prominent central pulmonary artery suggesting pulmonary hypertension. Findings are stable from prior exam. 2. COPD. No acute pulmonary disease. No evidence of pleural effusion noted on today's exam. Electronically Signed   By: [place]   On: 12/23/2017 15:57     Subjective: Tolerating diet.reports feeling better and no further nausea or vomiting.  Discharge Exam: Vitals:   12/24/17 2124 12/25/17 0536  BP:  (!) 127/52  Pulse: 74 71  Resp:  16  Temp:  97.6 F (36.4 C)  SpO2:  95%   Vitals:   12/24/17 1655 12/24/17 1946 12/24/17 2124 12/25/17 0536  BP: (!) 134/112 (!) 99/46  (!) 127/52  Pulse: (!) 141 72 74 71  Resp: 18 16  16   Temp: 98 F (36.7 C) 98.2 F (36.8 C)  97.6 F (36.4 C)  TempSrc: Oral Oral  Oral  SpO2: 95% 98%  95%  Weight:    60.8 kg (134 lb)  Height:        General: elderly female not in distress HEENT: Moist mucosa, supple neck Chest: Clear bilaterally  CVS: S1 and S2 regular, no murmurs rubs or gallop GI: Soft, nondistended, nontender musculoskeletal: Warm, no edema    The results of significant diagnostics from this hospitalization (including imaging, microbiology, ancillary and laboratory) are listed below for reference.     Microbiology: No results found for this or any previous visit (from the past 240 hour(s)).    Labs: BNP (last 3 results) No results for input(s): BNP in the last 8760 hours. Basic Metabolic Panel: Recent Labs  Lab 12/23/17 1601 12/24/17 0606 12/25/17 0340  NA 141 142 140  K 4.4 4.5 4.1  CL 103 109 109  CO2 23 25 24   GLUCOSE 138* 97 97  BUN 32* 34* 27*  CREATININE 1.45* 1.28* 1.07*  CALCIUM 9.6 8.5* 8.4*  MG 2.3  --   --    Liver Function Tests: Recent Labs  Lab 12/23/17 1601 12/24/17 0606  AST 40 26  ALT 35 25  ALKPHOS 85 57  BILITOT 1.2 1.2  PROT 8.3* 6.3*  ALBUMIN 4.4 3.2*   No results for input(s): LIPASE, AMYLASE in the last 168 hours. No results for input(s): AMMONIA in the last 168 hours. CBC: Recent Labs  Lab 12/23/17 1601 12/24/17 0606 12/25/17 0340  WBC 19.7* 12.8* 11.3*  NEUTROABS 17.9*  --  8.4*  HGB 13.7 10.8* 10.2*  HCT 43.9 33.1* 33.1*  MCV 94.4 92.7 96.5  PLT 215 194 177   Cardiac Enzymes: Recent Labs  Lab 12/23/17 1601 12/23/17 2224 12/24/17 0606 12/24/17 0910  TROPONINI <0.03 0.03* 0.03* 0.03*   BNP: Invalid input(s): POCBNP CBG: Recent Labs  Lab 12/25/17 0757  GLUCAP 89   D-Dimer No results for input(s): DDIMER in the last 72 hours. Hgb A1c No results for input(s): HGBA1C in the last 72 hours. Lipid Profile No results for input(s): CHOL, HDL, LDLCALC, TRIG, CHOLHDL, LDLDIRECT in the last 72 hours. Thyroid function studies No results for input(s): TSH, T4TOTAL, T3FREE, THYROIDAB in the last 72 hours.  Invalid input(s): FREET3 Anemia work up No results for input(s): VITAMINB12, FOLATE, FERRITIN, TIBC, IRON, RETICCTPCT in the last 72 hours. Urinalysis    Component Value Date/Time   COLORURINE YELLOW 10/05/2016 2018   APPEARANCEUR CLEAR 10/05/2016 2018   LABSPEC 1.013 10/05/2016 2018   PHURINE 5.0 10/05/2016 2018   GLUCOSEU NEGATIVE 10/05/2016 2018   HGBUR MODERATE (A) 10/05/2016 2018   [place] NEGATIVE 10/05/2016 2018   KETONESUR NEGATIVE 10/05/2016 2018   PROTEINUR NEGATIVE 10/05/2016 2018    UROBILINOGEN 1.0 11/01/2012 1849   NITRITE NEGATIVE 10/05/2016 2018   LEUKOCYTESUR NEGATIVE 10/05/2016 2018   Sepsis Labs Invalid input(s): PROCALCITONIN,  WBC,  LACTICIDVEN Microbiology No results found for this or any previous visit (from the past 240 hour(s)).   Time coordinating discharge:< 30 minutes  SIGNED:   Louellen Molder, MD  [facility] 12/25/2017, 10:53 AM Pager   If 7PM-7AM, please contact night-coverage [email or web address] Password [email or web address]

## 2017-12-25 NOTE — Progress Notes (Signed)
Patient ambulated length of hall with SBA , no walker and tolerated well. Gait steady, no c/o SOB, HR 89-90.

## 2017-12-25 NOTE — Progress Notes (Signed)
ANTICOAGULATION CONSULT NOTE - Initial Consult  Pharmacy Consult for warfarin Indication: atrial fibrillation  Allergies  Allergen Reactions  . [name] [Ethacrynic Acid] Nausea And Vomiting  . Tape Other (See Comments)    Tears skin off.  Please use "paper" tape only.  . Furosemide Rash  . Sulfa Antibiotics Itching and Rash  . [name] Rash    Per Dr Sallyanne Kuster, had a skin reaction with thiazides prior to use of Lasix  . Torsemide Rash    Vital Signs: Temp: 97.6 F (36.4 C) (08/01 0536) Temp Source: Oral (08/01 0536) BP: 127/52 (08/01 0536) Pulse Rate: 71 (08/01 0536)  Labs: Recent Labs    12/23/17 1601 12/23/17 2224 12/24/17 0606 12/24/17 0910 12/25/17 0340  HGB 13.7  --  10.8*  --  10.2*  HCT 43.9  --  33.1*  --  33.1*  PLT 215  --  194  --  177  LABPROT 25.9*  --  30.2*  --  33.9*  INR 2.40  --  2.91  --  3.38  CREATININE 1.45*  --  1.28*  --  1.07*  TROPONINI <0.03 0.03* 0.03* 0.03*  --     Estimated Creatinine Clearance: 28.4 mL/min (A) (by C-G formula based on SCr of 1.07 mg/dL (H)).   Medical History: Past Medical History:  Diagnosis Date  .  Acute respiratiory failure requiring BiPap 06/24/2011  . Allergy   . Aortic valvular stenosis, moderate to severe 06/24/2011   a. 10/2012 Echo: mod AS, Valve 1.25 cm^2 (VTI), 1.19cm^2 (Vmax).  . Arthritis    "back" (10/30/2012)  . Chronic diastolic CHF (congestive heart failure) ([place])    a. 10/2012 Echo: EF 55-60%, no rwma, Gr 2 DD, moderate AS, mod dil LA, mildly to mod dil RA.  Marland Kitchen Coronary artery disease    a. 03/2009 PCI RCA (3.0x18 BMS).  . Dyslipidemia   . Exertional shortness of breath   . Hypertension   . Hypothyroidism   . PAF (paroxysmal atrial fibrillation) ([place])    a. CHA2DS2VASc = 7-->chronic coumadin;  b. s/p DCCV 05/2011, 05/2012, 07/2014;  c. On amio.  . Rash    a. felt to be 2/2 lasix/torsemide in setting of sulfa allergy (lasix d/c ~ 06/2014, torsemide d/c 08/09/2014).   Assessment: [name]  on warfarin for atrial fibrillation. Unable to verify last dose of warfarin PTA her family has already left for the evening. INR therapeutic (2.4) in the ED. INR is in the supratherapeutic today at 3.38. Will hold tonight's coumadin dose.  Per clinic notes her warfarin PTA dose: 1.25mg  Friday and 2.5mg  all other days.   Goal of Therapy:  INR 2-3 Monitor platelets by anticoagulation protocol: Yes   Plan:  Hold Warfarin tonight Daily INR/CBC Monitor for s/sx of bleeding  Corinda Gubler, PharmD 12/25/2017,9:13 AM

## 2017-12-29 ENCOUNTER — Ambulatory Visit (INDEPENDENT_AMBULATORY_CARE_PROVIDER_SITE_OTHER): Payer: Medicare Other | Admitting: Pharmacist

## 2017-12-29 ENCOUNTER — Telehealth: Payer: Self-pay

## 2017-12-29 DIAGNOSIS — Z7901 Long term (current) use of anticoagulants: Secondary | ICD-10-CM | POA: Diagnosis not present

## 2017-12-29 DIAGNOSIS — I480 Paroxysmal atrial fibrillation: Secondary | ICD-10-CM | POA: Diagnosis not present

## 2017-12-29 LAB — POCT INR: INR: 2.7 (ref 2.0–3.0)

## 2017-12-29 NOTE — Telephone Encounter (Signed)
I contacted patient about her hospital follow up that had already been scheduled. I have reconciled the current and discharge medications. Patient was instructed to continue her current medications she was taking prior to admittance and she was given Amoxicillin 875-125 MG taking 1 tablet 2 times daily for 5 days. Patient is knowledgeable of her condition and of all required medications she should be taking. Patient is caring for herself with some assistance from her spouse at home. She has an appointment scheduled for 01/05/18 with her PCP.

## 2017-12-30 ENCOUNTER — Encounter: Payer: Self-pay | Admitting: Family Medicine

## 2017-12-30 ENCOUNTER — Ambulatory Visit (INDEPENDENT_AMBULATORY_CARE_PROVIDER_SITE_OTHER): Payer: Medicare Other | Admitting: Family Medicine

## 2017-12-30 VITALS — BP 126/82 | HR 97 | Temp 97.6°F | Wt 134.6 lb

## 2017-12-30 DIAGNOSIS — I480 Paroxysmal atrial fibrillation: Secondary | ICD-10-CM

## 2017-12-30 DIAGNOSIS — R9389 Abnormal findings on diagnostic imaging of other specified body structures: Secondary | ICD-10-CM | POA: Diagnosis not present

## 2017-12-30 NOTE — Progress Notes (Signed)
   Subjective:    Patient ID: [name], female    DOB: [birth year], 84 y.o.   MRN: [id number]  HPI She is here for recheck after recent hospitalization.  She did have difficulty with nausea and vomiting as well as some diarrhea.  There is also question of an infiltrate with pneumonia possibly from aspiration.  She also has a history of PAF.  She is now seeing her cardiologist for this and is on Coumadin.  Today she is to finish her Augmentin.  Presently she is having no nausea, vomiting, chest pain, shortness of breath.   Review of Systems     Objective:   Physical Exam Alert and in no distress. Tympanic membranes and canals are normal. Pharyngeal area is normal. Neck is supple without adenopathy or thyromegaly. Cardiac exam shows a regular sinus rhythm without murmurs or gallops. Lungs are clear to auscultation.        Assessment & Plan:  PAF (paroxysmal atrial fibrillation) (HCC)  Abnormal chest x-ray At this time she is doing quite well.  She will continue to be followed by her cardiologist and return here for routine checkups.

## 2017-12-30 NOTE — Patient Instructions (Signed)
Use probiotics like yogurt or activity or [place]

## 2018-01-05 ENCOUNTER — Inpatient Hospital Stay: Payer: Self-pay | Admitting: Family Medicine

## 2018-01-07 ENCOUNTER — Other Ambulatory Visit: Payer: Medicare Other

## 2018-01-14 ENCOUNTER — Inpatient Hospital Stay: Payer: Medicare Other | Attending: Hematology and Oncology | Admitting: Hematology and Oncology

## 2018-01-14 ENCOUNTER — Inpatient Hospital Stay: Payer: Medicare Other

## 2018-01-14 VITALS — BP 138/68 | HR 66 | Temp 98.0°F | Resp 18 | Ht 63.5 in | Wt 132.7 lb

## 2018-01-14 DIAGNOSIS — R590 Localized enlarged lymph nodes: Secondary | ICD-10-CM

## 2018-01-14 DIAGNOSIS — C865 Angioimmunoblastic T-cell lymphoma: Secondary | ICD-10-CM | POA: Diagnosis not present

## 2018-01-14 DIAGNOSIS — Z7901 Long term (current) use of anticoagulants: Secondary | ICD-10-CM

## 2018-01-14 DIAGNOSIS — Z79899 Other long term (current) drug therapy: Secondary | ICD-10-CM | POA: Diagnosis not present

## 2018-01-14 DIAGNOSIS — C8440 Peripheral T-cell lymphoma, not classified, unspecified site: Secondary | ICD-10-CM

## 2018-01-14 LAB — CMP (CANCER CENTER ONLY)
ALBUMIN: 3.9 g/dL (ref 3.5–5.0)
ALT: 18 U/L (ref 0–44)
ANION GAP: 6 (ref 5–15)
AST: 23 U/L (ref 15–41)
Alkaline Phosphatase: 88 U/L (ref 38–126)
BUN: 24 mg/dL — ABNORMAL HIGH (ref 8–23)
CO2: 28 mmol/L (ref 22–32)
Calcium: 9.3 mg/dL (ref 8.9–10.3)
Chloride: 107 mmol/L (ref 98–111)
Creatinine: 1.14 mg/dL — ABNORMAL HIGH (ref 0.44–1.00)
GFR, EST AFRICAN AMERICAN: 47 mL/min — AB (ref >60)
GFR, Estimated: 40 mL/min — ABNORMAL LOW (ref >60)
Glucose, Bld: 89 mg/dL (ref 70–99)
POTASSIUM: 5.3 mmol/L — AB (ref 3.5–5.1)
SODIUM: 141 mmol/L (ref 135–145)
TOTAL PROTEIN: 7.5 g/dL (ref 6.5–8.1)
Total Bilirubin: 0.9 mg/dL (ref 0.3–1.2)

## 2018-01-14 LAB — CBC WITH DIFFERENTIAL (CANCER CENTER ONLY)
BASOS ABS: 0 10*3/uL (ref 0.0–0.1)
Basophils Relative: 1 %
Eosinophils Absolute: 0.1 10*3/uL (ref 0.0–0.5)
Eosinophils Relative: 1 %
HEMATOCRIT: 35.2 % (ref 34.8–46.6)
HEMOGLOBIN: 11.5 g/dL — AB (ref 11.6–15.9)
LYMPHS PCT: 17 %
Lymphs Abs: 1.1 10*3/uL (ref 0.9–3.3)
MCH: 29.4 pg (ref 25.1–34.0)
MCHC: 32.6 g/dL (ref 31.5–36.0)
MCV: 90.2 fL (ref 79.5–101.0)
Monocytes Absolute: 0.6 10*3/uL (ref 0.1–0.9)
Monocytes Relative: 9 %
NEUTROS ABS: 4.8 10*3/uL (ref 1.5–6.5)
NEUTROS PCT: 72 %
Platelet Count: 215 10*3/uL (ref 145–400)
RBC: 3.9 MIL/uL (ref 3.70–5.45)
RDW: 14.6 % — ABNORMAL HIGH (ref 11.2–14.5)
WBC Count: 6.7 10*3/uL (ref 3.9–10.3)

## 2018-01-14 NOTE — Assessment & Plan Note (Signed)
Right axillary lymphadenopathy: Detected through with routine screening mammogram status post ultrasound-guided biopsy, 8 mm lymph nodes, atypical lymphoproliferative process suspicious for lymphoma, flow cytometry revealed excess CD4 positive cells suspicious for angioimmunoblastic T-cell lymphoma CT scans 03/29/15: Borderline bil axillary LN and inguinal LN Ct scans 07/06/15: Progressive bil Axillary and inguinal lymphadenopathy  Hospitalization: 11/19 to 04/19/15: Closed Rt Hip Fracture Hospitalization August 2019: Rotavirus infection  Plan:  1. I discussed with her that given her advanced age, I do not recommend aggressive chemotherapy. 2. Options were surveillance versus prednisone therapy. We will continue to dosurveillance  Return to clinic in 1 year with CBC, CMP and LDH

## 2018-01-14 NOTE — Progress Notes (Signed)
Patient Care Team: [name], MD as PCP - General (Family Medicine)  DIAGNOSIS:  Encounter Diagnosis  Name Primary?  . Angioimmunoblastic lymphoma ([place]) Yes    SUMMARY OF ONCOLOGIC HISTORY:   Angioimmunoblastic lymphoma ([place])   03/16/2015 Initial Diagnosis    Left x-ray lymph node biopsy: Atypical lymphoid proliferation suspicious for lymphoma, flow cytometry revealed T cells CD3, CD43 and CD5 associated with BCL-2 expression, suspicious for angioimmunoblastic T-cell lymphoma    03/29/2015 Imaging     Borderline bil axillary LN and inguinal LN    04/15/2015 - 04/19/2015 Hospital Admission    Closed Rt Hip Fracture    07/06/2015 Imaging    Progressive bil Axillary and inguinal lymphadenopathy      CHIEF COMPLIANT: Follow-up of history of angioimmunoblastic lymphoma  INTERVAL HISTORY: [name] is a 84 year old lady with above-mentioned history of angioblastic lymphoma who is here for a six-month follow-up visit.  Recently she was in the hospital with GI illness and was able to recover from that and was discharged home.  Apparently it was a rotavirus infection.  She has been taking care of her 52 year old husband after he has had recent problems with a skin cancer that caused bleeding from the ear.  She denies any fevers chills or night sweats or weight loss.  She continues to have bilateral axillary lymphadenopathy.  REVIEW OF SYSTEMS:   Constitutional: Denies fevers, chills or abnormal weight loss Eyes: Denies blurriness of vision Ears, nose, mouth, throat, and face: Denies mucositis or sore throat Respiratory: Denies cough, dyspnea or wheezes Cardiovascular: Denies palpitation, chest discomfort Gastrointestinal:  Denies nausea, heartburn or change in bowel habits Skin: Denies abnormal skin rashes Lymphatics: Bilateral axillary lymphadenopathy Neurological:Denies numbness, tingling or new weaknesses Behavioral/Psych: Mood is stable, no new changes  Extremities:  No lower extremity edema  All other systems were reviewed with the patient and are negative.  I have reviewed the past medical history, past surgical history, social history and family history with the patient and they are unchanged from previous note.  ALLERGIES:  is allergic to edecrin [ethacrynic acid]; tape; furosemide; sulfa antibiotics; thiazide-type diuretics; and torsemide.  MEDICATIONS:  Current Outpatient Medications  Medication Sig Dispense Refill  . acetaminophen (TYLENOL) 650 MG CR tablet Take 650 mg by mouth 3 (three) times daily. Morning, supper and bedtime    . atorvastatin (LIPITOR) 40 MG tablet Take 1 tablet (40 mg total) by mouth daily at 6 PM. 90 tablet 3  . folic acid (FOLVITE) 1 MG tablet Take 2 tablets (2 mg total) by mouth daily. 180 tablet 3  . ipratropium (ATROVENT) 0.03 % nasal spray Place 2 sprays into both nostrils 2 (two) times daily. Use as directed (Patient taking differently: Place 2 sprays into both nostrils daily. ) 30 mL 5  . levothyroxine (SYNTHROID, LEVOTHROID) 88 MCG tablet Take 1 tablet (88 mcg total) by mouth daily before breakfast. 90 tablet 3  . magnesium hydroxide (MILK OF MAGNESIA) 400 MG/5ML suspension Take 15 mLs by mouth daily as needed for mild constipation. 360 mL 0  . metoprolol tartrate (LOPRESSOR) 25 MG tablet TAKE 1 TABLET TWICE A DAY 180 tablet 1  . Multiple Vitamin (MULTIVITAMIN WITH MINERALS) TABS tablet Take 1 tablet by mouth daily. Centrum Silver    . spironolactone (ALDACTONE) 25 MG tablet TAKE 1 TABLET DAILY 90 tablet 3  . warfarin (COUMADIN) 2.5 MG tablet TAKE 1 TABLET DAILY OR AS DIRECTED BY COUMADIN CLINIC (Patient taking differently: TAKE 1 TABLET all days  except on Friday take 1/2 tablet at night) 90 tablet 1   No current facility-administered medications for this visit.     PHYSICAL EXAMINATION: ECOG PERFORMANCE STATUS: 1 - Symptomatic but completely ambulatory  Vitals:   01/14/18 1350  BP: 138/68  Pulse: 66  Resp: 18    Temp: 98 F (36.7 C)  SpO2: 99%   Filed Weights   01/14/18 1350  Weight: 132 lb 11.2 oz (60.2 kg)    GENERAL:alert, no distress and comfortable SKIN: skin color, texture, turgor are normal, no rashes or significant lesions EYES: normal, Conjunctiva are pink and non-injected, sclera clear OROPHARYNX:no exudate, no erythema and lips, buccal mucosa, and tongue normal  NECK: supple, thyroid normal size, non-tender, without nodularity LYMPH:  no palpable lymphadenopathy in the cervical, axillary or inguinal LUNGS: clear to auscultation and percussion with normal breathing effort HEART: regular rate & rhythm and no murmurs and no lower extremity edema ABDOMEN:abdomen soft, non-tender and normal bowel sounds MUSCULOSKELETAL:no cyanosis of digits and no clubbing  NEURO: alert & oriented x 3 with fluent speech, no focal motor/sensory deficits EXTREMITIES: No lower extremity edema   LABORATORY DATA:  I have reviewed the data as listed CMP Latest Ref Rng & Units 01/14/2018 12/25/2017 12/24/2017  Glucose 70 - 99 mg/dL 89 97 97  BUN 8 - 23 mg/dL 24(H) 27(H) 34(H)  Creatinine 0.44 - 1.00 mg/dL 1.14(H) 1.07(H) 1.28(H)  Sodium 135 - 145 mmol/L 141 140 142  Potassium 3.5 - 5.1 mmol/L 5.3(H) 4.1 4.5  Chloride 98 - 111 mmol/L 107 109 109  CO2 22 - 32 mmol/L 28 24 25   Calcium 8.9 - 10.3 mg/dL 9.3 8.4(L) 8.5(L)  Total Protein 6.5 - 8.1 g/dL 7.5 - 6.3(L)  Total Bilirubin 0.3 - 1.2 mg/dL 0.9 - 1.2  Alkaline Phos 38 - 126 U/L 88 - 57  AST 15 - 41 U/L 23 - 26  ALT 0 - 44 U/L 18 - 25    Lab Results  Component Value Date   WBC 6.7 01/14/2018   HGB 11.5 (L) 01/14/2018   HCT 35.2 01/14/2018   MCV 90.2 01/14/2018   PLT 215 01/14/2018   NEUTROABS 4.8 01/14/2018    ASSESSMENT & PLAN:  Angioimmunoblastic lymphoma ([place]) Right axillary lymphadenopathy: Detected through with routine screening mammogram status post ultrasound-guided biopsy, 8 mm lymph nodes, atypical lymphoproliferative process  suspicious for lymphoma, flow cytometry revealed excess CD4 positive cells suspicious for angioimmunoblastic T-cell lymphoma CT scans 03/29/15: Borderline bil axillary LN and inguinal LN Ct scans 07/06/15: Progressive bil Axillary and inguinal lymphadenopathy  Hospitalization: 11/19 to 04/19/15: Closed Rt Hip Fracture Hospitalization August 2019: Rotavirus infection  Plan:  1. I discussed with her that given her advanced age, I do not recommend aggressive chemotherapy. 2. Options were surveillance versus prednisone therapy. We will continue to dosurveillance  Return to clinic in 1 year with CBC, CMP and LDH      Orders Placed This Encounter  Procedures  . CBC with Differential (Cancer Center Only)    Standing Status:   Future    Standing Expiration Date:   01/15/2019  . CMP ([facility] only)    Standing Status:   Future    Standing Expiration Date:   01/15/2019  . Lactate dehydrogenase (LDH)    Standing Status:   Future    Standing Expiration Date:   01/14/2019   The patient has a good understanding of the overall plan. she agrees with it. she will call with any problems that  may develop before the next visit here.   Harriette Ohara, MD 01/14/18

## 2018-01-16 ENCOUNTER — Telehealth: Payer: Self-pay | Admitting: Hematology and Oncology

## 2018-01-16 NOTE — Telephone Encounter (Signed)
Mailed PT calendar of upcoming appts

## 2018-01-28 ENCOUNTER — Ambulatory Visit (INDEPENDENT_AMBULATORY_CARE_PROVIDER_SITE_OTHER): Payer: Medicare Other | Admitting: Family Medicine

## 2018-01-28 ENCOUNTER — Encounter: Payer: Self-pay | Admitting: Family Medicine

## 2018-01-28 ENCOUNTER — Ambulatory Visit (INDEPENDENT_AMBULATORY_CARE_PROVIDER_SITE_OTHER): Payer: Medicare Other | Admitting: Pharmacist Clinician (PhC)/ Clinical Pharmacy Specialist

## 2018-01-28 VITALS — BP 102/68 | HR 99 | Temp 97.6°F | Wt 134.8 lb

## 2018-01-28 DIAGNOSIS — I480 Paroxysmal atrial fibrillation: Secondary | ICD-10-CM

## 2018-01-28 DIAGNOSIS — Z7901 Long term (current) use of anticoagulants: Secondary | ICD-10-CM | POA: Diagnosis not present

## 2018-01-28 DIAGNOSIS — E071 Dyshormogenetic goiter: Secondary | ICD-10-CM

## 2018-01-28 DIAGNOSIS — E049 Nontoxic goiter, unspecified: Secondary | ICD-10-CM

## 2018-01-28 LAB — POCT INR: INR: 2.4 (ref 2.0–3.0)

## 2018-01-28 NOTE — Patient Instructions (Signed)
Description   Continue taking 1 tablet daily except 1/2 tablet every Friday; Repeat INR in 4 weeks.

## 2018-01-28 NOTE — Progress Notes (Signed)
   Subjective:    Patient ID: [name], female    DOB: [birth year], 84 y.o.   MRN: [id number]  HPI She is here for a recheck.  In May her TSH was low and she is here for repeat.  She has been taking the medicine on an empty stomach and not mixing it with any other meds.   Review of Systems     Objective:   Physical Exam Alert and in no distress otherwise not examined       Assessment & Plan:  Hypothyroidism, goitrous - Plan: TSH May need to readjust her thyroid medication.

## 2018-01-29 LAB — TSH: TSH: 1.65 u[IU]/mL (ref 0.450–4.500)

## 2018-02-03 ENCOUNTER — Other Ambulatory Visit: Payer: Self-pay | Admitting: Cardiovascular Disease

## 2018-02-17 ENCOUNTER — Other Ambulatory Visit: Payer: Self-pay | Admitting: Cardiovascular Disease

## 2018-02-25 ENCOUNTER — Ambulatory Visit (INDEPENDENT_AMBULATORY_CARE_PROVIDER_SITE_OTHER): Payer: Medicare Other | Admitting: Pharmacist

## 2018-02-25 DIAGNOSIS — I480 Paroxysmal atrial fibrillation: Secondary | ICD-10-CM | POA: Diagnosis not present

## 2018-02-25 DIAGNOSIS — Z7901 Long term (current) use of anticoagulants: Secondary | ICD-10-CM

## 2018-02-25 LAB — POCT INR: INR: 2.8 (ref 2.0–3.0)

## 2018-03-11 ENCOUNTER — Other Ambulatory Visit: Payer: Self-pay | Admitting: Cardiovascular Disease

## 2018-03-18 ENCOUNTER — Other Ambulatory Visit: Payer: Self-pay | Admitting: *Deleted

## 2018-03-18 MED ORDER — EZETIMIBE 10 MG PO TABS: ORAL_TABLET | ORAL | 3 refills | Status: DC

## 2018-04-01 ENCOUNTER — Ambulatory Visit (INDEPENDENT_AMBULATORY_CARE_PROVIDER_SITE_OTHER): Payer: Medicare Other | Admitting: Pharmacist

## 2018-04-01 DIAGNOSIS — Z7901 Long term (current) use of anticoagulants: Secondary | ICD-10-CM | POA: Diagnosis not present

## 2018-04-01 DIAGNOSIS — I480 Paroxysmal atrial fibrillation: Secondary | ICD-10-CM | POA: Diagnosis not present

## 2018-04-01 LAB — POCT INR: INR: 3.2 — AB (ref 2.0–3.0)

## 2018-04-25 ENCOUNTER — Inpatient Hospital Stay (HOSPITAL_COMMUNITY)
Admission: EM | Admit: 2018-04-25 | Discharge: 2018-05-01 | DRG: 308 | Disposition: A | Discharge status: Home Health | Payer: Medicare Other | Attending: Internal Medicine | Admitting: Internal Medicine

## 2018-04-25 ENCOUNTER — Encounter (HOSPITAL_COMMUNITY): Payer: Self-pay

## 2018-04-25 ENCOUNTER — Emergency Department (HOSPITAL_COMMUNITY): Payer: Medicare Other

## 2018-04-25 DIAGNOSIS — R0609 Other forms of dyspnea: Secondary | ICD-10-CM

## 2018-04-25 DIAGNOSIS — R54 Age-related physical debility: Secondary | ICD-10-CM | POA: Diagnosis present

## 2018-04-25 DIAGNOSIS — D638 Anemia in other chronic diseases classified elsewhere: Secondary | ICD-10-CM | POA: Diagnosis present

## 2018-04-25 DIAGNOSIS — C8440 Peripheral T-cell lymphoma, not classified, unspecified site: Secondary | ICD-10-CM | POA: Diagnosis present

## 2018-04-25 DIAGNOSIS — I4819 Other persistent atrial fibrillation: Principal | ICD-10-CM | POA: Diagnosis present

## 2018-04-25 DIAGNOSIS — Z9861 Coronary angioplasty status: Secondary | ICD-10-CM

## 2018-04-25 DIAGNOSIS — Z66 Do not resuscitate: Secondary | ICD-10-CM | POA: Diagnosis present

## 2018-04-25 DIAGNOSIS — R0902 Hypoxemia: Secondary | ICD-10-CM | POA: Diagnosis present

## 2018-04-25 DIAGNOSIS — J90 Pleural effusion, not elsewhere classified: Secondary | ICD-10-CM

## 2018-04-25 DIAGNOSIS — Z79899 Other long term (current) drug therapy: Secondary | ICD-10-CM

## 2018-04-25 DIAGNOSIS — Z79890 Hormone replacement therapy (postmenopausal): Secondary | ICD-10-CM

## 2018-04-25 DIAGNOSIS — N183 Chronic kidney disease, stage 3 unspecified: Secondary | ICD-10-CM | POA: Diagnosis present

## 2018-04-25 DIAGNOSIS — I130 Hypertensive heart and chronic kidney disease with heart failure and stage 1 through stage 4 chronic kidney disease, or unspecified chronic kidney disease: Secondary | ICD-10-CM | POA: Diagnosis present

## 2018-04-25 DIAGNOSIS — J189 Pneumonia, unspecified organism: Secondary | ICD-10-CM | POA: Diagnosis present

## 2018-04-25 DIAGNOSIS — Z8249 Family history of ischemic heart disease and other diseases of the circulatory system: Secondary | ICD-10-CM

## 2018-04-25 DIAGNOSIS — R0602 Shortness of breath: Secondary | ICD-10-CM | POA: Diagnosis not present

## 2018-04-25 DIAGNOSIS — Z888 Allergy status to other drugs, medicaments and biological substances status: Secondary | ICD-10-CM

## 2018-04-25 DIAGNOSIS — I5032 Chronic diastolic (congestive) heart failure: Secondary | ICD-10-CM | POA: Diagnosis present

## 2018-04-25 DIAGNOSIS — Z882 Allergy status to sulfonamides status: Secondary | ICD-10-CM

## 2018-04-25 DIAGNOSIS — I2510 Atherosclerotic heart disease of native coronary artery without angina pectoris: Secondary | ICD-10-CM | POA: Diagnosis present

## 2018-04-25 DIAGNOSIS — C8590 Non-Hodgkin lymphoma, unspecified, unspecified site: Secondary | ICD-10-CM | POA: Diagnosis present

## 2018-04-25 DIAGNOSIS — E785 Hyperlipidemia, unspecified: Secondary | ICD-10-CM | POA: Diagnosis present

## 2018-04-25 DIAGNOSIS — I4891 Unspecified atrial fibrillation: Secondary | ICD-10-CM

## 2018-04-25 DIAGNOSIS — E039 Hypothyroidism, unspecified: Secondary | ICD-10-CM | POA: Diagnosis present

## 2018-04-25 DIAGNOSIS — R791 Abnormal coagulation profile: Secondary | ICD-10-CM | POA: Diagnosis present

## 2018-04-25 DIAGNOSIS — D649 Anemia, unspecified: Secondary | ICD-10-CM

## 2018-04-25 DIAGNOSIS — Z823 Family history of stroke: Secondary | ICD-10-CM

## 2018-04-25 DIAGNOSIS — Z7901 Long term (current) use of anticoagulants: Secondary | ICD-10-CM

## 2018-04-25 LAB — URINALYSIS, ROUTINE W REFLEX MICROSCOPIC
BACTERIA UA: NONE SEEN
Bilirubin Urine: NEGATIVE
Glucose, UA: NEGATIVE mg/dL
Ketones, ur: NEGATIVE mg/dL
Leukocytes, UA: NEGATIVE
Nitrite: NEGATIVE
Protein, ur: NEGATIVE mg/dL
Specific Gravity, Urine: 1.019 (ref 1.005–1.030)
pH: 5 (ref 5.0–8.0)

## 2018-04-25 LAB — CBC
HCT: 30.1 % — ABNORMAL LOW (ref 36.0–46.0)
Hemoglobin: 8.9 g/dL — ABNORMAL LOW (ref 12.0–15.0)
MCH: 27.8 pg (ref 26.0–34.0)
MCHC: 29.6 g/dL — ABNORMAL LOW (ref 30.0–36.0)
MCV: 94.1 fL (ref 80.0–100.0)
Platelets: 355 10*3/uL (ref 150–400)
RBC: 3.2 MIL/uL — AB (ref 3.87–5.11)
RDW: 14.6 % (ref 11.5–15.5)
WBC: 9.8 10*3/uL (ref 4.0–10.5)
nRBC: 0 % (ref 0.0–0.2)

## 2018-04-25 LAB — TROPONIN I: Troponin I: <0.03 ng/mL (ref <0.03)

## 2018-04-25 LAB — BASIC METABOLIC PANEL
Anion gap: 11 (ref 5–15)
BUN: 28 mg/dL — ABNORMAL HIGH (ref 8–23)
CO2: 22 mmol/L (ref 22–32)
Calcium: 8.8 mg/dL — ABNORMAL LOW (ref 8.9–10.3)
Chloride: 105 mmol/L (ref 98–111)
Creatinine, Ser: 1.06 mg/dL — ABNORMAL HIGH (ref 0.44–1.00)
GFR calc Af Amer: 53 mL/min — ABNORMAL LOW (ref >60)
GFR, EST NON AFRICAN AMERICAN: 46 mL/min — AB (ref >60)
Glucose, Bld: 109 mg/dL — ABNORMAL HIGH (ref 70–99)
Potassium: 4.5 mmol/L (ref 3.5–5.1)
SODIUM: 138 mmol/L (ref 135–145)

## 2018-04-25 LAB — POC OCCULT BLOOD, ED: FECAL OCCULT BLD: NEGATIVE

## 2018-04-25 LAB — I-STAT TROPONIN, ED: Troponin i, poc: 0.02 ng/mL (ref 0.00–0.08)

## 2018-04-25 LAB — PROTIME-INR
INR: 6.21
Prothrombin Time: 54 seconds — ABNORMAL HIGH (ref 11.4–15.2)

## 2018-04-25 LAB — BRAIN NATRIURETIC PEPTIDE: B Natriuretic Peptide: 232.5 pg/mL — ABNORMAL HIGH (ref 0.0–100.0)

## 2018-04-25 LAB — MRSA PCR SCREENING: MRSA by PCR: NEGATIVE

## 2018-04-25 LAB — TSH: TSH: 0.983 u[IU]/mL (ref 0.350–4.500)

## 2018-04-25 LAB — MAGNESIUM: Magnesium: 2.3 mg/dL (ref 1.7–2.4)

## 2018-04-25 LAB — T4, FREE: Free T4: 1.08 ng/dL (ref 0.82–1.77)

## 2018-04-25 MED ORDER — METOPROLOL TARTRATE 25 MG PO TABS
25.0000 mg | ORAL_TABLET | Freq: Two times a day (BID) | ORAL | Status: DC
  Administered 2018-04-25 – 2018-05-01 (×12): 25 mg via ORAL
  Filled 2018-04-25 (×12): qty 1

## 2018-04-25 MED ORDER — METOPROLOL TARTRATE 25 MG PO TABS
25.0000 mg | ORAL_TABLET | Freq: Once | ORAL | Status: AC
  Administered 2018-04-25: 25 mg via ORAL
  Filled 2018-04-25: qty 1

## 2018-04-25 MED ORDER — DILTIAZEM HCL-DEXTROSE 100-5 MG/100ML-% IV SOLN (PREMIX)
5.0000 mg/h | INTRAVENOUS | Status: DC
  Administered 2018-04-25: 5 mg/h via INTRAVENOUS
  Filled 2018-04-25: qty 100

## 2018-04-25 MED ORDER — SODIUM CHLORIDE 0.9 % IV SOLN
INTRAVENOUS | Status: DC
  Administered 2018-04-25: 09:00:00 via INTRAVENOUS

## 2018-04-25 MED ORDER — DILTIAZEM LOAD VIA INFUSION
10.0000 mg | Freq: Once | INTRAVENOUS | Status: AC
  Administered 2018-04-25: 10 mg via INTRAVENOUS
  Filled 2018-04-25: qty 10

## 2018-04-25 MED ORDER — FOLIC ACID 1 MG PO TABS
2.0000 mg | ORAL_TABLET | Freq: Every day | ORAL | Status: DC
  Administered 2018-04-25 – 2018-05-01 (×7): 2 mg via ORAL
  Filled 2018-04-25 (×7): qty 2

## 2018-04-25 MED ORDER — ACETAMINOPHEN 325 MG PO TABS
650.0000 mg | ORAL_TABLET | ORAL | Status: DC | PRN
  Administered 2018-04-25: 650 mg via ORAL
  Filled 2018-04-25 (×2): qty 2

## 2018-04-25 MED ORDER — ADULT MULTIVITAMIN W/MINERALS CH
1.0000 | ORAL_TABLET | Freq: Every day | ORAL | Status: DC
  Administered 2018-04-25 – 2018-05-01 (×7): 1 via ORAL
  Filled 2018-04-25 (×7): qty 1

## 2018-04-25 MED ORDER — ONDANSETRON HCL 4 MG/2ML IJ SOLN: 4.0000 mg | Freq: Four times a day (QID) | INTRAMUSCULAR | Status: DC | PRN

## 2018-04-25 MED ORDER — ALPRAZOLAM 0.25 MG PO TABS
0.2500 mg | ORAL_TABLET | Freq: Once | ORAL | Status: AC
  Administered 2018-04-25: 0.25 mg via ORAL
  Filled 2018-04-25: qty 1

## 2018-04-25 MED ORDER — LORAZEPAM 0.5 MG PO TABS: 0.5000 mg | ORAL_TABLET | Freq: Once | ORAL | Status: DC

## 2018-04-25 MED ORDER — WARFARIN - PHARMACIST DOSING INPATIENT: Freq: Every day | Status: DC

## 2018-04-25 MED ORDER — EZETIMIBE 10 MG PO TABS
10.0000 mg | ORAL_TABLET | Freq: Every day | ORAL | Status: DC
  Administered 2018-04-25 – 2018-05-01 (×7): 10 mg via ORAL
  Filled 2018-04-25 (×7): qty 1

## 2018-04-25 MED ORDER — IPRATROPIUM BROMIDE 0.06 % NA SOLN
2.0000 | Freq: Every day | NASAL | Status: DC
  Administered 2018-04-26 – 2018-05-01 (×4): 2 via NASAL
  Filled 2018-04-25: qty 15
  Filled 2018-04-25: qty 30

## 2018-04-25 MED ORDER — LORATADINE 10 MG PO TABS
10.0000 mg | ORAL_TABLET | Freq: Every day | ORAL | Status: DC
  Administered 2018-04-25 – 2018-05-01 (×7): 10 mg via ORAL
  Filled 2018-04-25 (×7): qty 1

## 2018-04-25 MED ORDER — VITAMIN C 500 MG PO TABS
500.0000 mg | ORAL_TABLET | Freq: Every day | ORAL | Status: DC
  Administered 2018-04-26 – 2018-05-01 (×6): 500 mg via ORAL
  Filled 2018-04-25 (×7): qty 1

## 2018-04-25 MED ORDER — MAGNESIUM HYDROXIDE 400 MG/5ML PO SUSP: 15.0000 mL | Freq: Every day | ORAL | Status: DC | PRN

## 2018-04-25 MED ORDER — ATORVASTATIN CALCIUM 40 MG PO TABS
40.0000 mg | ORAL_TABLET | Freq: Every day | ORAL | Status: DC
  Administered 2018-04-25 – 2018-05-01 (×7): 40 mg via ORAL
  Filled 2018-04-25 (×7): qty 1

## 2018-04-25 MED ORDER — DILTIAZEM HCL-DEXTROSE 100-5 MG/100ML-% IV SOLN (PREMIX)
5.0000 mg/h | INTRAVENOUS | Status: DC
  Administered 2018-04-25 (×2): 5 mg/h via INTRAVENOUS
  Filled 2018-04-25 (×2): qty 100

## 2018-04-25 MED ORDER — LEVOTHYROXINE SODIUM 88 MCG PO TABS
88.0000 ug | ORAL_TABLET | Freq: Every day | ORAL | Status: DC
  Administered 2018-04-25 – 2018-05-01 (×7): 88 ug via ORAL
  Filled 2018-04-25 (×7): qty 1

## 2018-04-25 NOTE — Progress Notes (Signed)
ANTICOAGULATION CONSULT NOTE - Initial Consult  Pharmacy Consult:  Coumadin Indication: atrial fibrillation  Allergies  Allergen Reactions  . [name] [Ethacrynic Acid] Nausea And Vomiting  . Furosemide Rash  . Sulfa Antibiotics Itching and Rash  . Tape Rash and Other (See Comments)    Tears skin off.  Please use "paper" tape only.   . [name] Rash    Per Dr Sallyanne Kuster, had a skin reaction with thiazides prior to use of Lasix  . Torsemide Rash    Patient Measurements: Height: 5\' 3"  (160 cm) Weight: 131 lb (59.4 kg) IBW/kg (Calculated) : 52.4  Vital Signs: Temp: 97.9 F (36.6 C) (11/30 0831) Temp Source: Oral (11/30 0831) BP: 109/79 (11/30 1030) Pulse Rate: 72 (11/30 1030)  Labs: Recent Labs    04/25/18 0834  HGB 8.9*  HCT 30.1*  PLT 355  LABPROT 54.0*  INR 6.21*  CREATININE 1.06*    Estimated Creatinine Clearance: 28 mL/min (A) (by C-G formula based on SCr of 1.06 mg/dL (H)).   Medical History: Past Medical History:  Diagnosis Date  .  Acute respiratiory failure requiring BiPap 06/24/2011  . Allergy   . Aortic valvular stenosis, moderate to severe 06/24/2011   a. 10/2012 Echo: mod AS, Valve 1.25 cm^2 (VTI), 1.19cm^2 (Vmax).  . Arthritis    "back" (10/30/2012)  . Chronic diastolic CHF (congestive heart failure) ([place])    a. 10/2012 Echo: EF 55-60%, no rwma, Gr 2 DD, moderate AS, mod dil LA, mildly to mod dil RA.  Marland Kitchen Coronary artery disease    a. 03/2009 PCI RCA (3.0x18 BMS).  . Dyslipidemia   . Exertional shortness of breath   . Hypertension   . Hypothyroidism   . PAF (paroxysmal atrial fibrillation) ([place])    a. CHA2DS2VASc = 7-->chronic coumadin;  b. s/p DCCV 05/2011, 05/2012, 07/2014;  c. On amio.  . Rash    a. felt to be 2/2 lasix/torsemide in setting of sulfa allergy (lasix d/c ~ 06/2014, torsemide d/c 08/09/2014).      Assessment: 53 YOF presented with weakness, SOB and palpitations.  Patient is in Afib with RVR and started on diltiazem  infusion.  Pharmacy consulted to manage Coumadin from PTA.  INR is supra-therapeutic on admit; FOB negative.  Home Coumadin dose: 2.5mg  daily except 1.25 on Fri, last dose 11/28  Goal of Therapy:  INR 2-3 Monitor platelets by anticoagulation protocol: Yes   Plan:  Hold Coumadin today Daily PT / INR   [name] D. Mina Marble, PharmD, BCPS, [place] 04/25/2018, 11:14 AM

## 2018-04-25 NOTE — H&P (Signed)
History and Physical    [name] CZY:[id number] DOB: 1923/06/05 DOA: 04/25/2018  PCP: [name], MD Consultants:  Lindi Adie - oncology; Croitoru - cardiology Patient coming from:  Home - lives with husband; NOK: Husband, [phone]  Chief Complaint: Weakness  HPI: [name] is a 83 y.o. female with medical history significant of afib on Coumadin; hypothyroidism; HTN; HLD; CAD s/p PCI; and chronic diastolic CHF presenting with weakness.  "Well, my heart was a running with me and I knew it."  She was SOB.  Her chest was uncomfortable without clear pain.    She has not had this problem  In a long time.  She has been shocked in the past.  She feels better now and is not SOB at rest.  She has been feeling bad for about a week, had a dripping nose not improved with Claritin.  She might have over-eaten a bit.     ED Course:  Afib with RVR.  She has had weakness for a week. This AM with CP and SOB.  Worsening symptoms with exertion.  Compliant with medications.  Hgb 8.9, heme negative.  CXR with moderate L pleural effusion.  Given metoprolol and started on Cardizem drip with improvement in HR and symptoms.  INR is 6.21.  Review of Systems: As per HPI; otherwise review of systems reviewed and negative.   Ambulatory Status:  Ambulates without assistance  Past Medical History:  Diagnosis Date  .  Acute respiratiory failure requiring BiPap 06/24/2011  . Allergy   . Aortic valvular stenosis, moderate to severe 06/24/2011   a. 10/2012 Echo: mod AS, Valve 1.25 cm^2 (VTI), 1.19cm^2 (Vmax).  . Arthritis    "back" (10/30/2012)  . Chronic diastolic CHF (congestive heart failure) ([place])    a. 10/2012 Echo: EF 55-60%, no rwma, Gr 2 DD, moderate AS, mod dil LA, mildly to mod dil RA.  Marland Kitchen Coronary artery disease    a. 03/2009 PCI RCA (3.0x18 BMS).  . Dyslipidemia   . Exertional shortness of breath   . Hypertension   . Hypothyroidism   . PAF (paroxysmal atrial fibrillation) ([place])    a.  CHA2DS2VASc = 7-->chronic coumadin;  b. s/p DCCV 05/2011, 05/2012, 07/2014;  c. On amio.  . Rash    a. felt to be 2/2 lasix/torsemide in setting of sulfa allergy (lasix d/c ~ 06/2014, torsemide d/c 08/09/2014).    Past Surgical History:  Procedure Laterality Date  . ANGIOPLASTY    . APPENDECTOMY    . BACK SURGERY    . CARDIAC CATHETERIZATION  10/06/2009   Continue medical therapy  . CARDIAC CATHETERIZATION  04/07/2009   RCA stented with a 3x2mm stent resulting in a reduction of 90% narrowing to normal  . CARDIOVERSION  06/19/2011   Procedure: CARDIOVERSION;  Surgeon: Sanda Klein, MD;  Location: [facility];  Service: Cardiovascular;  Laterality: N/A;  . CARDIOVERSION  07/11/2010   Successful coversion to sinus rhythm  . CARDIOVERSION N/A 08/10/2014   Procedure: CARDIOVERSION;  Surgeon: Sanda Klein, MD;  Location: [facility];  Service: Cardiovascular;  Laterality: N/A;  . CATARACT EXTRACTION W/ INTRAOCULAR LENS  IMPLANT, BILATERAL    . CORONARY ANGIOPLASTY WITH STENT PLACEMENT     "1" (10/30/2012)  . DILATION AND CURETTAGE OF UTERUS    . EXCISIONAL HEMORRHOIDECTOMY    . FRACTURE SURGERY    . HIP ARTHROPLASTY Right 04/17/2015   Procedure: HIP MONOPOLAR HIP HEMI ARTHROPLASTY;  Surgeon: Marybelle Killings, MD;  Location: [facility];  Service: Orthopedics;  Laterality: Right;  . LUMBAR DISC SURGERY  X 2   "cleaned out scar tissue"   . SHOULDER ACROMIOPLASTY Left    "fell; put a new ball in" (10/30/2012)  . WRIST FRACTURE SURGERY Left     Social History   Socioeconomic History  . Marital status: Married    Spouse name: Not on file  . Number of children: 1  . Years of education: Not on file  . Highest education level: Not on file  Occupational History  . Occupation: Retired    Fish farm manager: [employer]  Social Needs  . Financial resource strain: Not on file  . Food insecurity:    Worry: Not on file    Inability: Not on file  . Transportation needs:    Medical: Not on file    Non-medical:  Not on file  Tobacco Use  . Smoking status: Never Smoker  . Smokeless tobacco: Never Used  Substance and Sexual Activity  . Alcohol use: No  . Drug use: No  . Sexual activity: Never    Birth control/protection: Abstinence  Lifestyle  . Physical activity:    Days per week: Not on file    Minutes per session: Not on file  . Stress: Not on file  Relationships  . Social connections:    Talks on phone: Not on file    Gets together: Not on file    Attends religious service: Not on file    Active member of club or organization: Not on file    Attends meetings of clubs or organizations: Not on file    Relationship status: Not on file  . Intimate partner violence:    Fear of current or ex partner: Not on file    Emotionally abused: Not on file    Physically abused: Not on file    Forced sexual activity: Not on file  Other Topics Concern  . Not on file  Social History Narrative   Lives in [place], [place]. She got married in 1948.    Allergies  Allergen Reactions  . [name] [Ethacrynic Acid] Nausea And Vomiting  . Furosemide Rash  . Sulfa Antibiotics Itching and Rash  . Tape Rash and Other (See Comments)    Tears skin off.  Please use "paper" tape only.   . [name] Rash    Per Dr Sallyanne Kuster, had a skin reaction with thiazides prior to use of Lasix  . Torsemide Rash    Family History  Problem Relation Age of Onset  . Heart disease Brother   . Heart disease Brother   . Lung disease Brother   . Heart disease Mother 68  . Cancer Mother 12       Uterine  . Stroke Mother 79  . Heart failure Mother   . Hypertension Mother   . Heart disease Father   . Heart attack Father   . Hypertension Father   . Hypertension Child 34  . Alcohol abuse Child   . Cancer Son   . Hypertension Sister   . Hypertension Brother     Prior to Admission medications   Medication Sig Start Date End Date Taking? Authorizing Provider  atorvastatin (LIPITOR) 40 MG tablet Take 1 tablet (40  mg total) by mouth daily at 6 PM. 11/26/17  Yes Croitoru, Mihai, MD  ezetimibe (ZETIA) 10 MG tablet TAKE 1 TABLET DAILY FOR HLD Patient taking differently: Take 10 mg by mouth daily.  03/18/18  Yes Croitoru, Dani Gobble, MD  folic  acid (FOLVITE) 1 MG tablet TAKE 2 TABLETS DAILY Patient taking differently: Take 2 mg by mouth daily.  02/03/18  Yes Croitoru, Mihai, MD  ipratropium (ATROVENT) 0.03 % nasal spray Place 2 sprays into both nostrils 2 (two) times daily. Use as directed Patient taking differently: Place 2 sprays into both nostrils daily.  02/10/17  Yes [name], MD  levothyroxine (SYNTHROID, LEVOTHROID) 88 MCG tablet Take 1 tablet (88 mcg total) by mouth daily before breakfast. 10/24/17  Yes [name], MD  loratadine (CLARITIN) 10 MG tablet Take 10 mg by mouth daily.   Yes [provider]  magnesium hydroxide (MILK OF MAGNESIA) 400 MG/5ML suspension Take 15 mLs by mouth daily as needed for mild constipation. 10/08/16  Yes Lavina Hamman, MD  metoprolol tartrate (LOPRESSOR) 25 MG tablet TAKE 1 TABLET TWICE A DAY Patient taking differently: Take 25 mg by mouth 2 (two) times daily.  02/17/18  Yes Croitoru, Mihai, MD  multivitamin-iron-minerals-folic acid (CENTRUM) chewable tablet Chew 1 tablet by mouth daily.   Yes [provider]  OVER THE COUNTER MEDICATION Take 1 tablet by mouth See admin instructions. Stool softener over the counter by mouth daily   Yes [provider]  OVER THE COUNTER MEDICATION Take 1 tablet by mouth See admin instructions. Over the counter pill for osteoarthritis by mouth three time daily   Yes [provider]  vitamin C (ASCORBIC ACID) 500 MG tablet Take 500 mg by mouth daily.   Yes [provider]  warfarin (COUMADIN) 2.5 MG tablet TAKE 1 TABLET DAILY OR AS DIRECTED BY COUMADIN CLINIC Patient taking differently: Take 2.5 mg by mouth See admin instructions. Take 1 tablet (2.5 mg) by mouth once daily at 6 PM during the week,  except Friday. 11/06/17  Yes Croitoru, Mihai, MD  acetaminophen (TYLENOL) 650 MG CR tablet Take 650 mg by mouth 3 (three) times daily. Morning, supper and bedtime    [provider]  spironolactone (ALDACTONE) 25 MG tablet TAKE 1 TABLET DAILY Patient not taking: No sig reported 11/19/17   Croitoru, Dani Gobble, MD    Physical Exam: Vitals:   04/25/18 1000 04/25/18 1015 04/25/18 1030 04/25/18 1137  BP: 115/70 101/73 109/79 118/67  Pulse: 99 78 72 95  Resp: (!) 24 (!) 24 (!) 27 17  Temp:      TempSrc:      SpO2: 97% 97% 96% 98%  Weight:      Height:         General:  Appears calm and comfortable and is NAD Eyes:  PERRL, EOMI, normal lids, iris ENT:  Mildly hard of hearing, normal lips & tongue, mmm Neck:  no LAD, masses or thyromegaly Cardiovascular:  Irregularly irregular, rate controlled on Dilt drip, no m/r/g. 1+ LE edema.  Respiratory:   CTA bilaterally with no wheezes/rales/rhonchi.  Normal respiratory effort. Abdomen:  soft, NT, ND, NABS Skin:  no rash or induration seen on limited exam Musculoskeletal:  grossly normal tone BUE/BLE, good ROM, no bony abnormality Psychiatric:  grossly normal mood and affect, speech fluent and appropriate, AOx3 Neurologic:  CN 2-12 grossly intact, moves all extremities in coordinated fashion, sensation intact    Radiological Exams on Admission: Dg Chest Port 1 View  Result Date: 04/25/2018 CLINICAL DATA:  Generalized weakness for 1 week. EXAM: PORTABLE CHEST 1 VIEW COMPARISON:  12/23/2017 FINDINGS: Enlarged cardiac silhouette.  Mediastinal contours appear intact. There is no evidence of pneumothorax. Moderate in size left pleural effusion. Left lower lobe  airspace consolidation versus atelectasis. Chronic biapical scarring. Osseous structures are without acute abnormality. Soft tissues are grossly normal. IMPRESSION: Moderate in size left pleural effusion. Left lower lobe airspace consolidation versus atelectasis. Enlarged cardiac silhouette.  Electronically Signed   By: Fidela Salisbury M.D.   On: 04/25/2018 09:21    EKG: Independently reviewed.   0826- Afib with rate 153; LBBB  1002- Afib with rate 96; LBBB with no evidence of acute ischemia  Labs on Admission: I have personally reviewed the available labs and imaging studies at the time of the admission.  Pertinent labs:   Glucose 109 BUN 28/Creatinine 1.06/GFR 46 - stable Troponin 0.02 WBC 9.8 Hgb 8.9 - baseline 10-11 INR 6.21 Normal thyroid studies Heme negative  Assessment/Plan Principal Problem:   Atrial fibrillation with RVR (HCC) Active Problems:   Hyperlipidemia   Pleural effusion   Angioimmunoblastic lymphoma (HCC)   CKD (chronic kidney disease) stage 3, GFR 30-59 ml/min (HCC)   Anemia   Supratherapeutic INR   Afib with RVR -Patient presenting with recurrent afib, new RVR -Will admit to SDU for Diltiazem drip as per protocol with plan to transition to PO Diltiazem once heart rate is controlled.   -Troponin q6h x 3 although low suspicion for ACS -Repeat EKG in AM -Consider cardiology consult if symptoms are managed with current plan. -Heart rate is currently well controlled. -CHA2DS2-VASc Score is >2 and so patient needs oral anticoagulation; her INR is currently supratherapeutic.  Supratherapeutic INR -Uncertain reason for this -INR was previously stable but increased to 3.2 on 11/6 and further to 6.21 today -Patient is not actively bleeding and does not appear to need rapid reversal -Will order Coumadin dosing per pharmacy at this time  Anemia -She has baseline chronic anemia but currently this is mildly worse -Given her supratherapeutic INR, this is somewhat worrisome -Will recheck CBC in AM to ensure stability and closely monitor for evidence of bleeding  Pleural effusion -She had a small effusion on the left in 9/16 -Now with moderate size effusion -Denies infectious symptoms -Suspect this is related to mild CHF in the setting of  afib with RVR; malignant effusion is also a consideration given her h/o lymphoma -Repeat CXR tomorrow AM (or sooner if SOB worsens again) -If she improves symptomatically and the effusion is not larger tomorrow, no further intervention may be needed; however, if the effusion is growing larger and/or the patient's symptoms are not improving with rate control, she may need thoracentesis  Lymphoma -Last seen by Dr. Lindi Adie in 8/19 -Given her advanced age, chemotherapy was not recommended -Surveillance was the treatment selected -Will follow  Stage 3 CKD -Appears to be stable -Recheck BMP in AM  HLD -Continue Lipitor and Zetia   DVT prophylaxis: Coumadin Code Status: DNR - confirmed with patient/family Family Communication: Husband, daughter (in-law?) were present throughout evaluation  Disposition Plan:  Home once clinically improved Consults called: None  Admission status: It is my clinical opinion that referral for OBSERVATION is reasonable and necessary in this patient based on the above information provided. The aforementioned taken together are felt to place the patient at high risk for further clinical deterioration. However it is anticipated that the patient may be medically stable for discharge from the hospital within 24 to 48 hours.    Karmen Bongo MD [facility]  If note is complete, please contact covering daytime or nighttime physician. [email or web address] Password [email or web address]  04/25/2018, 2:51 PM

## 2018-04-25 NOTE — ED Provider Notes (Signed)
    Pattricia Boss, MD 04/26/18 [phone]

## 2018-04-25 NOTE — ED Triage Notes (Signed)
Pt from home via ems; c/o generalized weakness x 1 week; woke this am around 0600 w/ sob, palpitations, and chest tightness; found to be in afib rvr w/ ems, 150-180; hx afib, not on medication for afib currently; EMS 12 lead showing L BBB and elevation v2, v3, v4; pt given 324 ASA and 1 Nitro PTA  126/78 96% RA RR 18 HR 56

## 2018-04-25 NOTE — ED Notes (Signed)
EDP notified of elevated INR

## 2018-04-25 NOTE — ED Provider Notes (Signed)
[facility] EMERGENCY DEPARTMENT Provider Note   CSN: [id number] Arrival date & time: 04/25/18  0818     History   Chief Complaint Chief Complaint  Patient presents with  . Weakness  . Atrial Fibrillation    HPI [name] is a 84 y.o. female with history of chronic diastolic heart failure on spironolactone, CAD, moderate aortic stenosis, persistent atrial fibrillation on warfarin, hyperlipidemia, angioimmunoblastic lymphoma treated with surveillance, is brought to the ER by EMS for evaluation of generalized weakness for 1 week.  Patient called EMS this morning because at 6 AM she woke up and felt short of breath, she walked to her recliner and had sudden onset chest "pressure", worsening shortness of breath, palpitations.  States she knows its her heart because she has had similar palpitations in the past when her heart is beating too fast. She has had cardioversions in the past.  Her chest discomfort has improved after receiving aspirin and nitroglycerin by EMS but she continues to feel short of breath.  Has had a runny nose for the last few days. She has history of ankle edema and states that her swelling does not look any different to her. She has been taking all of her medications as prescribed including warfarin. She was sure to eat prudently during thanksgiving and did not use a lot of salt.   She denies recent fevers, chills, cough, nausea, vomiting, light-headedness, syncope, abdominal pain, changes in BM. She lives with her 50 yo husband, they have been married for 63 years. Son on the way to ER.  HPI  Past Medical History:  Diagnosis Date  .  Acute respiratiory failure requiring BiPap 06/24/2011  . Allergy   . Aortic valvular stenosis, moderate to severe 06/24/2011   a. 10/2012 Echo: mod AS, Valve 1.25 cm^2 (VTI), 1.19cm^2 (Vmax).  . Arthritis    "back" (10/30/2012)  . Chronic diastolic CHF (congestive heart failure) ([place])    a. 10/2012 Echo: EF 55-60%, no  rwma, Gr 2 DD, moderate AS, mod dil LA, mildly to mod dil RA.  Marland Kitchen Coronary artery disease    a. 03/2009 PCI RCA (3.0x18 BMS).  . Dyslipidemia   . Exertional shortness of breath   . Hypertension   . Hypothyroidism   . PAF (paroxysmal atrial fibrillation) ([place])    a. CHA2DS2VASc = 7-->chronic coumadin;  b. s/p DCCV 05/2011, 05/2012, 07/2014;  c. On amio.  . Rash    a. felt to be 2/2 lasix/torsemide in setting of sulfa allergy (lasix d/c ~ 06/2014, torsemide d/c 08/09/2014).    Patient Active Problem List   Diagnosis Date Noted  . Acute gastroenteritis 12/25/2017  . Aspiration pneumonia of left lower lobe due to vomit ([place]) 12/25/2017  . Nausea & vomiting 12/23/2017  . Pressure injury of skin 10/03/2016  . Allergy to sulfa drugs 10/02/2016  . Long term current use of anticoagulant therapy 05/12/2015  . History of fracture of right hip 04/15/2015  . Senile purpura ([place]) 04/13/2015  . Angioimmunoblastic lymphoma ([place]) 04/05/2015  . PAF (paroxysmal atrial fibrillation) ([place])   . Arthritis 09/13/2014  . Intermittent LBBB 08/16/2014  . Drug-induced bradycardia 02/20/2014  . Prolonged Q-T interval on ECG 11/02/2012  . Hypotension 11/01/2012  . Leukocytosis 11/01/2012  . Aortic valvular stenosis, moderate to severe 06/24/2011  . Hyperlipidemia 06/21/2011  . Hypertensive heart disease with CHF (congestive heart failure) ([place]) 06/21/2011  . Hypothyroidism, goitrous 06/21/2011  . Coronary artery disease 06/21/2011    Past Surgical History:  Procedure Laterality Date  . ANGIOPLASTY    . APPENDECTOMY    . BACK SURGERY    . CARDIAC CATHETERIZATION  10/06/2009   Continue medical therapy  . CARDIAC CATHETERIZATION  04/07/2009   RCA stented with a 3x67mm stent resulting in a reduction of 90% narrowing to normal  . CARDIOVERSION  06/19/2011   Procedure: CARDIOVERSION;  Surgeon: Sanda Klein, MD;  Location: [facility] OR;  Service: Cardiovascular;  Laterality: N/A;  . CARDIOVERSION  07/11/2010    Successful coversion to sinus rhythm  . CARDIOVERSION N/A 08/10/2014   Procedure: CARDIOVERSION;  Surgeon: Sanda Klein, MD;  Location: [facility];  Service: Cardiovascular;  Laterality: N/A;  . CATARACT EXTRACTION W/ INTRAOCULAR LENS  IMPLANT, BILATERAL    . CORONARY ANGIOPLASTY WITH STENT PLACEMENT     "1" (10/30/2012)  . DILATION AND CURETTAGE OF UTERUS    . EXCISIONAL HEMORRHOIDECTOMY    . FRACTURE SURGERY    . HIP ARTHROPLASTY Right 04/17/2015   Procedure: HIP MONOPOLAR HIP HEMI ARTHROPLASTY;  Surgeon: Marybelle Killings, MD;  Location: [facility];  Service: Orthopedics;  Laterality: Right;  . LUMBAR DISC SURGERY  X 2   "cleaned out scar tissue"   . SHOULDER ACROMIOPLASTY Left    "fell; put a new ball in" (10/30/2012)  . WRIST FRACTURE SURGERY Left      OB History   None      Home Medications    Prior to Admission medications   Medication Sig Start Date End Date Taking? Authorizing Provider  atorvastatin (LIPITOR) 40 MG tablet Take 1 tablet (40 mg total) by mouth daily at 6 PM. 11/26/17  Yes Croitoru, Mihai, MD  ezetimibe (ZETIA) 10 MG tablet TAKE 1 TABLET DAILY FOR HLD Patient taking differently: Take 10 mg by mouth daily.  03/18/18  Yes Croitoru, Mihai, MD  folic acid (FOLVITE) 1 MG tablet TAKE 2 TABLETS DAILY Patient taking differently: Take 2 mg by mouth daily.  02/03/18  Yes Croitoru, Mihai, MD  ipratropium (ATROVENT) 0.03 % nasal spray Place 2 sprays into both nostrils 2 (two) times daily. Use as directed Patient taking differently: Place 2 sprays into both nostrils daily.  02/10/17  Yes [name], MD  levothyroxine (SYNTHROID, LEVOTHROID) 88 MCG tablet Take 1 tablet (88 mcg total) by mouth daily before breakfast. 10/24/17  Yes [name], MD  loratadine (CLARITIN) 10 MG tablet Take 10 mg by mouth daily.   Yes [provider]  magnesium hydroxide (MILK OF MAGNESIA) 400 MG/5ML suspension Take 15 mLs by mouth daily as needed for mild constipation. 10/08/16  Yes Lavina Hamman, MD  metoprolol tartrate (LOPRESSOR) 25 MG tablet TAKE 1 TABLET TWICE A DAY Patient taking differently: Take 25 mg by mouth 2 (two) times daily.  02/17/18  Yes Croitoru, Mihai, MD  multivitamin-iron-minerals-folic acid (CENTRUM) chewable tablet Chew 1 tablet by mouth daily.   Yes [provider]  OVER THE COUNTER MEDICATION Take 1 tablet by mouth See admin instructions. Stool softener over the counter by mouth daily   Yes [provider]  OVER THE COUNTER MEDICATION Take 1 tablet by mouth See admin instructions. Over the counter pill for osteoarthritis by mouth three time daily   Yes [provider]  vitamin C (ASCORBIC ACID) 500 MG tablet Take 500 mg by mouth daily.   Yes [provider]  warfarin (COUMADIN) 2.5 MG tablet TAKE 1 TABLET DAILY OR AS DIRECTED BY COUMADIN CLINIC Patient taking differently: Take 2.5 mg by mouth  See admin instructions. Take 1 tablet (2.5 mg) by mouth once daily at 6 PM during the week, except Friday. 11/06/17  Yes Croitoru, Mihai, MD  acetaminophen (TYLENOL) 650 MG CR tablet Take 650 mg by mouth 3 (three) times daily. Morning, supper and bedtime    [provider]  spironolactone (ALDACTONE) 25 MG tablet TAKE 1 TABLET DAILY Patient not taking: No sig reported 11/19/17   Croitoru, Mihai, MD    Family History Family History  Problem Relation Age of Onset  . Heart disease Brother   . Heart disease Brother   . Lung disease Brother   . Heart disease Mother 39  . Cancer Mother 33       Uterine  . Stroke Mother 11  . Heart failure Mother   . Hypertension Mother   . Heart disease Father   . Heart attack Father   . Hypertension Father   . Hypertension Child 34  . Alcohol abuse Child   . Cancer Son   . Hypertension Sister   . Hypertension Brother     Social History Social History   Tobacco Use  . Smoking status: Never Smoker  . Smokeless tobacco: Never Used  Substance Use Topics  . Alcohol use: No  .  Drug use: No     Allergies   [name] [ethacrynic acid]; Furosemide; Sulfa antibiotics; Tape; [name]; and Torsemide   Review of Systems Review of Systems  Respiratory: Positive for shortness of breath.   Cardiovascular: Positive for chest pain (pressure, improved) and leg swelling (chronic).  Neurological: Positive for weakness.  Hematological: Bruises/bleeds easily.  All other systems reviewed and are negative.  Physical Exam Updated Vital Signs BP 107/75   Pulse 96   Temp 97.9 F (36.6 C) (Oral)   Resp (!) 23   Ht 5\' 3"  (1.6 m)   Wt 59.4 kg   SpO2 96%   BMI 23.21 kg/m   Physical Exam  Constitutional: She is oriented to person, place, and time. She appears well-developed and well-nourished.  Non toxic.  No distress.  Awake.  HENT:  Head: Normocephalic and atraumatic.  Nose: Nose normal.  Moist mucous membranes.  Eyes: Pupils are equal, round, and reactive to light. Conjunctivae and EOM are normal.  Neck: Normal range of motion.  Cardiovascular: An irregularly irregular rhythm present. Tachycardia present.  Murmur heard. Pulses:      Radial pulses are 1+ on the right side, and 1+ on the left side.       Dorsalis pedis pulses are 1+ on the right side, and 1+ on the left side.  Atrial fibrillation with RVR heart rate in the 115s to 130s.  Trace pitting edema to ankles right greater than left.  No calf tenderness.  Systolic murmur.  Pulmonary/Chest: Effort normal. She has decreased breath sounds in the right lower field and the left lower field.  Borderline tachypnea.  No overt signs of significant respiratory distress.  Speaking in full sentences.  Diminished lung sounds to lower lobes bilaterally.  Abdominal: Soft. Bowel sounds are normal. There is no tenderness.  No G/R/R. No suprapubic or CVA tenderness. Negative Murphy's and McBurney's. Active BS to lower quadrants.   Genitourinary: Rectal exam shows guaiac negative stool. Pelvic exam was performed  with patient in the knee-chest position.  Genitourinary Comments: Patient with no pain in perianal/rectal area with palpation.  There are no external fissures or external hemorrhoids noted.  No induration or swelling of the perianal skin.   Stool color  is brown with no gross blood noted.   DRE reveals good sphincter tone.   Patient able to tolerate examination.   Musculoskeletal: Normal range of motion.  Neurological: She is alert and oriented to person, place, and time.  Skin: Skin is warm and dry. Capillary refill takes less than 2 seconds.  Psychiatric: She has a normal mood and affect. Her behavior is normal.  Nursing note and vitals reviewed.    ED Treatments / Results  Labs (all labs ordered are listed, but only abnormal results are displayed) Labs Reviewed  BASIC METABOLIC PANEL - Abnormal; Notable for the following components:      Result Value   Glucose, Bld 109 (*)    BUN 28 (*)    Creatinine, Ser 1.06 (*)    Calcium 8.8 (*)    GFR calc non Af Amer 46 (*)    GFR calc Af Amer 53 (*)    All other components within normal limits  CBC - Abnormal; Notable for the following components:   RBC 3.20 (*)    Hemoglobin 8.9 (*)    HCT 30.1 (*)    MCHC 29.6 (*)    All other components within normal limits  PROTIME-INR - Abnormal; Notable for the following components:   Prothrombin Time 54.0 (*)    INR 6.21 (*)    All other components within normal limits  URINE CULTURE  MAGNESIUM  TSH  T4, FREE  BRAIN NATRIURETIC PEPTIDE  URINALYSIS, ROUTINE W REFLEX MICROSCOPIC  I-STAT TROPONIN, ED  POC OCCULT BLOOD, ED    EKG EKG Interpretation  Date/Time:  Saturday April 25 2018 10:02:19 EST Ventricular Rate:  96 PR Interval:    QRS Duration: 131 QT Interval:  425 QTC Calculation: 538 R Axis:   -59 Text Interpretation:  Atrial flutter Left bundle branch block Baseline wander in lead(s) V6 rate had decreased since last tracing earlier today Confirmed by Pattricia Boss  [phone]) on 04/25/2018 10:05:41 AM   Radiology Dg Chest Port 1 View  Result Date: 04/25/2018 CLINICAL DATA:  Generalized weakness for 1 week. EXAM: PORTABLE CHEST 1 VIEW COMPARISON:  12/23/2017 FINDINGS: Enlarged cardiac silhouette.  Mediastinal contours appear intact. There is no evidence of pneumothorax. Moderate in size left pleural effusion. Left lower lobe airspace consolidation versus atelectasis. Chronic biapical scarring. Osseous structures are without acute abnormality. Soft tissues are grossly normal. IMPRESSION: Moderate in size left pleural effusion. Left lower lobe airspace consolidation versus atelectasis. Enlarged cardiac silhouette. Electronically Signed   By: Fidela Salisbury M.D.   On: 04/25/2018 09:21    Procedures .Critical Care Performed by: Kinnie Feil, PA-C Authorized by: Kinnie Feil, PA-C   Critical care provider statement:    Critical care time (minutes):  45   Critical care was necessary to treat or prevent imminent or life-threatening deterioration of the following conditions: atrial fibrillation with RVR requiring IV med, admission.   Critical care was time spent personally by me on the following activities:  Discussions with consultants, evaluation of patient's response to treatment, examination of patient, ordering and performing treatments and interventions, ordering and review of laboratory studies, ordering and review of radiographic studies, pulse oximetry, re-evaluation of patient's condition, obtaining history from patient or surrogate, review of old charts and development of treatment plan with patient or surrogate   I assumed direction of critical care for this patient from another provider in my specialty: no     (including critical care time)  Medications Ordered in ED Medications  0.9 %  sodium chloride infusion ( Intravenous New Bag/Given 04/25/18 0853)  diltiazem (CARDIZEM) 1 mg/mL load via infusion 10 mg (10 mg Intravenous Bolus  from Bag 04/25/18 0926)    And  diltiazem (CARDIZEM) 100 mg in dextrose 5% 122mL (1 mg/mL) infusion (5 mg/hr Intravenous Restarted 04/25/18 0927)  metoprolol tartrate (LOPRESSOR) tablet 25 mg (25 mg Oral Given 04/25/18 0850)     Initial Impression / Assessment and Plan / ED Course  I have reviewed the triage vital signs and the nursing notes.  Pertinent labs & imaging results that were available during my care of the patient were reviewed by me and considered in my medical decision making (see chart for details).  Clinical Course as of Apr 26 1011  Sat Apr 25, 2018  0920 Hemoglobin(!): 8.9 [CG]  0930 IMPRESSION: Moderate in size left pleural effusion.  Left lower lobe airspace consolidation versus atelectasis.  Enlarged cardiac silhouette.  DG Chest Port 1 View [CG]  [id number] INR(!!): 6.21 [CG]    Clinical Course User Index [CG] Kinnie Feil, PA-C   84 year old with history of persistent atrial fibrillation on metoprolol and warfarin is here with a-fib with RVR.  HR 150-180s per EMS.  Sudden onset chest pressure, shortness of breath, palpitations at 0600 today.   She received aspirin and nitroglycerin on route and her chest discomfort has slightly improved.  She has been compliant with all her medications.  No dietary indiscretions.  She has no infectious symptoms.  HR 120s in ER.   We will initiate lab work, chest x-ray.  She is hemodynamically stable.  No reported missed doses of warfarin.  We will initiate Cardizem drip, will give her dose of home metoprolol.  She does not look overtly fluid overloaded however we will obtain BNP as well.  She has no infectious symptoms, cough and I doubt pneumonia. No melena reported.   9604: Heart rate improving less than 100.  Chest x-ray with left moderate pleural effusion.  Left consolidation versus atelectasis, she has no cough, fevers or chills, white count.  We will defer antibiotics.  Hemoglobin 8.9, negative Hemoccult.  INR 6.21.  We  will consult hospitalist for admission for atrial fibrillation with RVR, improving, acute anemia, elevated INR, left pleural effusion.  She has received diltiazem bolus and drip, metoprolol, and as infusion in the ER.  Pending UA, BNP.  Pt and family updated on pending admission.   Final Clinical Impressions(s) / ED Diagnoses   Final diagnoses:  Atrial fibrillation with rapid ventricular response (HCC)  Acute anemia  Elevated INR  Dyspnea on exertion  Pleural effusion, left    ED Discharge Orders    None       Kinnie Feil, PA-C 04/25/18 1013    Pattricia Boss, MD 04/26/18 [phone]

## 2018-04-25 NOTE — ED Notes (Signed)
PureWick placed on pt with intermittent suction. 

## 2018-04-26 ENCOUNTER — Observation Stay (HOSPITAL_COMMUNITY): Payer: Medicare Other

## 2018-04-26 DIAGNOSIS — R0602 Shortness of breath: Secondary | ICD-10-CM | POA: Diagnosis present

## 2018-04-26 DIAGNOSIS — Z9861 Coronary angioplasty status: Secondary | ICD-10-CM | POA: Diagnosis not present

## 2018-04-26 DIAGNOSIS — D638 Anemia in other chronic diseases classified elsewhere: Secondary | ICD-10-CM | POA: Diagnosis present

## 2018-04-26 DIAGNOSIS — J189 Pneumonia, unspecified organism: Secondary | ICD-10-CM | POA: Diagnosis present

## 2018-04-26 DIAGNOSIS — Z66 Do not resuscitate: Secondary | ICD-10-CM | POA: Diagnosis present

## 2018-04-26 DIAGNOSIS — D649 Anemia, unspecified: Secondary | ICD-10-CM

## 2018-04-26 DIAGNOSIS — R791 Abnormal coagulation profile: Secondary | ICD-10-CM | POA: Diagnosis not present

## 2018-04-26 DIAGNOSIS — Z7901 Long term (current) use of anticoagulants: Secondary | ICD-10-CM | POA: Diagnosis not present

## 2018-04-26 DIAGNOSIS — I4891 Unspecified atrial fibrillation: Secondary | ICD-10-CM | POA: Diagnosis not present

## 2018-04-26 DIAGNOSIS — Z79899 Other long term (current) drug therapy: Secondary | ICD-10-CM | POA: Diagnosis not present

## 2018-04-26 DIAGNOSIS — Z882 Allergy status to sulfonamides status: Secondary | ICD-10-CM | POA: Diagnosis not present

## 2018-04-26 DIAGNOSIS — N183 Chronic kidney disease, stage 3 (moderate): Secondary | ICD-10-CM

## 2018-04-26 DIAGNOSIS — R0609 Other forms of dyspnea: Secondary | ICD-10-CM | POA: Diagnosis not present

## 2018-04-26 DIAGNOSIS — C8440 Peripheral T-cell lymphoma, not classified, unspecified site: Secondary | ICD-10-CM | POA: Diagnosis not present

## 2018-04-26 DIAGNOSIS — E7800 Pure hypercholesterolemia, unspecified: Secondary | ICD-10-CM

## 2018-04-26 DIAGNOSIS — Z823 Family history of stroke: Secondary | ICD-10-CM | POA: Diagnosis not present

## 2018-04-26 DIAGNOSIS — I5032 Chronic diastolic (congestive) heart failure: Secondary | ICD-10-CM | POA: Diagnosis present

## 2018-04-26 DIAGNOSIS — Z888 Allergy status to other drugs, medicaments and biological substances status: Secondary | ICD-10-CM | POA: Diagnosis not present

## 2018-04-26 DIAGNOSIS — E039 Hypothyroidism, unspecified: Secondary | ICD-10-CM | POA: Diagnosis present

## 2018-04-26 DIAGNOSIS — Z8249 Family history of ischemic heart disease and other diseases of the circulatory system: Secondary | ICD-10-CM | POA: Diagnosis not present

## 2018-04-26 DIAGNOSIS — C8590 Non-Hodgkin lymphoma, unspecified, unspecified site: Secondary | ICD-10-CM | POA: Diagnosis present

## 2018-04-26 DIAGNOSIS — E785 Hyperlipidemia, unspecified: Secondary | ICD-10-CM | POA: Diagnosis present

## 2018-04-26 DIAGNOSIS — I2510 Atherosclerotic heart disease of native coronary artery without angina pectoris: Secondary | ICD-10-CM | POA: Diagnosis present

## 2018-04-26 DIAGNOSIS — R0902 Hypoxemia: Secondary | ICD-10-CM | POA: Diagnosis present

## 2018-04-26 DIAGNOSIS — J90 Pleural effusion, not elsewhere classified: Secondary | ICD-10-CM

## 2018-04-26 DIAGNOSIS — I130 Hypertensive heart and chronic kidney disease with heart failure and stage 1 through stage 4 chronic kidney disease, or unspecified chronic kidney disease: Secondary | ICD-10-CM | POA: Diagnosis present

## 2018-04-26 DIAGNOSIS — I4819 Other persistent atrial fibrillation: Secondary | ICD-10-CM | POA: Diagnosis present

## 2018-04-26 DIAGNOSIS — Z79890 Hormone replacement therapy (postmenopausal): Secondary | ICD-10-CM | POA: Diagnosis not present

## 2018-04-26 DIAGNOSIS — R54 Age-related physical debility: Secondary | ICD-10-CM | POA: Diagnosis present

## 2018-04-26 LAB — BASIC METABOLIC PANEL
Anion gap: 9 (ref 5–15)
BUN: 21 mg/dL (ref 8–23)
CO2: 23 mmol/L (ref 22–32)
Calcium: 8.5 mg/dL — ABNORMAL LOW (ref 8.9–10.3)
Chloride: 106 mmol/L (ref 98–111)
Creatinine, Ser: 1.11 mg/dL — ABNORMAL HIGH (ref 0.44–1.00)
GFR calc Af Amer: 50 mL/min — ABNORMAL LOW (ref >60)
GFR calc non Af Amer: 43 mL/min — ABNORMAL LOW (ref >60)
Glucose, Bld: 96 mg/dL (ref 70–99)
Potassium: 4.5 mmol/L (ref 3.5–5.1)
Sodium: 138 mmol/L (ref 135–145)

## 2018-04-26 LAB — TROPONIN I: Troponin I: <0.03 ng/mL (ref <0.03)

## 2018-04-26 LAB — CBC
HCT: 29.7 % — ABNORMAL LOW (ref 36.0–46.0)
Hemoglobin: 8.8 g/dL — ABNORMAL LOW (ref 12.0–15.0)
MCH: 27.3 pg (ref 26.0–34.0)
MCHC: 29.6 g/dL — AB (ref 30.0–36.0)
MCV: 92.2 fL (ref 80.0–100.0)
Platelets: 344 10*3/uL (ref 150–400)
RBC: 3.22 MIL/uL — ABNORMAL LOW (ref 3.87–5.11)
RDW: 14.8 % (ref 11.5–15.5)
WBC: 9.1 10*3/uL (ref 4.0–10.5)
nRBC: 0 % (ref 0.0–0.2)

## 2018-04-26 LAB — PROTIME-INR
INR: 6.87
Prothrombin Time: 58.3 seconds — ABNORMAL HIGH (ref 11.4–15.2)

## 2018-04-26 MED ORDER — DILTIAZEM HCL 60 MG PO TABS
60.0000 mg | ORAL_TABLET | Freq: Three times a day (TID) | ORAL | Status: DC
  Administered 2018-04-26 – 2018-04-27 (×4): 60 mg via ORAL
  Filled 2018-04-26 (×4): qty 1

## 2018-04-26 MED ORDER — PHYTONADIONE 5 MG PO TABS
5.0000 mg | ORAL_TABLET | Freq: Once | ORAL | Status: AC
  Administered 2018-04-26: 5 mg via ORAL
  Filled 2018-04-26: qty 1

## 2018-04-26 NOTE — Progress Notes (Signed)
PROGRESS NOTE  [name]  GGY:[id number] DOB: [birth year] DOA: 04/25/2018 PCP: [name], MD   Brief Narrative: [name] is a 84 y.o. female with medical history significant of afib on Coumadin; hypothyroidism; HTN; HLD; CAD s/p PCI; and chronic diastolic CHF presenting with weakness.  "Well, my heart was a running with me and I knew it."  She was SOB.  Her chest was uncomfortable without clear pain.    She has not had this problem  In a long time.  She has been shocked in the past.  She feels better now and is not SOB at rest.  She has been feeling bad for about a week, had a dripping nose not improved with Claritin.  She might have over-eaten a bit.     ED Course:  Afib with RVR.  She has had weakness for a week. This AM with CP and SOB.  Worsening symptoms with exertion.  Compliant with medications.  Hgb 8.9, heme negative.  CXR with moderate L pleural effusion.  Given metoprolol and started on Cardizem drip with improvement in HR and symptoms.  INR is 6.21.  Hospital Course: Heart rate control continued with transition to po diltiazem. She continues to be hypoxic and symptomatic from pleural effusion, so thoracentesis is planned. Vitamin K administered.  Assessment & Plan: Principal Problem:   Atrial fibrillation with RVR (HCC) Active Problems:   Hyperlipidemia   Pleural effusion   Angioimmunoblastic lymphoma (HCC)   CKD (chronic kidney disease) stage 3, GFR 30-59 ml/min (HCC)   Anemia   Supratherapeutic INR  Afib with RVR: Uncertain precipitant ?URI with rhinorrhea.  - Continue diltiazem po, DC gtt and continue telemetry monitoring.  - Troponins negative - Holding coumadin with supratherapeutic INR. If below 2 would start heparin.   Supratherapeutic INR: Unclear precipitant, denies change in meds or diet.  - Monitor daily.  - Give vit K x1 due to need for procedure.   Anemia of chronic disease: Below baseline but stable with more stable vital signs.  -  Check FOBT - Monitor CBC  Pleural effusion on left: Symptomatic. Has hx of this in the past, though enlarged now. Despite improved rate control continues to enlarge based on my interpretation of CXR this AM. ?worsened with AFib. - Thoracentesis, send protein, cell count, gram stain and culture, and cytology given her h/o lymphoma.   Lymphoma: Last seen by Dr. Lindi Adie in 8/19 - Given her advanced age, chemotherapy was not recommended. Surveillance was the treatment selected  Stage 3 CKD: Stable. - Monitor BMP.  HLD - Continue Lipitor and Zetia   DVT prophylaxis: Supratherapeutic INR Code Status: DNR Family Communication: Husband and daughter in law at bedside Disposition Plan: Uncertain.   Consultants:   IR  Procedures:   Thoracentesis requested  Antimicrobials:  None   Subjective: Breathing better than at admission but feels very weak. Got very short of breath with short walking. Her 22yo husband isn't able to help her much at home. Hypoxic with ambulation.   Objective: Vitals:   04/26/18 0007 04/26/18 0517 04/26/18 0813 04/26/18 1254  BP: 108/66 108/75 117/73 110/63  Pulse: 79 87  60  Resp: (!) 30 (!) 26    Temp:  98.4 F (36.9 C) 97.9 F (36.6 C) 98.1 F (36.7 C)  TempSrc:  Oral Oral Oral  SpO2: 95% 96% 94% 93%  Weight:  61.6 kg    Height:        Intake/Output Summary (Last 24 hours) at  04/26/2018 1558 Last data filed at 04/26/2018 1534 Gross per 24 hour  Intake 674.4 ml  Output 1000 ml  Net -325.6 ml   Filed Weights   04/25/18 0833 04/26/18 0517  Weight: 59.4 kg 61.6 kg    Gen: Pleasant elderly female in no distress, short sentences while getting up to wheelchair due to dyspnea. Pulm: Non-labored breathing at rest, very decreased on left, no crackles. CV: Irreg irreg. Soft systolic murmur without rub, or gallop. No JVD, trace pedal edema. GI: Abdomen soft, non-tender, non-distended, with normoactive bowel sounds. No organomegaly or masses  felt. Ext: Warm, no deformities Skin: No rashes, lesions no ulcers Neuro: Alert and oriented. No focal neurological deficits. Psych: Judgement and insight appear normal. Mood & affect appropriate.   Data Reviewed: I have personally reviewed following labs and imaging studies  CBC: Recent Labs  Lab 04/25/18 0834 04/26/18 0829  WBC 9.8 9.1  HGB 8.9* 8.8*  HCT 30.1* 29.7*  MCV 94.1 92.2  PLT 355 323   Basic Metabolic Panel: Recent Labs  Lab 04/25/18 0834 04/26/18 0829  NA 138 138  K 4.5 4.5  CL 105 106  CO2 22 23  GLUCOSE 109* 96  BUN 28* 21  CREATININE 1.06* 1.11*  CALCIUM 8.8* 8.5*  MG 2.3  --    GFR: Estimated Creatinine Clearance: 26.8 mL/min (A) (by C-G formula based on SCr of 1.11 mg/dL (H)). Liver Function Tests: No results for input(s): AST, ALT, ALKPHOS, BILITOT, PROT, ALBUMIN in the last 168 hours. No results for input(s): LIPASE, AMYLASE in the last 168 hours. No results for input(s): AMMONIA in the last 168 hours. Coagulation Profile: Recent Labs  Lab 04/25/18 0834 04/26/18 0421  INR 6.21* 6.87*   Cardiac Enzymes: Recent Labs  Lab 04/25/18 1532 04/25/18 2057 04/26/18 0037  TROPONINI <0.03 <0.03 <0.03   BNP (last 3 results) No results for input(s): PROBNP in the last 8760 hours. HbA1C: No results for input(s): HGBA1C in the last 72 hours. CBG: No results for input(s): GLUCAP in the last 168 hours. Lipid Profile: No results for input(s): CHOL, HDL, LDLCALC, TRIG, CHOLHDL, LDLDIRECT in the last 72 hours. Thyroid Function Tests: Recent Labs    04/25/18 0834  TSH 0.983  FREET4 1.08   Anemia Panel: No results for input(s): VITAMINB12, FOLATE, FERRITIN, TIBC, IRON, RETICCTPCT in the last 72 hours. Urine analysis:    Component Value Date/Time   COLORURINE YELLOW 04/25/2018 2100   APPEARANCEUR CLEAR 04/25/2018 2100   LABSPEC 1.019 04/25/2018 2100   PHURINE 5.0 04/25/2018 2100   GLUCOSEU NEGATIVE 04/25/2018 2100   HGBUR SMALL (A)  04/25/2018 2100   [place] NEGATIVE 04/25/2018 2100   [place] NEGATIVE 04/25/2018 2100   PROTEINUR NEGATIVE 04/25/2018 2100   UROBILINOGEN 1.0 11/01/2012 1849   NITRITE NEGATIVE 04/25/2018 2100   LEUKOCYTESUR NEGATIVE 04/25/2018 2100   Recent Results (from the past 240 hour(s))  MRSA PCR Screening     Status: None   Collection Time: 04/25/18 11:46 AM  Result Value Ref Range Status   MRSA by PCR NEGATIVE NEGATIVE Final    Comment:        The GeneXpert MRSA Assay (FDA approved for NASAL specimens only), is one component of a comprehensive MRSA colonization surveillance program. It is not intended to diagnose MRSA infection nor to guide or monitor treatment for MRSA infections. Performed at [facility], [place] [address], [place], [place] [address]       Radiology Studies: Dg Chest 2 View  Result Date: 04/26/2018 CLINICAL DATA:  Pleural effusion. Weakness. EXAM: CHEST - 2 VIEW COMPARISON:  04/25/2018 FINDINGS: The cardiac silhouette remains enlarged. A moderate-sized left pleural effusion has mildly enlarged from the prior study. There is persistent left basilar atelectasis or consolidation. Minimal atelectasis is noted in the right lung base. There is biapical pleural thickening. No pneumothorax is identified. A left shoulder arthroplasty is noted. IMPRESSION: Mildly enlargement of moderate-sized left pleural effusion with left basilar atelectasis or consolidation. Electronically Signed   By: Logan Bores M.D.   On: 04/26/2018 11:56   Dg Chest Port 1 View  Result Date: 04/25/2018 CLINICAL DATA:  Generalized weakness for 1 week. EXAM: PORTABLE CHEST 1 VIEW COMPARISON:  12/23/2017 FINDINGS: Enlarged cardiac silhouette.  Mediastinal contours appear intact. There is no evidence of pneumothorax. Moderate in size left pleural effusion. Left lower lobe airspace consolidation versus atelectasis. Chronic biapical scarring. Osseous structures are without acute abnormality. Soft  tissues are grossly normal. IMPRESSION: Moderate in size left pleural effusion. Left lower lobe airspace consolidation versus atelectasis. Enlarged cardiac silhouette. Electronically Signed   By: Fidela Salisbury M.D.   On: 04/25/2018 09:21    Scheduled Meds: . atorvastatin  40 mg Oral q1800  . diltiazem  60 mg Oral Q8H  . ezetimibe  10 mg Oral Daily  . folic acid  2 mg Oral Daily  . ipratropium  2 spray Each Nare Daily  . levothyroxine  88 mcg Oral QAC breakfast  . loratadine  10 mg Oral Daily  . metoprolol tartrate  25 mg Oral BID  . multivitamin with minerals  1 tablet Oral Daily  . vitamin C  500 mg Oral Daily  . Warfarin - Pharmacist Dosing Inpatient   Does not apply q1800   Continuous Infusions:   LOS: 0 days   Time spent: 25 minutes.  Patrecia Pour, MD [facility] [email or web address] Password [facility] 04/26/2018, 3:58 PM

## 2018-04-26 NOTE — Progress Notes (Signed)
Ambulated patient approximately 50-60 feet. She stated she felt short of breath on exertion. Sats ranged anywhere from 87-95% on RA. Patient sat up in the chair when she got done walking and sats quickly back up to 95 on Room Air. Dr. Bonner Puna notified

## 2018-04-26 NOTE — Progress Notes (Signed)
ANTICOAGULATION CONSULT NOTE   Pharmacy Consult:  Coumadin Indication: atrial fibrillation  Allergies  Allergen Reactions  . [name] [Ethacrynic Acid] Nausea And Vomiting  . Furosemide Rash  . Sulfa Antibiotics Itching and Rash  . Tape Rash and Other (See Comments)    Tears skin off.  Please use "paper" tape only.   . [name] Rash    Per Dr Sallyanne Kuster, had a skin reaction with thiazides prior to use of Lasix  . Torsemide Rash    Patient Measurements: Height: 5\' 3"  (160 cm) Weight: 135 lb 14.4 oz (61.6 kg) IBW/kg (Calculated) : 52.4  Vital Signs: Temp: 98.1 F (36.7 C) (12/01 1254) Temp Source: Oral (12/01 1254) BP: 110/63 (12/01 1254) Pulse Rate: 60 (12/01 1254)  Labs: Recent Labs    04/25/18 0834 04/25/18 1532 04/25/18 2057 04/26/18 0037 04/26/18 0421 04/26/18 0829  HGB 8.9*  --   --   --   --  8.8*  HCT 30.1*  --   --   --   --  29.7*  PLT 355  --   --   --   --  344  LABPROT 54.0*  --   --   --  58.3*  --   INR 6.21*  --   --   --  6.87*  --   CREATININE 1.06*  --   --   --   --  1.11*  TROPONINI  --  <0.03 <0.03 <0.03  --   --     Estimated Creatinine Clearance: 26.8 mL/min (A) (by C-G formula based on SCr of 1.11 mg/dL (H)).   Medical History: Past Medical History:  Diagnosis Date  .  Acute respiratiory failure requiring BiPap 06/24/2011  . Allergy   . Aortic valvular stenosis, moderate to severe 06/24/2011   a. 10/2012 Echo: mod AS, Valve 1.25 cm^2 (VTI), 1.19cm^2 (Vmax).  . Arthritis    "back" (10/30/2012)  . Chronic diastolic CHF (congestive heart failure) ([place])    a. 10/2012 Echo: EF 55-60%, no rwma, Gr 2 DD, moderate AS, mod dil LA, mildly to mod dil RA.  Marland Kitchen Coronary artery disease    a. 03/2009 PCI RCA (3.0x18 BMS).  . Dyslipidemia   . Exertional shortness of breath   . Hypertension   . Hypothyroidism   . PAF (paroxysmal atrial fibrillation) ([place])    a. CHA2DS2VASc = 7-->chronic coumadin;  b. s/p DCCV 05/2011, 05/2012, 07/2014;  c.  On amio.  . Rash    a. felt to be 2/2 lasix/torsemide in setting of sulfa allergy (lasix d/c ~ 06/2014, torsemide d/c 08/09/2014).      Assessment: 51 YOF presented with weakness, SOB and palpitations.  Patient is in Afib with RVR (CHADSVASC ~ 6) and started on diltiazem infusion.  Pharmacy consulted to manage Coumadin from PTA.  INR is supra-therapeutic on admit; FOB negative and Hf stable -INR continues to rise and vitamin K 5mg  po ordered  Home Coumadin dose: 2.5mg  daily except 1.25 on Fri, last dose 11/28  Goal of Therapy:  INR 2-3 Monitor platelets by anticoagulation protocol: Yes   Plan:  Hold Coumadin today Daily PT / INR  Hildred Laser, PharmD Clinical Pharmacist **Pharmacist phone directory can now be found on [email or web address] ([email or web address]).  Listed under [place].

## 2018-04-27 ENCOUNTER — Other Ambulatory Visit (HOSPITAL_COMMUNITY): Payer: Medicare Other

## 2018-04-27 LAB — BASIC METABOLIC PANEL
Anion gap: 6 (ref 5–15)
BUN: 23 mg/dL (ref 8–23)
CO2: 27 mmol/L (ref 22–32)
CREATININE: 1.11 mg/dL — AB (ref 0.44–1.00)
Calcium: 8.5 mg/dL — ABNORMAL LOW (ref 8.9–10.3)
Chloride: 104 mmol/L (ref 98–111)
GFR calc Af Amer: 50 mL/min — ABNORMAL LOW (ref >60)
GFR calc non Af Amer: 43 mL/min — ABNORMAL LOW (ref >60)
Glucose, Bld: 143 mg/dL — ABNORMAL HIGH (ref 70–99)
Potassium: 4.5 mmol/L (ref 3.5–5.1)
Sodium: 137 mmol/L (ref 135–145)

## 2018-04-27 LAB — CBC
HCT: 30.7 % — ABNORMAL LOW (ref 36.0–46.0)
Hemoglobin: 9.2 g/dL — ABNORMAL LOW (ref 12.0–15.0)
MCH: 27.1 pg (ref 26.0–34.0)
MCHC: 30 g/dL (ref 30.0–36.0)
MCV: 90.3 fL (ref 80.0–100.0)
Platelets: 416 10*3/uL — ABNORMAL HIGH (ref 150–400)
RBC: 3.4 MIL/uL — ABNORMAL LOW (ref 3.87–5.11)
RDW: 15 % (ref 11.5–15.5)
WBC: 12.4 10*3/uL — ABNORMAL HIGH (ref 4.0–10.5)
nRBC: 0 % (ref 0.0–0.2)

## 2018-04-27 LAB — URINE CULTURE: Culture: NO GROWTH

## 2018-04-27 LAB — PROTIME-INR
INR: 3.8
INR: 3.08
Prothrombin Time: 36.8 seconds — ABNORMAL HIGH (ref 11.4–15.2)
Prothrombin Time: 31.3 seconds — ABNORMAL HIGH (ref 11.4–15.2)

## 2018-04-27 MED ORDER — ALPRAZOLAM 0.25 MG PO TABS
0.2500 mg | ORAL_TABLET | Freq: Once | ORAL | Status: AC
  Administered 2018-04-27: 0.25 mg via ORAL
  Filled 2018-04-27: qty 1

## 2018-04-27 MED ORDER — DIGOXIN 0.25 MG/ML IJ SOLN
0.5000 mg | Freq: Once | INTRAMUSCULAR | Status: AC
  Administered 2018-04-27: 0.5 mg via INTRAVENOUS
  Filled 2018-04-27: qty 2

## 2018-04-27 MED ORDER — PHYTONADIONE 5 MG PO TABS
2.5000 mg | ORAL_TABLET | Freq: Once | ORAL | Status: AC
  Administered 2018-04-27: 2.5 mg via ORAL
  Filled 2018-04-27: qty 1

## 2018-04-27 MED ORDER — DILTIAZEM HCL 60 MG PO TABS
30.0000 mg | ORAL_TABLET | Freq: Three times a day (TID) | ORAL | Status: DC
  Administered 2018-04-27 – 2018-04-28 (×3): 30 mg via ORAL
  Filled 2018-04-27 (×3): qty 1

## 2018-04-27 NOTE — Progress Notes (Signed)
IR aware of request for thoracentesis - patient previously on Warfarin with INR 6.87 yesterday. Repeat INR 3.80 today.   Will hold thoracentesis until INR <2.5 given high risk of bleeding complications with procedure. If patient is in respiratory distress thoracentesis can be discussed however the risks of this procedure would be greatly increased.  IR will continue to follow INR which is being managed by primary team - will proceed when INR <2.5.  Please call with questions or concerns.  Candiss Norse, PA-C

## 2018-04-27 NOTE — Progress Notes (Signed)
PO cardizem changed from 60mg  Q8hr to 30mg  Q8hr d/t soft BP earlier today(per report). HR now sustaining 120's and up to 140's non sustained. Pt asymptomatic. BP 106/71 MAP 82. Provider on call paged via amion. Will continue to monitor. Jessie Foot, RN

## 2018-04-27 NOTE — Progress Notes (Addendum)
PROGRESS NOTE  [name]  CBJ:[id number] DOB: 05-14-1924 DOA: 04/25/2018 PCP: [name], MD   Brief Narrative: [name] is a 84 y.o. female with medical history significant of afib on coumadin; hypothyroidism; HTN; HLD; CAD s/p PCI; and chronic diastolic CHF presented with weakness and palpitations associated with dyspnea on 11/30. Found to be in AFib w/RVR, started on diltiazem gtt. INR 6.21 so coumadin held. CXR demonstrated increasing left pleural effusion. Heart rate control continued with transition to po diltiazem. She continues to be hypoxic and symptomatic from pleural effusion, so thoracentesis is planned. Vitamin K administered. Awaiting INR <2.5 per IR.  Assessment & Plan: Principal Problem:   Atrial fibrillation with RVR (HCC) Active Problems:   Hyperlipidemia   Pleural effusion   Angioimmunoblastic lymphoma (HCC)   CKD (chronic kidney disease) stage 3, GFR 30-59 ml/min (HCC)   Anemia   Supratherapeutic INR  Afib with RVR: Uncertain precipitant ?URI with rhinorrhea.  - Continue diltiazem po, metoprolol. Getting slow ventricular response, will decrease diltiazem dose continue home metop. - Troponins negative - Holding coumadin with supratherapeutic INR. If below 2 would start heparin.  - Check echocardiogram with none recent and unclear cause of RVR.  Supratherapeutic INR: Unclear precipitant, denies change in meds or diet.  - Monitor daily.  - Gave vit K x1 due to need for procedure. Will recheck INR this PM and readminister if not continuing decline.  Anemia of chronic disease: Below baseline but stable with more stable vital signs.  - Check FOBT - Monitor CBC  Pleural effusion on left: Symptomatic. Has hx of this in the past, though enlarged now. Despite improved rate control continues to enlarge based on my interpretation of CXR this AM. ?worsened with AFib. - Thoracentesis when INR <2.5 per IR, send protein, cell count, gram stain and culture,  and cytology given her h/o lymphoma.   Lymphoma: Last seen by Dr. Lindi Adie in 8/19 - Given her advanced age, chemotherapy was not recommended. Surveillance was the treatment selected  Stage 3 CKD: Stable. - Monitor BMP.  HLD - Continue Lipitor and Zetia   Leukocytosis: Unclear etiology. Afebrile,  - Continue monitoring off abx. Culture if fevers.   DVT prophylaxis: Supratherapeutic INR Code Status: DNR Family Communication: None at bedside this AM. Disposition Plan: Anticipate home once hypoxia on exertion resolved. Will need thoracentesis.    Consultants:   IR  Procedures:   Thoracentesis requested  Antimicrobials:  None   Subjective: Remains short of breath with exertion, and seems short of breath toward the end of sentences. No chest pain or palpitations.    Objective: Vitals:   04/26/18 2338 04/27/18 0304 04/27/18 0835 04/27/18 1159  BP: (!) 149/84 136/71 120/61 93/74  Pulse: (!) 106 80 89 90  Resp: (!) 32 (!) 32 (!) 30 (!) 27  Temp: 98.1 F (36.7 C) 98.1 F (36.7 C) (!) 97.5 F (36.4 C) 98.9 F (37.2 C)  TempSrc: Oral Oral Oral Oral  SpO2: 93% 96% 97% 94%  Weight:  61.1 kg    Height:        Intake/Output Summary (Last 24 hours) at 04/27/2018 1432 Last data filed at 04/27/2018 1421 Gross per 24 hour  Intake 180 ml  Output 900 ml  Net -720 ml   Filed Weights   04/25/18 0833 04/26/18 0517 04/27/18 0304  Weight: 59.4 kg 61.6 kg 61.1 kg   Gen: Elderly female in no distress Pulm: Nonlabored, decreased on left. CV: Irreg irreg. Soft systolic murmur,  no rub, or gallop. No JVD, no dependent edema. GI: Abdomen soft, non-tender, non-distended, with normoactive bowel sounds.  Ext: Warm, no deformities Skin: No rashes, lesions or ulcers on visualized skin. Marland Kitchen  Neuro: Alert and oriented. No focal neurological deficits. Psych: Judgement and insight appear fair. Mood euthymic & affect congruent. Behavior is appropriate.    Data Reviewed: I have personally  reviewed following labs and imaging studies  CBC: Recent Labs  Lab 04/25/18 0834 04/26/18 0829 04/27/18 0401  WBC 9.8 9.1 12.4*  HGB 8.9* 8.8* 9.2*  HCT 30.1* 29.7* 30.7*  MCV 94.1 92.2 90.3  PLT 355 344 465*   Basic Metabolic Panel: Recent Labs  Lab 04/25/18 0834 04/26/18 0829 04/27/18 0401  NA 138 138 137  K 4.5 4.5 4.5  CL 105 106 104  CO2 22 23 27   GLUCOSE 109* 96 143*  BUN 28* 21 23  CREATININE 1.06* 1.11* 1.11*  CALCIUM 8.8* 8.5* 8.5*  MG 2.3  --   --    GFR: Estimated Creatinine Clearance: 26.8 mL/min (A) (by C-G formula based on SCr of 1.11 mg/dL (H)). Liver Function Tests: No results for input(s): AST, ALT, ALKPHOS, BILITOT, PROT, ALBUMIN in the last 168 hours. No results for input(s): LIPASE, AMYLASE in the last 168 hours. No results for input(s): AMMONIA in the last 168 hours. Coagulation Profile: Recent Labs  Lab 04/25/18 0834 04/26/18 0421 04/27/18 0401  INR 6.21* 6.87* 3.80   Cardiac Enzymes: Recent Labs  Lab 04/25/18 1532 04/25/18 2057 04/26/18 0037  TROPONINI <0.03 <0.03 <0.03   BNP (last 3 results) No results for input(s): PROBNP in the last 8760 hours. HbA1C: No results for input(s): HGBA1C in the last 72 hours. CBG: No results for input(s): GLUCAP in the last 168 hours. Lipid Profile: No results for input(s): CHOL, HDL, LDLCALC, TRIG, CHOLHDL, LDLDIRECT in the last 72 hours. Thyroid Function Tests: Recent Labs    04/25/18 0834  TSH 0.983  FREET4 1.08   Anemia Panel: No results for input(s): VITAMINB12, FOLATE, FERRITIN, TIBC, IRON, RETICCTPCT in the last 72 hours. Urine analysis:    Component Value Date/Time   COLORURINE YELLOW 04/25/2018 2100   APPEARANCEUR CLEAR 04/25/2018 2100   LABSPEC 1.019 04/25/2018 2100   PHURINE 5.0 04/25/2018 2100   GLUCOSEU NEGATIVE 04/25/2018 2100   HGBUR SMALL (A) 04/25/2018 2100   [place] NEGATIVE 04/25/2018 2100   [place] NEGATIVE 04/25/2018 2100   PROTEINUR NEGATIVE 04/25/2018  2100   UROBILINOGEN 1.0 11/01/2012 1849   NITRITE NEGATIVE 04/25/2018 2100   LEUKOCYTESUR NEGATIVE 04/25/2018 2100   Recent Results (from the past 240 hour(s))  MRSA PCR Screening     Status: None   Collection Time: 04/25/18 11:46 AM  Result Value Ref Range Status   MRSA by PCR NEGATIVE NEGATIVE Final    Comment:        The GeneXpert MRSA Assay (FDA approved for NASAL specimens only), is one component of a comprehensive MRSA colonization surveillance program. It is not intended to diagnose MRSA infection nor to guide or monitor treatment for MRSA infections. Performed at [facility], [place] [address], [place],  [address]   Urine culture     Status: None   Collection Time: 04/26/18  5:50 AM  Result Value Ref Range Status   Specimen Description URINE, RANDOM  Final   Special Requests NONE  Final   Culture   Final    NO GROWTH Performed at [facility], [place],  [place] [address]    Report Status 04/27/2018 FINAL  Final      Radiology Studies: Dg Chest 2 View  Result Date: 04/26/2018 CLINICAL DATA:  Pleural effusion. Weakness. EXAM: CHEST - 2 VIEW COMPARISON:  04/25/2018 FINDINGS: The cardiac silhouette remains enlarged. A moderate-sized left pleural effusion has mildly enlarged from the prior study. There is persistent left basilar atelectasis or consolidation. Minimal atelectasis is noted in the right lung base. There is biapical pleural thickening. No pneumothorax is identified. A left shoulder arthroplasty is noted. IMPRESSION: Mildly enlargement of moderate-sized left pleural effusion with left basilar atelectasis or consolidation. Electronically Signed   By: Logan Bores M.D.   On: 04/26/2018 11:56    Scheduled Meds: . atorvastatin  40 mg Oral q1800  . diltiazem  60 mg Oral Q8H  . ezetimibe  10 mg Oral Daily  . folic acid  2 mg Oral Daily  . ipratropium  2 spray Each Nare Daily  . levothyroxine  88 mcg Oral QAC breakfast  .  loratadine  10 mg Oral Daily  . metoprolol tartrate  25 mg Oral BID  . multivitamin with minerals  1 tablet Oral Daily  . vitamin C  500 mg Oral Daily  . Warfarin - Pharmacist Dosing Inpatient   Does not apply q1800   Continuous Infusions:   LOS: 1 day   Time spent: 25 minutes.  Patrecia Pour, MD [facility] [email or web address] Password [facility] 04/27/2018, 2:32 PM

## 2018-04-27 NOTE — Progress Notes (Signed)
ANTICOAGULATION CONSULT NOTE   Pharmacy Consult: Coumadin Indication: atrial fibrillation  Allergies  Allergen Reactions  . [name] [Ethacrynic Acid] Nausea And Vomiting  . Furosemide Rash  . Sulfa Antibiotics Itching and Rash  . Tape Rash and Other (See Comments)    Tears skin off.  Please use "paper" tape only.   . [name] Rash    Per Dr Sallyanne Kuster, had a skin reaction with thiazides prior to use of Lasix  . Torsemide Rash    Patient Measurements: Height: 5\' 3"  (160 cm) Weight: 134 lb 9.6 oz (61.1 kg) IBW/kg (Calculated) : 52.4  Vital Signs: Temp: 97.5 F (36.4 C) (12/02 0835) Temp Source: Oral (12/02 0835) BP: 120/61 (12/02 0835) Pulse Rate: 89 (12/02 0835)  Labs: Recent Labs    04/25/18 0834 04/25/18 1532 04/25/18 2057 04/26/18 0037 04/26/18 0421 04/26/18 0829 04/27/18 0401  HGB 8.9*  --   --   --   --  8.8* 9.2*  HCT 30.1*  --   --   --   --  29.7* 30.7*  PLT 355  --   --   --   --  344 416*  LABPROT 54.0*  --   --   --  58.3*  --  36.8*  INR 6.21*  --   --   --  6.87*  --  3.80  CREATININE 1.06*  --   --   --   --  1.11* 1.11*  TROPONINI  --  <0.03 <0.03 <0.03  --   --   --     Estimated Creatinine Clearance: 26.8 mL/min (A) (by C-G formula based on SCr of 1.11 mg/dL (H)).   Medical History: Past Medical History:  Diagnosis Date  .  Acute respiratiory failure requiring BiPap 06/24/2011  . Allergy   . Aortic valvular stenosis, moderate to severe 06/24/2011   a. 10/2012 Echo: mod AS, Valve 1.25 cm^2 (VTI), 1.19cm^2 (Vmax).  . Arthritis    "back" (10/30/2012)  . Chronic diastolic CHF (congestive heart failure) ([place])    a. 10/2012 Echo: EF 55-60%, no rwma, Gr 2 DD, moderate AS, mod dil LA, mildly to mod dil RA.  Marland Kitchen Coronary artery disease    a. 03/2009 PCI RCA (3.0x18 BMS).  . Dyslipidemia   . Exertional shortness of breath   . Hypertension   . Hypothyroidism   . PAF (paroxysmal atrial fibrillation) ([place])    a. CHA2DS2VASc = 7-->chronic  coumadin;  b. s/p DCCV 05/2011, 05/2012, 07/2014;  c. On amio.  . Rash    a. felt to be 2/2 lasix/torsemide in setting of sulfa allergy (lasix d/c ~ 06/2014, torsemide d/c 08/09/2014).    Assessment: 31 YOF presented with weakness, SOB and palpitations.  Patient is in Afib with RVR (CHADSVASC ~ 6) and started on diltiazem infusion. Pharmacy consulted to manage Coumadin from PTA. INR is supratherapeutic on admit; FOB negative.  INR continued to trend up to 6.87 yesterday but now down to 3.8 after receiving vitamin K. Thoracentesis planned when INR<2.5. Hg low but stable, plt wnl. No active bleed issues documented.  Home Coumadin dose: 2.5mg  daily except 1.25 on Fri, last dose 11/28  Goal of Therapy:  INR 2-3 Monitor platelets by anticoagulation protocol: Yes   Plan:  Hold Coumadin today - awaiting INR<2.5 for thoracentesis Monitor daily INR, CBC, s/sx bleeding  [name], PharmD, BCPS Clinical Pharmacist Clinical phone [phone] Please check AMION for all [place] contact numbers 04/27/2018 9:55 AM

## 2018-04-28 ENCOUNTER — Inpatient Hospital Stay (HOSPITAL_COMMUNITY): Payer: Medicare Other

## 2018-04-28 ENCOUNTER — Other Ambulatory Visit: Payer: Self-pay

## 2018-04-28 ENCOUNTER — Encounter: Payer: Self-pay | Admitting: Physician Assistant

## 2018-04-28 DIAGNOSIS — R0602 Shortness of breath: Secondary | ICD-10-CM

## 2018-04-28 DIAGNOSIS — J181 Lobar pneumonia, unspecified organism: Secondary | ICD-10-CM

## 2018-04-28 HISTORY — PX: IR THORACENTESIS ASP PLEURAL SPACE W/IMG GUIDE: IMG5380

## 2018-04-28 LAB — ECHOCARDIOGRAM COMPLETE
Height: 63 in
Weight: 2150.4 oz

## 2018-04-28 LAB — BODY FLUID CELL COUNT WITH DIFFERENTIAL
Eos, Fluid: 0 %
Lymphs, Fluid: 3 %
Monocyte-Macrophage-Serous Fluid: 37 % — ABNORMAL LOW (ref 50–90)
Neutrophil Count, Fluid: 60 % — ABNORMAL HIGH (ref 0–25)
Other Cells, Fluid: 0 %
Total Nucleated Cell Count, Fluid: 610 cu mm (ref 0–1000)

## 2018-04-28 LAB — GRAM STAIN

## 2018-04-28 LAB — BASIC METABOLIC PANEL
Anion gap: 9 (ref 5–15)
BUN: 27 mg/dL — ABNORMAL HIGH (ref 8–23)
CO2: 23 mmol/L (ref 22–32)
Calcium: 8.5 mg/dL — ABNORMAL LOW (ref 8.9–10.3)
Chloride: 105 mmol/L (ref 98–111)
Creatinine, Ser: 1.08 mg/dL — ABNORMAL HIGH (ref 0.44–1.00)
GFR calc Af Amer: 52 mL/min — ABNORMAL LOW (ref >60)
GFR calc non Af Amer: 45 mL/min — ABNORMAL LOW (ref >60)
GLUCOSE: 120 mg/dL — AB (ref 70–99)
Potassium: 4.4 mmol/L (ref 3.5–5.1)
Sodium: 137 mmol/L (ref 135–145)

## 2018-04-28 LAB — CBC
HCT: 29.4 % — ABNORMAL LOW (ref 36.0–46.0)
Hemoglobin: 9 g/dL — ABNORMAL LOW (ref 12.0–15.0)
MCH: 27.4 pg (ref 26.0–34.0)
MCHC: 30.6 g/dL (ref 30.0–36.0)
MCV: 89.6 fL (ref 80.0–100.0)
PLATELETS: 376 10*3/uL (ref 150–400)
RBC: 3.28 MIL/uL — ABNORMAL LOW (ref 3.87–5.11)
RDW: 15.1 % (ref 11.5–15.5)
WBC: 15.5 10*3/uL — ABNORMAL HIGH (ref 4.0–10.5)
nRBC: 0 % (ref 0.0–0.2)

## 2018-04-28 LAB — PROTEIN, PLEURAL OR PERITONEAL FLUID: Total protein, fluid: 3.1 g/dL

## 2018-04-28 LAB — STREP PNEUMONIAE URINARY ANTIGEN: Strep Pneumo Urinary Antigen: NEGATIVE

## 2018-04-28 LAB — PROTIME-INR
INR: 2.23
Prothrombin Time: 24.4 seconds — ABNORMAL HIGH (ref 11.4–15.2)

## 2018-04-28 MED ORDER — LIDOCAINE HCL (PF) 1 % IJ SOLN
INTRAMUSCULAR | Status: DC | PRN
  Administered 2018-04-28: 10 mL

## 2018-04-28 MED ORDER — ALPRAZOLAM 0.25 MG PO TABS
0.2500 mg | ORAL_TABLET | Freq: Every evening | ORAL | Status: DC | PRN
  Administered 2018-04-28 – 2018-04-30 (×3): 0.25 mg via ORAL
  Filled 2018-04-28 (×3): qty 1

## 2018-04-28 MED ORDER — DILTIAZEM HCL 60 MG PO TABS
60.0000 mg | ORAL_TABLET | Freq: Three times a day (TID) | ORAL | Status: DC
  Administered 2018-04-28 – 2018-05-01 (×10): 60 mg via ORAL
  Filled 2018-04-28 (×10): qty 1

## 2018-04-28 MED ORDER — LIDOCAINE HCL 1 % IJ SOLN
INTRAMUSCULAR | Status: AC
  Filled 2018-04-28: qty 20

## 2018-04-28 MED ORDER — WARFARIN SODIUM 2.5 MG PO TABS
2.5000 mg | ORAL_TABLET | Freq: Once | ORAL | Status: AC
  Administered 2018-04-28: 2.5 mg via ORAL
  Filled 2018-04-28: qty 1

## 2018-04-28 MED ORDER — WARFARIN - PHARMACIST DOSING INPATIENT: Freq: Every day | Status: DC

## 2018-04-28 MED ORDER — SODIUM CHLORIDE 0.9 % IV SOLN
1.0000 g | INTRAVENOUS | Status: DC
  Administered 2018-04-28 – 2018-05-01 (×4): 1 g via INTRAVENOUS
  Filled 2018-04-28 (×4): qty 1

## 2018-04-28 MED ORDER — PERFLUTREN LIPID MICROSPHERE
1.0000 mL | INTRAVENOUS | Status: DC | PRN
  Administered 2018-04-28: 2 mL via INTRAVENOUS
  Filled 2018-04-28: qty 10

## 2018-04-28 NOTE — Procedures (Signed)
PROCEDURE SUMMARY:  Successful image-guided left sided thoracentesis. Yielded 620 milliliters of golden yellow fluid. Patient tolerated procedure well. No immediate complications.  Specimen was sent for labs. CXR ordered.  Joaquim Nam PA-C 04/28/2018 1:57 PM

## 2018-04-28 NOTE — Progress Notes (Signed)
Patient returned from thoracentesis. Bandaid to LL back CDI.  VSS.  Denies pain/SOB.

## 2018-04-28 NOTE — Progress Notes (Signed)
Pharmacy Antibiotic Note  [name] is a 84 y.o. female admitted on 04/25/2018 with pneumonia.  Pharmacy has been consulted for cefepime dosing. SCr stable 1.08, CrCL~25-30.  Plan: Cefepime 1g IV q24h Monitor clinical progress, c/s, renal function F/u de-escalation plan/LOT  Height: 5\' 3"  (160 cm) Weight: 134 lb 6.4 oz (61 kg) IBW/kg (Calculated) : 52.4  Temp (24hrs), Avg:98.7 F (37.1 C), Min:98 F (36.7 C), Max:99.5 F (37.5 C)  Recent Labs  Lab 04/25/18 0834 04/26/18 0829 04/27/18 0401 04/28/18 0427  WBC 9.8 9.1 12.4* 15.5*  CREATININE 1.06* 1.11* 1.11* 1.08*    Estimated Creatinine Clearance: 27.5 mL/min (A) (by C-G formula based on SCr of 1.08 mg/dL (H)).    Allergies  Allergen Reactions  . [name] [Ethacrynic Acid] Nausea And Vomiting  . Furosemide Rash  . Sulfa Antibiotics Itching and Rash  . Tape Rash and Other (See Comments)    Tears skin off.  Please use "paper" tape only.   . [name] Rash    Per Dr Sallyanne Kuster, had a skin reaction with thiazides prior to use of Lasix  . Torsemide Rash    [name], PharmD, BCPS Clinical Pharmacist Clinical phone [phone] Please check AMION for all [place] contact numbers 04/28/2018 9:09 AM

## 2018-04-28 NOTE — Progress Notes (Signed)
ANTICOAGULATION CONSULT NOTE   Pharmacy Consult: Warfarin Indication: atrial fibrillation  Allergies  Allergen Reactions  . [name] [Ethacrynic Acid] Nausea And Vomiting  . Furosemide Rash  . Sulfa Antibiotics Itching and Rash  . Tape Rash and Other (See Comments)    Tears skin off.  Please use "paper" tape only.   . [name] Rash    Per Dr Sallyanne Kuster, had a skin reaction with thiazides prior to use of Lasix  . Torsemide Rash    Patient Measurements: Height: 5\' 3"  (160 cm) Weight: 134 lb 6.4 oz (61 kg) IBW/kg (Calculated) : 52.4  Vital Signs: Temp: 99.5 F (37.5 C) (12/03 0605) Temp Source: Oral (12/03 0605) BP: 112/65 (12/03 1423) Pulse Rate: 69 (12/03 1417)  Labs: Recent Labs    04/25/18 1532 04/25/18 2057 04/26/18 0037  04/26/18 0829 04/27/18 0401 04/27/18 1409 04/28/18 0427  HGB  --   --   --    < > 8.8* 9.2*  --  9.0*  HCT  --   --   --   --  29.7* 30.7*  --  29.4*  PLT  --   --   --   --  344 416*  --  376  LABPROT  --   --   --    < >  --  36.8* 31.3* 24.4*  INR  --   --   --    < >  --  3.80 3.08 2.23  CREATININE  --   --   --   --  1.11* 1.11*  --  1.08*  TROPONINI <0.03 <0.03 <0.03  --   --   --   --   --    < > = values in this interval not displayed.    Estimated Creatinine Clearance: 27.5 mL/min (A) (by C-G formula based on SCr of 1.08 mg/dL (H)).   Medical History: Past Medical History:  Diagnosis Date  .  Acute respiratiory failure requiring BiPap 06/24/2011  . Allergy   . Aortic valvular stenosis, moderate to severe 06/24/2011   a. 10/2012 Echo: mod AS, Valve 1.25 cm^2 (VTI), 1.19cm^2 (Vmax).  . Arthritis    "back" (10/30/2012)  . Chronic diastolic CHF (congestive heart failure) ([place])    a. 10/2012 Echo: EF 55-60%, no rwma, Gr 2 DD, moderate AS, mod dil LA, mildly to mod dil RA.  Marland Kitchen Coronary artery disease    a. 03/2009 PCI RCA (3.0x18 BMS).  . Dyslipidemia   . Exertional shortness of breath   . Hypertension   . Hypothyroidism    . PAF (paroxysmal atrial fibrillation) ([place])    a. CHA2DS2VASc = 7-->chronic coumadin;  b. s/p DCCV 05/2011, 05/2012, 07/2014;  c. On amio.  . Rash    a. felt to be 2/2 lasix/torsemide in setting of sulfa allergy (lasix d/c ~ 06/2014, torsemide d/c 08/09/2014).    Assessment: 14 YOF presented with weakness, SOB and palpitations.  Patient is in Afib with RVR (CHADSVASC ~ 6) and started on diltiazem infusion. Pharmacy consulted to manage Coumadin from PTA. INR is supratherapeutic on admit; FOB negative.  Warfarin has been held since admission and vitamin K given x2 to reverse INR for thoracentesis. INR today 2.23 and s/p thoracentesis, pharmacy consulted to resume warfarin this evening 12/3. Home dose noted below, given elevated INR on admission will dose cautiously.  Home warfarin dose: 2.5mg  daily except 1.25 on Fri, last dose 11/28  Goal of Therapy:  INR 2-3 Monitor platelets by anticoagulation protocol:  Yes   Plan:  -Warfarin 2.5mg  PO x1 tonight -Daily protime  Arrie Senate, PharmD, BCPS Clinical Pharmacist [phone] Please check AMION for all [place] numbers 04/28/2018

## 2018-04-28 NOTE — Progress Notes (Signed)
2D Echocardiogram has been performed.  [name] 04/28/2018, 10:35 AM

## 2018-04-28 NOTE — Progress Notes (Signed)
PROGRESS NOTE  [name]  KXF:[id number] DOB: 06-26-1923 DOA: 04/25/2018 PCP: [name], MD   Brief Narrative: [name] is a 84 y.o. female with medical history significant of afib on coumadin; hypothyroidism; HTN; HLD; CAD s/p PCI; and chronic diastolic CHF presented with weakness and palpitations associated with dyspnea on 11/30. Found to be in AFib w/RVR, started on diltiazem gtt. INR 6.21 so coumadin held. CXR demonstrated increasing left pleural effusion. Heart rate control continued with transition to po diltiazem. She continues to be hypoxic and symptomatic from pleural effusion, so thoracentesis is planned. Vitamin K administered. Awaiting INR <2.5 per IR.  Assessment & Plan: Principal Problem:   Atrial fibrillation with RVR (HCC) Active Problems:   Hyperlipidemia   Pleural effusion   Angioimmunoblastic lymphoma (HCC)   CKD (chronic kidney disease) stage 3, GFR 30-59 ml/min (HCC)   Anemia   Supratherapeutic INR  Afib with RVR: Uncertain precipitant ?URI with rhinorrhea. Troponin negative, updated echo without significant WMA or LV dysfunction. - Continue diltiazem po, metoprolol. Had return of RVR last night with decrease dilt dose due to bradycardic response. Will increase dose modestly and monitor.  - Restart coumadin today.  Supratherapeutic INR: Unclear precipitant, denies change in meds or diet. Given vitamin K to bring INR < 2.5 for thoracentesis. 2.23 this AM.  - Restart coumadin per pharmacy this evening - Monitor PT-INR daily  Anemia of chronic disease: Below baseline but stable with no active bleeding - Check FOBT - Monitor CBC  Pleural effusion on left: Symptomatic. Has hx of this in the past, though enlarged now. Despite improved rate control continued to enlarge. ?parapneumonic.  - Thoracentesis 12/3, will follow up labs: protein, cell count, gram stain and culture, and cytology given her h/o lymphoma.   Lymphoma: Last seen by Dr. Lindi Adie  in 8/19, under surveillance. - Given her advanced age, chemotherapy was not recommended.   Stage 3 CKD: Stable. - Monitor BMP.  HLD - Continue lipitor and zetia   LLL pneumonia: Increasing leukocytosis, low grade fever this AM. ?consolidation associated with pleural effusion which didn't decrease despite diuresis/rate control.  - Check sputum Cx, urine antigens. MRSA screen was negative, so will start cefepime monoTx.  DVT prophylaxis: Supratherapeutic INR > Coumadin Code Status: DNR Family Communication: None at bedside this AM, shared plan on phone this PM. Disposition Plan: Anticipate DC home if hypoxia on exertion resolved after thoracentesis and empiric abx for pneumonia. Needs ambulatory pulse oximetry 12/4.  Consultants:   IR  Procedures:   Left-sided thoracentesis: 614ml golden yellow fluid 04/28/2018.  Echocardiogram 04/28/2018 - Left ventricle: The cavity size was normal. Wall thickness was   increased in a pattern of mild LVH. Systolic function was normal.   The estimated ejection fraction was in the range of 55% to 60%.   Wall motion was normal; there were no regional wall motion   abnormalities. The study is not technically sufficient to allow   evaluation of LV diastolic function. Doppler parameters are   consistent with high ventricular filling pressure. - Aortic valve: Valve mobility was restricted. There was mild to   moderate stenosis. There was trivial regurgitation. - Mitral valve: Severely calcified annulus. Mildly thickened   leaflets . - Left atrium: The atrium was moderately dilated. - Pulmonary arteries: Systolic pressure was mildly increased. PA   peak pressure: 46 mm Hg (S). - Pericardium, extracardiac: A small pericardial effusion was   identified. There was a left pleural effusion.  Impressions:  -  Definity used; normal LV systolic function; mild LVH; calcified   aortic valve with mild to moderate AS (mean gradient 18 mmHg);   trace AI;  moderate LAE; mild TR; mild pulmonary hypertension;   probable small pericardial effusion.  Antimicrobials: Cefepime 12/3 >>   Subjective: Trouble taking a deep breath, having more shortness of breath than baseline. Denies chest pain, fever. +cough, nonproductive she thinks.   Objective: Vitals:   04/28/18 0605 04/28/18 0855 04/28/18 1417 04/28/18 1423  BP: 116/75 108/69 112/65 112/65  Pulse: 78 84 69   Resp:   (!) 26   Temp: 99.5 F (37.5 C)     TempSrc: Oral     SpO2: 93%  95%   Weight: 61 kg     Height:        Intake/Output Summary (Last 24 hours) at 04/28/2018 1425 Last data filed at 04/28/2018 0900 Gross per 24 hour  Intake 480 ml  Output 400 ml  Net 80 ml   Filed Weights   04/26/18 0517 04/27/18 0304 04/28/18 0605  Weight: 61.6 kg 61.1 kg 61 kg   Gen: Elderly female in no distress Pulm: Nonlabored tachypnea, decreased with crackles on left mid-lower zones. CV: Irreg irreg. No JVD, no dependent edema. GI: Abdomen soft, non-tender, non-distended, with normoactive bowel sounds.  Ext: Warm, no deformities Skin: No rashes, lesions or ulcers on visualized skin.  Neuro: Alert and oriented. No focal neurological deficits. Psych: Judgement and insight appear fair. Mood euthymic & affect congruent. Behavior is appropriate.    Data Reviewed: I have personally reviewed following labs and imaging studies  CBC: Recent Labs  Lab 04/25/18 0834 04/26/18 0829 04/27/18 0401 04/28/18 0427  WBC 9.8 9.1 12.4* 15.5*  HGB 8.9* 8.8* 9.2* 9.0*  HCT 30.1* 29.7* 30.7* 29.4*  MCV 94.1 92.2 90.3 89.6  PLT 355 344 416* 638   Basic Metabolic Panel: Recent Labs  Lab 04/25/18 0834 04/26/18 0829 04/27/18 0401 04/28/18 0427  NA 138 138 137 137  K 4.5 4.5 4.5 4.4  CL 105 106 104 105  CO2 22 23 27 23   GLUCOSE 109* 96 143* 120*  BUN 28* 21 23 27*  CREATININE 1.06* 1.11* 1.11* 1.08*  CALCIUM 8.8* 8.5* 8.5* 8.5*  MG 2.3  --   --   --    GFR: Estimated Creatinine Clearance: 27.5  mL/min (A) (by C-G formula based on SCr of 1.08 mg/dL (H)). Liver Function Tests: No results for input(s): AST, ALT, ALKPHOS, BILITOT, PROT, ALBUMIN in the last 168 hours. No results for input(s): LIPASE, AMYLASE in the last 168 hours. No results for input(s): AMMONIA in the last 168 hours. Coagulation Profile: Recent Labs  Lab 04/25/18 0834 04/26/18 0421 04/27/18 0401 04/27/18 1409 04/28/18 0427  INR 6.21* 6.87* 3.80 3.08 2.23   Cardiac Enzymes: Recent Labs  Lab 04/25/18 1532 04/25/18 2057 04/26/18 0037  TROPONINI <0.03 <0.03 <0.03   BNP (last 3 results) No results for input(s): PROBNP in the last 8760 hours. HbA1C: No results for input(s): HGBA1C in the last 72 hours. CBG: No results for input(s): GLUCAP in the last 168 hours. Lipid Profile: No results for input(s): CHOL, HDL, LDLCALC, TRIG, CHOLHDL, LDLDIRECT in the last 72 hours. Thyroid Function Tests: No results for input(s): TSH, T4TOTAL, FREET4, T3FREE, THYROIDAB in the last 72 hours. Anemia Panel: No results for input(s): VITAMINB12, FOLATE, FERRITIN, TIBC, IRON, RETICCTPCT in the last 72 hours. Urine analysis:    Component Value Date/Time   COLORURINE YELLOW 04/25/2018 2100  APPEARANCEUR CLEAR 04/25/2018 2100   LABSPEC 1.019 04/25/2018 2100   PHURINE 5.0 04/25/2018 2100   GLUCOSEU NEGATIVE 04/25/2018 2100   HGBUR SMALL (A) 04/25/2018 2100   [place] NEGATIVE 04/25/2018 2100   [place] NEGATIVE 04/25/2018 2100   PROTEINUR NEGATIVE 04/25/2018 2100   UROBILINOGEN 1.0 11/01/2012 1849   NITRITE NEGATIVE 04/25/2018 2100   LEUKOCYTESUR NEGATIVE 04/25/2018 2100   Recent Results (from the past 240 hour(s))  MRSA PCR Screening     Status: None   Collection Time: 04/25/18 11:46 AM  Result Value Ref Range Status   MRSA by PCR NEGATIVE NEGATIVE Final    Comment:        The GeneXpert MRSA Assay (FDA approved for NASAL specimens only), is one component of a comprehensive MRSA colonization surveillance  program. It is not intended to diagnose MRSA infection nor to guide or monitor treatment for MRSA infections. Performed at [facility], [place] [address], [place], [place] [address]   Urine culture     Status: None   Collection Time: 04/26/18  5:50 AM  Result Value Ref Range Status   Specimen Description URINE, RANDOM  Final   Special Requests NONE  Final   Culture   Final    NO GROWTH Performed at [facility], [place] [address], [place], [place] [address]    Report Status 04/27/2018 FINAL  Final      Radiology Studies: Dg Chest 1 View  Result Date: 04/28/2018 CLINICAL DATA:  Post thoracentesis apparently on the left EXAM: CHEST  1 VIEW COMPARISON:  Chest x-ray of 04/26/2017 FINDINGS: It appears that the left thoracentesis has been performed with significant reduction in volume of the left pleural effusion. No pneumothorax is seen. The right lung is clear. Cardiomegaly is stable. Left shoulder replacement is noted. IMPRESSION: 1. Decrease in volume of left pleural effusion after left thoracentesis. 2. No pneumothorax.  Stable cardiomegaly. Electronically Signed   By: Ivar Drape M.D.   On: 04/28/2018 14:16   Ir Thoracentesis Asp Pleural Space W/img Guide  Result Date: 04/28/2018 Lynnea Ferrier     04/28/2018  1:57 PM PROCEDURE SUMMARY: Successful image-guided left sided thoracentesis. Yielded 620 milliliters of golden yellow fluid. Patient tolerated procedure well. No immediate complications. Specimen was sent for labs. CXR ordered. Joaquim Nam PA-C 04/28/2018 1:57 PM    Scheduled Meds: . atorvastatin  40 mg Oral q1800  . diltiazem  60 mg Oral Q8H  . ezetimibe  10 mg Oral Daily  . folic acid  2 mg Oral Daily  . ipratropium  2 spray Each Nare Daily  . levothyroxine  88 mcg Oral QAC breakfast  . lidocaine      . loratadine  10 mg Oral Daily  . metoprolol tartrate  25 mg Oral BID  . multivitamin with minerals  1 tablet Oral Daily  . vitamin C  500 mg  Oral Daily   Continuous Infusions: . ceFEPime (MAXIPIME) IV 1 g (04/28/18 1046)     LOS: 2 days   Time spent: 35 minutes.  Patrecia Pour, MD [facility] [email or web address] Password [email or web address] 04/28/2018, 2:25 PM

## 2018-04-29 LAB — BASIC METABOLIC PANEL
Anion gap: 12 (ref 5–15)
BUN: 34 mg/dL — ABNORMAL HIGH (ref 8–23)
CO2: 22 mmol/L (ref 22–32)
Calcium: 8.5 mg/dL — ABNORMAL LOW (ref 8.9–10.3)
Chloride: 105 mmol/L (ref 98–111)
Creatinine, Ser: 1.18 mg/dL — ABNORMAL HIGH (ref 0.44–1.00)
GFR calc non Af Amer: 40 mL/min — ABNORMAL LOW (ref >60)
GFR, EST AFRICAN AMERICAN: 46 mL/min — AB (ref >60)
Glucose, Bld: 123 mg/dL — ABNORMAL HIGH (ref 70–99)
Potassium: 4.4 mmol/L (ref 3.5–5.1)
SODIUM: 139 mmol/L (ref 135–145)

## 2018-04-29 LAB — PROTIME-INR
INR: 1.61
Prothrombin Time: 18.9 seconds — ABNORMAL HIGH (ref 11.4–15.2)

## 2018-04-29 LAB — CBC
HEMATOCRIT: 30.2 % — AB (ref 36.0–46.0)
Hemoglobin: 9.3 g/dL — ABNORMAL LOW (ref 12.0–15.0)
MCH: 27.4 pg (ref 26.0–34.0)
MCHC: 30.8 g/dL (ref 30.0–36.0)
MCV: 89.1 fL (ref 80.0–100.0)
Platelets: 399 10*3/uL (ref 150–400)
RBC: 3.39 MIL/uL — ABNORMAL LOW (ref 3.87–5.11)
RDW: 15.1 % (ref 11.5–15.5)
WBC: 14.9 10*3/uL — ABNORMAL HIGH (ref 4.0–10.5)
nRBC: 0 % (ref 0.0–0.2)

## 2018-04-29 LAB — LEGIONELLA PNEUMOPHILA SEROGP 1 UR AG: L. pneumophila Serogp 1 Ur Ag: NEGATIVE

## 2018-04-29 MED ORDER — WARFARIN SODIUM 2.5 MG PO TABS
2.5000 mg | ORAL_TABLET | Freq: Once | ORAL | Status: AC
  Administered 2018-04-29: 2.5 mg via ORAL
  Filled 2018-04-29: qty 1

## 2018-04-29 NOTE — Progress Notes (Signed)
ANTICOAGULATION CONSULT NOTE   Pharmacy Consult: Warfarin Indication: atrial fibrillation  Allergies  Allergen Reactions  . [name] [Ethacrynic Acid] Nausea And Vomiting  . Furosemide Rash  . Sulfa Antibiotics Itching and Rash  . Tape Rash and Other (See Comments)    Tears skin off.  Please use "paper" tape only.   . [name] Rash    Per Dr Sallyanne Kuster, had a skin reaction with thiazides prior to use of Lasix  . Torsemide Rash    Patient Measurements: Height: 5\' 3"  (160 cm) Weight: 132 lb 9.6 oz (60.1 kg) IBW/kg (Calculated) : 52.4  Vital Signs: Temp: 98.7 F (37.1 C) (12/04 0317) Temp Source: Oral (12/04 0317) BP: 102/67 (12/04 0838) Pulse Rate: 95 (12/04 0838)  Labs: Recent Labs    04/27/18 0401 04/27/18 1409 04/28/18 0427 04/29/18 0309  HGB 9.2*  --  9.0* 9.3*  HCT 30.7*  --  29.4* 30.2*  PLT 416*  --  376 399  LABPROT 36.8* 31.3* 24.4* 18.9*  INR 3.80 3.08 2.23 1.61  CREATININE 1.11*  --  1.08* 1.18*    Estimated Creatinine Clearance: 25.2 mL/min (A) (by C-G formula based on SCr of 1.18 mg/dL (H)).   Medical History: Past Medical History:  Diagnosis Date  .  Acute respiratiory failure requiring BiPap 06/24/2011  . Allergy   . Aortic valvular stenosis, moderate to severe 06/24/2011   a. 10/2012 Echo: mod AS, Valve 1.25 cm^2 (VTI), 1.19cm^2 (Vmax).  . Arthritis    "back" (10/30/2012)  . Chronic diastolic CHF (congestive heart failure) ([place])    a. 10/2012 Echo: EF 55-60%, no rwma, Gr 2 DD, moderate AS, mod dil LA, mildly to mod dil RA.  Marland Kitchen Coronary artery disease    a. 03/2009 PCI RCA (3.0x18 BMS).  . Dyslipidemia   . Exertional shortness of breath   . Hypertension   . Hypothyroidism   . PAF (paroxysmal atrial fibrillation) ([place])    a. CHA2DS2VASc = 7-->chronic coumadin;  b. s/p DCCV 05/2011, 05/2012, 07/2014;  c. On amio.  . Rash    a. felt to be 2/2 lasix/torsemide in setting of sulfa allergy (lasix d/c ~ 06/2014, torsemide d/c 08/09/2014).     Assessment: 73 YOF presented with afib with RVR. Pharmacy consulted to manage Coumadin from PTA. INR supratherapeutic on admit; FOB negative.  Warfarin had been held since admission and vitamin K given x2 to reverse INR for thoracentesis - procedure completed 12/3. Pharmacy consulted to resume warfarin this evening 12/3. INR today down to 1.61. Home dose noted below, given elevated INR on admission will dose cautiously.  Home warfarin dose: 2.5mg  daily except 1.25 on Fri, last dose 11/28  Goal of Therapy:  INR 2-3 Monitor platelets by anticoagulation protocol: Yes   Plan:  -Warfarin 2.5mg  PO x1 tonight -Monitor daily INR, CBC, s/sx bleeding  [name], PharmD, BCPS Clinical Pharmacist Clinical phone [phone] Please check AMION for all [place] contact numbers 04/29/2018 9:40 AM

## 2018-04-29 NOTE — Evaluation (Signed)
Physical Therapy Evaluation Patient Details Name: [name] MRN: [id number] DOB: 1923/07/09 Today's Date: 04/29/2018   History of Present Illness  Pt is a 84 y/o female admitted secondary to a fib with RVR. Also found to have L plural effusion and is s/p L thoracentesis. PMH includes CKD, a fib, HTN, CAD s/p PCI, and dCHF.   Clinical Impression  Pt admitted secondary to problem above with deficits below. Pt requiring min to min guard A for mobility using RW. Did require safety cues throughout for safe use of RW and to avoid obstacles in the hall. Oxygen sats ranging from 90-93% on RA throughout gait training. No family present, however, pt reports husband is able to provide assist at home. Will need to ensure 24/7 assist at home for safety. If family unable to provide 24/7 assist, may need to consider short term SNF prior to d/c home. Will continue to follow acutely to maximize functional mobility independence and safety.     Follow Up Recommendations Home health PT;Supervision/Assistance - 24 hour    Equipment Recommendations  Rolling walker with 5" wheels    Recommendations for Other Services       Precautions / Restrictions Precautions Precautions: Fall Restrictions Weight Bearing Restrictions: No      Mobility  Bed Mobility Overal bed mobility: Needs Assistance Bed Mobility: Supine to Sit;Sit to Supine     Supine to sit: Supervision Sit to supine: Supervision   General bed mobility comments: Supervision for safety. Increased time required to perform bed mobility tasks.   Transfers Overall transfer level: Needs assistance Equipment used: None;Rolling walker (2 wheeled) Transfers: Sit to/from Stand Sit to Stand: Min assist;Min guard         General transfer comment: Min A for lift assist and steadying without use of AD. Improved steadiness noted with use of RW.   Ambulation/Gait Ambulation/Gait assistance: Min guard Gait Distance (Feet): 175 Feet Assistive  device: Rolling walker (2 wheeled) Gait Pattern/deviations: Step-through pattern;Decreased stride length;Trunk flexed Gait velocity: Decreased   General Gait Details: Slow, mildly unsteady gait, however, no LOB noted. Cues to push RW instead of picking it up. Also required cues to avoid obstacles in the hallway. Min guard for steadying assist. Educated to use RW at home to increase stability.   Stairs            Wheelchair Mobility    Modified Rankin (Stroke Patients Only)       Balance Overall balance assessment: Needs assistance Sitting-balance support: No upper extremity supported;Feet supported Sitting balance-Leahy Scale: Fair     Standing balance support: Bilateral upper extremity supported;During functional activity Standing balance-Leahy Scale: Poor Standing balance comment: Reliant on BUE support.                              Pertinent Vitals/Pain Pain Assessment: No/denies pain    Home Living Family/patient expects to be discharged to:: Private residence Living Arrangements: Spouse/significant other Available Help at Discharge: Family;Available 24 hours/day Type of Home: House Home Access: Stairs to enter Entrance Stairs-Rails: Right;Left;Can reach both Entrance Stairs-Number of Steps: 3 Home Layout: One level Home Equipment: Walker - 2 wheels;Shower seat;Grab bars - tub/shower      Prior Function Level of Independence: Independent               Hand Dominance        Extremity/Trunk Assessment   Upper Extremity Assessment Upper Extremity Assessment: Defer to OT  evaluation    Lower Extremity Assessment Lower Extremity Assessment: Generalized weakness    Cervical / Trunk Assessment Cervical / Trunk Assessment: Kyphotic  Communication   Communication: No difficulties  Cognition Arousal/Alertness: Awake/alert Behavior During Therapy: WFL for tasks assessed/performed Overall Cognitive Status: No family/caregiver present to  determine baseline cognitive functioning                                 General Comments: Somewhat slow to answer questions and reports not feeling like herself      General Comments General comments (skin integrity, edema, etc.): Oxygen sats at 90-93% on RA throughout session. No family present during session.     Exercises     Assessment/Plan    PT Assessment Patient needs continued PT services  PT Problem List Decreased strength;Decreased balance;Decreased mobility;Decreased knowledge of use of DME;Decreased knowledge of precautions       PT Treatment Interventions DME instruction;Gait training;Stair training;Functional mobility training;Therapeutic exercise;Therapeutic activities;Balance training;Patient/family education    PT Goals (Current goals can be found in the Care Plan section)  Acute Rehab PT Goals Patient Stated Goal: to go home PT Goal Formulation: With patient Time For Goal Achievement: 05/13/18 Potential to Achieve Goals: Good    Frequency Min 3X/week   Barriers to discharge Other (comment) Unsure of caregiver support     Co-evaluation               AM-PAC PT "6 Clicks" Mobility  Outcome Measure Help needed turning from your back to your side while in a flat bed without using bedrails?: None Help needed moving from lying on your back to sitting on the side of a flat bed without using bedrails?: A Little Help needed moving to and from a bed to a chair (including a wheelchair)?: A Little Help needed standing up from a chair using your arms (e.g., wheelchair or bedside chair)?: A Little Help needed to walk in hospital room?: A Little Help needed climbing 3-5 steps with a railing? : A Lot 6 Click Score: 18    End of Session Equipment Utilized During Treatment: Gait belt Activity Tolerance: Patient tolerated treatment well Patient left: in bed;with call bell/phone within reach;with bed alarm set Nurse Communication: Mobility  status PT Visit Diagnosis: Unsteadiness on feet (R26.81);Muscle weakness (generalized) (M62.81)    Time: 4818-5631 PT Time Calculation (min) (ACUTE ONLY): 18 min   Charges:   PT Evaluation $PT Eval Low Complexity: [place], PT, DPT  Acute Rehabilitation Services  Pager: [phone] Office: [phone]   Rudean Hitt 04/29/2018, 5:57 PM

## 2018-04-29 NOTE — Progress Notes (Signed)
PROGRESS NOTE    [name]  ZSW:[id number] DOB: 03/20/1924 DOA: 04/25/2018 PCP: [name], MD    Brief Narrative:  84 y.o.femalewith medical history significant ofafib on coumadin; hypothyroidism; HTN; HLD; CAD s/p PCI; and chronic diastolic CHF presented with weakness and palpitations associated with dyspnea on 11/30. Found to be in AFib w/RVR, started on diltiazem gtt. INR 6.21 so coumadin held. CXR demonstrated increasing left pleural effusion. Heart rate control continued with transition to po diltiazem. She continues to be hypoxic and symptomatic from pleural effusion, so thoracentesis is planned. Vitamin K administered. Awaiting INR <2.5 per IR.  Assessment & Plan:   Principal Problem:   Atrial fibrillation with RVR (HCC) Active Problems:   Hyperlipidemia   Pleural effusion   Angioimmunoblastic lymphoma (HCC)   CKD (chronic kidney disease) stage 3, GFR 30-59 ml/min (HCC)   Anemia   Supratherapeutic INR  Afib with RVR: Uncertain precipitant ?URI with rhinorrhea. Troponin negative, updated echo without significant WMA or LV dysfunction. - Continue diltiazem po, metoprolol. -Presently rate controlled - Restarted coumadin 12/3  Supratherapeutic INR: Unclear precipitant, denies change in meds or diet. Given vitamin K to bring INR < 2.5 for thoracentesis. - Restart coumadin per pharmacy  -INR subtherapeutic at 1.6, continue coumadin with goal 2-3 INR  Anemia of chronic disease:  -No evidence of active bleed - FOBT neg -Hgb stable  Pleural effusion on left: Symptomatic. Has hx of this in the past, though enlarged now. Despite improved rate control continued to enlarge. ?parapneumonic.  - Thoracentesis 12/3, will follow up labs: protein, cell count, gram stain and culture, and cytology given her h/o lymphoma.  - Transudative fluid per Lite's criteria, protein analysis  Lymphoma: Last seen by Dr. Lindi Adie in 8/19, under surveillance. - Given her advanced age,  chemotherapy was not recommended.  -Seems stable for now  Stage 3 CKD: Stable. - repeat bmet in AM -Stable.  HLD - Continue lipitor and zetia  LLL pneumonia:  -recent low grade temp with wising WBC. WBC now 14.9 from 15.5 - On cefepime monoTx. -Afebrile. Still tachypnea this AM. Continue abx with lasix per above   DVT prophylaxis: coumadin Code Status: DNR Family Communication: Pt in room Disposition Plan: Uncertain at this time, PT/OT eval, given age and frailty, will need continued inpt stay  Consultants:     Procedures:     Antimicrobials: Anti-infectives (From admission, onward)   Start     Dose/Rate Route Frequency Ordered Stop   04/28/18 0915  ceFEPIme (MAXIPIME) 1 g in sodium chloride 0.9 % 100 mL IVPB     1 g 200 mL/hr over 30 Minutes Intravenous Every 24 hours 04/28/18 0909         Subjective: States breathing better today. Reports feeling weak  Objective: Vitals:   04/28/18 2012 04/29/18 0317 04/29/18 0801 04/29/18 0838  BP: 112/71 116/62 102/67 102/67  Pulse: (!) 105 80 87 95  Resp: (!) 24 20 (!) 21   Temp: 98.1 F (36.7 C) 98.7 F (37.1 C)    TempSrc: Axillary Oral    SpO2: 95% 94% 95%   Weight:  60.1 kg    Height:        Intake/Output Summary (Last 24 hours) at 04/29/2018 1014 Last data filed at 04/29/2018 0912 Gross per 24 hour  Intake 610 ml  Output 450 ml  Net 160 ml   Filed Weights   04/27/18 0304 04/28/18 0605 04/29/18 0317  Weight: 61.1 kg 61 kg 60.1 kg  Examination:  General exam: Appears calm and comfortable  Respiratory system: Clear to auscultation. Respiratory effort normal. Cardiovascular system: S1 & S2 heard, RRR Gastrointestinal system: Abdomen is nondistended, soft and nontender. No organomegaly or masses felt. Normal bowel sounds heard. Central nervous system: Alert and oriented. No focal neurological deficits. Extremities: Symmetric 5 x 5 power. Skin: No rashes, lesions Psychiatry: Judgement and insight  appear normal. Mood & affect appropriate.   Data Reviewed: I have personally reviewed following labs and imaging studies  CBC: Recent Labs  Lab 04/25/18 0834 04/26/18 0829 04/27/18 0401 04/28/18 0427 04/29/18 0309  WBC 9.8 9.1 12.4* 15.5* 14.9*  HGB 8.9* 8.8* 9.2* 9.0* 9.3*  HCT 30.1* 29.7* 30.7* 29.4* 30.2*  MCV 94.1 92.2 90.3 89.6 89.1  PLT 355 344 416* 376 762   Basic Metabolic Panel: Recent Labs  Lab 04/25/18 0834 04/26/18 0829 04/27/18 0401 04/28/18 0427 04/29/18 0309  NA 138 138 137 137 139  K 4.5 4.5 4.5 4.4 4.4  CL 105 106 104 105 105  CO2 22 23 27 23 22   GLUCOSE 109* 96 143* 120* 123*  BUN 28* 21 23 27* 34*  CREATININE 1.06* 1.11* 1.11* 1.08* 1.18*  CALCIUM 8.8* 8.5* 8.5* 8.5* 8.5*  MG 2.3  --   --   --   --    GFR: Estimated Creatinine Clearance: 25.2 mL/min (A) (by C-G formula based on SCr of 1.18 mg/dL (H)). Liver Function Tests: No results for input(s): AST, ALT, ALKPHOS, BILITOT, PROT, ALBUMIN in the last 168 hours. No results for input(s): LIPASE, AMYLASE in the last 168 hours. No results for input(s): AMMONIA in the last 168 hours. Coagulation Profile: Recent Labs  Lab 04/26/18 0421 04/27/18 0401 04/27/18 1409 04/28/18 0427 04/29/18 0309  INR 6.87* 3.80 3.08 2.23 1.61   Cardiac Enzymes: Recent Labs  Lab 04/25/18 1532 04/25/18 2057 04/26/18 0037  TROPONINI <0.03 <0.03 <0.03   BNP (last 3 results) No results for input(s): PROBNP in the last 8760 hours. HbA1C: No results for input(s): HGBA1C in the last 72 hours. CBG: No results for input(s): GLUCAP in the last 168 hours. Lipid Profile: No results for input(s): CHOL, HDL, LDLCALC, TRIG, CHOLHDL, LDLDIRECT in the last 72 hours. Thyroid Function Tests: No results for input(s): TSH, T4TOTAL, FREET4, T3FREE, THYROIDAB in the last 72 hours. Anemia Panel: No results for input(s): VITAMINB12, FOLATE, FERRITIN, TIBC, IRON, RETICCTPCT in the last 72 hours. Sepsis Labs: No results for  input(s): PROCALCITON, LATICACIDVEN in the last 168 hours.  Recent Results (from the past 240 hour(s))  MRSA PCR Screening     Status: None   Collection Time: 04/25/18 11:46 AM  Result Value Ref Range Status   MRSA by PCR NEGATIVE NEGATIVE Final    Comment:        The GeneXpert MRSA Assay (FDA approved for NASAL specimens only), is one component of a comprehensive MRSA colonization surveillance program. It is not intended to diagnose MRSA infection nor to guide or monitor treatment for MRSA infections. Performed at [facility], [place] [address] [place], [place] [address]   Urine culture     Status: None   Collection Time: 04/26/18  5:50 AM  Result Value Ref Range Status   Specimen Description URINE, RANDOM  Final   Special Requests NONE  Final   Culture   Final    NO GROWTH Performed at [facility], [place] [address], [place], [place] [address]    Report Status 04/27/2018 FINAL  Final  Gram stain     Status: None   Collection Time: 04/28/18  2:07 PM  Result Value Ref Range Status   Specimen Description PLEURAL  Final   Special Requests LEFT  Final   Gram Stain   Final    WBC PRESENT,BOTH PMN AND MONONUCLEAR NO ORGANISMS SEEN CYTOSPIN SMEAR    Report Status 04/28/2018 FINAL  Final  Culture, body fluid-bottle     Status: None (Preliminary result)   Collection Time: 04/28/18  2:07 PM  Result Value Ref Range Status   Specimen Description PLEURAL  Final   Special Requests LEFT  Final   Culture   Final    NO GROWTH < 24 HOURS Performed at [facility], [place] [address] [place], [place] [address]    Report Status PENDING  Incomplete     Radiology Studies: Dg Chest 1 View  Result Date: 04/28/2018 CLINICAL DATA:  Post thoracentesis apparently on the left EXAM: CHEST  1 VIEW COMPARISON:  Chest x-ray of 04/26/2017 FINDINGS: It appears that the left thoracentesis has been performed with significant reduction in volume of the left pleural effusion. No  pneumothorax is seen. The right lung is clear. Cardiomegaly is stable. Left shoulder replacement is noted. IMPRESSION: 1. Decrease in volume of left pleural effusion after left thoracentesis. 2. No pneumothorax.  Stable cardiomegaly. Electronically Signed   By: Ivar Drape M.D.   On: 04/28/2018 14:16   Ir Thoracentesis Asp Pleural Space W/img Guide  Result Date: 04/28/2018 Lynnea Ferrier     04/28/2018  1:57 PM PROCEDURE SUMMARY: Successful image-guided left sided thoracentesis. Yielded 620 milliliters of golden yellow fluid. Patient tolerated procedure well. No immediate complications. Specimen was sent for labs. CXR ordered. Joaquim Nam PA-C 04/28/2018 1:57 PM    Scheduled Meds: . atorvastatin  40 mg Oral q1800  . diltiazem  60 mg Oral Q8H  . ezetimibe  10 mg Oral Daily  . folic acid  2 mg Oral Daily  . ipratropium  2 spray Each Nare Daily  . levothyroxine  88 mcg Oral QAC breakfast  . loratadine  10 mg Oral Daily  . metoprolol tartrate  25 mg Oral BID  . multivitamin with minerals  1 tablet Oral Daily  . vitamin C  500 mg Oral Daily  . warfarin  2.5 mg Oral ONCE-1800  . Warfarin - Pharmacist Dosing Inpatient   Does not apply q1800   Continuous Infusions: . ceFEPime (MAXIPIME) IV 1 g (04/29/18 0835)     LOS: 3 days   Marylu Lund, MD [facility] Pager On Amion  If 7PM-7AM, please contact night-coverage 04/29/2018, 10:14 AM

## 2018-04-30 DIAGNOSIS — R0609 Other forms of dyspnea: Secondary | ICD-10-CM

## 2018-04-30 LAB — BASIC METABOLIC PANEL
Anion gap: 11 (ref 5–15)
BUN: 32 mg/dL — ABNORMAL HIGH (ref 8–23)
CO2: 23 mmol/L (ref 22–32)
Calcium: 8.8 mg/dL — ABNORMAL LOW (ref 8.9–10.3)
Chloride: 107 mmol/L (ref 98–111)
Creatinine, Ser: 1.02 mg/dL — ABNORMAL HIGH (ref 0.44–1.00)
GFR calc Af Amer: 55 mL/min — ABNORMAL LOW (ref >60)
GFR calc non Af Amer: 48 mL/min — ABNORMAL LOW (ref >60)
Glucose, Bld: 124 mg/dL — ABNORMAL HIGH (ref 70–99)
Potassium: 4.3 mmol/L (ref 3.5–5.1)
SODIUM: 141 mmol/L (ref 135–145)

## 2018-04-30 LAB — CBC
HCT: 32.2 % — ABNORMAL LOW (ref 36.0–46.0)
Hemoglobin: 9.6 g/dL — ABNORMAL LOW (ref 12.0–15.0)
MCH: 26.8 pg (ref 26.0–34.0)
MCHC: 29.8 g/dL — AB (ref 30.0–36.0)
MCV: 89.9 fL (ref 80.0–100.0)
Platelets: 432 10*3/uL — ABNORMAL HIGH (ref 150–400)
RBC: 3.58 MIL/uL — ABNORMAL LOW (ref 3.87–5.11)
RDW: 15.2 % (ref 11.5–15.5)
WBC: 14.7 10*3/uL — ABNORMAL HIGH (ref 4.0–10.5)
nRBC: 0 % (ref 0.0–0.2)

## 2018-04-30 LAB — PROTIME-INR
INR: 1.85
Prothrombin Time: 21.1 seconds — ABNORMAL HIGH (ref 11.4–15.2)

## 2018-04-30 MED ORDER — WARFARIN SODIUM 2.5 MG PO TABS
2.5000 mg | ORAL_TABLET | Freq: Once | ORAL | Status: AC
  Administered 2018-04-30: 2.5 mg via ORAL
  Filled 2018-04-30: qty 1

## 2018-04-30 NOTE — Progress Notes (Signed)
Physical Therapy Treatment Patient Details Name: [name] MRN: [id number] DOB: Nov 06, 1923 Today's Date: 04/30/2018    History of Present Illness Pt is a 84 y/o female admitted secondary to a fib with RVR. Also found to have L plural effusion and is s/p L thoracentesis. PMH includes CKD, a fib, HTN, CAD s/p PCI, and dCHF.     PT Comments    Pt asleep on entry, awoken easily and agreeable to therapy.  Pt is limited in safe mobility by decreased balance and decreased knowledge of RW usage. Pt is supervision for bed mobility, min guard for transfers and ambulation of 225 feet with RW. Pt with 2/4 DoE post ambulation which quickly resolves, although pt reports being tired after walking. D/c plans remain appropriate, if the presence of 24 hour care is available. PT to work on stair training in next session to prepare for d/c home.    Follow Up Recommendations  Home health PT;Supervision/Assistance - 24 hour     Equipment Recommendations  Rolling walker with 5" wheels(youth walker)       Precautions / Restrictions Precautions Precautions: Fall Restrictions Weight Bearing Restrictions: No    Mobility  Bed Mobility Overal bed mobility: Needs Assistance Bed Mobility: Supine to Sit;Sit to Supine     Supine to sit: Supervision Sit to supine: Supervision   General bed mobility comments: Supervision for safety. Increased time required to perform bed mobility tasks, reliance on bedrails to pull to EoB  Transfers Overall transfer level: Needs assistance Equipment used: Rolling walker (2 wheeled) Transfers: Sit to/from Stand Sit to Stand: Min assist;Min guard         General transfer comment: min guard for sit>stand from EoB to RW, minA required for sit>stand from lower toilet surface  Ambulation/Gait Ambulation/Gait assistance: Min guard Gait Distance (Feet): 225 Feet Assistive device: Rolling walker (2 wheeled) Gait Pattern/deviations: Step-through pattern;Decreased stride  length;Trunk flexed Gait velocity: Decreased Gait velocity interpretation: <1.8 ft/sec, indicate of risk for recurrent falls General Gait Details: hands on min guard for safety, slow, shuffling gait, vc for avoidance of obstacles with RW, Continue to encourage use of RW at home          Balance Overall balance assessment: Needs assistance Sitting-balance support: No upper extremity supported;Feet supported Sitting balance-Leahy Scale: Fair     Standing balance support: Bilateral upper extremity supported;During functional activity Standing balance-Leahy Scale: Poor Standing balance comment: Reliant on BUE support.                             Cognition Arousal/Alertness: Awake/alert Behavior During Therapy: WFL for tasks assessed/performed Overall Cognitive Status: No family/caregiver present to determine baseline cognitive functioning                                 General Comments: Somewhat slow to answer questions and reports not feeling like herself         General Comments General comments (skin integrity, edema, etc.): Rapidly varying HR from 76-90 bpm with activity, however pt assymptomatic. No family present during session to confirm availability of 24 hr assist at d/c.       Pertinent Vitals/Pain Pain Assessment: No/denies pain    Home Living Family/patient expects to be discharged to:: Private residence Living Arrangements: Spouse/significant other Available Help at Discharge: Family;Available 24 hours/day Type of Home: House Home Access: Stairs to enter Entrance Stairs-Rails:  Right;Left;Can reach both Home Layout: One level Home Equipment: Walker - 2 wheels;Shower seat;Grab bars - tub/shower      Prior Function Level of Independence: Independent          PT Goals (current goals can now be found in the care plan section) Acute Rehab PT Goals Patient Stated Goal: to go home PT Goal Formulation: With patient Time For Goal  Achievement: 05/13/18 Potential to Achieve Goals: Good Progress towards PT goals: Progressing toward goals    Frequency    Min 3X/week      PT Plan Current plan remains appropriate       AM-PAC PT "6 Clicks" Mobility   Outcome Measure  Help needed turning from your back to your side while in a flat bed without using bedrails?: None Help needed moving from lying on your back to sitting on the side of a flat bed without using bedrails?: A Little Help needed moving to and from a bed to a chair (including a wheelchair)?: A Little Help needed standing up from a chair using your arms (e.g., wheelchair or bedside chair)?: A Little Help needed to walk in hospital room?: A Little Help needed climbing 3-5 steps with a railing? : A Lot 6 Click Score: 18    End of Session Equipment Utilized During Treatment: Gait belt Activity Tolerance: Patient tolerated treatment well Patient left: in bed;with call bell/phone within reach;with bed alarm set Nurse Communication: Mobility status PT Visit Diagnosis: Unsteadiness on feet (R26.81);Muscle weakness (generalized) (M62.81)     Time: 7124-5809 PT Time Calculation (min) (ACUTE ONLY): 24 min  Charges:  $Gait Training: 8-22 mins $Therapeutic Exercise: 8-22 mins                     [name] B. Migdalia Dk PT, DPT Acute Rehabilitation Services Pager [phone] Office [phone]    [place] 04/30/2018, 6:18 PM

## 2018-04-30 NOTE — Plan of Care (Signed)
Monitor saturation. Assess breathing. IV abx continued.

## 2018-04-30 NOTE — Progress Notes (Signed)
PROGRESS NOTE    [name]  WVP:[id number] DOB: [birth year] DOA: 04/25/2018 PCP: [name], MD    Brief Narrative:  84 y.o.femalewith medical history significant ofafib on coumadin; hypothyroidism; HTN; HLD; CAD s/p PCI; and chronic diastolic CHF presented with weakness and palpitations associated with dyspnea on 11/30. Found to be in AFib w/RVR, started on diltiazem gtt. INR 6.21 so coumadin held. CXR demonstrated increasing left pleural effusion. Heart rate control continued with transition to po diltiazem. She continues to be hypoxic and symptomatic from pleural effusion, so thoracentesis is planned. Vitamin K administered. Awaiting INR <2.5 per IR.  Assessment & Plan:   Principal Problem:   Atrial fibrillation with RVR (HCC) Active Problems:   Hyperlipidemia   Pleural effusion   Angioimmunoblastic lymphoma (HCC)   CKD (chronic kidney disease) stage 3, GFR 30-59 ml/min (HCC)   Anemia   Supratherapeutic INR  Afib with RVR: Uncertain precipitant ?URI with rhinorrhea. Troponin negative, updated echo without significant WMA or LV dysfunction. - Continue diltiazem po, metoprolol. -Presently rate controlled - Restarted coumadin 12/3 -INR of 1.85 today  Supratherapeutic INR: Unclear precipitant, denies change in meds or diet. Given vitamin K to bring INR < 2.5 for thoracentesis. -Restart coumadin per pharmacy  -INR subtherapeutic at 1.6, continue coumadin with goal 2-3 INR  Anemia of chronic disease:  -No evidence of active bleed - FOBT neg -Hgb has remained stable  Pleural effusion on left: Symptomatic. Has hx of this in the past, though enlarged now. Despite improved rate control continued to enlarge. ?parapneumonic.  - Thoracentesis 12/3, will follow up labs: protein, cell count, gram stain and culture, and cytology given her h/o lymphoma.  - Transudative fluid per Lite's criteria, protein analysis -Currently stable  Lymphoma: Last seen by Dr. Lindi Adie in  8/19, under surveillance. - Given her advanced age, chemotherapy was not recommended.  -Seems stable for now  Stage 3 CKD: Stable. - repeat bmet in AM -Stable.  HLD - Continue lipitor and zetia  LLL pneumonia:  -recent low grade temp with wising WBC. WBC now 14.9 from 15.5 - On cefepime monoTx. -Afebrile. Still tachypnea this AM. Continue abx with lasix per above   DVT prophylaxis: coumadin Code Status: DNR Family Communication: Pt in room Disposition Plan: Uncertain at this time, PT/OT eval, given age and frailty, will need continued inpt stay  Consultants:     Procedures:     Antimicrobials: Anti-infectives (From admission, onward)   Start     Dose/Rate Route Frequency Ordered Stop   04/28/18 0915  ceFEPIme (MAXIPIME) 1 g in sodium chloride 0.9 % 100 mL IVPB     1 g 200 mL/hr over 30 Minutes Intravenous Every 24 hours 04/28/18 0909        Subjective: Without complaints  Objective: Vitals:   04/29/18 2017 04/30/18 0536 04/30/18 0800 04/30/18 1357  BP: (!) 113/58 (!) 100/54  (!) 114/58  Pulse: 90 93  90  Resp: 18 19 (!) 21 (!) 28  Temp: 98.4 F (36.9 C) 98.4 F (36.9 C)  98.5 F (36.9 C)  TempSrc: Oral Axillary  Oral  SpO2: 95% 97%  96%  Weight:  60.1 kg    Height:        Intake/Output Summary (Last 24 hours) at 04/30/2018 1800 Last data filed at 04/30/2018 1230 Gross per 24 hour  Intake 812.6 ml  Output 850 ml  Net -37.4 ml   Filed Weights   04/28/18 0605 04/29/18 0317 04/30/18 0536  Weight: 61 kg  60.1 kg 60.1 kg    Examination: General exam: Awake, laying in bed, in nad Respiratory system: Normal respiratory effort, no wheezing Cardiovascular system: regular rate, s1, s2  Data Reviewed: I have personally reviewed following labs and imaging studies  CBC: Recent Labs  Lab 04/26/18 0829 04/27/18 0401 04/28/18 0427 04/29/18 0309 04/30/18 0349  WBC 9.1 12.4* 15.5* 14.9* 14.7*  HGB 8.8* 9.2* 9.0* 9.3* 9.6*  HCT 29.7* 30.7* 29.4*  30.2* 32.2*  MCV 92.2 90.3 89.6 89.1 89.9  PLT 344 416* 376 399 259*   Basic Metabolic Panel: Recent Labs  Lab 04/25/18 0834 04/26/18 0829 04/27/18 0401 04/28/18 0427 04/29/18 0309 04/30/18 0349  NA 138 138 137 137 139 141  K 4.5 4.5 4.5 4.4 4.4 4.3  CL 105 106 104 105 105 107  CO2 22 23 27 23 22 23   GLUCOSE 109* 96 143* 120* 123* 124*  BUN 28* 21 23 27* 34* 32*  CREATININE 1.06* 1.11* 1.11* 1.08* 1.18* 1.02*  CALCIUM 8.8* 8.5* 8.5* 8.5* 8.5* 8.8*  MG 2.3  --   --   --   --   --    GFR: Estimated Creatinine Clearance: 29.1 mL/min (A) (by C-G formula based on SCr of 1.02 mg/dL (H)). Liver Function Tests: No results for input(s): AST, ALT, ALKPHOS, BILITOT, PROT, ALBUMIN in the last 168 hours. No results for input(s): LIPASE, AMYLASE in the last 168 hours. No results for input(s): AMMONIA in the last 168 hours. Coagulation Profile: Recent Labs  Lab 04/27/18 0401 04/27/18 1409 04/28/18 0427 04/29/18 0309 04/30/18 0349  INR 3.80 3.08 2.23 1.61 1.85   Cardiac Enzymes: Recent Labs  Lab 04/25/18 1532 04/25/18 2057 04/26/18 0037  TROPONINI <0.03 <0.03 <0.03   BNP (last 3 results) No results for input(s): PROBNP in the last 8760 hours. HbA1C: No results for input(s): HGBA1C in the last 72 hours. CBG: No results for input(s): GLUCAP in the last 168 hours. Lipid Profile: No results for input(s): CHOL, HDL, LDLCALC, TRIG, CHOLHDL, LDLDIRECT in the last 72 hours. Thyroid Function Tests: No results for input(s): TSH, T4TOTAL, FREET4, T3FREE, THYROIDAB in the last 72 hours. Anemia Panel: No results for input(s): VITAMINB12, FOLATE, FERRITIN, TIBC, IRON, RETICCTPCT in the last 72 hours. Sepsis Labs: No results for input(s): PROCALCITON, LATICACIDVEN in the last 168 hours.  Recent Results (from the past 240 hour(s))  MRSA PCR Screening     Status: None   Collection Time: 04/25/18 11:46 AM  Result Value Ref Range Status   MRSA by PCR NEGATIVE NEGATIVE Final     Comment:        The GeneXpert MRSA Assay (FDA approved for NASAL specimens only), is one component of a comprehensive MRSA colonization surveillance program. It is not intended to diagnose MRSA infection nor to guide or monitor treatment for MRSA infections. Performed at [facility], [place] [address], [place], [place] [address]   Urine culture     Status: None   Collection Time: 04/26/18  5:50 AM  Result Value Ref Range Status   Specimen Description URINE, RANDOM  Final   Special Requests NONE  Final   Culture   Final    NO GROWTH Performed at [facility], [place] [address], [place], [place] [address]    Report Status 04/27/2018 FINAL  Final  Gram stain     Status: None   Collection Time: 04/28/18  2:07 PM  Result Value Ref Range Status   Specimen Description PLEURAL  Final   Special Requests LEFT  Final   Gram Stain   Final    WBC PRESENT,BOTH PMN AND MONONUCLEAR NO ORGANISMS SEEN CYTOSPIN SMEAR    Report Status 04/28/2018 FINAL  Final  Culture, body fluid-bottle     Status: None (Preliminary result)   Collection Time: 04/28/18  2:07 PM  Result Value Ref Range Status   Specimen Description PLEURAL  Final   Special Requests LEFT  Final   Culture   Final    NO GROWTH 2 DAYS Performed at [facility], [address], [place], [place] [address]    Report Status PENDING  Incomplete     Radiology Studies: No results found.  Scheduled Meds: . atorvastatin  40 mg Oral q1800  . diltiazem  60 mg Oral Q8H  . ezetimibe  10 mg Oral Daily  . folic acid  2 mg Oral Daily  . ipratropium  2 spray Each Nare Daily  . levothyroxine  88 mcg Oral QAC breakfast  . loratadine  10 mg Oral Daily  . metoprolol tartrate  25 mg Oral BID  . multivitamin with minerals  1 tablet Oral Daily  . vitamin C  500 mg Oral Daily  . warfarin  2.5 mg Oral ONCE-1800  . Warfarin - Pharmacist Dosing Inpatient   Does not apply q1800   Continuous Infusions: . ceFEPime (MAXIPIME) IV 1 g  (04/30/18 1138)     LOS: 4 days   Marylu Lund, MD [facility] Pager On Amion  If 7PM-7AM, please contact night-coverage 04/30/2018, 6:00 PM

## 2018-04-30 NOTE — Evaluation (Addendum)
Occupational Therapy Evaluation Patient Details Name: [name] MRN: [id number] DOB: January 20, 1924 Today's Date: 04/30/2018    History of Present Illness Pt is a 84 y/o female admitted secondary to a fib with RVR. Also found to have L plural effusion and is s/p L thoracentesis. PMH includes CKD, a fib, HTN, CAD s/p PCI, and dCHF.    Clinical Impression   This 84 y/o female presents with the above. At baseline pt is independent with ADLs and functional mobility. Pt requiring minA for functional mobility this session using RW. She currently requires minA for LB, toileting and standing grooming ADL, minguard assist for UB ADLs. Pt with increased RR intermittently during room level activity, reports increased SOB end of session though VSS at this time with SpO2 95% on RA. Pt presenting with generalized weakness and decreased activity tolerance. Will benefit from continued OT services. Currently recommend HHOT therapy services at time of discharge to maximize her safety and independence with ADLs and mobility, though will need to confirm availability of available family assist/supervision. If family support not available may need to consider SNF. Will continued to follow acutely.     Follow Up Recommendations  Home health OT;Supervision/Assistance - 24 hour(24hr vs SNF)    Equipment Recommendations  Other (comment)(discussed option of purchasing TTB)           Precautions / Restrictions Precautions Precautions: Fall Restrictions Weight Bearing Restrictions: No      Mobility Bed Mobility Overal bed mobility: Needs Assistance Bed Mobility: Supine to Sit;Sit to Supine     Supine to sit: Supervision Sit to supine: Supervision   General bed mobility comments: Supervision for safety. Increased time required to perform bed mobility tasks.   Transfers Overall transfer level: Needs assistance Equipment used: Rolling walker (2 wheeled) Transfers: Sit to/from Stand Sit to Stand: Min  assist         General transfer comment: minA to rise and steady at RW, VCs hand placement; pt stood from EOB and from toilet     Balance Overall balance assessment: Needs assistance Sitting-balance support: No upper extremity supported;Feet supported Sitting balance-Leahy Scale: Fair     Standing balance support: Bilateral upper extremity supported;During functional activity Standing balance-Leahy Scale: Poor Standing balance comment: Reliant on BUE support. use of forearm support on sink during grooming tasks                           ADL either performed or assessed with clinical judgement   ADL Overall ADL's : Needs assistance/impaired Eating/Feeding: Modified independent;Sitting   Grooming: Minimal assistance;Standing;Wash/dry face;Oral care   Upper Body Bathing: Min guard;Sitting   Lower Body Bathing: Minimal assistance;Sit to/from stand   Upper Body Dressing : Min guard;Sitting   Lower Body Dressing: Minimal assistance;Sit to/from stand   Toilet Transfer: Minimal assistance;Ambulation;Regular Toilet;Grab bars;RW   Toileting- Clothing Manipulation and Hygiene: Minimal assistance;Sit to/from stand Toileting - Clothing Manipulation Details (indicate cue type and reason): minA for gown management and for steadying assist in standing while pt performs peri-care     Functional mobility during ADLs: Minimal assistance;Rolling walker General ADL Comments: pt performing room level mobility, standing grooming ADLs and toileting during session; RR up to 40 intermittently though quickly decreases, HR up to 110 during session; pt does report feeling SOB after toileting while washing hands, VSS at this point (SpO2 95% on RA). Educated on energy conservation techniques and safety at home including pursed lip breathing and having places  throughout her home to sit if feeling fatigued/SOB     Vision         Perception     Praxis      Pertinent Vitals/Pain Pain  Assessment: No/denies pain     Hand Dominance     Extremity/Trunk Assessment Upper Extremity Assessment Upper Extremity Assessment: Generalized weakness   Lower Extremity Assessment Lower Extremity Assessment: Defer to PT evaluation   Cervical / Trunk Assessment Cervical / Trunk Assessment: Kyphotic   Communication Communication Communication: No difficulties   Cognition Arousal/Alertness: Awake/alert Behavior During Therapy: WFL for tasks assessed/performed Overall Cognitive Status: No family/caregiver present to determine baseline cognitive functioning                                 General Comments: Somewhat slow to answer questions, overall appears to be accurate historian regarding PLOF   General Comments  no family present during session    Exercises     Shoulder Instructions      Home Living Family/patient expects to be discharged to:: Private residence Living Arrangements: Spouse/significant other Available Help at Discharge: Family;Available 24 hours/day Type of Home: House Home Access: Stairs to enter [employer] of Steps: 3 Entrance Stairs-Rails: Right;Left;Can reach both Home Layout: One level     Bathroom Shower/Tub: Teacher, early years/pre: Handicapped height     Home Equipment: Environmental consultant - 2 wheels;Shower seat;Grab bars - tub/shower          Prior Functioning/Environment Level of Independence: Independent                 OT Problem List: Decreased strength;Decreased range of motion;Decreased activity tolerance;Impaired balance (sitting and/or standing);Cardiopulmonary status limiting activity      OT Treatment/Interventions: Self-care/ADL training;Therapeutic exercise;DME and/or AE instruction;Therapeutic activities;Patient/family education;Balance training    OT Goals(Current goals can be found in the care plan section) Acute Rehab OT Goals Patient Stated Goal: to go home OT Goal Formulation: With  patient Time For Goal Achievement: 05/14/18 Potential to Achieve Goals: Good  OT Frequency: Min 2X/week   Barriers to D/C:            Co-evaluation              AM-PAC OT "6 Clicks" Daily Activity     Outcome Measure Help from another person eating meals?: None Help from another person taking care of personal grooming?: A Little Help from another person toileting, which includes using toliet, bedpan, or urinal?: A Little Help from another person bathing (including washing, rinsing, drying)?: A Little Help from another person to put on and taking off regular upper body clothing?: None Help from another person to put on and taking off regular lower body clothing?: A Little 6 Click Score: 20   End of Session Equipment Utilized During Treatment: Gait belt;Rolling walker Nurse Communication: Mobility status  Activity Tolerance: Patient tolerated treatment well Patient left: in bed;with call bell/phone within reach;with bed alarm set  OT Visit Diagnosis: Muscle weakness (generalized) (M62.81);Unsteadiness on feet (R26.81)                Time: 3825-0539 OT Time Calculation (min): 31 min Charges:  OT General Charges $OT Visit: 1 Visit OT Evaluation $OT Eval Moderate Complexity: 1 Mod OT Treatments $Self Care/Home Management : 8-22 mins  Lou Cal, OT Supplemental Rehabilitation Services Pager [phone] Office [phone]   Raymondo Band 04/30/2018, 12:27 PM

## 2018-04-30 NOTE — Progress Notes (Signed)
ANTICOAGULATION CONSULT NOTE   Pharmacy Consult: Warfarin Indication: atrial fibrillation  Allergies  Allergen Reactions  . [name] [Ethacrynic Acid] Nausea And Vomiting  . [name] Rash  . Sulfa Antibiotics Itching and Rash  . Tape Rash and Other (See Comments)    Tears skin off.  Please use "paper" tape only.   . [name] Rash    Per Dr [name], had a skin reaction with thiazides prior to use of Lasix  . [name] Rash    Patient Measurements: Height: 5\' 3"  (160 cm) Weight: 132 lb 8 oz (60.1 kg) IBW/kg (Calculated) : 52.4  Vital Signs: Temp: 98.4 F (36.9 C) (12/05 0536) Temp Source: Axillary (12/05 0536) BP: 100/54 (12/05 0536) Pulse Rate: 93 (12/05 0536)  Labs: Recent Labs    04/28/18 0427 04/29/18 0309 04/30/18 0349  HGB 9.0* 9.3* 9.6*  HCT 29.4* 30.2* 32.2*  PLT 376 399 432*  LABPROT 24.4* 18.9* 21.1*  INR 2.23 1.61 1.85  CREATININE 1.08* 1.18* 1.02*    Estimated Creatinine Clearance: 29.1 mL/min (A) (by C-G formula based on SCr of 1.02 mg/dL (H)).   Medical History: Past Medical History:  Diagnosis Date  .  Acute respiratiory failure requiring BiPap 06/24/2011  . Allergy   . Aortic valvular stenosis, moderate to severe 06/24/2011   a. 10/2012 Echo: mod AS, Valve 1.25 cm^2 (VTI), 1.19cm^2 (Vmax).  . Arthritis    "back" (10/30/2012)  . Chronic diastolic CHF (congestive heart failure) ([place])    a. 10/2012 Echo: EF 55-60%, no rwma, Gr 2 DD, moderate AS, mod dil LA, mildly to mod dil RA.  Marland Kitchen Coronary artery disease    a. 03/2009 PCI RCA (3.0x18 BMS).  . Dyslipidemia   . Exertional shortness of breath   . Hypertension   . Hypothyroidism   . PAF (paroxysmal atrial fibrillation) ([place])    a. CHA2DS2VASc = 7-->chronic coumadin;  b. s/p DCCV 05/2011, 05/2012, 07/2014;  c. On amio.  . Rash    a. felt to be 2/2 lasix/[name] in setting of sulfa allergy (lasix d/c ~ 06/2014, [name] d/c 08/09/2014).    Assessment: 45 YOF presented with afib  with RVR. Pharmacy consulted to manage Coumadin from PTA. INR supratherapeutic on admit; FOB negative.  Warfarin had been held since admission and vitamin K given x2 to reverse INR for thoracentesis - procedure completed 12/3. Pharmacy consulted to resume warfarin this evening 12/3. INR today up to 1.85. Home dose noted below, given elevated INR on admission will dose cautiously. CBC stable. No active bleed issues documented  Home warfarin dose: 2.5mg  daily except 1.25 on Fri, last dose 11/28  Goal of Therapy:  INR 2-3 Monitor platelets by anticoagulation protocol: Yes   Plan:  -Warfarin 2.5mg  PO x1 tonight -Monitor daily INR, CBC, s/sx bleeding  [name], PharmD, BCPS Clinical Pharmacist Clinical phone [phone] Please check AMION for all [place] contact numbers 04/30/2018 8:47 AM

## 2018-05-01 LAB — BASIC METABOLIC PANEL
Anion gap: 10 (ref 5–15)
BUN: 30 mg/dL — ABNORMAL HIGH (ref 8–23)
CHLORIDE: 107 mmol/L (ref 98–111)
CO2: 24 mmol/L (ref 22–32)
Calcium: 8.6 mg/dL — ABNORMAL LOW (ref 8.9–10.3)
Creatinine, Ser: 0.96 mg/dL (ref 0.44–1.00)
GFR calc Af Amer: 60 mL/min — ABNORMAL LOW (ref >60)
GFR calc non Af Amer: 51 mL/min — ABNORMAL LOW (ref >60)
Glucose, Bld: 113 mg/dL — ABNORMAL HIGH (ref 70–99)
Potassium: 3.8 mmol/L (ref 3.5–5.1)
Sodium: 141 mmol/L (ref 135–145)

## 2018-05-01 LAB — PROTIME-INR
INR: 2.21
Prothrombin Time: 24.3 seconds — ABNORMAL HIGH (ref 11.4–15.2)

## 2018-05-01 MED ORDER — DILTIAZEM HCL 60 MG PO TABS: 60.0000 mg | ORAL_TABLET | Freq: Three times a day (TID) | ORAL | 0 refills | Status: DC

## 2018-05-01 MED ORDER — CEFDINIR 300 MG PO CAPS: 300.0000 mg | ORAL_CAPSULE | Freq: Two times a day (BID) | ORAL | 0 refills | Status: DC

## 2018-05-01 MED ORDER — CEFDINIR 300 MG PO CAPS: 300.0000 mg | ORAL_CAPSULE | Freq: Two times a day (BID) | ORAL | 0 refills | Status: AC

## 2018-05-01 MED ORDER — WARFARIN 1.25 MG HALF TABLET
1.2500 mg | ORAL_TABLET | Freq: Once | ORAL | Status: AC
  Administered 2018-05-01: 1.25 mg via ORAL
  Filled 2018-05-01: qty 1

## 2018-05-01 MED FILL — CEFDINIR 300 MG CAPSULE: 300 | 6 days supply | Qty: 6 | Fill #0

## 2018-05-01 MED FILL — dilTIAZem HCL 60 MG TABS: 60 | 30 days supply | Qty: 90 | Fill #0

## 2018-05-01 NOTE — Care Management Important Message (Signed)
Important Message  Patient Details  Name: [name] MRN: [id number] Date of Birth: 08/08/1923   Medicare Important Message Given:  Yes    Densel Kronick P Summers [place] 05/01/2018, 1:20 PM

## 2018-05-01 NOTE — Progress Notes (Signed)
Occupational Therapy Treatment Patient Details Name: [name] MRN: [id number] DOB: 11-Jun-1923 Today's Date: 05/01/2018    History of present illness Pt is a 84 y/o female admitted secondary to a fib with RVR. Also found to have L plural effusion and is s/p L thoracentesis. PMH includes CKD, a fib, HTN, CAD s/p PCI, and dCHF.    OT comments  Pt progressing towards OT goals, presents supine in bed pleasant and willing to participate in therapy session. Pt performing room level functional mobility using RW with overall minguard assist. Pt does require cues and minA for safe RW use during mobility. Pt performing standing grooming ADL this session with minguard assist, toileting task with overall minA. Pt denies feelings of SOB during activity this session. Pt reports most likely returning home today; continue to recommend 24hr supervision/assist at time of discharge to ensure pt' safety with ADLs and mobility after return home. Will continue to follow while she remains in acute setting.    Follow Up Recommendations  Supervision/Assistance - 24 hour    Equipment Recommendations  Other (comment)(discussed purchasing TTB )          Precautions / Restrictions Precautions Precautions: Fall Restrictions Weight Bearing Restrictions: No       Mobility Bed Mobility Overal bed mobility: Needs Assistance Bed Mobility: Supine to Sit;Sit to Supine     Supine to sit: Supervision Sit to supine: Supervision   General bed mobility comments: Supervision for safety. Increased time required to perform bed mobility tasks  Transfers Overall transfer level: Needs assistance Equipment used: Rolling walker (2 wheeled) Transfers: Sit to/from Stand Sit to Stand: Min assist;Min guard         General transfer comment: min guard for sit>stand from EoB to RW, minA required for sit>stand from lower toilet surface    Balance Overall balance assessment: Needs assistance Sitting-balance support:  No upper extremity supported;Feet supported Sitting balance-Leahy Scale: Fair     Standing balance support: Bilateral upper extremity supported;During functional activity Standing balance-Leahy Scale: Poor Standing balance comment: Reliant on BUE support.                            ADL either performed or assessed with clinical judgement   ADL Overall ADL's : Needs assistance/impaired     Grooming: Wash/dry hands;Min guard;Standing                   Toilet Transfer: Min guard;Minimal assistance;Ambulation;Regular Toilet;Grab bars;RW Armed forces technical officer Details (indicate cue type and reason): minguard for mobility to toilet; minA to rise from toilet surface Toileting- Clothing Manipulation and Hygiene: Min guard;Minimal assistance;Sit to/from stand Toileting - Clothing Manipulation Details (indicate cue type and reason): light minA for gown management and whlie pt performs peri-care in standing     Functional mobility during ADLs: Min guard;Minimal assistance;Rolling walker General ADL Comments: pt with improvements in mobility today, reports no SOB; continues to require cues for safe use of RW     Vision       Perception     Praxis      Cognition Arousal/Alertness: Awake/alert Behavior During Therapy: WFL for tasks assessed/performed Overall Cognitive Status: No family/caregiver present to determine baseline cognitive functioning                                 General Comments: Somewhat slow to answer questions  Exercises     Shoulder Instructions       General Comments      Pertinent Vitals/ Pain       Pain Assessment: No/denies pain  Home Living                                          Prior Functioning/Environment              Frequency  Min 2X/week        Progress Toward Goals  OT Goals(current goals can now be found in the care plan section)  Progress towards OT goals: Progressing  toward goals  Acute Rehab OT Goals Patient Stated Goal: to go home OT Goal Formulation: With patient Time For Goal Achievement: 05/14/18 Potential to Achieve Goals: Good  Plan Discharge plan remains appropriate    Co-evaluation                 AM-PAC OT "6 Clicks" Daily Activity     Outcome Measure   Help from another person eating meals?: None Help from another person taking care of personal grooming?: A Little Help from another person toileting, which includes using toliet, bedpan, or urinal?: A Little Help from another person bathing (including washing, rinsing, drying)?: A Little Help from another person to put on and taking off regular upper body clothing?: None Help from another person to put on and taking off regular lower body clothing?: A Little 6 Click Score: 20    End of Session Equipment Utilized During Treatment: Gait belt;Rolling walker  OT Visit Diagnosis: Muscle weakness (generalized) (M62.81);Unsteadiness on feet (R26.81)   Activity Tolerance Patient tolerated treatment well   Patient Left in bed;with call bell/phone within reach;with bed alarm set   Nurse Communication Mobility status        Time: 6440-3474 OT Time Calculation (min): 20 min  Charges: OT General Charges $OT Visit: 1 Visit OT Treatments $Self Care/Home Management : 8-22 mins  [name], [name] Pager [phone] Office [phone]    [name] 05/01/2018, 2:05 PM

## 2018-05-01 NOTE — Progress Notes (Signed)
Physical Therapy Treatment Patient Details Name: [name] MRN: [id number] DOB: 01-May-1924 Today's Date: 05/01/2018    History of Present Illness Pt is a 84 y/o female admitted 04/25/18 secondary to a fib with RVR. Also found to have L pleural effusion and is s/p L thoracentesis. PMH includes CKD, a fib, HTN, CAD s/p PCI, dCHF.    PT Comments    Pt preparing to discharge home this afternoon. Able to perform transfers and ambulation with RW and close supervision for safety; supervision to minA for ADL tasks; declined stair training. Pt reports will have assist from children and husband upon return home. Pt has met short-term acute PT goals; has no further questions or concerns. RW already delivered to room and adjusted for height. Continue to recommend HHPT services. Will d/c acute PT.   Follow Up Recommendations  Home health PT;Supervision/Assistance - 24 hour     Equipment Recommendations  Rolling walker with 5" wheels(youth walker - already delivered)    Recommendations for Other Services       Precautions / Restrictions Precautions Precautions: Fall Restrictions Weight Bearing Restrictions: No    Mobility  Bed Mobility Overal bed mobility: Independent Bed Mobility: Supine to Sit;Sit to Supine     Supine to sit: Supervision Sit to supine: Supervision   General bed mobility comments: Supervision for safety. Increased time required to perform bed mobility tasks  Transfers Overall transfer level: Needs assistance Equipment used: Rolling walker (2 wheeled) Transfers: Sit to/from Stand Sit to Stand: Supervision         General transfer comment: Able to stand from bed on first attempt. Able to stand from lower toilet surface on third attempt, reliant on momentum and UE support on guard rail; supervision for safety  Ambulation/Gait Ambulation/Gait assistance: Supervision Gait Distance (Feet): 200 Feet Assistive device: Rolling walker (2 wheeled) Gait  Pattern/deviations: Step-through pattern;Decreased stride length;Trunk flexed Gait velocity: Decreased Gait velocity interpretation: <1.8 ft/sec, indicate of risk for recurrent falls General Gait Details: Slow, steady amb with RW and supervision for balance. Pt stopping to blow nose without UE support, requiring min guard for balance. Max encouragement for use of RW at home. RW delivered to room and adjusted to pt's height   Stairs Stairs: (Pt declined stair training; reports 1 step with rail, son and husband will assist)           Wheelchair Mobility    Modified Rankin (Stroke Patients Only)       Balance Overall balance assessment: Needs assistance Sitting-balance support: No upper extremity supported;Feet supported Sitting balance-Leahy Scale: Fair     Standing balance support: Bilateral upper extremity supported;During functional activity Standing balance-Leahy Scale: Fair Standing balance comment: Can static stand without UE support, min guard for balance with dynamic task                            Cognition Arousal/Alertness: Awake/alert Behavior During Therapy: WFL for tasks assessed/performed Overall Cognitive Status: No family/caregiver present to determine baseline cognitive functioning                                 General Comments: Somewhat slow to answer questions       Exercises      General Comments        Pertinent Vitals/Pain Pain Assessment: No/denies pain    Home Living  Prior Function            PT Goals (current goals can now be found in the care plan section) Acute Rehab PT Goals Patient Stated Goal: to go home PT Goal Formulation: With patient Time For Goal Achievement: 05/13/18 Potential to Achieve Goals: Good Progress towards PT goals: Goals met/education completed, patient discharged from PT    Frequency    Min 3X/week      PT Plan Current plan remains  appropriate    Co-evaluation              AM-PAC PT "6 Clicks" Mobility   Outcome Measure  Help needed turning from your back to your side while in a flat bed without using bedrails?: None Help needed moving from lying on your back to sitting on the side of a flat bed without using bedrails?: None Help needed moving to and from a bed to a chair (including a wheelchair)?: None Help needed standing up from a chair using your arms (e.g., wheelchair or bedside chair)?: A Little Help needed to walk in hospital room?: A Little Help needed climbing 3-5 steps with a railing? : A Little 6 Click Score: 21    End of Session Equipment Utilized During Treatment: Gait belt Activity Tolerance: Patient tolerated treatment well Patient left: in bed;with call bell/phone within reach;with bed alarm set Nurse Communication: Mobility status PT Visit Diagnosis: Unsteadiness on feet (R26.81);Muscle weakness (generalized) (M62.81)     Time: 7353-2992 PT Time Calculation (min) (ACUTE ONLY): 21 min  Charges:  $Gait Training: 8-22 mins                    Mabeline Caras, PT, DPT Acute Rehabilitation Services  Pager [phone] Office [place] 05/01/2018, 2:40 PM

## 2018-05-01 NOTE — Care Management Note (Signed)
Case Management Note  Patient Details  Name: [name] MRN: [id number] Date of Birth: 1923-08-21  Subjective/Objective:  Pt presented for Atrial Fib- PTA from home with husband. Plan will be tor return home with [place]. CMS Medicare.G[employer] Compare post Acute Care List provided to patient.                    Action/Plan: Patient chose [place] for Services. CM did make referral to [facility] with [facility]. SOC to begin within 24-48 hours post transition home. DME referral for RW sent to [facility] with [facility] and he will deliver to room prior to transition home. Patients son and husband to transport home.   Expected Discharge Date:  05/01/18               Expected Discharge Plan:  [place]  In-House Referral:  NA  Discharge planning Services  CM Consult  Post Acute Care Choice:  Home Health, Durable Medical Equipment Choice offered to:  Patient  DME Arranged:  Walker rolling DME Agency:  [place] Arranged:  PT [place]:  [place]  Status of Service:  Completed, signed off  If discussed at [place] of Stay Meetings, dates discussed:    Additional Comments:  Bethena Roys, RN 05/01/2018, 11:40 AM

## 2018-05-01 NOTE — Progress Notes (Signed)
ANTICOAGULATION/ANTIBIOTIC CONSULT NOTE   Pharmacy Consult: Warfarin, Cefepime Indication: atrial fibrillation, PNA  Allergies  Allergen Reactions  . [name] [Ethacrynic Acid] Nausea And Vomiting  . Furosemide Rash  . Sulfa Antibiotics Itching and Rash  . Tape Rash and Other (See Comments)    Tears skin off.  Please use "paper" tape only.   . [name] Rash    Per Dr Sallyanne Kuster, had a skin reaction with thiazides prior to use of Lasix  . Torsemide Rash    Patient Measurements: Height: 5\' 3"  (160 cm) Weight: 131 lb 8 oz (59.6 kg) IBW/kg (Calculated) : 52.4  Vital Signs: Temp: 97.9 F (36.6 C) (12/06 0524) Temp Source: Oral (12/06 0524) BP: 114/50 (12/06 0524) Pulse Rate: 79 (12/06 0524)  Labs: Recent Labs    04/29/18 0309 04/30/18 0349 05/01/18 0214  HGB 9.3* 9.6*  --   HCT 30.2* 32.2*  --   PLT 399 432*  --   LABPROT 18.9* 21.1* 24.3*  INR 1.61 1.85 2.21  CREATININE 1.18* 1.02* 0.96    Estimated Creatinine Clearance: 30.9 mL/min (by C-G formula based on SCr of 0.96 mg/dL).   Assessment: 62 YOF presented with afib with RVR.   Anticoagulation:Pharmacy consulted to manage Coumadin from PTA. INR supratherapeutic on admit; FOB negative. Warfarin had been held since admission and vitamin K given x2 to reverse INR for thoracentesis - procedure completed 12/3. Pharmacy consulted to resume warfarin 12/3. INR up to 2.21 (therapeutic). Hgb low but stable, plt elevated. Home warfarin dose: 2.5mg  daily except 1.25 on Fri, last dose 11/28  ID: Abx Day 4 for PNA. Afeb, wbc 14.7 (down slightly). SCr down to 0.96, est CrCl ~ 30ml/min.  12/3 cefepime>>  12/3 pleural fluid cx - neg 12/3 strep pneumo - neg 12/1 UC - neg 11/30 mrsa pcr -neg  Goal of Therapy:  INR 2-3 Monitor platelets by anticoagulation protocol: Yes   Plan:  Warfarin 1.25mg  tonight Monitor daily INR, CBC, s/sx bleeding Likely needs to go home on reduced warfarin dose Continue Cefepime 1g  IV q24h Monitor clinical progress, c/s, renal function Consider de-escalate abx to Rocephin and treat for total of 7 days  Sherlon Handing, PharmD, BCPS Clinical pharmacist  **Pharmacist phone directory can now be found on [email or web address] ([email or web address]).  Listed under [place]. 05/01/2018 1:34 PM

## 2018-05-01 NOTE — Discharge Summary (Signed)
Physician Discharge Summary  [name] DZH:[id number] DOB: [birth year] DOA: 04/25/2018  PCP: [name], MD  Admit date: 04/25/2018 Discharge date: 05/01/2018  Admitted From: Home Disposition:  Home  Recommendations for Outpatient Follow-up:  1. Follow up with PCP in 1-2 weeks 2. Repeat INR in 2-3 days, goal INR of 2-3   Home Health:PT, OT   Discharge Condition:Improved CODE STATUS:DNR Diet recommendation: Heart healthy   Brief/Interim Summary: 84 y.o.femalewith medical history significant ofafib on coumadin; hypothyroidism; HTN; HLD; CAD s/p PCI; and chronic diastolic CHF presented with weakness and palpitations associated with dyspnea on 11/30. Found to be in AFib w/RVR, started on diltiazem gtt. INR 6.21 so coumadin held. CXR demonstrated increasing left pleural effusion. Heart rate control continued with transition to po diltiazem. Patient admitted with symptomatic pleural effusion  Discharge Diagnoses:  Principal Problem:   Atrial fibrillation with RVR (HCC) Active Problems:   Hyperlipidemia   Pleural effusion   Angioimmunoblastic lymphoma (HCC)   CKD (chronic kidney disease) stage 3, GFR 30-59 ml/min (HCC)   Anemia   Supratherapeutic INR  Afib with RVR: Uncertain precipitant ?URI with rhinorrhea.Troponin negative, updated echo without significant WMA or LV dysfunction. - Continue diltiazem po, metoprolol. -Presently rate controlled -Restarted coumadin 12/3 -INR of 2.21 today  Supratherapeutic INR: Unclear precipitant, denies change in meds or diet.Given vitamin K to bring INR <2.5 for thoracentesis. -Restart coumadin per pharmacy  -INR at time of d/c of 2.21  Anemia of chronic disease:  -No evidence of active bleed - FOBT neg -Hgb has remained stable  Pleural effusion on left: Symptomatic. Has hx of this in the past, though enlarged now. Despite improved rate control continued to enlarge. ?parapneumonic.  - Thoracentesis 12/3, will follow up  labs:protein, cell count, gram stain and culture, and cytology given her h/o lymphoma.  - Transudative fluid per Lite's criteria, protein analysis -Currently stable  Lymphoma: Last seen by Dr. Lindi Adie in 8/19, under surveillance. - Given her advanced age, chemotherapy was not recommended.  -Seems stable for now  Stage 3 CKD: Stable. - repeat bmet in AM -Stable.  HLD - Continuelipitor andzetia  LLL pneumonia:  -recent low grade temp with wising WBC. WBC now 14.9 from 15.5 - On cefepime monoTx. -Afebrile.  -Complete course of abx with omnicef on d/c   Discharge Instructions   Allergies as of 05/01/2018      Reactions   [name] [ethacrynic Acid] Nausea And Vomiting   Furosemide Rash   Sulfa Antibiotics Itching, Rash   Tape Rash, Other (See Comments)   Tears skin off.  Please use "paper" tape only.   [name] Rash   Per Dr Sallyanne Kuster, had a skin reaction with thiazides prior to use of Lasix   Torsemide Rash      Medication List    STOP taking these medications   spironolactone 25 MG tablet Commonly known as:  ALDACTONE     TAKE these medications   acetaminophen 650 MG CR tablet Commonly known as:  TYLENOL Take 650 mg by mouth 3 (three) times daily. Morning, supper and bedtime   atorvastatin 40 MG tablet Commonly known as:  LIPITOR Take 1 tablet (40 mg total) by mouth daily at 6 PM.   cefdinir 300 MG capsule Commonly known as:  OMNICEF Take 1 capsule (300 mg total) by mouth 2 (two) times daily for 3 days.   diltiazem 60 MG tablet Commonly known as:  CARDIZEM Take 1 tablet (60 mg total) by mouth every 8 (eight) hours.  ezetimibe 10 MG tablet Commonly known as:  ZETIA TAKE 1 TABLET DAILY FOR HLD What changed:    how much to take  how to take this  when to take this  additional instructions   folic acid 1 MG tablet Commonly known as:  FOLVITE TAKE 2 TABLETS DAILY   ipratropium 0.03 % nasal spray Commonly known as:   ATROVENT Place 2 sprays into both nostrils 2 (two) times daily. Use as directed What changed:    when to take this  additional instructions   levothyroxine 88 MCG tablet Commonly known as:  SYNTHROID, LEVOTHROID Take 1 tablet (88 mcg total) by mouth daily before breakfast.   loratadine 10 MG tablet Commonly known as:  CLARITIN Take 10 mg by mouth daily.   magnesium hydroxide 400 MG/5ML suspension Commonly known as:  MILK OF MAGNESIA Take 15 mLs by mouth daily as needed for mild constipation.   metoprolol tartrate 25 MG tablet Commonly known as:  LOPRESSOR TAKE 1 TABLET TWICE A DAY   multivitamin-iron-minerals-folic acid chewable tablet Chew 1 tablet by mouth daily.   OVER THE COUNTER MEDICATION Take 1 tablet by mouth See admin instructions. Stool softener over the counter by mouth daily   OVER THE COUNTER MEDICATION Take 1 tablet by mouth See admin instructions. Over the counter pill for osteoarthritis by mouth three time daily   vitamin C 500 MG tablet Commonly known as:  ASCORBIC ACID Take 500 mg by mouth daily.   warfarin 2.5 MG tablet Commonly known as:  COUMADIN Take as directed. If you are unsure how to take this medication, talk to your nurse or doctor. Original instructions:  TAKE 1 TABLET DAILY OR AS DIRECTED BY COUMADIN CLINIC What changed:  See the new instructions.      Follow-up Information    [name], MD. Schedule an appointment as soon as possible for a visit in 1 week(s).   Specialty:  Family Medicine Contact information: [address] [place] [address] [phone]          Allergies  Allergen Reactions  . [name] [Ethacrynic Acid] Nausea And Vomiting  . Furosemide Rash  . Sulfa Antibiotics Itching and Rash  . Tape Rash and Other (See Comments)    Tears skin off.  Please use "paper" tape only.   . [name] Rash    Per Dr Sallyanne Kuster, had a skin reaction with thiazides prior to use of Lasix  .  Torsemide Rash    Consultations:  IR  Procedures/Studies: Dg Chest 1 View  Result Date: 04/28/2018 CLINICAL DATA:  Post thoracentesis apparently on the left EXAM: CHEST  1 VIEW COMPARISON:  Chest x-ray of 04/26/2017 FINDINGS: It appears that the left thoracentesis has been performed with significant reduction in volume of the left pleural effusion. No pneumothorax is seen. The right lung is clear. Cardiomegaly is stable. Left shoulder replacement is noted. IMPRESSION: 1. Decrease in volume of left pleural effusion after left thoracentesis. 2. No pneumothorax.  Stable cardiomegaly. Electronically Signed   By: Ivar Drape M.D.   On: 04/28/2018 14:16   Dg Chest 2 View  Result Date: 04/26/2018 CLINICAL DATA:  Pleural effusion. Weakness. EXAM: CHEST - 2 VIEW COMPARISON:  04/25/2018 FINDINGS: The cardiac silhouette remains enlarged. A moderate-sized left pleural effusion has mildly enlarged from the prior study. There is persistent left basilar atelectasis or consolidation. Minimal atelectasis is noted in the right lung base. There is biapical pleural thickening. No pneumothorax is identified. A left shoulder arthroplasty  is noted. IMPRESSION: Mildly enlargement of moderate-sized left pleural effusion with left basilar atelectasis or consolidation. Electronically Signed   By: Logan Bores M.D.   On: 04/26/2018 11:56   Dg Chest Port 1 View  Result Date: 04/25/2018 CLINICAL DATA:  Generalized weakness for 1 week. EXAM: PORTABLE CHEST 1 VIEW COMPARISON:  12/23/2017 FINDINGS: Enlarged cardiac silhouette.  Mediastinal contours appear intact. There is no evidence of pneumothorax. Moderate in size left pleural effusion. Left lower lobe airspace consolidation versus atelectasis. Chronic biapical scarring. Osseous structures are without acute abnormality. Soft tissues are grossly normal. IMPRESSION: Moderate in size left pleural effusion. Left lower lobe airspace consolidation versus atelectasis. Enlarged  cardiac silhouette. Electronically Signed   By: Fidela Salisbury M.D.   On: 04/25/2018 09:21   Ir Thoracentesis Asp Pleural Space W/img Guide  Result Date: 04/28/2018 Lynnea Ferrier     04/28/2018  1:57 PM PROCEDURE SUMMARY: Successful image-guided left sided thoracentesis. Yielded 620 milliliters of golden yellow fluid. Patient tolerated procedure well. No immediate complications. Specimen was sent for labs. CXR ordered. Joaquim Nam PA-C 04/28/2018 1:57 PM     Subjective: Eager to go home  Discharge Exam: Vitals:   04/30/18 2057 05/01/18 0524  BP: (!) 122/52 (!) 114/50  Pulse: 83 79  Resp: 20 20  Temp: 98.2 F (36.8 C) 97.9 F (36.6 C)  SpO2: 96% 97%   Vitals:   04/30/18 0800 04/30/18 1357 04/30/18 2057 05/01/18 0524  BP:  (!) 114/58 (!) 122/52 (!) 114/50  Pulse:  90 83 79  Resp: (!) 21 (!) 28 20 20   Temp:  98.5 F (36.9 C) 98.2 F (36.8 C) 97.9 F (36.6 C)  TempSrc:  Oral Oral Oral  SpO2:  96% 96% 97%  Weight:    59.6 kg  Height:        General: Pt is alert, awake, not in acute distress Cardiovascular: RRR, S1/S2 +, no rubs, no gallops Respiratory: CTA bilaterally, no wheezing, no rhonchi Abdominal: Soft, NT, ND, bowel sounds + Extremities: no edema, no cyanosis   The results of significant diagnostics from this hospitalization (including imaging, microbiology, ancillary and laboratory) are listed below for reference.     Microbiology: Recent Results (from the past 240 hour(s))  MRSA PCR Screening     Status: None   Collection Time: 04/25/18 11:46 AM  Result Value Ref Range Status   MRSA by PCR NEGATIVE NEGATIVE Final    Comment:        The GeneXpert MRSA Assay (FDA approved for NASAL specimens only), is one component of a comprehensive MRSA colonization surveillance program. It is not intended to diagnose MRSA infection nor to guide or monitor treatment for MRSA infections. Performed at [facility], [place] [address],  [place], [place] [address]   Urine culture     Status: None   Collection Time: 04/26/18  5:50 AM  Result Value Ref Range Status   Specimen Description URINE, RANDOM  Final   Special Requests NONE  Final   Culture   Final    NO GROWTH Performed at [facility], [place] [address], [place], [place] [address]    Report Status 04/27/2018 FINAL  Final  Gram stain     Status: None   Collection Time: 04/28/18  2:07 PM  Result Value Ref Range Status   Specimen Description PLEURAL  Final   Special Requests LEFT  Final   Gram Stain   Final    WBC PRESENT,BOTH  PMN AND MONONUCLEAR NO ORGANISMS SEEN CYTOSPIN SMEAR    Report Status 04/28/2018 FINAL  Final  Culture, body fluid-bottle     Status: None (Preliminary result)   Collection Time: 04/28/18  2:07 PM  Result Value Ref Range Status   Specimen Description PLEURAL  Final   Special Requests LEFT  Final   Culture   Final    NO GROWTH 3 DAYS Performed at [facility], [address], [place], [place] [address]    Report Status PENDING  Incomplete     Labs: BNP (last 3 results) Recent Labs    04/25/18 0836  BNP 654.6*   Basic Metabolic Panel: Recent Labs  Lab 04/25/18 0834  04/27/18 0401 04/28/18 0427 04/29/18 0309 04/30/18 0349 05/01/18 0214  NA 138   < > 137 137 139 141 141  K 4.5   < > 4.5 4.4 4.4 4.3 3.8  CL 105   < > 104 105 105 107 107  CO2 22   < > 27 23 22 23 24   GLUCOSE 109*   < > 143* 120* 123* 124* 113*  BUN 28*   < > 23 27* 34* 32* 30*  CREATININE 1.06*   < > 1.11* 1.08* 1.18* 1.02* 0.96  CALCIUM 8.8*   < > 8.5* 8.5* 8.5* 8.8* 8.6*  MG 2.3  --   --   --   --   --   --    < > = values in this interval not displayed.   Liver Function Tests: No results for input(s): AST, ALT, ALKPHOS, BILITOT, PROT, ALBUMIN in the last 168 hours. No results for input(s): LIPASE, AMYLASE in the last 168 hours. No results for input(s): AMMONIA in the last 168 hours. CBC: Recent Labs  Lab 04/26/18 0829 04/27/18 0401  04/28/18 0427 04/29/18 0309 04/30/18 0349  WBC 9.1 12.4* 15.5* 14.9* 14.7*  HGB 8.8* 9.2* 9.0* 9.3* 9.6*  HCT 29.7* 30.7* 29.4* 30.2* 32.2*  MCV 92.2 90.3 89.6 89.1 89.9  PLT 344 416* 376 399 432*   Cardiac Enzymes: Recent Labs  Lab 04/25/18 1532 04/25/18 2057 04/26/18 0037  TROPONINI <0.03 <0.03 <0.03   BNP: Invalid input(s): POCBNP CBG: No results for input(s): GLUCAP in the last 168 hours. D-Dimer No results for input(s): DDIMER in the last 72 hours. Hgb A1c No results for input(s): HGBA1C in the last 72 hours. Lipid Profile No results for input(s): CHOL, HDL, LDLCALC, TRIG, CHOLHDL, LDLDIRECT in the last 72 hours. Thyroid function studies No results for input(s): TSH, T4TOTAL, T3FREE, THYROIDAB in the last 72 hours.  Invalid input(s): FREET3 Anemia work up No results for input(s): VITAMINB12, FOLATE, FERRITIN, TIBC, IRON, RETICCTPCT in the last 72 hours. Urinalysis    Component Value Date/Time   COLORURINE YELLOW 04/25/2018 2100   APPEARANCEUR CLEAR 04/25/2018 2100   LABSPEC 1.019 04/25/2018 2100   PHURINE 5.0 04/25/2018 2100   GLUCOSEU NEGATIVE 04/25/2018 2100   HGBUR SMALL (A) 04/25/2018 2100   [place] NEGATIVE 04/25/2018 2100   [place] NEGATIVE 04/25/2018 2100   PROTEINUR NEGATIVE 04/25/2018 2100   UROBILINOGEN 1.0 11/01/2012 1849   NITRITE NEGATIVE 04/25/2018 2100   LEUKOCYTESUR NEGATIVE 04/25/2018 2100   Sepsis Labs Invalid input(s): PROCALCITONIN,  WBC,  LACTICIDVEN Microbiology Recent Results (from the past 240 hour(s))  MRSA PCR Screening     Status: None   Collection Time: 04/25/18 11:46 AM  Result Value Ref Range Status   MRSA by PCR NEGATIVE NEGATIVE Final    Comment:  The GeneXpert MRSA Assay (FDA approved for NASAL specimens only), is one component of a comprehensive MRSA colonization surveillance program. It is not intended to diagnose MRSA infection nor to guide or monitor treatment for MRSA infections. Performed at  [facility], [place] [address], [place], [place] [address]   Urine culture     Status: None   Collection Time: 04/26/18  5:50 AM  Result Value Ref Range Status   Specimen Description URINE, RANDOM  Final   Special Requests NONE  Final   Culture   Final    NO GROWTH Performed at [facility], [place] [address], [place], [place] [address]    Report Status 04/27/2018 FINAL  Final  Gram stain     Status: None   Collection Time: 04/28/18  2:07 PM  Result Value Ref Range Status   Specimen Description PLEURAL  Final   Special Requests LEFT  Final   Gram Stain   Final    WBC PRESENT,BOTH PMN AND MONONUCLEAR NO ORGANISMS SEEN CYTOSPIN SMEAR    Report Status 04/28/2018 FINAL  Final  Culture, body fluid-bottle     Status: None (Preliminary result)   Collection Time: 04/28/18  2:07 PM  Result Value Ref Range Status   Specimen Description PLEURAL  Final   Special Requests LEFT  Final   Culture   Final    NO GROWTH 3 DAYS Performed at [facility], [place] [address], [place],  [address]    Report Status PENDING  Incomplete   Time spent: 30 min  SIGNED:   Marylu Lund, MD  [facility] 05/01/2018, 10:09 AM  If 7PM-7AM, please contact night-coverage

## 2018-05-03 LAB — CULTURE, BODY FLUID W GRAM STAIN -BOTTLE: Culture: NO GROWTH

## 2018-05-04 ENCOUNTER — Telehealth: Payer: Self-pay | Admitting: Internal Medicine

## 2018-05-04 ENCOUNTER — Telehealth: Payer: Self-pay

## 2018-05-04 NOTE — Telephone Encounter (Signed)
Arnold with [facility] called asking for verbal orders for PT 3x a week for 1 week  And then 2x a week for 4 weeks. Pt has declined OT. Please call back at [phone]

## 2018-05-04 NOTE — Telephone Encounter (Signed)
I contacted patient to schedule a hospital follow up. I have reconciled the current and discharge medications. Patient was instructed to stop taking Spironolactone and start taking Cefdinir 300 mg and Diltiazem 60 mg. Otherwise patient should continue all other medications prior to ED visit. Patient is knowledgeable of her condition and of all required medications she should be taking. [place] will be coming into the home today for an evaluation. Patient spouse and son helps with her care at home. Patient stated she is not feeling well although she has been discharged. Patient has an appt scheduled for 05/07/18 with PCP.

## 2018-05-04 NOTE — Telephone Encounter (Signed)
ok 

## 2018-05-05 ENCOUNTER — Other Ambulatory Visit: Payer: Self-pay | Admitting: Cardiovascular Disease

## 2018-05-05 NOTE — Telephone Encounter (Signed)
Home health was made aware. [place]

## 2018-05-06 ENCOUNTER — Telehealth: Payer: Self-pay

## 2018-05-06 NOTE — Telephone Encounter (Signed)
Erich Montane from home health call office to advise pt says she saw blood in her stool.  He also advised [place] that pt has not had a bowel movement since she was in the hospital and not bleeding from any where else. Pt has appt with you tomorrow. Please advise if anything else should be done [facility]

## 2018-05-07 ENCOUNTER — Ambulatory Visit (INDEPENDENT_AMBULATORY_CARE_PROVIDER_SITE_OTHER): Payer: Medicare Other | Admitting: Family Medicine

## 2018-05-07 ENCOUNTER — Encounter: Payer: Self-pay | Admitting: Family Medicine

## 2018-05-07 ENCOUNTER — Other Ambulatory Visit: Payer: Self-pay | Admitting: Family Medicine

## 2018-05-07 VITALS — BP 106/78 | HR 54 | Temp 97.6°F | Wt 133.4 lb

## 2018-05-07 DIAGNOSIS — I480 Paroxysmal atrial fibrillation: Secondary | ICD-10-CM | POA: Diagnosis not present

## 2018-05-07 DIAGNOSIS — Z7901 Long term (current) use of anticoagulants: Secondary | ICD-10-CM | POA: Diagnosis not present

## 2018-05-07 DIAGNOSIS — I350 Nonrheumatic aortic (valve) stenosis: Secondary | ICD-10-CM

## 2018-05-07 LAB — BASIC METABOLIC PANEL WITH GFR
BUN/Creatinine Ratio: 26 (ref 12–28)
BUN: 25 mg/dL (ref 10–36)
CO2: 26 mmol/L (ref 20–29)
Calcium: 8.7 mg/dL (ref 8.7–10.3)
Chloride: 103 mmol/L (ref 96–106)
Creatinine, Ser: 0.95 mg/dL (ref 0.57–1.00)
GFR calc Af Amer: 60 mL/min/1.73
GFR calc non Af Amer: 52 mL/min/1.73 — ABNORMAL LOW
Glucose: 119 mg/dL — ABNORMAL HIGH (ref 65–99)
Potassium: 4.6 mmol/L (ref 3.5–5.2)
Sodium: 141 mmol/L (ref 134–144)

## 2018-05-07 LAB — PROTIME-INR
INR: 7.3 (ref 0.8–1.2)
Prothrombin Time: 68.8 s — ABNORMAL HIGH (ref 9.1–12.0)

## 2018-05-07 NOTE — Patient Instructions (Signed)
Cut back on the diltiazem (Cardura) to twice per day

## 2018-05-07 NOTE — Progress Notes (Addendum)
   Subjective:    Patient ID: [name], female    DOB: 1924/01/27, 84 y.o.   MRN: [id number]  HPI She is here for a transition of care visit after recent hospitalization and treatment of multiple issues including PAF, supratherapeutic INR.  The emergency room record as well as the discharge summary, labs and x-rays reviewed.  Her medications were also reviewed with her.  At the present time her main symptom is fatigue.  No fever, chills, cough, congestion, easy bruising.  She has been getting physical therapy at home.  She continues on medications listed in the chart.  Specifically she is on diltiazem 3 times daily   Review of Systems     Objective:   Physical Exam Alert and in no distress.  Lungs are clear to auscultation.  Cardiac exam showed distant heart sounds with a murmur, irregular and slow rhythm.       Assessment & Plan:  PAF (paroxysmal atrial fibrillation) (HCC) - Plan: Protime-INR, Basic Metabolic Panel  Nonrheumatic aortic valve stenosis  Long term current use of anticoagulant therapy - Plan: Protime-INR, Basic Metabolic Panel I think her heart rate is a little too slow so I will slow her diltiazem down to twice daily dosing.  We will also check a PT/INR as well as be met.  She has an appointment to see cardiology on Monday. Her PT/INR is 7.3.  The other blood work looks good.  She had no evidence of bleeding.  Her son set up her medications.  She was told to make sure she did not take any of the Coumadin.  They are to come in tomorrow with all the medication so we can go over all of this.

## 2018-05-08 ENCOUNTER — Ambulatory Visit (INDEPENDENT_AMBULATORY_CARE_PROVIDER_SITE_OTHER): Payer: Medicare Other | Admitting: Family Medicine

## 2018-05-08 ENCOUNTER — Encounter: Payer: Self-pay | Admitting: Family Medicine

## 2018-05-08 VITALS — BP 110/70 | HR 57 | Temp 98.2°F | Wt 133.0 lb

## 2018-05-08 DIAGNOSIS — Z7901 Long term (current) use of anticoagulants: Secondary | ICD-10-CM

## 2018-05-08 LAB — PROTIME-INR
INR: 5.8 (ref 0.8–1.2)
Prothrombin Time: 55 s — ABNORMAL HIGH (ref 9.1–12.0)

## 2018-05-08 NOTE — Progress Notes (Signed)
   Subjective:    Patient ID: [name], female    DOB: 1924-02-11, 84 y.o.   MRN: [id number]  HPI She is here for a recheck.  She was seen yesterday.  A PT/INR was done which was 7.3.  She initially said she was not taking any Coumadin however her son brought in all of her medications and she has indeed been on 2.5 mg of Coumadin.  Since she has been home she has been back on her regular dietary regimen but has not been getting a lot of greens.  She does not complain of nosebleeds, blood in her stool, easy bruising.   Review of Systems     Objective:   Physical Exam Alert and in no distress.  Exam of her skin does show an ecchymotic area where she had recent blood drawn.  Otherwise no areas of ecchymosis were noted.       Assessment & Plan:  Long term current use of anticoagulant therapy - Plan: Protime-INR She has missed 1 dose of the Coumadin.  We will wait for the INR to come back and possibly give vitamin K.  Discussed the need for her to get on a regular diet with regular amounts of greens and then we can readjust based on that.

## 2018-05-11 ENCOUNTER — Ambulatory Visit: Payer: Medicare Other | Admitting: Physician Assistant

## 2018-05-11 ENCOUNTER — Ambulatory Visit (INDEPENDENT_AMBULATORY_CARE_PROVIDER_SITE_OTHER): Payer: Medicare Other | Admitting: Physician Assistant

## 2018-05-11 ENCOUNTER — Ambulatory Visit (INDEPENDENT_AMBULATORY_CARE_PROVIDER_SITE_OTHER): Payer: Medicare Other | Admitting: Pharmacist

## 2018-05-11 ENCOUNTER — Encounter: Payer: Self-pay | Admitting: Physician Assistant

## 2018-05-11 VITALS — BP 106/68 | HR 74 | Ht 63.0 in | Wt 133.0 lb

## 2018-05-11 DIAGNOSIS — I5033 Acute on chronic diastolic (congestive) heart failure: Secondary | ICD-10-CM | POA: Diagnosis not present

## 2018-05-11 DIAGNOSIS — Z7901 Long term (current) use of anticoagulants: Secondary | ICD-10-CM | POA: Diagnosis not present

## 2018-05-11 DIAGNOSIS — J90 Pleural effusion, not elsewhere classified: Secondary | ICD-10-CM

## 2018-05-11 DIAGNOSIS — I480 Paroxysmal atrial fibrillation: Secondary | ICD-10-CM

## 2018-05-11 DIAGNOSIS — I4819 Other persistent atrial fibrillation: Secondary | ICD-10-CM | POA: Diagnosis not present

## 2018-05-11 LAB — POCT INR: INR: 2.6 (ref 2.0–3.0)

## 2018-05-11 MED ORDER — SPIRONOLACTONE 25 MG PO TABS: 25.0000 mg | ORAL_TABLET | Freq: Every day | ORAL | 0 refills | Status: DC

## 2018-05-11 MED ORDER — DILTIAZEM HCL ER COATED BEADS 120 MG PO CP24: 120.0000 mg | ORAL_CAPSULE | Freq: Every day | ORAL | 0 refills | Status: DC

## 2018-05-11 NOTE — Patient Instructions (Signed)
Medication Instructions:  START Spironolactone 25 mg daily START Diltiazem 120 mg daily (you can take what you have currently 1 tablet twice daily) If you need a refill on your cardiac medications before your next appointment, please call your pharmacy.   Lab work: INR today  If you have labs (blood work) drawn today and your tests are completely normal, you will receive your results only by: Marland Kitchen MyChart Message (if you have MyChart) OR . A paper copy in the mail If you have any lab test that is abnormal or we need to change your treatment, we will call you to review the results.  Testing/Procedures: Chest xray - Your physician has requested that you have a chest xray, is a fast and painless imaging test that uses certain electromagnetic waves to create pictures of the structures in and around your chest. This test can help diagnose and monitor conditions such as pneumonia and other lung issues his will be done at [facility], [place]. If you should need to call them their phone number is [phone].  Left Decubitus Chest Scan  Follow-Up: At [facility], you and your health needs are our priority.  As part of our continuing mission to provide you with exceptional heart care, we have created designated Provider Care Teams.  These Care Teams include your primary Cardiologist (physician) and Advanced Practice Providers (APPs -  Physician Assistants and Nurse Practitioners) who all work together to provide you with the care you need, when you need it. . You will need a follow up appointment in 1 month with Rosaria Ferries, PA-C  Any Other Special Instructions Will Be Listed Below (If Applicable). None

## 2018-05-11 NOTE — Patient Instructions (Signed)
Continue taking 1 tablet daily except 1/2 tablet every Friday; Repeat INR in 1 week.  *Call coumadin clinic at [phone] with questions and information*

## 2018-05-11 NOTE — Progress Notes (Signed)
Cardiology Office Note   Date:  05/11/2018   ID:  [name], DOB 1923/12/17, MRN [id number]  PCP:  [name], MD Cardiologist:  Sanda Klein, MD 09/08/2017 Rosaria Ferries, PA-C     History of Present Illness: [name] is a 84 y.o. female with a history of D-CHF with allergies to thiazide and loop diuretics plus nausea with ethacrynic acid on spiro, mod AS, HLD, HTN, hypothyroid, OA, BMS RCA 2010, persistent A. fib on metoprolol and Coumadin CHADSVasc 6 (age 89, gender, HTN, CHF, CAD).  LBBB  4/15 office visit, patient doing well, rate was controlled, BP okay, LDL less than 100 is tolerated because she does not wish to increase her medications or go to PCSK9 inhibitors, possible TTR amyloidosis  Admitted 11/30-12/10/2017 for A. fib with RVR, INR 6.21 on admission, left pleural effusion noted, echo okay, troponins negative, INR 2.21 at discharge, thoracentesis 12/3 of 620 cc.  WBC 610, 60% neutrophils, monocytes 37%, no bacteria seen, protein 3.1 g/dL, all cultures negative, no malignant cells  [name] presents for cardiology follow up.   She has been weighing daily, her weight has been 135 lbs at home. They cook at home, she tries to have fresh veg and foods. She learned this after husbands CABG. Does not add salt.   She does not think she has woken up w/ LE edema. Her energy level has been low, she has not been up much. She is working with PT, doing well. However, she is really tired when done. PT checks her BP and has said it was ok.   She has not had chest pain.   No palpitations.   She has had DOE. She normally shops but does not feel she can do that right now. Husband feels she is getting better every day, but it is slow.  Feels she sleeps pretty well. She had PND prior to admission. Sleeps on 2 pillows, chronic.    Past Medical History:  Diagnosis Date  .  Acute respiratiory failure requiring BiPap 06/24/2011  . Allergy   . Aortic valvular  stenosis, moderate to severe 06/24/2011   a. 10/2012 Echo: mod AS, Valve 1.25 cm^2 (VTI), 1.19cm^2 (Vmax).  . Arthritis    "back" (10/30/2012)  . Chronic diastolic CHF (congestive heart failure) ([place])    a. 10/2012 Echo: EF 55-60%, no rwma, Gr 2 DD, moderate AS, mod dil LA, mildly to mod dil RA.  Marland Kitchen Coronary artery disease    a. 03/2009 PCI RCA (3.0x18 BMS).  . Dyslipidemia   . Exertional shortness of breath   . Hypertension   . Hypothyroidism   . PAF (paroxysmal atrial fibrillation) ([place])    a. CHA2DS2VASc = 7-->chronic coumadin;  b. s/p DCCV 05/2011, 05/2012, 07/2014;  c. On amio.  . Rash    a. felt to be 2/2 lasix/torsemide in setting of sulfa allergy (lasix d/c ~ 06/2014, torsemide d/c 08/09/2014).    Past Surgical History:  Procedure Laterality Date  . ANGIOPLASTY    . APPENDECTOMY    . BACK SURGERY    . CARDIAC CATHETERIZATION  10/06/2009   Continue medical therapy  . CARDIAC CATHETERIZATION  04/07/2009   RCA stented with a 3x5mm stent resulting in a reduction of 90% narrowing to normal  . CARDIOVERSION  06/19/2011   Procedure: CARDIOVERSION;  Surgeon: Sanda Klein, MD;  Location: [facility];  Service: Cardiovascular;  Laterality: N/A;  . CARDIOVERSION  07/11/2010   Successful coversion to sinus rhythm  .  CARDIOVERSION N/A 08/10/2014   Procedure: CARDIOVERSION;  Surgeon: Sanda Klein, MD;  Location: [facility];  Service: Cardiovascular;  Laterality: N/A;  . CATARACT EXTRACTION W/ INTRAOCULAR LENS  IMPLANT, BILATERAL    . CORONARY ANGIOPLASTY WITH STENT PLACEMENT     "1" (10/30/2012)  . DILATION AND CURETTAGE OF UTERUS    . EXCISIONAL HEMORRHOIDECTOMY    . FRACTURE SURGERY    . HIP ARTHROPLASTY Right 04/17/2015   Procedure: HIP MONOPOLAR HIP HEMI ARTHROPLASTY;  Surgeon: Marybelle Killings, MD;  Location: [facility];  Service: Orthopedics;  Laterality: Right;  . IR THORACENTESIS ASP PLEURAL SPACE W/IMG GUIDE  04/28/2018  . LUMBAR DISC SURGERY  X 2   "cleaned out scar tissue"   . SHOULDER  ACROMIOPLASTY Left    "fell; put a new ball in" (10/30/2012)  . WRIST FRACTURE SURGERY Left     Current Outpatient Medications  Medication Sig Dispense Refill  . acetaminophen (TYLENOL) 650 MG CR tablet Take 650 mg by mouth 3 (three) times daily. Morning, supper and bedtime    . atorvastatin (LIPITOR) 40 MG tablet Take 1 tablet (40 mg total) by mouth daily at 6 PM. 90 tablet 3  . Cholecalciferol (VITAMIN D) 50 MCG (2000 UT) tablet Take 2,000 Units by mouth daily.    Marland Kitchen diltiazem (CARDIZEM) 60 MG tablet Take 1 tablet (60 mg total) by mouth every 8 (eight) hours. 90 tablet 0  . diphenhydramine-acetaminophen (ACETAMINOPHEN PM) 25-500 MG TABS tablet Take 1 tablet by mouth at bedtime as needed.    . ezetimibe (ZETIA) 10 MG tablet TAKE 1 TABLET DAILY FOR HLD (Patient taking differently: Take 10 mg by mouth daily. ) 90 tablet 3  . folic acid (FOLVITE) 1 MG tablet TAKE 2 TABLETS DAILY (Patient taking differently: Take 2 mg by mouth daily. ) 180 tablet 4  . ipratropium (ATROVENT) 0.03 % nasal spray Place 2 sprays into both nostrils 2 (two) times daily. Use as directed (Patient taking differently: Place 2 sprays into both nostrils daily. ) 30 mL 5  . levothyroxine (SYNTHROID, LEVOTHROID) 88 MCG tablet Take 1 tablet (88 mcg total) by mouth daily before breakfast. 90 tablet 3  . loratadine (CLARITIN) 10 MG tablet Take 10 mg by mouth daily.    . magnesium hydroxide (MILK OF MAGNESIA) 400 MG/5ML suspension Take 15 mLs by mouth daily as needed for mild constipation. 360 mL 0  . metoprolol tartrate (LOPRESSOR) 25 MG tablet TAKE 1 TABLET TWICE A DAY (Patient taking differently: Take 25 mg by mouth 2 (two) times daily. ) 180 tablet 4  . multivitamin-iron-minerals-folic acid (CENTRUM) chewable tablet Chew 1 tablet by mouth daily.    Marland Kitchen OVER THE COUNTER MEDICATION Take 1 tablet by mouth See admin instructions. Stool softener over the counter by mouth daily    . OVER THE COUNTER MEDICATION Take 1 tablet by mouth See  admin instructions. Over the counter pill for osteoarthritis by mouth three time daily    . vitamin C (ASCORBIC ACID) 500 MG tablet Take 500 mg by mouth daily.    Marland Kitchen warfarin (COUMADIN) 2.5 MG tablet TAKE 1 TABLET DAILY OR AS DIRECTED BY COUMADIN CLINIC (Patient not taking: Reported on 05/11/2018) 90 tablet 1   No current facility-administered medications for this visit.     Allergies:   [name] [ethacrynic acid]; Furosemide; Sulfa antibiotics; Tape; [name]; and Torsemide    Social History:  The patient  reports that she has never smoked. She has never used smokeless tobacco. She reports  that she does not drink alcohol or use drugs.   Family History:  The patient's family history includes Alcohol abuse in her child; Cancer in her son; Cancer (age of onset: 68) in her mother; Heart attack in her father; Heart disease in her brother, brother, and father; Heart disease (age of onset: 17) in her mother; Heart failure in her mother; Hypertension in her brother, father, mother, and sister; Hypertension (age of onset: 41) in her child; Lung disease in her brother; Stroke (age of onset: 76) in her mother.  She indicated that her mother is deceased. She indicated that her father is deceased. She indicated that two of her three sisters are alive. She indicated that only one of her four brothers is alive. She indicated that the status of her son is unknown. She indicated that her child is alive.   ROS:  Please see the history of present illness. All other systems are reviewed and negative.    PHYSICAL EXAM: VS:  BP 106/68   Pulse 74   Ht 5\' 3"  (1.6 m)   Wt 133 lb (60.3 kg)   BMI 23.56 kg/m  , BMI Body mass index is 23.56 kg/m. GEN: Well nourished, well developed, elderly female in no acute distress HEENT: normal for age  Neck: Mild JVD, no carotid bruit, no masses Cardiac: Irregular rate and rhythm; 2/6 murmur, no rubs, or gallops Respiratory: Decreased breath sounds bases, L>>R,  few rales right, normal work of breathing GI: soft, nontender, nondistended, + BS MS: no deformity or atrophy; 1-2+ lower extremity edema; distal pulses are 2+ in all 4 extremities  Skin: warm and dry, no rash Neuro:  Strength and sensation are intact Psych: euthymic mood, full affect   EKG:  EKG is ordered today. The ekg ordered today demonstrates atrial fibrillation, heart rate 74, LBBB is old  ECHO:  - Left ventricle: The cavity size was normal. Wall thickness was   increased in a pattern of mild LVH. Systolic function was normal.   The estimated ejection fraction was in the range of 55% to 60%.   Wall motion was normal; there were no regional wall motion   abnormalities. The study is not technically sufficient to allow   evaluation of LV diastolic function. Doppler parameters are   consistent with high ventricular filling pressure. - Aortic valve: Valve mobility was restricted. There was mild to   moderate stenosis. There was trivial regurgitation. - Mitral valve: Severely calcified annulus. Mildly thickened   leaflets . - Left atrium: The atrium was moderately dilated. - Pulmonary arteries: Systolic pressure was mildly increased. PA   peak pressure: 46 mm Hg (S). - Pericardium, extracardiac: A small pericardial effusion was   identified. There was a left pleural effusion.  Impressions:  - Definity used; normal LV systolic function; mild LVH; calcified   aortic valve with mild to moderate AS (mean gradient 18 mmHg);   trace AI; moderate LAE; mild TR; mild pulmonary hypertension;   probable small pericardial effusion.  Recent Labs: 01/14/2018: ALT 18 04/25/2018: B Natriuretic Peptide 232.5; Magnesium 2.3; TSH 0.983 04/30/2018: Hemoglobin 9.6; Platelets 432 05/07/2018: BUN 25; Creatinine, Ser 0.95; Potassium 4.6; Sodium 141  CBC    Component Value Date/Time   WBC 14.7 (H) 04/30/2018 0349   RBC 3.58 (L) 04/30/2018 0349   HGB 9.6 (L) 04/30/2018 0349   HGB 11.5 (L)  01/14/2018 1341   HGB 12.1 07/09/2016 1047   HCT 32.2 (L) 04/30/2018 0349   HCT 37.2 07/09/2016 1047  PLT 432 (H) 04/30/2018 0349   PLT 215 01/14/2018 1341   PLT 258 07/09/2016 1047   MCV 89.9 04/30/2018 0349   MCV 88.7 07/09/2016 1047   MCH 26.8 04/30/2018 0349   MCHC 29.8 (L) 04/30/2018 0349   RDW 15.2 04/30/2018 0349   RDW 15.0 (H) 07/09/2016 1047   LYMPHSABS 1.1 01/14/2018 1341   LYMPHSABS 1.0 07/09/2016 1047   MONOABS 0.6 01/14/2018 1341   MONOABS 0.8 07/09/2016 1047   EOSABS 0.1 01/14/2018 1341   EOSABS 0.1 07/09/2016 1047   BASOSABS 0.0 01/14/2018 1341   BASOSABS 0.0 07/09/2016 1047   CMP Latest Ref Rng & Units 05/07/2018 05/01/2018 04/30/2018  Glucose 65 - 99 mg/dL 119(H) 113(H) 124(H)  BUN 10 - 36 mg/dL 25 30(H) 32(H)  Creatinine 0.57 - 1.00 mg/dL 0.95 0.96 1.02(H)  Sodium 134 - 144 mmol/L 141 141 141  Potassium 3.5 - 5.2 mmol/L 4.6 3.8 4.3  Chloride 96 - 106 mmol/L 103 107 107  CO2 20 - 29 mmol/L 26 24 23   Calcium 8.7 - 10.3 mg/dL 8.7 8.6(L) 8.8(L)  Total Protein 6.5 - 8.1 g/dL - - -  Total Bilirubin 0.3 - 1.2 mg/dL - - -  Alkaline Phos 38 - 126 U/L - - -  AST 15 - 41 U/L - - -  ALT 0 - 44 U/L - - -     Lipid Panel Lab Results  Component Value Date   CHOL 156 05/10/2016   HDL 40 (L) 05/10/2016   LDLCALC 88 05/10/2016   TRIG 141 05/10/2016   CHOLHDL 3.9 05/10/2016      Wt Readings from Last 3 Encounters:  05/11/18 133 lb (60.3 kg)  05/08/18 133 lb (60.3 kg)  05/07/18 133 lb 6.4 oz (60.5 kg)     Other studies Reviewed: Additional studies/ records that were reviewed today include: Office notes, hospital records and testing.  ASSESSMENT AND PLAN:  1.  Transudative left pleural effusion: She has significantly decreased breath sounds on the left side. -Check a two-view chest x-ray with a decubitus on the left to see if she needs additional thoracentesis.  2.  Acute on chronic diastolic CHF: Her weight is not up, but she has some volume overload by  exam. - The patient has been off diuretics since discharge from the hospital.  The only one that she has been able to take in the past to Spironolactone. -Restart this at 25 mg a day, preadmission dose.  3.  Persistent atrial fibrillation: She had RVR when she was recently hospitalized.  She had Cardizem added to the metoprolol she normally takes.  The Cardizem is at 60 mg 3 times daily. - Her blood pressure is borderline and her heart rate may be dropping too low at rest. -Decrease the diltiazem to 120 mg daily Cardizem CD and follow heart rate/blood pressure, okay to take the 60 mg tablets twice daily to use them up.  4.  Chronic anticoagulation with Coumadin - This is followed by the pharmacist here, her INR had been elevated when she was admitted recently -Her INR was 2.6 today.   Current medicines are reviewed at length with the patient today.  The patient has concerns regarding medicines.  Concerns were addressed  The following changes have been made: Restart Spironolactone, change Cardizem  Labs/ tests ordered today include:   Orders Placed This Encounter  Procedures  . DG Chest 2 View  . DG Chest Left Decubitus  . EKG 12-Lead     Disposition:  FU with Sanda Klein, MD  Signed, Rosaria Ferries, PA-C  05/11/2018 3:12 PM    [place] Phone: [phone]; Fax: [phone]

## 2018-05-12 ENCOUNTER — Telehealth: Payer: Self-pay

## 2018-05-12 ENCOUNTER — Ambulatory Visit
Admission: RE | Admit: 2018-05-12 | Discharge: 2018-05-12 | Disposition: A | Discharge status: Home / Self Care | Payer: Medicare Other | Source: Ambulatory Visit | Attending: Physician Assistant | Admitting: Physician Assistant

## 2018-05-12 DIAGNOSIS — J90 Pleural effusion, not elsewhere classified: Secondary | ICD-10-CM

## 2018-05-12 NOTE — Telephone Encounter (Signed)
Called to talk to Raquel about starting home health inr

## 2018-05-13 ENCOUNTER — Telehealth: Payer: Self-pay | Admitting: Family Medicine

## 2018-05-13 NOTE — Telephone Encounter (Signed)
Called pt to find out which home healthcare service that they are using. [name] office [phone] to try to set up in home inr checks.

## 2018-05-13 NOTE — Telephone Encounter (Signed)
Called pt to to see why they canceled  there INR appt states she just had it checked at the Cardiologist office 05-11-2018 and it was 2.6 and states that [facility] is going to get then set up with them being able do it at home starting Dec the 23rd, They want to know if they still need to come back in and have it checked, since it was just done at the cardiologist dr  Pt son can be reached at [phone]

## 2018-05-13 NOTE — Telephone Encounter (Signed)
Orders sent to [facility] at [phone] att [facility]

## 2018-05-13 NOTE — Telephone Encounter (Signed)
Called pt left message stating that we need the name and contact info for the home health service that they are getting to determine if we can get home inr checks established

## 2018-05-13 NOTE — Telephone Encounter (Signed)
If she is set up to get the PT/INR done at home, that is fine.  I will assume they will be working through cardiology for that.

## 2018-05-14 ENCOUNTER — Other Ambulatory Visit: Payer: Self-pay

## 2018-05-14 ENCOUNTER — Other Ambulatory Visit: Payer: Self-pay | Admitting: Physician Assistant

## 2018-05-14 ENCOUNTER — Telehealth: Payer: Self-pay

## 2018-05-14 DIAGNOSIS — J90 Pleural effusion, not elsewhere classified: Secondary | ICD-10-CM

## 2018-05-14 NOTE — Telephone Encounter (Signed)
[name] from [facility] is requesting a verbal for skilled nursing.Pt reported an irregular heart beat but has settled down. Pt vitals were fine but b/p was 100/60. Also need verbal for coumadin clinic. Number [phone]. Please advise. [place]

## 2018-05-14 NOTE — Telephone Encounter (Signed)
ok 

## 2018-05-15 ENCOUNTER — Telehealth: Payer: Self-pay

## 2018-05-15 ENCOUNTER — Telehealth: Payer: Self-pay | Admitting: Physician Assistant

## 2018-05-15 NOTE — Telephone Encounter (Signed)
Roselie Awkward called from [facility] stating he needed verbal orders for skilled nursing for the patient. He was informed that provider was out of the office and stated he would fax the orders over.

## 2018-05-15 NOTE — Telephone Encounter (Signed)
Done and they will be faxing over results from coumadin clinic. [place]

## 2018-05-15 NOTE — Telephone Encounter (Signed)
Per Rosaria Ferries, PA, pt has been scheduled for Thoracentesis on 05/19/18 at 10 AM (arrival at 9:45 AM). Spoke to pt and her husband to make them aware. They stated they are able to do this and verbalized understanding of instructions.     FW: Thoracentesis  Received: Yesterday  Message Contents  Barrett, Evelene Croon, PA-C  Cannon Kettle Earnstine Regal, LPN      Previous Messages    ----- Message -----  From: Lynnea Ferrier  Sent: 05/13/2018  5:07 PM EST  To: Lonn Georgia, PA-C  Subject: RE: Thoracentesis                 Hi Rhonda,   If you would like a repeat thoracentesis you can place an order in Epic called "IR thoracentesis" and follow the prompts before signing. The order can be for a one time procedure or you can place an end date and you or the patient can reschedule this procedure as needed without placing a separate order.   After the order is placed you or the patient can contact the IR department at [phone] to schedule an appointment to have this done as an outpatient, we schedule these procedures Monday-Friday at 9 am, 10 am, 1 pm and 2 pm.   Let me know if there's anything else I can help you with!   Thanks,  Larene Beach  ----- Message -----  From: Reola Mosher  Sent: 05/12/2018  4:31 PM EST  To: Joaquim Nam, PA-C, *  Subject: Thoracentesis                   You did thoracentesis on this patient on 12/30. I saw her in clinic 12/16.  Lung sounds were poor, mostly on the L, so I got repeat CXRs.  Results are below. I spoke to Dr. Sallyanne Kuster and he wants to refer her for repeat thoracentesis. I am not quite sure how to do this, so I am reaching out to you. I am CCing the nurse I work with, Caprice Beaver on this message.  If you can let us know how to go about this, we would appreciate it.  I realize it probably won't be you, but thought you could help [place] get this going.   Thanks in advance for  your help.  Donnie Aho     Dg Chest 2 View   Result Date: 05/12/2018  CLINICAL DATA: 84 year old female with left-sided pleural effusion. Some shortness of breath. Post thoracentesis 04/28/2018. Subsequent encounter. EXAM: CHEST - 2 VIEW; CHEST - LEFT DECUBITUS COMPARISON: 04/28/2018. FINDINGS: Interval redevelopment of moderately large left-sided pleural effusion which freely layers on decubitus view. Etiology indeterminate. Minimal blunting right costophrenic angle. Biapical pleural thickening without associated bony destruction greater on the right and similar to prior exam. Cardiomegaly. Central pulmonary vascular prominence, stable. Calcified aorta. Prior left shoulder replacement. Mild degenerative changes lower thoracic spine. IMPRESSION: 1. Interval redevelopment of moderately large left-sided pleural effusion which freely layers on decubitus view. 2. Minimal blunting right costophrenic angle may represent a very small right-sided pleural effusion. 3. Stable cardiomegaly and central pulmonary vascular prominence. 4. Aortic Atherosclerosis (ICD10-I70.0). Electronically Signed  By: Genia Del M.D.  On: 05/12/2018 14:31   Dg Chest Left Decubitus   Result Date: 05/12/2018  CLINICAL DATA: 84 year old female with left-sided pleural effusion. Some shortness of breath. Post thoracentesis 04/28/2018. Subsequent encounter. EXAM: CHEST - 2 VIEW; CHEST - LEFT DECUBITUS COMPARISON: 04/28/2018. FINDINGS: Interval redevelopment of  moderately large left-sided pleural effusion which freely layers on decubitus view. Etiology indeterminate. Minimal blunting right costophrenic angle. Biapical pleural thickening without associated bony destruction greater on the right and similar to prior exam. Cardiomegaly. Central pulmonary vascular prominence, stable. Calcified aorta. Prior left shoulder replacement. Mild degenerative changes lower thoracic spine. IMPRESSION: 1. Interval redevelopment of moderately large  left-sided pleural effusion which freely layers on decubitus view. 2. Minimal blunting right costophrenic angle may represent a very small right-sided pleural effusion. 3. Stable cardiomegaly and central pulmonary vascular prominence. 4. Aortic Atherosclerosis (ICD10-I70.0). Electronically Signed  By: Genia Del M.D.  On: 05/12/2018 14:31

## 2018-05-18 ENCOUNTER — Ambulatory Visit (INDEPENDENT_AMBULATORY_CARE_PROVIDER_SITE_OTHER): Payer: Medicare Other | Admitting: Pharmacist Clinician (PhC)/ Clinical Pharmacy Specialist

## 2018-05-18 DIAGNOSIS — Z7901 Long term (current) use of anticoagulants: Secondary | ICD-10-CM

## 2018-05-18 DIAGNOSIS — I4891 Unspecified atrial fibrillation: Secondary | ICD-10-CM | POA: Diagnosis not present

## 2018-05-18 DIAGNOSIS — I480 Paroxysmal atrial fibrillation: Secondary | ICD-10-CM

## 2018-05-18 LAB — POCT INR: INR: 6 — AB (ref 2.0–3.0)

## 2018-05-19 ENCOUNTER — Ambulatory Visit (HOSPITAL_COMMUNITY): Payer: Medicare Other

## 2018-05-21 ENCOUNTER — Ambulatory Visit (INDEPENDENT_AMBULATORY_CARE_PROVIDER_SITE_OTHER): Payer: Medicare Other | Admitting: Cardiovascular Disease

## 2018-05-21 DIAGNOSIS — Z7901 Long term (current) use of anticoagulants: Secondary | ICD-10-CM | POA: Diagnosis not present

## 2018-05-21 DIAGNOSIS — I480 Paroxysmal atrial fibrillation: Secondary | ICD-10-CM

## 2018-05-21 LAB — POCT INR: INR: 4.5 — AB (ref 2.0–3.0)

## 2018-05-22 ENCOUNTER — Other Ambulatory Visit (HOSPITAL_COMMUNITY): Payer: Medicare Other

## 2018-05-25 ENCOUNTER — Ambulatory Visit (INDEPENDENT_AMBULATORY_CARE_PROVIDER_SITE_OTHER): Payer: Medicare Other | Admitting: Pharmacist Clinician (PhC)/ Clinical Pharmacy Specialist

## 2018-05-25 DIAGNOSIS — Z7901 Long term (current) use of anticoagulants: Secondary | ICD-10-CM

## 2018-05-25 DIAGNOSIS — I480 Paroxysmal atrial fibrillation: Secondary | ICD-10-CM | POA: Diagnosis not present

## 2018-05-25 DIAGNOSIS — I4891 Unspecified atrial fibrillation: Secondary | ICD-10-CM

## 2018-05-25 LAB — POCT INR: INR: 1.8 — AB (ref 2.0–3.0)

## 2018-05-26 ENCOUNTER — Encounter: Payer: Self-pay | Admitting: Physician Assistant

## 2018-05-26 ENCOUNTER — Ambulatory Visit (HOSPITAL_COMMUNITY)
Admission: RE | Admit: 2018-05-26 | Discharge: 2018-05-26 | Disposition: A | Discharge status: Home / Self Care | Payer: Medicare Other | Source: Ambulatory Visit | Attending: Physician Assistant | Admitting: Physician Assistant

## 2018-05-26 ENCOUNTER — Other Ambulatory Visit (HOSPITAL_COMMUNITY): Payer: Self-pay | Admitting: Physician Assistant

## 2018-05-26 DIAGNOSIS — Z9889 Other specified postprocedural states: Secondary | ICD-10-CM

## 2018-05-26 DIAGNOSIS — J90 Pleural effusion, not elsewhere classified: Secondary | ICD-10-CM

## 2018-05-26 HISTORY — PX: IR THORACENTESIS ASP PLEURAL SPACE W/IMG GUIDE: IMG5380

## 2018-05-26 MED ORDER — LIDOCAINE HCL 1 % IJ SOLN
INTRAMUSCULAR | Status: AC
  Filled 2018-05-26: qty 20

## 2018-05-26 MED ORDER — LIDOCAINE HCL (PF) 1 % IJ SOLN
INTRAMUSCULAR | Status: DC | PRN
  Administered 2018-05-26: 10 mL

## 2018-05-26 NOTE — Procedures (Signed)
PROCEDURE SUMMARY:  Successful image-guided left thoracentesis. Yielded 800 milliliters of amber fluid. Patient tolerated procedure well. EBL: Zero No immediate complications.  Specimen was not sent for labs. Post procedure CXR shows no pneumothorax.  Please see imaging section of Epic for full dictation.  Joaquim Nam PA-C 05/26/2018 9:35 AM

## 2018-05-28 ENCOUNTER — Telehealth: Payer: Self-pay

## 2018-05-28 NOTE — Telephone Encounter (Signed)
Spoke with Agapito Games from [facility] who states their services is ending today and need to know if Dr. Loletha Grayer would like for them to continue. If so, pt would need a new order. Will route to MD.

## 2018-05-28 NOTE — Telephone Encounter (Signed)
Please continue services MCr

## 2018-05-29 NOTE — Telephone Encounter (Signed)
[name] from [facility] updated. Verbalized understanding.

## 2018-06-01 ENCOUNTER — Ambulatory Visit (INDEPENDENT_AMBULATORY_CARE_PROVIDER_SITE_OTHER): Payer: Medicare Other | Admitting: Pharmacist Clinician (PhC)/ Clinical Pharmacy Specialist

## 2018-06-01 DIAGNOSIS — Z7901 Long term (current) use of anticoagulants: Secondary | ICD-10-CM

## 2018-06-01 DIAGNOSIS — I480 Paroxysmal atrial fibrillation: Secondary | ICD-10-CM

## 2018-06-01 LAB — POCT INR: INR: 2.1 (ref 2.0–3.0)

## 2018-06-01 NOTE — Patient Instructions (Signed)
Description   Continue with 1 tablet daily except 1/2 tablet each Monday and Friday.  Repeat INR in 2 weeks

## 2018-06-17 ENCOUNTER — Ambulatory Visit (INDEPENDENT_AMBULATORY_CARE_PROVIDER_SITE_OTHER): Payer: Medicare Other | Admitting: Pharmacist Clinician (PhC)/ Clinical Pharmacy Specialist

## 2018-06-17 DIAGNOSIS — Z7901 Long term (current) use of anticoagulants: Secondary | ICD-10-CM

## 2018-06-17 DIAGNOSIS — I480 Paroxysmal atrial fibrillation: Secondary | ICD-10-CM

## 2018-06-17 LAB — POCT INR: INR: 5.7 — AB (ref 2.0–3.0)

## 2018-06-17 NOTE — Patient Instructions (Signed)
No warfarin for 3 days (Wed Jan 22, Thurs Jan 23, and Fri Jan 24).  Restart Saturday with 1 tablet daily except 1/2 tablet each Monday, Wednesday and Friday.  Repeat INR in 2 weeks

## 2018-06-22 ENCOUNTER — Telehealth: Payer: Self-pay | Admitting: Cardiovascular Disease

## 2018-06-22 NOTE — Telephone Encounter (Signed)
Spoke to patient and son.  Per son, patient is taking diltiazem 60 mg once a day instead of every 8 hours. Patient has been taking medication since last hospitalization.    Informed patient and son that she has an appointment this week on 06/26/18-  will not fill medication until patient is seen for this appointment . there is possibility the medication will be changed to a long acting medication and use this one for prn basis.  patient and son voice understanding.

## 2018-06-22 NOTE — Telephone Encounter (Signed)
New Message        *STAT* If patient is at the pharmacy, call can be transferred to refill team.   1. Which medications need to be refilled? (please list name of each medication and dose if known) Diltiazem HCL 60 mg  2. Which pharmacy/location (including street and city if local pharmacy) is medication to be sent to? [facility]  3. Do they need a 30 day or 90 day supply? [place]

## 2018-06-26 ENCOUNTER — Ambulatory Visit (INDEPENDENT_AMBULATORY_CARE_PROVIDER_SITE_OTHER): Payer: Medicare Other | Admitting: Physician Assistant

## 2018-06-26 ENCOUNTER — Ambulatory Visit
Admission: RE | Admit: 2018-06-26 | Discharge: 2018-06-26 | Disposition: A | Discharge status: Home / Self Care | Payer: Medicare Other | Source: Ambulatory Visit | Attending: Physician Assistant | Admitting: Physician Assistant

## 2018-06-26 ENCOUNTER — Encounter: Payer: Self-pay | Admitting: Physician Assistant

## 2018-06-26 VITALS — BP 119/72 | HR 97 | Ht 63.0 in | Wt 126.0 lb

## 2018-06-26 DIAGNOSIS — I5032 Chronic diastolic (congestive) heart failure: Secondary | ICD-10-CM | POA: Diagnosis not present

## 2018-06-26 DIAGNOSIS — I4821 Permanent atrial fibrillation: Secondary | ICD-10-CM | POA: Diagnosis not present

## 2018-06-26 DIAGNOSIS — J90 Pleural effusion, not elsewhere classified: Secondary | ICD-10-CM | POA: Diagnosis not present

## 2018-06-26 DIAGNOSIS — Z7901 Long term (current) use of anticoagulants: Secondary | ICD-10-CM

## 2018-06-26 MED ORDER — DILTIAZEM HCL ER COATED BEADS 120 MG PO CP24: 120.0000 mg | ORAL_CAPSULE | Freq: Every day | ORAL | 0 refills | Status: DC

## 2018-06-26 NOTE — Progress Notes (Signed)
Cardiology Office Note   Date:  06/26/2018   ID:  [name], DOB 1924/04/14, MRN [id number]  PCP:  [name], MD Cardiologist:  Sanda Klein, MD 09/08/2017 Rosaria Ferries, PA-C 05/11/2018  No chief complaint on file.   History of Present Illness: [name] is a 85 y.o. female with a history of D-CHF with allergies to thiazide and loop diuretics plus nausea with ethacrynic acid on spiro, mod AS, HLD (patient prefers no med changes), HTN, hypothyroid, OA, BMS RCA 2010, persistent A. fib on metoprolol and Coumadin, CHADSVasc 6 (age 62, gender, HTN, CHF, CAD).  LBBB, TTR amyloidosis, left pleural effusion s/p t-centesis x 2  05/11/2018 office visit, increasing shortness of breath and physical exam consistent with worsening pleural effusion, patient ended up getting thoracentesis of 800 mL 12/31  [name] presents for cardiology follow up.  She feels she is breathing well. She is able to vacuum. She is able to once again make pies, sweet potato pies right now. She is able to go to church.   Denies DOE, but says she is not doing that much. Husband says she is busy all the time.   Sleeping better, no orthopnea or PND.   No chest pains.  She had 1 episode of palpitations since the thoracentesis, a day or 2 later. None since.   Compliant w/ coumadin, check scheduled next Weds. Her level was high at 5.7 on 01/27. No bleeding issues.    Past Medical History:  Diagnosis Date  .  Acute respiratiory failure requiring BiPap 06/24/2011  . Allergy   . Aortic valvular stenosis, moderate to severe 06/24/2011   a. 10/2012 Echo: mod AS, Valve 1.25 cm^2 (VTI), 1.19cm^2 (Vmax).  . Arthritis    "back" (10/30/2012)  . Chronic diastolic CHF (congestive heart failure) ([place])    a. 10/2012 Echo: EF 55-60%, no rwma, Gr 2 DD, moderate AS, mod dil LA, mildly to mod dil RA.  Marland Kitchen Coronary artery disease    a. 03/2009 PCI RCA (3.0x18 BMS).  . Dyslipidemia   . Exertional shortness of  breath   . Hypertension   . Hypothyroidism   . PAF (paroxysmal atrial fibrillation) ([place])    a. CHA2DS2VASc = 7-->chronic coumadin;  b. s/p DCCV 05/2011, 05/2012, 07/2014;  c. On amio.  . Rash    a. felt to be 2/2 lasix/torsemide in setting of sulfa allergy (lasix d/c ~ 06/2014, torsemide d/c 08/09/2014).    Past Surgical History:  Procedure Laterality Date  . ANGIOPLASTY    . APPENDECTOMY    . BACK SURGERY    . CARDIAC CATHETERIZATION  10/06/2009   Continue medical therapy  . CARDIAC CATHETERIZATION  04/07/2009   RCA stented with a 3x7mm stent resulting in a reduction of 90% narrowing to normal  . CARDIOVERSION  06/19/2011   Procedure: CARDIOVERSION;  Surgeon: Sanda Klein, MD;  Location: [facility];  Service: Cardiovascular;  Laterality: N/A;  . CARDIOVERSION  07/11/2010   Successful coversion to sinus rhythm  . CARDIOVERSION N/A 08/10/2014   Procedure: CARDIOVERSION;  Surgeon: Sanda Klein, MD;  Location: [facility];  Service: Cardiovascular;  Laterality: N/A;  . CATARACT EXTRACTION W/ INTRAOCULAR LENS  IMPLANT, BILATERAL    . CORONARY ANGIOPLASTY WITH STENT PLACEMENT     "1" (10/30/2012)  . DILATION AND CURETTAGE OF UTERUS    . EXCISIONAL HEMORRHOIDECTOMY    . FRACTURE SURGERY    . HIP ARTHROPLASTY Right 04/17/2015   Procedure: HIP MONOPOLAR HIP HEMI  ARTHROPLASTY;  Surgeon: Marybelle Killings, MD;  Location: [facility];  Service: Orthopedics;  Laterality: Right;  . IR THORACENTESIS ASP PLEURAL SPACE W/IMG GUIDE  04/28/2018  . IR THORACENTESIS ASP PLEURAL SPACE W/IMG GUIDE  05/26/2018  . LUMBAR DISC SURGERY  X 2   "cleaned out scar tissue"   . SHOULDER ACROMIOPLASTY Left    "fell; put a new ball in" (10/30/2012)  . WRIST FRACTURE SURGERY Left     Current Outpatient Medications  Medication Sig Dispense Refill  . acetaminophen (TYLENOL) 650 MG CR tablet Take 650 mg by mouth 3 (three) times daily. Morning, supper and bedtime    . atorvastatin (LIPITOR) 40 MG tablet Take 1 tablet (40 mg total) by  mouth daily at 6 PM. 90 tablet 3  . Cholecalciferol (VITAMIN D) 50 MCG (2000 UT) tablet Take 2,000 Units by mouth daily.    Marland Kitchen diltiazem (CARDIZEM CD) 120 MG 24 hr capsule Take 1 capsule (120 mg total) by mouth daily. 90 capsule 0  . diphenhydramine-acetaminophen (ACETAMINOPHEN PM) 25-500 MG TABS tablet Take 1 tablet by mouth at bedtime as needed.    . ezetimibe (ZETIA) 10 MG tablet TAKE 1 TABLET DAILY FOR HLD (Patient taking differently: Take 10 mg by mouth daily. ) 90 tablet 3  . folic acid (FOLVITE) 1 MG tablet TAKE 2 TABLETS DAILY (Patient taking differently: Take 2 mg by mouth daily. ) 180 tablet 4  . ipratropium (ATROVENT) 0.03 % nasal spray Place 2 sprays into both nostrils 2 (two) times daily. Use as directed (Patient taking differently: Place 2 sprays into both nostrils daily. ) 30 mL 5  . levothyroxine (SYNTHROID, LEVOTHROID) 88 MCG tablet Take 1 tablet (88 mcg total) by mouth daily before breakfast. 90 tablet 3  . loratadine (CLARITIN) 10 MG tablet Take 10 mg by mouth daily.    . magnesium hydroxide (MILK OF MAGNESIA) 400 MG/5ML suspension Take 15 mLs by mouth daily as needed for mild constipation. 360 mL 0  . metoprolol tartrate (LOPRESSOR) 25 MG tablet TAKE 1 TABLET TWICE A DAY (Patient taking differently: Take 25 mg by mouth 2 (two) times daily. ) 180 tablet 4  . multivitamin-iron-minerals-folic acid (CENTRUM) chewable tablet Chew 1 tablet by mouth daily.    Marland Kitchen OVER THE COUNTER MEDICATION Take 1 tablet by mouth See admin instructions. Stool softener over the counter by mouth daily    . OVER THE COUNTER MEDICATION Take 1 tablet by mouth See admin instructions. Over the counter pill for osteoarthritis by mouth three time daily    . spironolactone (ALDACTONE) 25 MG tablet Take 1 tablet (25 mg total) by mouth daily. 90 tablet 0  . vitamin C (ASCORBIC ACID) 500 MG tablet Take 500 mg by mouth daily.    Marland Kitchen warfarin (COUMADIN) 2.5 MG tablet TAKE 1 TABLET DAILY OR AS DIRECTED BY COUMADIN CLINIC 90  tablet 1  . diltiazem (CARDIZEM) 60 MG tablet Take 1 tablet (60 mg total) by mouth every 8 (eight) hours. 90 tablet 0   No current facility-administered medications for this visit.     Allergies:   [name] [ethacrynic acid]; Furosemide; Sulfa antibiotics; Tape; [name]; and Torsemide    Social History:  The patient  reports that she has never smoked. She has never used smokeless tobacco. She reports that she does not drink alcohol or use drugs.   Family History:  The patient's family history includes Alcohol abuse in her child; Cancer in her son; Cancer (age of onset: 1) in her  mother; Heart attack in her father; Heart disease in her brother, brother, and father; Heart disease (age of onset: 78) in her mother; Heart failure in her mother; Hypertension in her brother, father, mother, and sister; Hypertension (age of onset: 64) in her child; Lung disease in her brother; Stroke (age of onset: 60) in her mother.  She indicated that her mother is deceased. She indicated that her father is deceased. She indicated that two of her three sisters are alive. She indicated that only one of her four brothers is alive. She indicated that the status of her son is unknown. She indicated that her child is alive.   ROS:  Please see the history of present illness. All other systems are reviewed and negative.    PHYSICAL EXAM: VS:  BP 119/72 (BP Location: Right Arm, Patient Position: Sitting)   Pulse 97   Ht 5\' 3"  (1.6 m)   Wt 126 lb (57.2 kg)   SpO2 97%   BMI 22.32 kg/m  , BMI Body mass index is 22.32 kg/m. GEN: Well nourished, well developed, female in no acute distress HEENT: normal for age  Neck: minimal JVD, no carotid bruit, no masses Cardiac: Irreg R & R; 2/6 murmur, no rubs, or gallops Respiratory:  Decreased BS L, normal work of breathing GI: soft, nontender, nondistended, + BS MS: no deformity or atrophy; no edema; distal pulses are 2+ in all 4 extremities  Skin: warm and  dry, no rash Neuro:  Strength and sensation are intact Psych: euthymic mood, full affect   EKG:  EKG is ordered today. The ekg ordered today demonstrates atrial fib, heart rate 97, PVCs noted, right axis deviation, left bundle branch block with QRS duration 134 ms, some morphology changes from 05/11/2018 ECG but similar to 04/26/2018 ECG  ECHO: 04/28/2018 - Left ventricle: The cavity size was normal. Wall thickness was increased in a pattern of mild LVH. Systolic function was normal. The estimated ejection fraction was in the range of 55% to 60%. Wall motion was normal; there were no regional wall motion abnormalities. The study is not technically sufficient to allow evaluation of LV diastolic function. Doppler parameters are consistent with high ventricular filling pressure. - Aortic valve: Valve mobility was restricted. There was mild to moderate stenosis. There was trivial regurgitation. - Mitral valve: Severely calcified annulus. Mildly thickened leaflets . - Left atrium: The atrium was moderately dilated. - Pulmonary arteries: Systolic pressure was mildly increased. PA peak pressure: 46 mm Hg (S). - Pericardium, extracardiac: A small pericardial effusion was identified. There was a left pleural effusion.  Impressions:  - Definity used; normal LV systolic function; mild LVH; calcified aortic valve with mild to moderate AS (mean gradient 18 mmHg); trace AI; moderate LAE; mild TR; mild pulmonary hypertension; probable small pericardial effusion.   Recent Labs: 01/14/2018: ALT 18 04/25/2018: B Natriuretic Peptide 232.5; Magnesium 2.3; TSH 0.983 04/30/2018: Hemoglobin 9.6; Platelets 432 05/07/2018: BUN 25; Creatinine, Ser 0.95; Potassium 4.6; Sodium 141  CBC    Component Value Date/Time   WBC 14.7 (H) 04/30/2018 0349   RBC 3.58 (L) 04/30/2018 0349   HGB 9.6 (L) 04/30/2018 0349   HGB 11.5 (L) 01/14/2018 1341   HGB 12.1 07/09/2016 1047   HCT 32.2  (L) 04/30/2018 0349   HCT 37.2 07/09/2016 1047   PLT 432 (H) 04/30/2018 0349   PLT 215 01/14/2018 1341   PLT 258 07/09/2016 1047   MCV 89.9 04/30/2018 0349   MCV 88.7 07/09/2016 1047   MCH  26.8 04/30/2018 0349   MCHC 29.8 (L) 04/30/2018 0349   RDW 15.2 04/30/2018 0349   RDW 15.0 (H) 07/09/2016 1047   LYMPHSABS 1.1 01/14/2018 1341   LYMPHSABS 1.0 07/09/2016 1047   MONOABS 0.6 01/14/2018 1341   MONOABS 0.8 07/09/2016 1047   EOSABS 0.1 01/14/2018 1341   EOSABS 0.1 07/09/2016 1047   BASOSABS 0.0 01/14/2018 1341   BASOSABS 0.0 07/09/2016 1047   CMP Latest Ref Rng & Units 05/07/2018 05/01/2018 04/30/2018  Glucose 65 - 99 mg/dL 119(H) 113(H) 124(H)  BUN 10 - 36 mg/dL 25 30(H) 32(H)  Creatinine 0.57 - 1.00 mg/dL 0.95 0.96 1.02(H)  Sodium 134 - 144 mmol/L 141 141 141  Potassium 3.5 - 5.2 mmol/L 4.6 3.8 4.3  Chloride 96 - 106 mmol/L 103 107 107  CO2 20 - 29 mmol/L 26 24 23   Calcium 8.7 - 10.3 mg/dL 8.7 8.6(L) 8.8(L)  Total Protein 6.5 - 8.1 g/dL - - -  Total Bilirubin 0.3 - 1.2 mg/dL - - -  Alkaline Phos 38 - 126 U/L - - -  AST 15 - 41 U/L - - -  ALT 0 - 44 U/L - - -     Lipid Panel Lab Results  Component Value Date   CHOL 156 05/10/2016   HDL 40 (L) 05/10/2016   LDLCALC 88 05/10/2016   TRIG 141 05/10/2016   CHOLHDL 3.9 05/10/2016      Wt Readings from Last 3 Encounters:  06/26/18 126 lb (57.2 kg)  05/11/18 133 lb (60.3 kg)  05/08/18 133 lb (60.3 kg)     Other studies Reviewed: Additional studies/ records that were reviewed today include: office notes, hospital records and testing.  ASSESSMENT AND PLAN:  1.  Left pleural effusion: Recheck a two-view chest x-ray today.  She has decreased breath sounds on the left, but no significant systemic volume overload.  -Discuss with Dr. Sallyanne Kuster if a higher dose of Spironolactone might help keep the pleural effusion from reoccurring  2.  Permanent atrial fibrillation: Her heart rate is generally well controlled.  There was  confusion on the Cardizem dosage, but the bottle of 60 mg tablets was empty and she has not been taking them. -Continue Cardizem CD 120 mg daily and let us know she has more palpitations.  3.  Chronic anticoagulation: Although her Coumadin level was elevated at last check, she reports no bleeding issues or symptoms. -Keep Coumadin clinic appointments and report bleeding  4.  Chronic diastolic CHF: Her weight is down 6 pounds from December.  She has no significant volume overload by exam.  Continue current therapy.   Current medicines are reviewed at length with the patient today.  The patient has concerns regarding medicines.  Concerns were addressed  The following changes have been made: DC Cardizem 60 mg tablets, take Cardizem CD 120 mg tablets  Labs/ tests ordered today include:   Orders Placed This Encounter  Procedures  . DG Chest 2 View  . EKG 12-Lead     Disposition:   FU with Sanda Klein, MD  Signed, Rosaria Ferries, PA-C  06/26/2018 2:13 PM    [facility] Phone: [phone]; Fax: [phone]

## 2018-06-26 NOTE — Patient Instructions (Signed)
Medication Instructions:  NO CHANGES- Your physician recommends that you continue on your current medications as directed. Please refer to the Current Medication list given to you today. If you need a refill on your cardiac medications before your next appointment, please call your pharmacy.  Labwork: none  When you have your labs (blood work) drawn today and your tests are completely normal, you will receive your results only by MyChart Message (if you have MyChart) -OR-  A paper copy in the mail.  If you have any lab test that is abnormal or we need to change your treatment, we will call you to review these results.  Testing/Procedures: A chest x-ray takes a picture of the organs and structures inside the chest, including the heart, lungs, and blood vessels. This test can show several things, including, whether the heart is enlarges; whether fluid is building up in the lungs; and whether pacemaker / defibrillator leads are still in place. THIS WILL BE DONE AT [facility] MAP PROVIDED  Special Instructions: CALL IF YOUR SYMPTOMS WORSEN MAKE SURE TO KEEP YOUR SCHEDULE COUMADIN CHECK  Follow-Up: You will need a follow up appointment in April WITHMihai Croitoru, MD or one of the following Advanced Practice Providers on your designated Care Team:  Almyra Deforest, [place]  Fabian Sharp, [place]   At [facility], you and your health needs are our priority.  As part of our continuing mission to provide you with exceptional heart care, we have created designated Provider Care Teams.  These Care Teams include your primary Cardiologist (physician) and Advanced Practice Providers (APPs -  Physician Assistants and Nurse Practitioners) who all work together to provide you with the care you need, when you need it.  Thank you for choosing [facility] at [facility]!!

## 2018-06-28 NOTE — Progress Notes (Signed)
We could try more spironolactone. Normal kidney function, K acceptable. Hope BP will allow it. Increase spironolactone to 50 mg daily and bring back for BMET in 10-14 days. Ask her to avoid large quantities of K rich foods. Please check BP daily and call if SBP 100 or less or symptoms of hypotension. MCr

## 2018-06-29 ENCOUNTER — Telehealth: Payer: Self-pay

## 2018-06-29 ENCOUNTER — Other Ambulatory Visit: Payer: Self-pay

## 2018-06-29 DIAGNOSIS — I110 Hypertensive heart disease with heart failure: Secondary | ICD-10-CM

## 2018-06-29 DIAGNOSIS — I4891 Unspecified atrial fibrillation: Secondary | ICD-10-CM

## 2018-06-29 DIAGNOSIS — I5032 Chronic diastolic (congestive) heart failure: Principal | ICD-10-CM

## 2018-06-29 MED ORDER — SPIRONOLACTONE 25 MG PO TABS: ORAL_TABLET | ORAL | 3 refills | Status: DC

## 2018-06-29 NOTE — Progress Notes (Signed)
Met

## 2018-06-29 NOTE — Telephone Encounter (Signed)
Spoke to patient Dr.Croitoru advised normal kidney function.Advised to increase spironolactone to 50 mg daily.Bmet in 10 to 14 days.Lab order mailed.Advised to avoid large quantities of potassium enriched foods.Advised to check B/P daily and if systolic B/P 264 or below and if has any symptoms of hypotension call office.

## 2018-07-01 ENCOUNTER — Ambulatory Visit (INDEPENDENT_AMBULATORY_CARE_PROVIDER_SITE_OTHER): Payer: Medicare Other | Admitting: Pharmacist

## 2018-07-01 DIAGNOSIS — I480 Paroxysmal atrial fibrillation: Secondary | ICD-10-CM

## 2018-07-01 DIAGNOSIS — Z7901 Long term (current) use of anticoagulants: Secondary | ICD-10-CM

## 2018-07-01 LAB — POCT INR: INR: 1.7 — AB (ref 2.0–3.0)

## 2018-07-16 ENCOUNTER — Ambulatory Visit (INDEPENDENT_AMBULATORY_CARE_PROVIDER_SITE_OTHER): Payer: Medicare Other | Admitting: Pharmacist Clinician (PhC)/ Clinical Pharmacy Specialist

## 2018-07-16 DIAGNOSIS — Z7901 Long term (current) use of anticoagulants: Secondary | ICD-10-CM

## 2018-07-16 DIAGNOSIS — I480 Paroxysmal atrial fibrillation: Secondary | ICD-10-CM

## 2018-07-16 LAB — POCT INR: INR: 1 — AB (ref 2.0–3.0)

## 2018-07-17 NOTE — Telephone Encounter (Signed)
Patient was in office Thursday 07/16/2018 for INR check.  Reported feeling well, no hypotensive symptoms from increased dose of spironolactone.  Home BP readings did show 4 of 9 readings with systolic < 800 (34-91) but patient reports no concerns.    Reviewed with Dr. Sallyanne Kuster.  Will have patient continue with spironolactone 50 mg qd and repeat BMET at her next INR check.

## 2018-08-06 ENCOUNTER — Other Ambulatory Visit: Payer: Self-pay

## 2018-08-06 ENCOUNTER — Ambulatory Visit (INDEPENDENT_AMBULATORY_CARE_PROVIDER_SITE_OTHER): Payer: Medicare Other | Admitting: Pharmacist Clinician (PhC)/ Clinical Pharmacy Specialist

## 2018-08-06 DIAGNOSIS — Z7901 Long term (current) use of anticoagulants: Secondary | ICD-10-CM | POA: Diagnosis not present

## 2018-08-06 DIAGNOSIS — I480 Paroxysmal atrial fibrillation: Secondary | ICD-10-CM | POA: Diagnosis not present

## 2018-08-06 LAB — POCT INR
INR: 2.3 (ref 2.0–3.0)
INR: 2.3 (ref 2.0–3.0)

## 2018-08-06 MED ORDER — DILTIAZEM HCL ER COATED BEADS 120 MG PO CP24: 120.0000 mg | ORAL_CAPSULE | Freq: Every day | ORAL | 2 refills | Status: DC

## 2018-09-09 ENCOUNTER — Telehealth: Payer: Self-pay

## 2018-09-09 NOTE — Telephone Encounter (Signed)
Called patient.  [name] wished to cancel appointment for next Tuesday, 09/15/18. Advised her that it would be August 2020 before in-office visits will be scheduled. Patient verbalized understanding. Appointment cancelled and recall entered for August 2020.

## 2018-09-14 ENCOUNTER — Telehealth: Payer: Self-pay

## 2018-09-14 NOTE — Telephone Encounter (Signed)

## 2018-09-15 ENCOUNTER — Other Ambulatory Visit: Payer: Self-pay

## 2018-09-15 ENCOUNTER — Ambulatory Visit (INDEPENDENT_AMBULATORY_CARE_PROVIDER_SITE_OTHER): Payer: Medicare Other | Admitting: Pharmacist Clinician (PhC)/ Clinical Pharmacy Specialist

## 2018-09-15 ENCOUNTER — Ambulatory Visit: Payer: Medicare Other | Admitting: Cardiovascular Disease

## 2018-09-15 DIAGNOSIS — I480 Paroxysmal atrial fibrillation: Secondary | ICD-10-CM

## 2018-09-15 DIAGNOSIS — Z7901 Long term (current) use of anticoagulants: Secondary | ICD-10-CM

## 2018-09-15 LAB — POCT INR: INR: 4.5 — AB (ref 2.0–3.0)

## 2018-09-28 ENCOUNTER — Telehealth: Payer: Self-pay | Admitting: Cardiovascular Disease

## 2018-09-28 NOTE — Telephone Encounter (Signed)
  Patient is wondering when she needs to come back to check her coumadin

## 2018-09-28 NOTE — Telephone Encounter (Signed)
Patient due to INR check tomorrow. Unsure why she's not on the schedule. Scheduled patient for INR check tomorrow between 10-11AM  1. Do you currently have a fever? No (yes = cancel and refer to pcp for e-visit) 2. Have you recently travelled on a cruise, internationally, or to [place], [place], [place], [place], [place], or [place], [place] [employer]) ? No (yes = cancel, stay home, monitor symptoms, and contact pcp or initiate e-visit if symptoms develop) 3. Have you been in contact with someone that is currently pending confirmation of Covid19 testing or has been confirmed to have the [place] virus?  No (yes = cancel, stay home, away from tested individual, monitor symptoms, and contact pcp or initiate e-visit if symptoms develop) 4. Are you currently experiencing fatigue or cough? no (yes = pt should be prepared to have a mask placed at the time of their visit).  Pt. Advised that we are restricting visitors at this time and anyone present in the vehicle should meet the above criteria as well. Advised that visit will be at curbside for finger stick ONLY and will receive call with instructions. Pt also advised to please bring own pen for signature of arrival document.

## 2018-09-29 ENCOUNTER — Other Ambulatory Visit: Payer: Self-pay

## 2018-09-29 ENCOUNTER — Ambulatory Visit (INDEPENDENT_AMBULATORY_CARE_PROVIDER_SITE_OTHER): Payer: Medicare Other | Admitting: *Deleted

## 2018-09-29 DIAGNOSIS — I480 Paroxysmal atrial fibrillation: Secondary | ICD-10-CM

## 2018-09-29 DIAGNOSIS — Z7901 Long term (current) use of anticoagulants: Secondary | ICD-10-CM

## 2018-09-29 LAB — POCT INR: INR: 3.4 — AB (ref 2.0–3.0)

## 2018-10-12 ENCOUNTER — Telehealth: Payer: Self-pay

## 2018-10-12 NOTE — Telephone Encounter (Signed)

## 2018-10-13 ENCOUNTER — Ambulatory Visit (INDEPENDENT_AMBULATORY_CARE_PROVIDER_SITE_OTHER): Payer: Medicare Other | Admitting: Pharmacist Clinician (PhC)/ Clinical Pharmacy Specialist

## 2018-10-13 ENCOUNTER — Other Ambulatory Visit: Payer: Self-pay

## 2018-10-13 DIAGNOSIS — Z7901 Long term (current) use of anticoagulants: Secondary | ICD-10-CM | POA: Diagnosis not present

## 2018-10-13 DIAGNOSIS — I480 Paroxysmal atrial fibrillation: Secondary | ICD-10-CM

## 2018-10-13 LAB — POCT INR: INR: 1.3 — AB (ref 2.0–3.0)

## 2018-10-27 ENCOUNTER — Telehealth: Payer: Self-pay

## 2018-10-27 NOTE — Telephone Encounter (Signed)

## 2018-10-29 NOTE — Telephone Encounter (Signed)

## 2018-11-01 ENCOUNTER — Other Ambulatory Visit: Payer: Self-pay | Admitting: Cardiovascular Disease

## 2018-11-05 ENCOUNTER — Other Ambulatory Visit: Payer: Self-pay

## 2018-11-05 ENCOUNTER — Ambulatory Visit (INDEPENDENT_AMBULATORY_CARE_PROVIDER_SITE_OTHER): Payer: Medicare Other | Admitting: Pharmacist

## 2018-11-05 DIAGNOSIS — I480 Paroxysmal atrial fibrillation: Secondary | ICD-10-CM | POA: Diagnosis not present

## 2018-11-05 DIAGNOSIS — Z7901 Long term (current) use of anticoagulants: Secondary | ICD-10-CM | POA: Diagnosis not present

## 2018-11-05 LAB — POCT INR
INR: 2.3 (ref 2.0–3.0)
INR: 2.3 (ref 2.0–3.0)

## 2018-11-21 ENCOUNTER — Other Ambulatory Visit: Payer: Self-pay | Admitting: Cardiovascular Disease

## 2018-11-30 ENCOUNTER — Telehealth: Payer: Self-pay

## 2018-11-30 NOTE — Telephone Encounter (Signed)

## 2018-12-04 ENCOUNTER — Ambulatory Visit (INDEPENDENT_AMBULATORY_CARE_PROVIDER_SITE_OTHER): Payer: Medicare Other | Admitting: *Deleted

## 2018-12-04 ENCOUNTER — Other Ambulatory Visit: Payer: Self-pay

## 2018-12-04 DIAGNOSIS — I480 Paroxysmal atrial fibrillation: Secondary | ICD-10-CM

## 2018-12-04 DIAGNOSIS — Z7901 Long term (current) use of anticoagulants: Secondary | ICD-10-CM

## 2018-12-04 LAB — POCT INR: INR: 3.7 — AB (ref 2.0–3.0)

## 2018-12-04 NOTE — Patient Instructions (Signed)
Description   Skip today's dose, then continue taking 1/2 tablet daily except 1 tablet on Tuesdays, Thursdays and Saturdays. Repeat INR in 2 weeks

## 2018-12-10 ENCOUNTER — Telehealth: Payer: Self-pay | Admitting: Hematology and Oncology

## 2018-12-10 NOTE — Telephone Encounter (Signed)
[place] 8/26 moved lab/fu to 8/28. Confirmed with patient.

## 2018-12-15 ENCOUNTER — Telehealth: Payer: Self-pay

## 2018-12-15 NOTE — Telephone Encounter (Signed)
Unable to lmom for prescreen a constant ring

## 2018-12-17 ENCOUNTER — Other Ambulatory Visit: Payer: Self-pay

## 2018-12-17 ENCOUNTER — Ambulatory Visit (INDEPENDENT_AMBULATORY_CARE_PROVIDER_SITE_OTHER): Payer: Medicare Other | Admitting: Pharmacist

## 2018-12-17 DIAGNOSIS — Z7901 Long term (current) use of anticoagulants: Secondary | ICD-10-CM | POA: Diagnosis not present

## 2018-12-17 DIAGNOSIS — I480 Paroxysmal atrial fibrillation: Secondary | ICD-10-CM | POA: Diagnosis not present

## 2018-12-17 LAB — POCT INR: INR: 2 (ref 2.0–3.0)

## 2018-12-22 ENCOUNTER — Other Ambulatory Visit: Payer: Self-pay | Admitting: Family Medicine

## 2018-12-22 NOTE — Telephone Encounter (Signed)
Does pt need to come for appt . Last tsh was in November of last year. Please advise [facility]

## 2019-01-15 NOTE — Assessment & Plan Note (Signed)
Right axillary lymphadenopathy: Detected through with routine screening mammogram status post ultrasound-guided biopsy, 8 mm lymph nodes, atypical lymphoproliferative process suspicious for lymphoma, flow cytometry revealed excess CD4 positive cells suspicious for angioimmunoblastic T-cell lymphoma CT scans 03/29/15: Borderline bil axillary LN and inguinal LN Ct scans 07/06/15: Progressive bil Axillary and inguinal lymphadenopathy  Hospitalization: 11/19 to 04/19/15: Closed Rt Hip Fracture Hospitalization August 2019: Rotavirus infection  Plan:  1. Given [name] advanced age, I did not recommend aggressive chemotherapy. 2. Options were surveillance versus prednisone therapy: Patient preferred surveillance  Return to clinic in1 yearwith CBC, CMP and LDH

## 2019-01-20 ENCOUNTER — Ambulatory Visit (INDEPENDENT_AMBULATORY_CARE_PROVIDER_SITE_OTHER): Payer: Medicare Other | Admitting: Cardiovascular Disease

## 2019-01-20 ENCOUNTER — Ambulatory Visit (INDEPENDENT_AMBULATORY_CARE_PROVIDER_SITE_OTHER): Payer: Medicare Other | Admitting: Pharmacist Clinician (PhC)/ Clinical Pharmacy Specialist

## 2019-01-20 ENCOUNTER — Encounter: Payer: Self-pay | Admitting: Cardiovascular Disease

## 2019-01-20 ENCOUNTER — Other Ambulatory Visit: Payer: Self-pay

## 2019-01-20 ENCOUNTER — Other Ambulatory Visit: Payer: Medicare Other

## 2019-01-20 ENCOUNTER — Ambulatory Visit: Payer: Medicare Other | Admitting: Hematology and Oncology

## 2019-01-20 VITALS — BP 104/58 | HR 54 | Temp 97.4°F | Ht 63.0 in | Wt 122.8 lb

## 2019-01-20 DIAGNOSIS — I480 Paroxysmal atrial fibrillation: Secondary | ICD-10-CM | POA: Diagnosis not present

## 2019-01-20 DIAGNOSIS — I10 Essential (primary) hypertension: Secondary | ICD-10-CM

## 2019-01-20 DIAGNOSIS — E7800 Pure hypercholesterolemia, unspecified: Secondary | ICD-10-CM

## 2019-01-20 DIAGNOSIS — Z7901 Long term (current) use of anticoagulants: Secondary | ICD-10-CM

## 2019-01-20 DIAGNOSIS — I350 Nonrheumatic aortic (valve) stenosis: Secondary | ICD-10-CM

## 2019-01-20 DIAGNOSIS — I5032 Chronic diastolic (congestive) heart failure: Secondary | ICD-10-CM

## 2019-01-20 DIAGNOSIS — I4821 Permanent atrial fibrillation: Secondary | ICD-10-CM

## 2019-01-20 DIAGNOSIS — I2721 Secondary pulmonary arterial hypertension: Secondary | ICD-10-CM

## 2019-01-20 DIAGNOSIS — I2510 Atherosclerotic heart disease of native coronary artery without angina pectoris: Secondary | ICD-10-CM

## 2019-01-20 LAB — COMPREHENSIVE METABOLIC PANEL
ALT: 20 IU/L (ref 0–32)
AST: 21 IU/L (ref 0–40)
Albumin/Globulin Ratio: 1.4 (ref 1.2–2.2)
Albumin: 4.2 g/dL (ref 3.5–4.6)
Alkaline Phosphatase: 77 IU/L (ref 39–117)
BUN/Creatinine Ratio: 21 (ref 12–28)
BUN: 30 mg/dL (ref 10–36)
Bilirubin Total: 1 mg/dL (ref 0.0–1.2)
CO2: 21 mmol/L (ref 20–29)
Calcium: 9.9 mg/dL (ref 8.7–10.3)
Chloride: 105 mmol/L (ref 96–106)
Creatinine, Ser: 1.41 mg/dL — ABNORMAL HIGH (ref 0.57–1.00)
GFR calc Af Amer: 37 mL/min/{1.73_m2} — ABNORMAL LOW (ref >59)
GFR calc non Af Amer: 32 mL/min/{1.73_m2} — ABNORMAL LOW (ref >59)
Globulin, Total: 2.9 g/dL (ref 1.5–4.5)
Glucose: 79 mg/dL (ref 65–99)
Potassium: 5.6 mmol/L — ABNORMAL HIGH (ref 3.5–5.2)
Sodium: 140 mmol/L (ref 134–144)
Total Protein: 7.1 g/dL (ref 6.0–8.5)

## 2019-01-20 LAB — LIPID PANEL
Chol/HDL Ratio: 2.9 ratio (ref 0.0–4.4)
Cholesterol, Total: 120 mg/dL (ref 100–199)
HDL: 42 mg/dL (ref >39)
LDL Calculated: 60 mg/dL (ref 0–99)
Triglycerides: 92 mg/dL (ref 0–149)
VLDL Cholesterol Cal: 18 mg/dL (ref 5–40)

## 2019-01-20 LAB — POCT INR: INR: 1.7 — AB (ref 2.0–3.0)

## 2019-01-20 NOTE — Patient Instructions (Signed)
Medication Instructions:  STOP the Diltiazem  If you need a refill on your cardiac medications before your next appointment, please call your pharmacy.   Lab work: Your provider would like for you to have the following labs today: Lipid and CMET  If you have labs (blood work) drawn today and your tests are completely normal, you will receive your results only by: Marland Kitchen MyChart Message (if you have MyChart) OR . A paper copy in the mail If you have any lab test that is abnormal or we need to change your treatment, we will call you to review the results.  Testing/Procedures: None ordered  Follow-Up: At [facility], you and your health needs are our priority.  As part of our continuing mission to provide you with exceptional heart care, we have created designated Provider Care Teams.  These Care Teams include your primary Cardiologist (physician) and Advanced Practice Providers (APPs -  Physician Assistants and Nurse Practitioners) who all work together to provide you with the care you need, when you need it. You will need a follow up appointment in 12 months.  Please call our office 2 months in advance to schedule this appointment.  You may see Sanda Klein, MD or one of the following Advanced Practice Providers on your designated Care Team: [place], [place] . Fabian Sharp, PA-C

## 2019-01-20 NOTE — Patient Instructions (Signed)
Take 1 tablet today Wednesday Aug 26, then continue taking 1/2 tablet daily except 1 tablet on Tuesdays, Thursdays and Saturdays. Repeat INR in 3 weeks.

## 2019-01-20 NOTE — Progress Notes (Signed)
Cardiology Office Note    Date:  01/20/2019   ID:  SHARAE Evans, DOB 1924-03-04, MRN [id number]  PCP:  [name], MD  Cardiologist:   Sanda Klein, MD   Chief Complaint  Patient presents with  . Congestive Heart Failure  . Atrial Fibrillation    History of Present Illness:  [name] is a 85 y.o. female with chronic diastolic heart failure, coronary artery disease, moderate aortic stenosis, persistent atrial fibrillation and hyperlipidemia returning for follow-up visit.  This is her first office visit since her husband [name] passed away about 3 months ago.  She is still living alone in their old home.  Her son helps with grocery shopping.    She denies dyspnea and edema. She is no spironolactone 50 mg daily and wears compression stockings. She is allergic to both thiazide and loop diuretics (severe skin rash and pruritus) and had intractable nausea with ethacrynic acid, so spironolactone is the only diuretic she is been able to take.   She is in permanent atrial fibrillation with good rate control and is unaware of any palpitations.  She does not have exertional dyspnea, orthopnea, PND.  She denies angina pectoris.  She has not had syncope or dizziness.  She is chronically anticoagulated without any serious bleeding problems, falls or injuries.  She did drop a heavy plastic cup from the cupboard on her nose and has a small facial ecchymosis.  Her low-grade lymphoma is still being treated with watchful waiting.  She has coronary artery disease and received a bare-metal stent (3x18 mm integrity) to the right coronary in 2010. She has not had significant angina pectoris since.  She has diastolic heart failure with a remote acute exacerbation in June 2014 and March 2016. Her echo shows an ejection fraction of 55-60% and pseudo-normal mitral inflow.  She has moderate aortic stenosis (estimated valve area 1.2 cm square, mean gradient 18 mm Hg, echo Sep 2016). She had  recurrent persistent atrial fibrillation requiring multiple cardioversions, failed amiodarone therapy. She has treated hyperlipidemia, hypertension and hypothyroidism. 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  Past Medical History:  Diagnosis Date  .  Acute respiratiory failure requiring BiPap 06/24/2011  . Allergy   . Aortic valvular stenosis, moderate to severe 06/24/2011   a. 10/2012 Echo: mod AS, Valve 1.25 cm^2 (VTI), 1.19cm^2 (Vmax).  . Arthritis    "back" (10/30/2012)  . Chronic diastolic CHF (congestive heart failure) ([place])    a. 10/2012 Echo: EF 55-60%, no rwma, Gr 2 DD, moderate AS, mod dil LA, mildly to mod dil RA.  Marland Kitchen Coronary artery disease    a. 03/2009 PCI RCA (3.0x18 BMS).  . Dyslipidemia   . Exertional shortness of breath   . Hypertension   . Hypothyroidism   . PAF (paroxysmal atrial fibrillation) ([place])    a. CHA2DS2VASc = 7-->chronic coumadin;  b. s/p DCCV 05/2011, 05/2012, 07/2014;  c. On amio.  . Rash    a. felt to be 2/2 lasix/torsemide in setting of sulfa allergy (lasix d/c ~ 06/2014, torsemide d/c 08/09/2014).    Past Surgical History:  Procedure Laterality Date  . ANGIOPLASTY    . APPENDECTOMY    . BACK SURGERY    . CARDIAC CATHETERIZATION  10/06/2009   Continue medical therapy  . CARDIAC CATHETERIZATION  04/07/2009   RCA stented with a 3x36mm stent resulting in a reduction of 90% narrowing to normal  . CARDIOVERSION  06/19/2011   Procedure: CARDIOVERSION;  Surgeon:  Sanda Klein, MD;  Location: [facility] OR;  Service: Cardiovascular;  Laterality: N/A;  . CARDIOVERSION  07/11/2010   Successful coversion to sinus rhythm  . CARDIOVERSION N/A 08/10/2014   Procedure: CARDIOVERSION;  Surgeon: Sanda Klein, MD;  Location: [facility];  Service: Cardiovascular;  Laterality: N/A;  . CATARACT EXTRACTION W/ INTRAOCULAR LENS  IMPLANT, BILATERAL    . CORONARY ANGIOPLASTY WITH STENT PLACEMENT     "1" (10/30/2012)  . DILATION AND CURETTAGE OF UTERUS     . EXCISIONAL HEMORRHOIDECTOMY    . FRACTURE SURGERY    . HIP ARTHROPLASTY Right 04/17/2015   Procedure: HIP MONOPOLAR HIP HEMI ARTHROPLASTY;  Surgeon: Marybelle Killings, MD;  Location: [facility] [facility];  Service: Orthopedics;  Laterality: Right;  . IR THORACENTESIS ASP PLEURAL SPACE W/IMG GUIDE  04/28/2018  . IR THORACENTESIS ASP PLEURAL SPACE W/IMG GUIDE  05/26/2018  . LUMBAR DISC SURGERY  X 2   "cleaned out scar tissue"   . SHOULDER ACROMIOPLASTY Left    "fell; put a new ball in" (10/30/2012)  . WRIST FRACTURE SURGERY Left     Current Medications: Outpatient Medications Prior to Visit  Medication Sig Dispense Refill  . acetaminophen (TYLENOL) 650 MG CR tablet Take 650 mg by mouth 3 (three) times daily. Morning, supper and bedtime    . atorvastatin (LIPITOR) 40 MG tablet TAKE 1 TABLET DAILY AT 6 P.M. 90 tablet 0  . augmented betamethasone dipropionate (DIPROLENE-AF) 0.05 % cream     . Cholecalciferol (VITAMIN D) 50 MCG (2000 UT) tablet Take 2,000 Units by mouth daily.    . diphenhydramine-acetaminophen (ACETAMINOPHEN PM) 25-500 MG TABS tablet Take 1 tablet by mouth at bedtime as needed.    . ezetimibe (ZETIA) 10 MG tablet TAKE 1 TABLET DAILY FOR HLD (Patient taking differently: Take 10 mg by mouth daily. ) 90 tablet 3  . folic acid (FOLVITE) 1 MG tablet TAKE 2 TABLETS DAILY (Patient taking differently: Take 2 mg by mouth daily. ) 180 tablet 4  . levothyroxine (SYNTHROID) 88 MCG tablet TAKE 1 TABLET DAILY BEFORE BREAKFAST 90 tablet 1  . loratadine (CLARITIN) 10 MG tablet Take 10 mg by mouth daily.    . magnesium hydroxide (MILK OF MAGNESIA) 400 MG/5ML suspension Take 15 mLs by mouth daily as needed for mild constipation. 360 mL 0  . metoprolol tartrate (LOPRESSOR) 25 MG tablet TAKE 1 TABLET TWICE A DAY (Patient taking differently: Take 25 mg by mouth 2 (two) times daily. ) 180 tablet 4  . multivitamin-iron-minerals-folic acid (CENTRUM) chewable tablet Chew 1 tablet by mouth daily.    Marland Kitchen OVER THE COUNTER  MEDICATION Take 1 tablet by mouth See admin instructions. Stool softener over the counter by mouth daily    . OVER THE COUNTER MEDICATION Take 1 tablet by mouth See admin instructions. Over the counter pill for osteoarthritis by mouth three time daily    . spironolactone (ALDACTONE) 25 MG tablet Take 2 tablets  ( 50 mg ) daily 180 tablet 3  . vitamin C (ASCORBIC ACID) 500 MG tablet Take 500 mg by mouth daily.    Marland Kitchen warfarin (COUMADIN) 2.5 MG tablet TAKE 1 TABLET DAILY OR AS DIRECTED BY COUMADIN CLINIC 90 tablet 1  . diltiazem (CARDIZEM CD) 120 MG 24 hr capsule Take 1 capsule (120 mg total) by mouth daily. 90 capsule 2  . ipratropium (ATROVENT) 0.03 % nasal spray Place 2 sprays into both nostrils 2 (two) times daily. Use as directed (Patient not taking: Reported on 01/20/2019) 30 mL  5   No facility-administered medications prior to visit.      Allergies:   [name] [ethacrynic acid], Furosemide, Sulfa antibiotics, Tape, [name], and Torsemide   Social History   Socioeconomic History  . Marital status: Married    Spouse name: Not on file  . Number of children: 1  . Years of education: Not on file  . Highest education level: Not on file  Occupational History  . Occupation: Retired    Fish farm manager: [employer]  Social Needs  . Financial resource strain: Not on file  . Food insecurity    Worry: Not on file    Inability: Not on file  . Transportation needs    Medical: Not on file    Non-medical: Not on file  Tobacco Use  . Smoking status: Never Smoker  . Smokeless tobacco: Never Used  Substance and Sexual Activity  . Alcohol use: No  . Drug use: No  . Sexual activity: Never    Birth control/protection: Abstinence  Lifestyle  . Physical activity    Days per week: Not on file    Minutes per session: Not on file  . Stress: Not on file  Relationships  . Social Herbalist on phone: Not on file    Gets together: Not on file    Attends religious service:  Not on file    Active member of club or organization: Not on file    Attends meetings of clubs or organizations: Not on file    Relationship status: Not on file  Other Topics Concern  . Not on file  Social History Narrative   Lives in [place], [place]. She got married in 1948.     Family History:  The patient's family history includes Alcohol abuse in her child; Cancer in her son; Cancer (age of onset: 42) in her mother; Heart attack in her father; Heart disease in her brother, brother, and father; Heart disease (age of onset: 82) in her mother; Heart failure in her mother; Hypertension in her brother, father, mother, and sister; Hypertension (age of onset: 34) in her child; Lung disease in her brother; Stroke (age of onset: 73) in her mother.   ROS:   Please see the history of present illness.    ROS All other systems reviewed and are negative.   PHYSICAL EXAM:   VS:  BP (!) 104/58   Pulse (!) 54   Temp (!) 97.4 F (36.3 C)   Ht 5\' 3"  (1.6 m)   Wt 122 lb 12.8 oz (55.7 kg)   SpO2 99%   BMI 21.75 kg/m     General: Alert, oriented x3, no distress, lean, smiling. Head: no evidence of trauma, PERRL, EOMI, no exophtalmos or lid lag, no myxedema, no xanthelasma; normal ears, nose and oropharynx Neck: normal jugular venous pulsations and no hepatojugular reflux; brisk carotid pulses without delay and no carotid bruits Chest: clear to auscultation, no signs of consolidation by percussion or palpation, normal fremitus, symmetrical and full respiratory excursions Cardiovascular: normal position and quality of the apical impulse, irregular rhythm, normal first and paradoxically split second heart sounds, 2-3/6 early peaking systolic ejection murmur, no diastolic murmurs, rubs or gallops Abdomen: no tenderness or distention, no masses by palpation, no abnormal pulsatility or arterial bruits, normal bowel sounds, no hepatosplenomegaly Extremities: no clubbing, cyanosis or edema (compression  stockings); 2+ radial, ulnar and brachial pulses bilaterally; 2+ right femoral, posterior tibial and dorsalis pedis pulses; 2+ left femoral, posterior tibial and  dorsalis pedis pulses; no subclavian or femoral bruits Neurological: grossly nonfocal Psych: Normal mood and affect   Wt Readings from Last 3 Encounters:  01/20/19 122 lb 12.8 oz (55.7 kg)  06/26/18 126 lb (57.2 kg)  05/11/18 133 lb (60.3 kg)      Studies/Labs Reviewed:   EKG:  EKG is ordered today.  The ekg ordered today demonstrates atypical atrial flutter with variable AV block and relative bradycardia at 54 bpm, nonspecific intraventricular conduction delay/atypical left bundle branch block, QRS 138 ms, QTC 434 ms  Recent Labs: 04/25/2018: B Natriuretic Peptide 232.5; Magnesium 2.3; TSH 0.983 04/30/2018: Hemoglobin 9.6; Platelets 432 01/20/2019: ALT 20; BUN 30; Creatinine, Ser 1.41; Potassium 5.6; Sodium 140   Lipid Panel    Component Value Date/Time   CHOL 120 01/20/2019 0956   TRIG 92 01/20/2019 0956   HDL 42 01/20/2019 0956   CHOLHDL 2.9 01/20/2019 0956   CHOLHDL 3.9 05/10/2016 0958   VLDL 28 05/10/2016 0958   LDLCALC 60 01/20/2019 0956   ASSESSMENT:    1. Chronic diastolic heart failure ([place])   2. Long term current use of anticoagulant   3. Permanent atrial fibrillation   4. Essential hypertension   5. Aortic valve stenosis, nonrheumatic   6. PAH (pulmonary artery hypertension) ([place])   7. Coronary artery disease involving native coronary artery of native heart without angina pectoris   8. Hypercholesterolemia      PLAN:  In order of problems listed above:  1. CHF: She has normal left ventricular systolic function.  Echo assessment of diastolic function is not possible due to arrhythmia.  She is very disciplined with sodium intake and is able to manage her heart failure with spironolactone as the only diuretic.  NYHA functional class II. No overt hypervolemia.  It is possible that she has TTR amyloidosis,  but due to her advanced age and limited therapeutic options we have not explored this any further.  Labs checked today (resulted after she left the clinic) show elevated potassium and worsening creatinine level.  Will have to reduce her spironolactone back to 25 mg once daily. 2. AFib: Today seems to be in a more organized/coherent rhythm: Atypical atrial flutter.  The arrhythmia is asymptomatic and well rate controlled may be even excessively so.  We will stop the diltiazem since this may be worsening her tendency to have lower extremity edema.  She is appropriately anticoagulated for CHADSVasc 6 (age 80, gender, HTN, CHF, CAD). 3. Warfarin: Well-tolerated without bleeding problems and usually in therapeutic range. 4. AS: Asymptomatic.  Moderate by her most recent echocardiogram performed in May 2018 with a mean gradient of 23 mmHg and in December 2019 with a mean gradient of 18 mmHg. 5. PAH: Presumably this is related to diastolic left heart failure. 6. CAD: She has not had any coronary events or symptoms of angina for about 10 years.  On statin. 7. HLP: She has lost weight and without any changes in medication she is now at target LDL under 70. 8. HTN: Well-controlled 9. 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 Lindi Adie).    Medication Adjustments/Labs and Tests Ordered: Current medicines are reviewed at length with the patient today.  Concerns regarding medicines are outlined above.  Medication changes, Labs and Tests ordered today are listed in the Patient Instructions below. Patient Instructions  Medication Instructions:  STOP the Diltiazem  If you need a refill on your cardiac medications before your next appointment, please call your pharmacy.  Lab work: Your provider would like for you to have the following labs today: Lipid and CMET  If you have labs (blood work) drawn today and your tests are completely normal,  you will receive your results only by: Marland Kitchen MyChart Message (if you have MyChart) OR . A paper copy in the mail If you have any lab test that is abnormal or we need to change your treatment, we will call you to review the results.  Testing/Procedures: None ordered  Follow-Up: At [facility], you and your health needs are our priority.  As part of our continuing mission to provide you with exceptional heart care, we have created designated Provider Care Teams.  These Care Teams include your primary Cardiologist (physician) and Advanced Practice Providers (APPs -  Physician Assistants and Nurse Practitioners) who all work together to provide you with the care you need, when you need it. You will need a follow up appointment in 12 months.  Please call our office 2 months in advance to schedule this appointment.  You may see Sanda Klein, MD or one of the following Advanced Practice Providers on your designated Care Team: [place], [place] . Fabian Sharp, PA-C      Signed, Sanda Klein, MD  01/20/2019 6:51 PM    [address], [place], [place]  [address] Phone: [phone]; Fax: [phone]

## 2019-01-21 ENCOUNTER — Telehealth: Payer: Self-pay | Admitting: *Deleted

## 2019-01-21 ENCOUNTER — Other Ambulatory Visit: Payer: Self-pay | Admitting: *Deleted

## 2019-01-21 DIAGNOSIS — C8440 Peripheral T-cell lymphoma, not classified, unspecified site: Secondary | ICD-10-CM

## 2019-01-21 DIAGNOSIS — E875 Hyperkalemia: Secondary | ICD-10-CM

## 2019-01-21 MED ORDER — SPIRONOLACTONE 25 MG PO TABS: 25.0000 mg | ORAL_TABLET | Freq: Every day | ORAL | 3 refills | Status: DC

## 2019-01-21 NOTE — Progress Notes (Signed)
Patient Care Team: [name], MD as PCP - General (Family Medicine) Sanda Klein, MD as PCP - Cardiology (Cardiology)  DIAGNOSIS:    ICD-10-CM   1. Angioimmunoblastic lymphoma ([place])  C84.40     SUMMARY OF ONCOLOGIC HISTORY: Oncology History  Angioimmunoblastic lymphoma ([place])  03/16/2015 Initial Diagnosis   Left x-ray lymph node biopsy: Atypical lymphoid proliferation suspicious for lymphoma, flow cytometry revealed T cells CD3, CD43 and CD5 associated with BCL-2 expression, suspicious for angioimmunoblastic T-cell lymphoma   03/29/2015 Imaging    Borderline bil axillary LN and inguinal LN   04/15/2015 - 04/19/2015 Hospital Admission   Closed Rt Hip Fracture   07/06/2015 Imaging   Progressive bil Axillary and inguinal lymphadenopathy      CHIEF COMPLIANT: Follow-up of history of angioimmunoblastic lymphoma  INTERVAL HISTORY: [name] is a 85 y.o. with above-mentioned history of angioblastic lymphoma currently on surveillance. I last saw her a year ago. In the interim, she was admitted from 04/25/18-05/01/18 for a right pleural effusion. She presents to the clinic today for annual follow-up.   REVIEW OF SYSTEMS:   Constitutional: Denies fevers, chills or abnormal weight loss Eyes: Denies blurriness of vision Ears, nose, mouth, throat, and face: Denies mucositis or sore throat Respiratory: Denies cough, dyspnea or wheezes Cardiovascular: Denies palpitation, chest discomfort Gastrointestinal: Denies nausea, heartburn or change in bowel habits Skin: Denies abnormal skin rashes Lymphatics: Denies new lymphadenopathy or easy bruising Neurological: Denies numbness, tingling or new weaknesses Behavioral/Psych: Mood is stable, no new changes  Extremities: No lower extremity edema Breast: denies any pain or lumps or nodules in either breasts All other systems were reviewed with the patient and are negative.  I have reviewed the past medical history, past surgical  history, social history and family history with the patient and they are unchanged from previous note.  ALLERGIES:  is allergic to edecrin [ethacrynic acid]; furosemide; sulfa antibiotics; tape; thiazide-type diuretics; and torsemide.  MEDICATIONS:  Current Outpatient Medications  Medication Sig Dispense Refill  . acetaminophen (TYLENOL) 650 MG CR tablet Take 650 mg by mouth 3 (three) times daily. Morning, supper and bedtime    . atorvastatin (LIPITOR) 40 MG tablet TAKE 1 TABLET DAILY AT 6 P.M. 90 tablet 0  . augmented betamethasone dipropionate (DIPROLENE-AF) 0.05 % cream     . Cholecalciferol (VITAMIN D) 50 MCG (2000 UT) tablet Take 2,000 Units by mouth daily.    . diphenhydramine-acetaminophen (ACETAMINOPHEN PM) 25-500 MG TABS tablet Take 1 tablet by mouth at bedtime as needed.    . ezetimibe (ZETIA) 10 MG tablet TAKE 1 TABLET DAILY FOR HLD (Patient taking differently: Take 10 mg by mouth daily. ) 90 tablet 3  . folic acid (FOLVITE) 1 MG tablet TAKE 2 TABLETS DAILY (Patient taking differently: Take 2 mg by mouth daily. ) 180 tablet 4  . ipratropium (ATROVENT) 0.03 % nasal spray Place 2 sprays into both nostrils 2 (two) times daily. Use as directed (Patient not taking: Reported on 01/20/2019) 30 mL 5  . levothyroxine (SYNTHROID) 88 MCG tablet TAKE 1 TABLET DAILY BEFORE BREAKFAST 90 tablet 1  . loratadine (CLARITIN) 10 MG tablet Take 10 mg by mouth daily.    . magnesium hydroxide (MILK OF MAGNESIA) 400 MG/5ML suspension Take 15 mLs by mouth daily as needed for mild constipation. 360 mL 0  . metoprolol tartrate (LOPRESSOR) 25 MG tablet TAKE 1 TABLET TWICE A DAY (Patient taking differently: Take 25 mg by mouth 2 (two) times daily. ) 180 tablet  4  . multivitamin-iron-minerals-folic acid (CENTRUM) chewable tablet Chew 1 tablet by mouth daily.    Marland Kitchen OVER THE COUNTER MEDICATION Take 1 tablet by mouth See admin instructions. Stool softener over the counter by mouth daily    . OVER THE COUNTER MEDICATION  Take 1 tablet by mouth See admin instructions. Over the counter pill for osteoarthritis by mouth three time daily    . spironolactone (ALDACTONE) 25 MG tablet Take 1 tablet (25 mg total) by mouth daily. 180 tablet 3  . vitamin C (ASCORBIC ACID) 500 MG tablet Take 500 mg by mouth daily.    Marland Kitchen warfarin (COUMADIN) 2.5 MG tablet TAKE 1 TABLET DAILY OR AS DIRECTED BY COUMADIN CLINIC 90 tablet 1   No current facility-administered medications for this visit.     PHYSICAL EXAMINATION: ECOG PERFORMANCE STATUS: 1 - Symptomatic but completely ambulatory  Vitals:   01/22/19 1030  BP: 116/72  Pulse: 90  Resp: 17  Temp: 98.7 F (37.1 C)  SpO2: 100%   Filed Weights   01/22/19 1030  Weight: 123 lb 12.8 oz (56.2 kg)    GENERAL: alert, no distress and comfortable SKIN: skin color, texture, turgor are normal, no rashes or significant lesions EYES: normal, Conjunctiva are pink and non-injected, sclera clear OROPHARYNX: no exudate, no erythema and lips, buccal mucosa, and tongue normal  NECK: supple, thyroid normal size, non-tender, without nodularity LYMPH: no palpable lymphadenopathy in the cervical, axillary or inguinal LUNGS: clear to auscultation and percussion with normal breathing effort HEART: regular rate & rhythm and no murmurs and no lower extremity edema ABDOMEN: abdomen soft, non-tender and normal bowel sounds MUSCULOSKELETAL: no cyanosis of digits and no clubbing  NEURO: alert & oriented x 3 with fluent speech, no focal motor/sensory deficits EXTREMITIES: No lower extremity edema  LABORATORY DATA:  I have reviewed the data as listed CMP Latest Ref Rng & Units 01/20/2019 05/07/2018 05/01/2018  Glucose 65 - 99 mg/dL 79 119(H) 113(H)  BUN 10 - 36 mg/dL 30 25 30(H)  Creatinine 0.57 - 1.00 mg/dL 1.41(H) 0.95 0.96  Sodium 134 - 144 mmol/L 140 141 141  Potassium 3.5 - 5.2 mmol/L 5.6(H) 4.6 3.8  Chloride 96 - 106 mmol/L 105 103 107  CO2 20 - 29 mmol/L 21 26 24   Calcium 8.7 - 10.3 mg/dL  9.9 8.7 8.6(L)  Total Protein 6.0 - 8.5 g/dL 7.1 - -  Total Bilirubin 0.0 - 1.2 mg/dL 1.0 - -  Alkaline Phos 39 - 117 IU/L 77 - -  AST 0 - 40 IU/L 21 - -  ALT 0 - 32 IU/L 20 - -    Lab Results  Component Value Date   WBC 6.8 01/22/2019   HGB 12.0 01/22/2019   HCT 37.9 01/22/2019   MCV 96.2 01/22/2019   PLT 227 01/22/2019   NEUTROABS 4.7 01/22/2019    ASSESSMENT & PLAN:  Angioimmunoblastic lymphoma ([facility]) Right axillary lymphadenopathy: Detected through with routine screening mammogram status post ultrasound-guided biopsy, 8 mm lymph nodes, atypical lymphoproliferative process suspicious for lymphoma, flow cytometry revealed excess CD4 positive cells suspicious for angioimmunoblastic T-cell lymphoma CT scans 03/29/15: Borderline bil axillary LN and inguinal LN Ct scans 07/06/15: Progressive bil Axillary and inguinal lymphadenopathy  Hospitalization: 11/19 to 04/19/15: Closed Rt Hip Fracture Hospitalization August 2019: Rotavirus infection  Plan:  1. Given her advanced age, I did not recommend aggressive chemotherapy. 2. Options were surveillance versus prednisone therapy: Patient preferred surveillance  Return to clinic in1 year Given her advanced age we  decided not to perform any further imaging studies or unnecessary blood work.    No orders of the defined types were placed in this encounter.  The patient has a good understanding of the overall plan. she agrees with it. she will call with any problems that may develop before the next visit here.  Nicholas Lose, MD 01/22/2019  Julious Oka Dorshimer am acting as scribe for Dr. Nicholas Lose.  I have reviewed the above documentation for accuracy and completeness, and I agree with the above.

## 2019-01-21 NOTE — Telephone Encounter (Signed)
-----   Message from Sanda Klein, MD sent at 01/20/2019  6:01 PM EDT ----- Unfortunately both the potassium and creatinine become higher than in the past.  Please reduce spironolactone to only 25 mg once daily.  Repeat a basic metabolic panel in 1 month.  Lipid parameters and liver function tests look excellent.

## 2019-01-21 NOTE — Telephone Encounter (Signed)
Spoke with pt, Patient voiced understanding of medication change. Lab orders mailed to the pt

## 2019-01-22 ENCOUNTER — Other Ambulatory Visit: Payer: Self-pay

## 2019-01-22 ENCOUNTER — Inpatient Hospital Stay (HOSPITAL_BASED_OUTPATIENT_CLINIC_OR_DEPARTMENT_OTHER): Payer: Medicare Other | Admitting: Hematology and Oncology

## 2019-01-22 ENCOUNTER — Inpatient Hospital Stay: Payer: Medicare Other | Attending: Hematology and Oncology

## 2019-01-22 DIAGNOSIS — C865 Angioimmunoblastic T-cell lymphoma: Secondary | ICD-10-CM | POA: Insufficient documentation

## 2019-01-22 DIAGNOSIS — Z7901 Long term (current) use of anticoagulants: Secondary | ICD-10-CM | POA: Diagnosis not present

## 2019-01-22 DIAGNOSIS — C8440 Peripheral T-cell lymphoma, not classified, unspecified site: Secondary | ICD-10-CM | POA: Diagnosis not present

## 2019-01-22 DIAGNOSIS — Z79899 Other long term (current) drug therapy: Secondary | ICD-10-CM | POA: Insufficient documentation

## 2019-01-22 LAB — CMP (CANCER CENTER ONLY)
ALT: 25 U/L (ref 0–44)
AST: 26 U/L (ref 15–41)
Albumin: 3.8 g/dL (ref 3.5–5.0)
Alkaline Phosphatase: 88 U/L (ref 38–126)
Anion gap: 8 (ref 5–15)
BUN: 29 mg/dL — ABNORMAL HIGH (ref 8–23)
CO2: 25 mmol/L (ref 22–32)
Calcium: 9.5 mg/dL (ref 8.9–10.3)
Chloride: 106 mmol/L (ref 98–111)
Creatinine: 1.28 mg/dL — ABNORMAL HIGH (ref 0.44–1.00)
GFR, Est AFR Am: 42 mL/min — ABNORMAL LOW (ref >60)
GFR, Estimated: 36 mL/min — ABNORMAL LOW (ref >60)
Glucose, Bld: 87 mg/dL (ref 70–99)
Potassium: 5.2 mmol/L — ABNORMAL HIGH (ref 3.5–5.1)
Sodium: 139 mmol/L (ref 135–145)
Total Bilirubin: 1 mg/dL (ref 0.3–1.2)
Total Protein: 7.5 g/dL (ref 6.5–8.1)

## 2019-01-22 LAB — CBC WITH DIFFERENTIAL (CANCER CENTER ONLY)
Abs Immature Granulocytes: 0.02 10*3/uL (ref 0.00–0.07)
Basophils Absolute: 0.1 10*3/uL (ref 0.0–0.1)
Basophils Relative: 1 %
Eosinophils Absolute: 0.1 10*3/uL (ref 0.0–0.5)
Eosinophils Relative: 2 %
HCT: 37.9 % (ref 36.0–46.0)
Hemoglobin: 12 g/dL (ref 12.0–15.0)
Immature Granulocytes: 0 %
Lymphocytes Relative: 18 %
Lymphs Abs: 1.2 10*3/uL (ref 0.7–4.0)
MCH: 30.5 pg (ref 26.0–34.0)
MCHC: 31.7 g/dL (ref 30.0–36.0)
MCV: 96.2 fL (ref 80.0–100.0)
Monocytes Absolute: 0.7 10*3/uL (ref 0.1–1.0)
Monocytes Relative: 11 %
Neutro Abs: 4.7 10*3/uL (ref 1.7–7.7)
Neutrophils Relative %: 68 %
Platelet Count: 227 10*3/uL (ref 150–400)
RBC: 3.94 MIL/uL (ref 3.87–5.11)
RDW: 12.5 % (ref 11.5–15.5)
WBC Count: 6.8 10*3/uL (ref 4.0–10.5)
nRBC: 0 % (ref 0.0–0.2)

## 2019-01-25 ENCOUNTER — Telehealth: Payer: Self-pay | Admitting: Hematology and Oncology

## 2019-01-25 NOTE — Telephone Encounter (Signed)
I talk with patient regarding schedule  

## 2019-02-10 ENCOUNTER — Other Ambulatory Visit: Payer: Self-pay

## 2019-02-10 ENCOUNTER — Ambulatory Visit (INDEPENDENT_AMBULATORY_CARE_PROVIDER_SITE_OTHER): Payer: Medicare Other | Admitting: Pharmacist

## 2019-02-10 DIAGNOSIS — Z7901 Long term (current) use of anticoagulants: Secondary | ICD-10-CM

## 2019-02-10 DIAGNOSIS — I480 Paroxysmal atrial fibrillation: Secondary | ICD-10-CM

## 2019-02-10 LAB — POCT INR: INR: 1.9 — AB (ref 2.0–3.0)

## 2019-02-10 LAB — BASIC METABOLIC PANEL
BUN/Creatinine Ratio: 20 (ref 12–28)
BUN: 23 mg/dL (ref 10–36)
CO2: 21 mmol/L (ref 20–29)
Calcium: 9.6 mg/dL (ref 8.7–10.3)
Chloride: 107 mmol/L — ABNORMAL HIGH (ref 96–106)
Creatinine, Ser: 1.17 mg/dL — ABNORMAL HIGH (ref 0.57–1.00)
GFR calc Af Amer: 46 mL/min/{1.73_m2} — ABNORMAL LOW (ref >59)
GFR calc non Af Amer: 40 mL/min/{1.73_m2} — ABNORMAL LOW (ref >59)
Glucose: 81 mg/dL (ref 65–99)
Potassium: 4.8 mmol/L (ref 3.5–5.2)
Sodium: 142 mmol/L (ref 134–144)

## 2019-02-19 ENCOUNTER — Other Ambulatory Visit: Payer: Self-pay | Admitting: Cardiovascular Disease

## 2019-03-03 ENCOUNTER — Other Ambulatory Visit: Payer: Self-pay

## 2019-03-03 ENCOUNTER — Ambulatory Visit (INDEPENDENT_AMBULATORY_CARE_PROVIDER_SITE_OTHER): Payer: Medicare Other | Admitting: Pharmacist Clinician (PhC)/ Clinical Pharmacy Specialist

## 2019-03-03 DIAGNOSIS — I480 Paroxysmal atrial fibrillation: Secondary | ICD-10-CM

## 2019-03-03 DIAGNOSIS — Z7901 Long term (current) use of anticoagulants: Secondary | ICD-10-CM | POA: Diagnosis not present

## 2019-03-03 LAB — POCT INR: INR: 4.5 — AB (ref 2.0–3.0)

## 2019-03-13 ENCOUNTER — Other Ambulatory Visit: Payer: Self-pay | Admitting: Cardiovascular Disease

## 2019-03-22 ENCOUNTER — Other Ambulatory Visit: Payer: Self-pay

## 2019-03-22 ENCOUNTER — Ambulatory Visit (INDEPENDENT_AMBULATORY_CARE_PROVIDER_SITE_OTHER): Payer: Medicare Other | Admitting: Pharmacist

## 2019-03-22 DIAGNOSIS — Z7901 Long term (current) use of anticoagulants: Secondary | ICD-10-CM

## 2019-03-22 DIAGNOSIS — I480 Paroxysmal atrial fibrillation: Secondary | ICD-10-CM

## 2019-03-22 LAB — POCT INR: INR: 1.2 — AB (ref 2.0–3.0)

## 2019-04-06 ENCOUNTER — Other Ambulatory Visit: Payer: Self-pay

## 2019-04-06 ENCOUNTER — Ambulatory Visit (INDEPENDENT_AMBULATORY_CARE_PROVIDER_SITE_OTHER): Payer: Medicare Other | Admitting: Family Medicine

## 2019-04-06 ENCOUNTER — Encounter: Payer: Self-pay | Admitting: Family Medicine

## 2019-04-06 VITALS — Wt 122.0 lb

## 2019-04-06 DIAGNOSIS — J069 Acute upper respiratory infection, unspecified: Secondary | ICD-10-CM

## 2019-04-06 NOTE — Progress Notes (Signed)
   Subjective:    Patient ID: [name], female    DOB: July 07, 1923, 85 y.o.   MRN: [id number]  HPI Documentation for virtual telephone encounter.   Interactive audio and video telecommunications were attempted between this provider and patient, however she did not have access to video capability.  We continued and completed visit with audio only. The patient was located at home. The provider was located in the office. The patient did consent to this visit and is aware of possible charges through their insurance for this visit. The other persons participating in this telemedicine service were none. Time spent on call was 5 minutes  This virtual service is not related to other E/M service within previous 7 days. She complains of a 1 day history of slight cough and raspy voice with some rhinorrhea and sinus congestion but no fever, chills, shortness of breath, earache, heart rate changes.  She has been using Coricidin D. She has not been around anybody in regard to risk of Covid  Review of Systems     Objective:   Physical Exam Alert and in no apparent distress.  Her voice appeared normal.      Assessment & Plan:  Viral URI with cough Recommend supportive care including using Robitussin-DM for the cough as well as Tylenol.  She will call if she has worsening of her symptoms.

## 2019-04-10 ENCOUNTER — Other Ambulatory Visit: Payer: Self-pay | Admitting: Cardiovascular Disease

## 2019-04-12 ENCOUNTER — Encounter: Payer: Self-pay | Admitting: Family Medicine

## 2019-04-12 ENCOUNTER — Other Ambulatory Visit: Payer: Self-pay

## 2019-04-12 ENCOUNTER — Ambulatory Visit (INDEPENDENT_AMBULATORY_CARE_PROVIDER_SITE_OTHER): Payer: Medicare Other | Admitting: Family Medicine

## 2019-04-12 VITALS — BP 126/67 | HR 85 | Temp 97.7°F | Wt 122.0 lb

## 2019-04-12 DIAGNOSIS — J069 Acute upper respiratory infection, unspecified: Secondary | ICD-10-CM

## 2019-04-12 DIAGNOSIS — J00 Acute nasopharyngitis [common cold]: Secondary | ICD-10-CM | POA: Diagnosis not present

## 2019-04-12 MED ORDER — IPRATROPIUM BROMIDE 0.03 % NA SOLN: 2.0000 | Freq: Three times a day (TID) | NASAL | 0 refills | Status: DC | PRN

## 2019-04-12 NOTE — Progress Notes (Signed)
Done

## 2019-04-12 NOTE — Patient Instructions (Addendum)
Stop taking Robitussin DM.  Instead, take Muciinex DM 12 hour, twice daily. Along with this, you should take loratidine 10mg  once daily.  Do NOT take either of the Coricidin HBP products that you have taken in the past, as they will overlap with the ingredients in the other recommended medications.  If needed, for runny nose not controlled with the loratidine, you can use the ipratropium nasal spray, 2 sprays into each nostril up toe three times daily, if needed.  When we spoke, I didn't realize you have had this before (the prescription hadn't been filled since 2018, so either you didn't have any left, or it was old).  I sent a new prescription to the [facility]  near [facility].  Please contact us if you develop fever, chills, discolored mucus or phlegm, shortness of breath, change with breathing, or other concerns.  We did discuss the possibility that this could potentially be the coronavirus (as opposed to a different viral illness, or allergies, all of which could be causing your symptoms).  Consider going for COVID testing if your symptoms persist/worsen.

## 2019-04-12 NOTE — Progress Notes (Signed)
Start time: 10:44 End time: 11:04   Virtual Visit via Telephone Note  I connected with [name] on 04/12/19 at 10:45 AM EST by telephone and verified that I am speaking with the correct person using two identifiers.  Location: Patient: home, son also providing history Provider: office   I discussed the limitations, risks, security and privacy concerns of performing an evaluation and management service by telephone and the availability of in person appointments. I also discussed with the patient that there may be a patient responsible charge related to this service. The patient expressed understanding and agreed to proceed.   History of Present Illness: Chief Complaint  Patient presents with  . chest cold    chest cold- since last tuesday. cough, no fever. did virtua with Dr. Redmond School last week and has been told to take robtussion and it has helped some but has now gone into chest    She had phone encounter with Dr. Redmond School on 11/10 for 1 day history of slight cough, raspy voice, runny nose and sinus congestion.  At that time she had no fever, chills, ear pain, shortness of breath, and had been using Coricidin D.  She denied any COVID exposures.  She reports that symptoms started after being outside at a funeral.  The faspy voice resolved, had been getting better. Since that visit she has used Robitussin DM, about every 5 hours.  Last dose was last night, hadn't had any today.  Felt like it was working well until today.  She had stopped taking it when she thought she was "over it".  Today she feels it in her chest.  The "head cold" is gone per son, but she reports having a lot of runny nose, clear nasal drainage.  Phlegm is "pinkish" when she coughs it up (son states that he medicine she takes is pink/red).  Denies any shortness of breath.  Yesterday she tried to walk some, it was "too much". No chest pain or dyspnea, just tired.  meds they also have at home--mucinex DM 12 hour,  Coridicin HBP, 2 kinds-- chest congestion and cough (dextromethorphan, hydrobromide, guafenisin), and cold and flu (acetaminophen, chlorpheniramine)--this is what she had been taking prior to last visit. She actually finished it and has none left at home.  PMH, PSH, SH reviewed  Outpatient Encounter Medications as of 04/12/2019  Medication Sig  . acetaminophen (TYLENOL) 650 MG CR tablet Take 650 mg by mouth 3 (three) times daily. Morning, supper and bedtime  . atorvastatin (LIPITOR) 40 MG tablet TAKE 1 TABLET DAILY AT 6 P.M.  . augmented betamethasone dipropionate (DIPROLENE-AF) 0.05 % cream   . Cholecalciferol (VITAMIN D) 50 MCG (2000 UT) tablet Take 2,000 Units by mouth daily.  . diphenhydramine-acetaminophen (ACETAMINOPHEN PM) 25-500 MG TABS tablet Take 1 tablet by mouth at bedtime as needed.  . ezetimibe (ZETIA) 10 MG tablet Take 1 tablet (10 mg total) by mouth daily.  . folic acid (FOLVITE) 1 MG tablet TAKE 2 TABLETS DAILY (Patient taking differently: Take 2 mg by mouth daily. )  . levothyroxine (SYNTHROID) 88 MCG tablet TAKE 1 TABLET DAILY BEFORE BREAKFAST  . loratadine (CLARITIN) 10 MG tablet Take 10 mg by mouth daily.  . magnesium hydroxide (MILK OF MAGNESIA) 400 MG/5ML suspension Take 15 mLs by mouth daily as needed for mild constipation.  . metoprolol tartrate (LOPRESSOR) 25 MG tablet TAKE 1 TABLET TWICE A DAY (Patient taking differently: Take 25 mg by mouth 2 (two) times daily. )  . multivitamin-iron-minerals-folic  acid (CENTRUM) chewable tablet Chew 1 tablet by mouth daily.  Marland Kitchen OVER THE COUNTER MEDICATION Take 1 tablet by mouth See admin instructions. Stool softener over the counter by mouth daily  Fiber  . OVER THE COUNTER MEDICATION Take 1 tablet by mouth See admin instructions. Over the counter pill for osteoarthritis by mouth three time daily  . spironolactone (ALDACTONE) 25 MG tablet Take 1 tablet (25 mg total) by mouth daily.  . vitamin C (ASCORBIC ACID) 500 MG tablet Take 500  mg by mouth daily.  Marland Kitchen warfarin (COUMADIN) 2.5 MG tablet TAKE 1 TABLET DAILY OR AS DIRECTED BY COUMADIN CLINIC  . ipratropium (ATROVENT) 0.03 % nasal spray Place 2 sprays into both nostrils 2 (two) times daily. Use as directed (Patient not taking: Reported on 04/12/2019)   No facility-administered encounter medications on file as of 04/12/2019.     Allergies  Allergen Reactions  . [name] [Ethacrynic Acid] Nausea And Vomiting  . Furosemide Rash  . Sulfa Antibiotics Itching and Rash  . Tape Rash and Other (See Comments)    Tears skin off.  Please use "paper" tape only.   . [name] Rash    Per Dr Sallyanne Kuster, had a skin reaction with thiazides prior to use of Lasix  . Torsemide Rash   ROS:  No fever or chills. No headaches, dizziness. No nausea, vomiting or diarrhea.  No rashes. No chest pain. No shortness of breath. No loss of taste or smell.  See HPI    Observations/Objective:  BP 126/67   Pulse 85   Temp 97.7 F (36.5 C)   Wt 122 lb (55.3 kg)   BMI 21.61 kg/m   Alert, oriented, speaking comfortably with occasional cough. Exam is limited due to telephone nature of the visit.  Assessment and Plan:  Viral URI with cough - and congestoin.  viral URI vs allergies vs COVID all discussed.  Supportive measures reviewed, and s/sx bacterial infection and concerning sx  Acute rhinitis - Plan: ipratropium (ATROVENT) 0.03 % nasal spray   Switch from Robitussin DM to Mucinex DM 12 hour. Could use either coricidin cold and flu or loratidine 10mg --recommended loratidine since it is 24 hours and they already have this at home. Not to use any of the coricidin products, just the loratidine and Mucinex DM. Add atrovent nasal spray (previously used, last rx from 2018) to help with rhinorrhea. S/sx of bacterial infections and red flags of concern reviewed with patient and son.  All questions anwered.    Follow Up Instructions:    I discussed the assessment and treatment  plan with the patient. The patient was provided an opportunity to ask questions and all were answered. The patient agreed with the plan and demonstrated an understanding of the instructions.   The patient was advised to call back or seek an in-person evaluation if the symptoms worsen or if the condition fails to improve as anticipated.  I provided 20 minutes of non-face-to-face time during this encounter.   Vikki Ports, MD

## 2019-04-19 ENCOUNTER — Telehealth: Payer: Self-pay

## 2019-04-19 NOTE — Telephone Encounter (Signed)
Called the pt and instructed them to have a covid test done so that we can clear her to schedule her next coumadin clinic

## 2019-04-19 NOTE — Telephone Encounter (Signed)
lmom to call us back to r/s appt for coumadin.

## 2019-04-20 ENCOUNTER — Telehealth: Payer: Self-pay | Admitting: Family Medicine

## 2019-04-20 NOTE — Telephone Encounter (Signed)
Son called & states cancer center recommended pt go for COVID testing and she still has cough and cold.  Wanted information for testing and recommendations.  I advised [facility], he states they are in [place].  I gave him information for [facility] on [facility] where she can be tested and also seen for her continued cough.  He will take her there.

## 2019-04-21 ENCOUNTER — Telehealth: Payer: Self-pay | Admitting: Family Medicine

## 2019-04-21 MED ORDER — BENZONATATE 100 MG PO CAPS: 100.0000 mg | ORAL_CAPSULE | Freq: Two times a day (BID) | ORAL | 0 refills | Status: DC | PRN

## 2019-04-21 NOTE — Telephone Encounter (Signed)
Pt son called and states that pt is still coughing, and states that it is like a bad dry cough, states noting Is coming up states she is taking the mucin ex, but it is not helping, he is going to take her to the urgent care at 1.30 to get tested for COVID. But he wants to know if you can send her something in to help with the cough, states he is just getting her tested they are not seeing her pt son can be reached at [phone]

## 2019-04-21 NOTE — Telephone Encounter (Signed)
Called and left message with pts son

## 2019-04-21 NOTE — Telephone Encounter (Signed)
I sent in tessalon prescription, to take twice daily if needed for cough. She can use this in addition to the Mucinex we previously discussed.  It would make more sense to have someone actually listen to her lungs and assess her, rather than just doing a COVID test.  If she isn't getting better, she will need to go to an urgent care for evaluation

## 2019-04-29 ENCOUNTER — Other Ambulatory Visit: Payer: Self-pay | Admitting: Cardiovascular Disease

## 2019-04-30 ENCOUNTER — Other Ambulatory Visit: Payer: Self-pay | Admitting: Cardiovascular Disease

## 2019-05-04 ENCOUNTER — Ambulatory Visit (INDEPENDENT_AMBULATORY_CARE_PROVIDER_SITE_OTHER): Payer: Medicare Other | Admitting: Pharmacist

## 2019-05-04 ENCOUNTER — Other Ambulatory Visit: Payer: Self-pay

## 2019-05-04 DIAGNOSIS — Z7901 Long term (current) use of anticoagulants: Secondary | ICD-10-CM

## 2019-05-04 DIAGNOSIS — I480 Paroxysmal atrial fibrillation: Secondary | ICD-10-CM

## 2019-05-04 LAB — POCT INR: INR: 1.6 — AB (ref 2.0–3.0)

## 2019-05-13 ENCOUNTER — Other Ambulatory Visit: Payer: Self-pay | Admitting: Cardiovascular Disease

## 2019-05-17 ENCOUNTER — Other Ambulatory Visit: Payer: Self-pay | Admitting: Cardiovascular Disease

## 2019-05-17 DIAGNOSIS — E875 Hyperkalemia: Secondary | ICD-10-CM

## 2019-05-26 ENCOUNTER — Ambulatory Visit (INDEPENDENT_AMBULATORY_CARE_PROVIDER_SITE_OTHER): Payer: Medicare Other | Admitting: Pharmacist

## 2019-05-26 ENCOUNTER — Other Ambulatory Visit: Payer: Self-pay

## 2019-05-26 DIAGNOSIS — Z7901 Long term (current) use of anticoagulants: Secondary | ICD-10-CM

## 2019-05-26 DIAGNOSIS — I480 Paroxysmal atrial fibrillation: Secondary | ICD-10-CM | POA: Diagnosis not present

## 2019-05-26 LAB — POCT INR: INR: 1.7 — AB (ref 2.0–3.0)

## 2019-05-26 NOTE — Patient Instructions (Addendum)
Increase dose to 1 tablet daily except 1/2 tablet on Mondays, Wednesdays, and Fridays. Repeat INR in 3 weeks. *Call insurance to check if able to afford Eliquis*

## 2019-05-27 ENCOUNTER — Telehealth: Payer: Self-pay | Admitting: Cardiovascular Disease

## 2019-05-27 ENCOUNTER — Other Ambulatory Visit: Payer: Self-pay

## 2019-05-27 DIAGNOSIS — I5032 Chronic diastolic (congestive) heart failure: Secondary | ICD-10-CM

## 2019-05-27 NOTE — Telephone Encounter (Signed)
Called pt son back - stated pt has gained 2 lbs in 2 days. Stated that her legs and ankles are swollen and painful. Asked if pt was taking prescribed medications, agreed. Asked if pt was consuming a lot of salty foods, denied. Asked if pt was feeling SOB. Denied. Called pt to speak with her. Stated her legs were swollen and hurting. Asked about SOB, stated that at night when she lies flat that she gets SOB and has to get up for a little bit. Advised pt and pt son to continue to weigh herself daily and if she has weight gain of 3 or more pounds to take an extra dose of her fluid pill and to call the office. Advised pt that if she becomes SOB and it doesn't subside or has chest pain to call 911. Made appt with Almyra Deforest on 06/03/19. Verbalized understanding.

## 2019-05-27 NOTE — Telephone Encounter (Signed)
Agree. Only diuretic she can take is spironolactone. Allergic to thiazides and furosemide (all sulfa moiety drugs) and nausea and vomiting on ethacrynic acid. Will need a BMET when she comes in 01/07

## 2019-05-27 NOTE — Telephone Encounter (Signed)
Pt c/o swelling: STAT is pt has developed SOB within 24 hours  1) How much weight have you gained and in what time span? 2lbs in 2 days  2) If swelling, where is the swelling located? Ankles and feet  3) Are you currently taking a fluid pill?  yes  4) Are you currently SOB?  no  5) Do you have a log of your daily weights (if so, list)? yes  6) Have you gained 3 pounds in a day or 5 pounds in a week?  2lbs in 2 days  7) Have you traveled recently? no

## 2019-05-28 ENCOUNTER — Telehealth: Payer: Self-pay | Admitting: Physician Assistant

## 2019-05-28 ENCOUNTER — Emergency Department (HOSPITAL_BASED_OUTPATIENT_CLINIC_OR_DEPARTMENT_OTHER)
Admission: EM | Admit: 2019-05-28 | Discharge: 2019-05-28 | Disposition: A | Discharge status: Home / Self Care | Payer: Medicare Other | Source: Home / Self Care | Attending: Emergency Medicine | Admitting: Emergency Medicine

## 2019-05-28 ENCOUNTER — Emergency Department (HOSPITAL_BASED_OUTPATIENT_CLINIC_OR_DEPARTMENT_OTHER): Payer: Medicare Other

## 2019-05-28 ENCOUNTER — Encounter (HOSPITAL_BASED_OUTPATIENT_CLINIC_OR_DEPARTMENT_OTHER): Payer: Self-pay | Admitting: *Deleted

## 2019-05-28 ENCOUNTER — Other Ambulatory Visit: Payer: Self-pay

## 2019-05-28 DIAGNOSIS — R0600 Dyspnea, unspecified: Secondary | ICD-10-CM | POA: Diagnosis not present

## 2019-05-28 DIAGNOSIS — Z882 Allergy status to sulfonamides status: Secondary | ICD-10-CM | POA: Diagnosis not present

## 2019-05-28 DIAGNOSIS — J90 Pleural effusion, not elsewhere classified: Secondary | ICD-10-CM

## 2019-05-28 DIAGNOSIS — Z8701 Personal history of pneumonia (recurrent): Secondary | ICD-10-CM | POA: Diagnosis not present

## 2019-05-28 DIAGNOSIS — I4892 Unspecified atrial flutter: Secondary | ICD-10-CM | POA: Diagnosis not present

## 2019-05-28 DIAGNOSIS — I480 Paroxysmal atrial fibrillation: Secondary | ICD-10-CM | POA: Diagnosis not present

## 2019-05-28 DIAGNOSIS — I509 Heart failure, unspecified: Secondary | ICD-10-CM | POA: Diagnosis not present

## 2019-05-28 DIAGNOSIS — R2243 Localized swelling, mass and lump, lower limb, bilateral: Secondary | ICD-10-CM | POA: Insufficient documentation

## 2019-05-28 DIAGNOSIS — I130 Hypertensive heart and chronic kidney disease with heart failure and stage 1 through stage 4 chronic kidney disease, or unspecified chronic kidney disease: Secondary | ICD-10-CM | POA: Insufficient documentation

## 2019-05-28 DIAGNOSIS — I2510 Atherosclerotic heart disease of native coronary artery without angina pectoris: Secondary | ICD-10-CM | POA: Insufficient documentation

## 2019-05-28 DIAGNOSIS — Z79890 Hormone replacement therapy (postmenopausal): Secondary | ICD-10-CM | POA: Diagnosis not present

## 2019-05-28 DIAGNOSIS — E039 Hypothyroidism, unspecified: Secondary | ICD-10-CM | POA: Insufficient documentation

## 2019-05-28 DIAGNOSIS — R0602 Shortness of breath: Secondary | ICD-10-CM | POA: Insufficient documentation

## 2019-05-28 DIAGNOSIS — I483 Typical atrial flutter: Secondary | ICD-10-CM | POA: Diagnosis not present

## 2019-05-28 DIAGNOSIS — I5033 Acute on chronic diastolic (congestive) heart failure: Secondary | ICD-10-CM

## 2019-05-28 DIAGNOSIS — I272 Pulmonary hypertension, unspecified: Secondary | ICD-10-CM | POA: Diagnosis not present

## 2019-05-28 DIAGNOSIS — Z7901 Long term (current) use of anticoagulants: Secondary | ICD-10-CM | POA: Insufficient documentation

## 2019-05-28 DIAGNOSIS — Z79899 Other long term (current) drug therapy: Secondary | ICD-10-CM | POA: Insufficient documentation

## 2019-05-28 DIAGNOSIS — I959 Hypotension, unspecified: Secondary | ICD-10-CM | POA: Diagnosis not present

## 2019-05-28 DIAGNOSIS — M7989 Other specified soft tissue disorders: Secondary | ICD-10-CM

## 2019-05-28 DIAGNOSIS — I110 Hypertensive heart disease with heart failure: Secondary | ICD-10-CM | POA: Diagnosis not present

## 2019-05-28 DIAGNOSIS — Z91048 Other nonmedicinal substance allergy status: Secondary | ICD-10-CM | POA: Diagnosis not present

## 2019-05-28 DIAGNOSIS — R0902 Hypoxemia: Secondary | ICD-10-CM | POA: Diagnosis not present

## 2019-05-28 DIAGNOSIS — E785 Hyperlipidemia, unspecified: Secondary | ICD-10-CM | POA: Diagnosis not present

## 2019-05-28 DIAGNOSIS — Z889 Allergy status to unspecified drugs, medicaments and biological substances status: Secondary | ICD-10-CM | POA: Diagnosis not present

## 2019-05-28 DIAGNOSIS — I447 Left bundle-branch block, unspecified: Secondary | ICD-10-CM | POA: Diagnosis not present

## 2019-05-28 DIAGNOSIS — Z888 Allergy status to other drugs, medicaments and biological substances status: Secondary | ICD-10-CM | POA: Diagnosis not present

## 2019-05-28 DIAGNOSIS — I4891 Unspecified atrial fibrillation: Secondary | ICD-10-CM | POA: Diagnosis not present

## 2019-05-28 DIAGNOSIS — N183 Chronic kidney disease, stage 3 unspecified: Secondary | ICD-10-CM | POA: Insufficient documentation

## 2019-05-28 DIAGNOSIS — Z743 Need for continuous supervision: Secondary | ICD-10-CM | POA: Diagnosis not present

## 2019-05-28 DIAGNOSIS — M1990 Unspecified osteoarthritis, unspecified site: Secondary | ICD-10-CM | POA: Diagnosis not present

## 2019-05-28 DIAGNOSIS — C8590 Non-Hodgkin lymphoma, unspecified, unspecified site: Secondary | ICD-10-CM | POA: Diagnosis not present

## 2019-05-28 DIAGNOSIS — L309 Dermatitis, unspecified: Secondary | ICD-10-CM | POA: Diagnosis not present

## 2019-05-28 DIAGNOSIS — I4821 Permanent atrial fibrillation: Secondary | ICD-10-CM | POA: Diagnosis not present

## 2019-05-28 DIAGNOSIS — Z20822 Contact with and (suspected) exposure to covid-19: Secondary | ICD-10-CM | POA: Diagnosis not present

## 2019-05-28 DIAGNOSIS — Z955 Presence of coronary angioplasty implant and graft: Secondary | ICD-10-CM | POA: Diagnosis not present

## 2019-05-28 DIAGNOSIS — I350 Nonrheumatic aortic (valve) stenosis: Secondary | ICD-10-CM | POA: Diagnosis not present

## 2019-05-28 DIAGNOSIS — I10 Essential (primary) hypertension: Secondary | ICD-10-CM | POA: Diagnosis not present

## 2019-05-28 LAB — CBC WITH DIFFERENTIAL/PLATELET
Abs Immature Granulocytes: 0.02 10*3/uL (ref 0.00–0.07)
Basophils Absolute: 0.1 10*3/uL (ref 0.0–0.1)
Basophils Relative: 1 %
Eosinophils Absolute: 0.1 10*3/uL (ref 0.0–0.5)
Eosinophils Relative: 1 %
HCT: 36.5 % (ref 36.0–46.0)
Hemoglobin: 11.3 g/dL — ABNORMAL LOW (ref 12.0–15.0)
Immature Granulocytes: 0 %
Lymphocytes Relative: 12 %
Lymphs Abs: 1.1 10*3/uL (ref 0.7–4.0)
MCH: 29.6 pg (ref 26.0–34.0)
MCHC: 31 g/dL (ref 30.0–36.0)
MCV: 95.5 fL (ref 80.0–100.0)
Monocytes Absolute: 0.9 10*3/uL (ref 0.1–1.0)
Monocytes Relative: 10 %
Neutro Abs: 6.8 10*3/uL (ref 1.7–7.7)
Neutrophils Relative %: 76 %
Platelets: 241 10*3/uL (ref 150–400)
RBC: 3.82 MIL/uL — ABNORMAL LOW (ref 3.87–5.11)
RDW: 13.7 % (ref 11.5–15.5)
WBC: 8.9 10*3/uL (ref 4.0–10.5)
nRBC: 0 % (ref 0.0–0.2)

## 2019-05-28 LAB — COMPREHENSIVE METABOLIC PANEL
ALT: 17 U/L (ref 0–44)
AST: 21 U/L (ref 15–41)
Albumin: 3.8 g/dL (ref 3.5–5.0)
Alkaline Phosphatase: 77 U/L (ref 38–126)
Anion gap: 9 (ref 5–15)
BUN: 18 mg/dL (ref 8–23)
CO2: 25 mmol/L (ref 22–32)
Calcium: 9.2 mg/dL (ref 8.9–10.3)
Chloride: 108 mmol/L (ref 98–111)
Creatinine, Ser: 0.99 mg/dL (ref 0.44–1.00)
GFR calc Af Amer: 57 mL/min — ABNORMAL LOW (ref >60)
GFR calc non Af Amer: 49 mL/min — ABNORMAL LOW (ref >60)
Glucose, Bld: 100 mg/dL — ABNORMAL HIGH (ref 70–99)
Potassium: 4.4 mmol/L (ref 3.5–5.1)
Sodium: 142 mmol/L (ref 135–145)
Total Bilirubin: 1.7 mg/dL — ABNORMAL HIGH (ref 0.3–1.2)
Total Protein: 6.8 g/dL (ref 6.5–8.1)

## 2019-05-28 LAB — PROTIME-INR
INR: 2 — ABNORMAL HIGH (ref 0.8–1.2)
Prothrombin Time: 22.8 seconds — ABNORMAL HIGH (ref 11.4–15.2)

## 2019-05-28 LAB — BRAIN NATRIURETIC PEPTIDE: B Natriuretic Peptide: 387.5 pg/mL — ABNORMAL HIGH (ref 0.0–100.0)

## 2019-05-28 NOTE — ED Triage Notes (Signed)
pt c/o bil leg swelling  And increased weight gain  X 4 days

## 2019-05-28 NOTE — Discharge Instructions (Addendum)
Keep your appointment with cardiology.  Spoke with on-call cardiologist today.  Were going to increase your spironolactone  by half of tablet.  So you will take 25 mg once during the day and then take a half a tablet 12.5 mg at another time during the day.  Make sense to break this up from morning to afternoon.  Today's work-up did show a little bit of fluid overload.  So it is important to avoid salty foods and to make sure you are continue to restrict your fluid intake.  Return for any new or worse symptoms.

## 2019-05-28 NOTE — ED Notes (Signed)
Portable x-ray done

## 2019-05-28 NOTE — ED Provider Notes (Signed)
[facility]PART[facility] Provider Note   CSN: [id number] Arrival date & time: 05/28/19  1218     History Chief Complaint  Patient presents with  . Leg Swelling    [name] is a 86 y.o. female.  Patient followed by cardiology.  Patient with an allergy to diuretics including Lasix so her fluid is managed by taking spironolactone.  Patient currently taking 1 tablet 25 mg daily.  Patient also has a history of atrial fibrillation and is on Coumadin.  Known history of chronic diastolic congestive heart failure.  And hypertension also aortic valvular stenosis moderate to severe.  History of coronary artery disease.  PCI bright coronary artery in November 2010.  Patient does not have a pacemaker.  Brought in for increased leg swelling for the past 4 days and an increase in weight to maybe about 20 pounds according to her daughter.  Who is her caretaker.  Patient is more comfortable sleeping sitting up.  Patient has follow-up with cardiology later this week.  Patient's oxygen saturation here is 96% on room air.  Heart rate 84 blood pressure 130/49 afebrile.        Past Medical History:  Diagnosis Date  .  Acute respiratiory failure requiring BiPap 06/24/2011  . Allergy   . Aortic valvular stenosis, moderate to severe 06/24/2011   a. 10/2012 Echo: mod AS, Valve 1.25 cm^2 (VTI), 1.19cm^2 (Vmax).  . Arthritis    "back" (10/30/2012)  . Chronic diastolic CHF (congestive heart failure) ([place])    a. 10/2012 Echo: EF 55-60%, no rwma, Gr 2 DD, moderate AS, mod dil LA, mildly to mod dil RA.  Marland Kitchen Coronary artery disease    a. 03/2009 PCI RCA (3.0x18 BMS).  . Dyslipidemia   . Exertional shortness of breath   . Hypertension   . Hypothyroidism   . PAF (paroxysmal atrial fibrillation) ([place])    a. CHA2DS2VASc = 7-->chronic coumadin;  b. s/p DCCV 05/2011, 05/2012, 07/2014;  c. On amio.  . Rash    a. felt to be 2/2 lasix/torsemide in setting of sulfa allergy (lasix d/c ~ 06/2014, torsemide  d/c 08/09/2014).    Patient Active Problem List   Diagnosis Date Noted  . Atrial fibrillation with RVR ([place]) 04/25/2018  . CKD (chronic kidney disease) stage 3, GFR 30-59 ml/min 04/25/2018  . Anemia 04/25/2018  . Supratherapeutic INR 04/25/2018  . Acute gastroenteritis 12/25/2017  . Aspiration pneumonia of left lower lobe due to vomit ([place]) 12/25/2017  . Allergy to sulfa drugs 10/02/2016  . Long term current use of anticoagulant therapy 05/12/2015  . History of fracture of right hip 04/15/2015  . Senile purpura ([place]) 04/13/2015  . Angioimmunoblastic lymphoma ([place]) 04/05/2015  . PAF (paroxysmal atrial fibrillation) ([place])   . Arthritis 09/13/2014  . Intermittent LBBB 08/16/2014  . Drug-induced bradycardia 02/20/2014  . Prolonged Q-T interval on ECG 11/02/2012  . Hypotension 11/01/2012  . Leukocytosis 11/01/2012  . Pleural effusion 10/11/2011  . Aortic valvular stenosis, moderate to severe 06/24/2011  . Hyperlipidemia 06/21/2011  . Hypertensive heart disease with CHF (congestive heart failure) ([place]) 06/21/2011  . Hypothyroidism, goitrous 06/21/2011  . Coronary artery disease 06/21/2011    Past Surgical History:  Procedure Laterality Date  . ANGIOPLASTY    . APPENDECTOMY    . BACK SURGERY    . CARDIAC CATHETERIZATION  10/06/2009   Continue medical therapy  . CARDIAC CATHETERIZATION  04/07/2009   RCA stented with a 3x58mm stent resulting in a reduction of 90% narrowing  to normal  . CARDIOVERSION  06/19/2011   Procedure: CARDIOVERSION;  Surgeon: Sanda Klein, MD;  Location: [facility] OR;  Service: Cardiovascular;  Laterality: N/A;  . CARDIOVERSION  07/11/2010   Successful coversion to sinus rhythm  . CARDIOVERSION N/A 08/10/2014   Procedure: CARDIOVERSION;  Surgeon: Sanda Klein, MD;  Location: [facility] ENDOSCOPY;  Service: Cardiovascular;  Laterality: N/A;  . CATARACT EXTRACTION W/ INTRAOCULAR LENS  IMPLANT, BILATERAL    . CORONARY ANGIOPLASTY WITH STENT PLACEMENT     "1" (10/30/2012)  .  DILATION AND CURETTAGE OF UTERUS    . EXCISIONAL HEMORRHOIDECTOMY    . FRACTURE SURGERY    . HIP ARTHROPLASTY Right 04/17/2015   Procedure: HIP MONOPOLAR HIP HEMI ARTHROPLASTY;  Surgeon: Marybelle Killings, MD;  Location: [facility];  Service: Orthopedics;  Laterality: Right;  . IR THORACENTESIS ASP PLEURAL SPACE W/IMG GUIDE  04/28/2018  . IR THORACENTESIS ASP PLEURAL SPACE W/IMG GUIDE  05/26/2018  . LUMBAR DISC SURGERY  X 2   "cleaned out scar tissue"   . SHOULDER ACROMIOPLASTY Left    "fell; put a new ball in" (10/30/2012)  . WRIST FRACTURE SURGERY Left      OB History   No obstetric history on file.     Family History  Problem Relation Age of Onset  . Heart disease Brother   . Heart disease Brother   . Lung disease Brother   . Heart disease Mother 40  . Cancer Mother 47       Uterine  . Stroke Mother 69  . Heart failure Mother   . Hypertension Mother   . Heart disease Father   . Heart attack Father   . Hypertension Father   . Hypertension Child 34  . Alcohol abuse Child   . Cancer Son   . Hypertension Sister   . Hypertension Brother     Social History   Tobacco Use  . Smoking status: Never Smoker  . Smokeless tobacco: Never Used  Substance Use Topics  . Alcohol use: No  . Drug use: No    Home Medications Prior to Admission medications   Medication Sig Start Date End Date Taking? Authorizing Provider  acetaminophen (TYLENOL) 650 MG CR tablet Take 650 mg by mouth 3 (three) times daily. Morning, supper and bedtime    [provider]  atorvastatin (LIPITOR) 40 MG tablet TAKE 1 TABLET DAILY AT 6 P.M. 02/19/19   Croitoru, Mihai, MD  augmented betamethasone dipropionate (DIPROLENE-AF) 0.05 % cream  01/05/19   [provider]  benzonatate (TESSALON) 100 MG capsule Take 1 capsule (100 mg total) by mouth 2 (two) times daily as needed for cough. 04/21/19   Rita Ohara, MD  Cholecalciferol (VITAMIN D) 50 MCG (2000 UT) tablet Take 2,000 Units by mouth daily.     [provider]  diphenhydramine-acetaminophen (ACETAMINOPHEN PM) 25-500 MG TABS tablet Take 1 tablet by mouth at bedtime as needed.    [provider]  ezetimibe (ZETIA) 10 MG tablet Take 1 tablet (10 mg total) by mouth daily. 03/15/19   Croitoru, Mihai, MD  folic acid (FOLVITE) 1 MG tablet TAKE 2 TABLETS DAILY 04/30/19   Croitoru, Mihai, MD  ipratropium (ATROVENT) 0.03 % nasal spray Place 2 sprays into both nostrils 3 (three) times daily as needed for rhinitis. 04/12/19   Rita Ohara, MD  levothyroxine (SYNTHROID) 88 MCG tablet TAKE 1 TABLET DAILY BEFORE BREAKFAST 12/22/18   [name], MD  loratadine (CLARITIN) 10 MG tablet Take 10 mg by  mouth daily.    [provider]  magnesium hydroxide (MILK OF MAGNESIA) 400 MG/5ML suspension Take 15 mLs by mouth daily as needed for mild constipation. 10/08/16   Lavina Hamman, MD  metoprolol tartrate (LOPRESSOR) 25 MG tablet TAKE 1 TABLET TWICE A DAY 05/13/19   Croitoru, Mihai, MD  multivitamin-iron-minerals-folic acid (CENTRUM) chewable tablet Chew 1 tablet by mouth daily.    [provider]  OVER THE COUNTER MEDICATION Take 1 tablet by mouth See admin instructions. Stool softener over the counter by mouth daily  Fiber    [provider]  OVER THE COUNTER MEDICATION Take 1 tablet by mouth See admin instructions. Over the counter pill for osteoarthritis by mouth three time daily    [provider]  spironolactone (ALDACTONE) 25 MG tablet Take 1 tablet (25 mg total) by mouth daily. 05/18/19   Croitoru, Mihai, MD  vitamin C (ASCORBIC ACID) 500 MG tablet Take 500 mg by mouth daily.    [provider]  warfarin (COUMADIN) 2.5 MG tablet TAKE 1/2 to 1 TABLET DAILY  AS DIRECTED BY COUMADIN CLINIC 04/30/19   Croitoru, Mihai, MD    Allergies    [name] [ethacrynic acid], Furosemide, Sulfa antibiotics, Tape, [name], and Torsemide  Review of Systems   Review of Systems    Constitutional: Positive for unexpected weight change. Negative for chills and fever.  HENT: Negative for congestion, rhinorrhea and sore throat.   Eyes: Negative for visual disturbance.  Respiratory: Positive for shortness of breath. Negative for cough.   Cardiovascular: Positive for leg swelling. Negative for chest pain.  Gastrointestinal: Negative for abdominal pain, diarrhea, nausea and vomiting.  Genitourinary: Negative for dysuria.  Musculoskeletal: Negative for back pain and neck pain.  Skin: Negative for rash.  Neurological: Negative for dizziness, light-headedness and headaches.  Hematological: Does not bruise/bleed easily.  Psychiatric/Behavioral: Negative for confusion.    Physical Exam Updated Vital Signs BP 133/62   Pulse 66   Temp 98 F (36.7 C) (Oral)   Resp (!) 23   Ht 1.6 m (5\' 3" )   Wt 57.2 kg   SpO2 97%   BMI 22.32 kg/m   Physical Exam Vitals and nursing note reviewed.  Constitutional:      General: She is not in acute distress.    Appearance: Normal appearance. She is well-developed.  HENT:     Head: Normocephalic and atraumatic.  Eyes:     Extraocular Movements: Extraocular movements intact.     Conjunctiva/sclera: Conjunctivae normal.     Pupils: Pupils are equal, round, and reactive to light.  Cardiovascular:     Rate and Rhythm: Normal rate and regular rhythm.     Heart sounds: No murmur.  Pulmonary:     Effort: Pulmonary effort is normal. No respiratory distress.     Breath sounds: Normal breath sounds.  Abdominal:     Palpations: Abdomen is soft.     Tenderness: There is no abdominal tenderness.  Musculoskeletal:        General: Normal range of motion.     Cervical back: Normal range of motion and neck supple.     Right lower leg: Edema present.     Left lower leg: Edema present.     Comments: Bilateral leg swelling with some slight pitting edema.  No wounds.  Starting to show some chronic skin changes secondary to the edema.   Skin:     General: Skin is warm and dry.  Neurological:  General: No focal deficit present.     Mental Status: She is alert and oriented to person, place, and time.     ED Results / Procedures / Treatments   Labs (all labs ordered are listed, but only abnormal results are displayed) Labs Reviewed  CBC WITH DIFFERENTIAL/PLATELET - Abnormal; Notable for the following components:      Result Value   RBC 3.82 (*)    Hemoglobin 11.3 (*)    All other components within normal limits  COMPREHENSIVE METABOLIC PANEL - Abnormal; Notable for the following components:   Glucose, Bld 100 (*)    Total Bilirubin 1.7 (*)    GFR calc non Af Amer 49 (*)    GFR calc Af Amer 57 (*)    All other components within normal limits  BRAIN NATRIURETIC PEPTIDE - Abnormal; Notable for the following components:   B Natriuretic Peptide 387.5 (*)    All other components within normal limits  PROTIME-INR - Abnormal; Notable for the following components:   Prothrombin Time 22.8 (*)    INR 2.0 (*)    All other components within normal limits    EKG EKG Interpretation  Date/Time:  Friday May 28 2019 12:31:32 EST Ventricular Rate:  85 PR Interval:    QRS Duration: 136 QT Interval:  453 QTC Calculation: 539 R Axis:   -57 Text Interpretation: Atrial flutter Left bundle branch block No significant change since last tracing Confirmed by Fredia Sorrow [phone]) on 05/28/2019 12:59:09 PM   Radiology DG Chest Port 1 View  Result Date: 05/28/2019 CLINICAL DATA:  Bilateral lower extremity swelling. EXAM: PORTABLE CHEST 1 VIEW COMPARISON:  June 26, 2018. FINDINGS: Stable cardiomegaly. No pneumothorax is noted. Right lung is clear. Small left pleural effusion is noted with probable associated atelectasis or scarring. Bony thorax is unremarkable. IMPRESSION: Small left pleural effusion with probable associated atelectasis or scarring. Stable cardiomegaly. No pneumothorax is noted. Electronically Signed   By: Marijo Conception M.D.   On: 05/28/2019 13:52    Procedures Procedures (including critical care time)  Medications Ordered in ED Medications - No data to display  ED Course  I have reviewed the triage vital signs and the nursing notes.  Pertinent labs & imaging results that were available during my care of the patient were reviewed by me and considered in my medical decision making (see chart for details).    MDM Rules/Calculators/A&P                      Patient in no acute respiratory distress.  Does have bilateral leg edema sounds as if there is been weight gain.  Has been eating some salty food over Christmas but has been avoiding ham.  Patient comfortable lying down in the bed.  Although not comfortable sleeping lying down.  Chest x-ray without any significant pulmonary edema.  Does show a little bit of pleural effusion.  But is small.  On the left side.  Patient's cardiac monitoring showed atrial fibrillation but rate controlled.  Patient on Coumadin.  BNP normally around 280 up about 380 so some increase.  No significant renal function changes.  Potassium is normal.  Patient without any chest pain.  Discussed with on call cardiologist.  Recommending increasing her spironolactone by half a tablet.  She will take 25 mg in the morning and 12.5 mg later in the day.  They will follow her up this week.  Patient to watch fluid intake and certainly to  avoid salty foods.  Patient in no respiratory distress at this time.  Patient stable for discharge home close follow-up with cardiology.    Final Clinical Impression(s) / ED Diagnoses Final diagnoses:  Leg swelling  Acute on chronic diastolic congestive heart failure (HCC)  Pleural effusion    Rx / DC Orders ED Discharge Orders    None       Fredia Sorrow, MD 05/28/19 1555

## 2019-05-28 NOTE — Telephone Encounter (Signed)
86 yo female with coronary artery disease, aortic stenosis, diastolic CHF and atrial fibrillation.  She called in recently with weight gain and swelling.  She was asked to take an extra dose of Spironolactone.  She called back today due to further weight gain and swelling.  She gained another 4 lbs overnight and notes shortness of breath with just minimal activity.  She also notes orthopnea.  She has not had fever, chest pain, cough, syncope.  She notes her HRs have been in the low 100s.  PLAN:  Options are quite limited. She is allergic to sulfa and has not been able to take furosemide, torsemide or thiazide diuretics.  She has a hx of nausea and vomiting with ethacrynic acid.  It sounds like she is significantly volume overloaded and now her HR is uncontrolled.  She has mildly elevated creatinine and her K+ has been elevated in the past.  I am hesitant to tell her to continue to escalate her potassium sparing diuretic.   I have recommended she go to the ED today for further evaluation.   Richardson Dopp, PA-C    05/28/2019 11:46 AM

## 2019-05-30 ENCOUNTER — Other Ambulatory Visit: Payer: Self-pay

## 2019-05-30 ENCOUNTER — Encounter (HOSPITAL_COMMUNITY): Payer: Self-pay | Admitting: Emergency Medicine

## 2019-05-30 ENCOUNTER — Inpatient Hospital Stay (HOSPITAL_COMMUNITY)
Admission: EM | Admit: 2019-05-30 | Discharge: 2019-06-01 | DRG: 291 | Disposition: A | Discharge status: Home Health | Payer: Medicare Other | Attending: Cardiovascular Disease | Admitting: Cardiovascular Disease

## 2019-05-30 ENCOUNTER — Emergency Department (HOSPITAL_COMMUNITY): Payer: Medicare Other

## 2019-05-30 DIAGNOSIS — I4891 Unspecified atrial fibrillation: Secondary | ICD-10-CM | POA: Diagnosis not present

## 2019-05-30 DIAGNOSIS — I130 Hypertensive heart and chronic kidney disease with heart failure and stage 1 through stage 4 chronic kidney disease, or unspecified chronic kidney disease: Principal | ICD-10-CM | POA: Diagnosis present

## 2019-05-30 DIAGNOSIS — E875 Hyperkalemia: Secondary | ICD-10-CM

## 2019-05-30 DIAGNOSIS — I5033 Acute on chronic diastolic (congestive) heart failure: Secondary | ICD-10-CM | POA: Diagnosis present

## 2019-05-30 DIAGNOSIS — I2510 Atherosclerotic heart disease of native coronary artery without angina pectoris: Secondary | ICD-10-CM | POA: Diagnosis present

## 2019-05-30 DIAGNOSIS — N183 Chronic kidney disease, stage 3 unspecified: Secondary | ICD-10-CM | POA: Diagnosis present

## 2019-05-30 DIAGNOSIS — Z79890 Hormone replacement therapy (postmenopausal): Secondary | ICD-10-CM

## 2019-05-30 DIAGNOSIS — R0902 Hypoxemia: Secondary | ICD-10-CM | POA: Diagnosis not present

## 2019-05-30 DIAGNOSIS — I10 Essential (primary) hypertension: Secondary | ICD-10-CM

## 2019-05-30 DIAGNOSIS — E039 Hypothyroidism, unspecified: Secondary | ICD-10-CM | POA: Diagnosis present

## 2019-05-30 DIAGNOSIS — I4892 Unspecified atrial flutter: Secondary | ICD-10-CM

## 2019-05-30 DIAGNOSIS — Z889 Allergy status to unspecified drugs, medicaments and biological substances status: Secondary | ICD-10-CM | POA: Diagnosis not present

## 2019-05-30 DIAGNOSIS — Z882 Allergy status to sulfonamides status: Secondary | ICD-10-CM

## 2019-05-30 DIAGNOSIS — I509 Heart failure, unspecified: Secondary | ICD-10-CM

## 2019-05-30 DIAGNOSIS — Z888 Allergy status to other drugs, medicaments and biological substances status: Secondary | ICD-10-CM

## 2019-05-30 DIAGNOSIS — I447 Left bundle-branch block, unspecified: Secondary | ICD-10-CM | POA: Diagnosis not present

## 2019-05-30 DIAGNOSIS — R0600 Dyspnea, unspecified: Secondary | ICD-10-CM | POA: Diagnosis not present

## 2019-05-30 DIAGNOSIS — I4821 Permanent atrial fibrillation: Secondary | ICD-10-CM | POA: Diagnosis present

## 2019-05-30 DIAGNOSIS — C8590 Non-Hodgkin lymphoma, unspecified, unspecified site: Secondary | ICD-10-CM | POA: Diagnosis present

## 2019-05-30 DIAGNOSIS — M1990 Unspecified osteoarthritis, unspecified site: Secondary | ICD-10-CM | POA: Diagnosis present

## 2019-05-30 DIAGNOSIS — I480 Paroxysmal atrial fibrillation: Secondary | ICD-10-CM | POA: Diagnosis present

## 2019-05-30 DIAGNOSIS — Z8249 Family history of ischemic heart disease and other diseases of the circulatory system: Secondary | ICD-10-CM

## 2019-05-30 DIAGNOSIS — E785 Hyperlipidemia, unspecified: Secondary | ICD-10-CM | POA: Diagnosis present

## 2019-05-30 DIAGNOSIS — I110 Hypertensive heart disease with heart failure: Secondary | ICD-10-CM | POA: Diagnosis not present

## 2019-05-30 DIAGNOSIS — Z20822 Contact with and (suspected) exposure to covid-19: Secondary | ICD-10-CM | POA: Diagnosis present

## 2019-05-30 DIAGNOSIS — I272 Pulmonary hypertension, unspecified: Secondary | ICD-10-CM | POA: Diagnosis present

## 2019-05-30 DIAGNOSIS — Z7901 Long term (current) use of anticoagulants: Secondary | ICD-10-CM

## 2019-05-30 DIAGNOSIS — I350 Nonrheumatic aortic (valve) stenosis: Secondary | ICD-10-CM | POA: Diagnosis present

## 2019-05-30 DIAGNOSIS — Z79899 Other long term (current) drug therapy: Secondary | ICD-10-CM

## 2019-05-30 DIAGNOSIS — Z955 Presence of coronary angioplasty implant and graft: Secondary | ICD-10-CM

## 2019-05-30 DIAGNOSIS — Z91048 Other nonmedicinal substance allergy status: Secondary | ICD-10-CM

## 2019-05-30 DIAGNOSIS — R0602 Shortness of breath: Secondary | ICD-10-CM | POA: Diagnosis not present

## 2019-05-30 DIAGNOSIS — Z743 Need for continuous supervision: Secondary | ICD-10-CM | POA: Diagnosis not present

## 2019-05-30 DIAGNOSIS — I959 Hypotension, unspecified: Secondary | ICD-10-CM | POA: Diagnosis not present

## 2019-05-30 DIAGNOSIS — Z8701 Personal history of pneumonia (recurrent): Secondary | ICD-10-CM

## 2019-05-30 DIAGNOSIS — L309 Dermatitis, unspecified: Secondary | ICD-10-CM | POA: Diagnosis present

## 2019-05-30 LAB — BASIC METABOLIC PANEL
Anion gap: 9 (ref 5–15)
BUN: 19 mg/dL (ref 8–23)
CO2: 26 mmol/L (ref 22–32)
Calcium: 9.2 mg/dL (ref 8.9–10.3)
Chloride: 106 mmol/L (ref 98–111)
Creatinine, Ser: 1.09 mg/dL — ABNORMAL HIGH (ref 0.44–1.00)
GFR calc Af Amer: 51 mL/min — ABNORMAL LOW (ref >60)
GFR calc non Af Amer: 44 mL/min — ABNORMAL LOW (ref >60)
Glucose, Bld: 101 mg/dL — ABNORMAL HIGH (ref 70–99)
Potassium: 4.2 mmol/L (ref 3.5–5.1)
Sodium: 141 mmol/L (ref 135–145)

## 2019-05-30 LAB — CBC
HCT: 37.9 % (ref 36.0–46.0)
Hemoglobin: 11.6 g/dL — ABNORMAL LOW (ref 12.0–15.0)
MCH: 29.1 pg (ref 26.0–34.0)
MCHC: 30.6 g/dL (ref 30.0–36.0)
MCV: 95.2 fL (ref 80.0–100.0)
Platelets: 257 10*3/uL (ref 150–400)
RBC: 3.98 MIL/uL (ref 3.87–5.11)
RDW: 13.6 % (ref 11.5–15.5)
WBC: 9 10*3/uL (ref 4.0–10.5)
nRBC: 0 % (ref 0.0–0.2)

## 2019-05-30 LAB — PROTIME-INR
INR: 2.1 — ABNORMAL HIGH (ref 0.8–1.2)
Prothrombin Time: 23.2 seconds — ABNORMAL HIGH (ref 11.4–15.2)

## 2019-05-30 LAB — BRAIN NATRIURETIC PEPTIDE: B Natriuretic Peptide: 345.4 pg/mL — ABNORMAL HIGH (ref 0.0–100.0)

## 2019-05-30 LAB — SARS CORONAVIRUS 2 (TAT 6-24 HRS): SARS Coronavirus 2: NEGATIVE

## 2019-05-30 MED ORDER — SODIUM CHLORIDE 0.9% FLUSH
3.0000 mL | Freq: Two times a day (BID) | INTRAVENOUS | Status: DC
  Administered 2019-05-30 – 2019-06-01 (×2): 3 mL via INTRAVENOUS

## 2019-05-30 MED ORDER — FOLIC ACID 1 MG PO TABS
2.0000 mg | ORAL_TABLET | Freq: Every day | ORAL | Status: DC
  Administered 2019-05-31 – 2019-06-01 (×2): 2 mg via ORAL
  Filled 2019-05-30 (×2): qty 2

## 2019-05-30 MED ORDER — SODIUM CHLORIDE 0.9% FLUSH: 3.0000 mL | Freq: Once | INTRAVENOUS | Status: DC

## 2019-05-30 MED ORDER — DIPHENHYDRAMINE-APAP (SLEEP) 25-500 MG PO TABS: 1.0000 | ORAL_TABLET | Freq: Every evening | ORAL | Status: DC | PRN

## 2019-05-30 MED ORDER — SODIUM CHLORIDE 0.9 % IV SOLN: 250.0000 mL | INTRAVENOUS | Status: DC | PRN

## 2019-05-30 MED ORDER — ONDANSETRON HCL 4 MG/2ML IJ SOLN
4.0000 mg | Freq: Four times a day (QID) | INTRAMUSCULAR | Status: DC | PRN
  Administered 2019-05-30: 4 mg via INTRAVENOUS
  Filled 2019-05-30: qty 2

## 2019-05-30 MED ORDER — SPIRONOLACTONE 25 MG PO TABS
25.0000 mg | ORAL_TABLET | Freq: Every day | ORAL | Status: DC
  Administered 2019-05-31 – 2019-06-01 (×2): 25 mg via ORAL
  Filled 2019-05-30 (×2): qty 1

## 2019-05-30 MED ORDER — WARFARIN SODIUM 2.5 MG PO TABS
2.5000 mg | ORAL_TABLET | Freq: Once | ORAL | Status: AC
  Administered 2019-05-30: 2.5 mg via ORAL
  Filled 2019-05-30: qty 1

## 2019-05-30 MED ORDER — ATORVASTATIN CALCIUM 40 MG PO TABS
40.0000 mg | ORAL_TABLET | Freq: Every day | ORAL | Status: DC
  Administered 2019-05-30 – 2019-05-31 (×2): 40 mg via ORAL
  Filled 2019-05-30 (×2): qty 1

## 2019-05-30 MED ORDER — METOPROLOL TARTRATE 25 MG PO TABS
25.0000 mg | ORAL_TABLET | Freq: Two times a day (BID) | ORAL | Status: DC
  Administered 2019-05-30 – 2019-06-01 (×3): 25 mg via ORAL
  Filled 2019-05-30 (×4): qty 1

## 2019-05-30 MED ORDER — ACETAMINOPHEN 325 MG PO TABS: 650.0000 mg | ORAL_TABLET | ORAL | Status: DC | PRN

## 2019-05-30 MED ORDER — LEVOTHYROXINE SODIUM 88 MCG PO TABS: 88.0000 ug | ORAL_TABLET | Freq: Every day | ORAL | Status: DC

## 2019-05-30 MED ORDER — EZETIMIBE 10 MG PO TABS
10.0000 mg | ORAL_TABLET | Freq: Every day | ORAL | Status: DC
  Administered 2019-05-31 – 2019-06-01 (×2): 10 mg via ORAL
  Filled 2019-05-30 (×2): qty 1

## 2019-05-30 MED ORDER — BUMETANIDE 1 MG PO TABS
1.0000 mg | ORAL_TABLET | Freq: Every day | ORAL | Status: DC
  Administered 2019-05-30 – 2019-06-01 (×3): 1 mg via ORAL
  Filled 2019-05-30 (×3): qty 1

## 2019-05-30 MED ORDER — LORATADINE 10 MG PO TABS
10.0000 mg | ORAL_TABLET | Freq: Every day | ORAL | Status: DC
  Administered 2019-05-31 – 2019-06-01 (×2): 10 mg via ORAL
  Filled 2019-05-30 (×2): qty 1

## 2019-05-30 MED ORDER — TRIAMCINOLONE ACETONIDE 0.5 % EX CREA
TOPICAL_CREAM | Freq: Two times a day (BID) | CUTANEOUS | Status: DC
  Filled 2019-05-30 (×2): qty 15

## 2019-05-30 MED ORDER — WARFARIN - PHARMACIST DOSING INPATIENT: Freq: Every day | Status: DC

## 2019-05-30 MED ORDER — SODIUM CHLORIDE 0.9% FLUSH: 3.0000 mL | INTRAVENOUS | Status: DC | PRN

## 2019-05-30 NOTE — ED Notes (Signed)
Pt put on 4L via [place] - O2 100%

## 2019-05-30 NOTE — ED Notes (Signed)
Zofran given - pt c/o nausea after eating few bites of dinner. Pt drank 100% of milk.

## 2019-05-30 NOTE — ED Provider Notes (Signed)
[facility]ERGENCY DEPARTMENT Provider Note   CSN: [id number] Arrival date & time: 05/30/19  1021     History Chief Complaint  Patient presents with  . Shortness of Breath    [name] is a 86 y.o. female.  HPI    86 year old female with dyspnea.  Worse over the last several days.  Increasing peripheral edema.  Today she was walking to her bathroom when she felt extremely short of breath.  She normally does not get this short of breath with minimal activity as she did today.  She denies any acute pain.  She has been seen recently for the same issues.  She was in the emergency room 2 days ago she had her spironolactone increased.  She feels like her symptoms are worsening despite this.  She is followed by cardiology for chronic diastolic heart failure, coronary artery disease, moderate aortic stenosis, persistent atrial fibrillation. She is on warfarin.   Past Medical History:  Diagnosis Date  .  Acute respiratiory failure requiring BiPap 06/24/2011  . Allergy   . Aortic valvular stenosis, moderate to severe 06/24/2011   a. 10/2012 Echo: mod AS, Valve 1.25 cm^2 (VTI), 1.19cm^2 (Vmax).  . Arthritis    "back" (10/30/2012)  . Chronic diastolic CHF (congestive heart failure) ([place])    a. 10/2012 Echo: EF 55-60%, no rwma, Gr 2 DD, moderate AS, mod dil LA, mildly to mod dil RA.  Marland Kitchen Coronary artery disease    a. 03/2009 PCI RCA (3.0x18 BMS).  . Dyslipidemia   . Exertional shortness of breath   . Hypertension   . Hypothyroidism   . PAF (paroxysmal atrial fibrillation) ([place])    a. CHA2DS2VASc = 7-->chronic coumadin;  b. s/p DCCV 05/2011, 05/2012, 07/2014;  c. On amio.  . Rash    a. felt to be 2/2 lasix/torsemide in setting of sulfa allergy (lasix d/c ~ 06/2014, torsemide d/c 08/09/2014).   Patient Active Problem List   Diagnosis Date Noted  . Atrial fibrillation with RVR ([place]) 04/25/2018  . CKD (chronic kidney disease) stage 3, GFR 30-59 ml/min 04/25/2018  . Anemia  04/25/2018  . Supratherapeutic INR 04/25/2018  . Acute gastroenteritis 12/25/2017  . Aspiration pneumonia of left lower lobe due to vomit ([place]) 12/25/2017  . Allergy to sulfa drugs 10/02/2016  . Long term current use of anticoagulant therapy 05/12/2015  . History of fracture of right hip 04/15/2015  . Senile purpura ([place]) 04/13/2015  . Angioimmunoblastic lymphoma ([place]) 04/05/2015  . PAF (paroxysmal atrial fibrillation) ([place])   . Arthritis 09/13/2014  . Intermittent LBBB 08/16/2014  . Drug-induced bradycardia 02/20/2014  . Prolonged Q-T interval on ECG 11/02/2012  . Hypotension 11/01/2012  . Leukocytosis 11/01/2012  . Pleural effusion 10/11/2011  . Aortic valvular stenosis, moderate to severe 06/24/2011  . Hyperlipidemia 06/21/2011  . Hypertensive heart disease with CHF (congestive heart failure) ([place]) 06/21/2011  . Hypothyroidism, goitrous 06/21/2011  . Coronary artery disease 06/21/2011   Past Surgical History:  Procedure Laterality Date  . ANGIOPLASTY    . APPENDECTOMY    . BACK SURGERY    . CARDIAC CATHETERIZATION  10/06/2009   Continue medical therapy  . CARDIAC CATHETERIZATION  04/07/2009   RCA stented with a 3x16mm stent resulting in a reduction of 90% narrowing to normal  . CARDIOVERSION  06/19/2011   Procedure: CARDIOVERSION;  Surgeon: Sanda Klein, MD;  Location: [facility];  Service: Cardiovascular;  Laterality: N/A;  . CARDIOVERSION  07/11/2010   Successful coversion to sinus rhythm  .  CARDIOVERSION N/A 08/10/2014   Procedure: CARDIOVERSION;  Surgeon: Sanda Klein, MD;  Location: [facility];  Service: Cardiovascular;  Laterality: N/A;  . CATARACT EXTRACTION W/ INTRAOCULAR LENS  IMPLANT, BILATERAL    . CORONARY ANGIOPLASTY WITH STENT PLACEMENT     "1" (10/30/2012)  . DILATION AND CURETTAGE OF UTERUS    . EXCISIONAL HEMORRHOIDECTOMY    . FRACTURE SURGERY    . HIP ARTHROPLASTY Right 04/17/2015   Procedure: HIP MONOPOLAR HIP HEMI ARTHROPLASTY;  Surgeon: Marybelle Killings,  MD;  Location: [facility];  Service: Orthopedics;  Laterality: Right;  . IR THORACENTESIS ASP PLEURAL SPACE W/IMG GUIDE  04/28/2018  . IR THORACENTESIS ASP PLEURAL SPACE W/IMG GUIDE  05/26/2018  . LUMBAR DISC SURGERY  X 2   "cleaned out scar tissue"   . SHOULDER ACROMIOPLASTY Left    "fell; put a new ball in" (10/30/2012)  . WRIST FRACTURE SURGERY Left     OB History   No obstetric history on file.    Family History  Problem Relation Age of Onset  . Heart disease Brother   . Heart disease Brother   . Lung disease Brother   . Heart disease Mother 74  . Cancer Mother 61       Uterine  . Stroke Mother 18  . Heart failure Mother   . Hypertension Mother   . Heart disease Father   . Heart attack Father   . Hypertension Father   . Hypertension Child 34  . Alcohol abuse Child   . Cancer Son   . Hypertension Sister   . Hypertension Brother    Social History   Tobacco Use  . Smoking status: Never Smoker  . Smokeless tobacco: Never Used  Substance Use Topics  . Alcohol use: No  . Drug use: No   Home Medications Prior to Admission medications   Medication Sig Start Date End Date Taking? Authorizing Provider  acetaminophen (TYLENOL) 650 MG CR tablet Take 650 mg by mouth 3 (three) times daily. Morning, supper and bedtime    [provider]  atorvastatin (LIPITOR) 40 MG tablet TAKE 1 TABLET DAILY AT 6 P.M. 02/19/19   Croitoru, Mihai, MD  augmented betamethasone dipropionate (DIPROLENE-AF) 0.05 % cream  01/05/19   [provider]  benzonatate (TESSALON) 100 MG capsule Take 1 capsule (100 mg total) by mouth 2 (two) times daily as needed for cough. 04/21/19   Rita Ohara, MD  Cholecalciferol (VITAMIN D) 50 MCG (2000 UT) tablet Take 2,000 Units by mouth daily.    [provider]  diphenhydramine-acetaminophen (ACETAMINOPHEN PM) 25-500 MG TABS tablet Take 1 tablet by mouth at bedtime as needed.    [provider]  ezetimibe (ZETIA) 10 MG tablet Take 1 tablet  (10 mg total) by mouth daily. 03/15/19   Croitoru, Mihai, MD  folic acid (FOLVITE) 1 MG tablet TAKE 2 TABLETS DAILY 04/30/19   Croitoru, Mihai, MD  ipratropium (ATROVENT) 0.03 % nasal spray Place 2 sprays into both nostrils 3 (three) times daily as needed for rhinitis. 04/12/19   Rita Ohara, MD  levothyroxine (SYNTHROID) 88 MCG tablet TAKE 1 TABLET DAILY BEFORE BREAKFAST 12/22/18   [name], MD  loratadine (CLARITIN) 10 MG tablet Take 10 mg by mouth daily.    [provider]  magnesium hydroxide (MILK OF MAGNESIA) 400 MG/5ML suspension Take 15 mLs by mouth daily as needed for mild constipation. 10/08/16   Lavina Hamman, MD  metoprolol tartrate (LOPRESSOR) 25 MG tablet TAKE 1  TABLET TWICE A DAY 05/13/19   Croitoru, Mihai, MD  multivitamin-iron-minerals-folic acid (CENTRUM) chewable tablet Chew 1 tablet by mouth daily.    [provider]  OVER THE COUNTER MEDICATION Take 1 tablet by mouth See admin instructions. Stool softener over the counter by mouth daily  Fiber    [provider]  OVER THE COUNTER MEDICATION Take 1 tablet by mouth See admin instructions. Over the counter pill for osteoarthritis by mouth three time daily    [provider]  spironolactone (ALDACTONE) 25 MG tablet Take 1 tablet (25 mg total) by mouth daily. 05/18/19   Croitoru, Mihai, MD  vitamin C (ASCORBIC ACID) 500 MG tablet Take 500 mg by mouth daily.    [provider]  warfarin (COUMADIN) 2.5 MG tablet TAKE 1/2 to 1 TABLET DAILY  AS DIRECTED BY COUMADIN CLINIC 04/30/19   Croitoru, Mihai, MD    Allergies    [name] [ethacrynic acid], Furosemide, Sulfa antibiotics, Tape, [name], and Torsemide  Review of Systems   Review of Systems All systems reviewed and negative, other than as noted in HPI.  Physical Exam Updated Vital Signs BP 115/76   Pulse (!) 104   Temp 98.4 F (36.9 C) (Oral)   Resp 16   SpO2 100%   Physical Exam Vitals and nursing note  reviewed.  Constitutional:      General: She is not in acute distress.    Appearance: She is well-developed.  HENT:     Head: Normocephalic and atraumatic.  Eyes:     General:        Right eye: No discharge.        Left eye: No discharge.     Conjunctiva/sclera: Conjunctivae normal.  Cardiovascular:     Rate and Rhythm: Normal rate and regular rhythm.     Heart sounds: Normal heart sounds. No murmur. No friction rub. No gallop.   Pulmonary:     Effort: Pulmonary effort is normal. No respiratory distress.     Breath sounds: Normal breath sounds.  Abdominal:     General: There is no distension.     Palpations: Abdomen is soft.     Tenderness: There is no abdominal tenderness.  Musculoskeletal:        General: No tenderness.     Cervical back: Neck supple.     Right lower leg: Edema present.     Left lower leg: Edema present.     Comments: Petechial rash to bilateral lower extremities, likely from compression stockings.  Skin:    General: Skin is warm and dry.  Neurological:     Mental Status: She is alert.  Psychiatric:        Behavior: Behavior normal.        Thought Content: Thought content normal.     ED Results / Procedures / Treatments   Labs (all labs ordered are listed, but only abnormal results are displayed) Labs Reviewed  BASIC METABOLIC PANEL - Abnormal; Notable for the following components:      Result Value   Glucose, Bld 101 (*)    Creatinine, Ser 1.09 (*)    GFR calc non Af Amer 44 (*)    GFR calc Af Amer 51 (*)    All other components within normal limits  CBC - Abnormal; Notable for the following components:   Hemoglobin 11.6 (*)    All other components within normal limits  BRAIN NATRIURETIC PEPTIDE - Abnormal; Notable for the following components:   B  Natriuretic Peptide 345.4 (*)    All other components within normal limits  PROTIME-INR - Abnormal; Notable for the following components:   Prothrombin Time 23.2 (*)    INR 2.1 (*)    All other  components within normal limits  SARS CORONAVIRUS 2 (TAT 6-24 HRS)    EKG EKG Interpretation  Date/Time:  Sunday May 30 2019 10:28:19 EST Ventricular Rate:  103 PR Interval:  118 QRS Duration: 128 QT Interval:  378 QTC Calculation: 495 R Axis:   -34 Text Interpretation: Atrial fibrillation Left axis deviation Left bundle branch block Abnormal ECG Confirmed by ,  ([id number]) on 05/30/2019 12:22:10 PM   Radiology DG Chest 2 View  Result Date: 05/30/2019 CLINICAL DATA:  Shortness of breath. Additional history provided: Bilateral lower extremity edema for 3-4 days, history of CHF. EXAM: CHEST - 2 VIEW COMPARISON:  Prior chest radiographs 05/28/2019 and earlier FINDINGS: Cardiomegaly, unchanged. Aortic atherosclerosis. Small left greater than right pleural effusions with left basilar atelectasis, not significantly changed since chest radiograph 05/28/2019. Chronic interstitial prominence with biapical pleuroparenchymal scarring. No evidence of pneumothorax. Partially visualized left shoulder prosthesis. No acute bony abnormality is identified. IMPRESSION: No significant interval change as compared to chest radiograph 05/28/2019. Small left greater than right pleural effusions with left basilar atelectasis. Pneumonia at the left lung base cannot be excluded. Unchanged cardiomegaly. Aortic atherosclerosis. Electronically Signed   By: Kyle  Golden DO   On: 05/30/2019 11:10   DG Chest Port 1 View  Result Date: 05/28/2019 CLINICAL DATA:  Bilateral lower extremity swelling. EXAM: PORTABLE CHEST 1 VIEW COMPARISON:  June 26, 2018. FINDINGS: Stable cardiomegaly. No pneumothorax is noted. Right lung is clear. Small left pleural effusion is noted with probable associated atelectasis or scarring. Bony thorax is unremarkable. IMPRESSION: Small left pleural effusion with probable associated atelectasis or scarring. Stable cardiomegaly. No pneumothorax is noted. Electronically Signed   By: James   Green Jr M.D.   On: 05/28/2019 13:52    Procedures Procedures (including critical care time)  Medications Ordered in ED Medications  sodium chloride flush (NS) 0.9 % injection 3 mL (has no administration in time range)    ED Course  I have reviewed the triage vital signs and the nursing notes.  Pertinent labs & imaging results that were available during my care of the patient were reviewed by me and considered in my medical decision making (see chart for details).    MDM Rules/Calculators/A&P                       89 yF with worsening dyspnea. Apparently she is allergic to both loop and thiazide diuretics. She is on an aldosterone antagonist but this has been increased w/o improvement of her symptoms. Cr stable and potassium is normal today. She feels like they are continuing to worsen. Reportedly o2 sats 80% on RA with EMS. On my evaluation her sats were high 90s-100% on 4L. I further turned her down to 2L. BNP 2d ago a little elevated. Will repeat for trending purposes. CXR essentially unchanged. She denies pain. Unlikely PE with therapeutic INR 2d ago. Denies infectious symptoms such as cough, fever, etc. I'm not sure of management options in setting of her reported allergies. Will discuss with cardiology.     Final Clinical Impression(s) / ED Diagnoses Final diagnoses:  Dyspnea, unspecified type  Congestive heart failure, unspecified HF chronicity, unspecified heart failure type ([place])    Rx / DC Orders ED Discharge Orders  None       Virgel Manifold, MD 05/31/19 [phone]

## 2019-05-30 NOTE — ED Triage Notes (Signed)
Pt to triage via [facility] from home.  Pt has bilateral lower extremity edema x 3-4 days.  Hx of CHF.  Seen at [facility] 1/1 and had spirolactone increased.  Swelling has not improved.  Pt had SOB with exertion today while doing housework.  Room air sats 80%.  Pt 99% on 15 liters by EMS.  Denies pain.

## 2019-05-30 NOTE — Progress Notes (Signed)
ANTICOAGULATION CONSULT NOTE - Initial Consult  Pharmacy Consult for Warfarin  Indication: atrial fibrillation  Allergies  Allergen Reactions  . [name] [Ethacrynic Acid] Nausea And Vomiting  . Furosemide Rash  . Sulfa Antibiotics Itching and Rash  . Tape Rash and Other (See Comments)    Tears skin off.  Please use "paper" tape only.   . [name] Rash    Per Dr Sallyanne Kuster, had a skin reaction with thiazides prior to use of Lasix  . Torsemide Rash    Patient Measurements:   Heparin Dosing Weight:   Vital Signs: Temp: 98.4 F (36.9 C) (01/03 1027) Temp Source: Oral (01/03 1027) BP: 154/92 (01/03 1830) Pulse Rate: 104 (01/03 1830)  Labs: Recent Labs    05/28/19 1345 05/30/19 1037  HGB 11.3* 11.6*  HCT 36.5 37.9  PLT 241 257  LABPROT 22.8* 23.2*  INR 2.0* 2.1*  CREATININE 0.99 1.09*    Estimated Creatinine Clearance: 26.7 mL/min (A) (by C-G formula based on SCr of 1.09 mg/dL (H)).   Medical History: Past Medical History:  Diagnosis Date  .  Acute respiratiory failure requiring BiPap 06/24/2011  . Allergy   . Aortic valvular stenosis, moderate to severe 06/24/2011   a. 10/2012 Echo: mod AS, Valve 1.25 cm^2 (VTI), 1.19cm^2 (Vmax).  . Arthritis    "back" (10/30/2012)  . Chronic diastolic CHF (congestive heart failure) ([place])    a. 10/2012 Echo: EF 55-60%, no rwma, Gr 2 DD, moderate AS, mod dil LA, mildly to mod dil RA.  Marland Kitchen Coronary artery disease    a. 03/2009 PCI RCA (3.0x18 BMS).  . Dyslipidemia   . Exertional shortness of breath   . Hypertension   . Hypothyroidism   . PAF (paroxysmal atrial fibrillation) ([place])    a. CHA2DS2VASc = 7-->chronic coumadin;  b. s/p DCCV 05/2011, 05/2012, 07/2014;  c. On amio.  . Rash    a. felt to be 2/2 lasix/torsemide in setting of sulfa allergy (lasix d/c ~ 06/2014, torsemide d/c 08/09/2014).    Medications:  Scheduled:  . atorvastatin  40 mg Oral q1800  . bumetanide  1 mg Oral Daily  . [START ON 05/31/2019] ezetimibe   10 mg Oral Daily  . [START ON 08/25/6604] folic acid  2 mg Oral Daily  . [START ON 05/31/2019] levothyroxine  88 mcg Oral QAC breakfast  . [START ON 05/31/2019] loratadine  10 mg Oral Daily  . metoprolol tartrate  25 mg Oral BID  . sodium chloride flush  3 mL Intravenous Once  . sodium chloride flush  3 mL Intravenous Q12H  . [START ON 05/31/2019] spironolactone  25 mg Oral Daily  . triamcinolone cream   Topical BID    Assessment: Patient is a 52 yof that has a PMH of CHF, CAD, Afib( on warfarin), HLD, and lymphoma. Patient presents to the ED with worsening CHF for evaluation. Pharmacy has been asked to continue warfarin on this patient.   PTA takes Warfarin 1.25 mg on MWF, and 2.5mg  ROW    Goal of Therapy:  INR 2-3 Monitor platelets by anticoagulation protocol: Yes   Plan:  - INR is currently therapeutic at 2.1 - Will continue home regimen and give Warfarin 2.5mg  PO x 1 dose  - Daily PT-INR and monitor CBC while on warfarin   Duanne Limerick PharmD.BCPS  05/30/2019,7:04 PM

## 2019-05-30 NOTE — H&P (Signed)
Cardiology Admission History and Physical:   Patient ID: [name] MRN: [id number]; DOB: Oct 19, 1923   Admission date: 05/30/2019  Primary Care Provider: [name], MD Primary Cardiologist: Sanda Klein, MD  Primary Electrophysiologist:  None   Chief Complaint:  SOB  Patient Profile:   [name] is a 86 y.o. female with a PMH of chronic diastolic CHF, CAD s/p remote BMS to RCA in 2010, moderate aortic stenosis, persistent atrial fibrillation, HTN, HLD, hypothyroidism, and low-grade lymphoma, who is being seen today for the evaluation of CHF at the request of Dr. Wilson Singer.  History of Present Illness:   [name] she was doing well until 4 days ago when she noticed weight gain and LE edema. She reports weights are typically 120lbs, however increased  She was seen at [facility] ED 05/28/2019 with complaints of LE edema, orthopnea, and weight gain. She was recommended to increase her spironolactone to 25mg  qAM and 12.5mg  qPM and avoid salty foods. Unfortunately her symptoms worsened, now with SOB, prompting her to present to [facility] ED for further evaluation.   She was last evaluated by cardiology at an outpatient visit with Dr. Sallyanne Kuster 12/2018, at which time she was doing well from a cardiac standpoint. She had no CHF complaints or chest pain. Dr. Loletha Grayer notes possible TTR amyloidosis, though due to her advanced age and limited therapeutic options, diagnosis was not pursued. Her kidney function had declined and her spironolactone was decreased to 25mg  daily at that time. Her CHF management has been complicated by allergies to loop/thiazide diuretics, as well as ethacrynic acid. Her last echo was 04/2018 and showed EF 55-60%, no RWMA, unable to assess LV diastolic function, moderate LAE, mild-moderate AS, and a small pericardial effusion.   ED course: tachycardic to 100s, tachypneic to 30s, O2 sats stable on O2 via [place], BP stable, afebrile. Labs notable for electrolytes wnl, Cr 1.09 (baseline),  Hgb 11.6 (baseline), PLT 257, BNP 345, INR 2.1. CXR with small L>R pleural effusions with left basilar atelectasis; PNA at [facility] cannot be excluded. EKG with suspected atrial flutter with variable AV block, rate 103, chronic LBBB. Cardiology asked to evaluate for CHF.   Heart Pathway Score:     Past Medical History:  Diagnosis Date  .  Acute respiratiory failure requiring BiPap 06/24/2011  . Allergy   . Aortic valvular stenosis, moderate to severe 06/24/2011   a. 10/2012 Echo: mod AS, Valve 1.25 cm^2 (VTI), 1.19cm^2 (Vmax).  . Arthritis    "back" (10/30/2012)  . Chronic diastolic CHF (congestive heart failure) ([place])    a. 10/2012 Echo: EF 55-60%, no rwma, Gr 2 DD, moderate AS, mod dil LA, mildly to mod dil RA.  Marland Kitchen Coronary artery disease    a. 03/2009 PCI RCA (3.0x18 BMS).  . Dyslipidemia   . Exertional shortness of breath   . Hypertension   . Hypothyroidism   . PAF (paroxysmal atrial fibrillation) ([place])    a. CHA2DS2VASc = 7-->chronic coumadin;  b. s/p DCCV 05/2011, 05/2012, 07/2014;  c. On amio.  . Rash    a. felt to be 2/2 lasix/torsemide in setting of sulfa allergy (lasix d/c ~ 06/2014, torsemide d/c 08/09/2014).    Past Surgical History:  Procedure Laterality Date  . ANGIOPLASTY    . APPENDECTOMY    . BACK SURGERY    . CARDIAC CATHETERIZATION  10/06/2009   Continue medical therapy  . CARDIAC CATHETERIZATION  04/07/2009   RCA stented with a 3x12mm stent resulting in a reduction of  90% narrowing to normal  . CARDIOVERSION  06/19/2011   Procedure: CARDIOVERSION;  Surgeon: Sanda Klein, MD;  Location: [facility];  Service: Cardiovascular;  Laterality: N/A;  . CARDIOVERSION  07/11/2010   Successful coversion to sinus rhythm  . CARDIOVERSION N/A 08/10/2014   Procedure: CARDIOVERSION;  Surgeon: Sanda Klein, MD;  Location: [facility];  Service: Cardiovascular;  Laterality: N/A;  . CATARACT EXTRACTION W/ INTRAOCULAR LENS  IMPLANT, BILATERAL    . CORONARY ANGIOPLASTY WITH STENT PLACEMENT     "1"  (10/30/2012)  . DILATION AND CURETTAGE OF UTERUS    . EXCISIONAL HEMORRHOIDECTOMY    . FRACTURE SURGERY    . HIP ARTHROPLASTY Right 04/17/2015   Procedure: HIP MONOPOLAR HIP HEMI ARTHROPLASTY;  Surgeon: Marybelle Killings, MD;  Location: [facility];  Service: Orthopedics;  Laterality: Right;  . IR THORACENTESIS ASP PLEURAL SPACE W/IMG GUIDE  04/28/2018  . IR THORACENTESIS ASP PLEURAL SPACE W/IMG GUIDE  05/26/2018  . LUMBAR DISC SURGERY  X 2   "cleaned out scar tissue"   . SHOULDER ACROMIOPLASTY Left    "fell; put a new ball in" (10/30/2012)  . WRIST FRACTURE SURGERY Left      Medications Prior to Admission: Prior to Admission medications   Medication Sig Start Date End Date Taking? Authorizing Provider  acetaminophen (TYLENOL) 650 MG CR tablet Take 650 mg by mouth 3 (three) times daily. Morning, supper and bedtime   Yes [provider]  atorvastatin (LIPITOR) 40 MG tablet TAKE 1 TABLET DAILY AT 6 P.M. Patient taking differently: Take 40 mg by mouth daily at 6 PM.  02/19/19  Yes Croitoru, Mihai, MD  augmented betamethasone dipropionate (DIPROLENE-AF) 0.05 % cream Apply 1 application topically daily.  01/05/19  Yes [provider]  Cholecalciferol (VITAMIN D) 50 MCG (2000 UT) tablet Take 2,000 Units by mouth daily.   Yes [provider]  diphenhydramine-acetaminophen (ACETAMINOPHEN PM) 25-500 MG TABS tablet Take 1 tablet by mouth at bedtime as needed.   Yes [provider]  ezetimibe (ZETIA) 10 MG tablet Take 1 tablet (10 mg total) by mouth daily. 03/15/19  Yes Croitoru, Mihai, MD  folic acid (FOLVITE) 1 MG tablet TAKE 2 TABLETS DAILY Patient taking differently: Take 2 mg by mouth daily.  04/30/19  Yes Croitoru, Mihai, MD  levothyroxine (SYNTHROID) 88 MCG tablet TAKE 1 TABLET DAILY BEFORE BREAKFAST Patient taking differently: Take 88 mcg by mouth daily before breakfast.  12/22/18  Yes [name], MD  loratadine (CLARITIN) 10 MG tablet Take 10 mg by mouth daily.   Yes  [provider]  metoprolol tartrate (LOPRESSOR) 25 MG tablet TAKE 1 TABLET TWICE A DAY Patient taking differently: Take 25 mg by mouth 2 (two) times daily.  05/13/19  Yes Croitoru, Mihai, MD  multivitamin-iron-minerals-folic acid (CENTRUM) chewable tablet Chew 1 tablet by mouth daily.   Yes [provider]  spironolactone (ALDACTONE) 25 MG tablet Take 1 tablet (25 mg total) by mouth daily. Patient taking differently: Take 12.5-25 mg by mouth 2 (two) times daily. 12.5mg  in the morning and 25mg  at bedtime. 05/18/19  Yes Croitoru, Mihai, MD  vitamin C (ASCORBIC ACID) 500 MG tablet Take 500 mg by mouth daily.   Yes [provider]  warfarin (COUMADIN) 2.5 MG tablet TAKE 1/2 to 1 TABLET DAILY  AS DIRECTED BY COUMADIN CLINIC Patient taking differently: See admin instructions. TAKE 1/2 to 1 TABLET DAILY  AS DIRECTED BY COUMADIN CLINIC - 2.5mg  on Sunday, Tues, Thurs, Saturday and 1.25mg  on  Monday, Wednesday, and Fridays. 04/30/19  Yes Croitoru, Mihai, MD  benzonatate (TESSALON) 100 MG capsule Take 1 capsule (100 mg total) by mouth 2 (two) times daily as needed for cough. Patient not taking: Reported on 05/30/2019 04/21/19   Rita Ohara, MD  ipratropium (ATROVENT) 0.03 % nasal spray Place 2 sprays into both nostrils 3 (three) times daily as needed for rhinitis. Patient not taking: Reported on 05/30/2019 04/12/19   Rita Ohara, MD  magnesium hydroxide (MILK OF MAGNESIA) 400 MG/5ML suspension Take 15 mLs by mouth daily as needed for mild constipation. Patient not taking: Reported on 05/30/2019 10/08/16   Lavina Hamman, MD  OVER THE COUNTER MEDICATION Take 1 tablet by mouth See admin instructions. Stool softener over the counter by mouth daily  Fiber    [provider]  OVER THE COUNTER MEDICATION Take 1 tablet by mouth See admin instructions. Over the counter pill for osteoarthritis by mouth three time daily    [provider]     Allergies:    Allergies  Allergen  Reactions  . [name] [Ethacrynic Acid] Nausea And Vomiting  . Furosemide Rash  . Sulfa Antibiotics Itching and Rash  . Tape Rash and Other (See Comments)    Tears skin off.  Please use "paper" tape only.   . [name] Rash    Per Dr Sallyanne Kuster, had a skin reaction with thiazides prior to use of Lasix  . Torsemide Rash    Social History:   Social History   Socioeconomic History  . Marital status: Married    Spouse name: Not on file  . Number of children: 1  . Years of education: Not on file  . Highest education level: Not on file  Occupational History  . Occupation: Retired    Fish farm manager: [employer]  Tobacco Use  . Smoking status: Never Smoker  . Smokeless tobacco: Never Used  Substance and Sexual Activity  . Alcohol use: No  . Drug use: No  . Sexual activity: Never    Birth control/protection: Abstinence  Other Topics Concern  . Not on file  Social History Narrative   Lives in [place], [place]. She got married in 1948.   Social Determinants of Health   Financial Resource Strain:   . Difficulty of Paying Living Expenses: Not on file  Food Insecurity:   . Worried About Charity fundraiser in the Last Year: Not on file  . Ran Out of Food in the Last Year: Not on file  Transportation Needs:   . Lack of Transportation (Medical): Not on file  . Lack of Transportation (Non-Medical): Not on file  Physical Activity:   . Days of Exercise per Week: Not on file  . Minutes of Exercise per Session: Not on file  Stress:   . Feeling of Stress : Not on file  Social Connections:   . Frequency of Communication with Friends and Family: Not on file  . Frequency of Social Gatherings with Friends and Family: Not on file  . Attends Religious Services: Not on file  . Active Member of Clubs or Organizations: Not on file  . Attends Archivist Meetings: Not on file  . Marital Status: Not on file  Intimate Partner Violence:   . Fear of Current or Ex-Partner: Not  on file  . Emotionally Abused: Not on file  . Physically Abused: Not on file  . Sexually Abused: Not on file    Family History:   The patient's family history includes  Alcohol abuse in her child; Cancer in her son; Cancer (age of onset: 54) in her mother; Heart attack in her father; Heart disease in her brother, brother, and father; Heart disease (age of onset: 14) in her mother; Heart failure in her mother; Hypertension in her brother, father, mother, and sister; Hypertension (age of onset: 4) in her child; Lung disease in her brother; Stroke (age of onset: 96) in her mother.    ROS:  Please see the history of present illness.  All other ROS reviewed and negative.     Physical Exam/Data:   Vitals:   05/30/19 1400 05/30/19 1430 05/30/19 1500 05/30/19 1530  BP: 119/69 111/63 119/66 (!) 121/56  Pulse: 61 (!) 103 79 71  Resp: (!) 21 (!) 24 (!) 29 (!) 30  Temp:      TempSrc:      SpO2: 100% 100% 100% 100%   No intake or output data in the 24 hours ending 05/30/19 1721 Last 3 Weights 05/28/2019 04/12/2019 04/06/2019  Weight (lbs) 126 lb 122 lb 122 lb  Weight (kg) 57.153 kg 55.339 kg 55.339 kg     There is no height or weight on file to calculate BMI.  General:  Well nourished, well developed, elderly female laying relatively flat in bed in no acute distress HEENT: sclera anicteric Neck: no JVD Vascular: No carotid bruits; distal pulses 2+ bilaterally   Cardiac:  normal S1, S2; RRR; +murmur, no rub or gallops Lungs:  Decreased breath sounds throughout with faint crackles at lung bases  Abd: soft, nontender, no hepatomegaly  Ext: trace to 1+ LE edema Musculoskeletal:  No deformities, BUE and BLE strength normal and equal Skin: warm and dry; blotchy skin on torso at baseline Neuro:  CNs 2-12 intact, no focal abnormalities noted Psych:  Normal affect    EKG:  suspected atrial flutter with variable AV block, rate 103, chronic LBBB. Rates in the 70s-100s on telemetry  Relevant CV  Studies: Echocardiogram 04/2018: - Left ventricle: The cavity size was normal. Wall thickness was increased in a pattern of mild LVH. Systolic function was normal. The estimated ejection fraction was in the range of 55% to 60%. Wall motion was normal; there were no regional wall motion abnormalities. The study is not technically sufficient to allow evaluation of LV diastolic function. Doppler parameters are consistent with high ventricular filling pressure. - Aortic valve: Valve mobility was restricted. There was mild to moderate stenosis. There was trivial regurgitation. - Mitral valve: Severely calcified annulus. Mildly thickened leaflets . - Left atrium: The atrium was moderately dilated. - Pulmonary arteries: Systolic pressure was mildly increased. PA peak pressure: 46 mm Hg (S). - Pericardium, extracardiac: A small pericardial effusion was identified. There was a left pleural effusion.  Impressions:  - Definity used; normal LV systolic function; mild LVH; calcified aortic valve with mild to moderate AS (mean gradient 18 mmHg); trace AI; moderate LAE; mild TR; mild pulmonary hypertension; probable small pericardial effusion.   Laboratory Data:  High Sensitivity Troponin:  No results for input(s): TROPONINIHS in the last 720 hours.    Chemistry Recent Labs  Lab 05/28/19 1345 05/30/19 1037  NA 142 141  K 4.4 4.2  CL 108 106  CO2 25 26  GLUCOSE 100* 101*  BUN 18 19  CREATININE 0.99 1.09*  CALCIUM 9.2 9.2  GFRNONAA 49* 44*  GFRAA 57* 51*  ANIONGAP 9 9    Recent Labs  Lab 05/28/19 1345  PROT 6.8  ALBUMIN 3.8  AST  21  ALT 17  ALKPHOS 77  BILITOT 1.7*   Hematology Recent Labs  Lab 05/28/19 1345 05/30/19 1037  WBC 8.9 9.0  RBC 3.82* 3.98  HGB 11.3* 11.6*  HCT 36.5 37.9  MCV 95.5 95.2  MCH 29.6 29.1  MCHC 31.0 30.6  RDW 13.7 13.6  PLT 241 257   BNP Recent Labs  Lab 05/28/19 1345 05/30/19 1037  BNP 387.5* 345.4*      DDimer No results for input(s): DDIMER in the last 168 hours.   Radiology/Studies:  DG Chest 2 View  Result Date: 05/30/2019 CLINICAL DATA:  Shortness of breath. Additional history provided: Bilateral lower extremity edema for 3-4 days, history of CHF. EXAM: CHEST - 2 VIEW COMPARISON:  Prior chest radiographs 05/28/2019 and earlier FINDINGS: Cardiomegaly, unchanged. Aortic atherosclerosis. Small left greater than right pleural effusions with left basilar atelectasis, not significantly changed since chest radiograph 05/28/2019. Chronic interstitial prominence with biapical pleuroparenchymal scarring. No evidence of pneumothorax. Partially visualized left shoulder prosthesis. No acute bony abnormality is identified. IMPRESSION: No significant interval change as compared to chest radiograph 05/28/2019. Small left greater than right pleural effusions with left basilar atelectasis. Pneumonia at the left lung base cannot be excluded. Unchanged cardiomegaly. Aortic atherosclerosis. Electronically Signed   By: Kellie Simmering DO   On: 05/30/2019 11:10    Assessment and Plan:   1. Acute on chronic diastolic CHF: patient presents with SOB, LE edema, weight gain, and orthopnea. BNP in the 300s. Pleural effusions noted on CXR. Management complicated by allergy to loop/thiazide diuretics, as well as ethacrynic acid. She has tolerated spironolactone outpatient.  - Will trial bumex 1mg  po (IV not available due to backorder) and monitor for response. She has a mild dermatitis on torso at baseline which may make it difficult to assess for an allergic rash which has been her previous reaction to thiazide/loop diuretics - Continue metoprolol - Continue spironolactone - Monitor daily weights and strict I&Os  2. CAD s/p BMS to RCA in 2010: no chest pain complaints. Not on ASA given need for anticoagulation - Continue metoprolol - Continue atorvastatin and zetia  3. Permanent atrial fibrillation/flutter: appears to  be in Aflutter with RVR, rate 103 on EKG today. INR therapeutic at 2.1 - Continue metoprolol for rate control - hopeful this will improve with diuresis - Continue coumadin per pharmacy for stroke ppx  4. HTN: BP stable - Continue metoprolol and spironolactone  5. HLD: LDL well controlled at 60 12/2018 - Continue atorvastatin and zetia  6. Hypothyroidism: TSH wnl 03/2018 - Will check TSH - Continue levothyroxine  Severity of Illness: The appropriate patient status for this patient is OBSERVATION. Observation status is judged to be reasonable and necessary in order to provide the required intensity of service to ensure the patient's safety. The patient's presenting symptoms, physical exam findings, and initial radiographic and laboratory data in the context of their medical condition is felt to place them at decreased risk for further clinical deterioration. Furthermore, it is anticipated that the patient will be medically stable for discharge from the hospital within 2 midnights of admission. The following factors support the patient status of observation.   " The patient's presenting symptoms include sob, LE edema, orthopnea, weight gain. " The physical exam findings include crackles on lung exam. " The initial radiographic and laboratory data are pleural effusions on CXR, BNP 300s.     For questions or updates, please contact [place] Please consult [email or web address] for contact info under  Signed, Abigail Butts, PA-C  05/30/2019 5:21 PM

## 2019-05-31 DIAGNOSIS — Z79890 Hormone replacement therapy (postmenopausal): Secondary | ICD-10-CM | POA: Diagnosis not present

## 2019-05-31 DIAGNOSIS — I272 Pulmonary hypertension, unspecified: Secondary | ICD-10-CM | POA: Diagnosis present

## 2019-05-31 DIAGNOSIS — N183 Chronic kidney disease, stage 3 unspecified: Secondary | ICD-10-CM | POA: Diagnosis present

## 2019-05-31 DIAGNOSIS — Z91048 Other nonmedicinal substance allergy status: Secondary | ICD-10-CM | POA: Diagnosis not present

## 2019-05-31 DIAGNOSIS — I5033 Acute on chronic diastolic (congestive) heart failure: Secondary | ICD-10-CM | POA: Diagnosis present

## 2019-05-31 DIAGNOSIS — Z20822 Contact with and (suspected) exposure to covid-19: Secondary | ICD-10-CM | POA: Diagnosis present

## 2019-05-31 DIAGNOSIS — M1990 Unspecified osteoarthritis, unspecified site: Secondary | ICD-10-CM | POA: Diagnosis present

## 2019-05-31 DIAGNOSIS — I480 Paroxysmal atrial fibrillation: Secondary | ICD-10-CM | POA: Diagnosis present

## 2019-05-31 DIAGNOSIS — I483 Typical atrial flutter: Secondary | ICD-10-CM

## 2019-05-31 DIAGNOSIS — Z79899 Other long term (current) drug therapy: Secondary | ICD-10-CM | POA: Diagnosis not present

## 2019-05-31 DIAGNOSIS — Z888 Allergy status to other drugs, medicaments and biological substances status: Secondary | ICD-10-CM | POA: Diagnosis not present

## 2019-05-31 DIAGNOSIS — I4892 Unspecified atrial flutter: Secondary | ICD-10-CM | POA: Diagnosis present

## 2019-05-31 DIAGNOSIS — I130 Hypertensive heart and chronic kidney disease with heart failure and stage 1 through stage 4 chronic kidney disease, or unspecified chronic kidney disease: Secondary | ICD-10-CM | POA: Diagnosis present

## 2019-05-31 DIAGNOSIS — C8590 Non-Hodgkin lymphoma, unspecified, unspecified site: Secondary | ICD-10-CM | POA: Diagnosis present

## 2019-05-31 DIAGNOSIS — I959 Hypotension, unspecified: Secondary | ICD-10-CM | POA: Diagnosis not present

## 2019-05-31 DIAGNOSIS — Z7901 Long term (current) use of anticoagulants: Secondary | ICD-10-CM | POA: Diagnosis not present

## 2019-05-31 DIAGNOSIS — L309 Dermatitis, unspecified: Secondary | ICD-10-CM | POA: Diagnosis present

## 2019-05-31 DIAGNOSIS — E039 Hypothyroidism, unspecified: Secondary | ICD-10-CM | POA: Diagnosis present

## 2019-05-31 DIAGNOSIS — I10 Essential (primary) hypertension: Secondary | ICD-10-CM | POA: Diagnosis not present

## 2019-05-31 DIAGNOSIS — Z889 Allergy status to unspecified drugs, medicaments and biological substances status: Secondary | ICD-10-CM | POA: Insufficient documentation

## 2019-05-31 DIAGNOSIS — Z8701 Personal history of pneumonia (recurrent): Secondary | ICD-10-CM | POA: Diagnosis not present

## 2019-05-31 DIAGNOSIS — E785 Hyperlipidemia, unspecified: Secondary | ICD-10-CM | POA: Diagnosis present

## 2019-05-31 DIAGNOSIS — Z955 Presence of coronary angioplasty implant and graft: Secondary | ICD-10-CM | POA: Diagnosis not present

## 2019-05-31 DIAGNOSIS — I4821 Permanent atrial fibrillation: Secondary | ICD-10-CM | POA: Diagnosis present

## 2019-05-31 DIAGNOSIS — I350 Nonrheumatic aortic (valve) stenosis: Secondary | ICD-10-CM | POA: Diagnosis present

## 2019-05-31 DIAGNOSIS — Z882 Allergy status to sulfonamides status: Secondary | ICD-10-CM | POA: Diagnosis not present

## 2019-05-31 DIAGNOSIS — I2510 Atherosclerotic heart disease of native coronary artery without angina pectoris: Secondary | ICD-10-CM | POA: Diagnosis present

## 2019-05-31 LAB — MAGNESIUM: Magnesium: 2.3 mg/dL (ref 1.7–2.4)

## 2019-05-31 LAB — BASIC METABOLIC PANEL
Anion gap: 12 (ref 5–15)
BUN: 23 mg/dL (ref 8–23)
CO2: 31 mmol/L (ref 22–32)
Calcium: 9.6 mg/dL (ref 8.9–10.3)
Chloride: 99 mmol/L (ref 98–111)
Creatinine, Ser: 1.13 mg/dL — ABNORMAL HIGH (ref 0.44–1.00)
GFR calc Af Amer: 49 mL/min — ABNORMAL LOW (ref >60)
GFR calc non Af Amer: 42 mL/min — ABNORMAL LOW (ref >60)
Glucose, Bld: 92 mg/dL (ref 70–99)
Potassium: 4.2 mmol/L (ref 3.5–5.1)
Sodium: 142 mmol/L (ref 135–145)

## 2019-05-31 LAB — PROTIME-INR
INR: 2.3 — ABNORMAL HIGH (ref 0.8–1.2)
Prothrombin Time: 24.9 seconds — ABNORMAL HIGH (ref 11.4–15.2)

## 2019-05-31 MED ORDER — NON FORMULARY: 2.5000 mg | Freq: Once | Status: DC

## 2019-05-31 MED ORDER — WARFARIN 1.25 MG HALF TABLET
1.2500 mg | ORAL_TABLET | Freq: Once | ORAL | Status: AC
  Administered 2019-05-31: 1.25 mg via ORAL
  Filled 2019-05-31 (×2): qty 1

## 2019-05-31 MED ORDER — LEVOTHYROXINE SODIUM 88 MCG PO TABS
88.0000 ug | ORAL_TABLET | Freq: Every day | ORAL | Status: DC
  Administered 2019-06-01: 88 ug via ORAL
  Filled 2019-05-31: qty 1

## 2019-05-31 MED ORDER — MELATONIN 3 MG PO TABS
3.0000 mg | ORAL_TABLET | Freq: Once | ORAL | Status: AC
  Administered 2019-05-31: 3 mg via ORAL
  Filled 2019-05-31: qty 1

## 2019-05-31 NOTE — Progress Notes (Signed)
ANTICOAGULATION CONSULT NOTE - Initial Consult  Pharmacy Consult for Warfarin  Indication: atrial fibrillation  Allergies  Allergen Reactions  . [name] [Ethacrynic Acid] Nausea And Vomiting  . Furosemide Rash  . Sulfa Antibiotics Itching and Rash  . Tape Rash and Other (See Comments)    Tears skin off.  Please use "paper" tape only.   . [name] Rash    Per Dr Sallyanne Kuster, had a skin reaction with thiazides prior to use of Lasix  . Torsemide Rash    Patient Measurements:   Heparin Dosing Weight:   Vital Signs: BP: 108/54 (01/04 0700) Pulse Rate: 72 (01/04 0728)  Labs: Recent Labs    05/28/19 1345 05/30/19 1037 05/31/19 0500  HGB 11.3* 11.6*  --   HCT 36.5 37.9  --   PLT 241 257  --   LABPROT 22.8* 23.2* 24.9*  INR 2.0* 2.1* 2.3*  CREATININE 0.99 1.09* 1.13*    Estimated Creatinine Clearance: 25.7 mL/min (A) (by C-G formula based on SCr of 1.13 mg/dL (H)).   Medical History: Past Medical History:  Diagnosis Date  .  Acute respiratiory failure requiring BiPap 06/24/2011  . Allergy   . Aortic valvular stenosis, moderate to severe 06/24/2011   a. 10/2012 Echo: mod AS, Valve 1.25 cm^2 (VTI), 1.19cm^2 (Vmax).  . Arthritis    "back" (10/30/2012)  . Chronic diastolic CHF (congestive heart failure) ([place])    a. 10/2012 Echo: EF 55-60%, no rwma, Gr 2 DD, moderate AS, mod dil LA, mildly to mod dil RA.  Marland Kitchen Coronary artery disease    a. 03/2009 PCI RCA (3.0x18 BMS).  . Dyslipidemia   . Exertional shortness of breath   . Hypertension   . Hypothyroidism   . PAF (paroxysmal atrial fibrillation) ([place])    a. CHA2DS2VASc = 7-->chronic coumadin;  b. s/p DCCV 05/2011, 05/2012, 07/2014;  c. On amio.  . Rash    a. felt to be 2/2 lasix/torsemide in setting of sulfa allergy (lasix d/c ~ 06/2014, torsemide d/c 08/09/2014).    Medications:  Scheduled:  . atorvastatin  40 mg Oral q1800  . bumetanide  1 mg Oral Daily  . ezetimibe  10 mg Oral Daily  . folic acid  2 mg Oral  Daily  . levothyroxine  88 mcg Oral QAC breakfast  . loratadine  10 mg Oral Daily  . metoprolol tartrate  25 mg Oral BID  . sodium chloride flush  3 mL Intravenous Once  . sodium chloride flush  3 mL Intravenous Q12H  . spironolactone  25 mg Oral Daily  . triamcinolone cream   Topical BID  . Warfarin - Pharmacist Dosing Inpatient   Does not apply q1800    Assessment: Patient is a 23 yof that has a PMH of CHF, CAD, Afib( on warfarin), HLD, and lymphoma. Patient presents to the ED with worsening CHF for evaluation. Pharmacy has been asked to continue warfarin on this patient.   PTA takes Warfarin 1.25 mg on MWF, and 2.5mg  ROW    Goal of Therapy:  INR 2-3 Monitor platelets by anticoagulation protocol: Yes   Plan:  - INR is currently therapeutic at 2.3 - Continue home regimen and give Warfarin 1.25mg  PO x 1 dose  - Daily PT-INR and monitor CBC while on warfarin   Duanne Limerick PharmD.BCPS  05/31/2019,10:10 AM

## 2019-05-31 NOTE — Progress Notes (Signed)
Progress Note  Patient Name: [name] Date of Encounter: 05/31/2019  Primary Cardiologist: Sanda Klein, MD   Subjective   Feeling better.  Breathing is improving but not yet at baseline.  Lives alone and feels too weak to go home.   Inpatient Medications    Scheduled Meds: . atorvastatin  40 mg Oral q1800  . bumetanide  1 mg Oral Daily  . ezetimibe  10 mg Oral Daily  . folic acid  2 mg Oral Daily  . levothyroxine  88 mcg Oral QAC breakfast  . loratadine  10 mg Oral Daily  . metoprolol tartrate  25 mg Oral BID  . sodium chloride flush  3 mL Intravenous Once  . sodium chloride flush  3 mL Intravenous Q12H  . spironolactone  25 mg Oral Daily  . triamcinolone cream   Topical BID  . Warfarin - Pharmacist Dosing Inpatient   Does not apply q1800   Continuous Infusions: . sodium chloride     PRN Meds: sodium chloride, acetaminophen, ondansetron (ZOFRAN) IV, sodium chloride flush   Vital Signs    Vitals:   05/31/19 0500 05/31/19 0600 05/31/19 0700 05/31/19 0728  BP: (!) 113/53 (!) 93/50 (!) 108/54   Pulse: 75 97 73 72  Resp: (!) 21 13 20  (!) 23  Temp:      TempSrc:      SpO2: 95% 96% 96% 100%    Intake/Output Summary (Last 24 hours) at 05/31/2019 0837 Last data filed at 05/31/2019 0732 Gross per 24 hour  Intake --  Output 1600 ml  Net -1600 ml   Last 3 Weights 05/28/2019 04/12/2019 04/06/2019  Weight (lbs) 126 lb 122 lb 122 lb  Weight (kg) 57.153 kg 55.339 kg 55.339 kg      Telemetry    Atrial flutter.  Rate controlled.  No events - Personally Reviewed  ECG    n/a - Personally Reviewed  Physical Exam   VS:  BP (!) 108/54   Pulse 72   Temp 98.4 F (36.9 C) (Oral)   Resp (!) 23   SpO2 100%  , BMI There is no height or weight on file to calculate BMI. GENERAL:  Frail, elderly woman in no acute distress.  Well-appearing HEENT: Pupils equal round and reactive, fundi not visualized, oral mucosa unremarkable NECK:  No jugular venous distention,  waveform within normal limits, carotid upstroke brisk and symmetric, no bruits LUNGS:  Clear to auscultation bilaterally HEART:  Irregularly irregular.  PMI not displaced or sustained,S1 and S2 within normal limits, no S3, no S4, no clicks, no rubs, II/VI systolic murmur at the LUSB ABD:  Flat, positive bowel sounds normal in frequency in pitch, no bruits, no rebound, no guarding, no midline pulsatile mass, no hepatomegaly, no splenomegaly EXT:  2 plus pulses throughout, no edema, no cyanosis no clubbing SKIN:  No rashes no nodules NEURO:  Cranial nerves II through XII grossly intact, motor grossly intact throughout PSYCH:  Cognitively intact, oriented to person place and time   Labs    High Sensitivity Troponin:  No results for input(s): TROPONINIHS in the last 720 hours.    Chemistry Recent Labs  Lab 05/28/19 1345 05/30/19 1037 05/31/19 0500  NA 142 141 142  K 4.4 4.2 4.2  CL 108 106 99  CO2 25 26 31   GLUCOSE 100* 101* 92  BUN 18 19 23   CREATININE 0.99 1.09* 1.13*  CALCIUM 9.2 9.2 9.6  PROT 6.8  --   --  ALBUMIN 3.8  --   --   AST 21  --   --   ALT 17  --   --   ALKPHOS 77  --   --   BILITOT 1.7*  --   --   GFRNONAA 49* 44* 42*  GFRAA 57* 51* 49*  ANIONGAP 9 9 12      Hematology Recent Labs  Lab 05/28/19 1345 05/30/19 1037  WBC 8.9 9.0  RBC 3.82* 3.98  HGB 11.3* 11.6*  HCT 36.5 37.9  MCV 95.5 95.2  MCH 29.6 29.1  MCHC 31.0 30.6  RDW 13.7 13.6  PLT 241 257    BNP Recent Labs  Lab 05/28/19 1345 05/30/19 1037  BNP 387.5* 345.4*     DDimer No results for input(s): DDIMER in the last 168 hours.   Radiology    DG Chest 2 View  Result Date: 05/30/2019 CLINICAL DATA:  Shortness of breath. Additional history provided: Bilateral lower extremity edema for 3-4 days, history of CHF. EXAM: CHEST - 2 VIEW COMPARISON:  Prior chest radiographs 05/28/2019 and earlier FINDINGS: Cardiomegaly, unchanged. Aortic atherosclerosis. Small left greater than right pleural  effusions with left basilar atelectasis, not significantly changed since chest radiograph 05/28/2019. Chronic interstitial prominence with biapical pleuroparenchymal scarring. No evidence of pneumothorax. Partially visualized left shoulder prosthesis. No acute bony abnormality is identified. IMPRESSION: No significant interval change as compared to chest radiograph 05/28/2019. Small left greater than right pleural effusions with left basilar atelectasis. Pneumonia at the left lung base cannot be excluded. Unchanged cardiomegaly. Aortic atherosclerosis. Electronically Signed   By: Kellie Simmering DO   On: 05/30/2019 11:10    Cardiac Studies   Echo 04/2018: Study Conclusions  - Left ventricle: The cavity size was normal. Wall thickness was   increased in a pattern of mild LVH. Systolic function was normal.   The estimated ejection fraction was in the range of 55% to 60%.   Wall motion was normal; there were no regional wall motion   abnormalities. The study is not technically sufficient to allow   evaluation of LV diastolic function. Doppler parameters are   consistent with high ventricular filling pressure. - Aortic valve: Valve mobility was restricted. There was mild to   moderate stenosis. There was trivial regurgitation. - Mitral valve: Severely calcified annulus. Mildly thickened   leaflets . - Left atrium: The atrium was moderately dilated. - Pulmonary arteries: Systolic pressure was mildly increased. PA   peak pressure: 46 mm Hg (S). - Pericardium, extracardiac: A small pericardial effusion was   identified. There was a left pleural effusion.  Patient Profile     [name] is a 58F with moderate aortic stenosis, CAD s/p PCI, moderate pulmonary hypertension, hypertension, hyperlipidemia, hypothyroidism, and low-grade lymphoma admitted with acute on chronic diastolic heart failure.  Assessment & Plan    # Acute on chronic diastolic heart failure: Volume status is much better.  She  still has some faint crackles at the base.  She tolerated bumex without rash.  Continue 1mg  daily and spironolactone.  She is off supplemental oxygen but feels too weak to go home.  Will ask PT to see her and make recommendations as she lives alone.   # Mild-moderate AS: Mean gradient 18 on 04/2018.  It doesn't sound severe on exam.  Diuresis as above.   # Essential hypertension: BP well-controlled on metoprolol and spironolactone.  # Hyperlipidemia: Continue atorvastatin and Zetia.  # Hypothyroidism: Continue levothyroxine.   # Atrial flutter; Rate is <  100 bpm.  Continue metoprolol and warfarin.   For questions or updates, please contact [place] Please consult [email or web address] for contact info under        Signed, Skeet Latch, MD  05/31/2019, 8:37 AM

## 2019-05-31 NOTE — ED Notes (Signed)
Updated patient's son Sheran Spine by telephone on patient condition. All questions answered at this time.

## 2019-05-31 NOTE — ED Notes (Signed)
ED TO INPATIENT HANDOFF REPORT  ED Nurse Name and Phone #: Lunette Stands [place] Name/Age/Gender [name] 86 y.o. female Room/Bed: 054C/054C  Code Status   Code Status: DNR  Home/SNF/Other Home Patient oriented to: self, place, time and situation Is this baseline? Yes   Triage Complete: Triage complete  Chief Complaint Acute on chronic diastolic CHF (congestive heart failure) ([place]) [I50.33]  Triage Note Pt to triage via [facility] from home.  Pt has bilateral lower extremity edema x 3-4 days.  Hx of CHF.  Seen at [facility] 1/1 and had spirolactone increased.  Swelling has not improved.  Pt had SOB with exertion today while doing housework.  Room air sats 80%.  Pt 99% on 15 liters by EMS.  Denies pain.    Allergies Allergies  Allergen Reactions  . [name] [Ethacrynic Acid] Nausea And Vomiting  . Furosemide Rash  . Sulfa Antibiotics Itching and Rash  . Tape Rash and Other (See Comments)    Tears skin off.  Please use "paper" tape only.   . [name] Rash    Per Dr Sallyanne Kuster, had a skin reaction with thiazides prior to use of Lasix  . Torsemide Rash    Level of Care/Admitting Diagnosis ED Disposition    ED Disposition Condition Comment   Admit  Hospital Area: [facility] [[id number]]  Level of Care: Telemetry Cardiac [103]  Covid Evaluation: Asymptomatic Screening Protocol (No Symptoms)  Diagnosis: Acute on chronic diastolic CHF (congestive heart failure) [facility]) [[id number]]  Admitting Physician: Skeet Latch [[id number]]  Attending Physician: Skeet Latch [[id number]]       B Medical/Surgery History Past Medical History:  Diagnosis Date  .  Acute respiratiory failure requiring BiPap 06/24/2011  . Allergy   . Aortic valvular stenosis, moderate to severe 06/24/2011   a. 10/2012 Echo: mod AS, Valve 1.25 cm^2 (VTI), 1.19cm^2 (Vmax).  . Arthritis    "back" (10/30/2012)  . Chronic diastolic CHF (congestive heart failure) ([place])    a. 10/2012  Echo: EF 55-60%, no rwma, Gr 2 DD, moderate AS, mod dil LA, mildly to mod dil RA.  Marland Kitchen Coronary artery disease    a. 03/2009 PCI RCA (3.0x18 BMS).  . Dyslipidemia   . Exertional shortness of breath   . Hypertension   . Hypothyroidism   . PAF (paroxysmal atrial fibrillation) ([place])    a. CHA2DS2VASc = 7-->chronic coumadin;  b. s/p DCCV 05/2011, 05/2012, 07/2014;  c. On amio.  . Rash    a. felt to be 2/2 lasix/torsemide in setting of sulfa allergy (lasix d/c ~ 06/2014, torsemide d/c 08/09/2014).   Past Surgical History:  Procedure Laterality Date  . ANGIOPLASTY    . APPENDECTOMY    . BACK SURGERY    . CARDIAC CATHETERIZATION  10/06/2009   Continue medical therapy  . CARDIAC CATHETERIZATION  04/07/2009   RCA stented with a 3x95mm stent resulting in a reduction of 90% narrowing to normal  . CARDIOVERSION  06/19/2011   Procedure: CARDIOVERSION;  Surgeon: Sanda Klein, MD;  Location: [facility];  Service: Cardiovascular;  Laterality: N/A;  . CARDIOVERSION  07/11/2010   Successful coversion to sinus rhythm  . CARDIOVERSION N/A 08/10/2014   Procedure: CARDIOVERSION;  Surgeon: Sanda Klein, MD;  Location: [facility];  Service: Cardiovascular;  Laterality: N/A;  . CATARACT EXTRACTION W/ INTRAOCULAR LENS  IMPLANT, BILATERAL    . CORONARY ANGIOPLASTY WITH STENT PLACEMENT     "1" (10/30/2012)  . DILATION AND CURETTAGE OF UTERUS    .  EXCISIONAL HEMORRHOIDECTOMY    . FRACTURE SURGERY    . HIP ARTHROPLASTY Right 04/17/2015   Procedure: HIP MONOPOLAR HIP HEMI ARTHROPLASTY;  Surgeon: Marybelle Killings, MD;  Location: [facility];  Service: Orthopedics;  Laterality: Right;  . IR THORACENTESIS ASP PLEURAL SPACE W/IMG GUIDE  04/28/2018  . IR THORACENTESIS ASP PLEURAL SPACE W/IMG GUIDE  05/26/2018  . LUMBAR DISC SURGERY  X 2   "cleaned out scar tissue"   . SHOULDER ACROMIOPLASTY Left    "fell; put a new ball in" (10/30/2012)  . WRIST FRACTURE SURGERY Left      A IV Location/Drains/Wounds Patient Lines/Drains/Airways  Status   Active Line/Drains/Airways    Name:   Placement date:   Placement time:   Site:   Days:   Peripheral IV 05/30/19 Left Forearm   05/30/19    1024    Forearm   1          Intake/Output Last 24 hours  Intake/Output Summary (Last 24 hours) at 05/31/2019 1525 Last data filed at 05/31/2019 0732 Gross per 24 hour  Intake --  Output 1600 ml  Net -1600 ml    Labs/Imaging Results for orders placed or performed during the hospital encounter of 05/30/19 (from the past 48 hour(s))  Basic metabolic panel     Status: Abnormal   Collection Time: 05/30/19 10:37 AM  Result Value Ref Range   Sodium 141 135 - 145 mmol/L   Potassium 4.2 3.5 - 5.1 mmol/L   Chloride 106 98 - 111 mmol/L   CO2 26 22 - 32 mmol/L   Glucose, Bld 101 (H) 70 - 99 mg/dL   BUN 19 8 - 23 mg/dL   Creatinine, Ser 1.09 (H) 0.44 - 1.00 mg/dL   Calcium 9.2 8.9 - 10.3 mg/dL   GFR calc non Af Amer 44 (L) >60 mL/min   GFR calc Af Amer 51 (L) >60 mL/min   Anion gap 9 5 - 15    Comment: Performed at [facility], [place] [address] [place], [place] [address]  CBC     Status: Abnormal   Collection Time: 05/30/19 10:37 AM  Result Value Ref Range   WBC 9.0 4.0 - 10.5 K/uL   RBC 3.98 3.87 - 5.11 MIL/uL   Hemoglobin 11.6 (L) 12.0 - 15.0 g/dL   HCT 37.9 36.0 - 46.0 %   MCV 95.2 80.0 - 100.0 fL   MCH 29.1 26.0 - 34.0 pg   MCHC 30.6 30.0 - 36.0 g/dL   RDW 13.6 11.5 - 15.5 %   Platelets 257 150 - 400 K/uL   nRBC 0.0 0.0 - 0.2 %    Comment: Performed at [facility], [place] [address], [place], [place] [address]  Brain natriuretic peptide     Status: Abnormal   Collection Time: 05/30/19 10:37 AM  Result Value Ref Range   B Natriuretic Peptide 345.4 (H) 0.0 - 100.0 pg/mL    Comment: Performed at [facility] [address] [place], [place] [address]  Protime-INR     Status: Abnormal   Collection Time: 05/30/19 10:37 AM  Result Value Ref Range   Prothrombin Time 23.2 (H) 11.4 - 15.2 seconds   INR 2.1 (H) 0.8 -  1.2    Comment: (NOTE) INR goal varies based on device and disease states. Performed at [facility], [place] [address], [place], [place] [address]   SARS CORONAVIRUS 2 (TAT 6-24 HRS) Nasopharyngeal Nasopharyngeal Swab  Status: None   Collection Time: 05/30/19 12:06 PM   Specimen: Nasopharyngeal Swab  Result Value Ref Range   SARS Coronavirus 2 NEGATIVE NEGATIVE    Comment: (NOTE) SARS-CoV-2 target nucleic acids are NOT DETECTED. The SARS-CoV-2 RNA is generally detectable in upper and lower respiratory specimens during the acute phase of infection. Negative results do not preclude SARS-CoV-2 infection, do not rule out co-infections with other pathogens, and should not be used as the sole basis for treatment or other patient management decisions. Negative results must be combined with clinical observations, patient history, and epidemiological information. The expected result is Negative. Fact Sheet for Patients: SugarRoll.be Fact Sheet for Healthcare Providers: https://www.woods-mathews.com/ This test is not yet approved or cleared by the [place] FDA and  has been authorized for detection and/or diagnosis of SARS-CoV-2 by FDA under an Emergency Use Authorization (EUA). This EUA will remain  in effect (meaning this test can be used) for the duration of the COVID-19 declaration under Section 56 4(b)(1) of the Act, 21 U.S.C. section 360bbb-3(b)(1), unless the authorization is terminated or revoked sooner. Performed at [facility], [place] [address] [place], [place] [address]   Basic metabolic panel     Status: Abnormal   Collection Time: 05/31/19  5:00 AM  Result Value Ref Range   Sodium 142 135 - 145 mmol/L   Potassium 4.2 3.5 - 5.1 mmol/L   Chloride 99 98 - 111 mmol/L   CO2 31 22 - 32 mmol/L   Glucose, Bld 92 70 - 99 mg/dL   BUN 23 8 - 23 mg/dL   Creatinine, Ser 1.13 (H) 0.44 - 1.00 mg/dL   Calcium 9.6 8.9 - 10.3  mg/dL   GFR calc non Af Amer 42 (L) >60 mL/min   GFR calc Af Amer 49 (L) >60 mL/min   Anion gap 12 5 - 15    Comment: Performed at [facility], [place] [address], [place], [place] [address]  Magnesium     Status: None   Collection Time: 05/31/19  5:00 AM  Result Value Ref Range   Magnesium 2.3 1.7 - 2.4 mg/dL    Comment: Performed at [facility], [place] [address], [place], [place] [address]  Protime-INR     Status: Abnormal   Collection Time: 05/31/19  5:00 AM  Result Value Ref Range   Prothrombin Time 24.9 (H) 11.4 - 15.2 seconds   INR 2.3 (H) 0.8 - 1.2    Comment: (NOTE) INR goal varies based on device and disease states. Performed at [facility], [place] [address], [place], [place] [address]    DG Chest 2 View  Result Date: 05/30/2019 CLINICAL DATA:  Shortness of breath. Additional history provided: Bilateral lower extremity edema for 3-4 days, history of CHF. EXAM: CHEST - 2 VIEW COMPARISON:  Prior chest radiographs 05/28/2019 and earlier FINDINGS: Cardiomegaly, unchanged. Aortic atherosclerosis. Small left greater than right pleural effusions with left basilar atelectasis, not significantly changed since chest radiograph 05/28/2019. Chronic interstitial prominence with biapical pleuroparenchymal scarring. No evidence of pneumothorax. Partially visualized left shoulder prosthesis. No acute bony abnormality is identified. IMPRESSION: No significant interval change as compared to chest radiograph 05/28/2019. Small left greater than right pleural effusions with left basilar atelectasis. Pneumonia at the left lung base cannot be excluded. Unchanged cardiomegaly. Aortic atherosclerosis. Electronically Signed   By: Kellie Simmering DO   On: 05/30/2019 11:10    Pending Labs Unresulted Labs (From admission, onward)  Start     Ordered   05/31/19 2993  Basic metabolic panel  Daily,   R     05/30/19 1758   05/31/19 0500  Protime-INR  Daily,   R     05/30/19 1917   05/30/19 1759   TSH  Add-on,   AD     05/30/19 1758          Vitals/Pain Today's Vitals   05/31/19 1100 05/31/19 1200 05/31/19 1300 05/31/19 1400  BP: 100/64 106/65 (!) 96/46 (!) 88/60  Pulse: 77 70 76   Resp: 20 19 17 16   Temp:      TempSrc:      SpO2: 98% 96% 96%   PainSc:        Isolation Precautions No active isolations  Medications Medications  sodium chloride flush (NS) 0.9 % injection 3 mL (3 mLs Intravenous Not Given 05/30/19 1100)  bumetanide (BUMEX) tablet 1 mg (1 mg Oral Given 05/31/19 1235)  atorvastatin (LIPITOR) tablet 40 mg (40 mg Oral Given 05/30/19 1802)  ezetimibe (ZETIA) tablet 10 mg (10 mg Oral Given 05/31/19 1236)  metoprolol tartrate (LOPRESSOR) tablet 25 mg (25 mg Oral Given 05/31/19 1237)  spironolactone (ALDACTONE) tablet 25 mg (25 mg Oral Given 05/31/19 1237)  levothyroxine (SYNTHROID) tablet 88 mcg (88 mcg Oral Not Given 11/25/67 6789)  folic acid (FOLVITE) tablet 2 mg (2 mg Oral Given 05/31/19 1236)  loratadine (CLARITIN) tablet 10 mg (10 mg Oral Given 05/31/19 1237)  triamcinolone cream (KENALOG) 0.5 % ( Topical Not Given 05/31/19 1241)  sodium chloride flush (NS) 0.9 % injection 3 mL (3 mLs Intravenous Not Given 05/31/19 1152)  sodium chloride flush (NS) 0.9 % injection 3 mL (has no administration in time range)  0.9 %  sodium chloride infusion (has no administration in time range)  acetaminophen (TYLENOL) tablet 650 mg (has no administration in time range)  ondansetron (ZOFRAN) injection 4 mg (4 mg Intravenous Given 05/30/19 2119)  Warfarin - Pharmacist Dosing Inpatient (has no administration in time range)  warfarin (COUMADIN) tablet 1.25 mg (has no administration in time range)  warfarin (COUMADIN) tablet 2.5 mg (2.5 mg Oral Given 05/30/19 1952)    Mobility walks with device Moderate fall risk   Focused Assessments Cardiac Assessment Handoff:    Lab Results  Component Value Date   CKTOTAL 45 06/22/2011   CKMB 2.8 06/22/2011   TROPONINI <0.03 04/26/2018   No results  found for: DDIMER Does the Patient currently have chest pain? No     R Recommendations: See Admitting Provider Note  Report given to:   Additional Notes: N/A

## 2019-05-31 NOTE — ED Notes (Signed)
Breakfast ordered 

## 2019-05-31 NOTE — ED Notes (Signed)
Pt oxygen off when RN went in room. Sats 100%. Oxygen d/ced to see if continues to maintain.

## 2019-06-01 DIAGNOSIS — I4892 Unspecified atrial flutter: Secondary | ICD-10-CM

## 2019-06-01 LAB — BASIC METABOLIC PANEL
Anion gap: 11 (ref 5–15)
BUN: 35 mg/dL — ABNORMAL HIGH (ref 8–23)
CO2: 29 mmol/L (ref 22–32)
Calcium: 9.3 mg/dL (ref 8.9–10.3)
Chloride: 101 mmol/L (ref 98–111)
Creatinine, Ser: 1.23 mg/dL — ABNORMAL HIGH (ref 0.44–1.00)
GFR calc Af Amer: 44 mL/min — ABNORMAL LOW (ref >60)
GFR calc non Af Amer: 38 mL/min — ABNORMAL LOW (ref >60)
Glucose, Bld: 111 mg/dL — ABNORMAL HIGH (ref 70–99)
Potassium: 4.1 mmol/L (ref 3.5–5.1)
Sodium: 141 mmol/L (ref 135–145)

## 2019-06-01 LAB — PROTIME-INR
INR: 2.4 — ABNORMAL HIGH (ref 0.8–1.2)
Prothrombin Time: 26.3 seconds — ABNORMAL HIGH (ref 11.4–15.2)

## 2019-06-01 MED ORDER — SPIRONOLACTONE 25 MG PO TABS: 25.0000 mg | ORAL_TABLET | Freq: Every day | ORAL | 3 refills | Status: DC

## 2019-06-01 MED ORDER — BUMETANIDE 1 MG PO TABS: 1.0000 mg | ORAL_TABLET | Freq: Every day | ORAL | 11 refills | Status: DC | PRN

## 2019-06-01 MED ORDER — WARFARIN SODIUM 2.5 MG PO TABS: 2.5000 mg | ORAL_TABLET | ORAL | Status: DC

## 2019-06-01 MED ORDER — WARFARIN 1.25 MG HALF TABLET: 1.2500 mg | ORAL_TABLET | ORAL | Status: DC

## 2019-06-01 NOTE — TOC Transition Note (Signed)
Transition of Care [facility]) - CM/SW Discharge Note   Patient Details  Name: [name] MRN: [id number] Date of Birth: 1924/01/15  [facility] CM/SW Contact:  Zenon Mayo, RN Phone Number: 06/01/2019, 4:11 PM   Clinical Narrative:    NCM offered choice to patient , she did not have a preference, NCM made referral to [place] with [facility] for [facility] [place], they were able to take referral.  Soc will begin 24 to 48 hrs post dc.     Final next level of care: [place] Barriers to Discharge: No Barriers Identified   Patient Goals and CMS Choice Patient states their goals for this hospitalization and ongoing recovery are:: go home CMS Medicare.gov Compare Post Acute Care list provided to:: Patient Choice offered to / list presented to : Patient  Discharge Placement                       Discharge Plan and Services                DME Arranged: (NA)         HH Arranged: RN, PT, Disease Management [place] Agency: [facility] [employer] Date [place]: 06/01/19 Time [place]: 1611 Representative spoke with at [place]: [place] ([place]) Interventions     Readmission Risk Interventions No flowsheet data found.

## 2019-06-01 NOTE — Progress Notes (Signed)
Pt has orders to be discharged. Discharge instructions given and pt has no additional questions at this time. Medication regimen reviewed and pt educated. Pt verbalized understanding and has no additional questions. Telemetry box removed. IV removed and site in good condition. Pt stable and waiting for transportation from her son who is on the way.

## 2019-06-01 NOTE — Evaluation (Signed)
Physical Therapy Evaluation Patient Details Name: [name] MRN: [id number] DOB: 02-27-1924 Today's Date: 06/01/2019   History of Present Illness  Pt is a 86 y.o. female admitted 05/30/19 with acute on chronic dialostic HF. PMH includes CAD, moderate aortic stenosis, pulmonary hypertension, HTN, HLD.    Clinical Impression  Pt presents with an overall decrease in functional mobility secondary to above. PTA, pt independent, active and lives alone; family lives nearby and assists with driving and household tasks as needed. Today, pt moving well at supervision-level. Denies dizziness or palpitations with activity (see BP values below); HR 86-138 with mobility. Pt would benefit from continued acute PT services to maximize functional mobility and independence prior to d/c with HHPT services.   Orthostatic BPs Supine 99/74  Sitting 93/60  Standing 100/64  Standing after 2 min 107/70  Post-ambulation 113/78      Follow Up Recommendations Home health PT;Supervision - Intermittent    Equipment Recommendations  None recommended by PT    Recommendations for Other Services       Precautions / Restrictions Precautions Precautions: Fall Precaution Comments: Low BP, watch tachycardia Restrictions Weight Bearing Restrictions: No      Mobility  Bed Mobility Overal bed mobility: Modified Independent             General bed mobility comments: HOB slightly elevated  Transfers Overall transfer level: Needs assistance Equipment used: None Transfers: Sit to/from Stand Sit to Stand: Supervision            Ambulation/Gait Ambulation/Gait assistance: Supervision Gait Distance (Feet): 250 Feet Assistive device: None Gait Pattern/deviations: Step-through pattern;Decreased stride length;Decreased stance time - left   Gait velocity interpretation: <1.8 ft/sec, indicate of risk for recurrent falls General Gait Details: Slow, steady gait with supervision for safety; no overt  instability or LOB. HR up to 138  Stairs            Wheelchair Mobility    Modified Rankin (Stroke Patients Only)       Balance Overall balance assessment: Needs assistance   Sitting balance-Leahy Scale: Good Sitting balance - Comments: Indep to don socks with increased time     Standing balance-Leahy Scale: Fair Standing balance comment: Prolonged standing at sink to perform ADL tasks, intermittent UE support on counter for stability                             Pertinent Vitals/Pain Pain Assessment: Faces Faces Pain Scale: Hurts a little bit Pain Location: BLEs Pain Descriptors / Indicators: Cramping;Sore Pain Intervention(s): Monitored during session    Home Living Family/patient expects to be discharged to:: Private residence Living Arrangements: Alone Available Help at Discharge: Family;Available PRN/intermittently Type of Home: House Home Access: Stairs to enter Entrance Stairs-Rails: Right Entrance Stairs-Number of Steps: 1 Home Layout: One level Home Equipment: Tub bench;Walker - 2 wheels;Hand held shower head;Grab bars - tub/shower      Prior Function Level of Independence: Needs assistance   Gait / Transfers Assistance Needed: Independent ambulation without DME; indep with basic household tasks. Does not drive. Denies any falls the past year  ADL's / Homemaking Assistance Needed: Daughter-in-law delivers groceries and easy to make meals, family assists with household tasks as needed, daughter-in-law manages medications weekly        Hand Dominance        Extremity/Trunk Assessment   Upper Extremity Assessment Upper Extremity Assessment: Generalized weakness    Lower Extremity Assessment Lower Extremity  Assessment: Generalized weakness       Communication   Communication: No difficulties  Cognition Arousal/Alertness: Awake/alert Behavior During Therapy: WFL for tasks assessed/performed Overall Cognitive Status: Within  Functional Limits for tasks assessed                                        General Comments General comments (skin integrity, edema, etc.): Supine BP 99/74, sitting BP 93/60, standing BP 100/65, standing 2 min BP 107/70, post-ambulation BP 113/78, pt asymptomatic; HR 86-138    Exercises     Assessment/Plan    PT Assessment Patient needs continued PT services  PT Problem List Decreased strength;Decreased activity tolerance;Decreased balance;Decreased mobility;Cardiopulmonary status limiting activity       PT Treatment Interventions DME instruction;Gait training;Stair training;Functional mobility training;Therapeutic activities;Therapeutic exercise;Balance training;Patient/family education    PT Goals (Current goals can be found in the Care Plan section)  Acute Rehab PT Goals Patient Stated Goal: Return home, does not want family to have to stay with her for initial 24/7 PT Goal Formulation: With patient Time For Goal Achievement: 06/15/19 Potential to Achieve Goals: Good    Frequency Min 3X/week   Barriers to discharge        Co-evaluation               AM-PAC PT "6 Clicks" Mobility  Outcome Measure Help needed turning from your back to your side while in a flat bed without using bedrails?: None Help needed moving from lying on your back to sitting on the side of a flat bed without using bedrails?: None Help needed moving to and from a bed to a chair (including a wheelchair)?: None Help needed standing up from a chair using your arms (e.g., wheelchair or bedside chair)?: None Help needed to walk in hospital room?: A Little Help needed climbing 3-5 steps with a railing? : A Little 6 Click Score: 22    End of Session Equipment Utilized During Treatment: Gait belt Activity Tolerance: Patient tolerated treatment well Patient left: in bed;with call bell/phone within reach;with bed alarm set Nurse Communication: Mobility status PT Visit Diagnosis:  Other abnormalities of gait and mobility (R26.89);Muscle weakness (generalized) (M62.81)    Time: 4580-9983 PT Time Calculation (min) (ACUTE ONLY): 34 min   Charges:   PT Evaluation $PT Eval Moderate Complexity: [place], PT, DPT Acute Rehabilitation Services  Pager [phone] Office [phone]  Derry Lory 06/01/2019, 12:33 PM

## 2019-06-01 NOTE — Progress Notes (Addendum)
   Vital Signs MEWS/VS Documentation      06/01/2019 0100 06/01/2019 0300 06/01/2019 0400 06/01/2019 0754   MEWS Score:  1  2  1  2    MEWS Score Color:  Green  Yellow  Green  Yellow   Resp:  17  18  18  17    Pulse:  74  91  94  (!) 121   BP:  --  --  (!) 92/59  (!) 124/105   Temp:  --  --  (!) 97.4 F (36.3 C)  (!) 97.5 F (36.4 C)   O2 Device:  --  --  Room Air  Room Air      Pt's HR sinus tachy from 120-140s. Morning medications given at this time.   Vitals per unit protocol in place.  Pt denies chest pain, shortness of breath or dizziness.  Pt has Hx of a-fib. Current vitals:   06/01/19 0811  Vitals  BP 105/74  MAP (mmHg) 85  BP Location Right Arm  BP Method Automatic  Patient Position (if appropriate) Lying  Pulse Rate (!) 107  Pulse Rate Source Monitor  ECG Heart Rate (!) 114   NP paged.    Julianne Handler 06/01/2019,8:06 AM

## 2019-06-01 NOTE — Discharge Summary (Signed)
Discharge Summary    Patient ID: [name] MRN: [id number]; DOB: 1923/09/07  Admit date: 05/30/2019 Discharge date: 06/01/2019  Primary Care Provider: [name], MD  Primary Cardiologist: Sheryl Klein, MD  Primary Electrophysiologist:  None   Discharge Diagnoses    Principal Problem:   Acute on chronic diastolic CHF (congestive heart failure) ([place]) Active Problems:   Aortic valvular stenosis, moderate to severe   Essential hypertension   Atrial flutter ([facility])   Diagnostic Studies/Procedures    None _____________   History of Present Illness    [name] a 86 y.o.femalewith a PMH of chronic diastolic CHF, CAD s/p remote BMS to RCA in 2010, moderate aortic stenosis, persistent atrial fibrillation, HTN, HLD, hypothyroidism, and low-grade lymphoma,who was admitted to the hospital on 05/30/2019 for management of volume overload.  [name] was doing well until 4 days prior to admission when she noticed weight gain and LE edema.She reported weights are typically 120lbs, however increased.She was seen at [facility] ED 05/28/2019 with complaints of LE edema, orthopnea, and weight gain. She was recommended to increase her spironolactone to 25mg  qAM and 12.5mg  qPM and avoid salty foods. Unfortunately hersymptoms worsened, with SOB, prompting her to present to [facility] ED for further evaluation.   She was last evaluated by cardiology at an outpatient visit with [name] 12/2018, at which time she was doing well from a cardiac standpoint. She had no CHF complaints or chest pain. [name] notes possible TTR amyloidosis, though due to her advanced age and limited therapeutic options, diagnosis was not pursued. Her kidney function had declined and her spironolactone was decreased to 25mg  daily at that time. Her CHF management has been complicated by allergies to loop/thiazide diuretics, as well as ethacrynic acid. Her last echo was 04/2018 and showed EF 55-60%, no RWMA, unable to  assess LV diastolic function, moderate LAE, mild-moderate AS, and a small pericardial effusion.   The patient was admitted for further management of volume overload.  Hospital Course     Consultants: None  The patient was mildly tachycardic and tachypneic on presentation with stable O2 sats.  BNP was 345.  Chest x-ray showed small L>R pleural effusions with left basilar atelectasis, pneumonia of left lower lobe could not be excluded.  EKG showed suspected atrial flutter with variable AV conduction, rate 103 bpm with chronic left bundle branch block.  She continued on anticoagulation with warfarin and her INR was therapeutic.  Patient was started on a trial of Bumex 1 mg daily which thankfully she has tolerated well.  She was continued on spironolactone 25 mg daily.  She had good urine output with a net -2 L fluid balance.  Weight is down 7 pounds, 129 > 122.  Her creatinine rose a little bit today from 0.99 on admission to 1.23.  We will stop Bumex and prescribed that patient can take 1 mg daily as needed for weight gain of 2 pounds in 1 day or 5 pounds in 1 week, or for lower extremity edema, or shortness of breath.  The patient continues in atrial flutter with rates controlled on metoprolol.  She did have elevated heart rates into the 130s this morning after metoprolol was held last night for low blood pressure.  Currently heart rates are in the 80s.  The nurse notes that her heart rate goes up with eating but comes back down when she is finished.  The patient has mild to moderate aortic stenosis with mean gradient of 18 by echocardiogram  in 04/2018.  Exam did not reveal severe murmur.  Blood pressure has been well controlled on metoprolol and spironolactone.  She continues on her home atorvastatin and Zetia.  Continue levothyroxine for hypothyroidism.   The patient was seen by physical therapy for decreased functional mobility with recommendation for home physical therapy.  This will be  arranged.  Patient has been seen by [name] today and deemed ready for discharge home. All follow up appointments have been scheduled. Discharge medications are listed below.   Did the patient have an acute coronary syndrome (MI, NSTEMI, STEMI, etc) this admission?:  No                               Did the patient have a percutaneous coronary intervention (stent / angioplasty)?:  No.   _____________  Discharge Vitals Blood pressure 96/68, pulse 71, temperature 97.7 F (36.5 C), resp. rate 20, height 5\' 3"  (1.6 m), weight 55.5 kg, SpO2 98 %.  Filed Weights   05/31/19 1603 06/01/19 0500  Weight: 58.8 kg 55.5 kg    Labs & Radiologic Studies    CBC Recent Labs    05/30/19 1037  WBC 9.0  HGB 11.6*  HCT 37.9  MCV 95.2  PLT 973   Basic Metabolic Panel Recent Labs    05/31/19 0500 06/01/19 0501  NA 142 141  K 4.2 4.1  CL 99 101  CO2 31 29  GLUCOSE 92 111*  BUN 23 35*  CREATININE 1.13* 1.23*  CALCIUM 9.6 9.3  MG 2.3  --    Liver Function Tests No results for input(s): AST, ALT, ALKPHOS, BILITOT, PROT, ALBUMIN in the last 72 hours. No results for input(s): LIPASE, AMYLASE in the last 72 hours. High Sensitivity Troponin:   No results for input(s): TROPONINIHS in the last 720 hours.  BNP Invalid input(s): POCBNP D-Dimer No results for input(s): DDIMER in the last 72 hours. Hemoglobin A1C No results for input(s): HGBA1C in the last 72 hours. Fasting Lipid Panel No results for input(s): CHOL, HDL, LDLCALC, TRIG, CHOLHDL, LDLDIRECT in the last 72 hours. Thyroid Function Tests No results for input(s): TSH, T4TOTAL, T3FREE, THYROIDAB in the last 72 hours.  Invalid input(s): FREET3 _____________  DG Chest 2 View  Result Date: 05/30/2019 CLINICAL DATA:  Shortness of breath. Additional history provided: Bilateral lower extremity edema for 3-4 days, history of CHF. EXAM: CHEST - 2 VIEW COMPARISON:  Prior chest radiographs 05/28/2019 and earlier FINDINGS: Cardiomegaly,  unchanged. Aortic atherosclerosis. Small left greater than right pleural effusions with left basilar atelectasis, not significantly changed since chest radiograph 05/28/2019. Chronic interstitial prominence with biapical pleuroparenchymal scarring. No evidence of pneumothorax. Partially visualized left shoulder prosthesis. No acute bony abnormality is identified. IMPRESSION: No significant interval change as compared to chest radiograph 05/28/2019. Small left greater than right pleural effusions with left basilar atelectasis. Pneumonia at the left Evans base cannot be excluded. Unchanged cardiomegaly. Aortic atherosclerosis. Electronically Signed   By: Kellie Simmering DO   On: 05/30/2019 11:10   DG Chest Port 1 View  Result Date: 05/28/2019 CLINICAL DATA:  Bilateral lower extremity swelling. EXAM: PORTABLE CHEST 1 VIEW COMPARISON:  June 26, 2018. FINDINGS: Stable cardiomegaly. No pneumothorax is noted. Right Evans is clear. Small left pleural effusion is noted with probable associated atelectasis or scarring. Bony thorax is unremarkable. IMPRESSION: Small left pleural effusion with probable associated atelectasis or scarring. Stable cardiomegaly. No pneumothorax is noted. Electronically Signed  By: Marijo Conception M.D.   On: 05/28/2019 13:52   Disposition   Pt is being discharged home today in good condition.  Follow-up Plans & Appointments    Follow-up Information    Almyra Deforest, [place] Follow up.   Specialties: Cardiology, Radiology Why: Cardiology hospital follow up on 06/14/2019 at 9:30. Please arrive 15 minutes early for check-in. -The appointment for 06/03/2019 has been canceled. Contact information: [address] [place] [address] [phone]          Discharge Instructions    (HEART FAILURE PATIENTS) Call MD:  Anytime you have any of the following symptoms: 1) 3 pound weight gain in 24 hours or 5 pounds in 1 week 2) shortness of breath, with or without a dry hacking cough  3) swelling in the hands, feet or stomach 4) if you have to sleep on extra pillows at night in order to breathe.   Complete by: As directed    Diet - low sodium heart healthy   Complete by: As directed    Discharge instructions   Complete by: As directed    Take Bumex 1 mg as needed (once daily) for weight gain of 2 pounds in 1 day or 5 pounds in 1 week or for increased swelling, shortness of breath or difficulty breathing when laying down.  Follow a low salt diet, less than 2000 mg in a day.   Increase activity slowly   Complete by: As directed       Discharge Medications   Allergies as of 06/01/2019      Reactions   [name] [ethacrynic Acid] Nausea And Vomiting   Furosemide Rash   Tolerates Bumex   Sulfa Antibiotics Itching, Rash   Tape Rash, Other (See Comments)   Tears skin off.  Please use "paper" tape only.   [name] Rash   Per Dr Sallyanne Evans, had a skin reaction with thiazides prior to use of Lasix   Torsemide Rash   Tolerates Bumex      Medication List    TAKE these medications   acetaminophen 650 MG CR tablet Commonly known as: TYLENOL Take 650 mg by mouth 3 (three) times daily. Morning, supper and bedtime   Acetaminophen PM 25-500 MG Tabs tablet Generic drug: diphenhydramine-acetaminophen Take 1 tablet by mouth at bedtime as needed.   atorvastatin 40 MG tablet Commonly known as: LIPITOR TAKE 1 TABLET DAILY AT 6 P.M. What changed: See the new instructions.   augmented betamethasone dipropionate 0.05 % cream Commonly known as: DIPROLENE-AF Apply 1 application topically daily.   benzonatate 100 MG capsule Commonly known as: TESSALON Take 1 capsule (100 mg total) by mouth 2 (two) times daily as needed for cough.   bumetanide 1 MG tablet Commonly known as: BUMEX Take 1 tablet (1 mg total) by mouth daily as needed (for wt gain of 2 pounds in a day or 5 pounds in a week, swelling or shortness of breath).   ezetimibe 10 MG tablet Commonly known  as: ZETIA Take 1 tablet (10 mg total) by mouth daily.   folic acid 1 MG tablet Commonly known as: FOLVITE TAKE 2 TABLETS DAILY   ipratropium 0.03 % nasal spray Commonly known as: ATROVENT Place 2 sprays into both nostrils 3 (three) times daily as needed for rhinitis.   levothyroxine 88 MCG tablet Commonly known as: SYNTHROID TAKE 1 TABLET DAILY BEFORE BREAKFAST   loratadine 10 MG tablet Commonly known as: CLARITIN Take 10 mg by mouth daily.  magnesium hydroxide 400 MG/5ML suspension Commonly known as: MILK OF MAGNESIA Take 15 mLs by mouth daily as needed for mild constipation.   metoprolol tartrate 25 MG tablet Commonly known as: LOPRESSOR TAKE 1 TABLET TWICE A DAY   multivitamin-iron-minerals-folic acid chewable tablet Chew 1 tablet by mouth daily.   OVER THE COUNTER MEDICATION Take 1 tablet by mouth See admin instructions. Stool softener over the counter by mouth daily  Fiber   OVER THE COUNTER MEDICATION Take 1 tablet by mouth See admin instructions. Over the counter pill for osteoarthritis by mouth three time daily   spironolactone 25 MG tablet Commonly known as: ALDACTONE Take 1 tablet (25 mg total) by mouth daily. What changed:   how much to take  when to take this  additional instructions   vitamin C 500 MG tablet Commonly known as: ASCORBIC ACID Take 500 mg by mouth daily.   Vitamin D 50 MCG (2000 UT) tablet Take 2,000 Units by mouth daily.   warfarin 2.5 MG tablet Commonly known as: COUMADIN Take as directed. If you are unsure how to take this medication, talk to your nurse or doctor. Original instructions: TAKE 1/2 to 1 TABLET DAILY  AS DIRECTED BY COUMADIN CLINIC What changed:   when to take this  additional instructions          Outstanding Labs/Studies   BMet at follow up   Duration of Discharge Encounter   Greater than 30 minutes including physician time.  Signed, Daune Perch, NP 06/01/2019, 2:06 PM

## 2019-06-01 NOTE — Progress Notes (Addendum)
   05/31/19 2200  Vitals  BP (!) 82/52  MAP (mmHg) (!) 62  Pulse Rate 73  ECG Heart Rate 77  Resp 20  Oxygen Therapy  SpO2 94 %  MEWS Score  MEWS RR 0  MEWS Pulse 0  MEWS Systolic 1  MEWS LOC 0  MEWS Temp 0  MEWS Score 1  MEWS Score Color Green   Provider paged regarding BP. Pt was asleep and asymptomatic. Pt due to receive metoprolol and melatonin. Provider stated to hold metoprolol for the night and hold melatonin until BP improves. Pt now awake and BP improved. Went to sleep without sleep aid. Provider ok with BP in the 90s. Page again if BP drops and does not recover with waking of patient.

## 2019-06-01 NOTE — Progress Notes (Signed)
ANTICOAGULATION CONSULT NOTE - Follow Up Consult  Pharmacy Consult for Coumadin Indication: atrial fibrillation  Allergies  Allergen Reactions  . [name] [Ethacrynic Acid] Nausea And Vomiting  . Furosemide Rash    Tolerates Bumex  . Sulfa Antibiotics Itching and Rash  . Tape Rash and Other (See Comments)    Tears skin off.  Please use "paper" tape only.   . [name] Rash    Per Dr Sallyanne Kuster, had a skin reaction with thiazides prior to use of Lasix  . Torsemide Rash    Tolerates Bumex    Patient Measurements: Height: 5\' 3"  (160 cm) Weight: 122 lb 6.4 oz (55.5 kg) IBW/kg (Calculated) : 52.4  Vital Signs: Temp: 97.7 F (36.5 C) (01/05 1101) Temp Source: Oral (01/05 0754) BP: 94/61 (01/05 1101) Pulse Rate: 71 (01/05 0813)  Labs: Recent Labs    05/30/19 1037 05/31/19 0500 06/01/19 0501  HGB 11.6*  --   --   HCT 37.9  --   --   PLT 257  --   --   LABPROT 23.2* 24.9* 26.3*  INR 2.1* 2.3* 2.4*  CREATININE 1.09* 1.13* 1.23*    Estimated Creatinine Clearance: 23.6 mL/min (A) (by C-G formula based on SCr of 1.23 mg/dL (H)).  Assessment: Anticoag: INR 2.4.  Warfarin PTA 1.25mg  MWF and 2.5mg  ROW   Goal of Therapy:  INR 2-3 Monitor platelets by anticoagulation protocol: Yes   Plan:  Resume home Coumadin regimen Daily INR while inpatient.   Shaquina Gillham S. Alford Highland, PharmD, BCPS Clinical Staff Pharmacist [email or web address] Alford Highland, [place] 06/01/2019,11:07 AM

## 2019-06-01 NOTE — TOC Progression Note (Addendum)
Transition of Care [facility]) - Progression Note    Patient Details  Name: [name] MRN: [id number] Date of Birth: Aug 31, 1923  Transition of Care [facility]) CM/SW Contact  Zenon Mayo, RN Phone Number: 06/01/2019, 3:22 PM  Clinical Narrative:    NCM spoke with patient, offered choice for HHRN, HHPT, she states she really does not have a preference.  NCM made referral to [place] with [facility],  awaiting call back.  Patients home phone is [phone].         Expected Discharge Plan and Services           Expected Discharge Date: 06/01/19                                     Social Determinants of Health (SDOH) Interventions    Readmission Risk Interventions No flowsheet data found.

## 2019-06-01 NOTE — Discharge Instructions (Signed)

## 2019-06-01 NOTE — Progress Notes (Signed)
Progress Note  Patient Name: [name] Date of Encounter: 06/01/2019  Primary Cardiologist: Sheryl Klein, MD   Subjective   Feeling better.  Her breathing is back to baseline.  Last night she was hypotensive so metoprolol was held.  This morning her heart rate is in the one thirties.  She denies palpitations, lightheadedness, or dizziness.  Inpatient Medications    Scheduled Meds: . atorvastatin  40 mg Oral q1800  . bumetanide  1 mg Oral Daily  . ezetimibe  10 mg Oral Daily  . folic acid  2 mg Oral Daily  . levothyroxine  88 mcg Oral Q0600  . loratadine  10 mg Oral Daily  . metoprolol tartrate  25 mg Oral BID  . sodium chloride flush  3 mL Intravenous Once  . sodium chloride flush  3 mL Intravenous Q12H  . spironolactone  25 mg Oral Daily  . triamcinolone cream   Topical BID  . Warfarin - Pharmacist Dosing Inpatient   Does not apply q1800   Continuous Infusions: . sodium chloride     PRN Meds: sodium chloride, acetaminophen, ondansetron (ZOFRAN) IV, sodium chloride flush   Vital Signs    Vitals:   06/01/19 0754 06/01/19 0800 06/01/19 0811 06/01/19 0813  BP: (!) 124/105 (!) 86/66 105/74   Pulse: (!) 121  (!) 107 71  Resp: 17  20 20   Temp: (!) 97.5 F (36.4 C)     TempSrc: Oral     SpO2: 96%  96% 98%  Weight:      Height:        Intake/Output Summary (Last 24 hours) at 06/01/2019 0857 Last data filed at 06/01/2019 0805 Gross per 24 hour  Intake 480 ml  Output 650 ml  Net -170 ml   Last 3 Weights 06/01/2019 05/31/2019 05/28/2019  Weight (lbs) 122 lb 6.4 oz 129 lb 10.1 oz 126 lb  Weight (kg) 55.52 kg 58.8 kg 57.153 kg      Telemetry    Atrial flutter.  Rate seventies to one forties- Personally Reviewed  ECG    n/a - Personally Reviewed  Physical Exam   VS:  BP 105/74 (BP Location: Right Arm)   Pulse 71   Temp (!) 97.5 F (36.4 C) (Oral)   Resp 20   Ht 5\' 3"  (1.6 m)   Wt 55.5 kg   SpO2 98%   BMI 21.68 kg/m  , BMI Body mass index is 21.68  kg/m. GENERAL:  Frail, elderly woman in no acute distress.  Well-appearing HEENT: Pupils equal round and reactive, fundi not visualized, oral mucosa unremarkable NECK:  No jugular venous distention, waveform within normal limits, carotid upstroke brisk and symmetric, no bruits LUNGS:  Clear to auscultation bilaterally HEART: Tachycardic.  Irregularly irregular.  PMI not displaced or sustained,S1 and S2 within normal limits, no S3, no S4, no clicks, no rubs, II/VI systolic murmur at the LUSB ABD:  Flat, positive bowel sounds normal in frequency in pitch, no bruits, no rebound, no guarding, no midline pulsatile mass, no hepatomegaly, no splenomegaly EXT:  2 plus pulses throughout, no edema, no cyanosis no clubbing SKIN:  No rashes no nodules NEURO:  Cranial nerves II through XII grossly intact, motor grossly intact throughout PSYCH:  Cognitively intact, oriented to person place and time   Labs    High Sensitivity Troponin:  No results for input(s): TROPONINIHS in the last 720 hours.    Chemistry Recent Labs  Lab 05/28/19 1345 05/30/19 1037 05/31/19 0500 06/01/19  0501  NA 142 141 142 141  K 4.4 4.2 4.2 4.1  CL 108 106 99 101  CO2 25 26 31 29   GLUCOSE 100* 101* 92 111*  BUN 18 19 23  35*  CREATININE 0.99 1.09* 1.13* 1.23*  CALCIUM 9.2 9.2 9.6 9.3  PROT 6.8  --   --   --   ALBUMIN 3.8  --   --   --   AST 21  --   --   --   ALT 17  --   --   --   ALKPHOS 77  --   --   --   BILITOT 1.7*  --   --   --   GFRNONAA 49* 44* 42* 38*  GFRAA 57* 51* 49* 44*  ANIONGAP 9 9 12 11      Hematology Recent Labs  Lab 05/28/19 1345 05/30/19 1037  WBC 8.9 9.0  RBC 3.82* 3.98  HGB 11.3* 11.6*  HCT 36.5 37.9  MCV 95.5 95.2  MCH 29.6 29.1  MCHC 31.0 30.6  RDW 13.7 13.6  PLT 241 257    BNP Recent Labs  Lab 05/28/19 1345 05/30/19 1037  BNP 387.5* 345.4*     DDimer No results for input(s): DDIMER in the last 168 hours.   Radiology    DG Chest 2 View  Result Date:  05/30/2019 CLINICAL DATA:  Shortness of breath. Additional history provided: Bilateral lower extremity edema for 3-4 days, history of CHF. EXAM: CHEST - 2 VIEW COMPARISON:  Prior chest radiographs 05/28/2019 and earlier FINDINGS: Cardiomegaly, unchanged. Aortic atherosclerosis. Small left greater than right pleural effusions with left basilar atelectasis, not significantly changed since chest radiograph 05/28/2019. Chronic interstitial prominence with biapical pleuroparenchymal scarring. No evidence of pneumothorax. Partially visualized left shoulder prosthesis. No acute bony abnormality is identified. IMPRESSION: No significant interval change as compared to chest radiograph 05/28/2019. Small left greater than right pleural effusions with left basilar atelectasis. Pneumonia at the left lung base cannot be excluded. Unchanged cardiomegaly. Aortic atherosclerosis. Electronically Signed   By: Kellie Simmering DO   On: 05/30/2019 11:10    Cardiac Studies   Echo 04/2018: Study Conclusions  - Left ventricle: The cavity size was normal. Wall thickness was   increased in a pattern of mild LVH. Systolic function was normal.   The estimated ejection fraction was in the range of 55% to 60%.   Wall motion was normal; there were no regional wall motion   abnormalities. The study is not technically sufficient to allow   evaluation of LV diastolic function. Doppler parameters are   consistent with high ventricular filling pressure. - Aortic valve: Valve mobility was restricted. There was mild to   moderate stenosis. There was trivial regurgitation. - Mitral valve: Severely calcified annulus. Mildly thickened   leaflets . - Left atrium: The atrium was moderately dilated. - Pulmonary arteries: Systolic pressure was mildly increased. PA   peak pressure: 46 mm Hg (S). - Pericardium, extracardiac: A small pericardial effusion was   identified. There was a left pleural effusion.  Patient Profile     [name]  is a 44F with moderate aortic stenosis, CAD s/p PCI, moderate pulmonary hypertension, hypertension, hyperlipidemia, hypothyroidism, and low-grade lymphoma admitted with acute on chronic diastolic heart failure.  Assessment & Plan    # Acute on chronic diastolic heart failure: Volume status is much better.  She no longer has crackles on exam and appears to be euvolemic.  Fortunately she seems to have tolerated  Bumex despite rashes with torsemide and furosemide.  Her creatinine is starting to increase.  Therefore we will stop bumetanide today.  She will be discharged on her home dose of spironolactone 25 mg daily.  She can take Bumex 1 mg daily as needed for weight gain of 2 pounds in 1 day or 5 pounds in 1 week.  She can also take it if she has increased lower extremity edema, orthopnea, or PND.  # Mild-moderate AS: Mean gradient 18 on 04/2018.  It doesn't sound severe on exam.  Diuresis as above.   # Essential hypertension: BP well-controlled on metoprolol and spironolactone.  # Hyperlipidemia: Continue atorvastatin and Zetia.  # Hypothyroidism: Continue levothyroxine.   # Atrial flutter: Her heart rate has been generally well-controlled.  However her evening metoprolol was held last night due to hypotension.  She just received her metoprolol this morning.  If her heart rate comes back down below 100 bpm she can be discharged later today.    For questions or updates, please contact [place] Please consult [email or web address] for contact info under        Signed, Skeet Latch, MD  06/01/2019, 8:57 AM

## 2019-06-03 ENCOUNTER — Ambulatory Visit: Payer: Medicare Other | Admitting: Physician Assistant

## 2019-06-03 ENCOUNTER — Other Ambulatory Visit: Payer: Self-pay

## 2019-06-03 ENCOUNTER — Encounter: Payer: Self-pay | Admitting: Family Medicine

## 2019-06-03 ENCOUNTER — Telehealth: Payer: Self-pay

## 2019-06-03 ENCOUNTER — Ambulatory Visit (INDEPENDENT_AMBULATORY_CARE_PROVIDER_SITE_OTHER): Payer: Medicare Other | Admitting: Family Medicine

## 2019-06-03 VITALS — BP 110/74 | HR 86 | Temp 97.1°F | Wt 124.8 lb

## 2019-06-03 DIAGNOSIS — I5033 Acute on chronic diastolic (congestive) heart failure: Secondary | ICD-10-CM | POA: Diagnosis not present

## 2019-06-03 DIAGNOSIS — I480 Paroxysmal atrial fibrillation: Secondary | ICD-10-CM | POA: Diagnosis not present

## 2019-06-03 DIAGNOSIS — I350 Nonrheumatic aortic (valve) stenosis: Secondary | ICD-10-CM

## 2019-06-03 NOTE — Telephone Encounter (Signed)
Margret called to request a vwerbal for skilled nursing and PT . Please advise [facility]

## 2019-06-03 NOTE — Telephone Encounter (Signed)
ok 

## 2019-06-03 NOTE — Progress Notes (Signed)
   Subjective:    Patient ID: [name], female    DOB: [birth year], 86 y.o.   MRN: [id number]  HPI She is here for follow-up hospital visit for treatment of acute on chronic CHF as well as underlying atrial stenosis and AF.  She was admitted because of shortness of breath.  Hospital record including lab work, x-ray and discharge summary was reviewed.  Presently she is having no chest pain, shortness of breath, PND.  She is wearing support hose and does have some slight swelling in her legs but no other complaints.   Review of Systems     Objective:   Physical Exam Alert and in no distress.  Cardiac exam does show an irregular rhythm.  Lungs are clear to auscultation.  Lower extremity  exam shows 1+ edema.       Assessment & Plan:  Acute on chronic diastolic CHF (congestive heart failure) (HCC) - Plan: Comprehensive metabolic panel, CBC with Differential  PAF (paroxysmal atrial fibrillation) (HCC)  Nonrheumatic aortic valve stenosis She is to follow-up with cardiology.  Also gave information concerning getting the Covid vaccine.  Follow-up here as needed.

## 2019-06-04 DIAGNOSIS — E785 Hyperlipidemia, unspecified: Secondary | ICD-10-CM | POA: Diagnosis not present

## 2019-06-04 DIAGNOSIS — I272 Pulmonary hypertension, unspecified: Secondary | ICD-10-CM | POA: Diagnosis not present

## 2019-06-04 DIAGNOSIS — I5033 Acute on chronic diastolic (congestive) heart failure: Secondary | ICD-10-CM | POA: Diagnosis not present

## 2019-06-04 DIAGNOSIS — I4821 Permanent atrial fibrillation: Secondary | ICD-10-CM | POA: Diagnosis not present

## 2019-06-04 DIAGNOSIS — Z955 Presence of coronary angioplasty implant and graft: Secondary | ICD-10-CM | POA: Diagnosis not present

## 2019-06-04 DIAGNOSIS — E039 Hypothyroidism, unspecified: Secondary | ICD-10-CM | POA: Diagnosis not present

## 2019-06-04 DIAGNOSIS — Z8572 Personal history of non-Hodgkin lymphomas: Secondary | ICD-10-CM | POA: Diagnosis not present

## 2019-06-04 DIAGNOSIS — I2510 Atherosclerotic heart disease of native coronary artery without angina pectoris: Secondary | ICD-10-CM | POA: Diagnosis not present

## 2019-06-04 DIAGNOSIS — R21 Rash and other nonspecific skin eruption: Secondary | ICD-10-CM | POA: Diagnosis not present

## 2019-06-04 DIAGNOSIS — I350 Nonrheumatic aortic (valve) stenosis: Secondary | ICD-10-CM | POA: Diagnosis not present

## 2019-06-04 DIAGNOSIS — H3530 Unspecified macular degeneration: Secondary | ICD-10-CM | POA: Diagnosis not present

## 2019-06-04 DIAGNOSIS — I110 Hypertensive heart disease with heart failure: Secondary | ICD-10-CM | POA: Diagnosis not present

## 2019-06-04 DIAGNOSIS — I4892 Unspecified atrial flutter: Secondary | ICD-10-CM | POA: Diagnosis not present

## 2019-06-04 DIAGNOSIS — Z7901 Long term (current) use of anticoagulants: Secondary | ICD-10-CM | POA: Diagnosis not present

## 2019-06-04 LAB — COMPREHENSIVE METABOLIC PANEL
ALT: 15 IU/L (ref 0–32)
AST: 19 IU/L (ref 0–40)
Albumin/Globulin Ratio: 1.6 (ref 1.2–2.2)
Albumin: 4.3 g/dL (ref 3.5–4.6)
Alkaline Phosphatase: 96 IU/L (ref 39–117)
BUN/Creatinine Ratio: 26 (ref 12–28)
BUN: 47 mg/dL — ABNORMAL HIGH (ref 10–36)
Bilirubin Total: 1.2 mg/dL (ref 0.0–1.2)
CO2: 23 mmol/L (ref 20–29)
Calcium: 9.9 mg/dL (ref 8.7–10.3)
Chloride: 101 mmol/L (ref 96–106)
Creatinine, Ser: 1.78 mg/dL — ABNORMAL HIGH (ref 0.57–1.00)
GFR calc Af Amer: 28 mL/min/{1.73_m2} — ABNORMAL LOW (ref >59)
GFR calc non Af Amer: 24 mL/min/{1.73_m2} — ABNORMAL LOW (ref >59)
Globulin, Total: 2.7 g/dL (ref 1.5–4.5)
Glucose: 85 mg/dL (ref 65–99)
Potassium: 4.9 mmol/L (ref 3.5–5.2)
Sodium: 141 mmol/L (ref 134–144)
Total Protein: 7 g/dL (ref 6.0–8.5)

## 2019-06-04 LAB — CBC WITH DIFFERENTIAL/PLATELET
Basophils Absolute: 0.1 10*3/uL (ref 0.0–0.2)
Basos: 1 %
EOS (ABSOLUTE): 0.1 10*3/uL (ref 0.0–0.4)
Eos: 1 %
Hematocrit: 37.8 % (ref 34.0–46.6)
Hemoglobin: 12.3 g/dL (ref 11.1–15.9)
Immature Grans (Abs): 0 10*3/uL (ref 0.0–0.1)
Immature Granulocytes: 0 %
Lymphocytes Absolute: 0.9 10*3/uL (ref 0.7–3.1)
Lymphs: 12 %
MCH: 29.3 pg (ref 26.6–33.0)
MCHC: 32.5 g/dL (ref 31.5–35.7)
MCV: 90 fL (ref 79–97)
Monocytes Absolute: 0.7 10*3/uL (ref 0.1–0.9)
Monocytes: 10 %
Neutrophils Absolute: 5.2 10*3/uL (ref 1.4–7.0)
Neutrophils: 76 %
Platelets: 311 10*3/uL (ref 150–450)
RBC: 4.2 x10E6/uL (ref 3.77–5.28)
RDW: 12.7 % (ref 11.7–15.4)
WBC: 6.9 10*3/uL (ref 3.4–10.8)

## 2019-06-04 NOTE — Telephone Encounter (Signed)
Kidred was advised [facility]

## 2019-06-07 ENCOUNTER — Telehealth: Payer: Self-pay | Admitting: Family Medicine

## 2019-06-07 ENCOUNTER — Telehealth: Payer: Self-pay

## 2019-06-07 NOTE — Telephone Encounter (Signed)
ERROR

## 2019-06-07 NOTE — Telephone Encounter (Signed)
[facility] called. She is requesting PT for this pt for the following:  1 a week for 1 week 2 a week for 4 weeks 1 a week for 4 weeks  She can be reached at [phone].

## 2019-06-07 NOTE — Telephone Encounter (Signed)
ok 

## 2019-06-07 NOTE — Telephone Encounter (Signed)
[name] was advised [facility].

## 2019-06-08 DIAGNOSIS — E785 Hyperlipidemia, unspecified: Secondary | ICD-10-CM | POA: Diagnosis not present

## 2019-06-08 DIAGNOSIS — H3530 Unspecified macular degeneration: Secondary | ICD-10-CM | POA: Diagnosis not present

## 2019-06-08 DIAGNOSIS — I350 Nonrheumatic aortic (valve) stenosis: Secondary | ICD-10-CM | POA: Diagnosis not present

## 2019-06-08 DIAGNOSIS — I4892 Unspecified atrial flutter: Secondary | ICD-10-CM | POA: Diagnosis not present

## 2019-06-08 DIAGNOSIS — I5033 Acute on chronic diastolic (congestive) heart failure: Secondary | ICD-10-CM | POA: Diagnosis not present

## 2019-06-08 DIAGNOSIS — Z7901 Long term (current) use of anticoagulants: Secondary | ICD-10-CM | POA: Diagnosis not present

## 2019-06-08 DIAGNOSIS — I110 Hypertensive heart disease with heart failure: Secondary | ICD-10-CM | POA: Diagnosis not present

## 2019-06-08 DIAGNOSIS — I4821 Permanent atrial fibrillation: Secondary | ICD-10-CM | POA: Diagnosis not present

## 2019-06-08 DIAGNOSIS — I272 Pulmonary hypertension, unspecified: Secondary | ICD-10-CM | POA: Diagnosis not present

## 2019-06-08 DIAGNOSIS — E039 Hypothyroidism, unspecified: Secondary | ICD-10-CM | POA: Diagnosis not present

## 2019-06-08 DIAGNOSIS — R21 Rash and other nonspecific skin eruption: Secondary | ICD-10-CM | POA: Diagnosis not present

## 2019-06-08 DIAGNOSIS — I2510 Atherosclerotic heart disease of native coronary artery without angina pectoris: Secondary | ICD-10-CM | POA: Diagnosis not present

## 2019-06-08 DIAGNOSIS — Z8572 Personal history of non-Hodgkin lymphomas: Secondary | ICD-10-CM | POA: Diagnosis not present

## 2019-06-08 DIAGNOSIS — Z955 Presence of coronary angioplasty implant and graft: Secondary | ICD-10-CM | POA: Diagnosis not present

## 2019-06-10 DIAGNOSIS — R21 Rash and other nonspecific skin eruption: Secondary | ICD-10-CM | POA: Diagnosis not present

## 2019-06-10 DIAGNOSIS — I350 Nonrheumatic aortic (valve) stenosis: Secondary | ICD-10-CM | POA: Diagnosis not present

## 2019-06-10 DIAGNOSIS — H3530 Unspecified macular degeneration: Secondary | ICD-10-CM | POA: Diagnosis not present

## 2019-06-10 DIAGNOSIS — I2510 Atherosclerotic heart disease of native coronary artery without angina pectoris: Secondary | ICD-10-CM | POA: Diagnosis not present

## 2019-06-10 DIAGNOSIS — I5033 Acute on chronic diastolic (congestive) heart failure: Secondary | ICD-10-CM | POA: Diagnosis not present

## 2019-06-10 DIAGNOSIS — I272 Pulmonary hypertension, unspecified: Secondary | ICD-10-CM | POA: Diagnosis not present

## 2019-06-10 DIAGNOSIS — I110 Hypertensive heart disease with heart failure: Secondary | ICD-10-CM | POA: Diagnosis not present

## 2019-06-10 DIAGNOSIS — I4821 Permanent atrial fibrillation: Secondary | ICD-10-CM | POA: Diagnosis not present

## 2019-06-10 DIAGNOSIS — E785 Hyperlipidemia, unspecified: Secondary | ICD-10-CM | POA: Diagnosis not present

## 2019-06-10 DIAGNOSIS — E039 Hypothyroidism, unspecified: Secondary | ICD-10-CM | POA: Diagnosis not present

## 2019-06-10 DIAGNOSIS — Z7901 Long term (current) use of anticoagulants: Secondary | ICD-10-CM | POA: Diagnosis not present

## 2019-06-10 DIAGNOSIS — Z955 Presence of coronary angioplasty implant and graft: Secondary | ICD-10-CM | POA: Diagnosis not present

## 2019-06-10 DIAGNOSIS — Z8572 Personal history of non-Hodgkin lymphomas: Secondary | ICD-10-CM | POA: Diagnosis not present

## 2019-06-10 DIAGNOSIS — I4892 Unspecified atrial flutter: Secondary | ICD-10-CM | POA: Diagnosis not present

## 2019-06-14 ENCOUNTER — Ambulatory Visit (INDEPENDENT_AMBULATORY_CARE_PROVIDER_SITE_OTHER): Payer: Medicare Other | Admitting: Pharmacist

## 2019-06-14 ENCOUNTER — Encounter: Payer: Self-pay | Admitting: Physician Assistant

## 2019-06-14 ENCOUNTER — Other Ambulatory Visit: Payer: Self-pay

## 2019-06-14 ENCOUNTER — Ambulatory Visit (INDEPENDENT_AMBULATORY_CARE_PROVIDER_SITE_OTHER): Payer: Medicare Other | Admitting: Family Medicine

## 2019-06-14 VITALS — BP 118/62 | HR 81 | Temp 95.9°F | Ht 63.0 in | Wt 122.2 lb

## 2019-06-14 DIAGNOSIS — N179 Acute kidney failure, unspecified: Secondary | ICD-10-CM

## 2019-06-14 DIAGNOSIS — I350 Nonrheumatic aortic (valve) stenosis: Secondary | ICD-10-CM

## 2019-06-14 DIAGNOSIS — Z7901 Long term (current) use of anticoagulants: Secondary | ICD-10-CM | POA: Diagnosis not present

## 2019-06-14 DIAGNOSIS — I5032 Chronic diastolic (congestive) heart failure: Secondary | ICD-10-CM

## 2019-06-14 DIAGNOSIS — I10 Essential (primary) hypertension: Secondary | ICD-10-CM

## 2019-06-14 DIAGNOSIS — I4821 Permanent atrial fibrillation: Secondary | ICD-10-CM | POA: Insufficient documentation

## 2019-06-14 DIAGNOSIS — I480 Paroxysmal atrial fibrillation: Secondary | ICD-10-CM | POA: Diagnosis not present

## 2019-06-14 DIAGNOSIS — I4892 Unspecified atrial flutter: Secondary | ICD-10-CM | POA: Diagnosis not present

## 2019-06-14 LAB — POCT INR: INR: 3 (ref 2.0–3.0)

## 2019-06-14 LAB — BASIC METABOLIC PANEL
BUN/Creatinine Ratio: 22 (ref 12–28)
BUN: 32 mg/dL (ref 10–36)
CO2: 20 mmol/L (ref 20–29)
Calcium: 10 mg/dL (ref 8.7–10.3)
Chloride: 107 mmol/L — ABNORMAL HIGH (ref 96–106)
Creatinine, Ser: 1.44 mg/dL — ABNORMAL HIGH (ref 0.57–1.00)
GFR calc Af Amer: 36 mL/min/{1.73_m2} — ABNORMAL LOW (ref >59)
GFR calc non Af Amer: 31 mL/min/{1.73_m2} — ABNORMAL LOW (ref >59)
Glucose: 77 mg/dL (ref 65–99)
Potassium: 4.7 mmol/L (ref 3.5–5.2)
Sodium: 141 mmol/L (ref 134–144)

## 2019-06-14 NOTE — Progress Notes (Addendum)
Cardiology Office Note  Date: 06/14/2019   ID: DERIANA Evans, DOB 1923-08-14, MRN [id number]  PCP:  [name], MD  Cardiologist:  Sanda Klein, MD Electrophysiologist:  None   No chief complaint on file.   History of Present Illness: [name] is a 86 y.o. female with history of chronic diastolic CHF, CAD status post bare-metal stent to RCA in 2010.  Moderate aortic stenosis, PAF, hypertension, hyperlipidemia, hypothyroidism and lymphoma.  Recent admission January 3 for volume overload.  She has been noticing weight gain and lower extremity edema orthopnea.  Spironolactone was ordered to be increased to 25 mg.  Her symptoms worsened and she was admitted with shortness of breath to [facility] emergency department.   She last saw cardiology in August.  There was suspicion of possible transthyretin amyloidosis.  Renal function had decreased and spironolactone was decreased in response.  Last echo in December 2019 showed an ejection fraction of 55%.  Moderate left atrial enlargement, mild to moderate aortic stenosis and small pericardial effusion.  Diastolic function was unable to be evaluated.  Recent hospital admission for increased weight gain and lower extremity edema.  She was seen in [facility] ED May 27, 2018 with complaints of lower extremity edema, orthopnea and increased weight.  She was encouraged to increase her spironolactone to 25 mg.  She was started on Bumex She lost approximately 7 pounds during her stay and lost approximately 2 L in fluid weight , however creatinine increased from 0.99-1.23 after diuresis.  Bumex was prescribed 1 mg daily as needed for weight gain of 2 pounds in 1 day or 5 pounds in a week at discharge.  History of atrial flutter with rates controlled on metoprolol.  EKG today on arrival shows sinus rhythm with first-degree AV block, left bundle branch block rate of 81 bpm.  Patient denies any complaints of palpitations or  arrhythmias, syncope or near syncope, or dizziness.  States her weight is much better and swelling in both ankles has significantly decreased.  She denies any significant dyspnea on exertion.  Today's weight is 122 which appears to be her baseline.  States she may have eaten some food items during Christmas and New Year's holiday which may have contained salt and caused her increase in weight gain and lower extremity edema.    Past Medical History:  Diagnosis Date  .  Acute respiratiory failure requiring BiPap 06/24/2011  . Allergy   . Aortic valvular stenosis, moderate to severe 06/24/2011   a. 10/2012 Echo: mod AS, Valve 1.25 cm^2 (VTI), 1.19cm^2 (Vmax).  . Arthritis    "back" (10/30/2012)  . Chronic diastolic CHF (congestive heart failure) ([place])    a. 10/2012 Echo: EF 55-60%, no rwma, Gr 2 DD, moderate AS, mod dil LA, mildly to mod dil RA.  Marland Kitchen Coronary artery disease    a. 03/2009 PCI RCA (3.0x18 BMS).  . Dyslipidemia   . Exertional shortness of breath   . Hypertension   . Hypothyroidism   . PAF (paroxysmal atrial fibrillation) ([place])    a. CHA2DS2VASc = 7-->chronic coumadin;  b. s/p DCCV 05/2011, 05/2012, 07/2014;  c. On amio.  . Rash    a. felt to be 2/2 lasix/torsemide in setting of sulfa allergy (lasix Sheryl/c ~ 06/2014, torsemide Sheryl/c 08/09/2014).    Past Surgical History:  Procedure Laterality Date  . ANGIOPLASTY    . APPENDECTOMY    . BACK SURGERY    . CARDIAC CATHETERIZATION  10/06/2009  Continue medical therapy  . CARDIAC CATHETERIZATION  04/07/2009   RCA stented with a 3x64mm stent resulting in a reduction of 90% narrowing to normal  . CARDIOVERSION  06/19/2011   Procedure: CARDIOVERSION;  Surgeon: Sanda Klein, MD;  Location: [facility] OR;  Service: Cardiovascular;  Laterality: N/A;  . CARDIOVERSION  07/11/2010   Successful coversion to sinus rhythm  . CARDIOVERSION N/A 08/10/2014   Procedure: CARDIOVERSION;  Surgeon: Sanda Klein, MD;  Location: [facility];  Service:  Cardiovascular;  Laterality: N/A;  . CATARACT EXTRACTION W/ INTRAOCULAR LENS  IMPLANT, BILATERAL    . CORONARY ANGIOPLASTY WITH STENT PLACEMENT     "1" (10/30/2012)  . DILATION AND CURETTAGE OF UTERUS    . EXCISIONAL HEMORRHOIDECTOMY    . FRACTURE SURGERY    . HIP ARTHROPLASTY Right 04/17/2015   Procedure: HIP MONOPOLAR HIP HEMI ARTHROPLASTY;  Surgeon: Marybelle Killings, MD;  Location: [facility];  Service: Orthopedics;  Laterality: Right;  . IR THORACENTESIS ASP PLEURAL SPACE W/IMG GUIDE  04/28/2018  . IR THORACENTESIS ASP PLEURAL SPACE W/IMG GUIDE  05/26/2018  . LUMBAR DISC SURGERY  X 2   "cleaned out scar tissue"   . SHOULDER ACROMIOPLASTY Left    "fell; put a new ball in" (10/30/2012)  . WRIST FRACTURE SURGERY Left     Current Outpatient Medications  Medication Sig Dispense Refill  . acetaminophen (TYLENOL) 650 MG CR tablet Take 650 mg by mouth 3 (three) times daily. Morning, supper and bedtime    . atorvastatin (LIPITOR) 40 MG tablet TAKE 1 TABLET DAILY AT 6 P.M. (Patient taking differently: Take 40 mg by mouth daily at 6 PM. ) 90 tablet 3  . augmented betamethasone dipropionate (DIPROLENE-AF) 0.05 % cream Apply 1 application topically daily.     . bumetanide (BUMEX) 1 MG tablet Take 1 tablet (1 mg total) by mouth daily as needed (for wt gain of 2 pounds in a day or 5 pounds in a week, swelling or shortness of breath). 30 tablet 11  . Cholecalciferol (VITAMIN Sheryl) 50 MCG (2000 UT) tablet Take 2,000 Units by mouth daily.    . diphenhydramine-acetaminophen (ACETAMINOPHEN PM) 25-500 MG TABS tablet Take 1 tablet by mouth at bedtime as needed.    . ezetimibe (ZETIA) 10 MG tablet Take 1 tablet (10 mg total) by mouth daily. 90 tablet 1  . folic acid (FOLVITE) 1 MG tablet TAKE 2 TABLETS DAILY (Patient taking differently: Take 2 mg by mouth daily. ) 180 tablet 3  . ipratropium (ATROVENT) 0.03 % nasal spray Place 2 sprays into both nostrils 3 (three) times daily as needed for rhinitis. 30 mL 0  .  levothyroxine (SYNTHROID) 88 MCG tablet TAKE 1 TABLET DAILY BEFORE BREAKFAST (Patient taking differently: Take 88 mcg by mouth daily before breakfast. ) 90 tablet 1  . loratadine (CLARITIN) 10 MG tablet Take 10 mg by mouth daily.    . magnesium hydroxide (MILK OF MAGNESIA) 400 MG/5ML suspension Take 15 mLs by mouth daily as needed for mild constipation. 360 mL 0  . metoprolol tartrate (LOPRESSOR) 25 MG tablet TAKE 1 TABLET TWICE A DAY (Patient taking differently: Take 25 mg by mouth 2 (two) times daily. ) 180 tablet 2  . multivitamin-iron-minerals-folic acid (CENTRUM) chewable tablet Chew 1 tablet by mouth daily.    Marland Kitchen OVER THE COUNTER MEDICATION Take 1 tablet by mouth See admin instructions. Stool softener over the counter by mouth daily  Fiber    . OVER THE COUNTER MEDICATION Take 1 tablet by  mouth See admin instructions. Over the counter pill for osteoarthritis by mouth three time daily    . spironolactone (ALDACTONE) 25 MG tablet Take 1 tablet (25 mg total) by mouth daily. 90 tablet 3  . vitamin C (ASCORBIC ACID) 500 MG tablet Take 500 mg by mouth daily.    Marland Kitchen warfarin (COUMADIN) 2.5 MG tablet TAKE 1/2 to 1 TABLET DAILY  AS DIRECTED BY COUMADIN CLINIC (Patient taking differently: See admin instructions. TAKE 1/2 to 1 TABLET DAILY  AS DIRECTED BY COUMADIN CLINIC - 2.5mg  on Sunday, Tues, Thurs, Saturday and 1.25mg  on Monday, Wednesday, and Fridays.) 90 tablet 1   No current facility-administered medications for this visit.   Allergies:  [name] [ethacrynic acid], Furosemide, Sulfa antibiotics, Tape, [name], and Torsemide   Social History: The patient  reports that she has never smoked. She has never used smokeless tobacco. She reports that she does not drink alcohol or use drugs.   Family History: The patient's family history includes Alcohol abuse in her child; Cancer in her son; Cancer (age of onset: 72) in her mother; Heart attack in her father; Heart disease in her brother,  brother, and father; Heart disease (age of onset: 30) in her mother; Heart failure in her mother; Hypertension in her brother, father, mother, and sister; Hypertension (age of onset: 58) in her child; Lung disease in her brother; Stroke (age of onset: 108) in her mother.   ROS:  Please see the history of present illness. Otherwise, complete review of systems is positive for none.  All other systems are reviewed and negative.   Physical Exam: VS:  BP 118/62   Pulse 81   Temp (!) 95.9 F (35.5 C)   Ht 5\' 3"  (1.6 m)   Wt 122 lb 3.2 oz (55.4 kg)   BMI 21.65 kg/m , BMI Body mass index is 21.65 kg/m.  Wt Readings from Last 3 Encounters:  06/14/19 122 lb 3.2 oz (55.4 kg)  06/03/19 124 lb 12.8 oz (56.6 kg)  06/01/19 122 lb 6.4 oz (55.5 kg)    General: Patient appears comfortable at rest. Neck: Supple, no elevated JVP or carotid bruits, no thyromegaly. Lungs: Clear to auscultation, nonlabored breathing at rest. Cardiac: Regular rate and rhythm, no S3 or significant systolic murmur, no pericardial rub. Extremities: No pitting edema, distal pulses 2+. Skin: Warm and dry. Neuropsychiatric: Alert and oriented x3, affect grossly appropriate.  ECG:  An ECG dated May 31, 2019 was personally reviewed today and demonstrated:  Atrial flutter with a rate of 85, left bundle branch block  Recent Labwork: 05/30/2019: B Natriuretic Peptide 345.4 05/31/2019: Magnesium 2.3 06/03/2019: ALT 15; AST 19; BUN 47; Creatinine, Ser 1.78; Hemoglobin 12.3; Platelets 311; Potassium 4.9; Sodium 141     Component Value Date/Time   CHOL 120 01/20/2019 0956   TRIG 92 01/20/2019 0956   HDL 42 01/20/2019 0956   CHOLHDL 2.9 01/20/2019 0956   CHOLHDL 3.9 05/10/2016 0958   VLDL 28 05/10/2016 0958   LDLCALC 60 01/20/2019 0956    Other Studies Reviewed Today:  Echocardiogram April 28, 2018 Study Conclusions  - Left ventricle: The cavity size was normal. Wall thickness was   increased in a pattern of mild LVH.  Systolic function was normal.   The estimated ejection fraction was in the range of 55% to 60%.   Wall motion was normal; there were no regional wall motion   abnormalities. The study is not technically sufficient to allow   evaluation of LV diastolic  function. Doppler parameters are   consistent with high ventricular filling pressure. - Aortic valve: Valve mobility was restricted. There was mild to   moderate stenosis. There was trivial regurgitation. - Mitral valve: Severely calcified annulus. Mildly thickened   leaflets . - Left atrium: The atrium was moderately dilated. - Pulmonary arteries: Systolic pressure was mildly increased. PA   peak pressure: 46 mm Hg (S). - Pericardium, extracardiac: A small pericardial effusion was   identified. There was a left pleural effusion.  Impressions:  - Definity used; normal LV systolic function; mild LVH; calcified   aortic valve with mild to moderate AS (mean gradient 18 mmHg);   trace AI; moderate LAE; mild TR; mild pulmonary hypertension;   probable small pericardial effusion.   Assessment and Plan:  1. Chronic diastolic heart failure ([place])   2. Essential hypertension   3. Atrial flutter, unspecified type ([place])   4. Aortic valve stenosis, etiology of cardiac valve disease unspecified    1. Chronic diastolic heart failure (HCC) The study is not technically sufficient to allow evaluation of LV diastolic function. Doppler parameters are   consistent with high ventricular filling pressure.  Continue metoprolol 25 mg p.o. twice daily.  Continue Bumex 1 mg by mouth as daily for weight gain of 2 pounds in a day or 5 pounds in a week, swelling or shortness of breath.  Continue to weigh daily, watch sodium intake, limit fluid intake and report increased lower extremity edema, dyspnea, or increased weight gain not responsive to diuretic therapy.  2. Essential hypertension Blood pressure within normal limits today.  118/62.  Continue  spironolactone 25 mg daily.  3. Atrial flutter, unspecified type ([place]) No evidence today of atrial flutter on EKG. EKG today shows sinus rhythm with first-degree AV block rate of 81, left axis deviation, left bundle branch block.  Continue Coumadin 2.5 mg 1/2 to 1 tablet daily as directed by Coumadin clinic.  Get INR today  4. Aortic valve stenosis, etiology of cardiac valve disease unspecified Echocardiogram showed there is restrictive aortic valve mobility and mild to moderate stenosis with trivial regurgitation 2019  5. AKI (acute kidney injury) ([place]) Recent creatinine 1.78, GFR of 24, BUN of 47.  Increased from a previous creatinine of 1.23, GFR 38, BUN 35.  Get repeat BMP today to assess renal function status.   Medication Adjustments/Labs and Tests Ordered: Current medicines are reviewed at length with the patient today.  Concerns regarding medicines are outlined above.    There are no Patient Instructions on file for this visit.       Signed, Levell July, NP 06/14/2019 9:49 AM    [facility] [place], [place], [place] [address] Phone: [phone]; Fax: [phone]

## 2019-06-14 NOTE — Patient Instructions (Signed)
Medication Instructions:  Your physician recommends that you continue on your current medications as directed. Please refer to the Current Medication list given to you today.  *If you need a refill on your cardiac medications before your next appointment, please call your pharmacy*  Lab Work: Your physician recommends that you return for lab work TODAY:   BMET If you have labs (blood work) drawn today and your tests are completely normal, you will receive your results only by: Marland Kitchen MyChart Message (if you have MyChart) OR . A paper copy in the mail If you have any lab test that is abnormal or we need to change your treatment, we will call you to review the results.  Testing/Procedures: NONE ordered at this time of appointment   Follow-Up: At [facility], you and your health needs are our priority.  As part of our continuing mission to provide you with exceptional heart care, we have created designated Provider Care Teams.  These Care Teams include your primary Cardiologist (physician) and Advanced Practice Providers (APPs -  Physician Assistants and Nurse Practitioners) who all work together to provide you with the care you need, when you need it.  Your next appointment:   1-2 month(s)  The format for your next appointment:   In Person  Provider:   You may see Sanda Klein, MD or one of the following Advanced Practice Providers on your designated Care Team:    Almyra Deforest, PA-C  Fabian Sharp, [place] or   Roby Lofts, [place]   Other Instructions

## 2019-06-14 NOTE — Patient Instructions (Signed)
Continue 1 tablet daily except 1/2 tablet on Mondays, Wednesdays, and Fridays. Repeat INR in 3 weeks. *Call insurance to check if able to afford Eliquis*

## 2019-06-15 DIAGNOSIS — I350 Nonrheumatic aortic (valve) stenosis: Secondary | ICD-10-CM | POA: Diagnosis not present

## 2019-06-15 DIAGNOSIS — E039 Hypothyroidism, unspecified: Secondary | ICD-10-CM | POA: Diagnosis not present

## 2019-06-15 DIAGNOSIS — I4821 Permanent atrial fibrillation: Secondary | ICD-10-CM | POA: Diagnosis not present

## 2019-06-15 DIAGNOSIS — R21 Rash and other nonspecific skin eruption: Secondary | ICD-10-CM | POA: Diagnosis not present

## 2019-06-15 DIAGNOSIS — H3530 Unspecified macular degeneration: Secondary | ICD-10-CM | POA: Diagnosis not present

## 2019-06-15 DIAGNOSIS — Z955 Presence of coronary angioplasty implant and graft: Secondary | ICD-10-CM | POA: Diagnosis not present

## 2019-06-15 DIAGNOSIS — I2510 Atherosclerotic heart disease of native coronary artery without angina pectoris: Secondary | ICD-10-CM | POA: Diagnosis not present

## 2019-06-15 DIAGNOSIS — E785 Hyperlipidemia, unspecified: Secondary | ICD-10-CM | POA: Diagnosis not present

## 2019-06-15 DIAGNOSIS — Z7901 Long term (current) use of anticoagulants: Secondary | ICD-10-CM | POA: Diagnosis not present

## 2019-06-15 DIAGNOSIS — I272 Pulmonary hypertension, unspecified: Secondary | ICD-10-CM | POA: Diagnosis not present

## 2019-06-15 DIAGNOSIS — I4892 Unspecified atrial flutter: Secondary | ICD-10-CM | POA: Diagnosis not present

## 2019-06-15 DIAGNOSIS — I110 Hypertensive heart disease with heart failure: Secondary | ICD-10-CM | POA: Diagnosis not present

## 2019-06-15 DIAGNOSIS — Z8572 Personal history of non-Hodgkin lymphomas: Secondary | ICD-10-CM | POA: Diagnosis not present

## 2019-06-15 DIAGNOSIS — I5033 Acute on chronic diastolic (congestive) heart failure: Secondary | ICD-10-CM | POA: Diagnosis not present

## 2019-06-17 ENCOUNTER — Ambulatory Visit: Payer: Medicare Other | Attending: Internal Medicine

## 2019-06-17 DIAGNOSIS — I4892 Unspecified atrial flutter: Secondary | ICD-10-CM | POA: Diagnosis not present

## 2019-06-17 DIAGNOSIS — I272 Pulmonary hypertension, unspecified: Secondary | ICD-10-CM | POA: Diagnosis not present

## 2019-06-17 DIAGNOSIS — H3530 Unspecified macular degeneration: Secondary | ICD-10-CM | POA: Diagnosis not present

## 2019-06-17 DIAGNOSIS — I110 Hypertensive heart disease with heart failure: Secondary | ICD-10-CM | POA: Diagnosis not present

## 2019-06-17 DIAGNOSIS — E785 Hyperlipidemia, unspecified: Secondary | ICD-10-CM | POA: Diagnosis not present

## 2019-06-17 DIAGNOSIS — Z7901 Long term (current) use of anticoagulants: Secondary | ICD-10-CM | POA: Diagnosis not present

## 2019-06-17 DIAGNOSIS — I2510 Atherosclerotic heart disease of native coronary artery without angina pectoris: Secondary | ICD-10-CM | POA: Diagnosis not present

## 2019-06-17 DIAGNOSIS — Z8572 Personal history of non-Hodgkin lymphomas: Secondary | ICD-10-CM | POA: Diagnosis not present

## 2019-06-17 DIAGNOSIS — R21 Rash and other nonspecific skin eruption: Secondary | ICD-10-CM | POA: Diagnosis not present

## 2019-06-17 DIAGNOSIS — Z23 Encounter for immunization: Secondary | ICD-10-CM | POA: Insufficient documentation

## 2019-06-17 DIAGNOSIS — Z955 Presence of coronary angioplasty implant and graft: Secondary | ICD-10-CM | POA: Diagnosis not present

## 2019-06-17 DIAGNOSIS — I5033 Acute on chronic diastolic (congestive) heart failure: Secondary | ICD-10-CM | POA: Diagnosis not present

## 2019-06-17 DIAGNOSIS — E039 Hypothyroidism, unspecified: Secondary | ICD-10-CM | POA: Diagnosis not present

## 2019-06-17 DIAGNOSIS — I4821 Permanent atrial fibrillation: Secondary | ICD-10-CM | POA: Diagnosis not present

## 2019-06-17 DIAGNOSIS — I350 Nonrheumatic aortic (valve) stenosis: Secondary | ICD-10-CM | POA: Diagnosis not present

## 2019-06-17 NOTE — Progress Notes (Signed)
   Covid-19 Vaccination Clinic  Name:  [name]    MRN: [id number] DOB: [birth year]  06/17/2019  [name] was observed post Covid-19 immunization for 15 minutes without incidence. She was provided with Vaccine Information Sheet and instruction to access the V-Safe system.   [name] was instructed to call 911 with any severe reactions post vaccine: Marland Kitchen Difficulty breathing  . Swelling of your face and throat  . A fast heartbeat  . A bad rash all over your body  . Dizziness and weakness    Immunizations Administered    Name Date Dose VIS Date Route   Pfizer COVID-19 Vaccine 06/17/2019  9:05 AM 0.3 mL 05/07/2019 Intramuscular   Manufacturer: [employer]   Lot: [id number]   [place]: [phone]

## 2019-06-17 NOTE — Progress Notes (Signed)
I would continue on the current diuretic therapy. Renal function improving, potassium level stable.

## 2019-06-18 DIAGNOSIS — R21 Rash and other nonspecific skin eruption: Secondary | ICD-10-CM | POA: Diagnosis not present

## 2019-06-18 DIAGNOSIS — E039 Hypothyroidism, unspecified: Secondary | ICD-10-CM | POA: Diagnosis not present

## 2019-06-18 DIAGNOSIS — Z7901 Long term (current) use of anticoagulants: Secondary | ICD-10-CM | POA: Diagnosis not present

## 2019-06-18 DIAGNOSIS — I350 Nonrheumatic aortic (valve) stenosis: Secondary | ICD-10-CM | POA: Diagnosis not present

## 2019-06-18 DIAGNOSIS — I4821 Permanent atrial fibrillation: Secondary | ICD-10-CM | POA: Diagnosis not present

## 2019-06-18 DIAGNOSIS — I272 Pulmonary hypertension, unspecified: Secondary | ICD-10-CM | POA: Diagnosis not present

## 2019-06-18 DIAGNOSIS — Z955 Presence of coronary angioplasty implant and graft: Secondary | ICD-10-CM | POA: Diagnosis not present

## 2019-06-18 DIAGNOSIS — I2510 Atherosclerotic heart disease of native coronary artery without angina pectoris: Secondary | ICD-10-CM | POA: Diagnosis not present

## 2019-06-18 DIAGNOSIS — I110 Hypertensive heart disease with heart failure: Secondary | ICD-10-CM | POA: Diagnosis not present

## 2019-06-18 DIAGNOSIS — Z8572 Personal history of non-Hodgkin lymphomas: Secondary | ICD-10-CM | POA: Diagnosis not present

## 2019-06-18 DIAGNOSIS — E785 Hyperlipidemia, unspecified: Secondary | ICD-10-CM | POA: Diagnosis not present

## 2019-06-18 DIAGNOSIS — I5033 Acute on chronic diastolic (congestive) heart failure: Secondary | ICD-10-CM | POA: Diagnosis not present

## 2019-06-18 DIAGNOSIS — I4892 Unspecified atrial flutter: Secondary | ICD-10-CM | POA: Diagnosis not present

## 2019-06-18 DIAGNOSIS — H3530 Unspecified macular degeneration: Secondary | ICD-10-CM | POA: Diagnosis not present

## 2019-06-22 DIAGNOSIS — Z7901 Long term (current) use of anticoagulants: Secondary | ICD-10-CM | POA: Diagnosis not present

## 2019-06-22 DIAGNOSIS — I4821 Permanent atrial fibrillation: Secondary | ICD-10-CM | POA: Diagnosis not present

## 2019-06-22 DIAGNOSIS — I272 Pulmonary hypertension, unspecified: Secondary | ICD-10-CM | POA: Diagnosis not present

## 2019-06-22 DIAGNOSIS — Z8572 Personal history of non-Hodgkin lymphomas: Secondary | ICD-10-CM | POA: Diagnosis not present

## 2019-06-22 DIAGNOSIS — I350 Nonrheumatic aortic (valve) stenosis: Secondary | ICD-10-CM | POA: Diagnosis not present

## 2019-06-22 DIAGNOSIS — H3530 Unspecified macular degeneration: Secondary | ICD-10-CM | POA: Diagnosis not present

## 2019-06-22 DIAGNOSIS — R21 Rash and other nonspecific skin eruption: Secondary | ICD-10-CM | POA: Diagnosis not present

## 2019-06-22 DIAGNOSIS — I2510 Atherosclerotic heart disease of native coronary artery without angina pectoris: Secondary | ICD-10-CM | POA: Diagnosis not present

## 2019-06-22 DIAGNOSIS — I110 Hypertensive heart disease with heart failure: Secondary | ICD-10-CM | POA: Diagnosis not present

## 2019-06-22 DIAGNOSIS — I5033 Acute on chronic diastolic (congestive) heart failure: Secondary | ICD-10-CM | POA: Diagnosis not present

## 2019-06-22 DIAGNOSIS — E785 Hyperlipidemia, unspecified: Secondary | ICD-10-CM | POA: Diagnosis not present

## 2019-06-22 DIAGNOSIS — I4892 Unspecified atrial flutter: Secondary | ICD-10-CM | POA: Diagnosis not present

## 2019-06-22 DIAGNOSIS — Z955 Presence of coronary angioplasty implant and graft: Secondary | ICD-10-CM | POA: Diagnosis not present

## 2019-06-22 DIAGNOSIS — E039 Hypothyroidism, unspecified: Secondary | ICD-10-CM | POA: Diagnosis not present

## 2019-06-24 DIAGNOSIS — I350 Nonrheumatic aortic (valve) stenosis: Secondary | ICD-10-CM | POA: Diagnosis not present

## 2019-06-24 DIAGNOSIS — R21 Rash and other nonspecific skin eruption: Secondary | ICD-10-CM | POA: Diagnosis not present

## 2019-06-24 DIAGNOSIS — I272 Pulmonary hypertension, unspecified: Secondary | ICD-10-CM | POA: Diagnosis not present

## 2019-06-24 DIAGNOSIS — H3530 Unspecified macular degeneration: Secondary | ICD-10-CM | POA: Diagnosis not present

## 2019-06-24 DIAGNOSIS — E039 Hypothyroidism, unspecified: Secondary | ICD-10-CM | POA: Diagnosis not present

## 2019-06-24 DIAGNOSIS — Z8572 Personal history of non-Hodgkin lymphomas: Secondary | ICD-10-CM | POA: Diagnosis not present

## 2019-06-24 DIAGNOSIS — I5033 Acute on chronic diastolic (congestive) heart failure: Secondary | ICD-10-CM | POA: Diagnosis not present

## 2019-06-24 DIAGNOSIS — Z955 Presence of coronary angioplasty implant and graft: Secondary | ICD-10-CM | POA: Diagnosis not present

## 2019-06-24 DIAGNOSIS — I4892 Unspecified atrial flutter: Secondary | ICD-10-CM | POA: Diagnosis not present

## 2019-06-24 DIAGNOSIS — I110 Hypertensive heart disease with heart failure: Secondary | ICD-10-CM | POA: Diagnosis not present

## 2019-06-24 DIAGNOSIS — E785 Hyperlipidemia, unspecified: Secondary | ICD-10-CM | POA: Diagnosis not present

## 2019-06-24 DIAGNOSIS — Z7901 Long term (current) use of anticoagulants: Secondary | ICD-10-CM | POA: Diagnosis not present

## 2019-06-24 DIAGNOSIS — I2510 Atherosclerotic heart disease of native coronary artery without angina pectoris: Secondary | ICD-10-CM | POA: Diagnosis not present

## 2019-06-24 DIAGNOSIS — I4821 Permanent atrial fibrillation: Secondary | ICD-10-CM | POA: Diagnosis not present

## 2019-06-28 DIAGNOSIS — I4892 Unspecified atrial flutter: Secondary | ICD-10-CM | POA: Diagnosis not present

## 2019-06-28 DIAGNOSIS — E785 Hyperlipidemia, unspecified: Secondary | ICD-10-CM | POA: Diagnosis not present

## 2019-06-28 DIAGNOSIS — I350 Nonrheumatic aortic (valve) stenosis: Secondary | ICD-10-CM | POA: Diagnosis not present

## 2019-06-28 DIAGNOSIS — Z7901 Long term (current) use of anticoagulants: Secondary | ICD-10-CM | POA: Diagnosis not present

## 2019-06-28 DIAGNOSIS — I272 Pulmonary hypertension, unspecified: Secondary | ICD-10-CM | POA: Diagnosis not present

## 2019-06-28 DIAGNOSIS — I110 Hypertensive heart disease with heart failure: Secondary | ICD-10-CM | POA: Diagnosis not present

## 2019-06-28 DIAGNOSIS — E039 Hypothyroidism, unspecified: Secondary | ICD-10-CM | POA: Diagnosis not present

## 2019-06-28 DIAGNOSIS — Z955 Presence of coronary angioplasty implant and graft: Secondary | ICD-10-CM | POA: Diagnosis not present

## 2019-06-28 DIAGNOSIS — I4821 Permanent atrial fibrillation: Secondary | ICD-10-CM | POA: Diagnosis not present

## 2019-06-28 DIAGNOSIS — I5033 Acute on chronic diastolic (congestive) heart failure: Secondary | ICD-10-CM | POA: Diagnosis not present

## 2019-06-28 DIAGNOSIS — R21 Rash and other nonspecific skin eruption: Secondary | ICD-10-CM | POA: Diagnosis not present

## 2019-06-28 DIAGNOSIS — Z8572 Personal history of non-Hodgkin lymphomas: Secondary | ICD-10-CM | POA: Diagnosis not present

## 2019-06-28 DIAGNOSIS — H3530 Unspecified macular degeneration: Secondary | ICD-10-CM | POA: Diagnosis not present

## 2019-06-28 DIAGNOSIS — I2510 Atherosclerotic heart disease of native coronary artery without angina pectoris: Secondary | ICD-10-CM | POA: Diagnosis not present

## 2019-06-30 DIAGNOSIS — E039 Hypothyroidism, unspecified: Secondary | ICD-10-CM | POA: Diagnosis not present

## 2019-06-30 DIAGNOSIS — I110 Hypertensive heart disease with heart failure: Secondary | ICD-10-CM | POA: Diagnosis not present

## 2019-06-30 DIAGNOSIS — I2510 Atherosclerotic heart disease of native coronary artery without angina pectoris: Secondary | ICD-10-CM | POA: Diagnosis not present

## 2019-06-30 DIAGNOSIS — I5033 Acute on chronic diastolic (congestive) heart failure: Secondary | ICD-10-CM | POA: Diagnosis not present

## 2019-06-30 DIAGNOSIS — I4892 Unspecified atrial flutter: Secondary | ICD-10-CM | POA: Diagnosis not present

## 2019-06-30 DIAGNOSIS — R21 Rash and other nonspecific skin eruption: Secondary | ICD-10-CM | POA: Diagnosis not present

## 2019-06-30 DIAGNOSIS — Z955 Presence of coronary angioplasty implant and graft: Secondary | ICD-10-CM | POA: Diagnosis not present

## 2019-06-30 DIAGNOSIS — E785 Hyperlipidemia, unspecified: Secondary | ICD-10-CM | POA: Diagnosis not present

## 2019-06-30 DIAGNOSIS — Z7901 Long term (current) use of anticoagulants: Secondary | ICD-10-CM | POA: Diagnosis not present

## 2019-06-30 DIAGNOSIS — H3530 Unspecified macular degeneration: Secondary | ICD-10-CM | POA: Diagnosis not present

## 2019-06-30 DIAGNOSIS — I350 Nonrheumatic aortic (valve) stenosis: Secondary | ICD-10-CM | POA: Diagnosis not present

## 2019-06-30 DIAGNOSIS — Z8572 Personal history of non-Hodgkin lymphomas: Secondary | ICD-10-CM | POA: Diagnosis not present

## 2019-06-30 DIAGNOSIS — I4821 Permanent atrial fibrillation: Secondary | ICD-10-CM | POA: Diagnosis not present

## 2019-06-30 DIAGNOSIS — I272 Pulmonary hypertension, unspecified: Secondary | ICD-10-CM | POA: Diagnosis not present

## 2019-07-02 DIAGNOSIS — Z8572 Personal history of non-Hodgkin lymphomas: Secondary | ICD-10-CM | POA: Diagnosis not present

## 2019-07-02 DIAGNOSIS — R21 Rash and other nonspecific skin eruption: Secondary | ICD-10-CM | POA: Diagnosis not present

## 2019-07-02 DIAGNOSIS — E039 Hypothyroidism, unspecified: Secondary | ICD-10-CM | POA: Diagnosis not present

## 2019-07-02 DIAGNOSIS — Z7901 Long term (current) use of anticoagulants: Secondary | ICD-10-CM | POA: Diagnosis not present

## 2019-07-02 DIAGNOSIS — I272 Pulmonary hypertension, unspecified: Secondary | ICD-10-CM | POA: Diagnosis not present

## 2019-07-02 DIAGNOSIS — Z955 Presence of coronary angioplasty implant and graft: Secondary | ICD-10-CM | POA: Diagnosis not present

## 2019-07-02 DIAGNOSIS — I4892 Unspecified atrial flutter: Secondary | ICD-10-CM | POA: Diagnosis not present

## 2019-07-02 DIAGNOSIS — H3530 Unspecified macular degeneration: Secondary | ICD-10-CM | POA: Diagnosis not present

## 2019-07-02 DIAGNOSIS — I2510 Atherosclerotic heart disease of native coronary artery without angina pectoris: Secondary | ICD-10-CM | POA: Diagnosis not present

## 2019-07-02 DIAGNOSIS — I4821 Permanent atrial fibrillation: Secondary | ICD-10-CM | POA: Diagnosis not present

## 2019-07-02 DIAGNOSIS — I5033 Acute on chronic diastolic (congestive) heart failure: Secondary | ICD-10-CM | POA: Diagnosis not present

## 2019-07-02 DIAGNOSIS — I110 Hypertensive heart disease with heart failure: Secondary | ICD-10-CM | POA: Diagnosis not present

## 2019-07-02 DIAGNOSIS — I350 Nonrheumatic aortic (valve) stenosis: Secondary | ICD-10-CM | POA: Diagnosis not present

## 2019-07-02 DIAGNOSIS — E785 Hyperlipidemia, unspecified: Secondary | ICD-10-CM | POA: Diagnosis not present

## 2019-07-05 DIAGNOSIS — Z8572 Personal history of non-Hodgkin lymphomas: Secondary | ICD-10-CM | POA: Diagnosis not present

## 2019-07-05 DIAGNOSIS — I4821 Permanent atrial fibrillation: Secondary | ICD-10-CM | POA: Diagnosis not present

## 2019-07-05 DIAGNOSIS — R21 Rash and other nonspecific skin eruption: Secondary | ICD-10-CM | POA: Diagnosis not present

## 2019-07-05 DIAGNOSIS — H3530 Unspecified macular degeneration: Secondary | ICD-10-CM | POA: Diagnosis not present

## 2019-07-05 DIAGNOSIS — I350 Nonrheumatic aortic (valve) stenosis: Secondary | ICD-10-CM | POA: Diagnosis not present

## 2019-07-05 DIAGNOSIS — E785 Hyperlipidemia, unspecified: Secondary | ICD-10-CM | POA: Diagnosis not present

## 2019-07-05 DIAGNOSIS — I272 Pulmonary hypertension, unspecified: Secondary | ICD-10-CM | POA: Diagnosis not present

## 2019-07-05 DIAGNOSIS — I4892 Unspecified atrial flutter: Secondary | ICD-10-CM | POA: Diagnosis not present

## 2019-07-05 DIAGNOSIS — Z955 Presence of coronary angioplasty implant and graft: Secondary | ICD-10-CM | POA: Diagnosis not present

## 2019-07-05 DIAGNOSIS — I110 Hypertensive heart disease with heart failure: Secondary | ICD-10-CM | POA: Diagnosis not present

## 2019-07-05 DIAGNOSIS — Z7901 Long term (current) use of anticoagulants: Secondary | ICD-10-CM | POA: Diagnosis not present

## 2019-07-05 DIAGNOSIS — I5033 Acute on chronic diastolic (congestive) heart failure: Secondary | ICD-10-CM | POA: Diagnosis not present

## 2019-07-05 DIAGNOSIS — E039 Hypothyroidism, unspecified: Secondary | ICD-10-CM | POA: Diagnosis not present

## 2019-07-05 DIAGNOSIS — I2510 Atherosclerotic heart disease of native coronary artery without angina pectoris: Secondary | ICD-10-CM | POA: Diagnosis not present

## 2019-07-06 DIAGNOSIS — I5033 Acute on chronic diastolic (congestive) heart failure: Secondary | ICD-10-CM | POA: Diagnosis not present

## 2019-07-06 DIAGNOSIS — Z8572 Personal history of non-Hodgkin lymphomas: Secondary | ICD-10-CM | POA: Diagnosis not present

## 2019-07-06 DIAGNOSIS — Z7901 Long term (current) use of anticoagulants: Secondary | ICD-10-CM | POA: Diagnosis not present

## 2019-07-06 DIAGNOSIS — E039 Hypothyroidism, unspecified: Secondary | ICD-10-CM | POA: Diagnosis not present

## 2019-07-06 DIAGNOSIS — I272 Pulmonary hypertension, unspecified: Secondary | ICD-10-CM | POA: Diagnosis not present

## 2019-07-06 DIAGNOSIS — I110 Hypertensive heart disease with heart failure: Secondary | ICD-10-CM | POA: Diagnosis not present

## 2019-07-06 DIAGNOSIS — E785 Hyperlipidemia, unspecified: Secondary | ICD-10-CM | POA: Diagnosis not present

## 2019-07-06 DIAGNOSIS — I2510 Atherosclerotic heart disease of native coronary artery without angina pectoris: Secondary | ICD-10-CM | POA: Diagnosis not present

## 2019-07-06 DIAGNOSIS — I4821 Permanent atrial fibrillation: Secondary | ICD-10-CM | POA: Diagnosis not present

## 2019-07-06 DIAGNOSIS — Z955 Presence of coronary angioplasty implant and graft: Secondary | ICD-10-CM | POA: Diagnosis not present

## 2019-07-06 DIAGNOSIS — I350 Nonrheumatic aortic (valve) stenosis: Secondary | ICD-10-CM | POA: Diagnosis not present

## 2019-07-06 DIAGNOSIS — R21 Rash and other nonspecific skin eruption: Secondary | ICD-10-CM | POA: Diagnosis not present

## 2019-07-06 DIAGNOSIS — H3530 Unspecified macular degeneration: Secondary | ICD-10-CM | POA: Diagnosis not present

## 2019-07-06 DIAGNOSIS — I4892 Unspecified atrial flutter: Secondary | ICD-10-CM | POA: Diagnosis not present

## 2019-07-07 ENCOUNTER — Encounter (INDEPENDENT_AMBULATORY_CARE_PROVIDER_SITE_OTHER): Payer: Self-pay

## 2019-07-07 ENCOUNTER — Ambulatory Visit (INDEPENDENT_AMBULATORY_CARE_PROVIDER_SITE_OTHER): Payer: Medicare Other | Admitting: Pharmacist Clinician (PhC)/ Clinical Pharmacy Specialist

## 2019-07-07 ENCOUNTER — Other Ambulatory Visit: Payer: Self-pay

## 2019-07-07 DIAGNOSIS — I480 Paroxysmal atrial fibrillation: Secondary | ICD-10-CM | POA: Diagnosis not present

## 2019-07-07 DIAGNOSIS — Z7901 Long term (current) use of anticoagulants: Secondary | ICD-10-CM | POA: Diagnosis not present

## 2019-07-07 LAB — POCT INR: INR: 2.2 (ref 2.0–3.0)

## 2019-07-07 MED ORDER — APIXABAN 2.5 MG PO TABS: 2.5000 mg | ORAL_TABLET | Freq: Two times a day (BID) | ORAL | 1 refills | Status: DC

## 2019-07-07 NOTE — Patient Instructions (Signed)
Stop warfarin.  Start Eliquis 2.5 mg twice daily, with first dose Thursday morning

## 2019-07-08 ENCOUNTER — Ambulatory Visit: Payer: Medicare Other | Attending: Internal Medicine

## 2019-07-08 DIAGNOSIS — Z23 Encounter for immunization: Secondary | ICD-10-CM

## 2019-07-08 NOTE — Progress Notes (Signed)
   Covid-19 Vaccination Clinic  Name:  ELANI DELPH    MRN: [id number] DOB: Mar 31, 1924  07/08/2019  Ms. Blakeley was observed post Covid-19 immunization for 15 minutes without incidence. She was provided with Vaccine Information Sheet and instruction to access the V-Safe system.   Ms. Martinezgarcia was instructed to call 911 with any severe reactions post vaccine: Marland Kitchen Difficulty breathing  . Swelling of your face and throat  . A fast heartbeat  . A bad rash all over your body  . Dizziness and weakness    Immunizations Administered    Name Date Dose VIS Date Route   Pfizer COVID-19 Vaccine 07/08/2019  9:14 AM 0.3 mL 05/07/2019 Intramuscular   Manufacturer: [place]   Lot: [id number]   [place]: [id number]

## 2019-07-12 DIAGNOSIS — Z7901 Long term (current) use of anticoagulants: Secondary | ICD-10-CM | POA: Diagnosis not present

## 2019-07-12 DIAGNOSIS — Z8572 Personal history of non-Hodgkin lymphomas: Secondary | ICD-10-CM | POA: Diagnosis not present

## 2019-07-12 DIAGNOSIS — I4892 Unspecified atrial flutter: Secondary | ICD-10-CM | POA: Diagnosis not present

## 2019-07-12 DIAGNOSIS — R21 Rash and other nonspecific skin eruption: Secondary | ICD-10-CM | POA: Diagnosis not present

## 2019-07-12 DIAGNOSIS — I5033 Acute on chronic diastolic (congestive) heart failure: Secondary | ICD-10-CM | POA: Diagnosis not present

## 2019-07-12 DIAGNOSIS — I110 Hypertensive heart disease with heart failure: Secondary | ICD-10-CM | POA: Diagnosis not present

## 2019-07-12 DIAGNOSIS — I350 Nonrheumatic aortic (valve) stenosis: Secondary | ICD-10-CM | POA: Diagnosis not present

## 2019-07-12 DIAGNOSIS — E039 Hypothyroidism, unspecified: Secondary | ICD-10-CM | POA: Diagnosis not present

## 2019-07-12 DIAGNOSIS — I272 Pulmonary hypertension, unspecified: Secondary | ICD-10-CM | POA: Diagnosis not present

## 2019-07-12 DIAGNOSIS — H3530 Unspecified macular degeneration: Secondary | ICD-10-CM | POA: Diagnosis not present

## 2019-07-12 DIAGNOSIS — I4821 Permanent atrial fibrillation: Secondary | ICD-10-CM | POA: Diagnosis not present

## 2019-07-12 DIAGNOSIS — E785 Hyperlipidemia, unspecified: Secondary | ICD-10-CM | POA: Diagnosis not present

## 2019-07-12 DIAGNOSIS — I2510 Atherosclerotic heart disease of native coronary artery without angina pectoris: Secondary | ICD-10-CM | POA: Diagnosis not present

## 2019-07-12 DIAGNOSIS — Z955 Presence of coronary angioplasty implant and graft: Secondary | ICD-10-CM | POA: Diagnosis not present

## 2019-07-16 DIAGNOSIS — I110 Hypertensive heart disease with heart failure: Secondary | ICD-10-CM | POA: Diagnosis not present

## 2019-07-16 DIAGNOSIS — I5033 Acute on chronic diastolic (congestive) heart failure: Secondary | ICD-10-CM | POA: Diagnosis not present

## 2019-07-16 DIAGNOSIS — H3530 Unspecified macular degeneration: Secondary | ICD-10-CM | POA: Diagnosis not present

## 2019-07-16 DIAGNOSIS — I4821 Permanent atrial fibrillation: Secondary | ICD-10-CM | POA: Diagnosis not present

## 2019-07-16 DIAGNOSIS — R21 Rash and other nonspecific skin eruption: Secondary | ICD-10-CM | POA: Diagnosis not present

## 2019-07-16 DIAGNOSIS — Z955 Presence of coronary angioplasty implant and graft: Secondary | ICD-10-CM | POA: Diagnosis not present

## 2019-07-16 DIAGNOSIS — E039 Hypothyroidism, unspecified: Secondary | ICD-10-CM | POA: Diagnosis not present

## 2019-07-16 DIAGNOSIS — I272 Pulmonary hypertension, unspecified: Secondary | ICD-10-CM | POA: Diagnosis not present

## 2019-07-16 DIAGNOSIS — Z7901 Long term (current) use of anticoagulants: Secondary | ICD-10-CM | POA: Diagnosis not present

## 2019-07-16 DIAGNOSIS — I2510 Atherosclerotic heart disease of native coronary artery without angina pectoris: Secondary | ICD-10-CM | POA: Diagnosis not present

## 2019-07-16 DIAGNOSIS — I4892 Unspecified atrial flutter: Secondary | ICD-10-CM | POA: Diagnosis not present

## 2019-07-16 DIAGNOSIS — E785 Hyperlipidemia, unspecified: Secondary | ICD-10-CM | POA: Diagnosis not present

## 2019-07-16 DIAGNOSIS — Z8572 Personal history of non-Hodgkin lymphomas: Secondary | ICD-10-CM | POA: Diagnosis not present

## 2019-07-16 DIAGNOSIS — I350 Nonrheumatic aortic (valve) stenosis: Secondary | ICD-10-CM | POA: Diagnosis not present

## 2019-07-19 DIAGNOSIS — I110 Hypertensive heart disease with heart failure: Secondary | ICD-10-CM | POA: Diagnosis not present

## 2019-07-19 DIAGNOSIS — I350 Nonrheumatic aortic (valve) stenosis: Secondary | ICD-10-CM | POA: Diagnosis not present

## 2019-07-19 DIAGNOSIS — I5033 Acute on chronic diastolic (congestive) heart failure: Secondary | ICD-10-CM | POA: Diagnosis not present

## 2019-07-19 DIAGNOSIS — I4821 Permanent atrial fibrillation: Secondary | ICD-10-CM | POA: Diagnosis not present

## 2019-07-19 DIAGNOSIS — I272 Pulmonary hypertension, unspecified: Secondary | ICD-10-CM | POA: Diagnosis not present

## 2019-07-19 DIAGNOSIS — Z955 Presence of coronary angioplasty implant and graft: Secondary | ICD-10-CM | POA: Diagnosis not present

## 2019-07-19 DIAGNOSIS — H3530 Unspecified macular degeneration: Secondary | ICD-10-CM | POA: Diagnosis not present

## 2019-07-19 DIAGNOSIS — E785 Hyperlipidemia, unspecified: Secondary | ICD-10-CM | POA: Diagnosis not present

## 2019-07-19 DIAGNOSIS — Z7901 Long term (current) use of anticoagulants: Secondary | ICD-10-CM | POA: Diagnosis not present

## 2019-07-19 DIAGNOSIS — I2510 Atherosclerotic heart disease of native coronary artery without angina pectoris: Secondary | ICD-10-CM | POA: Diagnosis not present

## 2019-07-19 DIAGNOSIS — Z8572 Personal history of non-Hodgkin lymphomas: Secondary | ICD-10-CM | POA: Diagnosis not present

## 2019-07-19 DIAGNOSIS — E039 Hypothyroidism, unspecified: Secondary | ICD-10-CM | POA: Diagnosis not present

## 2019-07-19 DIAGNOSIS — R21 Rash and other nonspecific skin eruption: Secondary | ICD-10-CM | POA: Diagnosis not present

## 2019-07-19 DIAGNOSIS — I4892 Unspecified atrial flutter: Secondary | ICD-10-CM | POA: Diagnosis not present

## 2019-07-21 ENCOUNTER — Ambulatory Visit (INDEPENDENT_AMBULATORY_CARE_PROVIDER_SITE_OTHER): Payer: Medicare Other | Admitting: Physician Assistant

## 2019-07-21 ENCOUNTER — Encounter: Payer: Self-pay | Admitting: Physician Assistant

## 2019-07-21 ENCOUNTER — Other Ambulatory Visit: Payer: Self-pay

## 2019-07-21 VITALS — BP 128/78 | HR 83 | Temp 97.8°F | Ht 63.0 in | Wt 123.0 lb

## 2019-07-21 DIAGNOSIS — I350 Nonrheumatic aortic (valve) stenosis: Secondary | ICD-10-CM | POA: Diagnosis not present

## 2019-07-21 DIAGNOSIS — E782 Mixed hyperlipidemia: Secondary | ICD-10-CM

## 2019-07-21 DIAGNOSIS — I480 Paroxysmal atrial fibrillation: Secondary | ICD-10-CM

## 2019-07-21 DIAGNOSIS — I5032 Chronic diastolic (congestive) heart failure: Secondary | ICD-10-CM | POA: Diagnosis not present

## 2019-07-21 DIAGNOSIS — I10 Essential (primary) hypertension: Secondary | ICD-10-CM | POA: Diagnosis not present

## 2019-07-21 DIAGNOSIS — N179 Acute kidney failure, unspecified: Secondary | ICD-10-CM

## 2019-07-21 NOTE — Patient Instructions (Signed)
Medication Instructions:  Your physician recommends that you continue on your current medications as directed. Please refer to the Current Medication list given to you today.  *If you need a refill on your cardiac medications before your next appointment, please call your pharmacy*   Follow-Up: At [facility], you and your health needs are our priority.  As part of our continuing mission to provide you with exceptional heart care, we have created designated Provider Care Teams.  These Care Teams include your primary Cardiologist (physician) and Advanced Practice Providers (APPs -  Physician Assistants and Nurse Practitioners) who all work together to provide you with the care you need, when you need it.  Your next appointment:   3 month(s)  The format for your next appointment:   In Person  Provider:   Sanda Klein, MD

## 2019-07-21 NOTE — Progress Notes (Signed)
Cardiology Office Note  Date: 07/21/2019   ID: [name], DOB 23-Jun-1923, MRN [id number]  PCP:  [name], MD the Cardiologist:  Sanda Klein, MD Electrophysiologist:  None   Chief Complaint  Patient presents with  . Follow-up    Chronic diastolic heart failure, hypertension, PAF, AS, acute kidney injury    History of Present Illness: [name] is a 86 y.o. female with a history of Chronic diastolic HF (HFpEF), CAD s/p BMS-RCA 2010, Mod. AS, PAF, HTN,HLD, Hypothyroidism, Lymphoma.  Recent admission January 3 for volume overload.  She has been noticing weight gain and lower extremity edema orthopnea.  Spironolactone was ordered to be increased to 25 mg.  Her symptoms worsened and she was admitted with shortness of breath to [facility] emergency department.  She last saw cardiology in August.  There was suspicion of possible transthyretin amyloidosis.  Renal function had decreased and spironolactone was decreased in response.  Last echo in December 2019 showed an ejection fraction of 55%.  Moderate left atrial enlargement, mild to moderate aortic stenosis and small pericardial effusion.  Diastolic function was unable to be evaluated.  Recent hospital admission for increased weight gain and lower extremity edema.  She was seen in [facility] ED May 27, 2018 with complaints of lower extremity edema, orthopnea and increased weight.  She was encouraged to increase her spironolactone to 25 mg.  She was started on Bumex. She lost approximately 7 pounds during her stay and lost approximately 2 L in fluid weight , however creatinine increased from 0.99-1.23 after diuresis.  Bumex was prescribed 1 mg daily as needed for weight gain of 2 pounds in 1 day or 5 pounds in a week at discharge.  History of atrial flutter with rates controlled on metoprolol.  She states in the interim since last visit she had one episode of rapid heart rate for approximately 30 minutes  on one particular day which resolved and she has had no further recurrences.  She has been switched from warfarin to Eliquis due to inability to maintain stable INRs.  She denies any progressive anginal or exertional symptoms, recent palpitations or arrhythmias, orthostatic symptoms, stroke or TIA-like symptoms, blood in stool or urine.  Claudication-like symptoms, DVT or PE-like symptoms, or lower extremity edema.  Denies any PND or orthopnea.  .    Past Medical History:  Diagnosis Date  .  Acute respiratiory failure requiring BiPap 06/24/2011  . Allergy   . Aortic valvular stenosis, moderate to severe 06/24/2011   a. 10/2012 Echo: mod AS, Valve 1.25 cm^2 (VTI), 1.19cm^2 (Vmax).  . Arthritis    "back" (10/30/2012)  . Chronic diastolic CHF (congestive heart failure) ([place])    a. 10/2012 Echo: EF 55-60%, no rwma, Gr 2 DD, moderate AS, mod dil LA, mildly to mod dil RA.  Marland Kitchen Coronary artery disease    a. 03/2009 PCI RCA (3.0x18 BMS).  . Dyslipidemia   . Exertional shortness of breath   . Hypertension   . Hypothyroidism   . PAF (paroxysmal atrial fibrillation) ([place])    a. CHA2DS2VASc = 7-->chronic coumadin;  b. s/p DCCV 05/2011, 05/2012, 07/2014;  c. On amio.  . Rash    a. felt to be 2/2 lasix/torsemide in setting of sulfa allergy (lasix d/c ~ 06/2014, torsemide d/c 08/09/2014).    Past Surgical History:  Procedure Laterality Date  . ANGIOPLASTY    . APPENDECTOMY    . BACK SURGERY    . CARDIAC  CATHETERIZATION  10/06/2009   Continue medical therapy  . CARDIAC CATHETERIZATION  04/07/2009   RCA stented with a 3x65mm stent resulting in a reduction of 90% narrowing to normal  . CARDIOVERSION  06/19/2011   Procedure: CARDIOVERSION;  Surgeon: Sanda Klein, MD;  Location: [place] OR;  Service: Cardiovascular;  Laterality: N/A;  . CARDIOVERSION  07/11/2010   Successful coversion to sinus rhythm  . CARDIOVERSION N/A 08/10/2014   Procedure: CARDIOVERSION;  Surgeon: Sanda Klein, MD;  Location: [facility];   Service: Cardiovascular;  Laterality: N/A;  . CATARACT EXTRACTION W/ INTRAOCULAR LENS  IMPLANT, BILATERAL    . CORONARY ANGIOPLASTY WITH STENT PLACEMENT     "1" (10/30/2012)  . DILATION AND CURETTAGE OF UTERUS    . EXCISIONAL HEMORRHOIDECTOMY    . FRACTURE SURGERY    . HIP ARTHROPLASTY Right 04/17/2015   Procedure: HIP MONOPOLAR HIP HEMI ARTHROPLASTY;  Surgeon: Marybelle Killings, MD;  Location: [place];  Service: Orthopedics;  Laterality: Right;  . IR THORACENTESIS ASP PLEURAL SPACE W/IMG GUIDE  04/28/2018  . IR THORACENTESIS ASP PLEURAL SPACE W/IMG GUIDE  05/26/2018  . LUMBAR DISC SURGERY  X 2   "cleaned out scar tissue"   . SHOULDER ACROMIOPLASTY Left    "fell; put a new ball in" (10/30/2012)  . WRIST FRACTURE SURGERY Left     Current Outpatient Medications  Medication Sig Dispense Refill  . acetaminophen (TYLENOL) 650 MG CR tablet Take 650 mg by mouth 3 (three) times daily. Morning, supper and bedtime    . apixaban (ELIQUIS) 2.5 MG TABS tablet Take 1 tablet (2.5 mg total) by mouth 2 (two) times daily. 180 tablet 1  . atorvastatin (LIPITOR) 40 MG tablet TAKE 1 TABLET DAILY AT 6 P.M. (Patient taking differently: Take 40 mg by mouth daily at 6 PM. ) 90 tablet 3  . augmented betamethasone dipropionate (DIPROLENE-AF) 0.05 % cream Apply 1 application topically daily.     . bumetanide (BUMEX) 1 MG tablet Take 1 tablet (1 mg total) by mouth daily as needed (for wt gain of 2 pounds in a day or 5 pounds in a week, swelling or shortness of breath). 30 tablet 11  . Cholecalciferol (VITAMIN D) 50 MCG (2000 UT) tablet Take 2,000 Units by mouth daily.    . diphenhydramine-acetaminophen (ACETAMINOPHEN PM) 25-500 MG TABS tablet Take 1 tablet by mouth at bedtime as needed.    . ezetimibe (ZETIA) 10 MG tablet Take 1 tablet (10 mg total) by mouth daily. 90 tablet 1  . folic acid (FOLVITE) 1 MG tablet TAKE 2 TABLETS DAILY (Patient taking differently: Take 2 mg by mouth daily. ) 180 tablet 3  . ipratropium (ATROVENT)  0.03 % nasal spray Place 2 sprays into both nostrils 3 (three) times daily as needed for rhinitis. 30 mL 0  . levothyroxine (SYNTHROID) 88 MCG tablet TAKE 1 TABLET DAILY BEFORE BREAKFAST (Patient taking differently: Take 88 mcg by mouth daily before breakfast. ) 90 tablet 1  . loratadine (CLARITIN) 10 MG tablet Take 10 mg by mouth daily.    . magnesium hydroxide (MILK OF MAGNESIA) 400 MG/5ML suspension Take 15 mLs by mouth daily as needed for mild constipation. 360 mL 0  . metoprolol tartrate (LOPRESSOR) 25 MG tablet TAKE 1 TABLET TWICE A DAY (Patient taking differently: Take 25 mg by mouth 2 (two) times daily. ) 180 tablet 2  . multivitamin-iron-minerals-folic acid (CENTRUM) chewable tablet Chew 1 tablet by mouth daily.    Marland Kitchen OVER THE COUNTER MEDICATION Take 1  tablet by mouth See admin instructions. Stool softener over the counter by mouth daily  Fiber    . OVER THE COUNTER MEDICATION Take 1 tablet by mouth See admin instructions. Over the counter pill for osteoarthritis by mouth three time daily    . spironolactone (ALDACTONE) 25 MG tablet Take 1 tablet (25 mg total) by mouth daily. 90 tablet 3  . triamcinolone cream (KENALOG) 0.1 % APPLY CREAM EXTERNALLY TO AFFECTED AREA TWICE DAILY AS NEEDED (NOT FACE GROIN OR UNDERARMS)    . vitamin C (ASCORBIC ACID) 500 MG tablet Take 500 mg by mouth daily.     No current facility-administered medications for this visit.   Allergies:  [name] [ethacrynic acid], Furosemide, Sulfa antibiotics, Tape, [name], and Torsemide   Social History: The patient  reports that she has never smoked. She has never used smokeless tobacco. She reports that she does not drink alcohol or use drugs.   Family History: The patient's family history includes Alcohol abuse in her child; Cancer in her son; Cancer (age of onset: 29) in her mother; Heart attack in her father; Heart disease in her brother, brother, and father; Heart disease (age of onset: 97) in her mother;  Heart failure in her mother; Hypertension in her brother, father, mother, and sister; Hypertension (age of onset: 31) in her child; Lung disease in her brother; Stroke (age of onset: 41) in her mother.   ROS:  Please see the history of present illness. Otherwise, complete review of systems is positive for none.  All other systems are reviewed and negative.   Physical Exam: VS:  BP 128/78   Pulse 83   Temp 97.8 F (36.6 C)   Ht 5\' 3"  (1.6 m)   Wt 123 lb (55.8 kg)   SpO2 99%   BMI 21.79 kg/m , BMI Body mass index is 21.79 kg/m.  Wt Readings from Last 3 Encounters:  07/21/19 123 lb (55.8 kg)  06/14/19 122 lb 3.2 oz (55.4 kg)  06/03/19 124 lb 12.8 oz (56.6 kg)    General: Patient appears comfortable at rest. Neck: Supple, no elevated JVP or carotid bruits, no thyromegaly. Lungs: Clear to auscultation, nonlabored breathing at rest. Cardiac: Regular rate and rhythm, no S3 or significant systolic murmur, no pericardial rub. Extremities: No pitting edema, distal pulses 2+. Skin: Warm and dry. Musculoskeletal: No kyphosis. Neuropsychiatric: Alert and oriented x3, affect grossly appropriate.  ECG:  An ECG dated 06/15/2019 was personally reviewed today and demonstrated:  Sinus rhythm first-degree AV block rate of 81.  LAD, LBBB  Recent Labwork: 05/30/2019: B Natriuretic Peptide 345.4 05/31/2019: Magnesium 2.3 06/03/2019: ALT 15; AST 19; Hemoglobin 12.3; Platelets 311 06/14/2019: BUN 32; Creatinine, Ser 1.44; Potassium 4.7; Sodium 141     Component Value Date/Time   CHOL 120 01/20/2019 0956   TRIG 92 01/20/2019 0956   HDL 42 01/20/2019 0956   CHOLHDL 2.9 01/20/2019 0956   CHOLHDL 3.9 05/10/2016 0958   VLDL 28 05/10/2016 0958   LDLCALC 60 01/20/2019 0956    Other Studies Reviewed Today:  Echocardiogram April 28, 2018 Study Conclusions  - Left ventricle: The cavity size was normal. Wall thickness was increased in a pattern of mild LVH. Systolic function was normal. The  estimated ejection fraction was in the range of 55% to 60%. Wall motion was normal; there were no regional wall motion abnormalities. The study is not technically sufficient to allow evaluation of LV diastolic function. Doppler parameters are consistent with high ventricular filling pressure. -  Aortic valve: Valve mobility was restricted. There was mild to moderate stenosis. There was trivial regurgitation. - Mitral valve: Severely calcified annulus. Mildly thickened leaflets . - Left atrium: The atrium was moderately dilated. - Pulmonary arteries: Systolic pressure was mildly increased. PA peak pressure: 46 mm Hg (S). - Pericardium, extracardiac: A small pericardial effusion was identified. There was a left pleural effusion.  Impressions:  - Definity used; normal LV systolic function; mild LVH; calcified aortic valve with mild to moderate AS (mean gradient 18 mmHg); trace AI; moderate LAE; mild TR; mild pulmonary hypertension; probable small pericardial effusion.    Assessment and Plan:  1. Chronic diastolic heart failure ([place])   2. Essential hypertension   3. PAF (paroxysmal atrial fibrillation) ([place])   4. Aortic valve stenosis, etiology of cardiac valve disease unspecified   5. AKI (acute kidney injury) ([place])    1. Chronic diastolic heart failure ([place]) She denies any recent weight gain, dyspnea on exertion, lower extremity edema since last visit.  Weight has been stable in the low 120s.  Today's weight 122 pounds.  Continue Bumex as directed for weight gain of 2 pounds in a day or 5 pounds in a week or swelling/shortness of breath.  Continue metoprolol 25 mg p.o. twice daily.  Continue spironolactone 25 mg daily.  2. Essential hypertension Blood pressure today 128/78. Blood pressures have been well controlled. Continue beta-blocker.   3. PAF (paroxysmal atrial fibrillation) ([facility]) blood pressure EKG on 1/18 SR with 1st degree AV block LBBB.  Patient  states she had one episode of rapid heartbeat since the last visit without symptoms.  States it lasted approximately 30 minutes and resolved.  She has had no further recurrences of palpitations.  Continue metoprolol 25 mg p.o. twice daily for rate control.  Patient was recently converted from warfarin to Eliquis secondary to fluctuating INRs on Coumadin.  4. Aortic valve stenosis, etiology of cardiac valve disease unspecified Mild - Mod As with mean gradient 18 mmHg echo 12.3.2019.  She has a mild 2 2/6 systolic ejection murmur best heard at right upper sternal border.  5. AKI (acute kidney injury) ([place]) BMET 06/14/2019 Crt 1.44 GFR 31 Improved from labs drawn on 06/03/2019 when Crt 1.78, GFR 24.  6.HLD Continue atorvastatin 40 mg and Zetia 10 mg.  Most recent lipid panel 6 months ago showed total cholesterol 120, triglycerides 92, HDL 42, LDL 60  Medication Adjustments/Labs and Tests Ordered: Current medicines are reviewed at length with the patient today.  Concerns regarding medicines are outlined above.     Signed, Levell July, NP 07/21/2019 10:16 AM    [place]

## 2019-07-22 DIAGNOSIS — I2510 Atherosclerotic heart disease of native coronary artery without angina pectoris: Secondary | ICD-10-CM | POA: Diagnosis not present

## 2019-07-22 DIAGNOSIS — R21 Rash and other nonspecific skin eruption: Secondary | ICD-10-CM | POA: Diagnosis not present

## 2019-07-22 DIAGNOSIS — Z8572 Personal history of non-Hodgkin lymphomas: Secondary | ICD-10-CM | POA: Diagnosis not present

## 2019-07-22 DIAGNOSIS — I4821 Permanent atrial fibrillation: Secondary | ICD-10-CM | POA: Diagnosis not present

## 2019-07-22 DIAGNOSIS — I110 Hypertensive heart disease with heart failure: Secondary | ICD-10-CM | POA: Diagnosis not present

## 2019-07-22 DIAGNOSIS — Z7901 Long term (current) use of anticoagulants: Secondary | ICD-10-CM | POA: Diagnosis not present

## 2019-07-22 DIAGNOSIS — H3530 Unspecified macular degeneration: Secondary | ICD-10-CM | POA: Diagnosis not present

## 2019-07-22 DIAGNOSIS — E039 Hypothyroidism, unspecified: Secondary | ICD-10-CM | POA: Diagnosis not present

## 2019-07-22 DIAGNOSIS — I4892 Unspecified atrial flutter: Secondary | ICD-10-CM | POA: Diagnosis not present

## 2019-07-22 DIAGNOSIS — I272 Pulmonary hypertension, unspecified: Secondary | ICD-10-CM | POA: Diagnosis not present

## 2019-07-22 DIAGNOSIS — Z955 Presence of coronary angioplasty implant and graft: Secondary | ICD-10-CM | POA: Diagnosis not present

## 2019-07-22 DIAGNOSIS — I350 Nonrheumatic aortic (valve) stenosis: Secondary | ICD-10-CM | POA: Diagnosis not present

## 2019-07-22 DIAGNOSIS — E785 Hyperlipidemia, unspecified: Secondary | ICD-10-CM | POA: Diagnosis not present

## 2019-07-22 DIAGNOSIS — I5033 Acute on chronic diastolic (congestive) heart failure: Secondary | ICD-10-CM | POA: Diagnosis not present

## 2019-07-26 DIAGNOSIS — Z8572 Personal history of non-Hodgkin lymphomas: Secondary | ICD-10-CM | POA: Diagnosis not present

## 2019-07-26 DIAGNOSIS — R21 Rash and other nonspecific skin eruption: Secondary | ICD-10-CM | POA: Diagnosis not present

## 2019-07-26 DIAGNOSIS — I5033 Acute on chronic diastolic (congestive) heart failure: Secondary | ICD-10-CM | POA: Diagnosis not present

## 2019-07-26 DIAGNOSIS — I272 Pulmonary hypertension, unspecified: Secondary | ICD-10-CM | POA: Diagnosis not present

## 2019-07-26 DIAGNOSIS — I110 Hypertensive heart disease with heart failure: Secondary | ICD-10-CM | POA: Diagnosis not present

## 2019-07-26 DIAGNOSIS — E039 Hypothyroidism, unspecified: Secondary | ICD-10-CM | POA: Diagnosis not present

## 2019-07-26 DIAGNOSIS — I4821 Permanent atrial fibrillation: Secondary | ICD-10-CM | POA: Diagnosis not present

## 2019-07-26 DIAGNOSIS — H3530 Unspecified macular degeneration: Secondary | ICD-10-CM | POA: Diagnosis not present

## 2019-07-26 DIAGNOSIS — E785 Hyperlipidemia, unspecified: Secondary | ICD-10-CM | POA: Diagnosis not present

## 2019-07-26 DIAGNOSIS — I350 Nonrheumatic aortic (valve) stenosis: Secondary | ICD-10-CM | POA: Diagnosis not present

## 2019-07-26 DIAGNOSIS — I2510 Atherosclerotic heart disease of native coronary artery without angina pectoris: Secondary | ICD-10-CM | POA: Diagnosis not present

## 2019-07-26 DIAGNOSIS — Z7901 Long term (current) use of anticoagulants: Secondary | ICD-10-CM | POA: Diagnosis not present

## 2019-07-26 DIAGNOSIS — I4892 Unspecified atrial flutter: Secondary | ICD-10-CM | POA: Diagnosis not present

## 2019-07-26 DIAGNOSIS — Z955 Presence of coronary angioplasty implant and graft: Secondary | ICD-10-CM | POA: Diagnosis not present

## 2019-07-27 DIAGNOSIS — Z7901 Long term (current) use of anticoagulants: Secondary | ICD-10-CM | POA: Diagnosis not present

## 2019-07-27 DIAGNOSIS — I4892 Unspecified atrial flutter: Secondary | ICD-10-CM | POA: Diagnosis not present

## 2019-07-27 DIAGNOSIS — Z8572 Personal history of non-Hodgkin lymphomas: Secondary | ICD-10-CM | POA: Diagnosis not present

## 2019-07-27 DIAGNOSIS — R21 Rash and other nonspecific skin eruption: Secondary | ICD-10-CM | POA: Diagnosis not present

## 2019-07-27 DIAGNOSIS — H3530 Unspecified macular degeneration: Secondary | ICD-10-CM | POA: Diagnosis not present

## 2019-07-27 DIAGNOSIS — I272 Pulmonary hypertension, unspecified: Secondary | ICD-10-CM | POA: Diagnosis not present

## 2019-07-27 DIAGNOSIS — I4821 Permanent atrial fibrillation: Secondary | ICD-10-CM | POA: Diagnosis not present

## 2019-07-27 DIAGNOSIS — I2510 Atherosclerotic heart disease of native coronary artery without angina pectoris: Secondary | ICD-10-CM | POA: Diagnosis not present

## 2019-07-27 DIAGNOSIS — I350 Nonrheumatic aortic (valve) stenosis: Secondary | ICD-10-CM | POA: Diagnosis not present

## 2019-07-27 DIAGNOSIS — I110 Hypertensive heart disease with heart failure: Secondary | ICD-10-CM | POA: Diagnosis not present

## 2019-07-27 DIAGNOSIS — E039 Hypothyroidism, unspecified: Secondary | ICD-10-CM | POA: Diagnosis not present

## 2019-07-27 DIAGNOSIS — I5033 Acute on chronic diastolic (congestive) heart failure: Secondary | ICD-10-CM | POA: Diagnosis not present

## 2019-07-27 DIAGNOSIS — E785 Hyperlipidemia, unspecified: Secondary | ICD-10-CM | POA: Diagnosis not present

## 2019-07-27 DIAGNOSIS — Z955 Presence of coronary angioplasty implant and graft: Secondary | ICD-10-CM | POA: Diagnosis not present

## 2019-08-03 ENCOUNTER — Encounter: Payer: Self-pay | Admitting: Family Medicine

## 2019-08-03 ENCOUNTER — Ambulatory Visit (INDEPENDENT_AMBULATORY_CARE_PROVIDER_SITE_OTHER): Payer: Medicare Other | Admitting: Family Medicine

## 2019-08-03 ENCOUNTER — Other Ambulatory Visit: Payer: Self-pay

## 2019-08-03 VITALS — Temp 97.8°F | Wt 120.0 lb

## 2019-08-03 DIAGNOSIS — G3184 Mild cognitive impairment, so stated: Secondary | ICD-10-CM
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
## 2019-08-04 ENCOUNTER — Other Ambulatory Visit: Payer: Self-pay | Admitting: Cardiovascular Disease

## 2019-08-04 NOTE — Telephone Encounter (Signed)
  Patient is calling because she has switched insurance companies and she can no longer get her medications at [facility]. Now she will need to get her prescriptions at [id number] - [place], [place] - [address] AT [address]. She would like new prescriptions sent to this pharmacy so that they will have them on file when she needs her next refill. Medications listed below:  apixaban (ELIQUIS) 2.5 MG TABS tablet  atorvastatin (LIPITOR) 40 MG tablet  bumetanide (BUMEX) 1 MG tablet  ezetimibe (ZETIA) 10 MG tablet  folic acid (FOLVITE) 1 MG tablet  spironolactone (ALDACTONE) 25 MG tablet

## 2019-08-05 ENCOUNTER — Other Ambulatory Visit: Payer: Self-pay

## 2019-08-05 ENCOUNTER — Telehealth: Payer: Self-pay

## 2019-08-05 DIAGNOSIS — J00 Acute nasopharyngitis [common cold]: Secondary | ICD-10-CM

## 2019-08-05 DIAGNOSIS — E875 Hyperkalemia: Secondary | ICD-10-CM

## 2019-08-05 MED ORDER — TRIAMCINOLONE ACETONIDE 0.1 % EX CREA: TOPICAL_CREAM | CUTANEOUS | 1 refills | Status: AC

## 2019-08-05 MED ORDER — EZETIMIBE 10 MG PO TABS: 10.0000 mg | ORAL_TABLET | Freq: Every day | ORAL | 1 refills | Status: DC

## 2019-08-05 MED ORDER — LEVOTHYROXINE SODIUM 88 MCG PO TABS: 88.0000 ug | ORAL_TABLET | Freq: Every day | ORAL | 1 refills | Status: DC

## 2019-08-05 MED ORDER — IPRATROPIUM BROMIDE 0.03 % NA SOLN: 2.0000 | Freq: Three times a day (TID) | NASAL | 0 refills | Status: AC | PRN

## 2019-08-05 MED ORDER — APIXABAN 2.5 MG PO TABS: 2.5000 mg | ORAL_TABLET | Freq: Two times a day (BID) | ORAL | 1 refills | Status: DC

## 2019-08-05 MED ORDER — BUMETANIDE 1 MG PO TABS: 1.0000 mg | ORAL_TABLET | Freq: Every day | ORAL | 11 refills | Status: DC | PRN

## 2019-08-05 MED ORDER — FOLIC ACID 1 MG PO TABS: 2.0000 mg | ORAL_TABLET | Freq: Every day | ORAL | 3 refills | Status: DC

## 2019-08-05 MED ORDER — SPIRONOLACTONE 25 MG PO TABS: 25.0000 mg | ORAL_TABLET | Freq: Every day | ORAL | 3 refills | Status: DC

## 2019-08-05 MED ORDER — METOPROLOL TARTRATE 25 MG PO TABS: 25.0000 mg | ORAL_TABLET | Freq: Two times a day (BID) | ORAL | 2 refills | Status: AC

## 2019-08-05 NOTE — Telephone Encounter (Signed)
Error

## 2019-09-27 ENCOUNTER — Other Ambulatory Visit: Payer: Self-pay | Admitting: Family Medicine

## 2019-09-28 ENCOUNTER — Other Ambulatory Visit: Payer: Self-pay | Admitting: Family Medicine

## 2019-09-28 DIAGNOSIS — E875 Hyperkalemia: Secondary | ICD-10-CM

## 2019-11-03 ENCOUNTER — Other Ambulatory Visit: Payer: Self-pay

## 2019-11-03 ENCOUNTER — Ambulatory Visit (INDEPENDENT_AMBULATORY_CARE_PROVIDER_SITE_OTHER): Payer: Medicare Other | Admitting: Cardiovascular Disease

## 2019-11-03 ENCOUNTER — Encounter: Payer: Self-pay | Admitting: Cardiovascular Disease

## 2019-11-03 VITALS — BP 118/68 | HR 65 | Ht 63.0 in | Wt 121.0 lb

## 2019-11-03 DIAGNOSIS — I4821 Permanent atrial fibrillation: Secondary | ICD-10-CM

## 2019-11-03 DIAGNOSIS — E7800 Pure hypercholesterolemia, unspecified: Secondary | ICD-10-CM

## 2019-11-03 DIAGNOSIS — I5032 Chronic diastolic (congestive) heart failure: Secondary | ICD-10-CM | POA: Diagnosis not present

## 2019-11-03 DIAGNOSIS — I2721 Secondary pulmonary arterial hypertension: Secondary | ICD-10-CM | POA: Diagnosis not present

## 2019-11-03 DIAGNOSIS — I10 Essential (primary) hypertension: Secondary | ICD-10-CM

## 2019-11-03 DIAGNOSIS — Z7901 Long term (current) use of anticoagulants: Secondary | ICD-10-CM

## 2019-11-03 DIAGNOSIS — I2510 Atherosclerotic heart disease of native coronary artery without angina pectoris: Secondary | ICD-10-CM

## 2019-11-03 DIAGNOSIS — I350 Nonrheumatic aortic (valve) stenosis: Secondary | ICD-10-CM

## 2019-11-03 NOTE — Patient Instructions (Addendum)
Medication Instructions:  Continue current medications  *If you need a refill on your cardiac medications before your next appointment, please call your pharmacy*   Lab Work: None ordered   Testing/Procedures: Your physician has requested that you have an echocardiogram. Echocardiography is a painless test that uses sound waves to create images of your heart. It provides your doctor with information about the size and shape of your heart and how well your heart's chambers and valves are working. This procedure takes approximately one hour. There are no restrictions for this procedure.   Follow-Up: At [facility], you and your health needs are our priority.  As part of our continuing mission to provide you with exceptional heart care, we have created designated Provider Care Teams.  These Care Teams include your primary Cardiologist (physician) and Advanced Practice Providers (APPs -  Physician Assistants and Nurse Practitioners) who all work together to provide you with the care you need, when you need it.  We recommend signing up for the patient portal called "MyChart".  Sign up information is provided on this After Visit Summary.  MyChart is used to connect with patients for Virtual Visits (Telemedicine).  Patients are able to view lab/test results, encounter notes, upcoming appointments, etc.  Non-urgent messages can be sent to your provider as well.   To learn more about what you can do with MyChart, go to [email or web address].    Your next appointment:   6 month(s)  The format for your next appointment:   In Person  Provider:   You may see Sanda Klein, MD or one of the following Advanced Practice Providers on your designated Care Team:    Almyra Deforest, PA-C  Fabian Sharp, PA-C or   Roby Lofts, [place]

## 2019-11-03 NOTE — Progress Notes (Signed)
Cardiology Office Note    Date:  11/03/2019   ID:  [name], DOB [birth year], MRN [id number]  PCP:  [name], MD  Cardiologist:   Sanda Klein, MD   No chief complaint on file.   History of Present Illness:  [name] is a 86 y.o. female with chronic diastolic heart failure, coronary artery disease, moderate aortic stenosis, persistent atrial fibrillation and hyperlipidemia returning for follow-up visit.  She was hospitalized for acute diastolic heart failure in January and was seen in follow-up by Almyra Deforest February 2021.  Diuretic therapy is challenged by severe allergic skin reaction to both thiazide and loop diuretics (furosemide and torsemide) as well as intractable nausea when treated with ethacrynic acid.  She is on chronic treatment with spironolactone.  Bumex was given during the recent hospitalization and she seems to be tolerating it.  She only takes it on an as needed basis, on the average every 3 to 4 weeks.  She has not had any skin rash with this, and her skin is improving with moisturizer.    She was in one of her cluttered closets last week. A box fell on her head and she has a flat faint ecchymosis on the left side of her forehead.  She did not have serious impact and denies headache or any neurological complaints.  She continues to have easy bruising.  She is on a lower dose of Eliquis, adjusted for her age and low body mass.  She remains in permanent atrial fibrillation completely unaware of the palpitations.  Her rate is well controlled with a ventricular rate of 65 bpm today, on a low-dose of metoprolol.  She continues to live alone, and the house that she shared with Sheran Spine for 2 years of marriage.  She has somebody spending the night with her 4 days a week and her son usually stays there on the weekends.  Her low-grade lymphoma is still being treated with watchful waiting.  She has coronary artery disease and received a bare-metal stent (3x18 mm  integrity) to the right coronary in 2010. She has not had significant angina pectoris since.  She has diastolic heart failure with a remote acute exacerbation in June 2014 and March 2016 and January 2021. Her echo shows an ejection fraction of 55-60% and pseudo-normal mitral inflow.  She has moderate aortic stenosis (DVI 0.29, mean gradient 18 mm Hg,  no significant change Sept 2016-December 2019). She had recurrent persistent atrial fibrillation requiring multiple cardioversions, failed amiodarone therapy. She has treated hyperlipidemia, hypertension and hypothyroidism. 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  Past Medical History:  Diagnosis Date  .  Acute respiratiory failure requiring BiPap 06/24/2011  . Allergy   . Aortic valvular stenosis, moderate to severe 06/24/2011   a. 10/2012 Echo: mod AS, Valve 1.25 cm^2 (VTI), 1.19cm^2 (Vmax).  . Arthritis    "back" (10/30/2012)  . Chronic diastolic CHF (congestive heart failure) ([place])    a. 10/2012 Echo: EF 55-60%, no rwma, Gr 2 DD, moderate AS, mod dil LA, mildly to mod dil RA.  Marland Kitchen Coronary artery disease    a. 03/2009 PCI RCA (3.0x18 BMS).  . Dyslipidemia   . Exertional shortness of breath   . Hypertension   . Hypothyroidism   . PAF (paroxysmal atrial fibrillation) ([place])    a. CHA2DS2VASc = 7-->chronic coumadin;  b. s/p DCCV 05/2011, 05/2012, 07/2014;  c. On amio.  . Rash    a. felt to be 2/2  lasix/torsemide in setting of sulfa allergy (lasix d/c ~ 06/2014, torsemide d/c 08/09/2014).    Past Surgical History:  Procedure Laterality Date  . ANGIOPLASTY    . APPENDECTOMY    . BACK SURGERY    . CARDIAC CATHETERIZATION  10/06/2009   Continue medical therapy  . CARDIAC CATHETERIZATION  04/07/2009   RCA stented with a 3x39mm stent resulting in a reduction of 90% narrowing to normal  . CARDIOVERSION  06/19/2011   Procedure: CARDIOVERSION;  Surgeon: Sanda Klein, MD;  Location: [facility];  Service: Cardiovascular;   Laterality: N/A;  . CARDIOVERSION  07/11/2010   Successful coversion to sinus rhythm  . CARDIOVERSION N/A 08/10/2014   Procedure: CARDIOVERSION;  Surgeon: Sanda Klein, MD;  Location: [facility];  Service: Cardiovascular;  Laterality: N/A;  . CATARACT EXTRACTION W/ INTRAOCULAR LENS  IMPLANT, BILATERAL    . CORONARY ANGIOPLASTY WITH STENT PLACEMENT     "1" (10/30/2012)  . DILATION AND CURETTAGE OF UTERUS    . EXCISIONAL HEMORRHOIDECTOMY    . FRACTURE SURGERY    . HIP ARTHROPLASTY Right 04/17/2015   Procedure: HIP MONOPOLAR HIP HEMI ARTHROPLASTY;  Surgeon: Marybelle Killings, MD;  Location: [facility];  Service: Orthopedics;  Laterality: Right;  . IR THORACENTESIS ASP PLEURAL SPACE W/IMG GUIDE  04/28/2018  . IR THORACENTESIS ASP PLEURAL SPACE W/IMG GUIDE  05/26/2018  . LUMBAR DISC SURGERY  X 2   "cleaned out scar tissue"   . SHOULDER ACROMIOPLASTY Left    "fell; put a new ball in" (10/30/2012)  . WRIST FRACTURE SURGERY Left     Current Medications: Outpatient Medications Prior to Visit  Medication Sig Dispense Refill  . acetaminophen (TYLENOL) 650 MG CR tablet Take 650 mg by mouth 3 (three) times daily. Morning, supper and bedtime    . apixaban (ELIQUIS) 2.5 MG TABS tablet Take 1 tablet (2.5 mg total) by mouth 2 (two) times daily. 180 tablet 1  . atorvastatin (LIPITOR) 40 MG tablet TAKE 1 TABLET DAILY AT 6 P.M. (Patient taking differently: Take 40 mg by mouth daily at 6 PM. ) 90 tablet 3  . augmented betamethasone dipropionate (DIPROLENE-AF) 0.05 % cream Apply 1 application topically daily.     . bumetanide (BUMEX) 1 MG tablet Take 1 tablet (1 mg total) by mouth daily as needed (for wt gain of 2 pounds in a day or 5 pounds in a week, swelling or shortness of breath). 30 tablet 11  . Cholecalciferol (VITAMIN D) 50 MCG (2000 UT) tablet Take 2,000 Units by mouth daily.    . diphenhydramine-acetaminophen (ACETAMINOPHEN PM) 25-500 MG TABS tablet Take 1 tablet by mouth at bedtime as needed.    . ezetimibe  (ZETIA) 10 MG tablet TAKE 1 TABLET(10 MG) BY MOUTH DAILY 90 tablet 1  . folic acid (FOLVITE) 1 MG tablet TAKE 2 TABLETS(2 MG) BY MOUTH DAILY 90 tablet 1  . ipratropium (ATROVENT) 0.03 % nasal spray Place 2 sprays into both nostrils 3 (three) times daily as needed for rhinitis. 30 mL 0  . levothyroxine (SYNTHROID) 88 MCG tablet Take 1 tablet (88 mcg total) by mouth daily before breakfast. 90 tablet 1  . loratadine (CLARITIN) 10 MG tablet Take 10 mg by mouth daily.    . magnesium hydroxide (MILK OF MAGNESIA) 400 MG/5ML suspension Take 15 mLs by mouth daily as needed for mild constipation. 360 mL 0  . metoprolol tartrate (LOPRESSOR) 25 MG tablet Take 1 tablet (25 mg total) by mouth 2 (two) times daily. 180 tablet 2  .  multivitamin-iron-minerals-folic acid (CENTRUM) chewable tablet Chew 1 tablet by mouth daily.    Marland Kitchen OVER THE COUNTER MEDICATION Take 1 tablet by mouth See admin instructions. Stool softener over the counter by mouth daily  Fiber    . OVER THE COUNTER MEDICATION Take 1 tablet by mouth See admin instructions. Over the counter pill for osteoarthritis by mouth three time daily    . spironolactone (ALDACTONE) 25 MG tablet TAKE 1 TABLET(25 MG) BY MOUTH DAILY 90 tablet 1  . triamcinolone cream (KENALOG) 0.1 % APPLY CREAM EXTERNALLY TO AFFECTED AREA TWICE DAILY AS NEEDED (NOT FACE GROIN OR UNDERARMS) 30 g 1  . vitamin C (ASCORBIC ACID) 500 MG tablet Take 500 mg by mouth daily.     No facility-administered medications prior to visit.     Allergies:   [name] [ethacrynic acid], Furosemide, Sulfa antibiotics, Tape, [name], and Torsemide   Social History   Socioeconomic History  . Marital status: Married    Spouse name: Not on file  . Number of children: 1  . Years of education: Not on file  . Highest education level: Not on file  Occupational History  . Occupation: Retired    Fish farm manager: [employer]  Tobacco Use  . Smoking status: Never Smoker  . Smokeless  tobacco: Never Used  Substance and Sexual Activity  . Alcohol use: No  . Drug use: No  . Sexual activity: Never    Birth control/protection: Abstinence  Other Topics Concern  . Not on file  Social History Narrative   Lives in [place], [place]. She got married in 1948.   Social Determinants of Health   Financial Resource Strain:   . Difficulty of Paying Living Expenses:   Food Insecurity:   . Worried About Charity fundraiser in the Last Year:   . Arboriculturist in the Last Year:   Transportation Needs:   . Film/video editor (Medical):   Marland Kitchen Lack of Transportation (Non-Medical):   Physical Activity:   . Days of Exercise per Week:   . Minutes of Exercise per Session:   Stress:   . Feeling of Stress :   Social Connections:   . Frequency of Communication with Friends and Family:   . Frequency of Social Gatherings with Friends and Family:   . Attends Religious Services:   . Active Member of Clubs or Organizations:   . Attends Archivist Meetings:   Marland Kitchen Marital Status:      Family History:  The patient's family history includes Alcohol abuse in her child; Cancer in her son; Cancer (age of onset: 52) in her mother; Heart attack in her father; Heart disease in her brother, brother, and father; Heart disease (age of onset: 21) in her mother; Heart failure in her mother; Hypertension in her brother, father, mother, and sister; Hypertension (age of onset: 90) in her child; Lung disease in her brother; Stroke (age of onset: 44) in her mother.   ROS:   Please see the history of present illness.    ROS All other systems are reviewed and are negative.   PHYSICAL EXAM:   VS:  BP 118/68   Pulse 65   Ht 5\' 3"  (1.6 m)   Wt 121 lb (54.9 kg)   SpO2 95%   BMI 21.43 kg/m      General: Alert, oriented x3, no distress, smiling and comfortable Head: Flat ecchymosis left side of forehead, otherwise no evidence of trauma, PERRL, EOMI, no exophtalmos or lid  lag, no myxedema, no  xanthelasma; normal ears, nose and oropharynx Neck: normal jugular venous pulsations and no hepatojugular reflux; brisk carotid pulses without delay and no carotid bruits Chest: clear to auscultation, no signs of consolidation by percussion or palpation, normal fremitus, symmetrical and full respiratory excursions Cardiovascular: normal position and quality of the apical impulse, irregular rhythm, normal first and paradoxically split second heart sounds, grade 2/6 early peaking systolic ejection murmur, no diastolic murmurs, rubs or gallops Abdomen: no tenderness or distention, no masses by palpation, no abnormal pulsatility or arterial bruits, normal bowel sounds, no hepatosplenomegaly Extremities: no clubbing, cyanosis or edema (wearing compression stockings); 2+ radial, ulnar and brachial pulses bilaterally; 2+ right femoral, posterior tibial and dorsalis pedis pulses; 2+ left femoral, posterior tibial and dorsalis pedis pulses; no subclavian or femoral bruits Neurological: grossly nonfocal Psych: Normal mood and affect  Wt Readings from Last 3 Encounters:  11/03/19 121 lb (54.9 kg)  08/03/19 120 lb (54.4 kg)  07/21/19 123 lb (55.8 kg)      Studies/Labs Reviewed:   EKG:  EKG is ordered today.  It appears unchanged from previous tracings.  This shows atypical atrial flutter with controlled ventricular rate, left bundle branch block with left axis deviation (QRS 136 ms, QTC 476 ms).  Recent Labs: 05/30/2019: B Natriuretic Peptide 345.4 05/31/2019: Magnesium 2.3 06/03/2019: ALT 15; Hemoglobin 12.3; Platelets 311 06/14/2019: BUN 32; Creatinine, Ser 1.44; Potassium 4.7; Sodium 141   Lipid Panel    Component Value Date/Time   CHOL 120 01/20/2019 0956   TRIG 92 01/20/2019 0956   HDL 42 01/20/2019 0956   CHOLHDL 2.9 01/20/2019 0956   CHOLHDL 3.9 05/10/2016 0958   VLDL 28 05/10/2016 0958   LDLCALC 60 01/20/2019 0956   ASSESSMENT:    1. Nonrheumatic aortic valve stenosis   2. Chronic  diastolic heart failure (HCC)   3. Permanent atrial fibrillation ([place])   4. Long term current use of anticoagulant therapy   5. PAH (pulmonary artery hypertension) ([place])   6. Coronary artery disease involving native coronary artery of native heart without angina pectoris   7. Hypercholesterolemia   8. Essential hypertension      PLAN:  In order of problems listed above:  1. CHF: She has preserved left ventricular systolic function and has had occasional episodes of heart failure exacerbation due to diastolic dysfunction.  Her last hospitalization was in January 2021 and she has only required occasional dosing of oral bumetanide about once every 3 to 4 weeks since then.  Maintains NYHA functional class II. No overt hypervolemia by exam today.  Dry weight appears to be less than 122 pounds or so.  It is possible that she has TTR amyloidosis, but due to her advanced age and limited therapeutic options we have not explored this any further.   2. AFib:  Atypical atrial flutter.  Well rate controlled on a low-dose of metoprolol, which we may need to decrease further if he develops symptoms of bradycardia.  She is appropriately anticoagulated for CHADSVasc 6 (age 37, gender, HTN, CHF, CAD). 3. Eliquis: Asked her to report any head impact to her family or physicians promptly.  She has not had falls.  She has easy bruising.  The dose of anticoagulant was adjusted for her age and low body mass around 120 pounds. 4. AS: Murmur appears unchanged on exam and she is asymptomatic.  Moderate by her most recent echocardiogram performed in May 2018 with a mean gradient of 23 mmHg and in December  2019 with a mean gradient of 18 mmHg.  Schedule for an echocardiogram for the end of this year. 5. PAH: Presumably this is related to diastolic left heart failure. 6. CAD: No need for revascularization and asymptomatic for over 10 years.  On statin and beta-blocker.  Not on aspirin due to anticoagulation.. 7. HLP: Most  recent LDL cholesterol less than 70 8. HTN: Excellent control 9. 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 Lindi Adie).    Medication Adjustments/Labs and Tests Ordered: Current medicines are reviewed at length with the patient today.  Concerns regarding medicines are outlined above.  Medication changes, Labs and Tests ordered today are listed in the Patient Instructions below. Patient Instructions  Medication Instructions:  Continue current medications  *If you need a refill on your cardiac medications before your next appointment, please call your pharmacy*   Lab Work: None ordered   Testing/Procedures: Your physician has requested that you have an echocardiogram. Echocardiography is a painless test that uses sound waves to create images of your heart. It provides your doctor with information about the size and shape of your heart and how well your heart's chambers and valves are working. This procedure takes approximately one hour. There are no restrictions for this procedure.   Follow-Up: At [facility], you and your health needs are our priority.  As part of our continuing mission to provide you with exceptional heart care, we have created designated Provider Care Teams.  These Care Teams include your primary Cardiologist (physician) and Advanced Practice Providers (APPs -  Physician Assistants and Nurse Practitioners) who all work together to provide you with the care you need, when you need it.  We recommend signing up for the patient portal called "MyChart".  Sign up information is provided on this After Visit Summary.  MyChart is used to connect with patients for Virtual Visits (Telemedicine).  Patients are able to view lab/test results, encounter notes, upcoming appointments, etc.  Non-urgent messages can be sent to your provider as well.   To learn more about what you can do with MyChart, go to  [email or web address].    Your next appointment:   6 month(s)  The format for your next appointment:   In Person  Provider:   You may see Sanda Klein, MD or one of the following Advanced Practice Providers on your designated Care Team:    Almyra Deforest, PA-C  Fabian Sharp, [place] or   Roby Lofts, PA-C         Signed, Sanda Klein, MD  11/03/2019 9:09 AM    [facility] [address], [place], [place]  [address] Phone: [phone]; Fax: [phone]

## 2019-11-04 NOTE — Addendum Note (Signed)
Addended by: Wonda Horner on: 11/04/2019 08:33 AM   Modules accepted: Orders

## 2019-11-22 ENCOUNTER — Encounter (INDEPENDENT_AMBULATORY_CARE_PROVIDER_SITE_OTHER): Payer: Self-pay | Admitting: Ophthalmology

## 2019-11-22 ENCOUNTER — Other Ambulatory Visit: Payer: Self-pay

## 2019-11-22 ENCOUNTER — Ambulatory Visit (INDEPENDENT_AMBULATORY_CARE_PROVIDER_SITE_OTHER): Payer: Medicare Other | Admitting: Ophthalmology

## 2019-11-22 DIAGNOSIS — H353134 Nonexudative age-related macular degeneration, bilateral, advanced atrophic with subfoveal involvement: Secondary | ICD-10-CM

## 2019-11-22 NOTE — Progress Notes (Signed)
11/22/2019     CHIEF COMPLAINT Patient presents for Retina Follow Up   HISTORY OF PRESENT ILLNESS: [name] is a 86 y.o. female who presents to the clinic today for:   HPI    Retina Follow Up    Patient presents with  Dry AMD.  In both eyes.  Duration of 1 year.  Since onset it is stable.          Comments    1 year follow up - OCT OU Patient states that she thinks she has had a decrease in vision in the past year. Patient states that she has trouble reading letters people write her.       Last edited by Gerda Diss on 11/22/2019  9:10 AM. (History)      Referring physician: [name], [address] [place],  [place] [address]  HISTORICAL INFORMATION:   Selected notes from the MEDICAL RECORD NUMBER       CURRENT MEDICATIONS: No current outpatient medications on file. (Ophthalmic Drugs)   No current facility-administered medications for this visit. (Ophthalmic Drugs)   Current Outpatient Medications (Other)  Medication Sig  . acetaminophen (TYLENOL) 650 MG CR tablet Take 650 mg by mouth 3 (three) times daily. Morning, supper and bedtime  . apixaban (ELIQUIS) 2.5 MG TABS tablet Take 1 tablet (2.5 mg total) by mouth 2 (two) times daily.  Marland Kitchen atorvastatin (LIPITOR) 40 MG tablet TAKE 1 TABLET DAILY AT 6 P.M. (Patient taking differently: Take 40 mg by mouth daily at 6 PM. )  . augmented betamethasone dipropionate (DIPROLENE-AF) 0.05 % cream Apply 1 application topically daily.   . bumetanide (BUMEX) 1 MG tablet Take 1 tablet (1 mg total) by mouth daily as needed (for wt gain of 2 pounds in a day or 5 pounds in a week, swelling or shortness of breath).  . Cholecalciferol (VITAMIN D) 50 MCG (2000 UT) tablet Take 2,000 Units by mouth daily.  . diphenhydramine-acetaminophen (ACETAMINOPHEN PM) 25-500 MG TABS tablet Take 1 tablet by mouth at bedtime as needed.  . ezetimibe (ZETIA) 10 MG tablet TAKE 1 TABLET(10 MG) BY MOUTH DAILY  . folic acid (FOLVITE) 1 MG  tablet TAKE 2 TABLETS(2 MG) BY MOUTH DAILY  . ipratropium (ATROVENT) 0.03 % nasal spray Place 2 sprays into both nostrils 3 (three) times daily as needed for rhinitis.  Marland Kitchen levothyroxine (SYNTHROID) 88 MCG tablet Take 1 tablet (88 mcg total) by mouth daily before breakfast.  . loratadine (CLARITIN) 10 MG tablet Take 10 mg by mouth daily.  . magnesium hydroxide (MILK OF MAGNESIA) 400 MG/5ML suspension Take 15 mLs by mouth daily as needed for mild constipation.  . metoprolol tartrate (LOPRESSOR) 25 MG tablet Take 1 tablet (25 mg total) by mouth 2 (two) times daily.  . multivitamin-iron-minerals-folic acid (CENTRUM) chewable tablet Chew 1 tablet by mouth daily.  Marland Kitchen OVER THE COUNTER MEDICATION Take 1 tablet by mouth See admin instructions. Stool softener over the counter by mouth daily  Fiber  . OVER THE COUNTER MEDICATION Take 1 tablet by mouth See admin instructions. Over the counter pill for osteoarthritis by mouth three time daily  . spironolactone (ALDACTONE) 25 MG tablet TAKE 1 TABLET(25 MG) BY MOUTH DAILY  . triamcinolone cream (KENALOG) 0.1 % APPLY CREAM EXTERNALLY TO AFFECTED AREA TWICE DAILY AS NEEDED (NOT FACE GROIN OR UNDERARMS)  . vitamin C (ASCORBIC ACID) 500 MG tablet Take 500 mg by mouth daily.   No current facility-administered medications for this visit. (  Other)      REVIEW OF SYSTEMS:    ALLERGIES Allergies  Allergen Reactions  . [name] [Ethacrynic Acid] Nausea And Vomiting  . Furosemide Rash    Tolerates Bumex  . Sulfa Antibiotics Itching and Rash  . Tape Rash and Other (See Comments)    Tears skin off.  Please use "paper" tape only.   . [name] Rash    Per Dr Sallyanne Kuster, had a skin reaction with thiazides prior to use of Lasix  . Torsemide Rash    Tolerates Bumex    PAST MEDICAL HISTORY Past Medical History:  Diagnosis Date  .  Acute respiratiory failure requiring BiPap 06/24/2011  . Allergy   . Aortic valvular stenosis, moderate to severe  06/24/2011   a. 10/2012 Echo: mod AS, Valve 1.25 cm^2 (VTI), 1.19cm^2 (Vmax).  . Arthritis    "back" (10/30/2012)  . Chronic diastolic CHF (congestive heart failure) ([place])    a. 10/2012 Echo: EF 55-60%, no rwma, Gr 2 DD, moderate AS, mod dil LA, mildly to mod dil RA.  Marland Kitchen Coronary artery disease    a. 03/2009 PCI RCA (3.0x18 BMS).  . Dyslipidemia   . Exertional shortness of breath   . Hypertension   . Hypothyroidism   . PAF (paroxysmal atrial fibrillation) ([place])    a. CHA2DS2VASc = 7-->chronic coumadin;  b. s/p DCCV 05/2011, 05/2012, 07/2014;  c. On amio.  . Rash    a. felt to be 2/2 lasix/torsemide in setting of sulfa allergy (lasix d/c ~ 06/2014, torsemide d/c 08/09/2014).   Past Surgical History:  Procedure Laterality Date  . ANGIOPLASTY    . APPENDECTOMY    . BACK SURGERY    . CARDIAC CATHETERIZATION  10/06/2009   Continue medical therapy  . CARDIAC CATHETERIZATION  04/07/2009   RCA stented with a 3x55mm stent resulting in a reduction of 90% narrowing to normal  . CARDIOVERSION  06/19/2011   Procedure: CARDIOVERSION;  Surgeon: Sanda Klein, MD;  Location: [facility];  Service: Cardiovascular;  Laterality: N/A;  . CARDIOVERSION  07/11/2010   Successful coversion to sinus rhythm  . CARDIOVERSION N/A 08/10/2014   Procedure: CARDIOVERSION;  Surgeon: Sanda Klein, MD;  Location: [facility];  Service: Cardiovascular;  Laterality: N/A;  . CATARACT EXTRACTION W/ INTRAOCULAR LENS  IMPLANT, BILATERAL    . CORONARY ANGIOPLASTY WITH STENT PLACEMENT     "1" (10/30/2012)  . DILATION AND CURETTAGE OF UTERUS    . EXCISIONAL HEMORRHOIDECTOMY    . FRACTURE SURGERY    . HIP ARTHROPLASTY Right 04/17/2015   Procedure: HIP MONOPOLAR HIP HEMI ARTHROPLASTY;  Surgeon: Marybelle Killings, MD;  Location: [facility];  Service: Orthopedics;  Laterality: Right;  . IR THORACENTESIS ASP PLEURAL SPACE W/IMG GUIDE  04/28/2018  . IR THORACENTESIS ASP PLEURAL SPACE W/IMG GUIDE  05/26/2018  . LUMBAR DISC SURGERY  X 2   "cleaned out  scar tissue"   . SHOULDER ACROMIOPLASTY Left    "fell; put a new ball in" (10/30/2012)  . WRIST FRACTURE SURGERY Left     FAMILY HISTORY Family History  Problem Relation Age of Onset  . Heart disease Brother   . Heart disease Brother   . Lung disease Brother   . Heart disease Mother 78  . Cancer Mother 74       Uterine  . Stroke Mother 56  . Heart failure Mother   . Hypertension Mother   . Heart disease Father   . Heart attack Father   . Hypertension Father   .  Hypertension Child 34  . Alcohol abuse Child   . Cancer Son   . Hypertension Sister   . Hypertension Brother     SOCIAL HISTORY Social History   Tobacco Use  . Smoking status: Never Smoker  . Smokeless tobacco: Never Used  Substance Use Topics  . Alcohol use: No  . Drug use: No         OPHTHALMIC EXAM:  Base Eye Exam    Visual Acuity (Snellen - Linear)      Right Left   Dist [place] 20/400 CF @ 1'   Dist ph [place] NI NI       Tonometry (Tonopen, 9:14 AM)      Right Left   Pressure 11 9       Pupils      Pupils Dark Light Shape React APD   Right PERRL 2 2 Round Minimal None   Left PERRL 2 2 Round Minimal None       Visual Fields (Counting fingers)      Left Right    Full Full       Extraocular Movement      Right Left    Full Full       Neuro/Psych    Oriented x3: Yes   Mood/Affect: Normal       Dilation    Both eyes: 1.0% Mydriacyl, 2.5% Phenylephrine @ 9:14 AM        Slit Lamp and Fundus Exam    External Exam      Right Left   External Normal Normal       Slit Lamp Exam      Right Left   Lids/Lashes Normal Normal   Conjunctiva/Sclera White and quiet White and quiet   Cornea Clear Clear   Anterior Chamber Deep and quiet Deep and quiet   Iris Round and reactive Round and reactive   Lens Posterior chamber intraocular lens Posterior chamber intraocular lens   Anterior Vitreous Normal Normal       Fundus Exam      Right Left   Posterior Vitreous Posterior vitreous detachment  Posterior vitreous detachment   Disc Normal Normal   C/D Ratio 0.6 0.6   Macula Hard drusen, no macular thickening, Retinal pigment epithelial atrophy - geographic Hard drusen, no macular thickening, Retinal pigment epithelial atrophy - geographic   Vessels Normal Normal   Periphery Normal Normal          IMAGING AND PROCEDURES  Imaging and Procedures for 11/22/19  OCT, Retina - OU - Both Eyes       Right Eye Quality was good. Scan locations included subfoveal. Central Foveal Thickness: 206. Progression has worsened. Findings include no IRF, no SRF, outer retinal atrophy, central retinal atrophy, subretinal scarring.   Left Eye Quality was good. Scan locations included subfoveal. Central Foveal Thickness: 213. Progression has worsened. Findings include no SRF, no IRF, outer retinal atrophy, central retinal atrophy, subretinal scarring.                 ASSESSMENT/PLAN:  Advanced nonexudative age-related macular degeneration of both eyes with subfoveal involvement The nature of dry age related macular degeneration was discussed with the patient as well as its possible conversion to wet. The results of the AREDS 2 study was discussed with the patient. A diet rich in dark leafy green vegetables was advised and specific recommendations were made regarding supplements with AREDS 2 formulation . Control of hypertension and serum cholesterol may slow the  disease. Smoking cessation is mandatory to slow the disease and diminish the risk of progressing to wet age related macular degeneration. The patient was instructed in the use of an [place] and was told to return immediately for any changes in the Grid. Stressed to the patient do not rub eyes      ICD-10-CM   1. Advanced nonexudative age-related macular degeneration of both eyes with subfoveal involvement  H35.3134 OCT, Retina - OU - Both Eyes    1.  2.  3.  Ophthalmic Meds Ordered this visit:  No orders of the defined  types were placed in this encounter.      Return in about 1 year (around 11/21/2020) for DILATE OU, OCT, COLOR FP.  There are no Patient Instructions on file for this visit.   Explained the diagnoses, plan, and follow up with the patient and they expressed understanding.  Patient expressed understanding of the importance of proper follow up care.   Clent Demark Maurianna Benard M.D. Diseases & Surgery of the Retina and Vitreous [facility] [place] 11/22/19     Abbreviations: M myopia (nearsighted); A astigmatism; H hyperopia (farsighted); P presbyopia; Mrx spectacle prescription;  CTL contact lenses; OD right eye; OS left eye; OU both eyes  XT exotropia; ET esotropia; PEK punctate epithelial keratitis; PEE punctate epithelial erosions; DES dry eye syndrome; MGD meibomian gland dysfunction; ATs artificial tears; PFAT's preservative free artificial tears; [place] nuclear sclerotic cataract; PSC posterior subcapsular cataract; ERM epi-retinal membrane; PVD posterior vitreous detachment; RD retinal detachment; DM diabetes mellitus; DR diabetic retinopathy; NPDR non-proliferative diabetic retinopathy; PDR proliferative diabetic retinopathy; CSME clinically significant macular edema; DME diabetic macular edema; dbh dot blot hemorrhages; CWS cotton wool spot; POAG primary open angle glaucoma; C/D cup-to-disc ratio; HVF humphrey visual field; GVF goldmann visual field; OCT optical coherence tomography; IOP intraocular pressure; BRVO Branch retinal vein occlusion; CRVO central retinal vein occlusion; CRAO central retinal artery occlusion; BRAO branch retinal artery occlusion; RT retinal tear; SB scleral buckle; PPV pars plana vitrectomy; VH Vitreous hemorrhage; PRP panretinal laser photocoagulation; IVK intravitreal kenalog; VMT vitreomacular traction; MH Macular hole;  NVD neovascularization of the disc; NVE neovascularization elsewhere; AREDS age related eye disease study; ARMD age related macular degeneration;  POAG primary open angle glaucoma; EBMD epithelial/anterior basement membrane dystrophy; ACIOL anterior chamber intraocular lens; IOL intraocular lens; PCIOL posterior chamber intraocular lens; Phaco/IOL phacoemulsification with intraocular lens placement; [place] photorefractive keratectomy; LASIK laser assisted in situ keratomileusis; HTN hypertension; DM diabetes mellitus; COPD chronic obstructive pulmonary disease

## 2019-11-22 NOTE — Assessment & Plan Note (Signed)

## 2019-11-24 ENCOUNTER — Other Ambulatory Visit: Payer: Self-pay | Admitting: Family Medicine

## 2019-12-01 ENCOUNTER — Encounter: Payer: Self-pay | Admitting: Family Medicine

## 2019-12-01 ENCOUNTER — Other Ambulatory Visit: Payer: Self-pay

## 2019-12-01 ENCOUNTER — Ambulatory Visit (INDEPENDENT_AMBULATORY_CARE_PROVIDER_SITE_OTHER): Payer: Medicare Other | Admitting: Family Medicine

## 2019-12-01 VITALS — BP 110/66 | HR 62 | Temp 97.1°F | Wt 118.0 lb

## 2019-12-01 DIAGNOSIS — E049 Nontoxic goiter, unspecified: Secondary | ICD-10-CM

## 2019-12-01 DIAGNOSIS — E039 Hypothyroidism, unspecified: Secondary | ICD-10-CM | POA: Diagnosis not present

## 2019-12-01 NOTE — Progress Notes (Signed)
   Subjective:    Patient ID: [name], female    DOB: [birth year], 86 y.o.   MRN: [id number]  HPI She is here for a check on her thyroid.  She has been taking thyroid medicine for quite some time.  She has no skin or hair changes, cold intolerance.  Her last blood work was in 2019.   Review of Systems     Objective:   Physical Exam Alert and in no distress.  Cardiac exam shows a regular rhythm.  Lungs are clear to auscultation.  Skin is normal. DTRs normal       Assessment & Plan:  Hypothyroidism, goitrous - Plan: TSH

## 2019-12-02 ENCOUNTER — Telehealth: Payer: Self-pay

## 2019-12-02 LAB — TSH: TSH: 0.111 u[IU]/mL — ABNORMAL LOW (ref 0.450–4.500)

## 2019-12-02 MED ORDER — LEVOTHYROXINE SODIUM 75 MCG PO TABS: 75.0000 ug | ORAL_TABLET | Freq: Every day | ORAL | 3 refills | Status: DC

## 2019-12-02 NOTE — Telephone Encounter (Signed)
VM for pt to call back for lab results [facility] Xxx

## 2019-12-02 NOTE — Addendum Note (Signed)
Addended by: [name] on: 12/02/2019 08:09 AM   Modules accepted: Orders

## 2019-12-03 ENCOUNTER — Other Ambulatory Visit: Payer: Self-pay

## 2019-12-03 MED ORDER — LEVOTHYROXINE SODIUM 75 MCG PO TABS: 75.0000 ug | ORAL_TABLET | Freq: Every day | ORAL | 3 refills | Status: DC

## 2019-12-13 ENCOUNTER — Telehealth: Payer: Self-pay | Admitting: Cardiovascular Disease

## 2019-12-13 NOTE — Telephone Encounter (Signed)
Forward to  Anticoag  pool - [employer]

## 2019-12-13 NOTE — Telephone Encounter (Signed)
Reviewed information with Dr. Sallyanne Kuster.  Pt 86 yrs old CHADS2-VASc score of 6 (no DM or stroke).  Has been bumping into cabinet doors, doorways more frequently - almost blind d/t macular degeneration.  Currently has several bruises on head, this morning son noticed blood on her lips/gums.    Dr. Loletha Grayer okay to d/c Eliquis if the family feels it would be best.  Reviewed with son, discussed risk/benefit issues related to bleeding vs stroke.  He will stop Eliquis for 3 days to see if her bruising clears up (or doesn't get worse).  Also scheduled her to see PA in next 2-3 weeks for evaluation.

## 2019-12-13 NOTE — Telephone Encounter (Signed)
     Pt c/o medication issue:  1. Name of Medication:   apixaban (ELIQUIS) 2.5 MG TABS tablet    2. How are you currently taking this medication (dosage and times per day)?   3. Are you having a reaction (difficulty breathing--STAT)?   4. What is your medication issue? Pt's son called, he said pt is having bleeding issue. He said it started on the pt's bruise on her head and this morning pt had blood came out from her mouth and dripped on her shirt.

## 2019-12-14 ENCOUNTER — Ambulatory Visit (INDEPENDENT_AMBULATORY_CARE_PROVIDER_SITE_OTHER): Payer: Medicare Other | Admitting: Adult Health

## 2019-12-14 ENCOUNTER — Other Ambulatory Visit: Payer: Self-pay

## 2019-12-14 ENCOUNTER — Encounter: Payer: Self-pay | Admitting: Adult Health

## 2019-12-14 VITALS — BP 100/78 | HR 71 | Temp 97.3°F | Ht 60.0 in | Wt 115.2 lb

## 2019-12-14 DIAGNOSIS — I4811 Longstanding persistent atrial fibrillation: Secondary | ICD-10-CM | POA: Diagnosis not present

## 2019-12-14 DIAGNOSIS — E7800 Pure hypercholesterolemia, unspecified: Secondary | ICD-10-CM

## 2019-12-14 DIAGNOSIS — Z79899 Other long term (current) drug therapy: Secondary | ICD-10-CM

## 2019-12-14 DIAGNOSIS — I10 Essential (primary) hypertension: Secondary | ICD-10-CM | POA: Diagnosis not present

## 2019-12-14 DIAGNOSIS — T148XXA Other injury of unspecified body region, initial encounter: Secondary | ICD-10-CM | POA: Diagnosis not present

## 2019-12-14 NOTE — Progress Notes (Signed)
Cardiology Office Note   Date:  12/14/2019   ID:  [name], DOB 09/11/1923, MRN [id number]  PCP:  [name], MD  Cardiologist:  Dr.Croitoru  No chief complaint on file.    History of Present Illness: [name] is a 86 y.o. female who presents for ongoing assessment and management of chronic diastolic heart failure, coronary artery disease, moderate aortic stenosis, persistent atrial fibrillation and hyperlipidemia.   She is here today at the request of her son, as she has been having a large cystlike eruptions on her scalp and forehead.  This is been occurring over the last couple of weeks.  She is feeling weaker and less energetic.  She denies shortness of breath, dizziness, or traumatic falls that she is aware of.  The lesions have not gotten any better and are increasing in number on her scalp.   She denies falls, headache, worsening vision (she does have macular degeneration), or chest pain.  Her son states that she had some bleeding from her mouth earlier this week there was dripping onto her shirt which she did not notice.  Past Medical History:  Diagnosis Date  .  Acute respiratiory failure requiring BiPap 06/24/2011  . Allergy   . Aortic valvular stenosis, moderate to severe 06/24/2011   a. 10/2012 Echo: mod AS, Valve 1.25 cm^2 (VTI), 1.19cm^2 (Vmax).  . Arthritis    "back" (10/30/2012)  . Chronic diastolic CHF (congestive heart failure) ([place])    a. 10/2012 Echo: EF 55-60%, no rwma, Gr 2 DD, moderate AS, mod dil LA, mildly to mod dil RA.  Marland Kitchen Coronary artery disease    a. 03/2009 PCI RCA (3.0x18 BMS).  . Dyslipidemia   . Exertional shortness of breath   . Hypertension   . Hypothyroidism   . PAF (paroxysmal atrial fibrillation) ([place])    a. CHA2DS2VASc = 7-->chronic coumadin;  b. s/p DCCV 05/2011, 05/2012, 07/2014;  c. On amio.  . Rash    a. felt to be 2/2 lasix/torsemide in setting of sulfa allergy (lasix d/c ~ 06/2014, torsemide d/c 08/09/2014).    Past Surgical  History:  Procedure Laterality Date  . ANGIOPLASTY    . APPENDECTOMY    . BACK SURGERY    . CARDIAC CATHETERIZATION  10/06/2009   Continue medical therapy  . CARDIAC CATHETERIZATION  04/07/2009   RCA stented with a 3x10mm stent resulting in a reduction of 90% narrowing to normal  . CARDIOVERSION  06/19/2011   Procedure: CARDIOVERSION;  Surgeon: Sanda Klein, MD;  Location: [facility];  Service: Cardiovascular;  Laterality: N/A;  . CARDIOVERSION  07/11/2010   Successful coversion to sinus rhythm  . CARDIOVERSION N/A 08/10/2014   Procedure: CARDIOVERSION;  Surgeon: Sanda Klein, MD;  Location: [facility];  Service: Cardiovascular;  Laterality: N/A;  . CATARACT EXTRACTION W/ INTRAOCULAR LENS  IMPLANT, BILATERAL    . CORONARY ANGIOPLASTY WITH STENT PLACEMENT     "1" (10/30/2012)  . DILATION AND CURETTAGE OF UTERUS    . EXCISIONAL HEMORRHOIDECTOMY    . FRACTURE SURGERY    . HIP ARTHROPLASTY Right 04/17/2015   Procedure: HIP MONOPOLAR HIP HEMI ARTHROPLASTY;  Surgeon: Marybelle Killings, MD;  Location: [facility];  Service: Orthopedics;  Laterality: Right;  . IR THORACENTESIS ASP PLEURAL SPACE W/IMG GUIDE  04/28/2018  . IR THORACENTESIS ASP PLEURAL SPACE W/IMG GUIDE  05/26/2018  . LUMBAR DISC SURGERY  X 2   "cleaned out scar tissue"   . SHOULDER ACROMIOPLASTY Left    "fell;  put a new ball in" (10/30/2012)  . WRIST FRACTURE SURGERY Left      Current Outpatient Medications  Medication Sig Dispense Refill  . acetaminophen (TYLENOL) 650 MG CR tablet Take 650 mg by mouth 3 (three) times daily. Morning, supper and bedtime    . apixaban (ELIQUIS) 2.5 MG TABS tablet Take 1 tablet (2.5 mg total) by mouth 2 (two) times daily. 180 tablet 1  . atorvastatin (LIPITOR) 40 MG tablet TAKE 1 TABLET DAILY AT 6 P.M. (Patient taking differently: Take 40 mg by mouth daily at 6 PM. ) 90 tablet 3  . augmented betamethasone dipropionate (DIPROLENE-AF) 0.05 % cream Apply 1 application topically daily.     . bumetanide (BUMEX) 1  MG tablet Take 1 tablet (1 mg total) by mouth daily as needed (for wt gain of 2 pounds in a day or 5 pounds in a week, swelling or shortness of breath). 30 tablet 11  . Cholecalciferol (VITAMIN D) 50 MCG (2000 UT) tablet Take 2,000 Units by mouth daily.    . diphenhydramine-acetaminophen (ACETAMINOPHEN PM) 25-500 MG TABS tablet Take 1 tablet by mouth at bedtime as needed.    . ezetimibe (ZETIA) 10 MG tablet TAKE 1 TABLET(10 MG) BY MOUTH DAILY 90 tablet 1  . folic acid (FOLVITE) 1 MG tablet TAKE 2 TABLETS(2 MG) BY MOUTH DAILY 90 tablet 1  . ipratropium (ATROVENT) 0.03 % nasal spray Place 2 sprays into both nostrils 3 (three) times daily as needed for rhinitis. 30 mL 0  . levothyroxine (SYNTHROID) 75 MCG tablet Take 1 tablet (75 mcg total) by mouth daily. 90 tablet 3  . loratadine (CLARITIN) 10 MG tablet Take 10 mg by mouth daily.    . magnesium hydroxide (MILK OF MAGNESIA) 400 MG/5ML suspension Take 15 mLs by mouth daily as needed for mild constipation. 360 mL 0  . metoprolol tartrate (LOPRESSOR) 25 MG tablet Take 1 tablet (25 mg total) by mouth 2 (two) times daily. 180 tablet 2  . multivitamin-iron-minerals-folic acid (CENTRUM) chewable tablet Chew 1 tablet by mouth daily.    Marland Kitchen OVER THE COUNTER MEDICATION Take 1 tablet by mouth See admin instructions. Stool softener over the counter by mouth daily  Fiber    . OVER THE COUNTER MEDICATION Take 1 tablet by mouth See admin instructions. Over the counter pill for osteoarthritis by mouth three time daily    . spironolactone (ALDACTONE) 25 MG tablet TAKE 1 TABLET(25 MG) BY MOUTH DAILY 90 tablet 1  . triamcinolone cream (KENALOG) 0.1 % APPLY CREAM EXTERNALLY TO AFFECTED AREA TWICE DAILY AS NEEDED (NOT FACE GROIN OR UNDERARMS) 30 g 1  . vitamin C (ASCORBIC ACID) 500 MG tablet Take 500 mg by mouth daily.     No current facility-administered medications for this visit.    Allergies:   [name] [ethacrynic acid], Furosemide, Sulfa antibiotics, Tape,  [name], and Torsemide    Social History:  The patient  reports that she has never smoked. She has never used smokeless tobacco. She reports that she does not drink alcohol and does not use drugs.   Family History:  The patient's family history includes Alcohol abuse in her child; Cancer in her son; Cancer (age of onset: 41) in her mother; Heart attack in her father; Heart disease in her brother, brother, and father; Heart disease (age of onset: 41) in her mother; Heart failure in her mother; Hypertension in her brother, father, mother, and sister; Hypertension (age of onset: 103) in her child; Lung disease in  her brother; Stroke (age of onset: 58) in her mother.    ROS: All other systems are reviewed and negative. Unless otherwise mentioned in H&P    PHYSICAL EXAM: VS:  BP 100/78   Pulse 71   Temp (!) 97.3 F (36.3 C)   Ht 5' (1.524 m)   Wt 115 lb 3.2 oz (52.3 kg)   SpO2 98%   BMI 22.50 kg/m  , BMI Body mass index is 22.5 kg/m. GEN: Well nourished, well developed, in no acute distress HEENT: Abnormal.  Several cyst like eruptions on her scalp, and forehead and right temple.  They are discolored bluish-black.  Some sponginess in some and others more firm.  (I have taken photo which she will find in media) nonpainful to palpation Neck: no JVD, carotid bruits, or masses Cardiac: IRRR; no murmurs, rubs, or gallops,no edema  Respiratory:  Clear to auscultation bilaterally, normal work of breathing GI: soft, nontender, nondistended, + BS MS: no deformity or atrophy Skin: warm and dry, no rash, mild ecchymosis noted on her forearms. Neuro:  Strength and sensation are intact.  Hard of hearing Psych: euthymic mood, full affect   EKG: Not completed this office visit  Recent Labs: 05/30/2019: B Natriuretic Peptide 345.4 05/31/2019: Magnesium 2.3 06/03/2019: ALT 15; Hemoglobin 12.3; Platelets 311 06/14/2019: BUN 32; Creatinine, Ser 1.44; Potassium 4.7; Sodium 141 12/01/2019: TSH  0.111    Lipid Panel    Component Value Date/Time   CHOL 120 01/20/2019 0956   TRIG 92 01/20/2019 0956   HDL 42 01/20/2019 0956   CHOLHDL 2.9 01/20/2019 0956   CHOLHDL 3.9 05/10/2016 0958   VLDL 28 05/10/2016 0958   LDLCALC 60 01/20/2019 0956      Wt Readings from Last 3 Encounters:  12/14/19 115 lb 3.2 oz (52.3 kg)  12/01/19 118 lb (53.5 kg)  11/03/19 121 lb (54.9 kg)      Other studies Reviewed: Echocardiogram May 08, 2018 Left ventricle: The cavity size was normal. Wall thickness was  increased in a pattern of mild LVH. Systolic function was normal.  The estimated ejection fraction was in the range of 55% to 60%.  Wall motion was normal; there were no regional wall motion  abnormalities. The study is not technically sufficient to allow  evaluation of LV diastolic function. Doppler parameters are  consistent with high ventricular filling pressure.  - Aortic valve: Valve mobility was restricted. There was mild to  moderate stenosis. There was trivial regurgitation.  - Mitral valve: Severely calcified annulus. Mildly thickened  leaflets .  - Left atrium: The atrium was moderately dilated.  - Pulmonary arteries: Systolic pressure was mildly increased. PA  peak pressure: 46 mm Hg (S).  - Pericardium, extracardiac: A small pericardial effusion was  identified. There was a left pleural effusion.     ASSESSMENT AND PLAN:  1.  Cystic lesions vs hematoma's on scalp: Uncertain etiology.  She denies falling, hitting or bumping her head, They are not painful, they are discolored,.Some more firm and somewhat spongy.  I have counted 6.  I have taken photographs for documentation in her chart.  I am getting go ahead and get a CAT scan of her head to make sure there is no issues of bleeding or any trauma.  In the meantime I will check a CBC and a BMET.  Will discuss further with Dr. Sallyanne Kuster once test results are available.  2.  Atrial fibrillation: We will  discontinue apixaban 2.5 mg twice daily in case there is some  subcutaneous bleeding onto her scalp causing possible small hematomas.  She will continue metoprolol 25 mg twice daily.  Blood pressure is soft.  May need to consider decreasing spironolactone to 12.5 mg daily from 25 mg daily once labs or evaluated to make sure she is not anemic first.  3.  Hyperlipidemia: Continue atorvastatin 40 mg daily along with estimated 10 mg daily.  Will need to have follow-up labs on next office visit.  Current medicines are reviewed at length with the patient today.  I have spent 35 minutes dedicated to the care of this patient on the date of this encounter to include pre-visit review of records, assessment, management and diagnostic testing,with shared decision making.  Labs/ tests ordered today include: CT Scan head w/o.  BMET and CBC.  Phill Myron. West Pugh, ANP, AACC   12/14/2019 3:10 PM    [facility] [place] [address] Office [phone] Fax [phone]  Notice: This dictation was prepared with Dragon dictation along with smaller phrase technology. Any transcriptional errors that result from this process are unintentional and may not be corrected upon review.

## 2019-12-14 NOTE — Patient Instructions (Addendum)
Medication Instructions:  STOP- Eliquis  *If you need a refill on your cardiac medications before your next appointment, please call your pharmacy*   Lab Work: CBC and BMP  If you have labs (blood work) drawn today and your tests are completely normal, you will receive your results only by: Marland Kitchen MyChart Message (if you have MyChart) OR . A paper copy in the mail If you have any lab test that is abnormal or we need to change your treatment, we will call you to review the results.   Testing/Procedures: Non-Cardiac CT scanning, (CAT scanning), is a noninvasive, special x-ray that produces cross-sectional images of the body using x-rays and a computer. CT scans help physicians diagnose and treat medical conditions. For some CT exams, a contrast material is used to enhance visibility in the area of the body being studied. CT scans provide greater clarity and reveal more details than regular x-ray exams.  Follow-Up: At [facility], you and your health needs are our priority.  As part of our continuing mission to provide you with exceptional heart care, we have created designated Provider Care Teams.  These Care Teams include your primary Cardiologist (physician) and Advanced Practice Providers (APPs -  Physician Assistants and Nurse Practitioners) who all work together to provide you with the care you need, when you need it.  We recommend signing up for the patient portal called "MyChart".  Sign up information is provided on this After Visit Summary.  MyChart is used to connect with patients for Virtual Visits (Telemedicine).  Patients are able to view lab/test results, encounter notes, upcoming appointments, etc.  Non-urgent messages can be sent to your provider as well.   To learn more about what you can do with MyChart, go to [email or web address].    Your next appointment:   1 month(s)  The format for your next appointment:   In Person  Provider:   You may see Sanda Klein, MD or one  of the following Advanced Practice Providers on your designated Care Team:    Almyra Deforest, PA-C  Fabian Sharp, PA-C or   Roby Lofts, [place]

## 2019-12-14 NOTE — Progress Notes (Signed)
Thanks. She sounds weaker and frailer every time.

## 2019-12-15 ENCOUNTER — Emergency Department (HOSPITAL_COMMUNITY): Payer: Medicare Other

## 2019-12-15 ENCOUNTER — Inpatient Hospital Stay (HOSPITAL_COMMUNITY): Payer: Medicare Other

## 2019-12-15 ENCOUNTER — Inpatient Hospital Stay (HOSPITAL_COMMUNITY)
Admission: EM | Admit: 2019-12-15 | Discharge: 2019-12-23 | DRG: 683 | Disposition: A | Discharge status: Skilled Nursing Facility | Payer: Medicare Other | Attending: Internal Medicine | Admitting: Internal Medicine

## 2019-12-15 DIAGNOSIS — C8590 Non-Hodgkin lymphoma, unspecified, unspecified site: Secondary | ICD-10-CM | POA: Diagnosis not present

## 2019-12-15 DIAGNOSIS — I2510 Atherosclerotic heart disease of native coronary artery without angina pectoris: Secondary | ICD-10-CM | POA: Diagnosis not present

## 2019-12-15 DIAGNOSIS — M2550 Pain in unspecified joint: Secondary | ICD-10-CM | POA: Diagnosis not present

## 2019-12-15 DIAGNOSIS — R918 Other nonspecific abnormal finding of lung field: Secondary | ICD-10-CM

## 2019-12-15 DIAGNOSIS — E875 Hyperkalemia: Secondary | ICD-10-CM | POA: Diagnosis not present

## 2019-12-15 DIAGNOSIS — Z809 Family history of malignant neoplasm, unspecified: Secondary | ICD-10-CM | POA: Diagnosis not present

## 2019-12-15 DIAGNOSIS — I6389 Other cerebral infarction: Secondary | ICD-10-CM | POA: Diagnosis not present

## 2019-12-15 DIAGNOSIS — Z888 Allergy status to other drugs, medicaments and biological substances status: Secondary | ICD-10-CM

## 2019-12-15 DIAGNOSIS — Z7189 Other specified counseling: Secondary | ICD-10-CM | POA: Diagnosis not present

## 2019-12-15 DIAGNOSIS — N1832 Chronic kidney disease, stage 3b: Secondary | ICD-10-CM | POA: Diagnosis present

## 2019-12-15 DIAGNOSIS — R531 Weakness: Secondary | ICD-10-CM | POA: Diagnosis not present

## 2019-12-15 DIAGNOSIS — N179 Acute kidney failure, unspecified: Principal | ICD-10-CM | POA: Diagnosis present

## 2019-12-15 DIAGNOSIS — Z20822 Contact with and (suspected) exposure to covid-19: Secondary | ICD-10-CM | POA: Diagnosis not present

## 2019-12-15 DIAGNOSIS — Z8249 Family history of ischemic heart disease and other diseases of the circulatory system: Secondary | ICD-10-CM | POA: Diagnosis not present

## 2019-12-15 DIAGNOSIS — Z955 Presence of coronary angioplasty implant and graft: Secondary | ICD-10-CM

## 2019-12-15 DIAGNOSIS — I350 Nonrheumatic aortic (valve) stenosis: Secondary | ICD-10-CM | POA: Diagnosis present

## 2019-12-15 DIAGNOSIS — J90 Pleural effusion, not elsewhere classified: Secondary | ICD-10-CM | POA: Diagnosis not present

## 2019-12-15 DIAGNOSIS — I4819 Other persistent atrial fibrillation: Secondary | ICD-10-CM | POA: Diagnosis present

## 2019-12-15 DIAGNOSIS — D649 Anemia, unspecified: Secondary | ICD-10-CM | POA: Diagnosis not present

## 2019-12-15 DIAGNOSIS — N189 Chronic kidney disease, unspecified: Secondary | ICD-10-CM | POA: Diagnosis present

## 2019-12-15 DIAGNOSIS — J811 Chronic pulmonary edema: Secondary | ICD-10-CM | POA: Diagnosis not present

## 2019-12-15 DIAGNOSIS — I959 Hypotension, unspecified: Secondary | ICD-10-CM | POA: Diagnosis not present

## 2019-12-15 DIAGNOSIS — I130 Hypertensive heart and chronic kidney disease with heart failure and stage 1 through stage 4 chronic kidney disease, or unspecified chronic kidney disease: Secondary | ICD-10-CM | POA: Diagnosis present

## 2019-12-15 DIAGNOSIS — Z882 Allergy status to sulfonamides status: Secondary | ICD-10-CM

## 2019-12-15 DIAGNOSIS — E039 Hypothyroidism, unspecified: Secondary | ICD-10-CM | POA: Diagnosis not present

## 2019-12-15 DIAGNOSIS — Z96641 Presence of right artificial hip joint: Secondary | ICD-10-CM | POA: Diagnosis not present

## 2019-12-15 DIAGNOSIS — I7090 Unspecified atherosclerosis: Secondary | ICD-10-CM | POA: Diagnosis not present

## 2019-12-15 DIAGNOSIS — I10 Essential (primary) hypertension: Secondary | ICD-10-CM | POA: Diagnosis not present

## 2019-12-15 DIAGNOSIS — E785 Hyperlipidemia, unspecified: Secondary | ICD-10-CM | POA: Diagnosis not present

## 2019-12-15 DIAGNOSIS — Z79890 Hormone replacement therapy (postmenopausal): Secondary | ICD-10-CM

## 2019-12-15 DIAGNOSIS — J189 Pneumonia, unspecified organism: Secondary | ICD-10-CM | POA: Diagnosis not present

## 2019-12-15 DIAGNOSIS — C3490 Malignant neoplasm of unspecified part of unspecified bronchus or lung: Secondary | ICD-10-CM | POA: Diagnosis present

## 2019-12-15 DIAGNOSIS — Z823 Family history of stroke: Secondary | ICD-10-CM | POA: Diagnosis not present

## 2019-12-15 DIAGNOSIS — E860 Dehydration: Secondary | ICD-10-CM | POA: Diagnosis present

## 2019-12-15 DIAGNOSIS — E049 Nontoxic goiter, unspecified: Secondary | ICD-10-CM | POA: Diagnosis not present

## 2019-12-15 DIAGNOSIS — M1990 Unspecified osteoarthritis, unspecified site: Secondary | ICD-10-CM | POA: Diagnosis not present

## 2019-12-15 DIAGNOSIS — Z9181 History of falling: Secondary | ICD-10-CM | POA: Diagnosis not present

## 2019-12-15 DIAGNOSIS — R404 Transient alteration of awareness: Secondary | ICD-10-CM | POA: Diagnosis not present

## 2019-12-15 DIAGNOSIS — K8020 Calculus of gallbladder without cholecystitis without obstruction: Secondary | ICD-10-CM | POA: Diagnosis not present

## 2019-12-15 DIAGNOSIS — Z79899 Other long term (current) drug therapy: Secondary | ICD-10-CM

## 2019-12-15 DIAGNOSIS — Z7401 Bed confinement status: Secondary | ICD-10-CM | POA: Diagnosis not present

## 2019-12-15 DIAGNOSIS — R6889 Other general symptoms and signs: Secondary | ICD-10-CM | POA: Diagnosis not present

## 2019-12-15 DIAGNOSIS — R1310 Dysphagia, unspecified: Secondary | ICD-10-CM | POA: Diagnosis not present

## 2019-12-15 DIAGNOSIS — Z515 Encounter for palliative care: Secondary | ICD-10-CM

## 2019-12-15 DIAGNOSIS — I5032 Chronic diastolic (congestive) heart failure: Secondary | ICD-10-CM | POA: Diagnosis present

## 2019-12-15 DIAGNOSIS — G9389 Other specified disorders of brain: Secondary | ICD-10-CM | POA: Diagnosis not present

## 2019-12-15 DIAGNOSIS — C799 Secondary malignant neoplasm of unspecified site: Secondary | ICD-10-CM

## 2019-12-15 DIAGNOSIS — Z743 Need for continuous supervision: Secondary | ICD-10-CM | POA: Diagnosis not present

## 2019-12-15 DIAGNOSIS — R911 Solitary pulmonary nodule: Secondary | ICD-10-CM | POA: Diagnosis not present

## 2019-12-15 DIAGNOSIS — Z7901 Long term (current) use of anticoagulants: Secondary | ICD-10-CM

## 2019-12-15 DIAGNOSIS — E782 Mixed hyperlipidemia: Secondary | ICD-10-CM | POA: Diagnosis not present

## 2019-12-15 DIAGNOSIS — Z66 Do not resuscitate: Secondary | ICD-10-CM

## 2019-12-15 DIAGNOSIS — R220 Localized swelling, mass and lump, head: Secondary | ICD-10-CM | POA: Diagnosis not present

## 2019-12-15 DIAGNOSIS — R0600 Dyspnea, unspecified: Secondary | ICD-10-CM | POA: Diagnosis not present

## 2019-12-15 LAB — MAGNESIUM: Magnesium: 2.6 mg/dL — ABNORMAL HIGH (ref 1.7–2.4)

## 2019-12-15 LAB — COMPREHENSIVE METABOLIC PANEL
ALT: 20 U/L (ref 0–44)
AST: 32 U/L (ref 15–41)
Albumin: 3.4 g/dL — ABNORMAL LOW (ref 3.5–5.0)
Alkaline Phosphatase: 99 U/L (ref 38–126)
Anion gap: 11 (ref 5–15)
BUN: 60 mg/dL — ABNORMAL HIGH (ref 8–23)
CO2: 25 mmol/L (ref 22–32)
Calcium: 14 mg/dL (ref 8.9–10.3)
Chloride: 105 mmol/L (ref 98–111)
Creatinine, Ser: 2.34 mg/dL — ABNORMAL HIGH (ref 0.44–1.00)
GFR calc Af Amer: 20 mL/min — ABNORMAL LOW (ref >60)
GFR calc non Af Amer: 17 mL/min — ABNORMAL LOW (ref >60)
Glucose, Bld: 97 mg/dL (ref 70–99)
Potassium: 4.1 mmol/L (ref 3.5–5.1)
Sodium: 141 mmol/L (ref 135–145)
Total Bilirubin: 1.8 mg/dL — ABNORMAL HIGH (ref 0.3–1.2)
Total Protein: 6.5 g/dL (ref 6.5–8.1)

## 2019-12-15 LAB — BASIC METABOLIC PANEL
BUN/Creatinine Ratio: 25 (ref 12–28)
BUN: 63 mg/dL — ABNORMAL HIGH (ref 10–36)
CO2: 26 mmol/L (ref 20–29)
Calcium: 13.2 mg/dL (ref 8.7–10.3)
Chloride: 104 mmol/L (ref 96–106)
Creatinine, Ser: 2.53 mg/dL — ABNORMAL HIGH (ref 0.57–1.00)
GFR calc Af Amer: 18 mL/min/{1.73_m2} — ABNORMAL LOW (ref >59)
GFR calc non Af Amer: 16 mL/min/{1.73_m2} — ABNORMAL LOW (ref >59)
Glucose: 110 mg/dL — ABNORMAL HIGH (ref 65–99)
Potassium: 4.7 mmol/L (ref 3.5–5.2)
Sodium: 142 mmol/L (ref 134–144)

## 2019-12-15 LAB — URINALYSIS, ROUTINE W REFLEX MICROSCOPIC
Bacteria, UA: NONE SEEN
Bilirubin Urine: NEGATIVE
Glucose, UA: NEGATIVE mg/dL
Hgb urine dipstick: NEGATIVE
Ketones, ur: NEGATIVE mg/dL
Leukocytes,Ua: NEGATIVE
Nitrite: NEGATIVE
Protein, ur: NEGATIVE mg/dL
Specific Gravity, Urine: 1.01 (ref 1.005–1.030)
pH: 7 (ref 5.0–8.0)

## 2019-12-15 LAB — CBC WITH DIFFERENTIAL/PLATELET
Abs Immature Granulocytes: 0 10*3/uL (ref 0.00–0.07)
Basophils Absolute: 0.2 10*3/uL — ABNORMAL HIGH (ref 0.0–0.1)
Basophils Relative: 3 %
Eosinophils Absolute: 0.2 10*3/uL (ref 0.0–0.5)
Eosinophils Relative: 3 %
HCT: 35.8 % — ABNORMAL LOW (ref 36.0–46.0)
Hemoglobin: 11 g/dL — ABNORMAL LOW (ref 12.0–15.0)
Lymphocytes Relative: 10 %
Lymphs Abs: 0.8 10*3/uL (ref 0.7–4.0)
MCH: 28.4 pg (ref 26.0–34.0)
MCHC: 30.7 g/dL (ref 30.0–36.0)
MCV: 92.5 fL (ref 80.0–100.0)
Monocytes Absolute: 0.3 10*3/uL (ref 0.1–1.0)
Monocytes Relative: 4 %
Neutro Abs: 6.3 10*3/uL (ref 1.7–7.7)
Neutrophils Relative %: 80 %
Platelets: 183 10*3/uL (ref 150–400)
RBC: 3.87 MIL/uL (ref 3.87–5.11)
RDW: 14.3 % (ref 11.5–15.5)
WBC: 7.9 10*3/uL (ref 4.0–10.5)
nRBC: 0 % (ref 0.0–0.2)
nRBC: 0 /100 WBC

## 2019-12-15 LAB — CBC
HCT: 36.7 % (ref 36.0–46.0)
Hematocrit: 34.1 % (ref 34.0–46.6)
Hemoglobin: 10.9 g/dL — ABNORMAL LOW (ref 11.1–15.9)
Hemoglobin: 11.2 g/dL — ABNORMAL LOW (ref 12.0–15.0)
MCH: 28.9 pg (ref 26.6–33.0)
MCH: 28.9 pg (ref 26.0–34.0)
MCHC: 32 g/dL (ref 31.5–35.7)
MCHC: 30.5 g/dL (ref 30.0–36.0)
MCV: 91 fL (ref 79–97)
MCV: 94.8 fL (ref 80.0–100.0)
Platelets: 177 10*3/uL (ref 150–450)
Platelets: 192 10*3/uL (ref 150–400)
RBC: 3.77 x10E6/uL (ref 3.77–5.28)
RBC: 3.87 MIL/uL (ref 3.87–5.11)
RDW: 13.1 % (ref 11.7–15.4)
RDW: 14.3 % (ref 11.5–15.5)
WBC: 7 10*3/uL (ref 3.4–10.8)
WBC: 7.4 10*3/uL (ref 4.0–10.5)
nRBC: 0 % (ref 0.0–0.2)

## 2019-12-15 LAB — CREATININE, SERUM
Creatinine, Ser: 2.35 mg/dL — ABNORMAL HIGH (ref 0.44–1.00)
GFR calc Af Amer: 20 mL/min — ABNORMAL LOW (ref >60)
GFR calc non Af Amer: 17 mL/min — ABNORMAL LOW (ref >60)

## 2019-12-15 LAB — VITAMIN D 25 HYDROXY (VIT D DEFICIENCY, FRACTURES): Vit D, 25-Hydroxy: 41.81 ng/mL (ref 30–100)

## 2019-12-15 LAB — TROPONIN I (HIGH SENSITIVITY)
Troponin I (High Sensitivity): 28 ng/L — ABNORMAL HIGH (ref <18)
Troponin I (High Sensitivity): 27 ng/L — ABNORMAL HIGH (ref <18)

## 2019-12-15 LAB — SARS CORONAVIRUS 2 BY RT PCR (HOSPITAL ORDER, PERFORMED IN ~~LOC~~ HOSPITAL LAB): SARS Coronavirus 2: NEGATIVE

## 2019-12-15 LAB — LACTIC ACID, PLASMA
Lactic Acid, Venous: 1.5 mmol/L (ref 0.5–1.9)
Lactic Acid, Venous: 1.4 mmol/L (ref 0.5–1.9)

## 2019-12-15 LAB — PROTIME-INR
INR: 1.2 (ref 0.8–1.2)
Prothrombin Time: 15.2 seconds (ref 11.4–15.2)

## 2019-12-15 LAB — PROCALCITONIN: Procalcitonin: <0.1 ng/mL

## 2019-12-15 LAB — TSH: TSH: 0.189 u[IU]/mL — ABNORMAL LOW (ref 0.350–4.500)

## 2019-12-15 LAB — PHOSPHORUS: Phosphorus: 3.9 mg/dL (ref 2.5–4.6)

## 2019-12-15 MED ORDER — ENOXAPARIN SODIUM 30 MG/0.3ML ~~LOC~~ SOLN
30.0000 mg | SUBCUTANEOUS | Status: DC
  Administered 2019-12-15 – 2019-12-17 (×3): 30 mg via SUBCUTANEOUS
  Filled 2019-12-15 (×3): qty 0.3

## 2019-12-15 MED ORDER — ACETAMINOPHEN 650 MG RE SUPP: 650.0000 mg | Freq: Four times a day (QID) | RECTAL | Status: DC | PRN

## 2019-12-15 MED ORDER — EZETIMIBE 10 MG PO TABS
10.0000 mg | ORAL_TABLET | Freq: Every day | ORAL | Status: DC
  Administered 2019-12-16 – 2019-12-23 (×8): 10 mg via ORAL
  Filled 2019-12-15 (×10): qty 1

## 2019-12-15 MED ORDER — FOLIC ACID 1 MG PO TABS
2.0000 mg | ORAL_TABLET | Freq: Every day | ORAL | Status: DC
  Administered 2019-12-15 – 2019-12-23 (×9): 2 mg via ORAL
  Filled 2019-12-15 (×9): qty 2

## 2019-12-15 MED ORDER — ACETAMINOPHEN 325 MG PO TABS
650.0000 mg | ORAL_TABLET | Freq: Four times a day (QID) | ORAL | Status: DC | PRN
  Administered 2019-12-17 – 2019-12-22 (×3): 650 mg via ORAL
  Filled 2019-12-15 (×3): qty 2

## 2019-12-15 MED ORDER — SODIUM CHLORIDE 0.9 % IV SOLN
1.0000 g | Freq: Once | INTRAVENOUS | Status: AC
  Administered 2019-12-15: 1 g via INTRAVENOUS
  Filled 2019-12-15: qty 10

## 2019-12-15 MED ORDER — LEVOTHYROXINE SODIUM 75 MCG PO TABS
75.0000 ug | ORAL_TABLET | Freq: Every day | ORAL | Status: DC
  Administered 2019-12-16: 75 ug via ORAL
  Filled 2019-12-15: qty 1

## 2019-12-15 MED ORDER — SODIUM CHLORIDE 0.9 % IV SOLN: INTRAVENOUS | Status: DC

## 2019-12-15 MED ORDER — SODIUM CHLORIDE 0.9 % IV SOLN: Freq: Once | INTRAVENOUS | Status: DC

## 2019-12-15 MED ORDER — ATORVASTATIN CALCIUM 40 MG PO TABS
40.0000 mg | ORAL_TABLET | Freq: Every day | ORAL | Status: DC
  Administered 2019-12-15 – 2019-12-22 (×8): 40 mg via ORAL
  Filled 2019-12-15 (×6): qty 1

## 2019-12-15 NOTE — ED Triage Notes (Addendum)
Pt here from home, where she lives alone, via EMS for generalized weakness x 6 days. Pt denies complaints today. Son has noticed decreased appetite over the last few days also. Mechanical fall onto bottom last night without injury. A/O x 4.

## 2019-12-15 NOTE — H&P (Signed)
History and Physical    [name]:[id number] DOB: [birth year] DOA: 12/15/2019  PCP: [name], MD  Patient coming from: Home I have personally briefly reviewed patient's old medical records in [place]  Chief Complaint: " I am dehydrated I guess"  HPI: [name] is a 86 y.o. female with medical history significant of moderate aortic stenosis, coronary artery disease status post PCI, persistent A. fib, hypertension, hyperlipidemia, hypothyroidism, low-grade lymphoma, chronic diastolic CHF presents to emergency department with generalized weakness since 1 week.  Patient tells me that she is dehydrated however denies any headache, blurry vision, lightheadedness, dizziness, chest pain, shortness of breath, palpitation, nausea, vomiting, diarrhea, cough, congestion, fever, chills, abdominal pain, urinary or bowel changes.  I called patient's [facility] tells me that patient has been extremely weak since 1 week, unable to walk and fell last week however  denies head trauma, seizure or loss of consciousness.  He tells me that since patient started on Eliquis couple of months ago due to A. fib she has multiple knots on her head.  Due to fall he called patient's cardiology who recommended to hold Eliquis for at least 3 days.  Her blood pressure was 100/70 yesterday.  The only change in her medicine recently is that her PCP increased her levothyroxine dose due to elevated TSH about 2 weeks ago.  She lives alone at home however she has caregiver and son who takes care of her.  No history of smoking, alcohol, illicit drug use.  ED Course: Upon arrival to ED: Patient's blood pressure was noted to be on lower side.  Afebrile with no leukocytosis.  CMP shows worsening kidney function.  Calcium of 14.0, troponin 28 trended down to 27, lactic acid: WNL, UA negative for infection, blood culture pending.  CT head shows several scalp soft tissue masses, no infarction or hemorrhage.  Chest  x-ray shows multiple opacities.  Patient received IV fluid and Rocephin in ED.  [facility] hospitalist consulted for admission for further evaluation and management of hypercalcemia and generalized weakness. Review of Systems: As per HPI otherwise negative.    Past Medical History:  Diagnosis Date  .  Acute respiratiory failure requiring BiPap 06/24/2011  . Allergy   . Aortic valvular stenosis, moderate to severe 06/24/2011   a. 10/2012 Echo: mod AS, Valve 1.25 cm^2 (VTI), 1.19cm^2 (Vmax).  . Arthritis    "back" (10/30/2012)  . Chronic diastolic CHF (congestive heart failure) ([place])    a. 10/2012 Echo: EF 55-60%, no rwma, Gr 2 DD, moderate AS, mod dil LA, mildly to mod dil RA.  Marland Kitchen Coronary artery disease    a. 03/2009 PCI RCA (3.0x18 BMS).  . Dyslipidemia   . Exertional shortness of breath   . Hypertension   . Hypothyroidism   . PAF (paroxysmal atrial fibrillation) ([place])    a. CHA2DS2VASc = 7-->chronic coumadin;  b. s/p DCCV 05/2011, 05/2012, 07/2014;  c. On amio.  . Rash    a. felt to be 2/2 lasix/torsemide in setting of sulfa allergy (lasix d/c ~ 06/2014, torsemide d/c 08/09/2014).    Past Surgical History:  Procedure Laterality Date  . ANGIOPLASTY    . APPENDECTOMY    . BACK SURGERY    . CARDIAC CATHETERIZATION  10/06/2009   Continue medical therapy  . CARDIAC CATHETERIZATION  04/07/2009   RCA stented with a 3x11m stent resulting in a reduction of 90% narrowing to normal  . CARDIOVERSION  06/19/2011   Procedure: CARDIOVERSION;  Surgeon: MDani Gobble  Croitoru, MD;  Location: [facility];  Service: Cardiovascular;  Laterality: N/A;  . CARDIOVERSION  07/11/2010   Successful coversion to sinus rhythm  . CARDIOVERSION N/A 08/10/2014   Procedure: CARDIOVERSION;  Surgeon: Sanda Klein, MD;  Location: [facility];  Service: Cardiovascular;  Laterality: N/A;  . CATARACT EXTRACTION W/ INTRAOCULAR LENS  IMPLANT, BILATERAL    . CORONARY ANGIOPLASTY WITH STENT PLACEMENT     "1" (10/30/2012)  . DILATION AND  CURETTAGE OF UTERUS    . EXCISIONAL HEMORRHOIDECTOMY    . FRACTURE SURGERY    . HIP ARTHROPLASTY Right 04/17/2015   Procedure: HIP MONOPOLAR HIP HEMI ARTHROPLASTY;  Surgeon: Marybelle Killings, MD;  Location: [facility];  Service: Orthopedics;  Laterality: Right;  . IR THORACENTESIS ASP PLEURAL SPACE W/IMG GUIDE  04/28/2018  . IR THORACENTESIS ASP PLEURAL SPACE W/IMG GUIDE  05/26/2018  . LUMBAR DISC SURGERY  X 2   "cleaned out scar tissue"   . SHOULDER ACROMIOPLASTY Left    "fell; put a new ball in" (10/30/2012)  . WRIST FRACTURE SURGERY Left      reports that she has never smoked. She has never used smokeless tobacco. She reports that she does not drink alcohol and does not use drugs.  Allergies  Allergen Reactions  . [name] [Ethacrynic Acid] Nausea And Vomiting  . Red Dye   . Furosemide Rash    Tolerates Bumex  . Sulfa Antibiotics Itching and Rash  . Tape Rash and Other (See Comments)    Tears skin off.  Please use "paper" tape only.   . [name] Rash    Per Dr Sallyanne Kuster, had a skin reaction with thiazides prior to use of Lasix  . Torsemide Rash    Tolerates Bumex    Family History  Problem Relation Age of Onset  . Heart disease Brother   . Heart disease Brother   . Lung disease Brother   . Heart disease Mother 76  . Cancer Mother 56       Uterine  . Stroke Mother 89  . Heart failure Mother   . Hypertension Mother   . Heart disease Father   . Heart attack Father   . Hypertension Father   . Hypertension Child 34  . Alcohol abuse Child   . Cancer Son   . Hypertension Sister   . Hypertension Brother     Prior to Admission medications   Medication Sig Start Date End Date Taking? Authorizing Provider  acetaminophen (TYLENOL) 650 MG CR tablet Take 650 mg by mouth 3 (three) times daily. Morning, supper and bedtime   Yes [provider]  atorvastatin (LIPITOR) 40 MG tablet TAKE 1 TABLET DAILY AT 6 P.M. Patient taking differently: Take 40 mg by mouth daily  at 6 PM.  02/19/19  Yes Croitoru, Mihai, MD  benzonatate (TESSALON) 100 MG capsule Take 100 mg by mouth at bedtime.   Yes [provider]  Cholecalciferol (VITAMIN D) 50 MCG (2000 UT) tablet Take 2,000 Units by mouth daily.   Yes [provider]  diphenhydramine-acetaminophen (ACETAMINOPHEN PM) 25-500 MG TABS tablet Take 1 tablet by mouth at bedtime.    Yes [provider]  ezetimibe (ZETIA) 10 MG tablet TAKE 1 TABLET(10 MG) BY MOUTH DAILY Patient taking differently: Take 10 mg by mouth daily.  09/27/19  Yes [name], MD  folic acid (FOLVITE) 1 MG tablet TAKE 2 TABLETS(2 MG) BY MOUTH DAILY Patient taking differently: Take 2 mg by mouth daily.  09/28/19  Yes [name], MD  levothyroxine (SYNTHROID) 75 MCG tablet Take 1 tablet (75 mcg total) by mouth daily. 12/03/19  Yes [name], MD  loratadine (CLARITIN) 10 MG tablet Take 10 mg by mouth daily.   Yes [provider]  metoprolol tartrate (LOPRESSOR) 25 MG tablet Take 1 tablet (25 mg total) by mouth 2 (two) times daily. 08/05/19  Yes [name], MD  multivitamin-iron-minerals-folic acid (CENTRUM) chewable tablet Chew 1 tablet by mouth daily.   Yes [provider]  spironolactone (ALDACTONE) 25 MG tablet TAKE 1 TABLET(25 MG) BY MOUTH DAILY Patient taking differently: Take 25 mg by mouth daily.  09/28/19  Yes [name], MD  triamcinolone cream (KENALOG) 0.1 % APPLY CREAM EXTERNALLY TO AFFECTED AREA TWICE DAILY AS NEEDED (NOT FACE GROIN OR UNDERARMS) Patient taking differently: Apply 1 application topically 2 (two) times daily as needed (back rash). APPLY CREAM EXTERNALLY TO AFFECTED AREA TWICE DAILY AS NEEDED (NOT FACE GROIN OR UNDERARMS) 08/05/19  Yes [name], MD  vitamin C (ASCORBIC ACID) 500 MG tablet Take 500 mg by mouth daily.   Yes [provider]  bumetanide (BUMEX) 1 MG tablet Take 1 tablet (1 mg total) by mouth daily as needed (for wt gain of 2 pounds in a day or 5  pounds in a week, swelling or shortness of breath). 08/05/19 08/04/20  [name], MD  ipratropium (ATROVENT) 0.03 % nasal spray Place 2 sprays into both nostrils 3 (three) times daily as needed for rhinitis. Patient not taking: Reported on 12/15/2019 08/05/19   [name], MD  magnesium hydroxide (MILK OF MAGNESIA) 400 MG/5ML suspension Take 15 mLs by mouth daily as needed for mild constipation. Patient not taking: Reported on 12/15/2019 10/08/16   Lavina Hamman, MD    Physical Exam: Vitals:   12/15/19 0958 12/15/19 1000 12/15/19 1100 12/15/19 1230  BP:  106/71 (!) 96/56 (!) 98/53  Pulse:  99 92 72  Resp:  '16 17 16  '$ Temp:      TempSrc:      SpO2:  98% 95% 100%  Weight: 52.6 kg     Height: 5' (1.524 m)       Constitutional: NAD, calm, comfortable, room air, communicating well, following commands, appears very dehydrated Eyes: PERRL, lids and conjunctivae normal ENMT: Mucous membranes are dry. Posterior pharynx clear of any exudate or lesions.Normal dentition.  Neck: normal, supple, no masses, no thyromegaly Respiratory: clear to auscultation bilaterally, no wheezing, no crackles. Normal respiratory effort. No accessory muscle use.  Cardiovascular: Regular rate and rhythm, no murmurs / rubs / gallops. No extremity edema. 2+ pedal pulses. No carotid bruits.  Abdomen: no tenderness, no masses palpated. No hepatosplenomegaly. Bowel sounds positive.  Musculoskeletal: no clubbing / cyanosis. No joint deformity upper and lower extremities. Good ROM, no contractures. Normal muscle tone.  Skin: Multiple circular swelling noted on head.  Nontender, firm on palpation, no bleeding or discharge seen.   Neurologic: CN 2-12 grossly intact. Sensation intact, DTR normal. Strength 5/5 in all 4.  Psychiatric: Normal judgment and insight. Alert and oriented x 3. Normal mood.    Labs on Admission: I have personally reviewed following labs and imaging studies  CBC: Recent Labs  Lab  12/14/19 1556 12/15/19 1005  WBC 7.0 7.9  NEUTROABS  --  6.3  HGB 10.9* 11.0*  HCT 34.1 35.8*  MCV 91 92.5  PLT 177 235   Basic Metabolic Panel: Recent Labs  Lab 12/14/19  1556 12/15/19 1005  NA 142 141  K 4.7 4.1  CL 104 105  CO2 26 25  GLUCOSE 110* 97  BUN 63* 60*  CREATININE 2.53* 2.34*  CALCIUM 13.2* 14.0*   GFR: Estimated Creatinine Clearance: 10.6 mL/min (A) (by C-G formula based on SCr of 2.34 mg/dL (H)). Liver Function Tests: Recent Labs  Lab 12/15/19 1005  AST 32  ALT 20  ALKPHOS 99  BILITOT 1.8*  PROT 6.5  ALBUMIN 3.4*   No results for input(s): LIPASE, AMYLASE in the last 168 hours. No results for input(s): AMMONIA in the last 168 hours. Coagulation Profile: Recent Labs  Lab 12/15/19 1005  INR 1.2   Cardiac Enzymes: No results for input(s): CKTOTAL, CKMB, CKMBINDEX, TROPONINI in the last 168 hours. BNP (last 3 results) No results for input(s): PROBNP in the last 8760 hours. HbA1C: No results for input(s): HGBA1C in the last 72 hours. CBG: No results for input(s): GLUCAP in the last 168 hours. Lipid Profile: No results for input(s): CHOL, HDL, LDLCALC, TRIG, CHOLHDL, LDLDIRECT in the last 72 hours. Thyroid Function Tests: No results for input(s): TSH, T4TOTAL, FREET4, T3FREE, THYROIDAB in the last 72 hours. Anemia Panel: No results for input(s): VITAMINB12, FOLATE, FERRITIN, TIBC, IRON, RETICCTPCT in the last 72 hours. Urine analysis:    Component Value Date/Time   COLORURINE YELLOW 12/15/2019 1154   APPEARANCEUR TURBID (A) 12/15/2019 1154   LABSPEC 1.010 12/15/2019 1154   PHURINE 7.0 12/15/2019 1154   GLUCOSEU NEGATIVE 12/15/2019 1154   HGBUR NEGATIVE 12/15/2019 1154   BILIRUBINUR NEGATIVE 12/15/2019 1154   KETONESUR NEGATIVE 12/15/2019 1154   PROTEINUR NEGATIVE 12/15/2019 1154   UROBILINOGEN 1.0 11/01/2012 1849   NITRITE NEGATIVE 12/15/2019 1154   LEUKOCYTESUR NEGATIVE 12/15/2019 1154    Radiological Exams on Admission: CT Head  Wo Contrast  Result Date: 12/15/2019 CLINICAL DATA:  Dizziness EXAM: CT HEAD WITHOUT CONTRAST TECHNIQUE: Contiguous axial images were obtained from the base of the skull through the vertex without intravenous contrast. COMPARISON:  None. FINDINGS: Brain: There is no acute intracranial hemorrhage, mass effect, or edema. No acute appearing loss of gray-white differentiation. There is no extra-axial fluid collection. Chronic infarction involving primarily the right parietotemporal lobes and insula patchy and confluent areas of hypoattenuation in the supratentorial white matter are nonspecific but probably reflect moderate chronic microvascular ischemic changes. Prominence of the ventricles and sulci reflects generalized parenchymal volume loss. Vascular: There is atherosclerotic calcification at the skull base. Skull: Calvarium is unremarkable. Sinuses/Orbits: No acute finding. Other: Few scalp soft tissue masses, largest in the lateral right frontal region. IMPRESSION: No acute intracranial abnormality. Chronic right MCA territory infarction. Moderate chronic microvascular ischemic changes. Several scalp soft tissue masses, largest in the right lateral frontal region. Please correlate with history and exam. Electronically Signed   By: Macy Mis M.D.   On: 12/15/2019 13:07   DG Chest Port 1 View  Result Date: 12/15/2019 CLINICAL DATA:  Generalized weakness. EXAM: PORTABLE CHEST 1 VIEW COMPARISON:  05/30/2019 FINDINGS: The cardio pericardial silhouette is enlarged. Interstitial markings are diffusely coarsened with chronic features. Patchy/nodular airspace disease is seen in the mid and lower lungs bilaterally. Left base collapse/consolidation with small left pleural effusion again noted. Bones are diffusely demineralized. Status post left shoulder replacement. Telemetry leads overlie the chest. IMPRESSION: 1. Patchy/nodular airspace disease in the mid and lower lungs bilaterally. Imaging features may be  related to pulmonary edema although multifocal pneumonia a concern. Given nodular character in some regions, chest CT may  be warranted to further evaluate. 2. Left base collapse/consolidation with small left pleural effusion, similar to prior. Electronically Signed   By: Misty Stanley M.D.   On: 12/15/2019 10:36    EKG: Independently reviewed.   Assessment/Plan Principal Problem:   Generalized weakness Active Problems:   Hyperlipidemia   Hypothyroidism, goitrous   Coronary artery disease   Aortic valvular stenosis, moderate to severe   Essential hypertension   Acute on chronic kidney failure (HCC)   Hypercalcemia   Generalized weakness: -Could be secondary to dehydration and hypercalcemia 2/2 MM?Marland Kitchen  She is afebrile with no leukocytosis.  COVID-19: Pending. CMP shows worsening kidney function. -Reviewed chest x-ray and CT head -Received IV fluid and Rocephin in ED. -Admit patient at stepdown unit for close monitoring.  Blood pressure noted to be on lower side. -Started on IV fluids.  Monitor vitals closely.  Hold antihypertensive for now. -Monitor kidney function closely. -Consult PT/OT -On fall precautions  Hypercalcemia: Unknown etiology -Corrected calcium level for albumin is 14.5.  Check ionized calcium. -Could be multiple myeloma versus other underlying malignancy - Discontinue patient's home calcium with vitamin D.  - Check PTH,SPEP & UPEP and vitamin D level. - Aggressively hydrate the patient on IV fluids with normal saline and 125 cc per hour  - May consider renal consultation if no clear etiology for hypercalcemia could be found.  Hypotension: Lowest blood pressure 96/56.  Continue IV fluids.   -Hold home antihypertensive meds for now.  Monitor blood pressure closely.  Pulmonary nodules: Noted on chest x-ray -Received Rocephin in the ED for the concern of pneumonia.  Maintaining oxygen saturation on room air.  Exam: Benign. -Patient is afebrile.  No leukocytosis.  No  respiratory symptoms.   -Check procalcitonin level.  Will get CT chest for further evaluation. -We will hold off antibiotics at this time.  Acute on chronic kidney disease stage III B: -Creatinine: 2.34, GFR: 17 (baseline creatinine: 1.44, GFR: 36) -Continue IV fluids.  Avoid nephrotoxic medication.  Monitor kidney function closely.  Multiple soft tissue mass/nodule: Noted on CT head  Hypothyroidism: Her levothyroxine dose was recently increased by PCP -Continue levothyroxine  Hyperlipidemia: Continue statin and Zetia  Chronic diastolic CHF: Patient appears euvolemic -Hold Bumex for now due to AKI and dehydration -Strict INO's and daily weight -Monitor electrolytes.  Monitor signs for fluid overload.  Aortic valve stenosis: Reviewed recent echo.  On telemetry.  Patient denies symptoms.  Coronary artery disease status post PCI: Continue statin, hold beta-blocker due to hypotension.  Persistent A. fib: Hold metoprolol due to hypotension.  She is off of Eliquis as per cardiology recommendation due to recent fall.  CHA2DS2-VASc score is 6. -Monitor on telemetry.  DVT prophylaxis: Lovenox/SCD Code Status: DNR-confirmed with patient's son Family Communication: None present at bedside.  Plan of care discussed with patient in length and he verbalized understanding and agreed with it. Disposition Plan: Home in 2 to 3 days Consults called: None  admission status: Inpatient  Mckinley Jewel MD [facility]  If 7PM-7AM, please contact night-coverage [email or web address] Password [email or web address]  12/15/2019, 1:42 PM

## 2019-12-15 NOTE — ED Notes (Signed)
Pt transferred to hospital bed for comfort and pt assisted to the restroom with 2 staff assist.

## 2019-12-15 NOTE — ED Notes (Signed)
Patient is resting comfortably. 

## 2019-12-15 NOTE — ED Notes (Signed)
Patient is resting comfortably. Chest rising and falling evenly.

## 2019-12-15 NOTE — ED Notes (Signed)
Attempted to call report

## 2019-12-15 NOTE — ED Notes (Addendum)
Patient is resting comfortably.Chest rising and falling evenly.

## 2019-12-16 ENCOUNTER — Inpatient Hospital Stay (HOSPITAL_COMMUNITY): Payer: Medicare Other

## 2019-12-16 DIAGNOSIS — N179 Acute kidney failure, unspecified: Secondary | ICD-10-CM | POA: Diagnosis not present

## 2019-12-16 DIAGNOSIS — I350 Nonrheumatic aortic (valve) stenosis: Secondary | ICD-10-CM | POA: Diagnosis not present

## 2019-12-16 DIAGNOSIS — R531 Weakness: Secondary | ICD-10-CM | POA: Diagnosis not present

## 2019-12-16 DIAGNOSIS — I10 Essential (primary) hypertension: Secondary | ICD-10-CM | POA: Diagnosis not present

## 2019-12-16 LAB — COMPREHENSIVE METABOLIC PANEL
ALT: 17 U/L (ref 0–44)
AST: 24 U/L (ref 15–41)
Albumin: 2.8 g/dL — ABNORMAL LOW (ref 3.5–5.0)
Alkaline Phosphatase: 85 U/L (ref 38–126)
Anion gap: 10 (ref 5–15)
BUN: 61 mg/dL — ABNORMAL HIGH (ref 8–23)
CO2: 26 mmol/L (ref 22–32)
Calcium: 12.6 mg/dL — ABNORMAL HIGH (ref 8.9–10.3)
Chloride: 107 mmol/L (ref 98–111)
Creatinine, Ser: 2.74 mg/dL — ABNORMAL HIGH (ref 0.44–1.00)
GFR calc Af Amer: 17 mL/min — ABNORMAL LOW (ref >60)
GFR calc non Af Amer: 14 mL/min — ABNORMAL LOW (ref >60)
Glucose, Bld: 98 mg/dL (ref 70–99)
Potassium: 4.3 mmol/L (ref 3.5–5.1)
Sodium: 143 mmol/L (ref 135–145)
Total Bilirubin: 1.4 mg/dL — ABNORMAL HIGH (ref 0.3–1.2)
Total Protein: 5.6 g/dL — ABNORMAL LOW (ref 6.5–8.1)

## 2019-12-16 LAB — CALCIUM, IONIZED: Calcium, Ionized, Serum: 7.8 mg/dL — ABNORMAL HIGH (ref 4.5–5.6)

## 2019-12-16 LAB — CBC
HCT: 32.2 % — ABNORMAL LOW (ref 36.0–46.0)
Hemoglobin: 10 g/dL — ABNORMAL LOW (ref 12.0–15.0)
MCH: 29.3 pg (ref 26.0–34.0)
MCHC: 31.1 g/dL (ref 30.0–36.0)
MCV: 94.4 fL (ref 80.0–100.0)
Platelets: 178 10*3/uL (ref 150–400)
RBC: 3.41 MIL/uL — ABNORMAL LOW (ref 3.87–5.11)
RDW: 14.5 % (ref 11.5–15.5)
WBC: 7.1 10*3/uL (ref 4.0–10.5)
nRBC: 0 % (ref 0.0–0.2)

## 2019-12-16 LAB — T4, FREE: Free T4: 1.36 ng/dL — ABNORMAL HIGH (ref 0.61–1.12)

## 2019-12-16 LAB — PTH, INTACT AND CALCIUM
Calcium, Total (PTH): 13.8 mg/dL (ref 8.7–10.3)
PTH: 9 pg/mL — ABNORMAL LOW (ref 15–65)

## 2019-12-16 NOTE — Progress Notes (Signed)
PROGRESS NOTE  [name] MPN:[id number] DOB: March 30, 1924 DOA: 12/15/2019 PCP: [name], MD  HPI/Recap of past 24 hours: HPI from Dr [name] is a 86 y.o. female with medical history significant of moderate aortic stenosis, CAD s/p PCI, persistent A. Fib, HTN, HLD, hypothyroidism, low-grade lymphoma, chronic diastolic CHF presents to emergency department with generalized weakness since 1 week. Patient's son reports patient has been extremely weak since 1 week, unable to walk and fell last week however denies head trauma, seizure or loss of consciousness. Reports that since patient started on Eliquis couple of months ago due to A. fib she has multiple knots on her head.  Due to fall he called patient's cardiology who recommended to hold Eliquis for at least 3 days. The only change in her medicine recently is that her PCP increased her levothyroxine dose due to elevated TSH about 2 weeks ago. Pt lives alone at home however she has caregiver and son who takes care of her. In the ED, patient's BP was noted to be on lower side. CMP shows worsening kidney function.  Calcium of 14.0, troponin 28 trended down to 27, lactic acid: WNL, UA negative for infection, blood culture pending. CT head shows several scalp soft tissue masses, no infarction or hemorrhage.  Chest x-ray shows multiple opacities. [facility] consulted for admission for further evaluation and management of hypercalcemia and generalized weakness.    Today, patient reports feeling okay, still reports generalized weakness, denies any chest pain, shortness of breath, abdominal pain, nausea/vomiting, fever/chills.  Patient noted to have soft BPs with heart rates fluctuating in the 110s  Assessment/Plan: Principal Problem:   Generalized weakness Active Problems:   Hyperlipidemia   Hypothyroidism, goitrous   Coronary artery disease   Aortic valvular stenosis, moderate to severe   Essential hypertension    Acute on chronic kidney failure (HCC)   Hypercalcemia   Hypercalcemia Unknown etiology, ??Malignancy vs multiple myeloma PTH low, parathyroid related protein pending, vitamin D WNL, SPEP and UPEP pending Hold home calcium and vitamin D Continue aggressive hydration with IV fluids May consider nephrology consult if no significant improvement with IV fluids Daily BMP  AKI on CKD stage IIIb Creatinine around 1.4, on admission 2.34 Continue aggressive IV fluid Daily BMP  Hypotension BP soft Hold home antihypertensive for now Continue aggressive IV fluids  Generalized weakness/fall Multifactorial, hypercalcemia vs AKI vs hypotension vs possible malignancy Continue IV fluid PT/OT Fall precautions  Pulmonary nodules likely lung malignancy Multiple soft tissue mass/nodules on head CT chest showed numerous bilateral pulmonary nodules suspicious for metastatic disease CT abdomen/pelvis without contrast pending We will discuss with family and oncology pending results of CT/abdomen  Hypothyroidism Synthroid dose was recently increased by PCP TSH low 0.189, free T4 high 1.36 Hold home Synthroid for now, plan to repeat TSH/free T4 and possibly reduce dose  Chronic diastolic HF/aortic valve stenosis Appears dry Hold home Bumex for now due to AKI and  dehydration Monitor closely  Persistent A. Fib Heart rate fluctuating Continue to hold metoprolol due to hypotension Continue to hold of Eliquis as per cardiology due to recent fall Telemetry       Malnutrition Type:      Malnutrition Characteristics:      Nutrition Interventions:       Estimated body mass index is 20.62 kg/m as calculated from the following:   Height as of this encounter: '5\' 3"'$  (1.6 m).   Weight as of this encounter: 52.8 kg.  Code Status: DNR  Family Communication: Plan to discuss with son  Disposition Plan: Status is: Inpatient  Remains inpatient appropriate because:Inpatient level  of care appropriate due to severity of illness   Dispo: The patient is from: Home              Anticipated d/c is to: SNF              Anticipated d/c date is: 2 days              Patient currently is not medically stable to d/c.     Consultants:  None  Procedures:  None  Antimicrobials:  None  DVT prophylaxis: Lovenox   Objective: Vitals:   12/16/19 1042 12/16/19 1150 12/16/19 1245 12/16/19 1435  BP:  (!) 82/41 (!) 105/49 (!) 93/54  Pulse: (!) 125 83 85 83  Resp:  '21 20 17  '$ Temp:  97.9 F (36.6 C)    TempSrc:  Oral    SpO2:  98% 100% 98%  Weight:      Height:        Intake/Output Summary (Last 24 hours) at 12/16/2019 1449 Last data filed at 12/16/2019 1400 Gross per 24 hour  Intake 575.37 ml  Output 851 ml  Net -275.63 ml   Filed Weights   12/15/19 0958 12/16/19 0030 12/16/19 0418  Weight: 52.6 kg 52.8 kg 52.8 kg    Exam:  General: NAD, oriented x3, knots noted on scalp, nontender  Cardiovascular: S1, S2 present  Respiratory: CTAB  Abdomen: Soft, nontender, nondistended, bowel sounds present  Musculoskeletal: No bilateral pedal edema noted  Skin: Normal  Psychiatry: Normal mood   Data Reviewed: CBC: Recent Labs  Lab 12/14/19 1556 12/15/19 1005 12/15/19 1422 12/16/19 0229  WBC 7.0 7.9 7.4 7.1  NEUTROABS  --  6.3  --   --   HGB 10.9* 11.0* 11.2* 10.0*  HCT 34.1 35.8* 36.7 32.2*  MCV 91 92.5 94.8 94.4  PLT 177 183 192 765   Basic Metabolic Panel: Recent Labs  Lab 12/14/19 1556 12/15/19 1005 12/15/19 1422 12/16/19 0229  NA 142 141  --  143  K 4.7 4.1  --  4.3  CL 104 105  --  107  CO2 26 25  --  26  GLUCOSE 110* 97  --  98  BUN 63* 60*  --  61*  CREATININE 2.53* 2.34* 2.35* 2.74*  CALCIUM 13.2* 14.0* 13.8* 12.6*  MG  --   --  2.6*  --   PHOS  --   --  3.9  --    GFR: Estimated Creatinine Clearance: 10.4 mL/min (A) (by C-G formula based on SCr of 2.74 mg/dL (H)). Liver Function Tests: Recent Labs  Lab 12/15/19 1005  12/16/19 0229  AST 32 24  ALT 20 17  ALKPHOS 99 85  BILITOT 1.8* 1.4*  PROT 6.5 5.6*  ALBUMIN 3.4* 2.8*   No results for input(s): LIPASE, AMYLASE in the last 168 hours. No results for input(s): AMMONIA in the last 168 hours. Coagulation Profile: Recent Labs  Lab 12/15/19 1005  INR 1.2   Cardiac Enzymes: No results for input(s): CKTOTAL, CKMB, CKMBINDEX, TROPONINI in the last 168 hours. BNP (last 3 results) No results for input(s): PROBNP in the last 8760 hours. HbA1C: No results for input(s): HGBA1C in the last 72 hours. CBG: No results for input(s): GLUCAP in the last 168 hours. Lipid Profile: No results for input(s): CHOL, HDL, LDLCALC, TRIG, CHOLHDL, LDLDIRECT in the last  72 hours. Thyroid Function Tests: Recent Labs    12/15/19 1422  TSH 0.189*  FREET4 1.36*   Anemia Panel: No results for input(s): VITAMINB12, FOLATE, FERRITIN, TIBC, IRON, RETICCTPCT in the last 72 hours. Urine analysis:    Component Value Date/Time   COLORURINE YELLOW 12/15/2019 1154   APPEARANCEUR TURBID (A) 12/15/2019 1154   LABSPEC 1.010 12/15/2019 1154   PHURINE 7.0 12/15/2019 1154   GLUCOSEU NEGATIVE 12/15/2019 1154   HGBUR NEGATIVE 12/15/2019 1154   BILIRUBINUR NEGATIVE 12/15/2019 1154   KETONESUR NEGATIVE 12/15/2019 1154   PROTEINUR NEGATIVE 12/15/2019 1154   UROBILINOGEN 1.0 11/01/2012 1849   NITRITE NEGATIVE 12/15/2019 1154   LEUKOCYTESUR NEGATIVE 12/15/2019 1154   Sepsis Labs: '@LABRCNTIP'$ (procalcitonin:4,lacticidven:4)  ) Recent Results (from the past 240 hour(s))  SARS Coronavirus 2 by RT PCR (hospital order, performed in [facility] hospital lab) Nasopharyngeal Nasopharyngeal Swab     Status: None   Collection Time: 12/15/19  2:46 PM   Specimen: Nasopharyngeal Swab  Result Value Ref Range Status   SARS Coronavirus 2 NEGATIVE NEGATIVE Final    Comment: (NOTE) SARS-CoV-2 target nucleic acids are NOT DETECTED.  The SARS-CoV-2 RNA is generally detectable in upper and  lower respiratory specimens during the acute phase of infection. The lowest concentration of SARS-CoV-2 viral copies this assay can detect is 250 copies / mL. A negative result does not preclude SARS-CoV-2 infection and should not be used as the sole basis for treatment or other patient management decisions.  A negative result may occur with improper specimen collection / handling, submission of specimen other than nasopharyngeal swab, presence of viral mutation(s) within the areas targeted by this assay, and inadequate number of viral copies (<250 copies / mL). A negative result must be combined with clinical observations, patient history, and epidemiological information.  Fact Sheet for Patients:   StrictlyIdeas.no  Fact Sheet for Healthcare Providers: BankingDealers.co.za  This test is not yet approved or  cleared by the [place] FDA and has been authorized for detection and/or diagnosis of SARS-CoV-2 by FDA under an Emergency Use Authorization (EUA).  This EUA will remain in effect (meaning this test can be used) for the duration of the COVID-19 declaration under Section 564(b)(1) of the Act, 21 U.S.C. section 360bbb-3(b)(1), unless the authorization is terminated or revoked sooner.  Performed at [facility], [place] [address], [place], [place] [address]       Studies: CT CHEST WO CONTRAST  Result Date: 12/15/2019 CLINICAL DATA:  Abnormal x-ray. Lung opacities. Generalized weakness for 6 days. Decreased appetite. EXAM: CT CHEST WITHOUT CONTRAST TECHNIQUE: Multidetector CT imaging of the chest was performed following the standard protocol without IV contrast. COMPARISON:  12/15/2019 FINDINGS: Cardiovascular: Heart is mildly enlarged. There is no pericardial effusion. There is extensive atherosclerotic calcification of the coronary arteries and mitral annulus. Extensive atherosclerotic calcification of the thoracic aorta is  present. No associated aneurysm. Mediastinum/Nodes: The visualized portion of the thyroid gland has a normal appearance. Pretracheal lymph node is 1.1 centimeters. Precarinal lymph node is 1.0 centimeters. No significant hilar adenopathy. Esophagus is unremarkable. Lungs/Pleura: There are numerous nodules throughout the lungs. Nodules involve all lobes and measure from 3 millimeters to 2.0 cm. A 2.0 centimeter nodule in the LEFT UPPER lobe on image 78 of series 4.) there are small bilateral pleural effusions, LEFT greater than RIGHT. Bibasilar atelectasis is present. No airspace filling opacities to indicate infectious process. Upper Abdomen: Noncontrast appearance of the liver is unremarkable. No UPPER abdominal abnormality. Musculoskeletal:  Moderate degenerative changes are seen throughout the thoracic spine. No suspicious lytic or blastic lesions are identified. Remote rib fractures. Remote LEFT shoulder arthroplasty. IMPRESSION: 1. Numerous bilateral pulmonary nodules, suspicious for metastatic disease. 2. Small bilateral pleural effusions, LEFT greater than RIGHT. 3. Cardiomegaly and coronary artery disease. 4. Aortic Atherosclerosis (ICD10-I70.0). Electronically Signed   By: Nolon Nations M.D.   On: 12/15/2019 16:51    Scheduled Meds: . atorvastatin  40 mg Oral q1800  . enoxaparin (LOVENOX) injection  30 mg Subcutaneous Q24H  . ezetimibe  10 mg Oral Daily  . folic acid  2 mg Oral Daily    Continuous Infusions: . sodium chloride 125 mL/hr at 12/16/19 1150     LOS: 1 day     Alma Friendly, MD [facility]  If 7PM-7AM, please contact night-coverage [email or web address] 12/16/2019, 2:49 PM

## 2019-12-16 NOTE — Evaluation (Signed)
Occupational Therapy Evaluation Patient Details Name: [name] MRN: [id number] DOB: 1924/04/18 Today's Date: 12/16/2019    History of Present Illness 86 y.o. female presenting with generalized weakness x1 wk, dehydration and hypercalcemia. PMHx significant for aortic stenosis, CAD s/p PCI, A-fib, HTN, HLD, hypothyroidism, low grade lymphoma, and chronic diastolic CHF.    Clinical Impression   PTA patient was living alone in a private residence and was completing BADLs including bathing seated on tub bench, toileting/hygiene/clothing management, and dressing without assistance. Pt. also reports independence with light meal prep preparing oatmeal for breakfast, sandwiches, and microwaved dinner meals delivered by her daughter. Patient reports being an independent household and community ambulator. Per chart review and patient report, patient has PRN supervision/assist from family and a friend who stays overnight. Patient currently presents slightly below baseline level of function requiring supervision A to Min A grossly for functional transfers and mobility with and without AD. Patient also requiring supervision A to [place] A for BADLs. Patient would benefit from continued acute OT services to increase safety and independence with self-care tasks, household mobility, and transfers in prep for d/c to next level of care. HHOT and 24 hour supervision/assist recommended for safe transition home. Patient may benefit from short-term SNF placement prior to d/c home if unable to find additional daytime assistance from family.     Follow Up Recommendations  Home health OT;Supervision/Assistance - 24 hour;SNF    Equipment Recommendations  3 in 1 bedside commode    Recommendations for Other Services       Precautions / Restrictions Precautions Precautions: Fall Precaution Comments: watch HR Restrictions Weight Bearing Restrictions: No      Mobility Bed Mobility Overal bed mobility: Modified  Independent             General bed mobility comments: Pt. seated in recliner upon entry.   Transfers Overall transfer level: Needs assistance   Transfers: Sit to/from Stand;Stand Pivot Transfers Sit to Stand: Min guard Stand pivot transfers: Min assist       General transfer comment: Min guard for safety and balance with STS transfers and Min A for SPT in small bathroom 2/2 low vision.    Balance Overall balance assessment: Needs assistance   Sitting balance-Leahy Scale: Good       Standing balance-Leahy Scale: Fair Standing balance comment: Pt. able to wash hands at sink level and reach soap outside of BOS.                            ADL either performed or assessed with clinical judgement   ADL Overall ADL's : Needs assistance/impaired     Grooming: Supervision/safety;Wash/dry hands;Wash/dry face;Standing Grooming Details (indicate cue type and reason): Pt. completed hand hygiene at sink level with supervision A for safety.  Upper Body Bathing: Supervision/ safety;Sitting   Lower Body Bathing: Supervison/ safety;Sit to/from stand   Upper Body Dressing : Supervision/safety;Standing   Lower Body Dressing: Supervision/safety;Sit to/from stand Lower Body Dressing Details (indicate cue type and reason): Pt. able to doff/don socks seated in standard chair with supervision A. Cues for figure-4 technique for safety.  Toilet Transfer: Min guard;Ambulation;Comfort height toilet;Grab bars Toilet Transfer Details (indicate cue type and reason): Min guard to maintain balance with transfer with patient reaching out for surfaces for stability.  Toileting- Water quality scientist and Hygiene: Min guard;Sit to/from stand Toileting - Clothing Manipulation Details (indicate cue type and reason): Toileting/hygiene/clothing management with Min guard for balance in  standing.      Functional mobility during ADLs: Min guard;Minimal assistance;Rolling walker General ADL  Comments: Min A without use of AD. Min guard with use of RW.      Vision Baseline Vision/History: Macular Degeneration Patient Visual Report: No change from baseline Vision Assessment?: Vision impaired- to be further tested in functional context     Perception     Praxis      Pertinent Vitals/Pain Pain Assessment: No/denies pain     Hand Dominance Right   Extremity/Trunk Assessment Upper Extremity Assessment Upper Extremity Assessment: Generalized weakness   Lower Extremity Assessment Lower Extremity Assessment: Generalized weakness   Cervical / Trunk Assessment Cervical / Trunk Assessment: Kyphotic   Communication Communication Communication: HOH;Other (comment) (macular degeneration with impaired vision)   Cognition Arousal/Alertness: Awake/alert Behavior During Therapy: WFL for tasks assessed/performed Overall Cognitive Status: Impaired/Different from baseline Area of Impairment: Safety/judgement                         Safety/Judgement: Decreased awareness of safety         General Comments       Exercises     Shoulder Instructions      Home Living Family/patient expects to be discharged to:: Private residence Living Arrangements: Alone Available Help at Discharge: Family;Available PRN/intermittently (Family friend stays overnight. ) Type of Home: House Home Access: Stairs to enter [employer] of Steps: 1 or 3 tends to use 3   Home Layout: One level     Bathroom Shower/Tub: Teacher, early years/pre: Handicapped height     Home Equipment: Tub bench          Prior Functioning/Environment Level of Independence: Independent        Comments: pt states she does the cleaning, bathes sitting down, does not use AD. Daughter makes meals she can microwave        OT Problem List: Decreased strength;Decreased activity tolerance;Impaired balance (sitting and/or standing);Impaired vision/perception;Decreased safety  awareness;Decreased cognition;Decreased knowledge of use of DME or AE      OT Treatment/Interventions: Self-care/ADL training;Therapeutic exercise;Energy conservation;DME and/or AE instruction;Therapeutic activities;Visual/perceptual remediation/compensation;Patient/family education;Balance training    OT Goals(Current goals can be found in the care plan section) Acute Rehab OT Goals Patient Stated Goal: To return home.  OT Goal Formulation: With patient Time For Goal Achievement: 12/30/19 Potential to Achieve Goals: Good ADL Goals Pt Will Perform Grooming: with modified independence;standing Pt Will Perform Lower Body Bathing: with modified independence;sit to/from stand Pt Will Perform Lower Body Dressing: with modified independence;sit to/from stand;sitting/lateral leans Pt Will Transfer to Toilet: with modified independence;ambulating;bedside commode Pt Will Perform Toileting - Clothing Manipulation and hygiene: with modified independence;sitting/lateral leans;sit to/from stand Pt Will Perform Tub/Shower Transfer: tub bench;grab bars;rolling walker Pt/caregiver will Perform Home Exercise Program: Both right and left upper extremity;Increased strength;Independently  OT Frequency: Min 3X/week   Barriers to D/C: Decreased caregiver support          Co-evaluation              AM-PAC OT "6 Clicks" Daily Activity     Outcome Measure Help from another person eating meals?: A Little Help from another person taking care of personal grooming?: A Little Help from another person toileting, which includes using toliet, bedpan, or urinal?: A Little Help from another person bathing (including washing, rinsing, drying)?: A Little Help from another person to put on and taking off regular upper body clothing?: A Little Help from  another person to put on and taking off regular lower body clothing?: A Little 6 Click Score: 18   End of Session Equipment Utilized During Treatment: Gait  belt  Activity Tolerance: Patient tolerated treatment well Patient left: in chair;with call bell/phone within reach;with chair alarm set  OT Visit Diagnosis: Unsteadiness on feet (R26.81);History of falling (Z91.81)                Time: 3143-8887 OT Time Calculation (min): 23 min Charges:  OT General Charges $OT Visit: 1 Visit OT Evaluation $OT Eval Low Complexity: 1 Low OT Treatments $Self Care/Home Management : 8-22 mins  Issaiah Seabrooks H. OTR/L Supplemental OT, Department of rehab services [phone]  Edoardo Laforte R H. 12/16/2019, 11:43 AM

## 2019-12-16 NOTE — Evaluation (Signed)
Physical Therapy Evaluation Patient Details Name: [name] MRN: [id number] DOB: January 19, 1924 Today's Date: 12/16/2019   History of Present Illness  86 yo admitted with weakness and dehydration. PMhx: CAD, aortic stenosis, pulm HTN, HTN, HLD, CHf, hypothyroidism, AFib  Clinical Impression  Pt very pleasant, HOH with impaired vision. Pt lives alone with a lady that stays at night and son checks in regularly. Pt does not use AD at home and reports only 1 fall. Pt with multiple knots on her head and states they are not from falls. Pt ok with the idea of potentially needing SNF placement but moves well and would do well with additional support in home. Pt with decreased strength, balance, safety and function who will benefit from acute therapy to maximize independence and decrease fall risk.   HR 120-140 with gait, 102 at rest    Follow Up Recommendations Supervision for mobility/OOB;Home health PT (HHPT If additional support can be arranged, ultimately ALF would be great)    Equipment Recommendations  Rolling walker with 5" wheels (youth)    Recommendations for Other Services OT consult     Precautions / Restrictions Precautions Precautions: Fall Precaution Comments: watch HR Restrictions Weight Bearing Restrictions: No      Mobility  Bed Mobility Overal bed mobility: Modified Independent                Transfers Overall transfer level: Needs assistance   Transfers: Sit to/from Stand Sit to Stand: Min guard         General transfer comment: guarding for safety with limited vision  Ambulation/Gait Ambulation/Gait assistance: Min guard Gait Distance (Feet): 300 Feet Assistive device: Rolling walker (2 wheeled);None Gait Pattern/deviations: Step-through pattern;Decreased stride length;Trunk flexed;Wide base of support   Gait velocity interpretation: 1.31 - 2.62 ft/sec, indicative of limited community ambulator General Gait Details: Pt initiated gait with  shuffling wide gait with flexed trunk. Pt unsteady without RW then initiated RW after 15' with cues for proximity and posture with significantly improved stride and stability  Stairs            Wheelchair Mobility    Modified Rankin (Stroke Patients Only)       Balance Overall balance assessment: Needs assistance   Sitting balance-Leahy Scale: Good       Standing balance-Leahy Scale: Fair Standing balance comment: able to stand without support but needs UE support to adjust underwear and walk                             Pertinent Vitals/Pain Pain Assessment: No/denies pain    Home Living Family/patient expects to be discharged to:: Private residence Living Arrangements: Alone Available Help at Discharge: Family;Available PRN/intermittently Type of Home: House Home Access: Stairs to enter   [employer] of Steps: 1 or 3 tends to use 3 Home Layout: One level Home Equipment: Tub bench      Prior Function Level of Independence: Independent         Comments: pt states she does the cleaning, bathes sitting down, does not use AD. Daughter makes meals she can microwave     Hand Dominance        Extremity/Trunk Assessment   Upper Extremity Assessment Upper Extremity Assessment: Generalized weakness    Lower Extremity Assessment Lower Extremity Assessment: Generalized weakness    Cervical / Trunk Assessment Cervical / Trunk Assessment: Kyphotic  Communication   Communication: HOH;Other (comment) (macular degeneration with impaired vision)  Cognition Arousal/Alertness: Awake/alert Behavior During Therapy: WFL for tasks assessed/performed Overall Cognitive Status: Impaired/Different from baseline Area of Impairment: Safety/judgement                         Safety/Judgement: Decreased awareness of safety            General Comments      Exercises     Assessment/Plan    PT Assessment Patient needs continued PT  services  PT Problem List Decreased strength;Decreased mobility;Decreased safety awareness;Decreased activity tolerance;Decreased balance;Decreased knowledge of use of DME;Decreased cognition       PT Treatment Interventions DME instruction;Therapeutic exercise;Gait training;Stair training;Functional mobility training;Therapeutic activities;Patient/family education;Balance training    PT Goals (Current goals can be found in the Care Plan section)  Acute Rehab PT Goals Patient Stated Goal: return home, knitting PT Goal Formulation: With patient Time For Goal Achievement: 12/30/19 Potential to Achieve Goals: Good    Frequency Min 3X/week   Barriers to discharge Decreased caregiver support      Co-evaluation               AM-PAC PT "6 Clicks" Mobility  Outcome Measure Help needed turning from your back to your side while in a flat bed without using bedrails?: None Help needed moving from lying on your back to sitting on the side of a flat bed without using bedrails?: None Help needed moving to and from a bed to a chair (including a wheelchair)?: None Help needed standing up from a chair using your arms (e.g., wheelchair or bedside chair)?: None Help needed to walk in hospital room?: A Little Help needed climbing 3-5 steps with a railing? : A Little 6 Click Score: 22    End of Session Equipment Utilized During Treatment: Gait belt Activity Tolerance: Patient tolerated treatment well Patient left: in chair;with call bell/phone within reach;with chair alarm set Nurse Communication: Mobility status PT Visit Diagnosis: Other abnormalities of gait and mobility (R26.89);Muscle weakness (generalized) (M62.81)    Time: 1010-1039 PT Time Calculation (min) (ACUTE ONLY): 29 min   Charges:   PT Evaluation $PT Eval Moderate Complexity: 1 Mod PT Treatments $Therapeutic Activity: 8-22 mins        [name], PT Acute Rehabilitation Services Pager: [phone] Office:  [phone]   [name] 12/16/2019, 10:46 AM

## 2019-12-16 NOTE — ED Provider Notes (Signed)
[facility] 4E CV SURGICAL PROGRESSIVE CARE Provider Note   CSN: [id number] Arrival date & time: 12/15/19  0349     History Chief Complaint  Patient presents with  . Weakness    [name] is a 86 y.o. female.  HPI Patient lives home independently.  Patient denies any complaints.  She reports her son is concerned about her.  Per EMS report sinus concern for 6 days of significantly decreased appetite and generalized weakness.  Patient reports that she did fall yesterday evening.  She reports she landed on her buttocks and does not have any associated pain.  She denies that she struck her head.  She denies she been having problems with chest pain or shortness of breath.  She denies headaches.  She reports she does not have much appetite but is not because of being in pain or nauseated.    Past Medical History:  Diagnosis Date  .  Acute respiratiory failure requiring BiPap 06/24/2011  . Allergy   . Aortic valvular stenosis, moderate to severe 06/24/2011   a. 10/2012 Echo: mod AS, Valve 1.25 cm^2 (VTI), 1.19cm^2 (Vmax).  . Arthritis    "back" (10/30/2012)  . Chronic diastolic CHF (congestive heart failure) ([place])    a. 10/2012 Echo: EF 55-60%, no rwma, Gr 2 DD, moderate AS, mod dil LA, mildly to mod dil RA.  Marland Kitchen Coronary artery disease    a. 03/2009 PCI RCA (3.0x18 BMS).  . Dyslipidemia   . Exertional shortness of breath   . Hypertension   . Hypothyroidism   . PAF (paroxysmal atrial fibrillation) ([place])    a. CHA2DS2VASc = 7-->chronic coumadin;  b. s/p DCCV 05/2011, 05/2012, 07/2014;  c. On amio.  . Rash    a. felt to be 2/2 lasix/torsemide in setting of sulfa allergy (lasix d/c ~ 06/2014, torsemide d/c 08/09/2014).    Patient Active Problem List   Diagnosis Date Noted  . Generalized weakness 12/15/2019  . Acute on chronic kidney failure ([place]) 12/15/2019  . Hypercalcemia 12/15/2019  . Advanced nonexudative age-related macular degeneration of both eyes with subfoveal involvement 11/22/2019    . Minimal cognitive impairment 08/03/2019  . Permanent atrial fibrillation ([place]) 06/14/2019  . Atrial flutter ([place]) 06/01/2019  . Drug allergy   . Acute on chronic diastolic CHF (congestive heart failure) ([place]) 05/30/2019  . Atrial fibrillation with RVR ([place]) 04/25/2018  . CKD (chronic kidney disease) stage 3, GFR 30-59 ml/min 04/25/2018  . Anemia 04/25/2018  . Supratherapeutic INR 04/25/2018  . Acute gastroenteritis 12/25/2017  . Aspiration pneumonia of left lower lobe due to vomit ([place]) 12/25/2017  . Allergy to sulfa drugs 10/02/2016  . History of fracture of right hip 04/15/2015  . Senile purpura ([place]) 04/13/2015  . Angioimmunoblastic lymphoma ([place]) 04/05/2015  . Arthritis 09/13/2014  . Intermittent LBBB 08/16/2014  . Drug-induced bradycardia 02/20/2014  . Prolonged Q-T interval on ECG 11/02/2012  . Hypotension 11/01/2012  . Leukocytosis 11/01/2012  . Pleural effusion 10/11/2011  . Aortic valvular stenosis, moderate to severe 06/24/2011  . Essential hypertension 06/24/2011    Class: Present on Admission  . Hyperlipidemia 06/21/2011  . Hypertensive heart disease with CHF (congestive heart failure) ([place]) 06/21/2011  . Hypothyroidism, goitrous 06/21/2011  . Coronary artery disease 06/21/2011    Past Surgical History:  Procedure Laterality Date  . ANGIOPLASTY    . APPENDECTOMY    . BACK SURGERY    . CARDIAC CATHETERIZATION  10/06/2009   Continue medical therapy  . CARDIAC CATHETERIZATION  04/07/2009  RCA stented with a 3x61mm stent resulting in a reduction of 90% narrowing to normal  . CARDIOVERSION  06/19/2011   Procedure: CARDIOVERSION;  Surgeon: Sanda Klein, MD;  Location: [facility];  Service: Cardiovascular;  Laterality: N/A;  . CARDIOVERSION  07/11/2010   Successful coversion to sinus rhythm  . CARDIOVERSION N/A 08/10/2014   Procedure: CARDIOVERSION;  Surgeon: Sanda Klein, MD;  Location: [facility];  Service: Cardiovascular;  Laterality: N/A;  . CATARACT  EXTRACTION W/ INTRAOCULAR LENS  IMPLANT, BILATERAL    . CORONARY ANGIOPLASTY WITH STENT PLACEMENT     "1" (10/30/2012)  . DILATION AND CURETTAGE OF UTERUS    . EXCISIONAL HEMORRHOIDECTOMY    . FRACTURE SURGERY    . HIP ARTHROPLASTY Right 04/17/2015   Procedure: HIP MONOPOLAR HIP HEMI ARTHROPLASTY;  Surgeon: Marybelle Killings, MD;  Location: [facility];  Service: Orthopedics;  Laterality: Right;  . IR THORACENTESIS ASP PLEURAL SPACE W/IMG GUIDE  04/28/2018  . IR THORACENTESIS ASP PLEURAL SPACE W/IMG GUIDE  05/26/2018  . LUMBAR DISC SURGERY  X 2   "cleaned out scar tissue"   . SHOULDER ACROMIOPLASTY Left    "fell; put a new ball in" (10/30/2012)  . WRIST FRACTURE SURGERY Left      OB History   No obstetric history on file.     Family History  Problem Relation Age of Onset  . Heart disease Brother   . Heart disease Brother   . Lung disease Brother   . Heart disease Mother 73  . Cancer Mother 8       Uterine  . Stroke Mother 43  . Heart failure Mother   . Hypertension Mother   . Heart disease Father   . Heart attack Father   . Hypertension Father   . Hypertension Child 34  . Alcohol abuse Child   . Cancer Son   . Hypertension Sister   . Hypertension Brother     Social History   Tobacco Use  . Smoking status: Never Smoker  . Smokeless tobacco: Never Used  Substance Use Topics  . Alcohol use: No  . Drug use: No    Home Medications Prior to Admission medications   Medication Sig Start Date End Date Taking? Authorizing Provider  acetaminophen (TYLENOL) 650 MG CR tablet Take 650 mg by mouth 3 (three) times daily. Morning, supper and bedtime   Yes [provider]  atorvastatin (LIPITOR) 40 MG tablet TAKE 1 TABLET DAILY AT 6 P.M. Patient taking differently: Take 40 mg by mouth daily at 6 PM.  02/19/19  Yes Croitoru, Mihai, MD  benzonatate (TESSALON) 100 MG capsule Take 100 mg by mouth at bedtime.   Yes [provider]  Cholecalciferol (VITAMIN D) 50 MCG (2000 UT)  tablet Take 2,000 Units by mouth daily.   Yes [provider]  diphenhydramine-acetaminophen (ACETAMINOPHEN PM) 25-500 MG TABS tablet Take 1 tablet by mouth at bedtime.    Yes [provider]  ezetimibe (ZETIA) 10 MG tablet TAKE 1 TABLET(10 MG) BY MOUTH DAILY Patient taking differently: Take 10 mg by mouth daily.  09/27/19  Yes [name], MD  folic acid (FOLVITE) 1 MG tablet TAKE 2 TABLETS(2 MG) BY MOUTH DAILY Patient taking differently: Take 2 mg by mouth daily.  09/28/19  Yes [name], MD  levothyroxine (SYNTHROID) 75 MCG tablet Take 1 tablet (75 mcg total) by mouth daily. 12/03/19  Yes [name], MD  loratadine (CLARITIN) 10 MG tablet Take 10 mg by  mouth daily.   Yes [provider]  metoprolol tartrate (LOPRESSOR) 25 MG tablet Take 1 tablet (25 mg total) by mouth 2 (two) times daily. 08/05/19  Yes [name], MD  multivitamin-iron-minerals-folic acid (CENTRUM) chewable tablet Chew 1 tablet by mouth daily.   Yes [provider]  spironolactone (ALDACTONE) 25 MG tablet TAKE 1 TABLET(25 MG) BY MOUTH DAILY Patient taking differently: Take 25 mg by mouth daily.  09/28/19  Yes [name], MD  triamcinolone cream (KENALOG) 0.1 % APPLY CREAM EXTERNALLY TO AFFECTED AREA TWICE DAILY AS NEEDED (NOT FACE GROIN OR UNDERARMS) Patient taking differently: Apply 1 application topically 2 (two) times daily as needed (back rash). APPLY CREAM EXTERNALLY TO AFFECTED AREA TWICE DAILY AS NEEDED (NOT FACE GROIN OR UNDERARMS) 08/05/19  Yes [name], MD  vitamin C (ASCORBIC ACID) 500 MG tablet Take 500 mg by mouth daily.   Yes [provider]  bumetanide (BUMEX) 1 MG tablet Take 1 tablet (1 mg total) by mouth daily as needed (for wt gain of 2 pounds in a day or 5 pounds in a week, swelling or shortness of breath). 08/05/19 08/04/20  [name], MD  ipratropium (ATROVENT) 0.03 % nasal spray Place 2 sprays into both nostrils 3 (three) times daily as  needed for rhinitis. Patient not taking: Reported on 12/15/2019 08/05/19   [name], MD  magnesium hydroxide (MILK OF MAGNESIA) 400 MG/5ML suspension Take 15 mLs by mouth daily as needed for mild constipation. Patient not taking: Reported on 12/15/2019 10/08/16   Lavina Hamman, MD    Allergies    [name] [ethacrynic acid], Red dye, Furosemide, Sulfa antibiotics, Tape, [name], and Torsemide  Review of Systems   Review of Systems 10 systems reviewed and negative except as per HPI Physical Exam Updated Vital Signs BP (!) 105/57 (BP Location: Left Arm)   Pulse (!) 115   Temp 97.6 F (36.4 C) (Oral)   Resp 16   Ht 5\' 3"  (1.6 m)   Wt 52.8 kg   SpO2 96%   BMI 20.62 kg/m   Physical Exam Constitutional:      Comments: Alert, nontoxic.  Clear mental status.  No respiratory distress.  HENT:     Head:     Comments: 2 cm purpleish well-circumscribed nodule on forehead on the right.  Spongy.  No hematomas or abrasions.    Mouth/Throat:     Pharynx: Oropharynx is clear.  Eyes:     Extraocular Movements: Extraocular movements intact.     Pupils: Pupils are equal, round, and reactive to light.  Cardiovascular:     Rate and Rhythm: Normal rate and regular rhythm.  Pulmonary:     Effort: Pulmonary effort is normal.     Breath sounds: Normal breath sounds.  Abdominal:     General: There is no distension.     Palpations: Abdomen is soft.     Tenderness: There is no abdominal tenderness. There is no guarding.  Musculoskeletal:        General: No swelling or tenderness. Normal range of motion.     Cervical back: Neck supple.     Right lower leg: No edema.     Left lower leg: No edema.  Skin:    General: Skin is warm and dry.  Neurological:     General: No focal deficit present.     Mental Status: She is oriented to person, place, and time.     Cranial Nerves: No  cranial nerve deficit.     Coordination: Coordination normal.  Psychiatric:        Mood and Affect:  Mood normal.     ED Results / Procedures / Treatments   Labs (all labs ordered are listed, but only abnormal results are displayed) Labs Reviewed  COMPREHENSIVE METABOLIC PANEL - Abnormal; Notable for the following components:      Result Value   BUN 60 (*)    Creatinine, Ser 2.34 (*)    Calcium 14.0 (*)    Albumin 3.4 (*)    Total Bilirubin 1.8 (*)    GFR calc non Af Amer 17 (*)    GFR calc Af Amer 20 (*)    All other components within normal limits  CBC WITH DIFFERENTIAL/PLATELET - Abnormal; Notable for the following components:   Hemoglobin 11.0 (*)    HCT 35.8 (*)    Basophils Absolute 0.2 (*)    All other components within normal limits  URINALYSIS, ROUTINE W REFLEX MICROSCOPIC - Abnormal; Notable for the following components:   APPearance TURBID (*)    All other components within normal limits  CBC - Abnormal; Notable for the following components:   Hemoglobin 11.2 (*)    All other components within normal limits  CREATININE, SERUM - Abnormal; Notable for the following components:   Creatinine, Ser 2.35 (*)    GFR calc non Af Amer 17 (*)    GFR calc Af Amer 20 (*)    All other components within normal limits  MAGNESIUM - Abnormal; Notable for the following components:   Magnesium 2.6 (*)    All other components within normal limits  TSH - Abnormal; Notable for the following components:   TSH 0.189 (*)    All other components within normal limits  PTH, INTACT AND CALCIUM - Abnormal; Notable for the following components:   PTH 9 (*)    Calcium, Total (PTH) 13.8 (*)    All other components within normal limits  COMPREHENSIVE METABOLIC PANEL - Abnormal; Notable for the following components:   BUN 61 (*)    Creatinine, Ser 2.74 (*)    Calcium 12.6 (*)    Total Protein 5.6 (*)    Albumin 2.8 (*)    Total Bilirubin 1.4 (*)    GFR calc non Af Amer 14 (*)    GFR calc Af Amer 17 (*)    All other components within normal limits  CBC - Abnormal; Notable for the  following components:   RBC 3.41 (*)    Hemoglobin 10.0 (*)    HCT 32.2 (*)    All other components within normal limits  TROPONIN I (HIGH SENSITIVITY) - Abnormal; Notable for the following components:   Troponin I (High Sensitivity) 28 (*)    All other components within normal limits  TROPONIN I (HIGH SENSITIVITY) - Abnormal; Notable for the following components:   Troponin I (High Sensitivity) 27 (*)    All other components within normal limits  SARS CORONAVIRUS 2 BY RT PCR (HOSPITAL ORDER, [place] [facility])  CULTURE, BLOOD (ROUTINE X 2)  CULTURE, BLOOD (ROUTINE X 2)  LACTIC ACID, PLASMA  LACTIC ACID, PLASMA  PROTIME-INR  PHOSPHORUS  PROCALCITONIN  VITAMIN D 25 HYDROXY (VIT D DEFICIENCY, FRACTURES)  CALCIUM, IONIZED  PROTEIN ELECTROPHORESIS, SERUM  UPEP/UIFE/LIGHT CHAINS/TP, 24-HR UR  T4, FREE    EKG EKG Interpretation  Date/Time:  Wednesday December 15 2019 09:54:26 EDT Ventricular Rate:  99 PR Interval:  QRS Duration: 147 QT Interval:  372 QTC Calculation: 478 R Axis:   -64 Text Interpretation: Atrial flutter with varied AV block, Left bundle branch block Baseline wander in lead(s) III agree, similar to previous except lateral st depression Confirmed by Charlesetta Shanks [phone]) on 12/15/2019 1:03:48 PM   Radiology CT Head Wo Contrast  Result Date: 12/15/2019 CLINICAL DATA:  Dizziness EXAM: CT HEAD WITHOUT CONTRAST TECHNIQUE: Contiguous axial images were obtained from the base of the skull through the vertex without intravenous contrast. COMPARISON:  None. FINDINGS: Brain: There is no acute intracranial hemorrhage, mass effect, or edema. No acute appearing loss of gray-white differentiation. There is no extra-axial fluid collection. Chronic infarction involving primarily the right parietotemporal lobes and insula patchy and confluent areas of hypoattenuation in the supratentorial white matter are nonspecific but probably reflect moderate chronic  microvascular ischemic changes. Prominence of the ventricles and sulci reflects generalized parenchymal volume loss. Vascular: There is atherosclerotic calcification at the skull base. Skull: Calvarium is unremarkable. Sinuses/Orbits: No acute finding. Other: Few scalp soft tissue masses, largest in the lateral right frontal region. IMPRESSION: No acute intracranial abnormality. Chronic right MCA territory infarction. Moderate chronic microvascular ischemic changes. Several scalp soft tissue masses, largest in the right lateral frontal region. Please correlate with history and exam. Electronically Signed   By: Macy Mis M.D.   On: 12/15/2019 13:07   CT CHEST WO CONTRAST  Result Date: 12/15/2019 CLINICAL DATA:  Abnormal x-ray. Lung opacities. Generalized weakness for 6 days. Decreased appetite. EXAM: CT CHEST WITHOUT CONTRAST TECHNIQUE: Multidetector CT imaging of the chest was performed following the standard protocol without IV contrast. COMPARISON:  12/15/2019 FINDINGS: Cardiovascular: Heart is mildly enlarged. There is no pericardial effusion. There is extensive atherosclerotic calcification of the coronary arteries and mitral annulus. Extensive atherosclerotic calcification of the thoracic aorta is present. No associated aneurysm. Mediastinum/Nodes: The visualized portion of the thyroid gland has a normal appearance. Pretracheal lymph node is 1.1 centimeters. Precarinal lymph node is 1.0 centimeters. No significant hilar adenopathy. Esophagus is unremarkable. Lungs/Pleura: There are numerous nodules throughout the lungs. Nodules involve all lobes and measure from 3 millimeters to 2.0 cm. A 2.0 centimeter nodule in the LEFT UPPER lobe on image 78 of series 4.) there are small bilateral pleural effusions, LEFT greater than RIGHT. Bibasilar atelectasis is present. No airspace filling opacities to indicate infectious process. Upper Abdomen: Noncontrast appearance of the liver is unremarkable. No UPPER  abdominal abnormality. Musculoskeletal: Moderate degenerative changes are seen throughout the thoracic spine. No suspicious lytic or blastic lesions are identified. Remote rib fractures. Remote LEFT shoulder arthroplasty. IMPRESSION: 1. Numerous bilateral pulmonary nodules, suspicious for metastatic disease. 2. Small bilateral pleural effusions, LEFT greater than RIGHT. 3. Cardiomegaly and coronary artery disease. 4. Aortic Atherosclerosis (ICD10-I70.0). Electronically Signed   By: Nolon Nations M.D.   On: 12/15/2019 16:51   DG Chest Port 1 View  Result Date: 12/15/2019 CLINICAL DATA:  Generalized weakness. EXAM: PORTABLE CHEST 1 VIEW COMPARISON:  05/30/2019 FINDINGS: The cardio pericardial silhouette is enlarged. Interstitial markings are diffusely coarsened with chronic features. Patchy/nodular airspace disease is seen in the mid and lower lungs bilaterally. Left base collapse/consolidation with small left pleural effusion again noted. Bones are diffusely demineralized. Status post left shoulder replacement. Telemetry leads overlie the chest. IMPRESSION: 1. Patchy/nodular airspace disease in the mid and lower lungs bilaterally. Imaging features may be related to pulmonary edema although multifocal pneumonia a concern. Given nodular character in some regions, chest CT may be warranted to  further evaluate. 2. Left base collapse/consolidation with small left pleural effusion, similar to prior. Electronically Signed   By: Misty Stanley M.D.   On: 12/15/2019 10:36    Procedures Procedures (including critical care time)  Medications Ordered in ED Medications  enoxaparin (LOVENOX) injection 30 mg (30 mg Subcutaneous Given 12/15/19 1441)  0.9 %  sodium chloride infusion ( Intravenous Restarted 12/16/19 0015)  acetaminophen (TYLENOL) tablet 650 mg (has no administration in time range)    Or  acetaminophen (TYLENOL) suppository 650 mg (has no administration in time range)  atorvastatin (LIPITOR) tablet 40  mg (40 mg Oral Given 12/15/19 1858)  ezetimibe (ZETIA) tablet 10 mg (10 mg Oral Not Given 7/90/38 3338)  folic acid (FOLVITE) tablet 2 mg (2 mg Oral Given 12/15/19 1442)  cefTRIAXone (ROCEPHIN) 1 g in sodium chloride 0.9 % 100 mL IVPB (0 g Intravenous Stopped 12/15/19 1635)    ED Course  I have reviewed the triage vital signs and the nursing notes.  Pertinent labs & imaging results that were available during my care of the patient were reviewed by me and considered in my medical decision making (see chart for details).    MDM Rules/Calculators/A&P                         Consult: [facility] hospitalist Dr. Doristine Bosworth for admission.  Patient presents with a complaint of generalized weakness and a nontraumatic fall yesterday.  Patient's mental status is excellent.  She is alert and interactive with good cognitive function.  No focal neurologic deficits.  Broad work-up initiated for weakness in elderly patient.  Chest x-ray shows bilateral patchy infiltrate.  Patient has new renal insufficiency and severe hyperkalemia.  Will require additional diagnostic evaluation and treatment.  Admit to medical service.   Final Clinical Impression(s) / ED Diagnoses Final diagnoses:  Generalized weakness  AKI (acute kidney injury) ([place])  Hypercalcemia  Abnormal chest x-ray with multiple lung nodules    Rx / DC Orders ED Discharge Orders    None       Charlesetta Shanks, MD 12/16/19 [phone]

## 2019-12-17 ENCOUNTER — Inpatient Hospital Stay (HOSPITAL_COMMUNITY): Payer: Medicare Other

## 2019-12-17 DIAGNOSIS — I350 Nonrheumatic aortic (valve) stenosis: Secondary | ICD-10-CM | POA: Diagnosis not present

## 2019-12-17 DIAGNOSIS — R531 Weakness: Secondary | ICD-10-CM | POA: Diagnosis not present

## 2019-12-17 DIAGNOSIS — I10 Essential (primary) hypertension: Secondary | ICD-10-CM | POA: Diagnosis not present

## 2019-12-17 DIAGNOSIS — N179 Acute kidney failure, unspecified: Secondary | ICD-10-CM | POA: Diagnosis not present

## 2019-12-17 LAB — COMPREHENSIVE METABOLIC PANEL
ALT: 17 U/L (ref 0–44)
AST: 25 U/L (ref 15–41)
Albumin: 2.6 g/dL — ABNORMAL LOW (ref 3.5–5.0)
Alkaline Phosphatase: 82 U/L (ref 38–126)
Anion gap: 8 (ref 5–15)
BUN: 49 mg/dL — ABNORMAL HIGH (ref 8–23)
CO2: 22 mmol/L (ref 22–32)
Calcium: 11.8 mg/dL — ABNORMAL HIGH (ref 8.9–10.3)
Chloride: 113 mmol/L — ABNORMAL HIGH (ref 98–111)
Creatinine, Ser: 2.42 mg/dL — ABNORMAL HIGH (ref 0.44–1.00)
GFR calc Af Amer: 19 mL/min — ABNORMAL LOW (ref >60)
GFR calc non Af Amer: 17 mL/min — ABNORMAL LOW (ref >60)
Glucose, Bld: 103 mg/dL — ABNORMAL HIGH (ref 70–99)
Potassium: 3.9 mmol/L (ref 3.5–5.1)
Sodium: 143 mmol/L (ref 135–145)
Total Bilirubin: 1.3 mg/dL — ABNORMAL HIGH (ref 0.3–1.2)
Total Protein: 5.5 g/dL — ABNORMAL LOW (ref 6.5–8.1)

## 2019-12-17 LAB — CBC WITH DIFFERENTIAL/PLATELET
Abs Immature Granulocytes: 0.03 10*3/uL (ref 0.00–0.07)
Basophils Absolute: 0.1 10*3/uL (ref 0.0–0.1)
Basophils Relative: 1 %
Eosinophils Absolute: 0.2 10*3/uL (ref 0.0–0.5)
Eosinophils Relative: 3 %
HCT: 32.9 % — ABNORMAL LOW (ref 36.0–46.0)
Hemoglobin: 10 g/dL — ABNORMAL LOW (ref 12.0–15.0)
Immature Granulocytes: 0 %
Lymphocytes Relative: 12 %
Lymphs Abs: 1 10*3/uL (ref 0.7–4.0)
MCH: 29.3 pg (ref 26.0–34.0)
MCHC: 30.4 g/dL (ref 30.0–36.0)
MCV: 96.5 fL (ref 80.0–100.0)
Monocytes Absolute: 0.9 10*3/uL (ref 0.1–1.0)
Monocytes Relative: 11 %
Neutro Abs: 5.8 10*3/uL (ref 1.7–7.7)
Neutrophils Relative %: 73 %
Platelets: 164 10*3/uL (ref 150–400)
RBC: 3.41 MIL/uL — ABNORMAL LOW (ref 3.87–5.11)
RDW: 14.6 % (ref 11.5–15.5)
WBC: 8 10*3/uL (ref 4.0–10.5)
nRBC: 0 % (ref 0.0–0.2)

## 2019-12-17 LAB — LACTATE DEHYDROGENASE: LDH: 405 U/L — ABNORMAL HIGH (ref 98–192)

## 2019-12-17 MED ORDER — DIPHENHYDRAMINE HCL 25 MG PO CAPS
25.0000 mg | ORAL_CAPSULE | Freq: Every evening | ORAL | Status: DC | PRN
  Administered 2019-12-17 – 2019-12-21 (×3): 25 mg via ORAL
  Filled 2019-12-17 (×3): qty 1

## 2019-12-17 MED ORDER — POLYETHYLENE GLYCOL 3350 17 G PO PACK
17.0000 g | PACK | Freq: Every day | ORAL | Status: DC
  Administered 2019-12-18: 17 g via ORAL
  Filled 2019-12-17 (×2): qty 1

## 2019-12-17 MED ORDER — APIXABAN 2.5 MG PO TABS
2.5000 mg | ORAL_TABLET | Freq: Two times a day (BID) | ORAL | Status: DC
  Administered 2019-12-17 – 2019-12-23 (×12): 2.5 mg via ORAL
  Filled 2019-12-17 (×12): qty 1

## 2019-12-17 MED ORDER — SENNA 8.6 MG PO TABS
1.0000 | ORAL_TABLET | Freq: Every day | ORAL | Status: DC
  Administered 2019-12-17 – 2019-12-18 (×2): 8.6 mg via ORAL
  Filled 2019-12-17 (×2): qty 1

## 2019-12-17 MED ORDER — METOPROLOL TARTRATE 25 MG PO TABS
25.0000 mg | ORAL_TABLET | Freq: Two times a day (BID) | ORAL | Status: DC
  Administered 2019-12-17: 25 mg via ORAL
  Filled 2019-12-17: qty 1

## 2019-12-17 MED ORDER — RESOURCE THICKENUP CLEAR PO POWD
ORAL | Status: DC | PRN
  Filled 2019-12-17: qty 125

## 2019-12-17 NOTE — Discharge Instructions (Signed)

## 2019-12-17 NOTE — Progress Notes (Signed)
ANTICOAGULATION CONSULT NOTE - Initial Consult  Pharmacy Consult for apixaban  Indication: atrial fibrillation  Allergies  Allergen Reactions  . [name] [Ethacrynic Acid] Nausea And Vomiting  . Red Dye   . Furosemide Rash    Tolerates Bumex  . Sulfa Antibiotics Itching and Rash  . Tape Rash and Other (See Comments)    Tears skin off.  Please use "paper" tape only.   . [name] Rash    Per Dr Sallyanne Kuster, had a skin reaction with thiazides prior to use of Lasix  . Torsemide Rash    Tolerates Bumex    Patient Measurements: Height: 5\' 3"  (160 cm) Weight: 55.7 kg (122 lb 12.7 oz) IBW/kg (Calculated) : 52.4 Heparin Dosing Weight:   Vital Signs: Temp: 98.6 F (37 C) (07/23 1644) Temp Source: Oral (07/23 1644) BP: 110/70 (07/23 1644) Pulse Rate: 118 (07/23 1644)  Labs: Recent Labs    12/15/19 1005 12/15/19 1005 12/15/19 1154 12/15/19 1422 12/15/19 1422 12/16/19 0229 12/17/19 0257  HGB 11.0*   < >  --  11.2*   < > 10.0* 10.0*  HCT 35.8*   < >  --  36.7  --  32.2* 32.9*  PLT 183   < >  --  192  --  178 164  LABPROT 15.2  --   --   --   --   --   --   INR 1.2  --   --   --   --   --   --   CREATININE 2.34*   < >  --  2.35*  --  2.74* 2.42*  TROPONINIHS 28*  --  27*  --   --   --   --    < > = values in this interval not displayed.    Estimated Creatinine Clearance: 11.8 mL/min (A) (by C-G formula based on SCr of 2.42 mg/dL (H)).   Medical History: Past Medical History:  Diagnosis Date  .  Acute respiratiory failure requiring BiPap 06/24/2011  . Allergy   . Aortic valvular stenosis, moderate to severe 06/24/2011   a. 10/2012 Echo: mod AS, Valve 1.25 cm^2 (VTI), 1.19cm^2 (Vmax).  . Arthritis    "back" (10/30/2012)  . Chronic diastolic CHF (congestive heart failure) ([place])    a. 10/2012 Echo: EF 55-60%, no rwma, Gr 2 DD, moderate AS, mod dil LA, mildly to mod dil RA.  Marland Kitchen Coronary artery disease    a. 03/2009 PCI RCA (3.0x18 BMS).  . Dyslipidemia   .  Exertional shortness of breath   . Hypertension   . Hypothyroidism   . PAF (paroxysmal atrial fibrillation) ([place])    a. CHA2DS2VASc = 7-->chronic coumadin;  b. s/p DCCV 05/2011, 05/2012, 07/2014;  c. On amio.  . Rash    a. felt to be 2/2 lasix/torsemide in setting of sulfa allergy (lasix d/c ~ 06/2014, torsemide d/c 08/09/2014).    Medications:  Medications Prior to Admission  Medication Sig Dispense Refill Last Dose  . acetaminophen (TYLENOL) 650 MG CR tablet Take 650 mg by mouth 3 (three) times daily. Morning, supper and bedtime   12/14/2019 at Unknown time  . atorvastatin (LIPITOR) 40 MG tablet TAKE 1 TABLET DAILY AT 6 P.M. (Patient taking differently: Take 40 mg by mouth daily at 6 PM. ) 90 tablet 3 12/14/2019 at Unknown time  . benzonatate (TESSALON) 100 MG capsule Take 100 mg by mouth at bedtime.   12/14/2019 at Unknown time  . Cholecalciferol (VITAMIN D) 50 MCG (  2000 UT) tablet Take 2,000 Units by mouth daily.   12/14/2019 at Unknown time  . diphenhydramine-acetaminophen (ACETAMINOPHEN PM) 25-500 MG TABS tablet Take 1 tablet by mouth at bedtime.    12/14/2019 at Unknown time  . ezetimibe (ZETIA) 10 MG tablet TAKE 1 TABLET(10 MG) BY MOUTH DAILY (Patient taking differently: Take 10 mg by mouth daily. ) 90 tablet 1 12/14/2019 at Unknown time  . folic acid (FOLVITE) 1 MG tablet TAKE 2 TABLETS(2 MG) BY MOUTH DAILY (Patient taking differently: Take 2 mg by mouth daily. ) 90 tablet 1 12/14/2019 at Unknown time  . levothyroxine (SYNTHROID) 75 MCG tablet Take 1 tablet (75 mcg total) by mouth daily. 90 tablet 3 12/15/2019 at Unknown time  . loratadine (CLARITIN) 10 MG tablet Take 10 mg by mouth daily.   12/14/2019 at Unknown time  . metoprolol tartrate (LOPRESSOR) 25 MG tablet Take 1 tablet (25 mg total) by mouth 2 (two) times daily. 180 tablet 2 12/14/2019 at 6pm  . multivitamin-iron-minerals-folic acid (CENTRUM) chewable tablet Chew 1 tablet by mouth daily.   12/14/2019 at Unknown time  . spironolactone  (ALDACTONE) 25 MG tablet TAKE 1 TABLET(25 MG) BY MOUTH DAILY (Patient taking differently: Take 25 mg by mouth daily. ) 90 tablet 1 12/14/2019 at Unknown time  . triamcinolone cream (KENALOG) 0.1 % APPLY CREAM EXTERNALLY TO AFFECTED AREA TWICE DAILY AS NEEDED (NOT FACE GROIN OR UNDERARMS) (Patient taking differently: Apply 1 application topically 2 (two) times daily as needed (back rash). APPLY CREAM EXTERNALLY TO AFFECTED AREA TWICE DAILY AS NEEDED (NOT FACE GROIN OR UNDERARMS)) 30 g 1 Past Month at Unknown time  . vitamin C (ASCORBIC ACID) 500 MG tablet Take 500 mg by mouth daily.   12/14/2019 at Unknown time  . bumetanide (BUMEX) 1 MG tablet Take 1 tablet (1 mg total) by mouth daily as needed (for wt gain of 2 pounds in a day or 5 pounds in a week, swelling or shortness of breath). 30 tablet 11   . ipratropium (ATROVENT) 0.03 % nasal spray Place 2 sprays into both nostrils 3 (three) times daily as needed for rhinitis. (Patient not taking: Reported on 12/15/2019) 30 mL 0 Not Taking at Unknown time  . magnesium hydroxide (MILK OF MAGNESIA) 400 MG/5ML suspension Take 15 mLs by mouth daily as needed for mild constipation. (Patient not taking: Reported on 12/15/2019) 360 mL 0 Not Taking at Unknown time   Scheduled:  . atorvastatin  40 mg Oral q1800  . enoxaparin (LOVENOX) injection  30 mg Subcutaneous Q24H  . ezetimibe  10 mg Oral Daily  . folic acid  2 mg Oral Daily  . metoprolol tartrate  25 mg Oral BID  . polyethylene glycol  17 g Oral Daily  . senna  1 tablet Oral Daily   Infusions:    Assessment: Pt with a hx of PAF who has been on apixaban for anticoag until it was recently held to r/o bleed. Head CT did not show intracranial hemorrhage. Apixaban has been ordered to resume.  Age>90, wt<60kg, scr>1.5 Hgb 10  Goal of Therapy:  Monitor platelets by anticoagulation protocol: Yes   Plan:  Resume apixaban 2.5mg  PO BID Rx will follow peripherally  Onnie Boer, PharmD, BCIDP, AAHIVP,  CPP Infectious Disease Pharmacist 12/17/2019 6:47 PM

## 2019-12-17 NOTE — Progress Notes (Signed)
PROGRESS NOTE  [name] JSH:[id number] DOB: 1923-06-04 DOA: 12/15/2019 PCP: [name], MD  HPI/Recap of past 24 hours: HPI from Dr [name] is a 86 y.o. female with medical history significant of moderate aortic stenosis, CAD s/p PCI, persistent A. Fib, HTN, HLD, hypothyroidism, low-grade lymphoma, chronic diastolic CHF presents to emergency department with generalized weakness since 1 week. Patient's son reports patient has been extremely weak since 1 week, unable to walk and fell last week however denies head trauma, seizure or loss of consciousness. Reports that since patient started on Eliquis couple of months ago due to A. fib she has multiple knots on her head.  Due to fall he called patient's cardiology who recommended to hold Eliquis for at least 3 days. The only change in her medicine recently is that her PCP increased her levothyroxine dose due to elevated TSH about 2 weeks ago. Pt lives alone at home however she has caregiver and son who takes care of her. In the ED, patient's BP was noted to be on lower side. CMP shows worsening kidney function.  Calcium of 14.0, troponin 28 trended down to 27, lactic acid: WNL, UA negative for infection, blood culture pending. CT head shows several scalp soft tissue masses, no infarction or hemorrhage.  Chest x-ray shows multiple opacities. [facility] hospitalist consulted for admission for further evaluation and management of hypercalcemia and generalized weakness.      Today, patient reported difficulty breathing, noted to be tachypneic, with A. fib with RVR.  Had a choking experience with breakfast this morning, otherwise denied any new complaint, denied any chest pain, abdominal pain, nausea/vomiting, fever/chills.    Assessment/Plan: Principal Problem:   Generalized weakness Active Problems:   Hyperlipidemia   Hypothyroidism, goitrous   Coronary artery disease   Aortic valvular stenosis, moderate to severe    Essential hypertension   Acute on chronic kidney failure (HCC)   Hypercalcemia   Hypercalcemia Unknown etiology, ??Malignancy vs multiple myeloma PTH low, parathyroid related protein pending, vitamin D WNL, SPEP and UPEP pending Hold home calcium and vitamin D Held aggressive hydration with IV fluids, due to shortness of breath Daily BMP  AKI on CKD stage IIIb Creatinine around 1.4, on admission 2.34 Hold IVF for now Daily BMP  Hypotension BP soft Hold home antihypertensive for now, except for metoprolol for now due to A. fib with RVR Hold IV fluids for now  Generalized weakness/fall Multifactorial, hypercalcemia vs AKI vs hypotension vs possible malignancy PT/OT Fall precautions  Pulmonary nodules likely lung malignancy Multiple soft tissue mass/nodules on head CT chest showed numerous bilateral pulmonary nodules suspicious for metastatic disease CT abdomen/pelvis without contrast showed pathologic retroperitoneal, bilateral iliac and bilateral inguinal adenopathy consistent with metastatic disease Discussed with oncology Dr. [name] on 12/17/2019, recommend noninvasive investigations to determine the primary malignancy LDH elevated, flow cytometry pending Discussed with son, no further heroic measures  Hypothyroidism Synthroid dose was recently increased by PCP TSH low 0.189, free T4 high 1.36 Hold home Synthroid for now, plan to repeat TSH/free T4 and possibly reduce dose  Chronic diastolic HF/aortic valve stenosis Appears dry Hold home Bumex for now due to AKI and  dehydration Monitor closely  Persistent A. Fib with RVR Heart rate uncontrolled Restart home metoprolol  Restart Eliquis Telemetry  Dysphagia SLP on board        Malnutrition Type:      Malnutrition Characteristics:      Nutrition Interventions:  Estimated body mass index is 21.75 kg/m as calculated from the following:   Height as of this encounter: '5\' 3"'$  (1.6 m).   Weight  as of this encounter: 55.7 kg.     Code Status: DNR  Family Communication: Discussed with son extensively on 12/17/2019  Disposition Plan: Status is: Inpatient  Remains inpatient appropriate because:Inpatient level of care appropriate due to severity of illness   Dispo: The patient is from: Home              Anticipated d/c is to: SNF              Anticipated d/c date is: 2 days              Patient currently is not medically stable to d/c.     Consultants:  Discussed with oncology on 12/17/2019  Procedures:  None  Antimicrobials:  None  DVT prophylaxis: Lovenox   Objective: Vitals:   12/17/19 0312 12/17/19 0737 12/17/19 1148 12/17/19 1644  BP: (!) 109/61 (!) 100/54 (!) 148/72 110/70  Pulse: (!) 114 102 (!) 126 (!) 118  Resp: (!) '24 22 23 22  '$ Temp: 98.1 F (36.7 C) 98.6 F (37 C) 98.4 F (36.9 C) 98.6 F (37 C)  TempSrc: Oral Oral Oral Oral  SpO2: 94% 93% 92% 95%  Weight: 55.7 kg     Height:        Intake/Output Summary (Last 24 hours) at 12/17/2019 1755 Last data filed at 12/17/2019 1644 Gross per 24 hour  Intake 240 ml  Output 1140 ml  Net -900 ml   Filed Weights   12/16/19 0030 12/16/19 0418 12/17/19 0312  Weight: 52.8 kg 52.8 kg 55.7 kg    Exam:  General: NAD, oriented x3, knots noted on scalp, nontender  Cardiovascular: S1, S2 present  Respiratory: CTAB  Abdomen: Soft, nontender, nondistended, bowel sounds present  Musculoskeletal: No bilateral pedal edema noted  Skin: Normal  Psychiatry: Normal mood   Data Reviewed: CBC: Recent Labs  Lab 12/14/19 1556 12/15/19 1005 12/15/19 1422 12/16/19 0229 12/17/19 0257  WBC 7.0 7.9 7.4 7.1 8.0  NEUTROABS  --  6.3  --   --  5.8  HGB 10.9* 11.0* 11.2* 10.0* 10.0*  HCT 34.1 35.8* 36.7 32.2* 32.9*  MCV 91 92.5 94.8 94.4 96.5  PLT 177 183 192 178 657   Basic Metabolic Panel: Recent Labs  Lab 12/14/19 1556 12/15/19 1005 12/15/19 1422 12/16/19 0229 12/17/19 0257  NA 142 141  --   143 143  K 4.7 4.1  --  4.3 3.9  CL 104 105  --  107 113*  CO2 26 25  --  26 22  GLUCOSE 110* 97  --  98 103*  BUN 63* 60*  --  61* 49*  CREATININE 2.53* 2.34* 2.35* 2.74* 2.42*  CALCIUM 13.2* 14.0* 13.8* 12.6* 11.8*  MG  --   --  2.6*  --   --   PHOS  --   --  3.9  --   --    GFR: Estimated Creatinine Clearance: 11.8 mL/min (A) (by C-G formula based on SCr of 2.42 mg/dL (H)). Liver Function Tests: Recent Labs  Lab 12/15/19 1005 12/16/19 0229 12/17/19 0257  AST 32 24 25  ALT '20 17 17  '$ ALKPHOS 99 85 82  BILITOT 1.8* 1.4* 1.3*  PROT 6.5 5.6* 5.5*  ALBUMIN 3.4* 2.8* 2.6*   No results for input(s): LIPASE, AMYLASE in the last 168 hours. No results for  input(s): AMMONIA in the last 168 hours. Coagulation Profile: Recent Labs  Lab 12/15/19 1005  INR 1.2   Cardiac Enzymes: No results for input(s): CKTOTAL, CKMB, CKMBINDEX, TROPONINI in the last 168 hours. BNP (last 3 results) No results for input(s): PROBNP in the last 8760 hours. HbA1C: No results for input(s): HGBA1C in the last 72 hours. CBG: No results for input(s): GLUCAP in the last 168 hours. Lipid Profile: No results for input(s): CHOL, HDL, LDLCALC, TRIG, CHOLHDL, LDLDIRECT in the last 72 hours. Thyroid Function Tests: Recent Labs    12/15/19 1422  TSH 0.189*  FREET4 1.36*   Anemia Panel: No results for input(s): VITAMINB12, FOLATE, FERRITIN, TIBC, IRON, RETICCTPCT in the last 72 hours. Urine analysis:    Component Value Date/Time   COLORURINE YELLOW 12/15/2019 1154   APPEARANCEUR TURBID (A) 12/15/2019 1154   LABSPEC 1.010 12/15/2019 1154   PHURINE 7.0 12/15/2019 1154   GLUCOSEU NEGATIVE 12/15/2019 1154   HGBUR NEGATIVE 12/15/2019 1154   BILIRUBINUR NEGATIVE 12/15/2019 1154   KETONESUR NEGATIVE 12/15/2019 1154   PROTEINUR NEGATIVE 12/15/2019 1154   UROBILINOGEN 1.0 11/01/2012 1849   NITRITE NEGATIVE 12/15/2019 1154   LEUKOCYTESUR NEGATIVE 12/15/2019 1154   Sepsis  Labs: '@LABRCNTIP'$ (procalcitonin:4,lacticidven:4)  ) Recent Results (from the past 240 hour(s))  Culture, blood (routine x 2)     Status: None (Preliminary result)   Collection Time: 12/15/19  2:22 PM   Specimen: BLOOD  Result Value Ref Range Status   Specimen Description BLOOD BLOOD LEFT FOREARM  Final   Special Requests   Final    BOTTLES DRAWN AEROBIC AND ANAEROBIC Blood Culture adequate volume   Culture   Final    NO GROWTH 2 DAYS Performed at [facility], [place] [address] [place], [place] [address]    Report Status PENDING  Incomplete  Culture, blood (routine x 2)     Status: None (Preliminary result)   Collection Time: 12/15/19  2:26 PM   Specimen: BLOOD  Result Value Ref Range Status   Specimen Description BLOOD RIGHT ANTECUBITAL  Final   Special Requests   Final    BOTTLES DRAWN AEROBIC AND ANAEROBIC Blood Culture results may not be optimal due to an inadequate volume of blood received in culture bottles   Culture   Final    NO GROWTH 2 DAYS Performed at [facility], [place] [address], [place], [place] [address]    Report Status PENDING  Incomplete  SARS Coronavirus 2 by RT PCR (hospital order, performed in [facility] hospital lab) Nasopharyngeal Nasopharyngeal Swab     Status: None   Collection Time: 12/15/19  2:46 PM   Specimen: Nasopharyngeal Swab  Result Value Ref Range Status   SARS Coronavirus 2 NEGATIVE NEGATIVE Final    Comment: (NOTE) SARS-CoV-2 target nucleic acids are NOT DETECTED.  The SARS-CoV-2 RNA is generally detectable in upper and lower respiratory specimens during the acute phase of infection. The lowest concentration of SARS-CoV-2 viral copies this assay can detect is 250 copies / mL. A negative result does not preclude SARS-CoV-2 infection and should not be used as the sole basis for treatment or other patient management decisions.  A negative result may occur with improper specimen collection / handling, submission of specimen  other than nasopharyngeal swab, presence of viral mutation(s) within the areas targeted by this assay, and inadequate number of viral copies (<250 copies / mL). A negative result must be combined with clinical observations, patient history, and epidemiological information.  Fact Sheet for Patients:   StrictlyIdeas.no  Fact Sheet for Healthcare Providers: BankingDealers.co.za  This test is not yet approved or  cleared by the [place] FDA and has been authorized for detection and/or diagnosis of SARS-CoV-2 by FDA under an Emergency Use Authorization (EUA).  This EUA will remain in effect (meaning this test can be used) for the duration of the COVID-19 declaration under Section 564(b)(1) of the Act, 21 U.S.C. section 360bbb-3(b)(1), unless the authorization is terminated or revoked sooner.  Performed at [facility], [place] [address], [place], [place] [address]       Studies: DG Chest Port 1 View  Result Date: 12/17/2019 CLINICAL DATA:  Dyspnea EXAM: PORTABLE CHEST 1 VIEW COMPARISON:  12/15/2019 FINDINGS: Cardiac shadow is stable. Aortic calcifications are again seen. Increasing left-sided pleural effusion is noted. Patchy nodular opacities are again seen throughout both lung similar to that noted on recent chest x-ray and CT. Left shoulder replacement is noted. No acute bony abnormality is seen. IMPRESSION: Increasing left-sided pleural effusion. Nodular opacities consistent with metastatic disease. Electronically Signed   By: Inez Catalina M.D.   On: 12/17/2019 09:51    Scheduled Meds: . atorvastatin  40 mg Oral q1800  . enoxaparin (LOVENOX) injection  30 mg Subcutaneous Q24H  . ezetimibe  10 mg Oral Daily  . folic acid  2 mg Oral Daily  . metoprolol tartrate  25 mg Oral BID  . polyethylene glycol  17 g Oral Daily  . senna  1 tablet Oral Daily    Continuous Infusions:    LOS: 2 days     Alma Friendly,  MD [facility]  If 7PM-7AM, please contact night-coverage [email or web address] 12/17/2019, 5:55 PM

## 2019-12-17 NOTE — Progress Notes (Signed)
Pt had difficulty with swallowing her scrambled eggs as tech was assisting her with breakfast.  She seemed to aspirate and had a bout of coughing.  About an hour later she is still coughing intermittently and complaining of a "knot" in her throat. Vital signs stable, although HR up to 140s with coughing fits.  MD made aware.  Swallow eval being ordered.  Pt without further complaints at this time. Will continue to monitor.

## 2019-12-17 NOTE — Evaluation (Signed)
Clinical/Bedside Swallow Evaluation Patient Details  Name: [name] MRN: [id number] Date of Birth: 1923/06/01  Today's Date: 12/17/2019 Time: SLP Start Time (ACUTE ONLY): 11 SLP Stop Time (ACUTE ONLY): 1517 SLP Time Calculation (min) (ACUTE ONLY): 14 min  Past Medical History:  Past Medical History:  Diagnosis Date  .  Acute respiratiory failure requiring BiPap 06/24/2011  . Allergy   . Aortic valvular stenosis, moderate to severe 06/24/2011   a. 10/2012 Echo: mod AS, Valve 1.25 cm^2 (VTI), 1.19cm^2 (Vmax).  . Arthritis    "back" (10/30/2012)  . Chronic diastolic CHF (congestive heart failure) ([place])    a. 10/2012 Echo: EF 55-60%, no rwma, Gr 2 DD, moderate AS, mod dil LA, mildly to mod dil RA.  Marland Kitchen Coronary artery disease    a. 03/2009 PCI RCA (3.0x18 BMS).  . Dyslipidemia   . Exertional shortness of breath   . Hypertension   . Hypothyroidism   . PAF (paroxysmal atrial fibrillation) ([place])    a. CHA2DS2VASc = 7-->chronic coumadin;  b. s/p DCCV 05/2011, 05/2012, 07/2014;  c. On amio.  . Rash    a. felt to be 2/2 lasix/torsemide in setting of sulfa allergy (lasix d/c ~ 06/2014, torsemide d/c 08/09/2014).   Past Surgical History:  Past Surgical History:  Procedure Laterality Date  . ANGIOPLASTY    . APPENDECTOMY    . BACK SURGERY    . CARDIAC CATHETERIZATION  10/06/2009   Continue medical therapy  . CARDIAC CATHETERIZATION  04/07/2009   RCA stented with a 3x64mm stent resulting in a reduction of 90% narrowing to normal  . CARDIOVERSION  06/19/2011   Procedure: CARDIOVERSION;  Surgeon: Sanda Klein, MD;  Location: [facility];  Service: Cardiovascular;  Laterality: N/A;  . CARDIOVERSION  07/11/2010   Successful coversion to sinus rhythm  . CARDIOVERSION N/A 08/10/2014   Procedure: CARDIOVERSION;  Surgeon: Sanda Klein, MD;  Location: [facility];  Service: Cardiovascular;  Laterality: N/A;  . CATARACT EXTRACTION W/ INTRAOCULAR LENS  IMPLANT, BILATERAL    . CORONARY ANGIOPLASTY WITH  STENT PLACEMENT     "1" (10/30/2012)  . DILATION AND CURETTAGE OF UTERUS    . EXCISIONAL HEMORRHOIDECTOMY    . FRACTURE SURGERY    . HIP ARTHROPLASTY Right 04/17/2015   Procedure: HIP MONOPOLAR HIP HEMI ARTHROPLASTY;  Surgeon: Marybelle Killings, MD;  Location: [facility];  Service: Orthopedics;  Laterality: Right;  . IR THORACENTESIS ASP PLEURAL SPACE W/IMG GUIDE  04/28/2018  . IR THORACENTESIS ASP PLEURAL SPACE W/IMG GUIDE  05/26/2018  . LUMBAR DISC SURGERY  X 2   "cleaned out scar tissue"   . SHOULDER ACROMIOPLASTY Left    "fell; put a new ball in" (10/30/2012)  . WRIST FRACTURE SURGERY Left    HPI:  [name] is a 86 y.o. female with medical history significant of moderate aortic stenosis, CAD s/p PCI, persistent A. Fib, HTN, HLD, hypothyroidism, low-grade lymphoma, chronic diastolic CHF presents to emergency department with generalized weakness since 1 week. Patient's son reports patient has been extremely weak since 1 week, unable to walk and fell last week however denies head trauma, seizure or loss of consciousness. Reports that since patient started on Eliquis couple of months ago due to A. fib she has multiple knots on her head. CT head shows several scalp soft tissue masses, no infarction or hemorrhage.  CT chest shows multile nodules and adenopathy on CT abdomen suggestive of metastatic disease. Pt observed to choke on eggs.    Assessment /  Plan / Recommendation Clinical Impression  Pt demonstrates immediate hard prolonged coughing with sips of thin liquids. She is able to tolerate nectar, puree and solids without incident. She does report that during her am meal she felt that solids would not go down and she totally unable to eat her food. Suspect that pt was coughing extensively with liquids which impacted her tolerance of solids. She does not have difficulty masticating soldis under observation today. Recommend pt initiate a conservative diet of puree and nectar, mostly to avoid  uncomfortable choking. Will f/u for advancement as possible, may benefit from [place].       Aspiration Risk       Diet Recommendation Dysphagia 1 (Puree);Nectar-thick liquid   Medication Administration: Crushed with puree Supervision: Patient able to self feed Compensations: Slow rate;Small sips/bites Postural Changes: Seated upright at 90 degrees    Other  Recommendations     Follow up Recommendations 24 hour supervision/assistance      Frequency and Duration min 2x/week  2 weeks       Prognosis        Swallow Study   General HPI: [name] is a 86 y.o. female with medical history significant of moderate aortic stenosis, CAD s/p PCI, persistent A. Fib, HTN, HLD, hypothyroidism, low-grade lymphoma, chronic diastolic CHF presents to emergency department with generalized weakness since 1 week. Patient's son reports patient has been extremely weak since 1 week, unable to walk and fell last week however denies head trauma, seizure or loss of consciousness. Reports that since patient started on Eliquis couple of months ago due to A. fib she has multiple knots on her head. CT head shows several scalp soft tissue masses, no infarction or hemorrhage.  CT chest shows multile nodules and adenopathy on CT abdomen suggestive of metastatic disease. Pt observed to choke on eggs.  Type of Study: Bedside Swallow Evaluation Diet Prior to this Study: Dysphagia 3 (soft);Thin liquids Temperature Spikes Noted: No Respiratory Status: Nasal cannula History of Recent Intubation: No Behavior/Cognition: Alert;Cooperative;Pleasant mood Oral Cavity Assessment: Within Functional Limits Oral Care Completed by SLP: No Oral Cavity - Dentition: Adequate natural dentition Vision: Functional for self-feeding Self-Feeding Abilities: Able to feed self Patient Positioning: Upright in bed Baseline Vocal Quality: Normal Volitional Cough: Strong Volitional Swallow: Able to elicit    Oral/Motor/Sensory  Function Overall Oral Motor/Sensory Function: Within functional limits   Ice Chips Ice chips: Within functional limits   Thin Liquid Thin Liquid: Impaired Pharyngeal  Phase Impairments: Cough - Immediate    Nectar Thick Nectar Thick Liquid: Within functional limits Presentation: Straw   Honey Thick Honey Thick Liquid: Not tested   Puree Puree: Within functional limits Presentation: Spoon;Self Fed   Solid     Solid: Within functional limits Presentation: [place] Orrie Schubert, MA [place] Pager [phone] Office [phone]  Lynann Beaver 12/17/2019,4:03 PM

## 2019-12-17 NOTE — Progress Notes (Signed)
Occupational Therapy Treatment Patient Details Name: [name] MRN: [id number] DOB: [birth year] Today's Date: 12/17/2019    History of present illness 86 y.o. female presenting with generalized weakness x1 wk, dehydration and hypercalcemia. PMHx significant for aortic stenosis, CAD s/p PCI, A-fib, HTN, HLD, hypothyroidism, low grade lymphoma, and chronic diastolic CHF.    OT comments  Pt making steady progress towards OT goals this session. Pt continues to present with decreased activity tolerance however able to complete household distance functional mobility with RW and MIN A to navigate RW around tight spaces. Pt initially disoriented to situation and location upon OTA arrival, however able to correctly state PLOF as noted from eval. Pt HR increase to max 132 bpm with mobility- RN aware. Pt likely could progress to home pending continued progress and 24 hour support as indicated below. Will continue to follow acutely per POC.    Follow Up Recommendations  Home health OT;Supervision/Assistance - 24 hour;SNF    Equipment Recommendations  3 in 1 bedside commode    Recommendations for Other Services      Precautions / Restrictions Precautions Precautions: Fall Precaution Comments: watch HR Restrictions Weight Bearing Restrictions: No       Mobility Bed Mobility Overal bed mobility: Modified Independent             General bed mobility comments: no assisted needed with use of bed rails and elevated HOB  Transfers Overall transfer level: Needs assistance Equipment used: Rolling walker (2 wheeled) Transfers: Sit to/from Stand Sit to Stand: Min guard         General transfer comment: minguard for safety with line mgmt, initally steadying assist upon stand    Balance Overall balance assessment: Needs assistance Sitting-balance support: Feet supported;No upper extremity supported Sitting balance-Leahy Scale: Good Sitting balance - Comments: able to static sit EOB  with no LOB     Standing balance-Leahy Scale: Poor Standing balance comment: BUE supported on RW during mobility                           ADL either performed or assessed with clinical judgement   ADL Overall ADL's : Needs assistance/impaired                         Toilet Transfer: Minimal assistance;RW;Ambulation Toilet Transfer Details (indicate cue type and reason): MIN A to navigate RW through tight spaces         Functional mobility during ADLs: Minimal assistance;Rolling walker General ADL Comments: pt continues to present with decreased activity tolerance and tachycardia with exertion     Vision Baseline Vision/History: Macular Degeneration Patient Visual Report: No change from baseline Vision Assessment?: Vision impaired- to be further tested in functional context   Perception     Praxis      Cognition Arousal/Alertness: Awake/alert Behavior During Therapy: WFL for tasks assessed/performed Overall Cognitive Status: Impaired/Different from baseline Area of Impairment: Orientation;Following commands;Safety/judgement                 Orientation Level: Place;Time;Situation (pt asking therapist, "where am I and how did I get here?")     Following Commands: Follows multi-step commands consistently;Follows one step commands consistently Safety/Judgement: Decreased awareness of safety     General Comments: pt intially confused upon OTA arrival but after orienting pt to situation and location but following commands and attentive to session. slightly impulsivity once sitting EOB but followed cues related to  safety with no isssues        Exercises     Shoulder Instructions       General Comments Pt HR increase to 132 bpm with mobility pt symptomatic reporting feeling like her heart was "racing"    Pertinent Vitals/ Pain       Pain Assessment: No/denies pain  Home Living                                           Prior Functioning/Environment              Frequency  Min 3X/week        Progress Toward Goals  OT Goals(current goals can now be found in the care plan section)  Progress towards OT goals: Progressing toward goals  Acute Rehab OT Goals Patient Stated Goal: To return home.  OT Goal Formulation: With patient Time For Goal Achievement: 12/30/19 Potential to Achieve Goals: Good  Plan Discharge plan remains appropriate;Frequency remains appropriate    Co-evaluation                 AM-PAC OT "6 Clicks" Daily Activity     Outcome Measure   Help from another person eating meals?: None Help from another person taking care of personal grooming?: A Little Help from another person toileting, which includes using toliet, bedpan, or urinal?: A Little Help from another person bathing (including washing, rinsing, drying)?: A Little Help from another person to put on and taking off regular upper body clothing?: None Help from another person to put on and taking off regular lower body clothing?: A Little 6 Click Score: 20    End of Session Equipment Utilized During Treatment: Gait belt;Rolling walker  OT Visit Diagnosis: Unsteadiness on feet (R26.81);History of falling (Z91.81)   Activity Tolerance Patient tolerated treatment well   Patient Left in chair;with call bell/phone within reach;with chair alarm set   Nurse Communication Mobility status;Other (comment) (tachy with mobility)        Time: 9381-8299 OT Time Calculation (min): 18 min  Charges: OT General Charges $OT Visit: 1 Visit OT Treatments $Self Care/Home Management : 8-22 mins  Lanier Clam., COTA/L Acute Rehabilitation Services [phone]    MYRACLE FEBRES 12/17/2019, 4:44 PM

## 2019-12-18 DIAGNOSIS — Z66 Do not resuscitate: Secondary | ICD-10-CM

## 2019-12-18 DIAGNOSIS — I10 Essential (primary) hypertension: Secondary | ICD-10-CM | POA: Diagnosis not present

## 2019-12-18 DIAGNOSIS — Z7189 Other specified counseling: Secondary | ICD-10-CM

## 2019-12-18 DIAGNOSIS — Z515 Encounter for palliative care: Secondary | ICD-10-CM | POA: Diagnosis not present

## 2019-12-18 DIAGNOSIS — R531 Weakness: Secondary | ICD-10-CM | POA: Diagnosis not present

## 2019-12-18 DIAGNOSIS — I350 Nonrheumatic aortic (valve) stenosis: Secondary | ICD-10-CM | POA: Diagnosis not present

## 2019-12-18 DIAGNOSIS — N179 Acute kidney failure, unspecified: Secondary | ICD-10-CM | POA: Diagnosis not present

## 2019-12-18 LAB — CBC WITH DIFFERENTIAL/PLATELET
Abs Immature Granulocytes: 0.06 10*3/uL (ref 0.00–0.07)
Basophils Absolute: 0 10*3/uL (ref 0.0–0.1)
Basophils Relative: 1 %
Eosinophils Absolute: 0.2 10*3/uL (ref 0.0–0.5)
Eosinophils Relative: 3 %
HCT: 32.1 % — ABNORMAL LOW (ref 36.0–46.0)
Hemoglobin: 9.7 g/dL — ABNORMAL LOW (ref 12.0–15.0)
Immature Granulocytes: 1 %
Lymphocytes Relative: 17 %
Lymphs Abs: 1 10*3/uL (ref 0.7–4.0)
MCH: 28.2 pg (ref 26.0–34.0)
MCHC: 30.2 g/dL (ref 30.0–36.0)
MCV: 93.3 fL (ref 80.0–100.0)
Monocytes Absolute: 0.7 10*3/uL (ref 0.1–1.0)
Monocytes Relative: 11 %
Neutro Abs: 4.1 10*3/uL (ref 1.7–7.7)
Neutrophils Relative %: 67 %
Platelets: 171 10*3/uL (ref 150–400)
RBC: 3.44 MIL/uL — ABNORMAL LOW (ref 3.87–5.11)
RDW: 14.5 % (ref 11.5–15.5)
WBC: 6.1 10*3/uL (ref 4.0–10.5)
nRBC: 0 % (ref 0.0–0.2)

## 2019-12-18 LAB — COMPREHENSIVE METABOLIC PANEL
ALT: 17 U/L (ref 0–44)
AST: 26 U/L (ref 15–41)
Albumin: 2.5 g/dL — ABNORMAL LOW (ref 3.5–5.0)
Alkaline Phosphatase: 79 U/L (ref 38–126)
Anion gap: 9 (ref 5–15)
BUN: 44 mg/dL — ABNORMAL HIGH (ref 8–23)
CO2: 24 mmol/L (ref 22–32)
Calcium: 11.8 mg/dL — ABNORMAL HIGH (ref 8.9–10.3)
Chloride: 112 mmol/L — ABNORMAL HIGH (ref 98–111)
Creatinine, Ser: 2.15 mg/dL — ABNORMAL HIGH (ref 0.44–1.00)
GFR calc Af Amer: 22 mL/min — ABNORMAL LOW (ref >60)
GFR calc non Af Amer: 19 mL/min — ABNORMAL LOW (ref >60)
Glucose, Bld: 88 mg/dL (ref 70–99)
Potassium: 3.7 mmol/L (ref 3.5–5.1)
Sodium: 145 mmol/L (ref 135–145)
Total Bilirubin: 1.4 mg/dL — ABNORMAL HIGH (ref 0.3–1.2)
Total Protein: 5.2 g/dL — ABNORMAL LOW (ref 6.5–8.1)

## 2019-12-18 MED ORDER — METOPROLOL TARTRATE 50 MG PO TABS
50.0000 mg | ORAL_TABLET | Freq: Two times a day (BID) | ORAL | Status: DC
  Administered 2019-12-18 – 2019-12-23 (×11): 50 mg via ORAL
  Filled 2019-12-18 (×12): qty 1

## 2019-12-18 MED ORDER — SENNA 8.6 MG PO TABS
1.0000 | ORAL_TABLET | Freq: Two times a day (BID) | ORAL | Status: DC
  Administered 2019-12-18 – 2019-12-23 (×6): 8.6 mg via ORAL
  Filled 2019-12-18 (×6): qty 1

## 2019-12-18 MED ORDER — POLYETHYLENE GLYCOL 3350 17 G PO PACK
17.0000 g | PACK | Freq: Two times a day (BID) | ORAL | Status: DC
  Administered 2019-12-18 – 2019-12-22 (×6): 17 g via ORAL
  Filled 2019-12-18 (×7): qty 1

## 2019-12-18 MED ORDER — DILTIAZEM HCL 25 MG/5ML IV SOLN
10.0000 mg | Freq: Once | INTRAVENOUS | Status: AC
  Administered 2019-12-18: 10 mg via INTRAVENOUS
  Filled 2019-12-18: qty 5

## 2019-12-18 NOTE — Progress Notes (Addendum)
PROGRESS NOTE  [name] JJK:[id number] DOB: 05-23-1924 DOA: 12/15/2019 PCP: [name], MD  HPI/Recap of past 24 hours: HPI from Dr [name] is a 86 y.o. female with medical history significant of moderate aortic stenosis, CAD s/p PCI, persistent A. Fib, HTN, HLD, hypothyroidism, low-grade lymphoma, chronic diastolic CHF presents to emergency department with generalized weakness since 1 week. Patient's son reports patient has been extremely weak since 1 week, unable to walk and fell last week however denies head trauma, seizure or loss of consciousness. Reports that since patient started on Eliquis couple of months ago due to A. fib she has multiple knots on her head.  Due to fall he called patient's cardiology who recommended to hold Eliquis for at least 3 days. The only change in her medicine recently is that her PCP increased her levothyroxine dose due to elevated TSH about 2 weeks ago. Pt lives alone at home however she has caregiver and son who takes care of her. In the ED, patient's BP was noted to be on lower side. CMP shows worsening kidney function.  Calcium of 14.0, troponin 28 trended down to 27, lactic acid: WNL, UA negative for infection, blood culture pending. CT head shows several scalp soft tissue masses, no infarction or hemorrhage.  Chest x-ray shows multiple opacities. [facility] hospitalist consulted for admission for further evaluation and management of hypercalcemia and generalized weakness.      Today, patient denied any new complaints, met patient sitting on toilet trying to have a BM, with heart rates in the 130s.  Noted to be straining.  Patient denied any chest pain, shortness of breath, abdominal pain, nausea/vomiting, fever/chills.    Assessment/Plan: Principal Problem:   Generalized weakness Active Problems:   Hyperlipidemia   Hypothyroidism, goitrous   Coronary artery disease   Aortic valvular stenosis, moderate to severe    Essential hypertension   Acute on chronic kidney failure ([place])   Hypercalcemia   Palliative care by specialist   Goals of care, counseling/discussion   DNR (do not resuscitate)   Hypercalcemia Unknown etiology, ??Malignancy vs multiple myeloma PTH low, parathyroid related protein pending, vitamin D WNL, SPEP and UPEP pending Hold home calcium and vitamin D S/p IV fluids Daily BMP  AKI on CKD stage IIIb Creatinine around 1.4, on admission 2.34 Hold IVF for now Daily BMP  Hypotension BP improving Hold home antihypertensive for now, except for metoprolol for now due to A. fib with RVR Hold IV fluids for now  Generalized weakness/fall Multifactorial, hypercalcemia vs AKI vs hypotension vs possible malignancy PT/OT Fall precautions  Pulmonary nodules likely lung malignancy Multiple soft tissue mass/nodules on head CT chest showed numerous bilateral pulmonary nodules suspicious for metastatic disease CT abdomen/pelvis without contrast showed pathologic retroperitoneal, bilateral iliac and bilateral inguinal adenopathy consistent with metastatic disease Discussed with oncology Dr. [name] on 12/17/2019, recommend noninvasive investigations to determine the primary malignancy LDH elevated, flow cytometry pending Discussed with son, no further heroic measures  Hypothyroidism Synthroid dose was recently increased by PCP TSH low 0.189, free T4 high 1.36 Hold home Synthroid for now, plan to repeat TSH/free T4 and possibly reduce dose  Chronic diastolic HF/aortic valve stenosis Hold home Bumex for now due to AKI and dehydration Monitor closely  Persistent A. Fib with RVR Heart rate uncontrolled Restart home metoprolol, increased to 50 mg twice daily Restart Eliquis Telemetry  Dysphagia SLP on board  Goals of care discussion Patient with very poor prognosis Palliative consulted Discussed  extensively with patient's son on 12/17/2019 about overall prognosis, requesting patient  be discharged to SNF with possible hospice support.       Malnutrition Type:      Malnutrition Characteristics:      Nutrition Interventions:       Estimated body mass index is 21.64 kg/m as calculated from the following:   Height as of this encounter: '5\' 3"'$  (1.6 m).   Weight as of this encounter: 55.4 kg.     Code Status: DNR  Family Communication: Discussed with son extensively on 12/17/2019  Disposition Plan: Status is: Inpatient  Remains inpatient appropriate because:Inpatient level of care appropriate due to severity of illness   Dispo: The patient is from: Home              Anticipated d/c is to: SNF              Anticipated d/c date is: 2 days              Patient currently is not medically stable to d/c.     Consultants:  Discussed with oncology on 12/17/2019  Procedures:  None  Antimicrobials:  None  DVT prophylaxis: Lovenox   Objective: Vitals:   12/18/19 1227 12/18/19 1400 12/18/19 1500 12/18/19 1600  BP:  95/70 120/74 (!) 110/59  Pulse:  87 105 94  Resp:  '23 18 20  '$ Temp:      TempSrc:      SpO2: 95% 95% 99% 97%  Weight:      Height:        Intake/Output Summary (Last 24 hours) at 12/18/2019 1645 Last data filed at 12/18/2019 0900 Gross per 24 hour  Intake 240 ml  Output 400 ml  Net -160 ml   Filed Weights   12/16/19 0418 12/17/19 0312 12/18/19 0332  Weight: 52.8 kg 55.7 kg 55.4 kg    Exam:  General: NAD, oriented x3, knots noted on scalp, nontender  Cardiovascular: S1, S2 present  Respiratory: CTAB  Abdomen: Soft, nontender, nondistended, bowel sounds present  Musculoskeletal: No bilateral pedal edema noted  Skin: Normal  Psychiatry: Normal mood   Data Reviewed: CBC: Recent Labs  Lab 12/15/19 1005 12/15/19 1422 12/16/19 0229 12/17/19 0257 12/18/19 0249  WBC 7.9 7.4 7.1 8.0 6.1  NEUTROABS 6.3  --   --  5.8 4.1  HGB 11.0* 11.2* 10.0* 10.0* 9.7*  HCT 35.8* 36.7 32.2* 32.9* 32.1*  MCV 92.5 94.8 94.4  96.5 93.3  PLT 183 192 178 164 333   Basic Metabolic Panel: Recent Labs  Lab 12/14/19 1556 12/14/19 1556 12/15/19 1005 12/15/19 1422 12/16/19 0229 12/17/19 0257 12/18/19 0249  NA 142  --  141  --  143 143 145  K 4.7  --  4.1  --  4.3 3.9 3.7  CL 104  --  105  --  107 113* 112*  CO2 26  --  25  --  '26 22 24  '$ GLUCOSE 110*  --  97  --  98 103* 88  BUN 63*  --  60*  --  61* 49* 44*  CREATININE 2.53*   < > 2.34* 2.35* 2.74* 2.42* 2.15*  CALCIUM 13.2*   < > 14.0* 13.8* 12.6* 11.8* 11.8*  MG  --   --   --  2.6*  --   --   --   PHOS  --   --   --  3.9  --   --   --    < > =  values in this interval not displayed.   GFR: Estimated Creatinine Clearance: 13.2 mL/min (A) (by C-G formula based on SCr of 2.15 mg/dL (H)). Liver Function Tests: Recent Labs  Lab 12/15/19 1005 12/16/19 0229 12/17/19 0257 12/18/19 0249  AST 32 '24 25 26  '$ ALT '20 17 17 17  '$ ALKPHOS 99 85 82 79  BILITOT 1.8* 1.4* 1.3* 1.4*  PROT 6.5 5.6* 5.5* 5.2*  ALBUMIN 3.4* 2.8* 2.6* 2.5*   No results for input(s): LIPASE, AMYLASE in the last 168 hours. No results for input(s): AMMONIA in the last 168 hours. Coagulation Profile: Recent Labs  Lab 12/15/19 1005  INR 1.2   Cardiac Enzymes: No results for input(s): CKTOTAL, CKMB, CKMBINDEX, TROPONINI in the last 168 hours. BNP (last 3 results) No results for input(s): PROBNP in the last 8760 hours. HbA1C: No results for input(s): HGBA1C in the last 72 hours. CBG: No results for input(s): GLUCAP in the last 168 hours. Lipid Profile: No results for input(s): CHOL, HDL, LDLCALC, TRIG, CHOLHDL, LDLDIRECT in the last 72 hours. Thyroid Function Tests: No results for input(s): TSH, T4TOTAL, FREET4, T3FREE, THYROIDAB in the last 72 hours. Anemia Panel: No results for input(s): VITAMINB12, FOLATE, FERRITIN, TIBC, IRON, RETICCTPCT in the last 72 hours. Urine analysis:    Component Value Date/Time   COLORURINE YELLOW 12/15/2019 1154   APPEARANCEUR TURBID (A) 12/15/2019  1154   LABSPEC 1.010 12/15/2019 1154   PHURINE 7.0 12/15/2019 1154   GLUCOSEU NEGATIVE 12/15/2019 1154   HGBUR NEGATIVE 12/15/2019 1154   BILIRUBINUR NEGATIVE 12/15/2019 1154   KETONESUR NEGATIVE 12/15/2019 1154   PROTEINUR NEGATIVE 12/15/2019 1154   UROBILINOGEN 1.0 11/01/2012 1849   NITRITE NEGATIVE 12/15/2019 1154   LEUKOCYTESUR NEGATIVE 12/15/2019 1154   Sepsis Labs: '@LABRCNTIP'$ (procalcitonin:4,lacticidven:4)  ) Recent Results (from the past 240 hour(s))  Culture, blood (routine x 2)     Status: None (Preliminary result)   Collection Time: 12/15/19  2:22 PM   Specimen: BLOOD  Result Value Ref Range Status   Specimen Description BLOOD BLOOD LEFT FOREARM  Final   Special Requests   Final    BOTTLES DRAWN AEROBIC AND ANAEROBIC Blood Culture adequate volume   Culture   Final    NO GROWTH 3 DAYS Performed at [facility], [employer] [place] [employer] [address] [place], [place] [address]    Report Status PENDING  Incomplete  Culture, blood (routine x 2)     Status: None (Preliminary result)   Collection Time: 12/15/19  2:26 PM   Specimen: BLOOD  Result Value Ref Range Status   Specimen Description BLOOD RIGHT ANTECUBITAL  Final   Special Requests   Final    BOTTLES DRAWN AEROBIC AND ANAEROBIC Blood Culture results may not be optimal due to an inadequate volume of blood received in culture bottles   Culture   Final    NO GROWTH 3 DAYS Performed at [facility], [place] [address] [place], [place] [address]    Report Status PENDING  Incomplete  SARS Coronavirus 2 by RT PCR (hospital order, performed in [facility] hospital lab) Nasopharyngeal Nasopharyngeal Swab     Status: None   Collection Time: 12/15/19  2:46 PM   Specimen: Nasopharyngeal Swab  Result Value Ref Range Status   SARS Coronavirus 2 NEGATIVE NEGATIVE Final    Comment: (NOTE) SARS-CoV-2 target nucleic acids are NOT DETECTED.  The SARS-CoV-2 RNA is generally detectable in upper and lower respiratory specimens during the  acute phase of infection. The lowest concentration of  SARS-CoV-2 viral copies this assay can detect is 250 copies / mL. A negative result does not preclude SARS-CoV-2 infection and should not be used as the sole basis for treatment or other patient management decisions.  A negative result may occur with improper specimen collection / handling, submission of specimen other than nasopharyngeal swab, presence of viral mutation(s) within the areas targeted by this assay, and inadequate number of viral copies (<250 copies / mL). A negative result must be combined with clinical observations, patient history, and epidemiological information.  Fact Sheet for Patients:   StrictlyIdeas.no  Fact Sheet for Healthcare Providers: BankingDealers.co.za  This test is not yet approved or  cleared by the [place] FDA and has been authorized for detection and/or diagnosis of SARS-CoV-2 by FDA under an Emergency Use Authorization (EUA).  This EUA will remain in effect (meaning this test can be used) for the duration of the COVID-19 declaration under Section 564(b)(1) of the Act, 21 U.S.C. section 360bbb-3(b)(1), unless the authorization is terminated or revoked sooner.  Performed at [facility], [place] [address], [place], [place] [address]       Studies: No results found.  Scheduled Meds: . apixaban  2.5 mg Oral BID  . atorvastatin  40 mg Oral q1800  . ezetimibe  10 mg Oral Daily  . folic acid  2 mg Oral Daily  . metoprolol tartrate  50 mg Oral BID  . polyethylene glycol  17 g Oral Daily  . senna  1 tablet Oral Daily    Continuous Infusions:    LOS: 3 days     Alma Friendly, MD [facility]  If 7PM-7AM, please contact night-coverage [email or web address] 12/18/2019, 4:45 PM

## 2019-12-18 NOTE — Consult Note (Addendum)
Palliative Medicine Inpatient Consult Note  Reason for consult:   "Pt with metastatic disease, with other comorbidities. Please kindly see for further [place] discussion. Work up in progress to find out primary malignancy. Pt doesnt know yet. Son aware"  HPI:  Per intake H&P --> [name] is a 86 y.o. female with medical history significant of moderate aortic stenosis, coronary artery disease status post PCI, persistent A. fib, hypertension, hyperlipidemia, hypothyroidism, low-grade lymphoma, chronic diastolic CHF presents to emergency department with generalized weakness since 1 week.  Palliative care was asked to get involved to aid in [place] conversations.  Clinical Assessment/Goals of Care: I have reviewed medical records including EPIC notes, labs and imaging, received report from bedside RN, assessed the patient who was sitting up in bed talking to her brother who was present at bedside.    I called [name] (son) and [name] (daughter in law) to further discuss diagnosis prognosis, [place], EOL wishes, disposition and options.   I introduced Palliative Medicine as specialized medical care for people living with serious illness. It focuses on providing relief from the symptoms and stress of a serious illness. The goal is to improve quality of life for both the patient and the family.  I asked [name] to tell me about his mother. He shares that she is from [place], [place]. They were married for over 4 years, he passed away almost exactly one year ago. They share one son, [name]. 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 She enjoys Pharmacist, hospital and country music. She use to like doing crossword puzzles though due to her cataracts she is not longer able to complete these. She is a religious woman and attends "[employer] church in [place].  [name] was able to be alone during the day hours. She intermittently used a walker for mobility. She stayed in her home and would go  out to her back porch. She performed some of her bADLs, her son and daughter in law helped her during the evening hours.   [name]'s husband passed away last 2022-11-29. From [name]'s perspective her functional state has declined over the last one year. Most especially over the last week she has had a marked decline.  I asked [name] what he understood about [name]'s present illness. He shares that he knows she has cancer, likely throughout her body. He shares that he does not know in the larger scheme of things what this means in terms of time left. Per review of the medical records the plan is for an oncology evaluation to identify the primary source for better prognostication.  [name] shares that his mother does not know about her diease and he knows he has to share with her that things are likely not looking well.   A detailed discussion was had today regarding advanced directives, patients son [name] is her decision maker by default. We have no ACP documents.    Concepts specific to code status, artifical feeding and hydration, continued IV antibiotics and rehospitalization was had.  [name] shares that his mother is a DNAR/DNI, she would not want heroic life sustaining measures.  The difference between a aggressive medical intervention path  and a palliative comfort care path for this patient at this time was had.  I described hospice as a service for patients for have a life expectancy of < 3month. It preserves dignity and quality at the end phases of life. The focus changes from curative to symptom relief. BSheran Spineand his wife have been  through this process with multiple family members they are familiar with hospice.  At this juncture [name] is inquiring to know more regaurding a "time frame". I shared that sometimes these can be difficult to predict. We discussed how often we judge by overall functional state and nutritional intake to help guide decisions. At this juncture [name] is eating fairly well with  assitance. She is able to mobilize with assistance.   [name] at this point in time would like her to transition to [facility] to optimize her strength and then transition to hospice. Ideally they would like her to stay at [facility] for hospice care. I shared with them that this would likely require a copayment upwards of $200 daily. They were curious to better understand what this cost would look like. I have reached out to our [facility] team to provide them with this additional information and requested a hospice consult.   Discussed the importance of continued conversation with family and their  medical providers regarding overall plan of care and treatment options, ensuring decisions are within the context of the patients values and GOCs.  Decision Maker: [name] (son) [phone]  SUMMARY OF RECOMMENDATIONS   DNAR/DNI  TOC --> Hospice Consult for education  TOC --> Placement at "[facility]"  Chaplain Support  Family requesting a formal medically update, interested to better understand prognosis from oncology perspective  Code Status/Advance Care Planning: DNAR/DNI  Palliative Prophylaxis:   Oral Care, Mobility, Pain, Delirium  Additional Recommendations (Limitations, Scope, Preferences):  Continue current scope of care. No heroic interventions/measures   Psycho-social/Spiritual:   Desire for further Chaplaincy support: Yes - Protestant  Additional Recommendations: Education on hospice   Prognosis: Unclear though given her ECOG score she is at about a 3 which would result in a median survival of about two months  Discharge Planning: Discharge to [facility]. Will need OP Palliative and eventual transition to hospice.   PPS: 30%-40%   This conversation/these recommendations were discussed with patient primary care team, Dr. Horris Latino  Time In: 0945 Time Out: 1100 Total Time: 75 Greater than 50%  of this time was spent counseling and coordinating care  related to the above assessment and plan. ________________________________________________________________________________________ Addendum:  I met with [name] and her daughter in law, [name] at bedside. We shared with [name] that there were some abnormalities on her imaging studies and there is suspicion that she has a malignant process occurring. She stated, "that's okay, I have lived a good life. I am 86 years old!" I shared that there are still results which are pending though this is the strong suspicion of the medical team. We discussed optimizing the time she has left through spending time with her family. She shared with me the importance of her family. She explained her family tree to me in great detail. We were able to discuss the wonderful life she has lived filled with travel and adventure. Offered therapeutic listening as a form of emotional support.   [name] would very much like to proceed with the plan to go to [facility] as she felt they took good care of her in the past.   All additional questions and concerns answered.  Additional Time: 45 Greater than 50%  of this time was spent counseling and coordinating care related to the above assessment and plan.  [place] Team Team Cell Phone: [phone] Please utilize secure chat with additional questions, if there is no response within 30 minutes please call the above phone number  Palliative Medicine Team providers are available by phone from 7am to 7pm daily and can be reached through the team cell phone.  Should this patient require assistance outside of these hours, please call the patient's attending physician.

## 2019-12-18 NOTE — Progress Notes (Signed)
Pt in Afib RVR. Pt rate 120's-150's. Mews score red. md paged. New orders given. Will escalate vital signs and continue to monitor.

## 2019-12-18 NOTE — Progress Notes (Signed)
Occupational Therapy Treatment Patient Details Name: [name] MRN: [id number] DOB: 1924-03-19 Today's Date: 12/18/2019    History of present illness 86 y.o. female presenting with generalized weakness x1 wk, dehydration and hypercalcemia. PMHx significant for aortic stenosis, CAD s/p PCI, A-fib, HTN, HLD, hypothyroidism, low grade lymphoma, and chronic diastolic CHF.    OT comments  Patient continues to make steady progress towards goals in skilled OT session. Patient's session encompassed basic ADLs, and functional transfers in order to increase overall activity tolerance. Pt on 2L upon entry, however able to maintain 95% saturation levels on RA for the entirety of the session. Pt remains mod I for bed mobility and required 1 person HHA in order to ambulate to chair. Pt continues to fatigue quickly, however HR remained stable during transfer. Discharge to be updated to SNF as family is unable to provide 24 hour supervision to meet patient's needs; will continue to follow acutely.   Follow Up Recommendations  SNF;Supervision/Assistance - 24 hour    Equipment Recommendations  3 in 1 bedside commode    Recommendations for Other Services      Precautions / Restrictions Precautions Precautions: Fall Precaution Comments: watch HR Restrictions Weight Bearing Restrictions: No       Mobility Bed Mobility Overal bed mobility: Modified Independent             General bed mobility comments: no assisted needed with use of bed rails and elevated HOB  Transfers Overall transfer level: Needs assistance Equipment used: 1 person hand held assist Transfers: Sit to/from Stand Sit to Stand: Min guard         General transfer comment: Needed one hand to steady and for safety, but able to ambulate to chair with no difficulty noted    Balance Overall balance assessment: Needs assistance Sitting-balance support: Feet supported;No upper extremity supported Sitting balance-Leahy  Scale: Good Sitting balance - Comments: able to static sit EOB with no LOB   Standing balance support: Single extremity supported Standing balance-Leahy Scale: Poor Standing balance comment: Required one person HHA for safety                           ADL either performed or assessed with clinical judgement   ADL Overall ADL's : Needs assistance/impaired     Grooming: Brushing hair;Sitting Grooming Details (indicate cue type and reason): Sitting EOB                 Toilet Transfer: Minimal assistance;Ambulation Toilet Transfer Details (indicate cue type and reason): Simulated, One person HHA to transfer to recliner         Functional mobility during ADLs: Minimal assistance General ADL Comments: One person HHA to transfer to recliner, able sit EOB with supervision, remains with decreased activity tolerance, but HR stable     Vision       Perception     Praxis      Cognition Arousal/Alertness: Awake/alert Behavior During Therapy: WFL for tasks assessed/performed Overall Cognitive Status: Within Functional Limits for tasks assessed                                 General Comments: Pt with noteably clearer cognition in session, able to relay all events leading to her coming into the hospital        Exercises     Shoulder Instructions  General Comments      Pertinent Vitals/ Pain       Pain Assessment: No/denies pain  Home Living                                          Prior Functioning/Environment              Frequency  Min 3X/week        Progress Toward Goals  OT Goals(current goals can now be found in the care plan section)  Progress towards OT goals: Progressing toward goals  Acute Rehab OT Goals Patient Stated Goal: To find out whats wrong with me OT Goal Formulation: With patient Time For Goal Achievement: 12/30/19 Potential to Achieve Goals: Good  Plan Discharge plan needs to  be updated    Co-evaluation                 AM-PAC OT "6 Clicks" Daily Activity     Outcome Measure   Help from another person eating meals?: None Help from another person taking care of personal grooming?: A Little Help from another person toileting, which includes using toliet, bedpan, or urinal?: A Little Help from another person bathing (including washing, rinsing, drying)?: A Little Help from another person to put on and taking off regular upper body clothing?: None Help from another person to put on and taking off regular lower body clothing?: A Little 6 Click Score: 20    End of Session Equipment Utilized During Treatment: Other (comment) (One person HHA)  OT Visit Diagnosis: Unsteadiness on feet (R26.81);History of falling (Z91.81)   Activity Tolerance Patient tolerated treatment well   Patient Left in chair;with call bell/phone within reach;with chair alarm set   Nurse Communication Mobility status;Other (comment) (Wanting something to drink, no thickened liquids on floor)        Time: 4970-2637 OT Time Calculation (min): 22 min  Charges: OT General Charges $OT Visit: 1 Visit OT Treatments $Self Care/Home Management : 8-22 mins  Corinne Ports E. Hatcher Froning, COTA/L Acute Rehabilitation Services [place] 12/18/2019, 12:45 PM

## 2019-12-19 DIAGNOSIS — Z66 Do not resuscitate: Secondary | ICD-10-CM | POA: Diagnosis not present

## 2019-12-19 DIAGNOSIS — I10 Essential (primary) hypertension: Secondary | ICD-10-CM | POA: Diagnosis not present

## 2019-12-19 DIAGNOSIS — Z7189 Other specified counseling: Secondary | ICD-10-CM | POA: Diagnosis not present

## 2019-12-19 DIAGNOSIS — Z515 Encounter for palliative care: Secondary | ICD-10-CM | POA: Diagnosis not present

## 2019-12-19 DIAGNOSIS — I350 Nonrheumatic aortic (valve) stenosis: Secondary | ICD-10-CM | POA: Diagnosis not present

## 2019-12-19 DIAGNOSIS — R531 Weakness: Secondary | ICD-10-CM | POA: Diagnosis not present

## 2019-12-19 DIAGNOSIS — N179 Acute kidney failure, unspecified: Secondary | ICD-10-CM | POA: Diagnosis not present

## 2019-12-19 LAB — CBC WITH DIFFERENTIAL/PLATELET
Abs Immature Granulocytes: 0.07 10*3/uL (ref 0.00–0.07)
Basophils Absolute: 0.1 10*3/uL (ref 0.0–0.1)
Basophils Relative: 1 %
Eosinophils Absolute: 0.3 10*3/uL (ref 0.0–0.5)
Eosinophils Relative: 4 %
HCT: 31.9 % — ABNORMAL LOW (ref 36.0–46.0)
Hemoglobin: 9.9 g/dL — ABNORMAL LOW (ref 12.0–15.0)
Immature Granulocytes: 1 %
Lymphocytes Relative: 23 %
Lymphs Abs: 1.6 10*3/uL (ref 0.7–4.0)
MCH: 29.2 pg (ref 26.0–34.0)
MCHC: 31 g/dL (ref 30.0–36.0)
MCV: 94.1 fL (ref 80.0–100.0)
Monocytes Absolute: 0.7 10*3/uL (ref 0.1–1.0)
Monocytes Relative: 10 %
Neutro Abs: 4.4 10*3/uL (ref 1.7–7.7)
Neutrophils Relative %: 61 %
Platelets: 182 10*3/uL (ref 150–400)
RBC: 3.39 MIL/uL — ABNORMAL LOW (ref 3.87–5.11)
RDW: 14.6 % (ref 11.5–15.5)
WBC: 7.2 10*3/uL (ref 4.0–10.5)
nRBC: 0 % (ref 0.0–0.2)

## 2019-12-19 LAB — COMPREHENSIVE METABOLIC PANEL
ALT: 17 U/L (ref 0–44)
AST: 25 U/L (ref 15–41)
Albumin: 2.5 g/dL — ABNORMAL LOW (ref 3.5–5.0)
Alkaline Phosphatase: 81 U/L (ref 38–126)
Anion gap: 8 (ref 5–15)
BUN: 45 mg/dL — ABNORMAL HIGH (ref 8–23)
CO2: 25 mmol/L (ref 22–32)
Calcium: 12.2 mg/dL — ABNORMAL HIGH (ref 8.9–10.3)
Chloride: 114 mmol/L — ABNORMAL HIGH (ref 98–111)
Creatinine, Ser: 1.91 mg/dL — ABNORMAL HIGH (ref 0.44–1.00)
GFR calc Af Amer: 26 mL/min — ABNORMAL LOW (ref >60)
GFR calc non Af Amer: 22 mL/min — ABNORMAL LOW (ref >60)
Glucose, Bld: 132 mg/dL — ABNORMAL HIGH (ref 70–99)
Potassium: 3.4 mmol/L — ABNORMAL LOW (ref 3.5–5.1)
Sodium: 147 mmol/L — ABNORMAL HIGH (ref 135–145)
Total Bilirubin: 1 mg/dL (ref 0.3–1.2)
Total Protein: 5.3 g/dL — ABNORMAL LOW (ref 6.5–8.1)

## 2019-12-19 MED ORDER — ZOLEDRONIC ACID 4 MG/5ML IV CONC
4.0000 mg | Freq: Once | INTRAVENOUS | Status: AC
  Administered 2019-12-19: 4 mg via INTRAVENOUS
  Filled 2019-12-19: qty 5

## 2019-12-19 MED ORDER — SODIUM CHLORIDE 0.9 % IV SOLN: INTRAVENOUS | Status: DC

## 2019-12-19 NOTE — Progress Notes (Signed)
PROGRESS NOTE  [name] IRJ:[id number] DOB: 1924-03-18 DOA: 12/15/2019 PCP: [name], MD  HPI/Recap of past 24 hours: HPI from Dr [name] is a 86 y.o. female with medical history significant of moderate aortic stenosis, CAD s/p PCI, persistent A. Fib, HTN, HLD, hypothyroidism, low-grade lymphoma, chronic diastolic CHF presents to emergency department with generalized weakness since 1 week. Patient's son reports patient has been extremely weak since 1 week, unable to walk and fell last week however denies head trauma, seizure or loss of consciousness. Reports that since patient started on Eliquis couple of months ago due to A. fib she has multiple knots on her head.  Due to fall he called patient's cardiology who recommended to hold Eliquis for at least 3 days. The only change in her medicine recently is that her PCP increased her levothyroxine dose due to elevated TSH about 2 weeks ago. Pt lives alone at home however she has caregiver and son who takes care of her. In the ED, patient's BP was noted to be on lower side. CMP shows worsening kidney function.  Calcium of 14.0, troponin 28 trended down to 27, lactic acid: WNL, UA negative for infection, blood culture pending. CT head shows several scalp soft tissue masses, no infarction or hemorrhage.  Chest x-ray shows multiple opacities. [facility] hospital[facility] consulted for admission for further evaluation and management of hypercalcemia and generalized weakness.      Today, patient denies any new complaints, met patient sleeping, easily arousable.    Assessment/Plan: Principal Problem:   Generalized weakness Active Problems:   Hyperlipidemia   Hypothyroidism, goitrous   Coronary artery disease   Aortic valvular stenosis, moderate to severe   Essential hypertension   Acute on chronic kidney failure ([place])   Hypercalcemia   Palliative care by specialist   Goals of care, counseling/discussion   DNR (do not  resuscitate)   Hypercalcemia Unknown etiology, ??  Likely malignancy vs multiple myeloma PTH low, parathyroid related protein pending, vitamin D WNL, SPEP and UPEP pending Hold home calcium and vitamin D S/p IV fluids Discussed with oncology Dr. [name] on 12/19/2019, recommend IV Zometa 4 mg once, this should keep calcium levels WNL for weeks Daily BMP  AKI on CKD stage IIIb Creatinine around 1.4, on admission 2.34 S/p IV fluids Daily BMP  Hypotension BP improving Hold home antihypertensive for now, except for metoprolol for now due to A. fib with RVR Hold IV fluids for now  Generalized weakness/fall Multifactorial, hypercalcemia vs AKI vs hypotension vs possible malignancy PT/OT Fall precautions  Pulmonary nodules likely lung malignancy Multiple soft tissue mass/nodules on head CT chest showed numerous bilateral pulmonary nodules suspicious for metastatic disease CT abdomen/pelvis without contrast showed pathologic retroperitoneal, bilateral iliac and bilateral inguinal adenopathy consistent with metastatic disease Discussed with oncology Dr. [name] on 12/17/2019, recommend noninvasive investigations to determine the primary malignancy LDH elevated, flow cytometry pending Discussed with son, no further heroic measures  Hypothyroidism Synthroid dose was recently increased by PCP TSH low 0.189, free T4 high 1.36 Hold home Synthroid for now, plan to repeat TSH/free T4 and possibly reduce dose  Chronic diastolic HF/aortic valve stenosis Hold home Bumex for now due to AKI and dehydration Monitor closely  Persistent A. Fib with RVR Heart rate uncontrolled Restart home metoprolol, increased to 50 mg twice daily Restart Eliquis Telemetry  Dysphagia SLP on board  Goals of care discussion Patient with very poor prognosis Palliative consulted Discussed extensively with patient's son on 12/19/2019  about overall prognosis, requesting patient be discharged to SNF with possible  hospice support if patient continues to decline       Malnutrition Type:      Malnutrition Characteristics:      Nutrition Interventions:       Estimated body mass index is 21.28 kg/m as calculated from the following:   Height as of this encounter: '5\' 3"'$  (1.6 m).   Weight as of this encounter: 54.5 kg.     Code Status: DNR  Family Communication: Discussed with son and DIL extensively on 12/19/2019  Disposition Plan: Status is: Inpatient  Remains inpatient appropriate because:Inpatient level of care appropriate due to severity of illness   Dispo: The patient is from: Home              Anticipated d/c is to: SNF              Anticipated d/c date is: 2 days              Patient currently is not medically stable to d/c.     Consultants:  Discussed with oncology  Procedures:  None  Antimicrobials:  None  DVT prophylaxis: Lovenox   Objective: Vitals:   12/19/19 0402 12/19/19 0716 12/19/19 1159 12/19/19 1628  BP: (!) 108/63 (!) 129/60 (!) 117/50 (!) 110/51  Pulse: 69 66 73 79  Resp: '19 17 18 17  '$ Temp: 98 F (36.7 C) 97.8 F (36.6 C) 98.1 F (36.7 C) 97.9 F (36.6 C)  TempSrc: Oral Oral Oral Oral  SpO2: 95% 97% 95% 97%  Weight:      Height:        Intake/Output Summary (Last 24 hours) at 12/19/2019 1722 Last data filed at 12/18/2019 2119 Gross per 24 hour  Intake 240 ml  Output --  Net 240 ml   Filed Weights   12/17/19 0312 12/18/19 0332 12/19/19 0145  Weight: 55.7 kg 55.4 kg 54.5 kg    Exam:  General: NAD, oriented x3, knots noted on scalp, nontender  Cardiovascular: S1, S2 present  Respiratory: CTAB  Abdomen: Soft, nontender, nondistended, bowel sounds present  Musculoskeletal: No bilateral pedal edema noted  Skin: Normal  Psychiatry: Normal mood   Data Reviewed: CBC: Recent Labs  Lab 12/15/19 1005 12/15/19 1005 12/15/19 1422 12/16/19 0229 12/17/19 0257 12/18/19 0249 12/19/19 0335  WBC 7.9   < > 7.4 7.1 8.0  6.1 7.2  NEUTROABS 6.3  --   --   --  5.8 4.1 4.4  HGB 11.0*   < > 11.2* 10.0* 10.0* 9.7* 9.9*  HCT 35.8*   < > 36.7 32.2* 32.9* 32.1* 31.9*  MCV 92.5   < > 94.8 94.4 96.5 93.3 94.1  PLT 183   < > 192 178 164 171 182   < > = values in this interval not displayed.   Basic Metabolic Panel: Recent Labs  Lab 12/15/19 1005 12/15/19 1005 12/15/19 1422 12/16/19 0229 12/17/19 0257 12/18/19 0249 12/19/19 0335  NA 141  --   --  143 143 145 147*  K 4.1  --   --  4.3 3.9 3.7 3.4*  CL 105  --   --  107 113* 112* 114*  CO2 25  --   --  '26 22 24 25  '$ GLUCOSE 97  --   --  98 103* 88 132*  BUN 60*  --   --  61* 49* 44* 45*  CREATININE 2.34*   < > 2.35* 2.74* 2.42* 2.15*  1.91*  CALCIUM 14.0*  --  13.8* 12.6* 11.8* 11.8* 12.2*  MG  --   --  2.6*  --   --   --   --   PHOS  --   --  3.9  --   --   --   --    < > = values in this interval not displayed.   GFR: Estimated Creatinine Clearance: 14.9 mL/min (A) (by C-G formula based on SCr of 1.91 mg/dL (H)). Liver Function Tests: Recent Labs  Lab 12/15/19 1005 12/16/19 0229 12/17/19 0257 12/18/19 0249 12/19/19 0335  AST 32 '24 25 26 25  '$ ALT '20 17 17 17 17  '$ ALKPHOS 99 85 82 79 81  BILITOT 1.8* 1.4* 1.3* 1.4* 1.0  PROT 6.5 5.6* 5.5* 5.2* 5.3*  ALBUMIN 3.4* 2.8* 2.6* 2.5* 2.5*   No results for input(s): LIPASE, AMYLASE in the last 168 hours. No results for input(s): AMMONIA in the last 168 hours. Coagulation Profile: Recent Labs  Lab 12/15/19 1005  INR 1.2   Cardiac Enzymes: No results for input(s): CKTOTAL, CKMB, CKMBINDEX, TROPONINI in the last 168 hours. BNP (last 3 results) No results for input(s): PROBNP in the last 8760 hours. HbA1C: No results for input(s): HGBA1C in the last 72 hours. CBG: No results for input(s): GLUCAP in the last 168 hours. Lipid Profile: No results for input(s): CHOL, HDL, LDLCALC, TRIG, CHOLHDL, LDLDIRECT in the last 72 hours. Thyroid Function Tests: No results for input(s): TSH, T4TOTAL, FREET4,  T3FREE, THYROIDAB in the last 72 hours. Anemia Panel: No results for input(s): VITAMINB12, FOLATE, FERRITIN, TIBC, IRON, RETICCTPCT in the last 72 hours. Urine analysis:    Component Value Date/Time   COLORURINE YELLOW 12/15/2019 1154   APPEARANCEUR TURBID (A) 12/15/2019 1154   LABSPEC 1.010 12/15/2019 1154   PHURINE 7.0 12/15/2019 1154   GLUCOSEU NEGATIVE 12/15/2019 1154   HGBUR NEGATIVE 12/15/2019 1154   BILIRUBINUR NEGATIVE 12/15/2019 1154   KETONESUR NEGATIVE 12/15/2019 1154   PROTEINUR NEGATIVE 12/15/2019 1154   UROBILINOGEN 1.0 11/01/2012 1849   NITRITE NEGATIVE 12/15/2019 1154   LEUKOCYTESUR NEGATIVE 12/15/2019 1154   Sepsis Labs: '@LABRCNTIP'$ (procalcitonin:4,lacticidven:4)  ) Recent Results (from the past 240 hour(s))  Culture, blood (routine x 2)     Status: None (Preliminary result)   Collection Time: 12/15/19  2:22 PM   Specimen: BLOOD  Result Value Ref Range Status   Specimen Description BLOOD BLOOD LEFT FOREARM  Final   Special Requests   Final    BOTTLES DRAWN AEROBIC AND ANAEROBIC Blood Culture adequate volume   Culture   Final    NO GROWTH 4 DAYS Performed at [facility], [place] [address] [place], [place] [address]    Report Status PENDING  Incomplete  Culture, blood (routine x 2)     Status: None (Preliminary result)   Collection Time: 12/15/19  2:26 PM   Specimen: BLOOD  Result Value Ref Range Status   Specimen Description BLOOD RIGHT ANTECUBITAL  Final   Special Requests   Final    BOTTLES DRAWN AEROBIC AND ANAEROBIC Blood Culture results may not be optimal due to an inadequate volume of blood received in culture bottles   Culture   Final    NO GROWTH 4 DAYS Performed at [facility], [place] [address], [place], [place] [address]    Report Status PENDING  Incomplete  SARS Coronavirus 2 by RT PCR (hospital order, performed in [facility] hospital lab) Nasopharyngeal Nasopharyngeal Swab  Status: None   Collection Time: 12/15/19  2:46 PM    Specimen: Nasopharyngeal Swab  Result Value Ref Range Status   SARS Coronavirus 2 NEGATIVE NEGATIVE Final    Comment: (NOTE) SARS-CoV-2 target nucleic acids are NOT DETECTED.  The SARS-CoV-2 RNA is generally detectable in upper and lower respiratory specimens during the acute phase of infection. The lowest concentration of SARS-CoV-2 viral copies this assay can detect is 250 copies / mL. A negative result does not preclude SARS-CoV-2 infection and should not be used as the sole basis for treatment or other patient management decisions.  A negative result may occur with improper specimen collection / handling, submission of specimen other than nasopharyngeal swab, presence of viral mutation(s) within the areas targeted by this assay, and inadequate number of viral copies (<250 copies / mL). A negative result must be combined with clinical observations, patient history, and epidemiological information.  Fact Sheet for Patients:   StrictlyIdeas.no  Fact Sheet for Healthcare Providers: BankingDealers.co.za  This test is not yet approved or  cleared by the [place] FDA and has been authorized for detection and/or diagnosis of SARS-CoV-2 by FDA under an Emergency Use Authorization (EUA).  This EUA will remain in effect (meaning this test can be used) for the duration of the COVID-19 declaration under Section 564(b)(1) of the Act, 21 U.S.C. section 360bbb-3(b)(1), unless the authorization is terminated or revoked sooner.  Performed at [facility], [place] [address] [place], [place] [address]       Studies: No results found.  Scheduled Meds: . apixaban  2.5 mg Oral BID  . atorvastatin  40 mg Oral q1800  . ezetimibe  10 mg Oral Daily  . folic acid  2 mg Oral Daily  . metoprolol tartrate  50 mg Oral BID  . polyethylene glycol  17 g Oral BID  . senna  1 tablet Oral BID    Continuous Infusions: . sodium chloride 100 mL/hr  at 12/19/19 1110     LOS: 4 days     Alma Friendly, MD [facility]  If 7PM-7AM, please contact night-coverage [email or web address] 12/19/2019, 5:22 PM

## 2019-12-19 NOTE — Progress Notes (Signed)
° °  Palliative Medicine Inpatient Follow Up Note ° ° °Reason for consult:   °"Pt with metastatic disease, with other comorbidities. Please kindly see for further GOC discussion. Work up in progress to find out primary malignancy. Pt doesnt know yet. Son aware" °  °HPI:  °Per intake H&P --> [name] is a 86 y.o. female with medical history significant of moderate aortic stenosis, coronary artery disease status post PCI, persistent A. fib, hypertension, hyperlipidemia, hypothyroidism, low-grade lymphoma, chronic diastolic CHF presents to emergency department with generalized weakness since 1 week. °  °Palliative care was asked to get involved to aid in GOC conversations. ° °Today's Discussion (12/19/2019): °Chart reviewed. I met with [name] at bedside. Se was noted to be resting comfortably sitting up in her bed. Patients daughter in law, [name] and son, [name] were present at bedside. Dr. Ezenduka was speaking with them about the plan for [name]'s hypercalcemia.  ° °When Dr. Ezenduke left we completed a MOST form together. The patient and family outlined their wishes for the following treatment decisions: ° °Cardiopulmonary Resuscitation: Do Not Attempt Resuscitation (DNR/No CPR)  °Medical Interventions: Comfort Measures: Keep clean, warm, and dry. Use medication by any route, positioning, wound care, and other measures to relieve pain and suffering. Use oxygen, suction and manual treatment of airway obstruction as needed for comfort. Do not transfer to the hospital unless comfort needs cannot be met in current location.  °Antibiotics: No antibiotics (use other measures to relieve symptoms)  °IV Fluids: No IV fluids (provide other measures to ensure comfort)  °Feeding Tube: No feeding tube  ° °[name] requested information on how to fill in the HCPOA documents which was provided to her. The documents were completed though they will need to be notarized tomorrow.  ° °Discussed the importance of continued  conversation with family and their  medical providers regarding overall plan of care and treatment options, ensuring decisions are within the context of the patients values and GOCs. ° °Questions and concerns addressed  ° °SUMMARY OF RECOMMENDATIONS   °DNAR/DNI ° °MOST Completed, paper copy placed onto the chart electric copy can be found in the media section of epic ° °DNR Form Completed, paper copy placed onto the chart electric copy can be found in the media section of epic °  °TOC --> Placement at "[facility]" - Plan for palliative care to follow along have spoken to CM about this as [name] will eventually transition to hospice care ° °Chaplain Support --> Advanced Directive documents completed, need to be notarized tomorrow °  °Time Spent: 35 °Greater than 50% of the time was spent in counseling and coordination of care °______________________________________________________________________________________ °  °[facility] °Team Cell Phone: [phone] °Please utilize secure chat with additional questions, if there is no response within 30 minutes please call the above phone number ° °Palliative Medicine Team providers are available by phone from 7am to 7pm daily and can be reached through the team cell phone.  °Should this patient require assistance outside of these hours, please call the patient's attending physician. ° ° ° ° °

## 2019-12-19 NOTE — Social Work (Signed)
CSW was alerted patient's recommendation was a SNF now. CSW attempted to reach out to patient's son Sheran Spine). He messaged that he would call back. CSW is awaiting a call back.

## 2019-12-20 ENCOUNTER — Inpatient Hospital Stay (HOSPITAL_COMMUNITY): Payer: Medicare Other

## 2019-12-20 DIAGNOSIS — N179 Acute kidney failure, unspecified: Secondary | ICD-10-CM | POA: Diagnosis not present

## 2019-12-20 DIAGNOSIS — I350 Nonrheumatic aortic (valve) stenosis: Secondary | ICD-10-CM | POA: Diagnosis not present

## 2019-12-20 DIAGNOSIS — R531 Weakness: Secondary | ICD-10-CM | POA: Diagnosis not present

## 2019-12-20 DIAGNOSIS — I10 Essential (primary) hypertension: Secondary | ICD-10-CM | POA: Diagnosis not present

## 2019-12-20 LAB — CBC WITH DIFFERENTIAL/PLATELET
Abs Immature Granulocytes: 0.07 10*3/uL (ref 0.00–0.07)
Basophils Absolute: 0 10*3/uL (ref 0.0–0.1)
Basophils Relative: 0 %
Eosinophils Absolute: 0.3 10*3/uL (ref 0.0–0.5)
Eosinophils Relative: 4 %
HCT: 31.4 % — ABNORMAL LOW (ref 36.0–46.0)
Hemoglobin: 9.5 g/dL — ABNORMAL LOW (ref 12.0–15.0)
Immature Granulocytes: 1 %
Lymphocytes Relative: 15 %
Lymphs Abs: 1.2 10*3/uL (ref 0.7–4.0)
MCH: 28.4 pg (ref 26.0–34.0)
MCHC: 30.3 g/dL (ref 30.0–36.0)
MCV: 94 fL (ref 80.0–100.0)
Monocytes Absolute: 0.9 10*3/uL (ref 0.1–1.0)
Monocytes Relative: 10 %
Neutro Abs: 5.7 10*3/uL (ref 1.7–7.7)
Neutrophils Relative %: 70 %
Platelets: 175 10*3/uL (ref 150–400)
RBC: 3.34 MIL/uL — ABNORMAL LOW (ref 3.87–5.11)
RDW: 14.6 % (ref 11.5–15.5)
WBC: 8.2 10*3/uL (ref 4.0–10.5)
nRBC: 0 % (ref 0.0–0.2)

## 2019-12-20 LAB — COMPREHENSIVE METABOLIC PANEL
ALT: 21 U/L (ref 0–44)
AST: 28 U/L (ref 15–41)
Albumin: 2.3 g/dL — ABNORMAL LOW (ref 3.5–5.0)
Alkaline Phosphatase: 72 U/L (ref 38–126)
Anion gap: 7 (ref 5–15)
BUN: 41 mg/dL — ABNORMAL HIGH (ref 8–23)
CO2: 24 mmol/L (ref 22–32)
Calcium: 11.3 mg/dL — ABNORMAL HIGH (ref 8.9–10.3)
Chloride: 115 mmol/L — ABNORMAL HIGH (ref 98–111)
Creatinine, Ser: 1.68 mg/dL — ABNORMAL HIGH (ref 0.44–1.00)
GFR calc Af Amer: 30 mL/min — ABNORMAL LOW (ref >60)
GFR calc non Af Amer: 26 mL/min — ABNORMAL LOW (ref >60)
Glucose, Bld: 101 mg/dL — ABNORMAL HIGH (ref 70–99)
Potassium: 3.6 mmol/L (ref 3.5–5.1)
Sodium: 146 mmol/L — ABNORMAL HIGH (ref 135–145)
Total Bilirubin: 1.1 mg/dL (ref 0.3–1.2)
Total Protein: 4.9 g/dL — ABNORMAL LOW (ref 6.5–8.1)

## 2019-12-20 LAB — PROTEIN ELECTROPHORESIS, SERUM
A/G Ratio: 1.1 (ref 0.7–1.7)
Albumin ELP: 2.9 g/dL (ref 2.9–4.4)
Alpha-1-Globulin: 0.2 g/dL (ref 0.0–0.4)
Alpha-2-Globulin: 0.7 g/dL (ref 0.4–1.0)
Beta Globulin: 0.9 g/dL (ref 0.7–1.3)
Gamma Globulin: 0.9 g/dL (ref 0.4–1.8)
Globulin, Total: 2.7 g/dL (ref 2.2–3.9)
M-Spike, %: 0.2 g/dL — ABNORMAL HIGH
Total Protein ELP: 5.6 g/dL — ABNORMAL LOW (ref 6.0–8.5)

## 2019-12-20 LAB — SARS CORONAVIRUS 2 (TAT 6-24 HRS): SARS Coronavirus 2: NEGATIVE

## 2019-12-20 LAB — CULTURE, BLOOD (ROUTINE X 2)
Culture: NO GROWTH
Culture: NO GROWTH
Special Requests: ADEQUATE

## 2019-12-20 LAB — FLOW CYTOMETRY REQUEST - FLUID (INPATIENT)

## 2019-12-20 MED ORDER — ORAL CARE MOUTH RINSE
15.0000 mL | Freq: Two times a day (BID) | OROMUCOSAL | Status: DC
  Administered 2019-12-21 – 2019-12-22 (×2): 15 mL via OROMUCOSAL

## 2019-12-20 MED ORDER — DEXTROSE-NACL 5-0.45 % IV SOLN: INTRAVENOUS | Status: DC

## 2019-12-20 MED ORDER — DEXTROSE-NACL 5-0.45 % IV SOLN: INTRAVENOUS | Status: AC

## 2019-12-20 MED ORDER — LEVOTHYROXINE SODIUM 50 MCG PO TABS
50.0000 ug | ORAL_TABLET | Freq: Every day | ORAL | Status: DC
  Administered 2019-12-21 – 2019-12-23 (×3): 50 ug via ORAL
  Filled 2019-12-20 (×3): qty 1

## 2019-12-20 NOTE — Progress Notes (Signed)
   12/20/19 1000  Clinical Encounter Type  Visited With Patient;Health care provider  Visit Type Initial  Referral From Palliative care team  Consult/Referral To Chaplain   Chaplain responded to consult for advanced directive to be notarized. RN said not needed. Pt has MOST form. Chaplains available for support as needs arise.   Chaplain Resident, Evelene Croon, MDiv [phone] on-call pager

## 2019-12-20 NOTE — Progress Notes (Signed)
Physical Therapy Treatment Patient Details Name: [name] MRN: [id number] DOB: 04/28/1924 Today's Date: 12/20/2019    History of Present Illness 86 y.o. female presenting with generalized weakness x1 wk, dehydration and hypercalcemia. PMHx significant for aortic stenosis, CAD s/p PCI, A-fib, HTN, HLD, hypothyroidism, low grade lymphoma, and chronic diastolic CHF.     PT Comments    Pt very pleasant and wanting to walk on arrival. Pt able to walk to bathroom for BM and then walk in hallway. Pt continues to benefit from RW use but needs cues and assist for use due to limited vision. Pt able to initiate and perform bil LE HEP with cues for seated activity vs standing at this time. Decreased gait from eval this session due to fatigue as pt reports she has not mobilized for a couple days. Pt aware of son unable to provide additional care at home and family request for SNF at D/C. Will continue to follow and pt encouraged to mobilize with staff daily.   94% on RA HR 78    Follow Up Recommendations  Supervision for mobility/OOB;SNF (family declined ability to provide care for return home)     Equipment Recommendations  Rolling walker with 5" wheels    Recommendations for Other Services       Precautions / Restrictions Precautions Precautions: Fall    Mobility  Bed Mobility Overal bed mobility: Modified Independent             General bed mobility comments: no assisted needed with use of bed rails and elevated HOB  Transfers Overall transfer level: Needs assistance   Transfers: Sit to/from Stand Sit to Stand: Min guard         General transfer comment: cues for hand placement with RW present to push from surface and cues for rail use at toilet  Ambulation/Gait Ambulation/Gait assistance: Min assist Gait Distance (Feet): 160 Feet Assistive device: Rolling walker (2 wheeled) Gait Pattern/deviations: Step-through pattern;Decreased stride length;Trunk flexed;Wide  base of support   Gait velocity interpretation: >2.62 ft/sec, indicative of community ambulatory General Gait Details: pt with min assist to direct RW due to limited vision and pt running into obstacles, pt able to self limit distance appropriately. cues for proximity to [employer]    Modified Rankin (Stroke Patients Only)       Balance Overall balance assessment: Needs assistance   Sitting balance-Leahy Scale: Good Sitting balance - Comments: EOb and at toilet without LOB     Standing balance-Leahy Scale: Fair Standing balance comment: pt able to stand at sink to wash hands without support, bil UE support for gait                            Cognition Arousal/Alertness: Awake/alert Behavior During Therapy: WFL for tasks assessed/performed Overall Cognitive Status: Impaired/Different from baseline Area of Impairment: Safety/judgement                         Safety/Judgement: Decreased awareness of safety     General Comments: pt with decreased awareness of positioining in RW and awareness of environment limited by vision. Linens soaked on arrival with pt unaware as purewick not connected to suction      Exercises General Exercises - Lower Extremity Long Arc Quad: AROM;Both;Seated;20 reps Hip ABduction/ADduction: AROM;Both;Seated;20 reps Hip Flexion/Marching: AROM;Both;Seated;20 reps  General Comments        Pertinent Vitals/Pain Pain Assessment: No/denies pain    Home Living                      Prior Function            PT Goals (current goals can now be found in the care plan section) Progress towards PT goals: Progressing toward goals    Frequency    Min 2X/week      PT Plan Discharge plan needs to be updated;Frequency needs to be updated    Co-evaluation              AM-PAC PT "6 Clicks" Mobility   Outcome Measure  Help needed turning from your back to your side  while in a flat bed without using bedrails?: None Help needed moving from lying on your back to sitting on the side of a flat bed without using bedrails?: None Help needed moving to and from a bed to a chair (including a wheelchair)?: A Little Help needed standing up from a chair using your arms (e.g., wheelchair or bedside chair)?: A Little Help needed to walk in hospital room?: A Little Help needed climbing 3-5 steps with a railing? : A Little 6 Click Score: 20    End of Session Equipment Utilized During Treatment: Gait belt Activity Tolerance: Patient tolerated treatment well Patient left: in chair;with call bell/phone within reach;with chair alarm set Nurse Communication: Mobility status PT Visit Diagnosis: Other abnormalities of gait and mobility (R26.89);Muscle weakness (generalized) (M62.81)     Time: 4098-1191 PT Time Calculation (min) (ACUTE ONLY): 23 min  Charges:  $Gait Training: 8-22 mins $Therapeutic Exercise: 8-22 mins                     [place], PT Acute Rehabilitation Services Pager: [phone] Office: [place] 12/20/2019, 1:38 PM

## 2019-12-20 NOTE — NC FL2 (Addendum)
[place] LEVEL OF CARE SCREENING TOOL     IDENTIFICATION  Patient Name: [name] Birthdate: Aug 22, 1923 Sex: female Admission Date (Current Location): 12/15/2019  [facility] [place] Number:  Herbalist and Address:  [facility], [place] [address], [place], [place] [address]      Provider Number: [id number]  Attending Physician Name and Address:  Alma Friendly, MD  Relative Name and Phone Number:       Current Level of Care: Hospital Recommended Level of Care: [place] Prior Approval Number:    Date Approved/Denied:   PASRR Number: [id number]  Discharge Plan: SNF    Current Diagnoses: Patient Active Problem List   Diagnosis Date Noted   Palliative care by specialist    Goals of care, counseling/discussion    DNR (do not resuscitate)    Generalized weakness 12/15/2019   Acute on chronic kidney failure ([place]) 12/15/2019   Hypercalcemia 12/15/2019   Advanced nonexudative age-related macular degeneration of both eyes with subfoveal involvement 11/22/2019   Minimal cognitive impairment 08/03/2019   Permanent atrial fibrillation ([place]) 06/14/2019   Atrial flutter ([place]) 06/01/2019   Drug allergy    Acute on chronic diastolic CHF (congestive heart failure) ([place]) 05/30/2019   Atrial fibrillation with RVR ([place]) 04/25/2018   CKD (chronic kidney disease) stage 3, GFR 30-59 ml/min 04/25/2018   Anemia 04/25/2018   Supratherapeutic INR 04/25/2018   Acute gastroenteritis 12/25/2017   Aspiration pneumonia of left lower lobe due to vomit ([place]) 12/25/2017   Allergy to sulfa drugs 10/02/2016   History of fracture of right hip 04/15/2015   Senile purpura ([place]) 04/13/2015   Angioimmunoblastic lymphoma ([place]) 04/05/2015   Arthritis 09/13/2014   Intermittent LBBB 08/16/2014   Drug-induced bradycardia 02/20/2014   Prolonged Q-T interval on ECG 11/02/2012   Hypotension 11/01/2012    Leukocytosis 11/01/2012   Pleural effusion 10/11/2011   Aortic valvular stenosis, moderate to severe 06/24/2011   Essential hypertension 06/24/2011   Hyperlipidemia 06/21/2011   Hypertensive heart disease with CHF (congestive heart failure) ([place]) 06/21/2011   Hypothyroidism, goitrous 06/21/2011   Coronary artery disease 06/21/2011    Orientation RESPIRATION BLADDER Height & Weight     Self, Place    Incontinent, External catheter Weight: 125 lb 10.6 oz (57 kg) Height:  5\' 3"  (160 cm)  BEHAVIORAL SYMPTOMS/MOOD NEUROLOGICAL BOWEL NUTRITION STATUS      Continent Diet (please see discharge summary)  AMBULATORY STATUS COMMUNICATION OF NEEDS Skin   Supervision Verbally Normal                       Personal Care Assistance Level of Assistance  Bathing, Dressing, Feeding Bathing Assistance: Limited assistance Feeding assistance: Independent Dressing Assistance: Limited assistance     Functional Limitations Info  Sight, Hearing, Speech Sight Info: Adequate Hearing Info: Adequate Speech Info: Adequate    SPECIAL CARE FACTORS FREQUENCY  PT (By licensed PT), OT (By licensed OT)     PT Frequency: 5x per wek OT Frequency: 5x per week            Contractures Contractures Info: Not present    Additional Factors Info  Code Status, Allergies Code Status Info: DNR Allergies Info: [name],Red Dye, Furosemide,sulfa antibiotics, tape,[name],Torsemide           Current Medications (12/20/2019):  This is the current hospital active medication list Current Facility-Administered Medications  Medication Dose Route Frequency Provider Last Rate Last Admin  acetaminophen (TYLENOL) tablet 650 mg  650 mg Oral Q6H PRN Pahwani, Rinka R, MD   650 mg at 12/17/19 2003   Or   acetaminophen (TYLENOL) suppository 650 mg  650 mg Rectal Q6H PRN Pahwani, Rinka R, MD       apixaban (ELIQUIS) tablet 2.5 mg  2.5 mg Oral BID Alma Friendly, MD   2.5 mg at 12/20/19  0855   atorvastatin (LIPITOR) tablet 40 mg  40 mg Oral q1800 Pahwani, Rinka R, MD   40 mg at 12/19/19 1704   dextrose 5 %-0.45 % sodium chloride infusion   Intravenous Continuous Alma Friendly, MD       diphenhydrAMINE (BENADRYL) capsule 25 mg  25 mg Oral QHS PRN Chotiner, Yevonne Aline, MD   25 mg at 12/17/19 2151   ezetimibe (ZETIA) tablet 10 mg  10 mg Oral Daily Pahwani, Rinka R, MD   10 mg at 96/04/54 0981   folic acid (FOLVITE) tablet 2 mg  2 mg Oral Daily Pahwani, Rinka R, MD   2 mg at 12/20/19 0855   levothyroxine (SYNTHROID) tablet 50 mcg  50 mcg Oral Q0600 Alma Friendly, MD       metoprolol tartrate (LOPRESSOR) tablet 50 mg  50 mg Oral BID Alma Friendly, MD   50 mg at 12/20/19 0855   polyethylene glycol (MIRALAX / GLYCOLAX) packet 17 g  17 g Oral BID Alma Friendly, MD   17 g at 12/20/19 1914   Resource ThickenUp Clear   Oral PRN Alma Friendly, MD       senna (SENOKOT) tablet 8.6 mg  1 tablet Oral BID Alma Friendly, MD   8.6 mg at 12/19/19 1057     Discharge Medications: Please see discharge summary for a list of discharge medications.  Relevant Imaging Results:  Relevant Lab Results:   Additional Information SSN [id number] with palliative care following at [facility], LCSWA

## 2019-12-20 NOTE — Care Management Important Message (Signed)
Important Message  Patient Details  Name: [name] MRN: [id number] Date of Birth: 04/18/1924   Medicare Important Message Given:  Yes     Shelda Altes 12/20/2019, 9:39 AM

## 2019-12-20 NOTE — TOC Initial Note (Signed)
Transition of Care [facility]) - Initial/Assessment Note    Patient Details  Name: [name] MRN: [id number] Date of Birth: 02-16-1924  [facility] CM/SW Contact:    Vinie Sill, [place] Phone Number: 12/20/2019, 12:30 PM  Clinical Narrative:                  CSW spoke with patient's son, [name]. CSW introduced self and explained role. CSW informed of PT recommendations, possible short term rehab before discharging home. Patient's son was agreeable to SNF placement. [facility] is preferred SNF. CSW was given permission to send referral to other SNFs. [name], states no questions or concerns at this time.  CSW called [facility], left voice message to return call.   Thurmond Butts, MSW, [place] Clinical Social Worker    Expected Discharge Plan: Skilled Nursing Facility Barriers to Discharge: SNF Pending bed offer, Insurance Authorization   Patient Goals and CMS Choice        Expected Discharge Plan and Services Expected Discharge Plan: [place] In-house Referral: Clinical Social Work     Living arrangements for the past 2 months: [place]                                      Prior Living Arrangements/Services Living arrangements for the past 2 months: Single Family Home Lives with:: Self Patient language and need for interpreter reviewed:: No        Need for Family Participation in Patient Care: Yes (Comment) Care giver support system in place?: Yes (comment)   Criminal Activity/Legal Involvement Pertinent to Current Situation/Hospitalization: No - Comment as needed  Activities of Daily Living      Permission Sought/Granted Permission sought to share information with : Family Supports    Share Information with NAME: [name]  Permission granted to share info w AGENCY: SNFs  Permission granted to share info w Relationship: son  Permission granted to share info w Contact Information:  [phone]  Emotional Assessment   Attitude/Demeanor/Rapport: Unable to Assess Affect (typically observed): Unable to Assess Orientation: : Oriented to Self, Oriented to Place Alcohol / Substance Use: Not Applicable Psych Involvement: No (comment)  Admission diagnosis:  Generalized weakness [R53.1] Patient Active Problem List   Diagnosis Date Noted  . Palliative care by specialist   . Goals of care, counseling/discussion   . DNR (do not resuscitate)   . Generalized weakness 12/15/2019  . Acute on chronic kidney failure ([place]) 12/15/2019  . Hypercalcemia 12/15/2019  . Advanced nonexudative age-related macular degeneration of both eyes with subfoveal involvement 11/22/2019  . Minimal cognitive impairment 08/03/2019  . Permanent atrial fibrillation ([place]) 06/14/2019  . Atrial flutter ([place]) 06/01/2019  . Drug allergy   . Acute on chronic diastolic CHF (congestive heart failure) ([place]) 05/30/2019  . Atrial fibrillation with RVR ([place]) 04/25/2018  . CKD (chronic kidney disease) stage 3, GFR 30-59 ml/min 04/25/2018  . Anemia 04/25/2018  . Supratherapeutic INR 04/25/2018  . Acute gastroenteritis 12/25/2017  . Aspiration pneumonia of left lower lobe due to vomit ([place]) 12/25/2017  . Allergy to sulfa drugs 10/02/2016  . History of fracture of right hip 04/15/2015  . Senile purpura ([place]) 04/13/2015  . Angioimmunoblastic lymphoma ([place]) 04/05/2015  . Arthritis 09/13/2014  . Intermittent LBBB 08/16/2014  . Drug-induced bradycardia 02/20/2014  . Prolonged Q-T interval on ECG 11/02/2012  . Hypotension 11/01/2012  . Leukocytosis 11/01/2012  .  Pleural effusion 10/11/2011  . Aortic valvular stenosis, moderate to severe 06/24/2011  . Essential hypertension 06/24/2011    Class: Present on Admission  . Hyperlipidemia 06/21/2011  . Hypertensive heart disease with CHF (congestive heart failure) ([place]) 06/21/2011  . Hypothyroidism, goitrous 06/21/2011  . Coronary artery disease 06/21/2011   PCP:   [name], MD Pharmacy:   [id number] - [place], [place] - [address] [place] [address] [place] [address] Phone: [phone] Fax: [phone]     Social Determinants of Health (SDOH) Interventions    Readmission Risk Interventions No flowsheet data found.

## 2019-12-20 NOTE — Progress Notes (Signed)
PROGRESS NOTE  [name] TGP:[id number] DOB: 07/19/1923 DOA: 12/15/2019 PCP: [name], MD  HPI/Recap of past 24 hours: HPI from Dr [name] is a 86 y.o. female with medical history significant of moderate aortic stenosis, CAD s/p PCI, persistent A. Fib, HTN, HLD, hypothyroidism, low-grade lymphoma, chronic diastolic CHF presents to emergency department with generalized weakness since 1 week. Patient's son reports patient has been extremely weak since 1 week, unable to walk and fell last week however denies head trauma, seizure or loss of consciousness. Reports that since patient started on Eliquis couple of months ago due to A. fib she has multiple knots on her head.  Due to fall he called patient's cardiology who recommended to hold Eliquis for at least 3 days. The only change in her medicine recently is that her PCP increased her levothyroxine dose due to elevated TSH about 2 weeks ago. Pt lives alone at home however she has caregiver and son who takes care of her. In the ED, patient's BP was noted to be on lower side. CMP shows worsening kidney function.  Calcium of 14.0, troponin 28 trended down to 27, lactic acid: WNL, UA negative for infection, blood culture pending. CT head shows several scalp soft tissue masses, no infarction or hemorrhage.  Chest x-ray shows multiple opacities. [facility] hospitalist consulted for admission for further evaluation and management of hypercalcemia and generalized weakness.      Today, met patient at bedside, very pleasant.  Denied any new complaints    Assessment/Plan: Principal Problem:   Generalized weakness Active Problems:   Hyperlipidemia   Hypothyroidism, goitrous   Coronary artery disease   Aortic valvular stenosis, moderate to severe   Essential hypertension   Acute on chronic kidney failure ([place])   Hypercalcemia   Palliative care by specialist   Goals of care, counseling/discussion   DNR (do not  resuscitate)   Hypercalcemia Unknown etiology, ??  Likely malignancy vs multiple myeloma PTH low, parathyroid related protein pending, vitamin D WNL, SPEP and UPEP pending Hold home calcium and vitamin D S/p IV fluids Discussed with oncology Dr. [name] on 12/19/2019, recommend IV Zometa 4 mg once, this should keep calcium levels WNL for weeks Daily BMP  AKI on CKD stage IIIb Creatinine around 1.4, on admission 2.34 IV fluids Daily BMP  Hypotension BP improving Hold home antihypertensive for now, except for metoprolol for now due to A. fib with RVR IV fluids for now  Generalized weakness/fall Multifactorial, hypercalcemia vs AKI vs hypotension vs possible malignancy PT/OT Fall precautions  Pulmonary nodules likely lung malignancy Multiple soft tissue mass/nodules on head CT chest showed numerous bilateral pulmonary nodules suspicious for metastatic disease CT abdomen/pelvis without contrast showed pathologic retroperitoneal, bilateral iliac and bilateral inguinal adenopathy consistent with metastatic disease Discussed with oncology Dr. [name] on 12/17/2019, recommend noninvasive investigations to determine the primary malignancy LDH elevated, flow cytometry pending Discussed with son, no further heroic measures  Hypothyroidism Synthroid dose was recently increased by PCP TSH low 0.189, free T4 high 1.36 Restart Synthroid at a lower dose, 50 mcg daily  Chronic diastolic HF/aortic valve stenosis Hold home Bumex for now due to AKI and dehydration Monitor closely  Persistent A. Fib with RVR Heart rate uncontrolled Restart home metoprolol, increased to 50 mg twice daily Restart Eliquis Telemetry  Dysphagia SLP on board  Goals of care discussion Patient with very poor prognosis Palliative consulted Discussed extensively with patient's son on 12/19/2019 about overall prognosis, requesting patient be  discharged to SNF with possible hospice support if patient continues to  decline       Malnutrition Type:      Malnutrition Characteristics:      Nutrition Interventions:       Estimated body mass index is 22.26 kg/m as calculated from the following:   Height as of this encounter: '5\' 3"'$  (1.6 m).   Weight as of this encounter: 57 kg.     Code Status: DNR  Family Communication: Discussed with son and DIL extensively on 12/19/2019  Disposition Plan: Status is: Inpatient  Remains inpatient appropriate because:Inpatient level of care appropriate due to severity of illness   Dispo: The patient is from: Home              Anticipated d/c is to: SNF              Anticipated d/c date is: 2 days              Patient currently is not medically stable to d/c.     Consultants:  Discussed with oncology  Procedures:  None  Antimicrobials:  None  DVT prophylaxis: Lovenox   Objective: Vitals:   12/20/19 0321 12/20/19 0338 12/20/19 0728 12/20/19 1142  BP: (!) 125/53  (!) 140/70 (!) 115/55  Pulse: 69  90 68  Resp: 22  (!) 24 17  Temp: 98.5 F (36.9 C)  97.8 F (36.6 C) 98 F (36.7 C)  TempSrc: Oral  Oral Oral  SpO2: 95%  95% 96%  Weight:  57 kg    Height:        Intake/Output Summary (Last 24 hours) at 12/20/2019 1548 Last data filed at 12/20/2019 1300 Gross per 24 hour  Intake 2292.67 ml  Output 500 ml  Net 1792.67 ml   Filed Weights   12/18/19 0332 12/19/19 0145 12/20/19 0338  Weight: 55.4 kg 54.5 kg 57 kg    Exam:  General: NAD, oriented x3, knots noted on scalp, nontender  Cardiovascular: S1, S2 present  Respiratory: CTAB  Abdomen: Soft, nontender, nondistended, bowel sounds present  Musculoskeletal: No bilateral pedal edema noted  Skin: Normal  Psychiatry: Normal mood   Data Reviewed: CBC: Recent Labs  Lab 12/15/19 1005 12/15/19 1422 12/16/19 0229 12/17/19 0257 12/18/19 0249 12/19/19 0335 12/20/19 0308  WBC 7.9   < > 7.1 8.0 6.1 7.2 8.2  NEUTROABS 6.3  --   --  5.8 4.1 4.4 5.7  HGB 11.0*    < > 10.0* 10.0* 9.7* 9.9* 9.5*  HCT 35.8*   < > 32.2* 32.9* 32.1* 31.9* 31.4*  MCV 92.5   < > 94.4 96.5 93.3 94.1 94.0  PLT 183   < > 178 164 171 182 175   < > = values in this interval not displayed.   Basic Metabolic Panel: Recent Labs  Lab 12/15/19 1005 12/15/19 1422 12/16/19 0229 12/17/19 0257 12/18/19 0249 12/19/19 0335 12/20/19 0308  NA   < >  --  143 143 145 147* 146*  K   < >  --  4.3 3.9 3.7 3.4* 3.6  CL   < >  --  107 113* 112* 114* 115*  CO2   < >  --  '26 22 24 25 24  '$ GLUCOSE   < >  --  98 103* 88 132* 101*  BUN   < >  --  61* 49* 44* 45* 41*  CREATININE   < > 2.35* 2.74* 2.42* 2.15* 1.91* 1.68*  CALCIUM   < >  13.8* 12.6* 11.8* 11.8* 12.2* 11.3*  MG  --  2.6*  --   --   --   --   --   PHOS  --  3.9  --   --   --   --   --    < > = values in this interval not displayed.   GFR: Estimated Creatinine Clearance: 16.9 mL/min (A) (by C-G formula based on SCr of 1.68 mg/dL (H)). Liver Function Tests: Recent Labs  Lab 12/16/19 0229 12/17/19 0257 12/18/19 0249 12/19/19 0335 12/20/19 0308  AST '24 25 26 25 28  '$ ALT '17 17 17 17 21  '$ ALKPHOS 85 82 79 81 72  BILITOT 1.4* 1.3* 1.4* 1.0 1.1  PROT 5.6* 5.5* 5.2* 5.3* 4.9*  ALBUMIN 2.8* 2.6* 2.5* 2.5* 2.3*   No results for input(s): LIPASE, AMYLASE in the last 168 hours. No results for input(s): AMMONIA in the last 168 hours. Coagulation Profile: Recent Labs  Lab 12/15/19 1005  INR 1.2   Cardiac Enzymes: No results for input(s): CKTOTAL, CKMB, CKMBINDEX, TROPONINI in the last 168 hours. BNP (last 3 results) No results for input(s): PROBNP in the last 8760 hours. HbA1C: No results for input(s): HGBA1C in the last 72 hours. CBG: No results for input(s): GLUCAP in the last 168 hours. Lipid Profile: No results for input(s): CHOL, HDL, LDLCALC, TRIG, CHOLHDL, LDLDIRECT in the last 72 hours. Thyroid Function Tests: No results for input(s): TSH, T4TOTAL, FREET4, T3FREE, THYROIDAB in the last 72 hours. Anemia Panel: No  results for input(s): VITAMINB12, FOLATE, FERRITIN, TIBC, IRON, RETICCTPCT in the last 72 hours. Urine analysis:    Component Value Date/Time   COLORURINE YELLOW 12/15/2019 1154   APPEARANCEUR TURBID (A) 12/15/2019 1154   LABSPEC 1.010 12/15/2019 1154   PHURINE 7.0 12/15/2019 1154   GLUCOSEU NEGATIVE 12/15/2019 1154   HGBUR NEGATIVE 12/15/2019 1154   BILIRUBINUR NEGATIVE 12/15/2019 1154   KETONESUR NEGATIVE 12/15/2019 1154   PROTEINUR NEGATIVE 12/15/2019 1154   UROBILINOGEN 1.0 11/01/2012 1849   NITRITE NEGATIVE 12/15/2019 1154   LEUKOCYTESUR NEGATIVE 12/15/2019 1154   Sepsis Labs: '@LABRCNTIP'$ (procalcitonin:4,lacticidven:4)  ) Recent Results (from the past 240 hour(s))  Culture, blood (routine x 2)     Status: None   Collection Time: 12/15/19  2:22 PM   Specimen: BLOOD  Result Value Ref Range Status   Specimen Description BLOOD BLOOD LEFT FOREARM  Final   Special Requests   Final    BOTTLES DRAWN AEROBIC AND ANAEROBIC Blood Culture adequate volume   Culture   Final    NO GROWTH 5 DAYS Performed at [facility], [place] [address], [place], [place] [address]    Report Status 12/20/2019 FINAL  Final  Culture, blood (routine x 2)     Status: None   Collection Time: 12/15/19  2:26 PM   Specimen: BLOOD  Result Value Ref Range Status   Specimen Description BLOOD RIGHT ANTECUBITAL  Final   Special Requests   Final    BOTTLES DRAWN AEROBIC AND ANAEROBIC Blood Culture results may not be optimal due to an inadequate volume of blood received in culture bottles   Culture   Final    NO GROWTH 5 DAYS Performed at [facility], [place] [address], [place], [place] [address]    Report Status 12/20/2019 FINAL  Final  SARS Coronavirus 2 by RT PCR (hospital order, performed in [facility] hospital lab) Nasopharyngeal Nasopharyngeal Swab     Status: None   Collection Time:  12/15/19  2:46 PM   Specimen: Nasopharyngeal Swab  Result Value Ref Range Status   SARS Coronavirus 2 NEGATIVE  NEGATIVE Final    Comment: (NOTE) SARS-CoV-2 target nucleic acids are NOT DETECTED.  The SARS-CoV-2 RNA is generally detectable in upper and lower respiratory specimens during the acute phase of infection. The lowest concentration of SARS-CoV-2 viral copies this assay can detect is 250 copies / mL. A negative result does not preclude SARS-CoV-2 infection and should not be used as the sole basis for treatment or other patient management decisions.  A negative result may occur with improper specimen collection / handling, submission of specimen other than nasopharyngeal swab, presence of viral mutation(s) within the areas targeted by this assay, and inadequate number of viral copies (<250 copies / mL). A negative result must be combined with clinical observations, patient history, and epidemiological information.  Fact Sheet for Patients:   StrictlyIdeas.no  Fact Sheet for Healthcare Providers: BankingDealers.co.za  This test is not yet approved or  cleared by the [place] FDA and has been authorized for detection and/or diagnosis of SARS-CoV-2 by FDA under an Emergency Use Authorization (EUA).  This EUA will remain in effect (meaning this test can be used) for the duration of the COVID-19 declaration under Section 564(b)(1) of the Act, 21 U.S.C. section 360bbb-3(b)(1), unless the authorization is terminated or revoked sooner.  Performed at [facility], [place] [address] [place], [place] [address]       Studies: DG Swallowing Func-Speech Pathology  Result Date: 12/20/2019 Objective Swallowing Evaluation: Type of Study: MBS-Modified Barium Swallow Study  Patient Details Name: [name] MRN: [id number] Date of Birth: 10-21-1923 Today's Date: 12/20/2019 Time: SLP Start Time (ACUTE ONLY): 1045 -SLP Stop Time (ACUTE ONLY): 1102 SLP Time Calculation (min) (ACUTE ONLY): 17 min Past Medical History: Past Medical History:  Diagnosis Date .  Acute respiratiory failure requiring BiPap 06/24/2011 . Allergy  . Aortic valvular stenosis, moderate to severe 06/24/2011  a. 10/2012 Echo: mod AS, Valve 1.25 cm^2 (VTI), 1.19cm^2 (Vmax). . Arthritis   "back" (10/30/2012) . Chronic diastolic CHF (congestive heart failure) ([place])   a. 10/2012 Echo: EF 55-60%, no rwma, Gr 2 DD, moderate AS, mod dil LA, mildly to mod dil RA. Marland Kitchen Coronary artery disease   a. 03/2009 PCI RCA (3.0x18 BMS). . Dyslipidemia  . Exertional shortness of breath  . Hypertension  . Hypothyroidism  . PAF (paroxysmal atrial fibrillation) ([place])   a. CHA2DS2VASc = 7-->chronic coumadin;  b. s/p DCCV 05/2011, 05/2012, 07/2014;  c. On amio. . Rash   a. felt to be 2/2 lasix/torsemide in setting of sulfa allergy (lasix d/c ~ 06/2014, torsemide d/c 08/09/2014). Past Surgical History: Past Surgical History: Procedure Laterality Date . ANGIOPLASTY   . APPENDECTOMY   . BACK SURGERY   . CARDIAC CATHETERIZATION  10/06/2009  Continue medical therapy . CARDIAC CATHETERIZATION  04/07/2009  RCA stented with a 3x70m stent resulting in a reduction of 90% narrowing to normal . CARDIOVERSION  06/19/2011  Procedure: CARDIOVERSION;  Surgeon: MSanda Klein MD;  [facility];  Service: Cardiovascular;  Laterality: N/A; . CARDIOVERSION  07/11/2010  Successful coversion to sinus rhythm . CARDIOVERSION N/A 08/10/2014  Procedure: CARDIOVERSION;  Surgeon: MSanda Klein MD;  Location: [facility];  Service: Cardiovascular;  Laterality: N/A; . CATARACT EXTRACTION W/ INTRAOCULAR LENS  IMPLANT, BILATERAL   . CORONARY ANGIOPLASTY WITH STENT PLACEMENT    "1" (10/30/2012) . DILATION AND CURETTAGE OF UTERUS   . EXCISIONAL HEMORRHOIDECTOMY   .  FRACTURE SURGERY   . HIP ARTHROPLASTY Right 04/17/2015  Procedure: HIP MONOPOLAR HIP HEMI ARTHROPLASTY;  Surgeon: Marybelle Killings, MD;  Location: [facility];  Service: Orthopedics;  Laterality: Right; . IR THORACENTESIS ASP PLEURAL SPACE W/IMG GUIDE  04/28/2018 . IR THORACENTESIS ASP PLEURAL SPACE  W/IMG GUIDE  05/26/2018 . LUMBAR DISC SURGERY  X 2  "cleaned out scar tissue"  . SHOULDER ACROMIOPLASTY Left   "fell; put a new ball in" (10/30/2012) . WRIST FRACTURE SURGERY Left  HPI:  86 y.o. female with medical history significant of moderate aortic stenosis, CAD s/p PCI, persistent A. Fib, HTN, HLD, hypothyroidism, low-grade lymphoma, chronic diastolic CHF presents to emergency department with generalized weakness since 1 week. Patient's son reports patient has been extremely weak since 1 week, unable to walk and fell last week however denies head trauma, seizure or loss of consciousness. Reports that since patient started on Eliquis couple of months ago due to A. fib she has multiple knots on her head. CT head shows several scalp soft tissue masses, no infarction or hemorrhage.  CT chest shows multile nodules and adenopathy on CT abdomen suggestive of metastatic disease. BSE completed on 12/19/19 with indication for MBS with diet recommendation of Dysphagia 1/nectar-thickened liquids.  No data recorded Assessment / Plan / Recommendation CHL IP CLINICAL IMPRESSIONS 12/20/2019 Clinical Impression Pt demonstrated oropharyngeal dysphagia characterized by decreased bolus preparation/propulsion and min oral holding prior to swallow iniatiation; delayed iniation of the swallow (could be contributed to presbyphagia) with initiation at valleculae due to decreased BOT retraction/pharyngeal weakness; deep penetration and eventual aspiration noted with thin via larger cup sips/straw with smaller sips/tsp amounts not aspirated during MBS, but pt is at risk with meals d/t vallecular residue present during study.  Nectar-thickened liquids did not enter airway during study despite volume.  Pt had mild-mod vallecular residue cleared to min to insignificant amounts with repetitive, effortful swallows during study.  Recommend upgrading diet to Dysphagia 2/nectar-thickened liquids with ST allowing thin via tsp/small sips during ST tx  only until pt improves pharyngeal strength via use of dysphagia tx.  Without aspiration precautions/swallowing strategies, pt is at mod risk for aspiration during meals. SLP Visit Diagnosis Dysphagia, oropharyngeal phase (R13.12) Attention and concentration deficit following -- Frontal lobe and executive function deficit following -- Impact on safety and function Mild aspiration risk;Moderate aspiration risk   CHL IP TREATMENT RECOMMENDATION 12/20/2019 Treatment Recommendations Therapy as outlined in treatment plan below   Prognosis 12/20/2019 Prognosis for Safe Diet Advancement Good Barriers to Reach Goals -- Barriers/Prognosis Comment -- CHL IP DIET RECOMMENDATION 12/20/2019 SLP Diet Recommendations Dysphagia 2 (Fine chop) solids;Nectar thick liquid;Other (Comment) Liquid Administration via Cup;Straw;Other (Comment) Medication Administration Whole meds with puree Compensations Slow rate;Small sips/bites;Effortful swallow;Clear throat intermittently;Multiple dry swallows after each bite/sip Postural Changes --   CHL IP OTHER RECOMMENDATIONS 12/20/2019 Recommended Consults -- Oral Care Recommendations Oral care BID Other Recommendations --   CHL IP FOLLOW UP RECOMMENDATIONS 12/20/2019 Follow up Recommendations 24 hour supervision/assistance   CHL IP FREQUENCY AND DURATION 12/20/2019 Speech Therapy Frequency (ACUTE ONLY) min 2x/week Treatment Duration 2 weeks      CHL IP ORAL PHASE 12/20/2019 Oral Phase Impaired Oral - Pudding Teaspoon -- Oral - Pudding Cup -- Oral - Honey Teaspoon -- Oral - Honey Cup -- Oral - Nectar Teaspoon Premature spillage Oral - Nectar Cup Premature spillage Oral - Nectar Straw -- Oral - Thin Teaspoon Premature spillage Oral - Thin Cup Premature spillage Oral - Thin Straw Premature spillage Oral -  Puree -- Oral - Mech Soft Delayed oral transit;Other (Comment) Oral - Regular -- Oral - Multi-Consistency -- Oral - Pill -- Oral Phase - Comment (No Data)  CHL IP PHARYNGEAL PHASE 12/20/2019 Pharyngeal Phase  Impaired Pharyngeal- Pudding Teaspoon -- Pharyngeal -- Pharyngeal- Pudding Cup -- Pharyngeal -- Pharyngeal- Honey Teaspoon -- Pharyngeal -- Pharyngeal- Honey Cup -- Pharyngeal -- Pharyngeal- Nectar Teaspoon -- Pharyngeal -- Pharyngeal- Nectar Cup Pharyngeal residue - valleculae;Compensatory strategies attempted (with notebox);Delayed swallow initiation-vallecula;Reduced tongue base retraction Pharyngeal -- Pharyngeal- Nectar Straw Reduced tongue base retraction;Pharyngeal residue - valleculae;Compensatory strategies attempted (with notebox) Pharyngeal -- Pharyngeal- Thin Teaspoon Reduced tongue base retraction;Pharyngeal residue - valleculae Pharyngeal -- Pharyngeal- Thin Cup Reduced tongue base retraction;Reduced airway/laryngeal closure;Penetration/Aspiration during swallow;Moderate aspiration;Pharyngeal residue - valleculae;Pharyngeal residue - pyriform;Compensatory strategies attempted (with notebox) Pharyngeal Material enters airway, remains ABOVE vocal cords and not ejected out Pharyngeal- Thin Straw -- Pharyngeal -- Pharyngeal- Puree Reduced tongue base retraction;Pharyngeal residue - valleculae;Compensatory strategies attempted (with notebox) Pharyngeal -- Pharyngeal- Mechanical Soft Pharyngeal residue - valleculae;Delayed swallow initiation-vallecula;Reduced tongue base retraction Pharyngeal -- Pharyngeal- Regular -- Pharyngeal -- Pharyngeal- Multi-consistency -- Pharyngeal -- Pharyngeal- Pill -- Pharyngeal -- Pharyngeal Comment --  CHL IP CERVICAL ESOPHAGEAL PHASE 12/20/2019 Cervical Esophageal Phase WFL Pudding Teaspoon -- Pudding Cup -- Honey Teaspoon -- Honey Cup -- Nectar Teaspoon -- Nectar Cup -- Nectar Straw -- Thin Teaspoon -- Thin Cup -- Thin Straw -- Puree -- Mechanical Soft -- Regular -- Multi-consistency -- Pill -- Cervical Esophageal Comment -- Elvina Sidle, M.S., CCC-SLP 12/20/2019, 3:41 PM               Scheduled Meds: . apixaban  2.5 mg Oral BID  . atorvastatin  40 mg Oral q1800  .  ezetimibe  10 mg Oral Daily  . folic acid  2 mg Oral Daily  . levothyroxine  50 mcg Oral Q0600  . metoprolol tartrate  50 mg Oral BID  . polyethylene glycol  17 g Oral BID  . senna  1 tablet Oral BID    Continuous Infusions: . dextrose 5 % and 0.45% NaCl       LOS: 5 days     [place] Friendly, MD [facility]  If 7PM-7AM, please contact night-coverage [email or web address] 12/20/2019, 3:48 PM

## 2019-12-20 NOTE — TOC Progression Note (Addendum)
Transition of Care [facility]) - Progression Note    Patient Details  Name: [name] MRN: [id number] Date of Birth: 1923-12-11  Transition of Care [facility]) CM/SW [place], [place] Phone Number: 12/20/2019, 3:00 PM  Clinical Narrative:     [facility] confirmed bed offer. CSW started insurance authorization.   CSW informed patient's son, [facility] confirmed bed offer.   covid test requested  Thurmond Butts, MSW, LCSWA Clinical Social Worker    Expected Discharge Plan: Skilled Nursing Facility Barriers to Discharge: SNF Pending bed offer, Insurance Authorization  Expected Discharge Plan and Services Expected Discharge Plan: [place] In-house Referral: Clinical Social Work     Living arrangements for the past 2 months: Single Family Home                                       Social Determinants of Health (SDOH) Interventions    Readmission Risk Interventions No flowsheet data found.

## 2019-12-20 NOTE — Consult Note (Signed)
Modified Barium Swallow Progress Note  Patient Details  Name: [name] MRN: [id number] Date of Birth: Jun 08, 1923  Today's Date: 12/20/2019  Modified Barium Swallow completed.  Full report located under Chart Review in the Imaging Section.  Brief recommendations include the following:  Clinical Impression  Pt demonstrated oropharyngeal dysphagia characterized by decreased bolus preparation/propulsion and min oral holding prior to swallow iniatiation; delayed iniation of the swallow (could be contributed to presbyphagia) with initiation at valleculae due to decreased BOT retraction/pharyngeal weakness; deep penetration and eventual aspiration noted with thin via larger cup sips/straw with smaller sips/tsp amounts not aspirated during MBS, but pt is at risk with meals d/t vallecular residue present during study.  Pt had mild-mod vallecular residue cleared to min with repetitive effortful swallows during study.  Recommend upgrading diet to Dysphagia 2/nectar-thickened liquids with ST allowing thin via tsp/small sips during ST only until pt improves pharyngeal strength via use of dysphagia tx.  Without aspiration precautions/swallowing strategies, pt is at mod risk for aspiration during meals.   Swallow Evaluation Recommendations       SLP Diet Recommendations: Dysphagia 2 (Fine chop) solids;Nectar thick liquid;Other (Comment) (thin allowed only with ST during tx sessions)   Liquid Administration via: Cup;Straw;Other (Comment) ((nectar))   Medication Administration: Whole meds with puree   Supervision: Patient able to self feed;Staff to assist with self feeding;Full supervision/cueing for compensatory strategies   Compensations: Slow rate;Small sips/bites;Effortful swallow;Clear throat intermittently;Multiple dry swallows after each bite/sip       Oral Care Recommendations: Oral care BID        Elvina Sidle, M.S., CCC-SLP 12/20/2019,3:45 PM

## 2019-12-21 DIAGNOSIS — C799 Secondary malignant neoplasm of unspecified site: Secondary | ICD-10-CM | POA: Diagnosis not present

## 2019-12-21 DIAGNOSIS — I350 Nonrheumatic aortic (valve) stenosis: Secondary | ICD-10-CM | POA: Diagnosis not present

## 2019-12-21 DIAGNOSIS — Z515 Encounter for palliative care: Secondary | ICD-10-CM | POA: Diagnosis not present

## 2019-12-21 DIAGNOSIS — N179 Acute kidney failure, unspecified: Secondary | ICD-10-CM | POA: Diagnosis not present

## 2019-12-21 DIAGNOSIS — R531 Weakness: Secondary | ICD-10-CM | POA: Diagnosis not present

## 2019-12-21 DIAGNOSIS — I10 Essential (primary) hypertension: Secondary | ICD-10-CM | POA: Diagnosis not present

## 2019-12-21 DIAGNOSIS — Z7189 Other specified counseling: Secondary | ICD-10-CM | POA: Diagnosis not present

## 2019-12-21 LAB — COMPREHENSIVE METABOLIC PANEL
ALT: 26 U/L (ref 0–44)
AST: 35 U/L (ref 15–41)
Albumin: 2.3 g/dL — ABNORMAL LOW (ref 3.5–5.0)
Alkaline Phosphatase: 76 U/L (ref 38–126)
Anion gap: 7 (ref 5–15)
BUN: 38 mg/dL — ABNORMAL HIGH (ref 8–23)
CO2: 25 mmol/L (ref 22–32)
Calcium: 11.2 mg/dL — ABNORMAL HIGH (ref 8.9–10.3)
Chloride: 113 mmol/L — ABNORMAL HIGH (ref 98–111)
Creatinine, Ser: 1.54 mg/dL — ABNORMAL HIGH (ref 0.44–1.00)
GFR calc Af Amer: 33 mL/min — ABNORMAL LOW (ref >60)
GFR calc non Af Amer: 29 mL/min — ABNORMAL LOW (ref >60)
Glucose, Bld: 107 mg/dL — ABNORMAL HIGH (ref 70–99)
Potassium: 3.7 mmol/L (ref 3.5–5.1)
Sodium: 145 mmol/L (ref 135–145)
Total Bilirubin: 1.2 mg/dL (ref 0.3–1.2)
Total Protein: 4.8 g/dL — ABNORMAL LOW (ref 6.5–8.1)

## 2019-12-21 LAB — CBC WITH DIFFERENTIAL/PLATELET
Abs Immature Granulocytes: 0 10*3/uL (ref 0.00–0.07)
Basophils Absolute: 0 10*3/uL (ref 0.0–0.1)
Basophils Relative: 0 %
Eosinophils Absolute: 0.3 10*3/uL (ref 0.0–0.5)
Eosinophils Relative: 4 %
HCT: 31.6 % — ABNORMAL LOW (ref 36.0–46.0)
Hemoglobin: 9.6 g/dL — ABNORMAL LOW (ref 12.0–15.0)
Lymphocytes Relative: 7 %
Lymphs Abs: 0.5 10*3/uL — ABNORMAL LOW (ref 0.7–4.0)
MCH: 28.9 pg (ref 26.0–34.0)
MCHC: 30.4 g/dL (ref 30.0–36.0)
MCV: 95.2 fL (ref 80.0–100.0)
Monocytes Absolute: 0.4 10*3/uL (ref 0.1–1.0)
Monocytes Relative: 5 %
Neutro Abs: 6.3 10*3/uL (ref 1.7–7.7)
Neutrophils Relative %: 84 %
Platelets: 181 10*3/uL (ref 150–400)
RBC: 3.32 MIL/uL — ABNORMAL LOW (ref 3.87–5.11)
RDW: 15 % (ref 11.5–15.5)
WBC: 7.5 10*3/uL (ref 4.0–10.5)
nRBC: 0 % (ref 0.0–0.2)
nRBC: 0 /100 WBC

## 2019-12-21 LAB — SURGICAL PATHOLOGY

## 2019-12-21 NOTE — Progress Notes (Signed)
Palliative:  HPI: 86 y.o.femalewith medical history significant ofmoderate aortic stenosis, coronary artery disease status post PCI,persistent A. fib,hypertension, hyperlipidemia, hypothyroidism, low-grade lymphoma, chronic diastolic CHF presents to emergency department with generalized weakness since 1 week. Found to have evidence of nodules to lungs and head with concern for mets throughout abd as well.   I met today with [name]. She is sitting up in bed and feeding herself lunch. She is alert, interactive, and pleasant. She denies any concerns or discomforts. She gives me permission to speak with her daughter-in-law, [name]. She requests that I tell [name] "tell her I am doing well today."  I returned a call to [name]. [name] has questions about her mother-in-law's diagnosis of cancer and I did discuss the potential options of multiple myeloma vs possible lung primary with mets but without biopsy we may never know truly the etiology although I have not discussed with oncology. [name] confirms that they would not want to put her through biopsy. They are also requesting assistance with form to be completed to be approved for long term care insurance to cover facility. I will assist and provided [name] with my [facility] email to assist with this form when it is received. I also further reiterated that hospice can more easily assist as Medicare would pay for hospice to follow if long term care policy will cover the facility stay. Plans are still to begin with rehab. We did discuss signs of general decline with sleeping more and less interactive, declining intake, weakening state. Hopeful that outpatient palliative care assist with where they are in end of life process and transition to hospice when the time is right.   All questions/concerns addressed. Emotional support provided.   Exam: Thin, frail, elderly. Alert, oriented, pleasant. Multiple knots noted to head. HR normal. Breathing regular, unlabored. Abd  flat. Warm to touch.   Plan: - Awaiting transition to SNF rehab with outpatient palliative to follow.   [place], NP Palliative Medicine Team Pager [phone] (Please see [email or web address] for schedule) Team Phone [phone]    Greater than 50%  of this time was spent counseling and coordinating care related to the above assessment and plan

## 2019-12-21 NOTE — Progress Notes (Signed)
   12/21/19 0900  Clinical Encounter Type  Visited With Health care provider;Patient  Visit Type Follow-up  Referral From Palliative care team  Consult/Referral To [facility] located paperwork for advance directive on the front of the chart with the assistance of Chaplain Dorian Pod. Orbie Pyo, 2 volunteers and Notary completed paperwork with Leaira this morning. Notarized paperwork is in the front of Mackensi's chart with a copy papercliped to it for the family. Chaplains remain available for support as needs arise.   Chaplain Resident, Evelene Croon, M Div [phone] on-call pager

## 2019-12-21 NOTE — Progress Notes (Signed)
Occupational Therapy Treatment Patient Details Name: [name] MRN: [id number] DOB: Feb 09, 1924 Today's Date: 12/21/2019    History of present illness 86 y.o. female presenting with generalized weakness x1 wk, dehydration and hypercalcemia. PMHx significant for aortic stenosis, CAD s/p PCI, A-fib, HTN, HLD, hypothyroidism, low grade lymphoma, and chronic diastolic CHF.    OT comments  Patient continues to make steady progress towards goals in skilled OT session. Patient's session encompassed ADLs and functional mobility in order to increase overall activity tolerance. Pt fatigued upon arrival, however motivated to brush teeth. Pt able to complete at min guard level (only requiring assist to located toothpaste vs. Barrier cream). Pt then able to ambulate to bathroom and back, with no cues needed to positioning or to locate grab bars needed. Pt with noteable fatigue after ambulation to bathroom, requesting to go back to bed. Discharge remains appropriate at this time; will continue to follow acutely.    Follow Up Recommendations  SNF;Supervision/Assistance - 24 hour    Equipment Recommendations  3 in 1 bedside commode    Recommendations for Other Services      Precautions / Restrictions Precautions Precautions: Fall Precaution Comments: watch HR Restrictions Weight Bearing Restrictions: No       Mobility Bed Mobility Overal bed mobility: Modified Independent             General bed mobility comments: no assisted needed with use of bed rails and elevated HOB  Transfers Overall transfer level: Needs assistance Equipment used: Rolling walker (2 wheeled) Transfers: Sit to/from Stand Sit to Stand: Min guard         General transfer comment: Cues to manuever around environment in bathroom but recalled using grab bars from previous session with PT    Balance Overall balance assessment: Needs assistance Sitting-balance support: Feet supported;No upper extremity  supported Sitting balance-Leahy Scale: Good Sitting balance - Comments: EOb and at toilet without LOB   Standing balance support: Bilateral upper extremity supported Standing balance-Leahy Scale: Fair Standing balance comment: pt able to stand at sink to wash hands without support, bil UE support for gait                           ADL either performed or assessed with clinical judgement   ADL Overall ADL's : Needs assistance/impaired     Grooming: Wash/dry hands;Wash/dry face;Oral care;Standing;Cueing for safety;Minimal assistance Grooming Details (indicate cue type and reason): Standing at sink, min A only to located toothpaste vs barrier cream                 Toilet Transfer: Minimal assistance;Ambulation;RW;Regular Toilet;Grab bars   Toileting- Clothing Manipulation and Hygiene: Maximal assistance;Sit to/from stand Toileting - Clothing Manipulation Details (indicate cue type and reason): max A for throughness for peri-care due to fatigue     Functional mobility during ADLs: Minimal assistance General ADL Comments: Pt able to ambulate to sink, bathroom and back to bed to complete ADLs, fatigue limiting factor in session, HR stable throughout     Vision       Perception     Praxis      Cognition Arousal/Alertness: Awake/alert Behavior During Therapy: WFL for tasks assessed/performed Overall Cognitive Status: Impaired/Different from baseline Area of Impairment: Safety/judgement                         Safety/Judgement: Decreased awareness of safety (Impulsive)     General Comments: pt  with decreased awareness of positioining in RW and awareness of environment limited by vision. Linens soaked on arrival with pt unaware as purewick not connected to suction        Exercises     Shoulder Instructions       General Comments      Pertinent Vitals/ Pain       Pain Assessment: No/denies pain Pain Score: 0-No pain  Home Living                                           Prior Functioning/Environment              Frequency  Min 3X/week        Progress Toward Goals  OT Goals(current goals can now be found in the care plan section)  Progress towards OT goals: Progressing toward goals  Acute Rehab OT Goals Patient Stated Goal: to take a nap OT Goal Formulation: With patient Time For Goal Achievement: 12/30/19 Potential to Achieve Goals: Good  Plan Discharge plan remains appropriate    Co-evaluation                 AM-PAC OT "6 Clicks" Daily Activity     Outcome Measure   Help from another person eating meals?: None Help from another person taking care of personal grooming?: A Little Help from another person toileting, which includes using toliet, bedpan, or urinal?: A Little Help from another person bathing (including washing, rinsing, drying)?: A Little Help from another person to put on and taking off regular upper body clothing?: None Help from another person to put on and taking off regular lower body clothing?: A Little 6 Click Score: 20    End of Session Equipment Utilized During Treatment: Gait belt;Rolling walker  OT Visit Diagnosis: Unsteadiness on feet (R26.81);History of falling (Z91.81)   Activity Tolerance Patient tolerated treatment well   Patient Left in bed;with call bell/phone within reach;with bed alarm set   Nurse Communication Mobility status        Time: 7482-7078 OT Time Calculation (min): 26 min  Charges: OT General Charges $OT Visit: 1 Visit OT Treatments $Self Care/Home Management : 23-37 mins  Baker. Eboney [place], COTA/L Acute Rehabilitation Services [place] 12/21/2019, 2:27 PM

## 2019-12-21 NOTE — TOC Progression Note (Signed)
Transition of Care [facility]) - Progression Note    Patient Details  Name: [name] MRN: [id number] Date of Birth: 06-Oct-1923  Transition of Care [facility]) CM/SW [place], [place] Phone Number: 12/21/2019, 1:42 PM  Clinical Narrative:     Insurance authorization still pending- refer # [id number]  Thurmond Butts, MSW, [place] Clinical Social Worker   Expected Discharge Plan: Skilled Nursing Facility Barriers to Discharge: SNF Pending bed offer, Insurance Authorization  Expected Discharge Plan and Services Expected Discharge Plan: [place] In-house Referral: Clinical Social Work     Living arrangements for the past 2 months: Single Family Home                                       Social Determinants of Health (SDOH) Interventions    Readmission Risk Interventions No flowsheet data found.

## 2019-12-21 NOTE — Progress Notes (Signed)
PROGRESS NOTE  [name] URK:[id number] DOB: August 11, 1923 DOA: 12/15/2019 PCP: [name], MD  HPI/Recap of past 24 hours: HPI from Dr [name] is a 86 y.o. female with medical history significant of moderate aortic stenosis, CAD s/p PCI, persistent A. Fib, HTN, HLD, hypothyroidism, low-grade lymphoma, chronic diastolic CHF presents to emergency department with generalized weakness since 1 week. Patient's son reports patient has been extremely weak since 1 week, unable to walk and fell last week however denies head trauma, seizure or loss of consciousness. Reports that since patient started on Eliquis couple of months ago due to A. fib she has multiple knots on her head.  Due to fall he called patient's cardiology who recommended to hold Eliquis for at least 3 days. The only change in her medicine recently is that her PCP increased her levothyroxine dose due to elevated TSH about 2 weeks ago. Pt lives alone at home however she has caregiver and son who takes care of her. In the ED, patient's BP was noted to be on lower side. CMP shows worsening kidney function.  Calcium of 14.0, troponin 28 trended down to 27, lactic acid: WNL, UA negative for infection, blood culture pending. CT head shows several scalp soft tissue masses, no infarction or hemorrhage.  Chest x-ray shows multiple opacities. [facility] hospitalist consulted for admission for further evaluation and management of hypercalcemia and generalized weakness.      Today, pt denies any new complaints. Met patient resting comfortably.    Assessment/Plan: Principal Problem:   Generalized weakness Active Problems:   Hyperlipidemia   Hypothyroidism, goitrous   Coronary artery disease   Aortic valvular stenosis, moderate to severe   Essential hypertension   Acute on chronic kidney failure ([place])   Hypercalcemia   Palliative care by specialist   Goals of care, counseling/discussion   DNR (do not resuscitate)    Metastatic malignant neoplasm ([place])   Hypercalcemia Unknown etiology, ??  Likely malignancy vs multiple myeloma PTH low, parathyroid related protein pending, vitamin D WNL, SPEP and UPEP pending Hold home calcium and vitamin D S/p IV fluids Discussed with oncology Dr. [name] on 12/19/2019, recommend IV Zometa 4 mg once, this should keep calcium levels WNL for weeks Daily BMP  AKI on CKD stage IIIb Creatinine around 1.4, on admission 2.34 Daily BMP  Hypotension BP improving Hold home antihypertensive for now, except for metoprolol for now due to A. fib with RVR IV fluids for now  Generalized weakness/fall Multifactorial, hypercalcemia vs AKI vs hypotension vs possible malignancy PT/OT Fall precautions  Pulmonary nodules likely lung malignancy Multiple soft tissue mass/nodules on head CT chest showed numerous bilateral pulmonary nodules suspicious for metastatic disease CT abdomen/pelvis without contrast showed pathologic retroperitoneal, bilateral iliac and bilateral inguinal adenopathy consistent with metastatic disease Discussed with oncology Dr. [name] on 12/17/2019, recommend noninvasive investigations to determine the primary malignancy LDH elevated, flow cytometry pending (Dr [name] will follow up result and inform family) Discussed with son, no further heroic measures  Hypothyroidism Synthroid dose was recently increased by PCP TSH low 0.189, free T4 high 1.36 Restart Synthroid at a lower dose, 50 mcg daily  Chronic diastolic HF/aortic valve stenosis Hold home Bumex for now due to AKI and dehydration Monitor closely  Persistent A. Fib with RVR Heart rate uncontrolled Restart home metoprolol, increased to 50 mg twice daily Restart Eliquis Telemetry  Dysphagia SLP on board  Goals of care discussion Patient with very poor prognosis Palliative consulted Discussed extensively  with patient's son on 12/19/2019 about overall prognosis, requesting patient be discharged  to SNF with possible outpatient palliative/hospice support if patient continues to decline       Malnutrition Type:      Malnutrition Characteristics:      Nutrition Interventions:       Estimated body mass index is 23.16 kg/m as calculated from the following:   Height as of this encounter: '5\' 3"'$  (1.6 m).   Weight as of this encounter: 59.3 kg.     Code Status: DNR  Family Communication: Discussed with son and DIL extensively on 12/19/2019  Disposition Plan: Status is: Inpatient  Remains inpatient appropriate because:Inpatient level of care appropriate due to severity of illness   Dispo: The patient is from: Home              Anticipated d/c is to: SNF              Anticipated d/c date is: 2 days              Patient currently is medically stable to d/c.     Consultants:  Discussed with oncology  Procedures:  None  Antimicrobials:  None  DVT prophylaxis: Lovenox   Objective: Vitals:   12/21/19 0752 12/21/19 1202 12/21/19 1632 12/21/19 1900  BP: (!) 124/58 111/66 (!) 107/52   Pulse: 71 65 66   Resp: '20 23 21   '$ Temp: 98 F (36.7 C) 97.9 F (36.6 C) 98.3 F (36.8 C) 98 F (36.7 C)  TempSrc: Oral Oral Oral Oral  SpO2: 96% 95% 97%   Weight:      Height:        Intake/Output Summary (Last 24 hours) at 12/21/2019 2110 Last data filed at 12/21/2019 1745 Gross per 24 hour  Intake 420 ml  Output 400 ml  Net 20 ml   Filed Weights   12/19/19 0145 12/20/19 0338 12/21/19 0500  Weight: 54.5 kg 57 kg 59.3 kg    Exam:  General: NAD, oriented x3, knots noted on scalp, nontender  Cardiovascular: S1, S2 present  Respiratory: CTAB  Abdomen: Soft, nontender, nondistended, bowel sounds present  Musculoskeletal: No bilateral pedal edema noted  Skin: Normal  Psychiatry: Normal mood   Data Reviewed: CBC: Recent Labs  Lab 12/17/19 0257 12/18/19 0249 12/19/19 0335 12/20/19 0308 12/21/19 0234  WBC 8.0 6.1 7.2 8.2 7.5  NEUTROABS 5.8  4.1 4.4 5.7 6.3  HGB 10.0* 9.7* 9.9* 9.5* 9.6*  HCT 32.9* 32.1* 31.9* 31.4* 31.6*  MCV 96.5 93.3 94.1 94.0 95.2  PLT 164 171 182 175 562   Basic Metabolic Panel: Recent Labs  Lab 12/15/19 1422 12/16/19 0229 12/17/19 0257 12/18/19 0249 12/19/19 0335 12/20/19 0308 12/21/19 0234  NA  --    < > 143 145 147* 146* 145  K  --    < > 3.9 3.7 3.4* 3.6 3.7  CL  --    < > 113* 112* 114* 115* 113*  CO2  --    < > '22 24 25 24 25  '$ GLUCOSE  --    < > 103* 88 132* 101* 107*  BUN  --    < > 49* 44* 45* 41* 38*  CREATININE 2.35*   < > 2.42* 2.15* 1.91* 1.68* 1.54*  CALCIUM 13.8*   < > 11.8* 11.8* 12.2* 11.3* 11.2*  MG 2.6*  --   --   --   --   --   --   PHOS 3.9  --   --   --   --   --   --    < > =  values in this interval not displayed.   GFR: Estimated Creatinine Clearance: 18.5 mL/min (A) (by C-G formula based on SCr of 1.54 mg/dL (H)). Liver Function Tests: Recent Labs  Lab 12/17/19 0257 12/18/19 0249 12/19/19 0335 12/20/19 0308 12/21/19 0234  AST '25 26 25 28 '$ 35  ALT '17 17 17 21 26  '$ ALKPHOS 82 79 81 72 76  BILITOT 1.3* 1.4* 1.0 1.1 1.2  PROT 5.5* 5.2* 5.3* 4.9* 4.8*  ALBUMIN 2.6* 2.5* 2.5* 2.3* 2.3*   No results for input(s): LIPASE, AMYLASE in the last 168 hours. No results for input(s): AMMONIA in the last 168 hours. Coagulation Profile: Recent Labs  Lab 12/15/19 1005  INR 1.2   Cardiac Enzymes: No results for input(s): CKTOTAL, CKMB, CKMBINDEX, TROPONINI in the last 168 hours. BNP (last 3 results) No results for input(s): PROBNP in the last 8760 hours. HbA1C: No results for input(s): HGBA1C in the last 72 hours. CBG: No results for input(s): GLUCAP in the last 168 hours. Lipid Profile: No results for input(s): CHOL, HDL, LDLCALC, TRIG, CHOLHDL, LDLDIRECT in the last 72 hours. Thyroid Function Tests: No results for input(s): TSH, T4TOTAL, FREET4, T3FREE, THYROIDAB in the last 72 hours. Anemia Panel: No results for input(s): VITAMINB12, FOLATE, FERRITIN, TIBC,  IRON, RETICCTPCT in the last 72 hours. Urine analysis:    Component Value Date/Time   COLORURINE YELLOW 12/15/2019 1154   APPEARANCEUR TURBID (A) 12/15/2019 1154   LABSPEC 1.010 12/15/2019 1154   PHURINE 7.0 12/15/2019 1154   GLUCOSEU NEGATIVE 12/15/2019 1154   HGBUR NEGATIVE 12/15/2019 1154   BILIRUBINUR NEGATIVE 12/15/2019 1154   KETONESUR NEGATIVE 12/15/2019 1154   PROTEINUR NEGATIVE 12/15/2019 1154   UROBILINOGEN 1.0 11/01/2012 1849   NITRITE NEGATIVE 12/15/2019 1154   LEUKOCYTESUR NEGATIVE 12/15/2019 1154   Sepsis Labs: '@LABRCNTIP'$ (procalcitonin:4,lacticidven:4)  ) Recent Results (from the past 240 hour(s))  Culture, blood (routine x 2)     Status: None   Collection Time: 12/15/19  2:22 PM   Specimen: BLOOD  Result Value Ref Range Status   Specimen Description BLOOD BLOOD LEFT FOREARM  Final   Special Requests   Final    BOTTLES DRAWN AEROBIC AND ANAEROBIC Blood Culture adequate volume   Culture   Final    NO GROWTH 5 DAYS Performed at [facility], [place] [address] [place], [place] [address]    Report Status 12/20/2019 FINAL  Final  Culture, blood (routine x 2)     Status: None   Collection Time: 12/15/19  2:26 PM   Specimen: BLOOD  Result Value Ref Range Status   Specimen Description BLOOD RIGHT ANTECUBITAL  Final   Special Requests   Final    BOTTLES DRAWN AEROBIC AND ANAEROBIC Blood Culture results may not be optimal due to an inadequate volume of blood received in culture bottles   Culture   Final    NO GROWTH 5 DAYS Performed at [facility], [place] [address] [place], [place] [address]    Report Status 12/20/2019 FINAL  Final  SARS Coronavirus 2 by RT PCR (hospital order, performed in [facility] hospital lab) Nasopharyngeal Nasopharyngeal Swab     Status: None   Collection Time: 12/15/19  2:46 PM   Specimen: Nasopharyngeal Swab  Result Value Ref Range Status   SARS Coronavirus 2 NEGATIVE NEGATIVE Final    Comment: (NOTE) SARS-CoV-2 target  nucleic acids are NOT DETECTED.  The SARS-CoV-2 RNA is generally detectable in upper and lower respiratory specimens during the acute phase  of infection. The lowest concentration of SARS-CoV-2 viral copies this assay can detect is 250 copies / mL. A negative result does not preclude SARS-CoV-2 infection and should not be used as the sole basis for treatment or other patient management decisions.  A negative result may occur with improper specimen collection / handling, submission of specimen other than nasopharyngeal swab, presence of viral mutation(s) within the areas targeted by this assay, and inadequate number of viral copies (<250 copies / mL). A negative result must be combined with clinical observations, patient history, and epidemiological information.  Fact Sheet for Patients:   StrictlyIdeas.no  Fact Sheet for Healthcare Providers: BankingDealers.co.za  This test is not yet approved or  cleared by the [place] FDA and has been authorized for detection and/or diagnosis of SARS-CoV-2 by FDA under an Emergency Use Authorization (EUA).  This EUA will remain in effect (meaning this test can be used) for the duration of the COVID-19 declaration under Section 564(b)(1) of the Act, 21 U.S.C. section 360bbb-3(b)(1), unless the authorization is terminated or revoked sooner.  Performed at [facility], [place] [address], [place], [place] [address]   SARS CORONAVIRUS 2 (TAT 6-24 HRS) Nasopharyngeal Nasopharyngeal Swab     Status: None   Collection Time: 12/20/19  3:35 PM   Specimen: Nasopharyngeal Swab  Result Value Ref Range Status   SARS Coronavirus 2 NEGATIVE NEGATIVE Final    Comment: (NOTE) SARS-CoV-2 target nucleic acids are NOT DETECTED.  The SARS-CoV-2 RNA is generally detectable in upper and lower respiratory specimens during the acute phase of infection. Negative results do not preclude SARS-CoV-2 infection, do not  rule out co-infections with other pathogens, and should not be used as the sole basis for treatment or other patient management decisions. Negative results must be combined with clinical observations, patient history, and epidemiological information. The expected result is Negative.  Fact Sheet for Patients: SugarRoll.be  Fact Sheet for Healthcare Providers: https://www.woods-mathews.com/  This test is not yet approved or cleared by the [place] FDA and  has been authorized for detection and/or diagnosis of SARS-CoV-2 by FDA under an Emergency Use Authorization (EUA). This EUA will remain  in effect (meaning this test can be used) for the duration of the COVID-19 declaration under Se ction 564(b)(1) of the Act, 21 U.S.C. section 360bbb-3(b)(1), unless the authorization is terminated or revoked sooner.  Performed at [facility], [place] [address] [place], [place] [address]       Studies: No results found.  Scheduled Meds: . apixaban  2.5 mg Oral BID  . atorvastatin  40 mg Oral q1800  . ezetimibe  10 mg Oral Daily  . folic acid  2 mg Oral Daily  . levothyroxine  50 mcg Oral Q0600  . mouth rinse  15 mL Mouth Rinse q12n4p  . metoprolol tartrate  50 mg Oral BID  . polyethylene glycol  17 g Oral BID  . senna  1 tablet Oral BID    Continuous Infusions:    LOS: 6 days     Alma Friendly, MD [facility]  If 7PM-7AM, please contact night-coverage [email or web address] 12/21/2019, 9:10 PM

## 2019-12-22 DIAGNOSIS — E782 Mixed hyperlipidemia: Secondary | ICD-10-CM

## 2019-12-22 DIAGNOSIS — R531 Weakness: Secondary | ICD-10-CM | POA: Diagnosis not present

## 2019-12-22 DIAGNOSIS — I2510 Atherosclerotic heart disease of native coronary artery without angina pectoris: Secondary | ICD-10-CM | POA: Diagnosis not present

## 2019-12-22 DIAGNOSIS — I350 Nonrheumatic aortic (valve) stenosis: Secondary | ICD-10-CM

## 2019-12-22 DIAGNOSIS — E049 Nontoxic goiter, unspecified: Secondary | ICD-10-CM

## 2019-12-22 DIAGNOSIS — N179 Acute kidney failure, unspecified: Secondary | ICD-10-CM | POA: Diagnosis not present

## 2019-12-22 DIAGNOSIS — E039 Hypothyroidism, unspecified: Secondary | ICD-10-CM

## 2019-12-22 DIAGNOSIS — I10 Essential (primary) hypertension: Secondary | ICD-10-CM

## 2019-12-22 DIAGNOSIS — N1832 Chronic kidney disease, stage 3b: Secondary | ICD-10-CM

## 2019-12-22 LAB — BASIC METABOLIC PANEL
Anion gap: 9 (ref 5–15)
BUN: 35 mg/dL — ABNORMAL HIGH (ref 8–23)
CO2: 22 mmol/L (ref 22–32)
Calcium: 10.6 mg/dL — ABNORMAL HIGH (ref 8.9–10.3)
Chloride: 112 mmol/L — ABNORMAL HIGH (ref 98–111)
Creatinine, Ser: 1.6 mg/dL — ABNORMAL HIGH (ref 0.44–1.00)
GFR calc Af Amer: 32 mL/min — ABNORMAL LOW (ref >60)
GFR calc non Af Amer: 27 mL/min — ABNORMAL LOW (ref >60)
Glucose, Bld: 79 mg/dL (ref 70–99)
Potassium: 4 mmol/L (ref 3.5–5.1)
Sodium: 143 mmol/L (ref 135–145)

## 2019-12-22 LAB — CBC WITH DIFFERENTIAL/PLATELET
Abs Immature Granulocytes: 0 10*3/uL (ref 0.00–0.07)
Basophils Absolute: 0 10*3/uL (ref 0.0–0.1)
Basophils Relative: 0 %
Eosinophils Absolute: 0.2 10*3/uL (ref 0.0–0.5)
Eosinophils Relative: 2 %
HCT: 33.3 % — ABNORMAL LOW (ref 36.0–46.0)
Hemoglobin: 10.1 g/dL — ABNORMAL LOW (ref 12.0–15.0)
Lymphocytes Relative: 2 %
Lymphs Abs: 0.2 10*3/uL — ABNORMAL LOW (ref 0.7–4.0)
MCH: 28.5 pg (ref 26.0–34.0)
MCHC: 30.3 g/dL (ref 30.0–36.0)
MCV: 93.8 fL (ref 80.0–100.0)
Monocytes Absolute: 0 10*3/uL — ABNORMAL LOW (ref 0.1–1.0)
Monocytes Relative: 0 %
Neutro Abs: 8.4 10*3/uL — ABNORMAL HIGH (ref 1.7–7.7)
Neutrophils Relative %: 96 %
Platelets: 184 10*3/uL (ref 150–400)
RBC: 3.55 MIL/uL — ABNORMAL LOW (ref 3.87–5.11)
RDW: 14.7 % (ref 11.5–15.5)
WBC: 8.7 10*3/uL (ref 4.0–10.5)
nRBC: 0 % (ref 0.0–0.2)
nRBC: 0 /100 WBC

## 2019-12-22 LAB — PTH-RELATED PEPTIDE: PTH-related peptide: <2 pmol/L

## 2019-12-22 NOTE — TOC Progression Note (Signed)
Transition of Care [facility]) - Progression Note    Patient Details  Name: [name] MRN: [id number] Date of Birth: [birth year]  Transition of Care [facility]) CM/SW [place], [place] Phone Number: 12/22/2019, 5:13 PM  Clinical Narrative:     Received SNF  auth # [id number] from 07/28-07/30.   Thurmond Butts, MSW, [place] Clinical Social Worker    Expected Discharge Plan: Skilled Nursing Facility Barriers to Discharge: SNF Pending bed offer, Insurance Authorization  Expected Discharge Plan and Services Expected Discharge Plan: [place] In-house Referral: Clinical Social Work     Living arrangements for the past 2 months: Single Family Home                                       Social Determinants of Health (SDOH) Interventions    Readmission Risk Interventions No flowsheet data found.

## 2019-12-22 NOTE — Progress Notes (Addendum)
PROGRESS NOTE  [name] DJS:[id number] DOB: 11/08/1923 DOA: 12/15/2019 PCP: [name], MD   LOS: 7 days   Brief narrative: As per HPI,  [name] a 86 y.o.femalewith medical history significant ofmoderate aortic stenosis, CAD s/p PCI,persistent A. Fib, HTN, HLD, hypothyroidism, low-grade lymphoma, chronic diastolic CHF presented to the emergency department with generalized weakness for 1 week. Patient's son reported that the patient has been extremely weak since 1 week, unable to walk and fell at home however no report of head trauma, seizure or loss of consciousness.Due to fall, family called patient's cardiology who recommended to hold Eliquis for at least 3 days. The only change in her medicine recently is that her PCP increased her levothyroxine dose due to elevated TSH about 2 weeks ago. Pt lives alone at home however she has caregiver and son who takes care of her. In the ED, patient's BP was noted to be on lower side. CMP shows worsening kidney function. Calcium of 14.0, troponin 28trended down to 27, lactic acid: WNL, UA negative for infection, blood culture pending. CT head shows several scalp soft tissue masses, no infarction or hemorrhage. Chest x-ray shows multiple opacities. [facility] consulted for admission for further evaluation and management of hypercalcemia and generalized weakness.  Assessment/Plan:  Principal Problem:   Generalized weakness Active Problems:   Hyperlipidemia   Hypothyroidism, goitrous   Coronary artery disease   Aortic valvular stenosis, moderate to severe   Essential hypertension   Acute on chronic kidney failure ([place])   Hypercalcemia   Palliative care by specialist   Goals of care, counseling/discussion   DNR (do not resuscitate)   Metastatic malignant neoplasm ([employer])  Hypercalcemia Unknown etiology. Likely malignancy vs multiple myeloma. PTH low, parathyroid related protein pending, vitamin D normal, SPEP with  some M spike, UPEP pending. Continue to hold calcium and vitamin D supplements. Received IV fluids. After discussion with oncology by the previous provider, patient received IV Zometa 4 mg once, improving calcium levels.  Check BMP daily.  AKI on CKD stage IIIb Creatinine around 1.6, on admission 2.34. continue to monior daily BMP.  Hypotension Improved with IV fluids.  On metoprolol.  Other antihypertensives on hold.  Generalized weakness/fall/debility. Likely multifactorial from acute kidney injury, hypercalcemia, hypertension.  Physical therapy has seen the patient and recommend skilled nursing facility placement.    Pulmonary nodules likely lung malignancy Multiple soft tissue mass/nodules on head. CT chest showed numerous bilateral pulmonary nodules suspicious for metastatic disease. CT abdomen/pelvis without contrast showed pathologic retroperitoneal, bilateral iliac and bilateral inguinal adenopathy consistent with metastatic disease. Discussed with oncology Dr. [name] on 12/17/2019, recommend noninvasive investigations to determine the primary malignancy. LDH elevated, flow cytometry pending (Dr [name] will follow up result and inform family). Previous provider had discussed with son, no further heroic measures  Hypothyroidism Synthroid dose was recently increased by PCP. TSH low 0.189, free T4 high 1.36 Continue synthroid 50 mcg daily   Chronic diastolic HF/aortic valve stenosis Bumex on hold for now due to AKI and dehydration  Persistent A. Fib with RVR Heart rate controlled. Continue metoprolol 50 mg twice daily. Restart Eliquis. Continue telemetry  Dysphagia SLP on board.  Recommend dysphagia 2 diet.  Goals of care discussion Patient with very poor prognosis. Palliative care consulted. Previous provider had discussed extensively with patient's son on 12/19/2019 about overall prognosis, requesting patient be discharged to SNF with possible outpatient palliative/hospice  support if patient continues to decline.  DVT prophylaxis: apixaban (ELIQUIS) tablet 2.5  mg Start: 12/17/19 2000 SCDs Start: 12/15/19 1327 apixaban (ELIQUIS) tablet 2.5 mg    Code Status:  DNR  Family Communication: None today  Status is: Inpatient  Remains inpatient appropriate because:Inpatient level of care appropriate due to severity of illness, will need a skilled nursing facility placement   Dispo: The patient is from: Home              Anticipated d/c is to: SNF              Anticipated d/c date is: 2 days              Patient currently is not medically stable to d/c.   Consultants:  Oncology  Procedures:  None  Antibiotics:  . None  Subjective:  Today, patient was seen and examined at bedside.  Nursing staff at bedside reported that she was having some cough while eating.  Patient denies any chest pain, shortness of breath, dyspnea.  Objective: Vitals:   12/21/19 2300 12/22/19 0400  BP: (!) 114/53 (!) 140/54  Pulse: 64 66  Resp: (!) 28 23  Temp: 98.3 F (36.8 C) 98.5 F (36.9 C)  SpO2: 97% 96%    Intake/Output Summary (Last 24 hours) at 12/22/2019 0705 Last data filed at 12/22/2019 0520 Gross per 24 hour  Intake 360 ml  Output 650 ml  Net -290 ml   Filed Weights   12/20/19 0338 12/21/19 0500 12/22/19 0400  Weight: 57 kg 59.3 kg 58.1 kg   Body mass index is 22.69 kg/m.   Physical Exam:  GENERAL: Patient is alert awake and communicative.  Coughing.  Frail, thinly built.  Not in obvious distress. HENT: No scleral pallor or icterus. Pupils equally reactive to light. Oral mucosa is moist. Scalp nodules. NECK: is supple, no gross swelling noted. CHEST:  Diminished breath sounds bilaterally. CVS: S1 and S2 heard, no murmur. Regular rate and rhythm.  ABDOMEN: Soft, non-tender, bowel sounds are present. EXTREMITIES: No edema. CNS: Cranial nerves are intact.  Moving all extremities. SKIN: warm and dry without rashes.  Data Review: I have  personally reviewed the following laboratory data and studies,  CBC: Recent Labs  Lab 12/18/19 0249 12/19/19 0335 12/20/19 0308 12/21/19 0234 12/22/19 0414  WBC 6.1 7.2 8.2 7.5 8.7  NEUTROABS 4.1 4.4 5.7 6.3 8.4*  HGB 9.7* 9.9* 9.5* 9.6* 10.1*  HCT 32.1* 31.9* 31.4* 31.6* 33.3*  MCV 93.3 94.1 94.0 95.2 93.8  PLT 171 182 175 181 902   Basic Metabolic Panel: Recent Labs  Lab 12/15/19 1422 12/16/19 0229 12/18/19 0249 12/19/19 0335 12/20/19 0308 12/21/19 0234 12/22/19 0414  NA  --    < > 145 147* 146* 145 143  K  --    < > 3.7 3.4* 3.6 3.7 4.0  CL  --    < > 112* 114* 115* 113* 112*  CO2  --    < > '24 25 24 25 22  '$ GLUCOSE  --    < > 88 132* 101* 107* 79  BUN  --    < > 44* 45* 41* 38* 35*  CREATININE 2.35*   < > 2.15* 1.91* 1.68* 1.54* 1.60*  CALCIUM 13.8*   < > 11.8* 12.2* 11.3* 11.2* 10.6*  MG 2.6*  --   --   --   --   --   --   PHOS 3.9  --   --   --   --   --   --    < > =  values in this interval not displayed.   Liver Function Tests: Recent Labs  Lab 12/17/19 0257 12/18/19 0249 12/19/19 0335 12/20/19 0308 12/21/19 0234  AST '25 26 25 28 '$ 35  ALT '17 17 17 21 26  '$ ALKPHOS 82 79 81 72 76  BILITOT 1.3* 1.4* 1.0 1.1 1.2  PROT 5.5* 5.2* 5.3* 4.9* 4.8*  ALBUMIN 2.6* 2.5* 2.5* 2.3* 2.3*   No results for input(s): LIPASE, AMYLASE in the last 168 hours. No results for input(s): AMMONIA in the last 168 hours. Cardiac Enzymes: No results for input(s): CKTOTAL, CKMB, CKMBINDEX, TROPONINI in the last 168 hours. BNP (last 3 results) Recent Labs    05/28/19 1345 05/30/19 1037  BNP 387.5* 345.4*    ProBNP (last 3 results) No results for input(s): PROBNP in the last 8760 hours.  CBG: No results for input(s): GLUCAP in the last 168 hours. Recent Results (from the past 240 hour(s))  Culture, blood (routine x 2)     Status: None   Collection Time: 12/15/19  2:22 PM   Specimen: BLOOD  Result Value Ref Range Status   Specimen Description BLOOD BLOOD LEFT FOREARM   Final   Special Requests   Final    BOTTLES DRAWN AEROBIC AND ANAEROBIC Blood Culture adequate volume   Culture   Final    NO GROWTH 5 DAYS Performed at [facility], [address] [place], [place] [address]    Report Status 12/20/2019 FINAL  Final  Culture, blood (routine x 2)     Status: None   Collection Time: 12/15/19  2:26 PM   Specimen: BLOOD  Result Value Ref Range Status   Specimen Description BLOOD RIGHT ANTECUBITAL  Final   Special Requests   Final    BOTTLES DRAWN AEROBIC AND ANAEROBIC Blood Culture results may not be optimal due to an inadequate volume of blood received in culture bottles   Culture   Final    NO GROWTH 5 DAYS Performed at [facility], [place] [address], [place], [place] [address]    Report Status 12/20/2019 FINAL  Final  SARS Coronavirus 2 by RT PCR (hospital order, performed in [facility] hospital lab) Nasopharyngeal Nasopharyngeal Swab     Status: None   Collection Time: 12/15/19  2:46 PM   Specimen: Nasopharyngeal Swab  Result Value Ref Range Status   SARS Coronavirus 2 NEGATIVE NEGATIVE Final    Comment: (NOTE) SARS-CoV-2 target nucleic acids are NOT DETECTED.  The SARS-CoV-2 RNA is generally detectable in upper and lower respiratory specimens during the acute phase of infection. The lowest concentration of SARS-CoV-2 viral copies this assay can detect is 250 copies / mL. A negative result does not preclude SARS-CoV-2 infection and should not be used as the sole basis for treatment or other patient management decisions.  A negative result may occur with improper specimen collection / handling, submission of specimen other than nasopharyngeal swab, presence of viral mutation(s) within the areas targeted by this assay, and inadequate number of viral copies (<250 copies / mL). A negative result must be combined with clinical observations, patient history, and epidemiological information.  Fact Sheet for Patients:     StrictlyIdeas.no  Fact Sheet for Healthcare Providers: BankingDealers.co.za  This test is not yet approved or  cleared by the [place] FDA and has been authorized for detection and/or diagnosis of SARS-CoV-2 by FDA under an Emergency Use Authorization (EUA).  This EUA will remain in effect (meaning this test can be used) for  the duration of the COVID-19 declaration under Section 564(b)(1) of the Act, 21 U.S.C. section 360bbb-3(b)(1), unless the authorization is terminated or revoked sooner.  Performed at [facility], [place] [address], [place], [place] [address]   SARS CORONAVIRUS 2 (TAT 6-24 HRS) Nasopharyngeal Nasopharyngeal Swab     Status: None   Collection Time: 12/20/19  3:35 PM   Specimen: Nasopharyngeal Swab  Result Value Ref Range Status   SARS Coronavirus 2 NEGATIVE NEGATIVE Final    Comment: (NOTE) SARS-CoV-2 target nucleic acids are NOT DETECTED.  The SARS-CoV-2 RNA is generally detectable in upper and lower respiratory specimens during the acute phase of infection. Negative results do not preclude SARS-CoV-2 infection, do not rule out co-infections with other pathogens, and should not be used as the sole basis for treatment or other patient management decisions. Negative results must be combined with clinical observations, patient history, and epidemiological information. The expected result is Negative.  Fact Sheet for Patients: SugarRoll.be  Fact Sheet for Healthcare Providers: https://www.woods-mathews.com/  This test is not yet approved or cleared by the [place] FDA and  has been authorized for detection and/or diagnosis of SARS-CoV-2 by FDA under an Emergency Use Authorization (EUA). This EUA will remain  in effect (meaning this test can be used) for the duration of the COVID-19 declaration under Se ction 564(b)(1) of the Act, 21 U.S.C. section  360bbb-3(b)(1), unless the authorization is terminated or revoked sooner.  Performed at [facility], [place] [address] [place], [place] [address]      Studies: DG Swallowing Func-Speech Pathology  Result Date: 12/20/2019 Objective Swallowing Evaluation: Type of Study: MBS-Modified Barium Swallow Study  Patient Details Name: ANIKKA MARSAN MRN: [id number] Date of Birth: 02-Dec-1923 Today's Date: 12/20/2019 Time: SLP Start Time (ACUTE ONLY): 1045 -SLP Stop Time (ACUTE ONLY): 1102 SLP Time Calculation (min) (ACUTE ONLY): 17 min Past Medical History: Past Medical History: Diagnosis Date .  Acute respiratiory failure requiring BiPap 06/24/2011 . Allergy  . Aortic valvular stenosis, moderate to severe 06/24/2011  a. 10/2012 Echo: mod AS, Valve 1.25 cm^2 (VTI), 1.19cm^2 (Vmax). . Arthritis   "back" (10/30/2012) . Chronic diastolic CHF (congestive heart failure) ([place])   a. 10/2012 Echo: EF 55-60%, no rwma, Gr 2 DD, moderate AS, mod dil LA, mildly to mod dil RA. Marland Kitchen Coronary artery disease   a. 03/2009 PCI RCA (3.0x18 BMS). . Dyslipidemia  . Exertional shortness of breath  . Hypertension  . Hypothyroidism  . PAF (paroxysmal atrial fibrillation) ([place])   a. CHA2DS2VASc = 7-->chronic coumadin;  b. s/p DCCV 05/2011, 05/2012, 07/2014;  c. On amio. . Rash   a. felt to be 2/2 lasix/torsemide in setting of sulfa allergy (lasix d/c ~ 06/2014, torsemide d/c 08/09/2014). Past Surgical History: Past Surgical History: Procedure Laterality Date . ANGIOPLASTY   . APPENDECTOMY   . BACK SURGERY   . CARDIAC CATHETERIZATION  10/06/2009  Continue medical therapy . CARDIAC CATHETERIZATION  04/07/2009  RCA stented with a 3x51m stent resulting in a reduction of 90% narrowing to normal . CARDIOVERSION  06/19/2011  Procedure: CARDIOVERSION;  Surgeon: MSanda Klein MD;  Location: [facility];  Service: Cardiovascular;  Laterality: N/A; . CARDIOVERSION  07/11/2010  Successful coversion to sinus rhythm . CARDIOVERSION N/A 08/10/2014  Procedure: CARDIOVERSION;   Surgeon: MSanda Klein MD;  Location: [facility];  Service: Cardiovascular;  Laterality: N/A; . CATARACT EXTRACTION W/ INTRAOCULAR LENS  IMPLANT, BILATERAL   . CORONARY ANGIOPLASTY WITH STENT PLACEMENT    "1" (10/30/2012) .  DILATION AND CURETTAGE OF UTERUS   . EXCISIONAL HEMORRHOIDECTOMY   . FRACTURE SURGERY   . HIP ARTHROPLASTY Right 04/17/2015  Procedure: HIP MONOPOLAR HIP HEMI ARTHROPLASTY;  Surgeon: Marybelle Killings, MD;  Location: [place] [facility];  Service: Orthopedics;  Laterality: Right; . IR THORACENTESIS ASP PLEURAL SPACE W/IMG GUIDE  04/28/2018 . IR THORACENTESIS ASP PLEURAL SPACE W/IMG GUIDE  05/26/2018 . LUMBAR DISC SURGERY  X 2  "cleaned out scar tissue"  . SHOULDER ACROMIOPLASTY Left   "fell; put a new ball in" (10/30/2012) . WRIST FRACTURE SURGERY Left  HPI:  86 y.o. female with medical history significant of moderate aortic stenosis, CAD s/p PCI, persistent A. Fib, HTN, HLD, hypothyroidism, low-grade lymphoma, chronic diastolic CHF presents to emergency department with generalized weakness since 1 week. Patient's son reports patient has been extremely weak since 1 week, unable to walk and fell last week however denies head trauma, seizure or loss of consciousness. Reports that since patient started on Eliquis couple of months ago due to A. fib she has multiple knots on her head. CT head shows several scalp soft tissue masses, no infarction or hemorrhage.  CT chest shows multile nodules and adenopathy on CT abdomen suggestive of metastatic disease. BSE completed on 12/19/19 with indication for MBS with diet recommendation of Dysphagia 1/nectar-thickened liquids.  No data recorded Assessment / Plan / Recommendation CHL IP CLINICAL IMPRESSIONS 12/20/2019 Clinical Impression Pt demonstrated oropharyngeal dysphagia characterized by decreased bolus preparation/propulsion and min oral holding prior to swallow iniatiation; delayed iniation of the swallow (could be contributed to presbyphagia) with initiation at valleculae  due to decreased BOT retraction/pharyngeal weakness; deep penetration and eventual aspiration noted with thin via larger cup sips/straw with smaller sips/tsp amounts not aspirated during MBS, but pt is at risk with meals d/t vallecular residue present during study.  Nectar-thickened liquids did not enter airway during study despite volume.  Pt had mild-mod vallecular residue cleared to min to insignificant amounts with repetitive, effortful swallows during study.  Recommend upgrading diet to Dysphagia 2/nectar-thickened liquids with ST allowing thin via tsp/small sips during ST tx only until pt improves pharyngeal strength via use of dysphagia tx.  Without aspiration precautions/swallowing strategies, pt is at mod risk for aspiration during meals. SLP Visit Diagnosis Dysphagia, oropharyngeal phase (R13.12) Attention and concentration deficit following -- Frontal lobe and executive function deficit following -- Impact on safety and function Mild aspiration risk;Moderate aspiration risk   CHL IP TREATMENT RECOMMENDATION 12/20/2019 Treatment Recommendations Therapy as outlined in treatment plan below   Prognosis 12/20/2019 Prognosis for Safe Diet Advancement Good Barriers to Reach Goals -- Barriers/Prognosis Comment -- CHL IP DIET RECOMMENDATION 12/20/2019 SLP Diet Recommendations Dysphagia 2 (Fine chop) solids;Nectar thick liquid;Other (Comment) Liquid Administration via Cup;Straw;Other (Comment) Medication Administration Whole meds with puree Compensations Slow rate;Small sips/bites;Effortful swallow;Clear throat intermittently;Multiple dry swallows after each bite/sip Postural Changes --   CHL IP OTHER RECOMMENDATIONS 12/20/2019 Recommended Consults -- Oral Care Recommendations Oral care BID Other Recommendations --   CHL IP FOLLOW UP RECOMMENDATIONS 12/20/2019 Follow up Recommendations 24 hour supervision/assistance   CHL IP FREQUENCY AND DURATION 12/20/2019 Speech Therapy Frequency (ACUTE ONLY) min 2x/week Treatment  Duration 2 weeks      CHL IP ORAL PHASE 12/20/2019 Oral Phase Impaired Oral - Pudding Teaspoon -- Oral - Pudding Cup -- Oral - Honey Teaspoon -- Oral - Honey Cup -- Oral - Nectar Teaspoon Premature spillage Oral - Nectar Cup Premature spillage Oral - Nectar Straw -- Oral - Thin Teaspoon Premature spillage  Oral - Thin Cup Premature spillage Oral - Thin Straw Premature spillage Oral - Puree -- Oral - Mech Soft Delayed oral transit;Other (Comment) Oral - Regular -- Oral - Multi-Consistency -- Oral - Pill -- Oral Phase - Comment (No Data)  CHL IP PHARYNGEAL PHASE 12/20/2019 Pharyngeal Phase Impaired Pharyngeal- Pudding Teaspoon -- Pharyngeal -- Pharyngeal- Pudding Cup -- Pharyngeal -- Pharyngeal- Honey Teaspoon -- Pharyngeal -- Pharyngeal- Honey Cup -- Pharyngeal -- Pharyngeal- Nectar Teaspoon -- Pharyngeal -- Pharyngeal- Nectar Cup Pharyngeal residue - valleculae;Compensatory strategies attempted (with notebox);Delayed swallow initiation-vallecula;Reduced tongue base retraction Pharyngeal -- Pharyngeal- Nectar Straw Reduced tongue base retraction;Pharyngeal residue - valleculae;Compensatory strategies attempted (with notebox) Pharyngeal -- Pharyngeal- Thin Teaspoon Reduced tongue base retraction;Pharyngeal residue - valleculae Pharyngeal -- Pharyngeal- Thin Cup Reduced tongue base retraction;Reduced airway/laryngeal closure;Penetration/Aspiration during swallow;Moderate aspiration;Pharyngeal residue - valleculae;Pharyngeal residue - pyriform;Compensatory strategies attempted (with notebox) Pharyngeal Material enters airway, remains ABOVE vocal cords and not ejected out Pharyngeal- Thin Straw -- Pharyngeal -- Pharyngeal- Puree Reduced tongue base retraction;Pharyngeal residue - valleculae;Compensatory strategies attempted (with notebox) Pharyngeal -- Pharyngeal- Mechanical Soft Pharyngeal residue - valleculae;Delayed swallow initiation-vallecula;Reduced tongue base retraction Pharyngeal -- Pharyngeal- Regular --  Pharyngeal -- Pharyngeal- Multi-consistency -- Pharyngeal -- Pharyngeal- Pill -- Pharyngeal -- Pharyngeal Comment --  CHL IP CERVICAL ESOPHAGEAL PHASE 12/20/2019 Cervical Esophageal Phase WFL Pudding Teaspoon -- Pudding Cup -- Honey Teaspoon -- Honey Cup -- Nectar Teaspoon -- Nectar Cup -- Nectar Straw -- Thin Teaspoon -- Thin Cup -- Thin Straw -- Puree -- Mechanical Soft -- Regular -- Multi-consistency -- Pill -- Cervical Esophageal Comment -- Elvina Sidle, M.S., CCC-SLP 12/20/2019, 3:41 PM                Flora Lipps, MD  [facility] 12/22/2019

## 2019-12-22 NOTE — Progress Notes (Signed)
  Speech Language Pathology Treatment: Dysphagia  Patient Details Name: [name] MRN: [id number] DOB: 1923-07-29 Today's Date: 12/22/2019 Time: 6770-3403 SLP Time Calculation (min) (ACUTE ONLY): 13 min  Assessment / Plan / Recommendation Clinical Impression  Pt was seen for dysphagia treatment. Nursing reported that the pt demonstrated coughing during breakfast. Pt indicated that she has been having "coughing spells" and that this occurred when she was eating eggs this morning. She tolerated dysphagia 2 solids and nectar thick liquids without overt s/sx of aspiration. A delayed cough was noted with 1/4 boluses of thin liquids via cup, suggesting possible aspiration. Mastication was prolonged but functional. Effortful swallows were completed, but pt exhibited difficulty identifying her perceived level of effort. It is recommended that the pt's diet be continued with observance of swallow precautions. SLP will continue to follow pt.    HPI HPI:  86 y.o. female with medical history significant of moderate aortic stenosis, CAD s/p PCI, persistent A. Fib, HTN, HLD, hypothyroidism, low-grade lymphoma, chronic diastolic CHF presents to emergency department with generalized weakness since 1 week. Patient's son reports patient has been extremely weak since 1 week, unable to walk and fell last week however denies head trauma, seizure or loss of consciousness. Reports that since patient started on Eliquis couple of months ago due to A. fib she has multiple knots on her head. CT head shows several scalp soft tissue masses, no infarction or hemorrhage.  CT chest shows multile nodules and adenopathy on CT abdomen suggestive of metastatic disease. Pt observed to "choke" on eggs.        SLP Plan  Continue with current plan of care       Recommendations  Diet recommendations: Dysphagia 2 (fine chop);Nectar-thick liquid Liquids provided via: Cup;No straw Medication Administration: Crushed with  puree Supervision: Staff to assist with self feeding Compensations: Slow rate;Small sips/bites;Effortful swallow;Clear throat intermittently;Multiple dry swallows after each bite/sip                Follow up Recommendations: 24 hour supervision/assistance SLP Visit Diagnosis: Dysphagia, oropharyngeal phase (R13.12) Plan: Continue with current plan of care       Charmika Macdonnell I. Hardin Negus, [place], [place] Office number [phone] Pager [place] 12/22/2019, 1:45 PM

## 2019-12-23 DIAGNOSIS — N189 Chronic kidney disease, unspecified: Secondary | ICD-10-CM | POA: Diagnosis not present

## 2019-12-23 DIAGNOSIS — I350 Nonrheumatic aortic (valve) stenosis: Secondary | ICD-10-CM | POA: Diagnosis not present

## 2019-12-23 DIAGNOSIS — Z882 Allergy status to sulfonamides status: Secondary | ICD-10-CM | POA: Diagnosis not present

## 2019-12-23 DIAGNOSIS — E039 Hypothyroidism, unspecified: Secondary | ICD-10-CM | POA: Diagnosis not present

## 2019-12-23 DIAGNOSIS — I4819 Other persistent atrial fibrillation: Secondary | ICD-10-CM | POA: Diagnosis not present

## 2019-12-23 DIAGNOSIS — D649 Anemia, unspecified: Secondary | ICD-10-CM | POA: Diagnosis not present

## 2019-12-23 DIAGNOSIS — Z888 Allergy status to other drugs, medicaments and biological substances status: Secondary | ICD-10-CM | POA: Diagnosis not present

## 2019-12-23 DIAGNOSIS — R531 Weakness: Secondary | ICD-10-CM | POA: Diagnosis not present

## 2019-12-23 DIAGNOSIS — N1832 Chronic kidney disease, stage 3b: Secondary | ICD-10-CM | POA: Diagnosis not present

## 2019-12-23 DIAGNOSIS — I2510 Atherosclerotic heart disease of native coronary artery without angina pectoris: Secondary | ICD-10-CM | POA: Diagnosis not present

## 2019-12-23 DIAGNOSIS — R410 Disorientation, unspecified: Secondary | ICD-10-CM | POA: Diagnosis not present

## 2019-12-23 DIAGNOSIS — M2550 Pain in unspecified joint: Secondary | ICD-10-CM | POA: Diagnosis not present

## 2019-12-23 DIAGNOSIS — K8020 Calculus of gallbladder without cholecystitis without obstruction: Secondary | ICD-10-CM | POA: Diagnosis not present

## 2019-12-23 DIAGNOSIS — E785 Hyperlipidemia, unspecified: Secondary | ICD-10-CM | POA: Diagnosis not present

## 2019-12-23 DIAGNOSIS — C7951 Secondary malignant neoplasm of bone: Secondary | ICD-10-CM | POA: Diagnosis not present

## 2019-12-23 DIAGNOSIS — Z96642 Presence of left artificial hip joint: Secondary | ICD-10-CM | POA: Diagnosis not present

## 2019-12-23 DIAGNOSIS — R918 Other nonspecific abnormal finding of lung field: Secondary | ICD-10-CM | POA: Diagnosis not present

## 2019-12-23 DIAGNOSIS — J189 Pneumonia, unspecified organism: Secondary | ICD-10-CM | POA: Diagnosis not present

## 2019-12-23 DIAGNOSIS — R7989 Other specified abnormal findings of blood chemistry: Secondary | ICD-10-CM | POA: Diagnosis not present

## 2019-12-23 DIAGNOSIS — C865 Angioimmunoblastic T-cell lymphoma: Secondary | ICD-10-CM | POA: Diagnosis not present

## 2019-12-23 DIAGNOSIS — Z9181 History of falling: Secondary | ICD-10-CM | POA: Diagnosis not present

## 2019-12-23 DIAGNOSIS — I959 Hypotension, unspecified: Secondary | ICD-10-CM | POA: Diagnosis not present

## 2019-12-23 DIAGNOSIS — Z955 Presence of coronary angioplasty implant and graft: Secondary | ICD-10-CM | POA: Diagnosis not present

## 2019-12-23 DIAGNOSIS — Z515 Encounter for palliative care: Secondary | ICD-10-CM | POA: Diagnosis not present

## 2019-12-23 DIAGNOSIS — R52 Pain, unspecified: Secondary | ICD-10-CM | POA: Diagnosis not present

## 2019-12-23 DIAGNOSIS — R1310 Dysphagia, unspecified: Secondary | ICD-10-CM | POA: Diagnosis not present

## 2019-12-23 DIAGNOSIS — R0689 Other abnormalities of breathing: Secondary | ICD-10-CM | POA: Diagnosis not present

## 2019-12-23 DIAGNOSIS — C8590 Non-Hodgkin lymphoma, unspecified, unspecified site: Secondary | ICD-10-CM | POA: Diagnosis not present

## 2019-12-23 DIAGNOSIS — R404 Transient alteration of awareness: Secondary | ICD-10-CM | POA: Diagnosis not present

## 2019-12-23 DIAGNOSIS — Z7401 Bed confinement status: Secondary | ICD-10-CM | POA: Diagnosis not present

## 2019-12-23 DIAGNOSIS — G479 Sleep disorder, unspecified: Secondary | ICD-10-CM | POA: Diagnosis not present

## 2019-12-23 DIAGNOSIS — J168 Pneumonia due to other specified infectious organisms: Secondary | ICD-10-CM | POA: Diagnosis not present

## 2019-12-23 DIAGNOSIS — Z743 Need for continuous supervision: Secondary | ICD-10-CM | POA: Diagnosis not present

## 2019-12-23 DIAGNOSIS — R5381 Other malaise: Secondary | ICD-10-CM | POA: Diagnosis not present

## 2019-12-23 DIAGNOSIS — I499 Cardiac arrhythmia, unspecified: Secondary | ICD-10-CM | POA: Diagnosis not present

## 2019-12-23 DIAGNOSIS — R7981 Abnormal blood-gas level: Secondary | ICD-10-CM | POA: Diagnosis not present

## 2019-12-23 DIAGNOSIS — I130 Hypertensive heart and chronic kidney disease with heart failure and stage 1 through stage 4 chronic kidney disease, or unspecified chronic kidney disease: Secondary | ICD-10-CM | POA: Diagnosis not present

## 2019-12-23 DIAGNOSIS — R911 Solitary pulmonary nodule: Secondary | ICD-10-CM | POA: Diagnosis not present

## 2019-12-23 DIAGNOSIS — C799 Secondary malignant neoplasm of unspecified site: Secondary | ICD-10-CM | POA: Diagnosis not present

## 2019-12-23 DIAGNOSIS — N179 Acute kidney failure, unspecified: Secondary | ICD-10-CM | POA: Diagnosis not present

## 2019-12-23 DIAGNOSIS — M1990 Unspecified osteoarthritis, unspecified site: Secondary | ICD-10-CM | POA: Diagnosis not present

## 2019-12-23 DIAGNOSIS — I5032 Chronic diastolic (congestive) heart failure: Secondary | ICD-10-CM | POA: Diagnosis not present

## 2019-12-23 DIAGNOSIS — Z79899 Other long term (current) drug therapy: Secondary | ICD-10-CM | POA: Diagnosis not present

## 2019-12-23 DIAGNOSIS — Z66 Do not resuscitate: Secondary | ICD-10-CM | POA: Diagnosis not present

## 2019-12-23 DIAGNOSIS — R000 Tachycardia, unspecified: Secondary | ICD-10-CM | POA: Diagnosis not present

## 2019-12-23 DIAGNOSIS — Z96641 Presence of right artificial hip joint: Secondary | ICD-10-CM | POA: Diagnosis not present

## 2019-12-23 DIAGNOSIS — Z7901 Long term (current) use of anticoagulants: Secondary | ICD-10-CM | POA: Diagnosis not present

## 2019-12-23 DIAGNOSIS — R6889 Other general symptoms and signs: Secondary | ICD-10-CM | POA: Diagnosis not present

## 2019-12-23 DIAGNOSIS — R160 Hepatomegaly, not elsewhere classified: Secondary | ICD-10-CM | POA: Diagnosis not present

## 2019-12-23 DIAGNOSIS — J90 Pleural effusion, not elsewhere classified: Secondary | ICD-10-CM | POA: Diagnosis not present

## 2019-12-23 DIAGNOSIS — R600 Localized edema: Secondary | ICD-10-CM | POA: Diagnosis not present

## 2019-12-23 DIAGNOSIS — Z91048 Other nonmedicinal substance allergy status: Secondary | ICD-10-CM | POA: Diagnosis not present

## 2019-12-23 LAB — CBC WITH DIFFERENTIAL/PLATELET
Abs Immature Granulocytes: 0.06 K/uL (ref 0.00–0.07)
Basophils Absolute: 0 K/uL (ref 0.0–0.1)
Basophils Relative: 0 %
Eosinophils Absolute: 0.4 K/uL (ref 0.0–0.5)
Eosinophils Relative: 5 %
HCT: 34.6 % — ABNORMAL LOW (ref 36.0–46.0)
Hemoglobin: 10.7 g/dL — ABNORMAL LOW (ref 12.0–15.0)
Immature Granulocytes: 1 %
Lymphocytes Relative: 17 %
Lymphs Abs: 1.5 K/uL (ref 0.7–4.0)
MCH: 29 pg (ref 26.0–34.0)
MCHC: 30.9 g/dL (ref 30.0–36.0)
MCV: 93.8 fL (ref 80.0–100.0)
Monocytes Absolute: 0.8 K/uL (ref 0.1–1.0)
Monocytes Relative: 9 %
Neutro Abs: 6.2 K/uL (ref 1.7–7.7)
Neutrophils Relative %: 68 %
Platelets: 211 K/uL (ref 150–400)
RBC: 3.69 MIL/uL — ABNORMAL LOW (ref 3.87–5.11)
RDW: 14.8 % (ref 11.5–15.5)
WBC: 9 K/uL (ref 4.0–10.5)
nRBC: 0 % (ref 0.0–0.2)

## 2019-12-23 LAB — COMPREHENSIVE METABOLIC PANEL WITH GFR
ALT: 25 U/L (ref 0–44)
AST: 31 U/L (ref 15–41)
Albumin: 2.4 g/dL — ABNORMAL LOW (ref 3.5–5.0)
Alkaline Phosphatase: 105 U/L (ref 38–126)
Anion gap: 10 (ref 5–15)
BUN: 38 mg/dL — ABNORMAL HIGH (ref 8–23)
CO2: 23 mmol/L (ref 22–32)
Calcium: 10.7 mg/dL — ABNORMAL HIGH (ref 8.9–10.3)
Chloride: 108 mmol/L (ref 98–111)
Creatinine, Ser: 1.89 mg/dL — ABNORMAL HIGH (ref 0.44–1.00)
GFR calc Af Amer: 26 mL/min — ABNORMAL LOW
GFR calc non Af Amer: 22 mL/min — ABNORMAL LOW
Glucose, Bld: 114 mg/dL — ABNORMAL HIGH (ref 70–99)
Potassium: 3.7 mmol/L (ref 3.5–5.1)
Sodium: 141 mmol/L (ref 135–145)
Total Bilirubin: 1.8 mg/dL — ABNORMAL HIGH (ref 0.3–1.2)
Total Protein: 5.2 g/dL — ABNORMAL LOW (ref 6.5–8.1)

## 2019-12-23 LAB — MAGNESIUM: Magnesium: 2.2 mg/dL (ref 1.7–2.4)

## 2019-12-23 LAB — PHOSPHORUS: Phosphorus: 3.5 mg/dL (ref 2.5–4.6)

## 2019-12-23 MED ORDER — LEVOTHYROXINE SODIUM 50 MCG PO TABS: 50.0000 ug | ORAL_TABLET | Freq: Every day | ORAL | Status: AC

## 2019-12-23 MED ORDER — APIXABAN 2.5 MG PO TABS: 2.5000 mg | ORAL_TABLET | Freq: Two times a day (BID) | ORAL | Status: AC

## 2019-12-23 MED ORDER — SPIRONOLACTONE 25 MG PO TABS: 25.0000 mg | ORAL_TABLET | Freq: Every day | ORAL | Status: DC

## 2019-12-23 MED ORDER — POLYETHYLENE GLYCOL 3350 17 G PO PACK: 17.0000 g | PACK | Freq: Every day | ORAL | 0 refills | Status: AC | PRN

## 2019-12-23 MED ORDER — SENNA 8.6 MG PO TABS: 1.0000 | ORAL_TABLET | Freq: Two times a day (BID) | ORAL | 0 refills | Status: AC

## 2019-12-23 MED ORDER — BUMETANIDE 1 MG PO TABS: 1.0000 mg | ORAL_TABLET | Freq: Every day | ORAL | Status: AC | PRN

## 2019-12-23 NOTE — Progress Notes (Signed)
PT Cancellation Note  Patient Details Name: [name] MRN: [id number] DOB: 09-04-1923   Cancelled Treatment:    Reason Eval/Treat Not Completed: Other (comment) (eating breakfast, will return)   Derrel Moore B Elizah Lydon 12/23/2019, 8:10 AM Bayard Males, PT Acute Rehabilitation Services Pager: [phone] Office: [phone]

## 2019-12-23 NOTE — Progress Notes (Signed)
Physical Therapy Treatment Patient Details Name: [name] MRN: [id number] DOB: Jun 15, 1923 Today's Date: 12/23/2019    History of Present Illness 86 y.o. female presenting with generalized weakness x1 wk, dehydration and hypercalcemia. PMHx significant for aortic stenosis, CAD s/p PCI, A-fib, HTN, HLD, hypothyroidism, low grade lymphoma, and chronic diastolic CHF.     PT Comments    Pt very pleasant in chair and willing to walk but limited by fatigue this session. Pt continues to perform HEP well and reports she just doesn't feel like herself. Pt able to voice need to void and perform pericare without assist. Pt awaiting D/C today and asking for details on timing. Encouraged mobility particularly to bathroom with staff.     Follow Up Recommendations  Supervision for mobility/OOB;SNF     Equipment Recommendations  Rolling walker with 5" wheels    Recommendations for Other Services       Precautions / Restrictions Precautions Precautions: Fall    Mobility  Bed Mobility               General bed mobility comments: pt in chair on arrival  Transfers Overall transfer level: Needs assistance   Transfers: Sit to/from Stand Sit to Stand: Min guard         General transfer comment: pt able to stand from recliner and to and from toilet with rail with guarding for safety without cues for hand placement. Standing from chair needed cues not to reach out for walker in sitting  Ambulation/Gait Ambulation/Gait assistance: Min guard Gait Distance (Feet): 100 Feet Assistive device: Rolling walker (2 wheeled) Gait Pattern/deviations: Step-through pattern;Decreased stride length;Trunk flexed;Wide base of support   Gait velocity interpretation: >2.62 ft/sec, indicative of community ambulatory General Gait Details: cues for posture and proximity to RW limited by fatigue and need to void   Stairs             Wheelchair Mobility    Modified Rankin (Stroke Patients  Only)       Balance Overall balance assessment: Needs assistance   Sitting balance-Leahy Scale: Good Sitting balance - Comments: toilet without LOB   Standing balance support: Bilateral upper extremity supported Standing balance-Leahy Scale: Fair Standing balance comment: pt able to stand at sink to wash hands without support, bil UE support for gait                            Cognition Arousal/Alertness: Awake/alert Behavior During Therapy: WFL for tasks assessed/performed Overall Cognitive Status: Impaired/Different from baseline                           Safety/Judgement: Decreased awareness of safety            Exercises General Exercises - Lower Extremity Long Arc Quad: AROM;Both;Seated;20 reps Hip ABduction/ADduction: AROM;Both;Seated;20 reps Hip Flexion/Marching: AROM;Both;Seated;20 reps    General Comments        Pertinent Vitals/Pain Pain Assessment: No/denies pain    Home Living                      Prior Function            PT Goals (current goals can now be found in the care plan section) Progress towards PT goals: Progressing toward goals    Frequency    Min 2X/week      PT Plan Current plan remains appropriate    Co-evaluation  AM-PAC PT "6 Clicks" Mobility   Outcome Measure  Help needed turning from your back to your side while in a flat bed without using bedrails?: None Help needed moving from lying on your back to sitting on the side of a flat bed without using bedrails?: None Help needed moving to and from a bed to a chair (including a wheelchair)?: A Little Help needed standing up from a chair using your arms (e.g., wheelchair or bedside chair)?: A Little Help needed to walk in hospital room?: A Little Help needed climbing 3-5 steps with a railing? : A Little 6 Click Score: 20    End of Session   Activity Tolerance: Patient tolerated treatment well Patient left: in chair;with  call bell/phone within reach;with chair alarm set Nurse Communication: Mobility status PT Visit Diagnosis: Other abnormalities of gait and mobility (R26.89);Muscle weakness (generalized) (M62.81)     Time: 3403-7096 PT Time Calculation (min) (ACUTE ONLY): 18 min  Charges:  $Gait Training: 8-22 mins                     Bayard Males, PT Acute Rehabilitation Services Pager: [phone] Office: [place] 12/23/2019, 1:15 PM

## 2019-12-23 NOTE — Discharge Summary (Addendum)
Physician Discharge Summary  [name] LKG:[id number] DOB: March 25, 1924 DOA: 12/15/2019  PCP: [name], MD  Admit date: 12/15/2019 Discharge date: 12/23/2019  Admitted From: Home  Discharge disposition: SNF   Recommendations for Outpatient Follow-Up:   . Follow up with your primary care provider at the skilled nursing facility in 3 to 5 days . Check CBC, BMP, magnesium, phosphorus in the next visit.  Patient received his Zometa for hypercalcemia, will need to monitor as outpatient. . Please consider palliative care in the long-term. Blood work-up for malignancy have been sent.  Dr [name], oncology will follow up result and inform family.  No aggressive treatment has been planned.  Discharge Diagnosis:   Principal Problem:   Generalized weakness Active Problems:   Hyperlipidemia   Hypothyroidism, goitrous   Coronary artery disease   Aortic valvular stenosis, moderate to severe   Essential hypertension   Acute on chronic kidney failure ([place])   Hypercalcemia   Palliative care by specialist   Goals of care, counseling/discussion   DNR (do not resuscitate)   Metastatic malignant neoplasm [facility])    Discharge Condition: Improved.  Diet recommendation: Low sodium, heart healthy.   Dysphagia 2 (fine chop);Nectar-thick liquid Liquids provided via: Cup;No straw Medication Administration: Crushed with puree Supervision: Staff to assist with self feeding Compensations: Slow rate;Small sips/bites;Effortful swallow;Clear throat intermittently;Multiple dry swallows after each bite/sip     Wound care: None.  Code status: DNR   History of Present Illness:   [name] a 86 y.o.femalewith medical history significant ofmoderate aortic stenosis, CAD s/p PCI,persistent A. Fib, HTN, HLD, hypothyroidism, low-grade lymphoma, chronic diastolic CHF presented to the emergency department with generalized weakness for 1 week. Patient's son reported that the patient has  been extremely weak since 1 week, unable to walk and fell at home however no report of head trauma, seizure or loss of consciousness.Due to fall, family called patient's cardiology who recommended to hold Eliquis for at least 3 days. The only change in her medicine recently is that her PCP increased her levothyroxine dose due to elevated TSH about 2 weeks ago. Pt lives alone at home however she has caregiver and son who takes care of her. In the ED, patient's BP was noted to be on lower side. CMP shows worsening kidney function. Calcium of 14.0, troponin 28trended down to 27, lactic acid: WNL, UA negative for infection, blood culture pending. CT head shows several scalp soft tissue masses, no infarction or hemorrhage. Chest x-ray shows multiple opacities. [facility] team was consulted for admission for further evaluation and management of hypercalcemia and generalized weakness.   Hospital Course:   Following conditions were addressed during hospitalization as listed below,  Hypercalcemia Unknown etiology.Likely malignancy vs multiple myeloma. PTH low, parathyroid related protein <2 vitamin D normal, SPEP with some M spike, UPEP pending. will discontinue vitamin D on discharge.  Received IV fluids. After discussion with oncology, patient received IV Zometa 4 mg once, improving calcium levels.    Will need to monitor calcium as outpatient.  AKI on CKD stage IIIb Creatinine around 1.8, on admission 2.34.  Will discontinue Spironolactone.  Bumex as needed only.  Will need to monitor BMP as outpatient.  Hypotension Improved with IV fluids.  On metoprolol.    Continue  Generalized weakness/fall/debility. Likely multifactorial from acute kidney injury, hypercalcemia, hypertension.  Physical therapy has seen the patient and recommend skilled nursing facility placement.    Pulmonary nodules likely lung malignancy Multiple soft tissue mass/nodules on  head. CT chest showed numerous  bilateral pulmonary nodules suspicious for metastatic disease. CT abdomen/pelvis without contrast showed pathologic retroperitoneal, bilateral iliac and bilateral inguinal adenopathy consistent with metastatic disease. Discussed with oncology Dr. [name] on 12/17/2019, recommend noninvasive investigations to determine the primary malignancy. LDH elevated, flow cytometry pending(Dr [name] will follow up result and inform family).Previous provider had discussed with son, no further heroic measures  Hypothyroidism . TSH low 0.189, free T4 high 1.36. Continue synthroid 75 mcg daily   Chronic diastolic HF/aortic valve stenosis Bumex on hold for now due to AKI and dehydration.  Resume as needed for weight gain.  Persistent A. Fib with RVR Heart rate controlled. Continue metoprolol 50 mg twice daily.  Has been started on Eliquis.  Will continue on discharge.  Dysphagia Seen by speech therapy.  Recommend dysphagia 2 diet.  Goals of care discussion Patient with very poor prognosis. Palliative care consulted. Plan is SNF with possibleoutpatient palliative/hospice support if patient continues to decline.  Disposition.  At this time, patient is stable for disposition to skilled nursing facility.  Please consider palliative care after discharge.  Medical Consultants:    oncology  Procedures:    none Subjective:   Today, patient was seen and examined at bedside.  Denies shortness of breath cough, fever difficulty swallowing nausea vomiting.  Discharge Exam:   Vitals:   12/23/19 0433 12/23/19 0800  BP: (!) 119/56 (!) 123/62  Pulse: 68 75  Resp: 23 17  Temp: 98.1 F (36.7 C) 98.1 F (36.7 C)  SpO2: 94% 95%   Vitals:   12/22/19 2156 12/22/19 2300 12/23/19 0433 12/23/19 0800  BP: (!) 119/49 (!) 114/52 (!) 119/56 (!) 123/62  Pulse: 63 63 68 75  Resp: '21 20 23 17  '$ Temp:  98.1 F (36.7 C) 98.1 F (36.7 C) 98.1 F (36.7 C)  TempSrc:  Oral Oral Oral  SpO2: 95% 95% 94% 95%    Weight:   58 kg   Height:        General: Alert awake, not in obvious distress, thinly built, elderly female HENT: pupils equally reacting to light,  No scleral pallor or icterus noted. Oral mucosa is moist.  Chest:    Diminished breath sounds bilaterally. No crackles or wheezes.  CVS: S1 &S2 heard. No murmur.  Regular rate and rhythm. Abdomen: Soft, nontender, nondistended.  Bowel sounds are heard.   Extremities: No cyanosis, clubbing or edema.  Peripheral pulses are palpable. Psych: Alert, awake and oriented, normal mood CNS:  No cranial nerve deficits.  Generalized weakness.  Power equal in all extremities Skin: Warm and dry.  No rashes noted.  The results of significant diagnostics from this hospitalization (including imaging, microbiology, ancillary and laboratory) are listed below for reference.     Diagnostic Studies:   CT ABDOMEN PELVIS WO CONTRAST  Result Date: 12/16/2019 CLINICAL DATA:  Abdominal distension, weakness, bilateral pulmonary nodules suspicious for metastatic disease EXAM: CT ABDOMEN AND PELVIS WITHOUT CONTRAST TECHNIQUE: Multidetector CT imaging of the abdomen and pelvis was performed following the standard protocol without IV contrast. Unenhanced CT was performed per clinician order. Lack of IV contrast limits sensitivity and specificity, especially for evaluation of abdominal/pelvic solid viscera. COMPARISON:  12/23/2017, 12/15/2019 FINDINGS: Lower chest: Multiple bilateral pulmonary nodules are again noted consistent with metastatic disease. Small left pleural effusion is unchanged. There is extensive calcification of the mitral and aortic valves. Diffuse atherosclerosis throughout the coronary vessels and descending thoracic aorta. Hepatobiliary: Calcified gallstone is identified without cholecystitis. No focal  liver abnormalities identified on this unenhanced exam. Pancreas: Unremarkable. No pancreatic ductal dilatation or surrounding inflammatory changes. Spleen:  Normal in size without focal abnormality. Adrenals/Urinary Tract: There is a punctate less than 2 mm nonobstructing right renal calculus reference image 39. No left-sided calculi. No obstructive uropathy. Evaluation of bladder is limited due to severe streak artifact from right hip arthroplasty. The adrenals are unremarkable. Stomach/Bowel: No bowel obstruction or ileus. No wall thickening or inflammatory change. Vascular/Lymphatic: There is extensive atherosclerosis throughout the abdominal aorta and its branches. There is pathologic retroperitoneal lymphadenopathy. Index lymph node in the left para-aortic region measures 12 mm in short axis reference image 32/3. Matted adenopathy extends along the bilateral iliac distributions, left greater than right. Left external iliac chain lymph node on image 66 measures 26 mm in short axis. There is marked bilateral inguinal adenopathy, with largest lymph node mass in the left inguinal region measuring 4.7 x 6.3 cm. Reproductive: Uterus is markedly atrophic. Ovaries are not well visualized and are likely atrophic. There is a cystic structure adjacent to the right adnexa overlying the right iliac vessels, measuring 2.3 x 2.8 cm. This could reflect a cyst of right ovarian origin versus necrotic adenopathy. Other: There is no free fluid or free gas. No abdominal wall hernia. Musculoskeletal: Right hip arthroplasty appears unremarkable. There are no acute or destructive bony lesions. Reconstructed images demonstrate no additional findings. IMPRESSION: 1. Pathologic retroperitoneal, bilateral iliac, and bilateral inguinal adenopathy, consistent with metastatic disease. The left inguinal lymph node mass is the most accessible location for percutaneous biopsy should tissue diagnosis be desired. 2. Cholelithiasis without cholecystitis. 3. Punctate less than 2 mm nonobstructing right renal calculus. 4. A 2.8 cm cystic structure adjacent to the right adnexa overlying the right iliac  vessels. This could reflect a cyst of right ovarian origin versus necrotic adenopathy. 5. Extensive pulmonary metastases again noted, with stable left pleural effusion. 6. Aortic Atherosclerosis (ICD10-I70.0). Electronically Signed   By: Randa Ngo M.D.   On: 12/16/2019 19:29   CT Head Wo Contrast  Result Date: 12/15/2019 CLINICAL DATA:  Dizziness EXAM: CT HEAD WITHOUT CONTRAST TECHNIQUE: Contiguous axial images were obtained from the base of the skull through the vertex without intravenous contrast. COMPARISON:  None. FINDINGS: Brain: There is no acute intracranial hemorrhage, mass effect, or edema. No acute appearing loss of gray-white differentiation. There is no extra-axial fluid collection. Chronic infarction involving primarily the right parietotemporal lobes and insula patchy and confluent areas of hypoattenuation in the supratentorial white matter are nonspecific but probably reflect moderate chronic microvascular ischemic changes. Prominence of the ventricles and sulci reflects generalized parenchymal volume loss. Vascular: There is atherosclerotic calcification at the skull base. Skull: Calvarium is unremarkable. Sinuses/Orbits: No acute finding. Other: Few scalp soft tissue masses, largest in the lateral right frontal region. IMPRESSION: No acute intracranial abnormality. Chronic right MCA territory infarction. Moderate chronic microvascular ischemic changes. Several scalp soft tissue masses, largest in the right lateral frontal region. Please correlate with history and exam. Electronically Signed   By: Macy Mis M.D.   On: 12/15/2019 13:07   CT CHEST WO CONTRAST  Result Date: 12/15/2019 CLINICAL DATA:  Abnormal x-ray. Lung opacities. Generalized weakness for 6 days. Decreased appetite. EXAM: CT CHEST WITHOUT CONTRAST TECHNIQUE: Multidetector CT imaging of the chest was performed following the standard protocol without IV contrast. COMPARISON:  12/15/2019 FINDINGS: Cardiovascular: Heart  is mildly enlarged. There is no pericardial effusion. There is extensive atherosclerotic calcification of the coronary arteries and mitral annulus.  Extensive atherosclerotic calcification of the thoracic aorta is present. No associated aneurysm. Mediastinum/Nodes: The visualized portion of the thyroid gland has a normal appearance. Pretracheal lymph node is 1.1 centimeters. Precarinal lymph node is 1.0 centimeters. No significant hilar adenopathy. Esophagus is unremarkable. Lungs/Pleura: There are numerous nodules throughout the lungs. Nodules involve all lobes and measure from 3 millimeters to 2.0 cm. A 2.0 centimeter nodule in the LEFT UPPER lobe on image 78 of series 4.) there are small bilateral pleural effusions, LEFT greater than RIGHT. Bibasilar atelectasis is present. No airspace filling opacities to indicate infectious process. Upper Abdomen: Noncontrast appearance of the liver is unremarkable. No UPPER abdominal abnormality. Musculoskeletal: Moderate degenerative changes are seen throughout the thoracic spine. No suspicious lytic or blastic lesions are identified. Remote rib fractures. Remote LEFT shoulder arthroplasty. IMPRESSION: 1. Numerous bilateral pulmonary nodules, suspicious for metastatic disease. 2. Small bilateral pleural effusions, LEFT greater than RIGHT. 3. Cardiomegaly and coronary artery disease. 4. Aortic Atherosclerosis (ICD10-I70.0). Electronically Signed   By: Nolon Nations M.D.   On: 12/15/2019 16:51   DG Chest Port 1 View  Result Date: 12/15/2019 CLINICAL DATA:  Generalized weakness. EXAM: PORTABLE CHEST 1 VIEW COMPARISON:  05/30/2019 FINDINGS: The cardio pericardial silhouette is enlarged. Interstitial markings are diffusely coarsened with chronic features. Patchy/nodular airspace disease is seen in the mid and lower lungs bilaterally. Left base collapse/consolidation with small left pleural effusion again noted. Bones are diffusely demineralized. Status post left shoulder  replacement. Telemetry leads overlie the chest. IMPRESSION: 1. Patchy/nodular airspace disease in the mid and lower lungs bilaterally. Imaging features may be related to pulmonary edema although multifocal pneumonia a concern. Given nodular character in some regions, chest CT may be warranted to further evaluate. 2. Left base collapse/consolidation with small left pleural effusion, similar to prior. Electronically Signed   By: Misty Stanley M.D.   On: 12/15/2019 10:36     Labs:   Basic Metabolic Panel: Recent Labs  Lab 12/19/19 0335 12/19/19 0335 12/20/19 0308 12/20/19 0308 12/21/19 0234 12/21/19 0234 12/22/19 0414 12/23/19 0230  NA 147*  --  146*  --  145  --  143 141  K 3.4*   < > 3.6   < > 3.7   < > 4.0 3.7  CL 114*  --  115*  --  113*  --  112* 108  CO2 25  --  24  --  25  --  22 23  GLUCOSE 132*  --  101*  --  107*  --  79 114*  BUN 45*  --  41*  --  38*  --  35* 38*  CREATININE 1.91*  --  1.68*  --  1.54*  --  1.60* 1.89*  CALCIUM 12.2*  --  11.3*  --  11.2*  --  10.6* 10.7*  MG  --   --   --   --   --   --   --  2.2  PHOS  --   --   --   --   --   --   --  3.5   < > = values in this interval not displayed.   GFR Estimated Creatinine Clearance: 15.1 mL/min (A) (by C-G formula based on SCr of 1.89 mg/dL (H)). Liver Function Tests: Recent Labs  Lab 12/18/19 0249 12/19/19 0335 12/20/19 0308 12/21/19 0234 12/23/19 0230  AST '26 25 28 '$ 35 31  ALT '17 17 21 26 25  '$ ALKPHOS 79 81 72 76 105  BILITOT 1.4* 1.0 1.1 1.2 1.8*  PROT 5.2* 5.3* 4.9* 4.8* 5.2*  ALBUMIN 2.5* 2.5* 2.3* 2.3* 2.4*   No results for input(s): LIPASE, AMYLASE in the last 168 hours. No results for input(s): AMMONIA in the last 168 hours. Coagulation profile No results for input(s): INR, PROTIME in the last 168 hours.  CBC: Recent Labs  Lab 12/19/19 0335 12/20/19 0308 12/21/19 0234 12/22/19 0414 12/23/19 0230  WBC 7.2 8.2 7.5 8.7 9.0  NEUTROABS 4.4 5.7 6.3 8.4* 6.2  HGB 9.9* 9.5* 9.6* 10.1* 10.7*   HCT 31.9* 31.4* 31.6* 33.3* 34.6*  MCV 94.1 94.0 95.2 93.8 93.8  PLT 182 175 181 184 211   Cardiac Enzymes: No results for input(s): CKTOTAL, CKMB, CKMBINDEX, TROPONINI in the last 168 hours. BNP: Invalid input(s): POCBNP CBG: No results for input(s): GLUCAP in the last 168 hours. D-Dimer No results for input(s): DDIMER in the last 72 hours. Hgb A1c No results for input(s): HGBA1C in the last 72 hours. Lipid Profile No results for input(s): CHOL, HDL, LDLCALC, TRIG, CHOLHDL, LDLDIRECT in the last 72 hours. Thyroid function studies No results for input(s): TSH, T4TOTAL, T3FREE, THYROIDAB in the last 72 hours.  Invalid input(s): FREET3 Anemia work up No results for input(s): VITAMINB12, FOLATE, FERRITIN, TIBC, IRON, RETICCTPCT in the last 72 hours. Microbiology Recent Results (from the past 240 hour(s))  Culture, blood (routine x 2)     Status: None   Collection Time: 12/15/19  2:22 PM   Specimen: BLOOD  Result Value Ref Range Status   Specimen Description BLOOD BLOOD LEFT FOREARM  Final   Special Requests   Final    BOTTLES DRAWN AEROBIC AND ANAEROBIC Blood Culture adequate volume   Culture   Final    NO GROWTH 5 DAYS Performed at [facility], [address], [place], [place] [address]    Report Status 12/20/2019 FINAL  Final  Culture, blood (routine x 2)     Status: None   Collection Time: 12/15/19  2:26 PM   Specimen: BLOOD  Result Value Ref Range Status   Specimen Description BLOOD RIGHT ANTECUBITAL  Final   Special Requests   Final    BOTTLES DRAWN AEROBIC AND ANAEROBIC Blood Culture results may not be optimal due to an inadequate volume of blood received in culture bottles   Culture   Final    NO GROWTH 5 DAYS Performed at [facility], [place] [address], [place], [place] [address]    Report Status 12/20/2019 FINAL  Final  SARS Coronavirus 2 by RT PCR (hospital order, performed in [facility] hospital lab) Nasopharyngeal Nasopharyngeal Swab     Status:  None   Collection Time: 12/15/19  2:46 PM   Specimen: Nasopharyngeal Swab  Result Value Ref Range Status   SARS Coronavirus 2 NEGATIVE NEGATIVE Final    Comment: (NOTE) SARS-CoV-2 target nucleic acids are NOT DETECTED.  The SARS-CoV-2 RNA is generally detectable in upper and lower respiratory specimens during the acute phase of infection. The lowest concentration of SARS-CoV-2 viral copies this assay can detect is 250 copies / mL. A negative result does not preclude SARS-CoV-2 infection and should not be used as the sole basis for treatment or other patient management decisions.  A negative result may occur with improper specimen collection / handling, submission of specimen other than nasopharyngeal swab, presence of viral mutation(s) within the areas targeted by this assay, and inadequate number of viral copies (<250 copies / mL). A negative result  must be combined with clinical observations, patient history, and epidemiological information.  Fact Sheet for Patients:   StrictlyIdeas.no  Fact Sheet for Healthcare Providers: BankingDealers.co.za  This test is not yet approved or  cleared by the [place] FDA and has been authorized for detection and/or diagnosis of SARS-CoV-2 by FDA under an Emergency Use Authorization (EUA).  This EUA will remain in effect (meaning this test can be used) for the duration of the COVID-19 declaration under Section 564(b)(1) of the Act, 21 U.S.C. section 360bbb-3(b)(1), unless the authorization is terminated or revoked sooner.  Performed at [facility], [place] [address] [place], [place] [address]   SARS CORONAVIRUS 2 (TAT 6-24 HRS) Nasopharyngeal Nasopharyngeal Swab     Status: None   Collection Time: 12/20/19  3:35 PM   Specimen: Nasopharyngeal Swab  Result Value Ref Range Status   SARS Coronavirus 2 NEGATIVE NEGATIVE Final    Comment: (NOTE) SARS-CoV-2 target nucleic acids are NOT  DETECTED.  The SARS-CoV-2 RNA is generally detectable in upper and lower respiratory specimens during the acute phase of infection. Negative results do not preclude SARS-CoV-2 infection, do not rule out co-infections with other pathogens, and should not be used as the sole basis for treatment or other patient management decisions. Negative results must be combined with clinical observations, patient history, and epidemiological information. The expected result is Negative.  Fact Sheet for Patients: SugarRoll.be  Fact Sheet for Healthcare Providers: https://www.woods-mathews.com/  This test is not yet approved or cleared by the [place] FDA and  has been authorized for detection and/or diagnosis of SARS-CoV-2 by FDA under an Emergency Use Authorization (EUA). This EUA will remain  in effect (meaning this test can be used) for the duration of the COVID-19 declaration under Se ction 564(b)(1) of the Act, 21 U.S.C. section 360bbb-3(b)(1), unless the authorization is terminated or revoked sooner.  Performed at [facility], [place] [address] [place], [place] [address]      Discharge Instructions:   Discharge Instructions    Diet - low sodium heart healthy   Complete by: As directed    Discharge instructions   Complete by: As directed    Follow up with your primary care provider at the skilled nursing facility in 3-5 days.   Increase activity slowly   Complete by: As directed      Allergies as of 12/23/2019      Reactions   [name] [ethacrynic Acid] Nausea And Vomiting   Red Dye    Furosemide Rash   Tolerates Bumex   Sulfa Antibiotics Itching, Rash   Tape Rash, Other (See Comments)   Tears skin off.  Please use "paper" tape only.   [name] Rash   Per Dr Sallyanne Kuster, had a skin reaction with thiazides prior to use of Lasix   Torsemide Rash   Tolerates Bumex      Medication List    STOP taking these  medications   Acetaminophen PM 25-500 MG Tabs tablet Generic drug: diphenhydramine-acetaminophen   magnesium hydroxide 400 MG/5ML suspension Commonly known as: MILK OF MAGNESIA   spironolactone 25 MG tablet Commonly known as: ALDACTONE   Vitamin D 50 MCG (2000 UT) tablet     TAKE these medications   acetaminophen 650 MG CR tablet Commonly known as: TYLENOL Take 650 mg by mouth 3 (three) times daily. Morning, supper and bedtime   apixaban 2.5 MG Tabs tablet Commonly known as: ELIQUIS Take 1 tablet (2.5 mg total) by mouth 2 (two)  times daily.   atorvastatin 40 MG tablet Commonly known as: LIPITOR TAKE 1 TABLET DAILY AT 6 P.M. What changed: See the new instructions.   benzonatate 100 MG capsule Commonly known as: TESSALON Take 100 mg by mouth at bedtime.   bumetanide 1 MG tablet Commonly known as: BUMEX Take 1 tablet (1 mg total) by mouth daily as needed (for wt gain of 2 pounds in a day or 5 pounds in a week, swelling or shortness of breath). Start taking on: December 25, 2019 What changed: These instructions start on December 25, 2019. If you are unsure what to do until then, ask your doctor or other care provider.   ezetimibe 10 MG tablet Commonly known as: ZETIA TAKE 1 TABLET(10 MG) BY MOUTH DAILY What changed: See the new instructions.   folic acid 1 MG tablet Commonly known as: FOLVITE TAKE 2 TABLETS(2 MG) BY MOUTH DAILY What changed: See the new instructions.   ipratropium 0.03 % nasal spray Commonly known as: ATROVENT Place 2 sprays into both nostrils 3 (three) times daily as needed for rhinitis.   levothyroxine 50 MCG tablet Commonly known as: SYNTHROID Take 1 tablet (50 mcg total) by mouth daily.   loratadine 10 MG tablet Commonly known as: CLARITIN Take 10 mg by mouth daily.   metoprolol tartrate 25 MG tablet Commonly known as: LOPRESSOR Take 1 tablet (25 mg total) by mouth 2 (two) times daily.   multivitamin-iron-minerals-folic acid chewable  tablet Chew 1 tablet by mouth daily.   polyethylene glycol 17 g packet Commonly known as: MIRALAX / GLYCOLAX Take 17 g by mouth daily as needed for mild constipation or moderate constipation.   senna 8.6 MG Tabs tablet Commonly known as: SENOKOT Take 1 tablet (8.6 mg total) by mouth 2 (two) times daily.   triamcinolone cream 0.1 % Commonly known as: KENALOG APPLY CREAM EXTERNALLY TO AFFECTED AREA TWICE DAILY AS NEEDED (NOT FACE GROIN OR UNDERARMS) What changed:   how much to take  how to take this  when to take this  reasons to take this   vitamin C 500 MG tablet Commonly known as: ASCORBIC ACID Take 500 mg by mouth daily.       Time coordinating discharge: 39 minutes  Signed:  Devyn Sheerin  [facility] 12/23/2019, 11:03 AM

## 2019-12-23 NOTE — Progress Notes (Signed)
Pt discharging via [facility] to SNF [facility].  Report called and AVS printed.  Will inform son of transfer.

## 2019-12-23 NOTE — TOC Transition Note (Signed)
Transition of Care [facility]) - CM/SW Discharge Note   Patient Details  Name: [name] MRN: [id number] Date of Birth: May 11, 1924  Transition of Care [facility]) CM/SW Contact:  Vinie Sill, [place] Phone Number: 12/23/2019, 3:03 PM   Clinical Narrative:     Patient will DC to: [facility]  DC Date:12/23/2019 Family Notified: Barney,son Transport [id number]   Per MD patient is ready for discharge. RN, patient, and facility notified of DC. Discharge Summary sent to facility. RN given number for [phone]. Ambulance transport requested for patient.   Clinical Social Worker signing off.  Thurmond Butts, MSW, [place] Clinical Social Worker    Final next level of care: Skilled Nursing Facility Barriers to Discharge: Barriers Resolved   Patient Goals and CMS Choice        Discharge Placement              Patient chooses bed at:  ([facility]) Patient to be transferred to facility by: [facility] Name of family member notified: Sheran Spine, son Patient and family notified of of transfer: 12/23/19  Discharge Plan and Services In-house Referral: Clinical Social Work                                   Social Determinants of Health (SDOH) Interventions     Readmission Risk Interventions No flowsheet data found.

## 2019-12-23 NOTE — TOC Progression Note (Addendum)
Transition of Care [facility]) - Progression Note    Patient Details  Name: [name] MRN: [id number] Date of Birth: Mar 13, 1924  Transition of Care [facility]) CM/SW [place], LCSW Phone Number: 12/23/2019, 9:18 AM  Clinical Narrative:    CSW received consult for palliative follow up at [facility]. [facility] will arrange Palliative follow up once patient arrives through [place]. Patient's son aware of discharge today and requests [facility] for transport. He has patient's COVID vaccine card for patient to have visitors at [facility].    Expected Discharge Plan: Skilled Nursing Facility Barriers to Discharge: SNF Pending bed offer, Insurance Authorization  Expected Discharge Plan and Services Expected Discharge Plan: [place] In-house Referral: Clinical Social Work     Living arrangements for the past 2 months: Single Family Home                                       Social Determinants of Health (SDOH) Interventions    Readmission Risk Interventions No flowsheet data found.

## 2019-12-24 DIAGNOSIS — R5381 Other malaise: Secondary | ICD-10-CM | POA: Diagnosis not present

## 2019-12-24 DIAGNOSIS — C8590 Non-Hodgkin lymphoma, unspecified, unspecified site: Secondary | ICD-10-CM | POA: Diagnosis not present

## 2019-12-24 DIAGNOSIS — I5032 Chronic diastolic (congestive) heart failure: Secondary | ICD-10-CM | POA: Diagnosis not present

## 2019-12-24 DIAGNOSIS — I130 Hypertensive heart and chronic kidney disease with heart failure and stage 1 through stage 4 chronic kidney disease, or unspecified chronic kidney disease: Secondary | ICD-10-CM | POA: Diagnosis not present

## 2019-12-24 DIAGNOSIS — N1832 Chronic kidney disease, stage 3b: Secondary | ICD-10-CM | POA: Diagnosis not present

## 2019-12-27 DIAGNOSIS — G479 Sleep disorder, unspecified: Secondary | ICD-10-CM | POA: Diagnosis not present

## 2019-12-27 DIAGNOSIS — R7989 Other specified abnormal findings of blood chemistry: Secondary | ICD-10-CM | POA: Diagnosis not present

## 2019-12-27 DIAGNOSIS — I959 Hypotension, unspecified: Secondary | ICD-10-CM | POA: Diagnosis not present

## 2019-12-28 DIAGNOSIS — G479 Sleep disorder, unspecified: Secondary | ICD-10-CM | POA: Diagnosis not present

## 2019-12-28 DIAGNOSIS — Z79899 Other long term (current) drug therapy: Secondary | ICD-10-CM | POA: Diagnosis not present

## 2019-12-28 DIAGNOSIS — R52 Pain, unspecified: Secondary | ICD-10-CM | POA: Diagnosis not present

## 2019-12-29 DIAGNOSIS — Z7901 Long term (current) use of anticoagulants: Secondary | ICD-10-CM | POA: Diagnosis not present

## 2019-12-29 DIAGNOSIS — Z882 Allergy status to sulfonamides status: Secondary | ICD-10-CM | POA: Diagnosis not present

## 2019-12-29 DIAGNOSIS — R410 Disorientation, unspecified: Secondary | ICD-10-CM | POA: Diagnosis not present

## 2019-12-29 DIAGNOSIS — Z743 Need for continuous supervision: Secondary | ICD-10-CM | POA: Diagnosis not present

## 2019-12-29 DIAGNOSIS — C865 Angioimmunoblastic T-cell lymphoma: Secondary | ICD-10-CM | POA: Diagnosis not present

## 2019-12-29 DIAGNOSIS — C7951 Secondary malignant neoplasm of bone: Secondary | ICD-10-CM | POA: Diagnosis not present

## 2019-12-29 DIAGNOSIS — Z888 Allergy status to other drugs, medicaments and biological substances status: Secondary | ICD-10-CM | POA: Diagnosis not present

## 2019-12-29 DIAGNOSIS — C799 Secondary malignant neoplasm of unspecified site: Secondary | ICD-10-CM | POA: Diagnosis not present

## 2019-12-29 DIAGNOSIS — Z955 Presence of coronary angioplasty implant and graft: Secondary | ICD-10-CM | POA: Diagnosis not present

## 2019-12-29 DIAGNOSIS — J90 Pleural effusion, not elsewhere classified: Secondary | ICD-10-CM | POA: Diagnosis not present

## 2019-12-29 DIAGNOSIS — I499 Cardiac arrhythmia, unspecified: Secondary | ICD-10-CM | POA: Diagnosis not present

## 2019-12-29 DIAGNOSIS — N179 Acute kidney failure, unspecified: Secondary | ICD-10-CM | POA: Diagnosis not present

## 2019-12-29 DIAGNOSIS — Z96642 Presence of left artificial hip joint: Secondary | ICD-10-CM | POA: Diagnosis not present

## 2019-12-29 DIAGNOSIS — E039 Hypothyroidism, unspecified: Secondary | ICD-10-CM | POA: Diagnosis not present

## 2019-12-29 DIAGNOSIS — R600 Localized edema: Secondary | ICD-10-CM | POA: Diagnosis not present

## 2019-12-29 DIAGNOSIS — K8020 Calculus of gallbladder without cholecystitis without obstruction: Secondary | ICD-10-CM | POA: Diagnosis not present

## 2019-12-29 DIAGNOSIS — Z91048 Other nonmedicinal substance allergy status: Secondary | ICD-10-CM | POA: Diagnosis not present

## 2019-12-29 DIAGNOSIS — R6889 Other general symptoms and signs: Secondary | ICD-10-CM | POA: Diagnosis not present

## 2019-12-29 DIAGNOSIS — Z515 Encounter for palliative care: Secondary | ICD-10-CM | POA: Diagnosis not present

## 2019-12-29 DIAGNOSIS — E785 Hyperlipidemia, unspecified: Secondary | ICD-10-CM | POA: Diagnosis not present

## 2019-12-29 DIAGNOSIS — J189 Pneumonia, unspecified organism: Secondary | ICD-10-CM | POA: Diagnosis not present

## 2019-12-29 DIAGNOSIS — R531 Weakness: Secondary | ICD-10-CM | POA: Diagnosis not present

## 2019-12-29 DIAGNOSIS — J168 Pneumonia due to other specified infectious organisms: Secondary | ICD-10-CM | POA: Diagnosis not present

## 2019-12-29 DIAGNOSIS — R918 Other nonspecific abnormal finding of lung field: Secondary | ICD-10-CM | POA: Diagnosis not present

## 2019-12-29 DIAGNOSIS — Z79899 Other long term (current) drug therapy: Secondary | ICD-10-CM | POA: Diagnosis not present

## 2019-12-29 DIAGNOSIS — I2510 Atherosclerotic heart disease of native coronary artery without angina pectoris: Secondary | ICD-10-CM | POA: Diagnosis not present

## 2019-12-29 DIAGNOSIS — N189 Chronic kidney disease, unspecified: Secondary | ICD-10-CM | POA: Diagnosis not present

## 2019-12-29 DIAGNOSIS — R160 Hepatomegaly, not elsewhere classified: Secondary | ICD-10-CM | POA: Diagnosis not present

## 2019-12-30 DIAGNOSIS — I959 Hypotension, unspecified: Secondary | ICD-10-CM | POA: Diagnosis not present

## 2019-12-30 DIAGNOSIS — R000 Tachycardia, unspecified: Secondary | ICD-10-CM | POA: Diagnosis not present

## 2019-12-30 DIAGNOSIS — E039 Hypothyroidism, unspecified: Secondary | ICD-10-CM | POA: Diagnosis not present

## 2019-12-30 DIAGNOSIS — Z515 Encounter for palliative care: Secondary | ICD-10-CM | POA: Diagnosis not present

## 2019-12-30 DIAGNOSIS — C8590 Non-Hodgkin lymphoma, unspecified, unspecified site: Secondary | ICD-10-CM | POA: Diagnosis not present

## 2019-12-30 DIAGNOSIS — R7981 Abnormal blood-gas level: Secondary | ICD-10-CM | POA: Diagnosis not present

## 2019-12-30 DIAGNOSIS — N179 Acute kidney failure, unspecified: Secondary | ICD-10-CM | POA: Diagnosis not present

## 2019-12-31 ENCOUNTER — Ambulatory Visit: Payer: Medicare Other | Admitting: Adult Health

## 2020-01-07 ENCOUNTER — Ambulatory Visit: Payer: Medicare Other | Admitting: Adult Health

## 2020-01-18 ENCOUNTER — Telehealth: Payer: Self-pay | Admitting: Family Medicine

## 2020-01-18 NOTE — Telephone Encounter (Signed)
Sympathy card mailed 

## 2020-01-24 ENCOUNTER — Inpatient Hospital Stay: Payer: Medicare Other | Admitting: Hematology and Oncology

## 2020-01-25 ENCOUNTER — Ambulatory Visit: Payer: Medicare Other | Admitting: Cardiovascular Disease

## 2020-01-26 DEATH — deceased

## 2020-04-25 ENCOUNTER — Other Ambulatory Visit (HOSPITAL_COMMUNITY): Payer: Medicare Other

## 2020-05-03 ENCOUNTER — Ambulatory Visit: Payer: Medicare Other | Admitting: Cardiovascular Disease

## 2020-11-21 ENCOUNTER — Encounter (INDEPENDENT_AMBULATORY_CARE_PROVIDER_SITE_OTHER): Payer: Medicare Other | Admitting: Ophthalmology
